# Patient Record
Sex: Female | Born: 1975
Health system: Southern US, Community
[De-identification: ages and names within clinical notes are randomized; demographics above are authoritative.]

## PROBLEM LIST (undated history)

## (undated) DIAGNOSIS — I10 Essential (primary) hypertension: Secondary | ICD-10-CM

## (undated) DIAGNOSIS — F32A Depression, unspecified: Secondary | ICD-10-CM

## (undated) DIAGNOSIS — Z923 Personal history of irradiation: Secondary | ICD-10-CM

## (undated) DIAGNOSIS — J189 Pneumonia, unspecified organism: Secondary | ICD-10-CM

## (undated) DIAGNOSIS — C50919 Malignant neoplasm of unspecified site of unspecified female breast: Secondary | ICD-10-CM

## (undated) DIAGNOSIS — F329 Major depressive disorder, single episode, unspecified: Secondary | ICD-10-CM

## (undated) DIAGNOSIS — E669 Obesity, unspecified: Secondary | ICD-10-CM

## (undated) DIAGNOSIS — J302 Other seasonal allergic rhinitis: Secondary | ICD-10-CM

## (undated) DIAGNOSIS — F419 Anxiety disorder, unspecified: Secondary | ICD-10-CM

## (undated) DIAGNOSIS — K219 Gastro-esophageal reflux disease without esophagitis: Secondary | ICD-10-CM

## (undated) DIAGNOSIS — D573 Sickle-cell trait: Secondary | ICD-10-CM

## (undated) DIAGNOSIS — Z332 Encounter for elective termination of pregnancy: Secondary | ICD-10-CM

## (undated) HISTORY — DX: Depression, unspecified: F32.A

## (undated) HISTORY — PX: PORTACATH PLACEMENT: SHX2246

## (undated) HISTORY — DX: Anxiety disorder, unspecified: F41.9

## (undated) HISTORY — DX: Major depressive disorder, single episode, unspecified: F32.9

## (undated) HISTORY — DX: Malignant neoplasm of unspecified site of unspecified female breast: C50.919

## (undated) HISTORY — DX: Personal history of irradiation: Z92.3

## (undated) HISTORY — DX: Sickle-cell trait: D57.3

## (undated) HISTORY — DX: Obesity, unspecified: E66.9

---

## 1998-09-16 ENCOUNTER — Emergency Department (HOSPITAL_COMMUNITY): Admission: EM | Admit: 1998-09-16 | Discharge: 1998-09-16 | Payer: Self-pay | Admitting: Emergency Medicine

## 1999-02-27 ENCOUNTER — Emergency Department (HOSPITAL_COMMUNITY): Admission: EM | Admit: 1999-02-27 | Discharge: 1999-02-27 | Payer: Self-pay | Admitting: Emergency Medicine

## 1999-06-04 ENCOUNTER — Emergency Department (HOSPITAL_COMMUNITY): Admission: EM | Admit: 1999-06-04 | Discharge: 1999-06-04 | Payer: Self-pay | Admitting: Emergency Medicine

## 2000-01-16 ENCOUNTER — Inpatient Hospital Stay (HOSPITAL_COMMUNITY): Admission: AD | Admit: 2000-01-16 | Discharge: 2000-01-16 | Payer: Self-pay | Admitting: Obstetrics

## 2000-05-14 ENCOUNTER — Emergency Department (HOSPITAL_COMMUNITY): Admission: EM | Admit: 2000-05-14 | Discharge: 2000-05-14 | Payer: Self-pay | Admitting: Emergency Medicine

## 2001-08-04 ENCOUNTER — Inpatient Hospital Stay (HOSPITAL_COMMUNITY): Admission: AD | Admit: 2001-08-04 | Discharge: 2001-08-04 | Payer: Self-pay | Admitting: *Deleted

## 2001-08-11 ENCOUNTER — Inpatient Hospital Stay (HOSPITAL_COMMUNITY): Admission: AD | Admit: 2001-08-11 | Discharge: 2001-08-11 | Payer: Self-pay | Admitting: Obstetrics

## 2001-10-19 ENCOUNTER — Other Ambulatory Visit: Admission: RE | Admit: 2001-10-19 | Discharge: 2001-10-19 | Payer: Self-pay | Admitting: Obstetrics and Gynecology

## 2002-03-10 ENCOUNTER — Inpatient Hospital Stay (HOSPITAL_COMMUNITY): Admission: AD | Admit: 2002-03-10 | Discharge: 2002-03-10 | Payer: Self-pay | Admitting: *Deleted

## 2002-03-10 ENCOUNTER — Encounter: Payer: Self-pay | Admitting: *Deleted

## 2002-03-11 ENCOUNTER — Inpatient Hospital Stay (HOSPITAL_COMMUNITY): Admission: AD | Admit: 2002-03-11 | Discharge: 2002-03-13 | Payer: Self-pay | Admitting: *Deleted

## 2003-05-30 ENCOUNTER — Encounter: Payer: Self-pay | Admitting: Obstetrics & Gynecology

## 2003-05-30 ENCOUNTER — Inpatient Hospital Stay (HOSPITAL_COMMUNITY): Admission: AD | Admit: 2003-05-30 | Discharge: 2003-06-05 | Payer: Self-pay | Admitting: Obstetrics & Gynecology

## 2003-06-01 ENCOUNTER — Encounter: Payer: Self-pay | Admitting: Obstetrics and Gynecology

## 2003-06-10 ENCOUNTER — Inpatient Hospital Stay (HOSPITAL_COMMUNITY): Admission: AD | Admit: 2003-06-10 | Discharge: 2003-06-10 | Payer: Self-pay | Admitting: *Deleted

## 2005-05-29 ENCOUNTER — Inpatient Hospital Stay (HOSPITAL_COMMUNITY): Admission: AD | Admit: 2005-05-29 | Discharge: 2005-05-30 | Payer: Self-pay | Admitting: *Deleted

## 2005-06-06 ENCOUNTER — Inpatient Hospital Stay (HOSPITAL_COMMUNITY): Admission: AD | Admit: 2005-06-06 | Discharge: 2005-06-06 | Payer: Self-pay | Admitting: Obstetrics & Gynecology

## 2006-03-22 ENCOUNTER — Inpatient Hospital Stay (HOSPITAL_COMMUNITY): Admission: AD | Admit: 2006-03-22 | Discharge: 2006-03-23 | Payer: Self-pay | Admitting: Obstetrics and Gynecology

## 2006-04-27 ENCOUNTER — Ambulatory Visit: Payer: Self-pay | Admitting: Gynecology

## 2006-05-04 ENCOUNTER — Ambulatory Visit: Payer: Self-pay | Admitting: *Deleted

## 2006-05-16 ENCOUNTER — Ambulatory Visit: Payer: Self-pay | Admitting: Obstetrics and Gynecology

## 2006-05-16 ENCOUNTER — Inpatient Hospital Stay (HOSPITAL_COMMUNITY): Admission: AD | Admit: 2006-05-16 | Discharge: 2006-05-17 | Payer: Self-pay | Admitting: *Deleted

## 2006-05-17 ENCOUNTER — Ambulatory Visit: Payer: Self-pay | Admitting: Gynecology

## 2006-05-17 ENCOUNTER — Inpatient Hospital Stay (HOSPITAL_COMMUNITY): Admission: AD | Admit: 2006-05-17 | Discharge: 2006-05-20 | Payer: Self-pay | Admitting: Gynecology

## 2006-05-23 ENCOUNTER — Ambulatory Visit: Payer: Self-pay | Admitting: *Deleted

## 2007-02-12 ENCOUNTER — Emergency Department (HOSPITAL_COMMUNITY): Admission: EM | Admit: 2007-02-12 | Discharge: 2007-02-12 | Payer: Self-pay | Admitting: Emergency Medicine

## 2012-05-19 ENCOUNTER — Emergency Department (HOSPITAL_COMMUNITY): Payer: Self-pay

## 2012-05-19 ENCOUNTER — Emergency Department (HOSPITAL_COMMUNITY)
Admission: EM | Admit: 2012-05-19 | Discharge: 2012-05-20 | Disposition: A | Discharge status: Home / Self Care | Payer: Self-pay | Attending: Emergency Medicine | Admitting: Emergency Medicine

## 2012-05-19 ENCOUNTER — Encounter (HOSPITAL_COMMUNITY): Payer: Self-pay | Admitting: *Deleted

## 2012-05-19 DIAGNOSIS — F17200 Nicotine dependence, unspecified, uncomplicated: Secondary | ICD-10-CM | POA: Insufficient documentation

## 2012-05-19 DIAGNOSIS — R079 Chest pain, unspecified: Secondary | ICD-10-CM | POA: Insufficient documentation

## 2012-05-19 DIAGNOSIS — I10 Essential (primary) hypertension: Secondary | ICD-10-CM | POA: Insufficient documentation

## 2012-05-19 HISTORY — DX: Essential (primary) hypertension: I10

## 2012-05-19 LAB — POCT I-STAT, CHEM 8
BUN: 8 mg/dL (ref 6–23)
Calcium, Ion: 1.23 mmol/L (ref 1.12–1.32)
Chloride: 105 mEq/L (ref 96–112)
HCT: 45 % (ref 36.0–46.0)
Potassium: 3.7 mEq/L (ref 3.5–5.1)
Sodium: 144 mEq/L (ref 135–145)

## 2012-05-19 LAB — D-DIMER, QUANTITATIVE: D-Dimer, Quant: 0.57 ug/mL-FEU — ABNORMAL HIGH (ref 0.00–0.48)

## 2012-05-19 LAB — CBC
Platelets: 231 10*3/uL (ref 150–400)
RBC: 4.76 MIL/uL (ref 3.87–5.11)
RDW: 13.8 % (ref 11.5–15.5)
WBC: 16 10*3/uL — ABNORMAL HIGH (ref 4.0–10.5)

## 2012-05-19 LAB — POCT I-STAT TROPONIN I: Troponin i, poc: 0 ng/mL (ref 0.00–0.08)

## 2012-05-19 MED ORDER — IOHEXOL 350 MG/ML SOLN
100.0000 mL | Freq: Once | INTRAVENOUS | Status: AC | PRN
  Administered 2012-05-19: 100 mL via INTRAVENOUS

## 2012-05-19 MED ORDER — IBUPROFEN 600 MG PO TABS: 600.0000 mg | ORAL_TABLET | Freq: Four times a day (QID) | ORAL | Status: AC | PRN

## 2012-05-19 NOTE — Discharge Instructions (Signed)
Follow up with your providers as dicussed in the ED today and as written above.  See your doctor immediately--or return to the ED--with any new or troubling symptoms including fevers, weakness, new chest pain, shortness or breath, numbness, or any other concerning symptom.    Chest Pain (Nonspecific)  It is often hard to give a specific diagnosis for the cause of chest pain. There is always a chance that your pain could be related to something serious, such as a heart attack or a blood clot in the lungs. You need to follow up with your caregiver for further evaluation. CAUSES   Heartburn.   Pneumonia or bronchitis.   Anxiety or stress.   Inflammation around your heart (pericarditis) or lung (pleuritis or pleurisy).   A blood clot in the lung.   A collapsed lung (pneumothorax). It can develop suddenly on its own (spontaneous pneumothorax) or from injury (trauma) to the chest.   Shingles infection (herpes zoster virus).  The chest wall is composed of bones, muscles, and cartilage. Any of these can be the source of the pain.  The bones can be bruised by injury.   The muscles or cartilage can be strained by coughing or overwork.   The cartilage can be affected by inflammation and become sore (costochondritis).  DIAGNOSIS  Lab tests or other studies, such as X-rays, electrocardiography, stress testing, or cardiac imaging, may be needed to find the cause of your pain.  TREATMENT   Treatment depends on what may be causing your chest pain. Treatment may include:   Acid blockers for heartburn.   Anti-inflammatory medicine.   Pain medicine for inflammatory conditions.   Antibiotics if an infection is present.   You may be advised to change lifestyle habits. This includes stopping smoking and avoiding alcohol, caffeine, and chocolate.   You may be advised to keep your head raised (elevated) when sleeping. This reduces the chance of acid going backward from your stomach into your  esophagus.   Most of the time, nonspecific chest pain will improve within 2 to 3 days with rest and mild pain medicine.  HOME CARE INSTRUCTIONS   If antibiotics were prescribed, take your antibiotics as directed. Finish them even if you start to feel better.   For the next few days, avoid physical activities that bring on chest pain. Continue physical activities as directed.   Do not smoke.   Avoid drinking alcohol.   Only take over-the-counter or prescription medicine for pain, discomfort, or fever as directed by your caregiver.   Follow your caregiver's suggestions for further testing if your chest pain does not go away.   Keep any follow-up appointments you made. If you do not go to an appointment, you could develop lasting (chronic) problems with pain. If there is any problem keeping an appointment, you must call to reschedule.  SEEK MEDICAL CARE IF:   You think you are having problems from the medicine you are taking. Read your medicine instructions carefully.   Your chest pain does not go away, even after treatment.   You develop a rash with blisters on your chest.  SEEK IMMEDIATE MEDICAL CARE IF:   You have increased chest pain or pain that spreads to your arm, neck, jaw, back, or abdomen.   You develop shortness of breath, an increasing cough, or you are coughing up blood.   You have severe back or abdominal pain, feel nauseous, or vomit.   You develop severe weakness, fainting, or chills.   You   have a fever.  THIS IS AN EMERGENCY. Do not wait to see if the pain will go away. Get medical help at once. Call your local emergency services (911 in [place] Do not drive yourself to the hospital. MAKE SURE YOU:   Understand these instructions.   Will watch your condition.   Will get help right away if you are not doing well or get worse.  Document Released: 08/31/2005 Document Revised: 11/10/2011 Document Reviewed: 06/26/2008 [employer] Patient Information 2012 [employer]. 

## 2012-05-19 NOTE — ED Notes (Signed)
[facility] and her left chest started to feel tight and she did experience nausea and shortness of breath.  Patient is pain free at this time.  Patient also mentioned that her nose stated to bleed.  Has history of nosebleed and uncontrolled HTN

## 2012-05-19 NOTE — ED Provider Notes (Signed)
History     CSN: [id number]  Arrival date & time 05/19/12  1729   None     Chief Complaint  Patient presents with  . Chest Pain    (Consider location/radiation/quality/duration/timing/severity/associated sxs/prior treatment) HPI  36 year old female past medical history of hypertension presents today with a acute onset of chest tightness and shortness of breath and nausea. Onset was during walking approximately 5 hours before arrival. It the discomfort lasted for about one hour and her source of breath was better with resting. She denied a jaw pain she endorsed some right arm tingling but she's had that for years. After the initial onset she had one round of emesis. She is symptom-free at this time.   Past Medical History  Diagnosis Date  . Hypertension     History reviewed. No pertinent past surgical history.  History reviewed. No pertinent family history.  History  Substance Use Topics  . Smoking status: Current Everyday Smoker -- 0.5 packs/day    Types: Cigarettes  . Smokeless tobacco: Not on file  . Alcohol Use: No    OB History    Grav Para Term Preterm Abortions TAB SAB Ect Mult Living                  Review of Systems Constitutional: Negative for fever and chills.  HENT: Negative for ear pain, sore throat and trouble swallowing.   Eyes: Negative for pain and visual disturbance.  Respiratory: Negative for cough and POS shortness of breath.   Cardiovascular: POS for chest pain and leg swelling.  Gastrointestinal: Negative for nausea, vomiting, abdominal pain and diarrhea.  Genitourinary: Negative for dysuria, urgency and frequency.  Musculoskeletal: Negative for back pain and joint swelling.  Skin: Negative for rash and wound.  Neurological: Negative for dizziness, syncope, speech difficulty, weakness and numbness.   Allergies  Review of patient's allergies indicates no known allergies.  Home Medications   Current Outpatient Rx  Name Route Sig Dispense  Refill  . IBUPROFEN 600 MG PO TABS Oral Take 1 tablet (600 mg total) by mouth every 6 (six) hours as needed for pain. 15 tablet 0    BP 120/84  Pulse 100  Temp 98.8 F (37.1 C) (Oral)  Resp 16  Ht 5\' 2"  (1.575 m)  Wt 224 lb (101.606 kg)  BMI 40.97 kg/m2  SpO2 97%  LMP 04/29/2012  Physical Exam Consitutional: Pt in no acute distress.   Head: Normocephalic and atraumatic.  Eyes: Extraocular motion intact, no scleral icterus Neck: Supple without meningismus, mass, or overt JVD Respiratory: Effort normal and breath sounds normal. No respiratory distress. CV: Heart regular rate and regular rhythm (sinus), no obvious murmurs.  Pulses +2 and symmetric Abdomen: Soft, non-tender, non-distended. No rebound or guarding.  MSK: Extremities are atraumatic without deformity, ROM intact Skin: Warm, dry, intact Neuro: Alert and oriented, no motor deficit noted.   Psychiatric: Mood and affect are normal  EKG:  Rate: 98 Rythym Sinus  Interval 134  ms. Axis: borderline LAD No gross conduction abnormalities appreciated.  No gross ST change.  Some t wave flattening III, V3 No previous.   ED Course  Procedures (including critical care time)  Labs Reviewed  CBC - Abnormal; Notable for the following:    WBC 16.0 (*)     All other components within normal limits  POCT I-STAT, CHEM 8 - Abnormal; Notable for the following:    Hemoglobin 15.3 (*)     All other components within normal limits  D-DIMER, QUANTITATIVE - Abnormal; Notable for the following:    D-Dimer, Quant 0.57 (*)     All other components within normal limits  POCT I-STAT TROPONIN I   Dg Chest 2 View  05/19/2012  *RADIOLOGY REPORT*  Clinical Data: Chest pain and pressure; shortness of breath.  CHEST - 2 VIEW  Comparison: None.  Findings: The lungs are well-aerated and clear.  There is no evidence of focal opacification, pleural effusion or pneumothorax.  The heart is normal in size; the mediastinal contour is within normal  limits.  No acute osseous abnormalities are seen.  IMPRESSION: No acute cardiopulmonary process seen.  Original Report Authenticated By: Tonia Ghent, M.D.   Ct Angio Chest W/cm &/or Wo Cm  05/19/2012  *RADIOLOGY REPORT*  Clinical Data: Mid chest pain and shortness of breath; elevated D- dimer.  CT ANGIOGRAPHY CHEST  Technique:  Multidetector CT imaging of the chest using the standard protocol during bolus administration of intravenous contrast. Multiplanar reconstructed images including MIPs were obtained and reviewed to evaluate the vascular anatomy.  Contrast: OMNIPAQUE IOHEXOL 350 MG/ML SOLN  Comparison: Chest radiograph performed earlier today at 09:34 p.m.  Findings: There is no evidence of pulmonary embolus.  The lungs are clear bilaterally, aside from minimal right basilar atelectasis.  There is no evidence of significant focal consolidation, pleural effusion or pneumothorax.  No masses are identified; no abnormal focal contrast enhancement is seen.  The mediastinum is unremarkable in appearance.  No mediastinal lymphadenopathy is seen.  No pericardial effusion is identified. The great vessels are unremarkable in appearance.  No axillary lymphadenopathy is seen.  The visualized portions of the thyroid gland are unremarkable in appearance.  The visualized portions of the liver and spleen are unremarkable.  No acute osseous abnormalities are seen.  IMPRESSION:  1.  No evidence of pulmonary embolus. 2.  Minimal right basilar atelectasis; lungs otherwise clear.  Original Report Authenticated By: Tonia Ghent, M.D.     1. Chest pain       MDM  36 year old female with very few risk factors for acute coronary syndrome. TIMI 0. Troponin drawn by triage. Pain for just over an hour, negative troponin.  Essentially normal EKG.  We'll not workup ACS any further. However right leg swelling and story concerning for PE. D-dimer test drawn, positive. CT chest, negative. Patient pain and shortness of  breath had resolved on arrival; the patient is still pain free without any symptoms.    Discharge home to follow up with her provider.  PT DC home stable.  Discussed with pt the clinical impression, treatment in the ED, and follow up plan.  We alslo discussed the indications for returning to the ED, which include shortness or breath, confusion, fever, new weakness or numbness, chest pain, or any other concerning symptom.  The pt understood the treatment and plan, is stable, and is able to leave the ED.           Larrie Kass, MD 05/20/12 0010

## 2012-05-20 NOTE — ED Provider Notes (Signed)
  I performed a history and physical examination of Rebecca [name] and discussed her management with Dr. Rainey Pines.  I agree with the history, physical, assessment, and plan of care, with the following exceptions: None  On my evaluation the patient was resting comfortably, in no distress.  The patient's right leg was minimally in enlarged compared to her left.  Patient's vital signs are stable.  I also saw the ECG and agree with the interpretation.  The patient was discharged in stable condition to follow up with her primary care physician.  Elyse Jarvis, MD 05/20/12 [phone]

## 2014-08-14 ENCOUNTER — Emergency Department (HOSPITAL_COMMUNITY)
Admission: EM | Admit: 2014-08-14 | Discharge: 2014-08-14 | Disposition: A | Discharge status: Home / Self Care | Payer: Medicaid Other | Attending: Emergency Medicine | Admitting: Emergency Medicine

## 2014-08-14 ENCOUNTER — Encounter (HOSPITAL_COMMUNITY): Payer: Self-pay | Admitting: Emergency Medicine

## 2014-08-14 DIAGNOSIS — M7989 Other specified soft tissue disorders: Secondary | ICD-10-CM | POA: Diagnosis present

## 2014-08-14 DIAGNOSIS — I10 Essential (primary) hypertension: Secondary | ICD-10-CM | POA: Diagnosis not present

## 2014-08-14 DIAGNOSIS — F17200 Nicotine dependence, unspecified, uncomplicated: Secondary | ICD-10-CM | POA: Diagnosis not present

## 2014-08-14 DIAGNOSIS — L97809 Non-pressure chronic ulcer of other part of unspecified lower leg with unspecified severity: Secondary | ICD-10-CM | POA: Insufficient documentation

## 2014-08-14 DIAGNOSIS — L98491 Non-pressure chronic ulcer of skin of other sites limited to breakdown of skin: Secondary | ICD-10-CM

## 2014-08-14 LAB — CBG MONITORING, ED: Glucose-Capillary: 90 mg/dL (ref 70–99)

## 2014-08-14 MED ORDER — HYDROCODONE-ACETAMINOPHEN 5-325 MG PO TABS
1.0000 | ORAL_TABLET | ORAL | Status: DC | PRN
Start: 2014-08-14 — End: 2016-01-13

## 2014-08-14 MED ORDER — CEPHALEXIN 500 MG PO CAPS: 500.0000 mg | ORAL_CAPSULE | Freq: Three times a day (TID) | ORAL | Status: DC

## 2014-08-14 MED ORDER — SULFAMETHOXAZOLE-TRIMETHOPRIM 800-160 MG PO TABS: 1.0000 | ORAL_TABLET | Freq: Two times a day (BID) | ORAL | Status: DC

## 2014-08-14 NOTE — Discharge Instructions (Signed)
Please follow up for a wound check Skin Ulcer A skin ulcer is an open sore that can be shallow or deep. Skin ulcers sometimes become infected and are difficult to treat. It may be 1 month or longer before real healing progress is made. CAUSES   Injury.  Problems with the veins or arteries.  Diabetes.  Insect bites.  Bedsores.  Inflammatory conditions. SYMPTOMS   Pain, redness, swelling, and tenderness around the ulcer.  Fever.  Bleeding from the ulcer.  Yellow or clear fluid coming from the ulcer. DIAGNOSIS  There are many types of skin ulcers. Any open sores will be examined. Certain tests will be done to determine the kind of ulcer you have. The right treatment depends on the type of ulcer you have. TREATMENT  Treatment is a long-term challenge. It may include:  Wearing an elastic wrap, compression stockings, or gel cast over the ulcer area.  Taking antibiotic medicines or putting antibiotic creams on the affected area if there is an infection. HOME CARE INSTRUCTIONS  Put on your bandages (dressings), wraps, or casts over the ulcer as directed by your caregiver.  Change all dressings as directed by your caregiver.  Take all medicines as directed by your caregiver.  Keep the affected area clean and dry.  Avoid injuries to the affected area.  Eat a well-balanced, healthy diet that includes plenty of fruit and vegetables.  If you smoke, consider quitting or decreasing the amount of cigarettes you smoke.  Once the ulcer heals, get regular exercise as directed by your caregiver.  Work with your caregiver to make sure your blood pressure, cholesterol, and diabetes are well-controlled.  Keep your skin moisturized. Dry skin can crack and lead to skin ulcers. SEEK IMMEDIATE MEDICAL CARE IF:   Your pain gets worse.  You have swelling, redness, or fluids around the ulcer.  You have chills.  You have a fever. MAKE SURE YOU:   Understand these  instructions.  Will watch your condition.  Will get help right away if you are not doing well or get worse. Document Released: 12/29/2004 Document Revised: 02/13/2012 Document Reviewed: 07/08/2011 [facility] Patient Information 2015 [place], [place]. This information is not intended to replace advice given to you by your health care provider. Make sure you discuss any questions you have with your health care provider.

## 2014-08-14 NOTE — ED Notes (Signed)
Pt reports she was bitten by a spider . Area to Rt lower leg red an swollen .

## 2014-08-14 NOTE — ED Provider Notes (Signed)
CSN: [id number]     Arrival date & time 08/14/14  0950 History   This chart was scribed for non-physician practitioner Margarita Mail working with Houston Siren III, * by Donato Schultz, ED Scribe. This patient was seen in room TR07C/TR07C and the patient's care was started at 10:33 AM.     Chief Complaint  Patient presents with  . Insect Bite    HPI HPI Comments: Rebecca [name] is a 38 y.o. female who presents to the Emergency Department complaining of a painful spider bite located on her right lower leg she noticed a week ago.  She states that she felt something crawling up her leg at that time and brushed it off.  Shortly thereafter she felt some pain and inflammation to her right lower leg.  She denies fever, chills, and myalgias as associated symptoms.  She has applied topical Neosporin to the area with no relief to her symptoms.  She has not experienced these symptoms in the past.     Past Medical History  Diagnosis Date  . Hypertension    Past Surgical History  Procedure Laterality Date  . Cesarean section      2003   History reviewed. No pertinent family history. History  Substance Use Topics  . Smoking status: Current Every Day Smoker -- 0.50 packs/day    Types: Cigarettes  . Smokeless tobacco: Never Used  . Alcohol Use: No   OB History   Grav Para Term Preterm Abortions TAB SAB Ect Mult Living                 Review of Systems  Constitutional: Negative for fever and chills.  Musculoskeletal: Negative for myalgias.  Skin: Positive for wound.      Allergies  Review of patient's allergies indicates no known allergies.  Home Medications   Prior to Admission medications   Not on File   BP 106/71  Pulse 81  Temp(Src) 97.7 F (36.5 C) (Oral)  Resp 12  Ht 5' 1.5" (1.562 m)  Wt 224 lb (101.606 kg)  BMI 41.64 kg/m2  SpO2 99%  LMP 08/14/2014  Physical Exam  Nursing note and vitals reviewed. Constitutional: She is oriented to person, place, and time. She  appears well-developed and well-nourished.  HENT:  Head: Normocephalic and atraumatic.  Eyes: EOM are normal.  Neck: Normal range of motion.  Cardiovascular: Normal rate.   Pulmonary/Chest: Effort normal.  Musculoskeletal: Normal range of motion.  Neurological: She is alert and oriented to person, place, and time.  Skin: Skin is warm and dry.  Psychiatric: She has a normal mood and affect. Her behavior is normal.      ED Course  Wound repair Date/Time: 08/18/2014 9:48 PM Performed by: Margarita Mail Authorized by: Margarita Mail Consent: Verbal consent obtained. Risks and benefits: risks, benefits and alternatives were discussed Patient identity confirmed: verbally with patient Time out: Immediately prior to procedure a "time out" was called to verify the correct patient, procedure, equipment, support staff and site/side marked as required. Preparation: Patient was prepped and draped in the usual sterile fashion. Local anesthesia used: yes Local anesthetic: lidocaine 1% with epinephrine (9:1 lidocaine  and  4 % bicarb) Anesthetic total: 4 ml Patient sedated: no Patient tolerance: Patient tolerated the procedure well with no immediate complications.   (including critical care time)  DIAGNOSTIC STUDIES: Oxygen Saturation is 99% on room air, normal by my interpretation.    COORDINATION OF CARE: 10:35 AM- Discussed starting the patient on an antibiotic  and doing an ultrasound of her right lower leg to determine if there is any fluid present under the wound.  The patient agreed to the treatment plan.    Labs Review Labs Reviewed - No data to display  Imaging Review No results found.   EKG Interpretation None      MDM   Final diagnoses:  Skin ulcer, limited to breakdown of skin    Patient with skin ulcer.  Wound was anesthetized and debrided. No signs of infeciton. Normal pulseses and cbg wnl.   I personally performed the services described in this  documentation, which was scribed in my presence. The recorded information has been reviewed and is accurate.    Margarita Mail, PA-C 08/18/14 2151

## 2014-08-19 NOTE — ED Provider Notes (Signed)
Medical screening examination/treatment/procedure(s) were performed by non-physician practitioner and as supervising physician I was immediately available for consultation/collaboration.   Houston Siren III, MD 08/19/14 1700

## 2015-10-13 ENCOUNTER — Emergency Department (HOSPITAL_COMMUNITY)
Admission: EM | Admit: 2015-10-13 | Discharge: 2015-10-13 | Disposition: A | Discharge status: Home / Self Care | Payer: Medicaid Other | Attending: Emergency Medicine | Admitting: Emergency Medicine

## 2015-10-13 ENCOUNTER — Encounter (HOSPITAL_COMMUNITY): Payer: Self-pay

## 2015-10-13 DIAGNOSIS — N6310 Unspecified lump in the right breast, unspecified quadrant: Secondary | ICD-10-CM

## 2015-10-13 DIAGNOSIS — Z720 Tobacco use: Secondary | ICD-10-CM | POA: Diagnosis not present

## 2015-10-13 DIAGNOSIS — Z792 Long term (current) use of antibiotics: Secondary | ICD-10-CM | POA: Insufficient documentation

## 2015-10-13 DIAGNOSIS — N63 Unspecified lump in breast: Secondary | ICD-10-CM | POA: Diagnosis present

## 2015-10-13 DIAGNOSIS — I10 Essential (primary) hypertension: Secondary | ICD-10-CM | POA: Insufficient documentation

## 2015-10-13 MED ORDER — CEPHALEXIN 500 MG PO CAPS: 500.0000 mg | ORAL_CAPSULE | Freq: Four times a day (QID) | ORAL | Status: DC

## 2015-10-13 NOTE — ED Provider Notes (Signed)
CSN: [id number]     Arrival date & time 10/13/15  1236 History  By signing my name below, I, Erling Conte, attest that this documentation has been prepared under the direction and in the presence of Carlos Levering, PA-C Electronically Signed: Erling Conte, ED Scribe. 10/13/2015. 1:25 PM.    Chief Complaint  Patient presents with  . Breast Mass    The history is provided by the patient. No language interpreter was used.    HPI Comments: Rebecca [name] is a 39 y.o. female with a h/o HTN who presents to the Emergency Department complaining of lump in her right breast onset 2 weeks. She reports associated pain and mild itching in her right breast. She states she has not had a mammogram in over 10 years. Pt has not had any medications prior to arrival. Pt reports the pain in her right breast is exacerbated with touch. She states that breast cancer runs in her family and notes her maternal grandmother died from breast cancer and was diagnosed in her 64s. Pt denies any h/o lumps in her breasts. She states she gives herself daily breast checks. Pt endorses that she has 4 children and her last child was born when she was 12 y.o. She reports her LNMP was 10/06/15. She denies any fevers, nipple discharge, abdominal pain, abdominal distension, night sweats, or unexplained weight loss.   Past Medical History  Diagnosis Date  . Hypertension    Past Surgical History  Procedure Laterality Date  . Cesarean section      2003   No family history on file. Social History  Substance Use Topics  . Smoking status: Current Every Day Smoker -- 0.50 packs/day    Types: Cigarettes  . Smokeless tobacco: Never Used  . Alcohol Use: No   OB History    No data available     Review of Systems  All other systems reviewed and are negative.     Allergies  Review of patient's allergies indicates no known allergies.  Home Medications   Prior to Admission medications   Medication Sig Start Date End  Date Taking? Authorizing Provider  cephALEXin (KEFLEX) 500 MG capsule Take 1 capsule (500 mg total) by mouth 3 (three) times daily. 08/14/14   Margarita Mail, PA-C  HYDROcodone-acetaminophen (NORCO) 5-325 MG per tablet Take 1-2 tablets by mouth every 4 (four) hours as needed. 08/14/14   Margarita Mail, PA-C  sulfamethoxazole-trimethoprim (SEPTRA DS) 800-160 MG per tablet Take 1 tablet by mouth every 12 (twelve) hours. 08/14/14   Margarita Mail, PA-C   Triage Vitals: BP 126/83 mmHg  Pulse 84  Temp(Src) 98.3 F (36.8 C) (Oral)  Resp 18  SpO2 100%  Physical Exam  Constitutional: She is oriented to person, place, and time. She appears well-developed and well-nourished. No distress.  HENT:  Head: Normocephalic and atraumatic.  Eyes: Conjunctivae are normal. Right eye exhibits no discharge. Left eye exhibits no discharge. No scleral icterus.  Cardiovascular: Normal rate.   Pulmonary/Chest: Effort normal. Right breast exhibits mass and tenderness. Right breast exhibits no inverted nipple, no nipple discharge and no skin change. Left breast exhibits no inverted nipple, no mass, no nipple discharge, no skin change and no tenderness. Breasts are symmetrical.    Genitourinary: Uterus normal.  Neurological: She is alert and oriented to person, place, and time. Coordination normal.  Skin: Skin is warm and dry. No rash noted. She is not diaphoretic. No erythema. No pallor.  Psychiatric: She has a normal mood and affect.  Her behavior is normal.  Nursing note and vitals reviewed.   ED Course  Procedures (including critical care time)  DIAGNOSTIC STUDIES: Oxygen Saturation is 100% on RA, normal by my interpretation.    COORDINATION OF CARE: 1:45 PM- Will provide pt with referral for outpatient [place]. Will also give her rx for Keflex. Pt advised of plan for treatment and pt agrees.  Labs Review Labs Reviewed - No data to display  Imaging Review No results found. I have personally reviewed and  evaluated these images and lab results as part of my medical decision-making.   EKG Interpretation None      MDM   Final diagnoses:  Breast mass, right    39 year old otherwise healthy female presents with tender mass in her right breast. Patient noticed this last 2 weeks ago. It is becoming increasingly tender. No skin changes or galactorrhea. No unintentional weight loss or night sweats. Positive family history for breast cancer, maternal grandmother. Recommend that patient get complete ultrasound of this mass. We'll refer patient to [facility] as she does not a primary care.   Mass does not feel fluctuant on physical exam. However as the area is quite tender Will prophylactically give antibiotics. Discussed treatment plan patient is agreeable. Patient stable for discharge. Return precautions outlined in patient discharge instructions. I personally performed the services described in this documentation, which was scribed in my presence. The recorded information has been reviewed and is accurate.      Dondra Spry [place], PA-C 10/14/15 1751  Daleen Bo, MD 10/15/15 [phone]

## 2015-10-13 NOTE — ED Notes (Signed)
Right breast mass, upper right side of breast. Onset 2 weeks ago. States lump is tender.

## 2015-10-13 NOTE — Discharge Instructions (Signed)
Breast Biopsy A breast biopsy is a procedure where a sample of breast tissue is removed from your breast. The tissue is examined under a microscope to see if cancerous cells are present. A breast biopsy is done when there is:  Any undiagnosed breast mass (tumor).  Nipple abnormalities, dimpling, crusting, or ulcerations.  Abnormal discharge from the nipple, especially blood.  Redness, swelling, and pain of the breast.  Calcium deposits (calcifications) or abnormalities seen on a mammogram, ultrasound result, or results of magnetic resonance imaging (MRI).  Suspicious changes in the breast seen on your mammogram. If the tumor is found to be cancerous (malignant), a breast biopsy can help to determine what the best treatment is for you. There are many different types of breast biopsies. Talk to your caregiver about your options and which type is best for you. LET YOUR CAREGIVER KNOW ABOUT:  Allergies to food or medicine.  Medicines taken, including vitamins, herbs, eyedrops, over-the-counter medicines, and creams.  Use of steroids (by mouth or creams).  Previous problems with anesthetics or numbing medicines.  History of bleeding problems or blood clots.  Previous surgery.  Other health problems, including diabetes and kidney problems.  Any recent colds or infections.  Possibility of pregnancy, if this applies. RISKS AND COMPLICATIONS   Bleeding.  Infection.  Allergy to medicines.  Bruising and swelling of the breast.  Alteration in the shape of the breast.  Not finding the lump or abnormality.  Needing more surgery. BEFORE THE PROCEDURE  Arrange for someone to drive you home after the procedure.  Do not smoke for 2 weeks before the procedure. Stop smoking, if you smoke.  Do not drink alcohol for 24 hours before procedure.  Wear a good support bra to the procedure.  Your health care provider may perform a procedure to place a wire (needle localization) or a  seed that gives off radiation (radioactive seed localization) in the breast lump. A mammogram or ultrasound is done during this procedure to help with proper placement. The wire or seed will help the health care provider locate the lump when performing the biopsy, especially if the lump cannot be felt. PROCEDURE  You may be given a medicine to numb the breast area (local anesthesia) or a medicine to make you sleep (general anesthesia) during the procedure. The following are the different types of biopsies that can be performed.   Fine-needle aspiration--A thin needle is attached to a syringe and inserted into the breast lump. Fluid and cells are removed and then looked at under a microscope. If the breast lump cannot be felt, an ultrasound may be used to help locate the lump and place the needle in the correct area.   Core needle biopsy--A wide, hollow needle (core needle) is inserted into the breast lump 3-6 times to get tissue samples or cores. The samples are removed. The needle is usually placed in the correct area by using an ultrasound or X-ray.   Stereotactic biopsy--X-ray equipment and a computer are used to analyze X-ray pictures of the breast lump. The computer then finds exactly where the core needle needs to be inserted. Tissue samples are removed.   Vacuum-assisted biopsy--A small incision (less than  inch) is made in your breast. A biopsy device that includes a hollow needle and vacuum is passed through the incision and into the breast tissue. The vacuum gently draws abnormal breast tissue into the needle to remove it. This type of biopsy removes a larger tissue sample than a regular  core needle biopsy. No stitches are needed, and there is usually little scarring.  Ultrasound-guided core needle biopsy--A high frequency ultrasound helps guide the core needle to the area of the mass or abnormality. An incision is made to insert the needle. Tissue samples are removed.  Open biopsy--A  larger incision is made in the breast. Your caregiver will attempt to remove the whole breast lump or as much as possible. AFTER THE PROCEDURE  You will be taken to the recovery area. If you are doing well and have no problems, you will be allowed to go home.  You may notice bruising on your breast. This is normal.  Your caregiver may apply a pressure dressing on your breast for 24-48 hours. A pressure dressing is a bandage that is wrapped tightly around the chest to stop fluid from collecting underneath tissues.   This information is not intended to replace advice given to you by your health care provider. Make sure you discuss any questions you have with your health care provider.   Document Released: 11/21/2005 Document Revised: 08/12/2015 Document Reviewed: 12/22/2011 Elsevier Interactive Patient Education 2016 [name] Breast self-awareness allows you to notice a breast problem early while it is still small. Do a breast self-exam:  Every month, 5-7 days after your period (menstrual period).  At the same time each month if you do not have periods anymore. Look for any:  Difference between your breasts (size, shape, or position).  Change in breast shape or size.  Fluid or blood coming from your nipples.  Changes in your nipples (dimpling, nipple movement).   Change in skin color or texture (redness, scaly areas). Feel for:  Lumps.  Bumps.  Dips.  Any other changes. HOW TO DO A BREAST SELF-EXAM Look at your breasts and nipples.  Take off all your clothes above your waist.  Stand in front of a mirror in a room with good lighting.  Put your hands on your hips and push your hands downward. Feel your breasts.   Lie flat on your back or stand in the shower or tub. If you are in the shower or tub, have wet, soapy hands.  Place your right arm above your head.  Place your left hand in the right underarm area.  Make small circles using the  pads (not the fingertips) of your 3 middle fingers. Press lightly and then with medium and firm pressure.  Move your fingers a little lower and make the small circles at the 3 pressures (light, medium, and firm).  Continue moving your fingers lower and making circles until you reach the bottom of your breast.  Move your fingers one finger-width towards the center of the body.  Continue making the circles, this time moving upward until you reach the bottom of your neck.  Move your fingers one finger-width towards the center of your body.  Make circles downward when starting at the bottom of the neck. Make circles upward when starting at the bottom of the breast. Stop when you reach the middle of the chest.   Repeat these steps on the other breast. Write down what looks and feels normal for each breast. Also write down any changes you notice. GET HELP RIGHT AWAY IF:  You see any changes in your breasts or nipples.  You see skin changes.  You have unusual discharge from your nipples.  You feel a new lump.  You feel unusually thick areas.   This information is not intended to  replace advice given to you by your health care provider. Make sure you discuss any questions you have with your health care provider.   Document Released: 05/09/2008 Document Revised: 11/07/2012 Document Reviewed: 03/07/2012 Elsevier Interactive Patient Education 2016 [place] is a type of breast tumor that is not cancerous (is benign). These tumors are made up of breast tissue and the tissue that holds breast tissue together (connective tissue). There are several types of fibroadenomas:  Simple fibroadenoma. This is the most common type. It consists of a single type of tissue throughout the tumor.  Complex fibroadenoma. This type of tumor contains more than one kind of tissue or irregular tissue.  Juvenile fibroadenoma. This is a type of tumor that can develop in adolescent  girls. It tends to grow larger over time than other adenomas. A fibroadenoma usually occurs as a single lump, but sometimes there may be more than one lump. Fibroadenomas vary in size. They can occur in one breast or in both breasts. Some fibroadenomas are too small to feel, but a larger one may feel like a firm, smooth lump that moves beneath your fingers. Although fibroadenomas are not cancer, having a fibroadenoma may slightly increase your risk for developing breast cancer in the future. CAUSES The exact cause of fibroadenoma is not known. RISK FACTORS This condition is more likely to develop in:  Women who are 39-72 years of age.  Women of African-American descent. SYMPTOMS A fibroadenoma may not cause any symptoms. These tumors usually do not cause pain unless they grow to a large size. A fibroadenoma may feel like a lump in your breast that is:  Firm.  Round.  Smooth.  Slightly moveable. DIAGNOSIS You may notice a breast lump during a breast self-exam. Your health care provider may discover it during a routine breast exam or mammogram. Your health care provider may suspect fibroadenoma if you have a breast lump that feels firm, round, and smooth and appears smooth on your mammogram. Other tests may be done to confirm the diagnosis, including:  An ultrasound to check for fluid inside the lump (cystic tumor).  A procedure that uses a needle to remove fluid from a cystic tumor. The fluid is then checked under a microscope for cancer cells.  A mammogram to examine a lump that is not cystic (is solid).  A procedure that uses a needle to remove a sample of tissue from the lump (breast biopsy) to examine under a microscope. This test is the only method that can be used to confirm that a tumor is a fibroadenoma and is not cancer. TREATMENT Treatment for this condition may include:  Having breast exams regularly to check for changes in your fibroadenoma.  Having the fibroadenoma  removed. A fibroadenoma may be removed if it is:  Large.  Continuing to grow.  Causing symptoms.  Changing the appearance of your breast.  A juvenile fibroadenoma. These tend to grow large over time. HOME CARE INSTRUCTIONS  If you had a fibroadenoma removed, follow instructions from your health care provider for care after the procedure.  Perform breast self-exams at home as told by your health care provider.  Keep all follow-up visits as told by your health care provider. This is important. SEEK MEDICAL CARE IF:  Your fibroadenoma becomes larger, feels different, or becomes painful.  You find a new breast lump.  You have any changes in the skin that covers your breast. These include:  Dimpling.  Bruising.  Thickening.  Redness.  You have any changes in your nipple.  You have fluid leaking from your nipple.   This information is not intended to replace advice given to you by your health care provider. Make sure you discuss any questions you have with your health care provider.   Document Released: 04/07/2015 Document Reviewed: 04/07/2015 Elsevier Interactive Patient Education 2016 Else[employer] Inc[employer]  Breast Cyst A breast cyst is a sac in the breast that is filled with fluid. Breast cysts are common in women. Women can have one or many cysts. When the breasts contain many cysts, it is usually due to a noncancerous (benign) condition called fibrocystic change. These lumps form under the influence of female hormones (estrogen and progesterone). The lumps are most often located in the upper, outer portion of the breast. They are often more swollen, painful, and tender before your period starts. They usually disappear after menopause, unless you are on hormone therapy.  There are several types of cysts:  Macrocyst. This is a cyst that is about 2 in. (5.1 cm) in diameter.   Microcyst. This is a tiny cyst that you cannot feel but can be seen with a mammogram or an ultrasound.    Galactocele. This is a cyst containing milk that may develop if you suddenly stop breastfeeding.   Sebaceous cyst of the skin. This type of cyst is not in the breast tissue itself. Breast cysts do not increase your risk of breast cancer. However, they must be monitored closely because they can be cancerous.  CAUSES  It is not known exactly what causes a breast cyst to form. Possible causes include:  An overgrowth of milk glands and connective tissue in the breast can block the milk glands, causing them to fill with fluid.   Scar tissue in the breast from previous surgery may block the glands, causing a cyst.  RISK FACTORS Estrogen may influence the development of a breast cyst.  SIGNS AND SYMPTOMS   Feeling a smooth, round, soft lump (like a grape) in the breast that is easily moveable.   Breast discomfort or pain.  Increase in size of the lump before your menstrual period and decrease in its size after your menstrual period.  DIAGNOSIS  A cyst can be felt during a physical exam by your health care provider. A breast X-ray exam (mammogram) and ultrasonography will be done to confirm the diagnosis. Fluid may be removed from the cyst with a needle (fine needle aspiration) to make sure the cyst is not cancerous.  TREATMENT  Treatment may not be necessary. Your health care provider may monitor the cyst to see if it goes away on its own. If treatment is needed, it may include:  Hormone treatment.   Needle aspiration. There is a chance of the cyst coming back after aspiration.   Surgery to remove the whole cyst.  HOME CARE INSTRUCTIONS   Keep all follow-up appointments with your health care provider.  See your health care provider regularly:  Get a yearly exam by your health care provider.  Have a clinical breast exam by a health care provider every 1-3 years if you are 43-71 years of age. After age 41 years, you should have the exam every year.   Get mammogram tests  as directed by your health care provider.   Understand the normal appearance and feel of your breasts and perform breast self-exams.   Only take over-the-counter or prescription medicines as directed by your health care provider.   Wear a supportive bra,  especially when exercising.   Avoid caffeine.   Reduce your salt intake, especially before your menstrual period. Too much salt can cause fluid retention, breast swelling, and discomfort.  SEEK MEDICAL CARE IF:   You feel, or think you feel, a lump in your breast.   You notice that both breasts look or feel different than usual.   Your breast is still causing pain after your menstrual period is over.   You need medicine for breast pain and swelling that occurs with your menstrual period.  SEEK IMMEDIATE MEDICAL CARE IF:   You have severe pain, tenderness, redness, or warmth in your breast.   You have nipple discharge or bleeding.   Your breast lump becomes hard and painful.   You find new lumps or bumps that were not there before.   You feel lumps in your armpit (axilla).   You notice dimpling or wrinkling of the breast or nipple.   You have a fever.  MAKE SURE YOU:  Understand these instructions.  Will watch your condition.  Will get help right away if you are not doing well or get worse.   This information is not intended to replace advice given to you by your health care provider. Make sure you discuss any questions you have with your health care provider.  Follow up with the breast center as soon as possible for evaluation of breast mass. Will need ultrasound and potential biopsy to evaluate. Return to the Emergency Department if you experience fever, worsening breast pain, nipple discharge, unintentional weight loss or night sweats.

## 2015-10-13 NOTE — ED Notes (Signed)
Pt reports lump in right breast that is painful with palpation and itching. Denies discharge. Denies taking anything for pain. Denies CP.

## 2015-11-23 ENCOUNTER — Ambulatory Visit: Payer: Medicaid Other | Attending: Family Medicine

## 2015-12-18 ENCOUNTER — Other Ambulatory Visit: Payer: Self-pay

## 2015-12-18 DIAGNOSIS — N6310 Unspecified lump in the right breast, unspecified quadrant: Secondary | ICD-10-CM

## 2015-12-24 ENCOUNTER — Encounter: Payer: Self-pay | Admitting: *Deleted

## 2015-12-24 NOTE — Progress Notes (Signed)
Rebecca Putman J. Rebecca Laming, RN, BSN, [place] [phone] [facility] consulted to assist with pt obtaining referral for breast mass imaging appointment. Pt is not currently checking into hospital, states she was told by [name] [phone] to go to Urgent Care or ER to obtain referral.  [facility] explained that without being physical pt, ED would not be able to write that referral.  Pt states she does not have PCP but just gained insurance via Medicaid. Elsie set up appointment with Cammie Sickle, NP on 01/12/16 at 1015.  Spoke with pt in lobby and provided brochure with directions and phone number highlighted.  Pt verbalizes understanding of keeping appointment.

## 2016-01-12 ENCOUNTER — Ambulatory Visit (INDEPENDENT_AMBULATORY_CARE_PROVIDER_SITE_OTHER): Payer: Medicaid Other | Admitting: Family Medicine

## 2016-01-12 ENCOUNTER — Encounter: Payer: Self-pay | Admitting: Family Medicine

## 2016-01-12 VITALS — BP 111/75 | HR 90 | Temp 98.4°F | Resp 16 | Ht 61.0 in | Wt 184.0 lb

## 2016-01-12 DIAGNOSIS — Z6834 Body mass index (BMI) 34.0-34.9, adult: Secondary | ICD-10-CM | POA: Diagnosis not present

## 2016-01-12 DIAGNOSIS — L680 Hirsutism: Secondary | ICD-10-CM | POA: Diagnosis not present

## 2016-01-12 DIAGNOSIS — N6310 Unspecified lump in the right breast, unspecified quadrant: Secondary | ICD-10-CM | POA: Insufficient documentation

## 2016-01-12 DIAGNOSIS — R5383 Other fatigue: Secondary | ICD-10-CM

## 2016-01-12 DIAGNOSIS — N926 Irregular menstruation, unspecified: Secondary | ICD-10-CM

## 2016-01-12 DIAGNOSIS — N63 Unspecified lump in breast: Secondary | ICD-10-CM

## 2016-01-12 DIAGNOSIS — F17200 Nicotine dependence, unspecified, uncomplicated: Secondary | ICD-10-CM

## 2016-01-12 LAB — POCT URINALYSIS DIP (DEVICE)
BILIRUBIN URINE: NEGATIVE
Glucose, UA: NEGATIVE mg/dL
KETONES UR: NEGATIVE mg/dL
LEUKOCYTES UA: NEGATIVE
NITRITE: NEGATIVE
Protein, ur: NEGATIVE mg/dL
Specific Gravity, Urine: 1.02 (ref 1.005–1.030)
Urobilinogen, UA: 0.2 mg/dL (ref 0.0–1.0)
pH: 5.5 (ref 5.0–8.0)

## 2016-01-12 LAB — CBC WITH DIFFERENTIAL/PLATELET
BASOS ABS: 0 10*3/uL (ref 0.0–0.1)
Basophils Relative: 0 % (ref 0–1)
EOS PCT: 2 % (ref 0–5)
Eosinophils Absolute: 0.2 10*3/uL (ref 0.0–0.7)
HEMATOCRIT: 37.5 % (ref 36.0–46.0)
Hemoglobin: 12.3 g/dL (ref 12.0–15.0)
LYMPHS ABS: 3 10*3/uL (ref 0.7–4.0)
Lymphocytes Relative: 24 % (ref 12–46)
MCH: 29.9 pg (ref 26.0–34.0)
MCHC: 32.8 g/dL (ref 30.0–36.0)
MCV: 91.2 fL (ref 78.0–100.0)
MPV: 9.7 fL (ref 8.6–12.4)
Monocytes Absolute: 0.6 10*3/uL (ref 0.1–1.0)
Monocytes Relative: 5 % (ref 3–12)
Neutro Abs: 8.5 10*3/uL — ABNORMAL HIGH (ref 1.7–7.7)
Neutrophils Relative %: 69 % (ref 43–77)
Platelets: 266 10*3/uL (ref 150–400)
RBC: 4.11 MIL/uL (ref 3.87–5.11)
RDW: 14.1 % (ref 11.5–15.5)
WBC: 12.3 10*3/uL — ABNORMAL HIGH (ref 4.0–10.5)

## 2016-01-12 LAB — TESTOSTERONE: TESTOSTERONE: 45 ng/dL

## 2016-01-12 LAB — PROLACTIN: Prolactin: 40.4 ng/mL — ABNORMAL HIGH

## 2016-01-12 NOTE — Progress Notes (Signed)
Subjective:    Patient ID: Unknown Rebecca [name], female    DOB: 1976/08/09, 40 y.o.   MRN: [id number]  HPI [name], a 40 year old female with a history of a right breast mass presents to establish care. Patient reports that she has not had a primary provider over the past several years. Patient found a mass in her right breast while doing a monthly self-breast exam.  She was evaluated in the emergency department on 10/12/2016. She says that her maternal grandmother died of breast cancer in her 87s.  Change was noted several months ago. .  Patient's menstrual periods are irregular and are described as heavy. LMP was 01/04/2016. She denies breast discoloration, nipple discharge, abdominal pain, fatigue, weight loss, or pelvic pain. She denies recent hormone therapy. Patient is also a chronic everyday smoker. She has been smoking for greater than 20 years.  Past Medical History  Diagnosis Date  . Hypertension    Past Surgical History  Procedure Laterality Date  . Cesarean section      2003  No Known Allergies  Past Surgical History  Procedure Laterality Date  . Cesarean section      2003   Social History   Social History  . Marital Status: Single    Spouse Name: N/A  . Number of Children: N/A  . Years of Education: N/A   Occupational History  . Not on file.   Social History Main Topics  . Smoking status: Current Every Day Smoker -- 0.50 packs/day    Types: Cigarettes  . Smokeless tobacco: Never Used  . Alcohol Use: Yes     Comment: occ  . Drug Use: No  . Sexual Activity: Not on file   Other Topics Concern  . Not on file   Social History Narrative      Review of Systems  Constitutional: Negative for fever and fatigue.  HENT: Negative.   Eyes: Negative.   Respiratory: Negative.  Negative for shortness of breath.   Cardiovascular: Negative.   Gastrointestinal: Negative.   Endocrine: Negative for cold intolerance, heat intolerance, polydipsia, polyphagia and polyuria.    Genitourinary: Negative.  Negative for pelvic pain.  Musculoskeletal: Negative.   Allergic/Immunologic: Negative.  Negative for immunocompromised state.  Neurological: Negative.   Hematological: Negative.   Psychiatric/Behavioral: Negative.        Objective:   Physical Exam  Constitutional: Vital signs are normal. She appears well-developed and well-nourished. She appears cachectic.  HENT:  Head: Normocephalic and atraumatic.  Right Ear: Hearing, tympanic membrane, external ear and ear canal normal.  Left Ear: Hearing, tympanic membrane, external ear and ear canal normal.  Nose: Nose normal.  Mouth/Throat: Uvula is midline, oropharynx is clear and moist and mucous membranes are normal.  Pulmonary/Chest:    Skin: No rash noted.  Hirsutism to chin     BP 111/75 mmHg  Pulse 90  Temp(Src) 98.4 F (36.9 C) (Oral)  Resp 16  Ht 5\' 1"  (1.549 m)  Wt 184 lb (83.462 kg)  BMI 34.78 kg/m2  LMP 01/04/2016 Assessment & Plan:  1. Breast mass, right Round, raised, movable, non tender to palpation.  - Prolactin - MM Digital Diagnostic Bilat; Future - US BREAST LTD UNI RIGHT INC AXILLA; Future  2. Hirsutism I suspect patient may have increased testosterone levels due to increased hair growth to neck and chest.  - Testosterone  3. Irregular periods/menstrual cycles She may warrant a transvaginal/pelvic ultrasound.  - TSH - Prolactin - Testosterone  4.  BMI 34.0-34.9,adult Recommend a lowfat, low carbohydrate diet divided over 5-6 small meals, increase water intake to 6-8 glasses, and 150 minutes per week of cardiovascular exercise.   - Hemoglobin A1c - Lipid Panel - POCT urinalysis dip (device)  5. Other fatigue - COMPLETE METABOLIC PANEL WITH GFR - CBC with Differential  6. Tobacco dependence Smoking cessation instruction/counseling given: counseled patient on the dangers of tobacco use, advised patient to stop smoking, and reviewed strategies to maximize success    Anelle Parlow M, FNP  The patient was given clear instructions to go to ER or return to medical center if symptoms do not improve, worsen or new problems develop. The patient verbalized understanding. Will notify patient with laboratory results.

## 2016-01-12 NOTE — Patient Instructions (Addendum)
Will follow up with patient by phone with laboratory results.  Placed order for diagnostic screening test  Breast Self-Awareness Practicing breast self-awareness may pick up problems early, prevent significant medical complications, and possibly save your life. By practicing breast self-awareness, you can become familiar with how your breasts look and feel and if your breasts are changing. This allows you to notice changes early. It can also offer you some reassurance that your breast health is good. One way to learn what is normal for your breasts and whether your breasts are changing is to do a breast self-exam. If you find a lump or something that was not present in the past, it is best to contact your caregiver right away. Other findings that should be evaluated by your caregiver include nipple discharge, especially if it is bloody; skin changes or reddening; areas where the skin seems to be pulled in (retracted); or new lumps and bumps. Breast pain is seldom associated with cancer (malignancy), but should also be evaluated by a caregiver. HOW TO PERFORM A BREAST SELF-EXAM The best time to examine your breasts is 5-7 days after your menstrual period is over. During menstruation, the breasts are lumpier, and it may be more difficult to pick up changes. If you do not menstruate, have reached menopause, or had your uterus removed (hysterectomy), you should examine your breasts at regular intervals, such as monthly. If you are breastfeeding, examine your breasts after a feeding or after using a breast pump. Breast implants do not decrease the risk for lumps or tumors, so continue to perform breast self-exams as recommended. Talk to your caregiver about how to determine the difference between the implant and breast tissue. Also, talk about the amount of pressure you should use during the exam. Over time, you will become more familiar with the variations of your breasts and more comfortable with the exam. A  breast self-exam requires you to remove all your clothes above the waist. 1. Look at your breasts and nipples. Stand in front of a mirror in a room with good lighting. With your hands on your hips, push your hands firmly downward. Look for a difference in shape, contour, and size from one breast to the other (asymmetry). Asymmetry includes puckers, dips, or bumps. Also, look for skin changes, such as reddened or scaly areas on the breasts. Look for nipple changes, such as discharge, dimpling, repositioning, or redness. 2. Carefully feel your breasts. This is best done either in the shower or tub while using soapy water or when flat on your back. Place the arm (on the side of the breast you are examining) above your head. Use the pads (not the fingertips) of your three middle fingers on your opposite hand to feel your breasts. Start in the underarm area and use  inch (2 cm) overlapping circles to feel your breast. Use 3 different levels of pressure (light, medium, and firm pressure) at each circle before moving to the next circle. The light pressure is needed to feel the tissue closest to the skin. The medium pressure will help to feel breast tissue a little deeper, while the firm pressure is needed to feel the tissue close to the ribs. Continue the overlapping circles, moving downward over the breast until you feel your ribs below your breast. Then, move one finger-width towards the center of the body. Continue to use the  inch (2 cm) overlapping circles to feel your breast as you move slowly up toward the collar bone (clavicle) near the  base of the neck. Continue the up and down exam using all 3 pressures until you reach the middle of the chest. Do this with each breast, carefully feeling for lumps or changes. 3.  Keep a written record with breast changes or normal findings for each breast. By writing this information down, you do not need to depend only on memory for size, tenderness, or location. Write down  where you are in your menstrual cycle, if you are still menstruating. Breast tissue can have some lumps or thick tissue. However, see your caregiver if you find anything that concerns you.  SEEK MEDICAL CARE IF:  You see a change in shape, contour, or size of your breasts or nipples.   You see skin changes, such as reddened or scaly areas on the breasts or nipples.   You have an unusual discharge from your nipples.   You feel a new lump or unusually thick areas.    This information is not intended to replace advice given to you by your health care provider. Make sure you discuss any questions you have with your health care provider.   Document Released: 11/21/2005 Document Revised: 11/07/2012 Document Reviewed: 03/07/2012 Elsevier Interactive Patient Education [employer].

## 2016-01-13 DIAGNOSIS — F17200 Nicotine dependence, unspecified, uncomplicated: Secondary | ICD-10-CM | POA: Insufficient documentation

## 2016-01-13 DIAGNOSIS — N926 Irregular menstruation, unspecified: Secondary | ICD-10-CM | POA: Insufficient documentation

## 2016-01-13 DIAGNOSIS — L680 Hirsutism: Secondary | ICD-10-CM | POA: Insufficient documentation

## 2016-01-13 LAB — LIPID PANEL
CHOL/HDL RATIO: 3.1 ratio (ref <=5.0)
CHOLESTEROL: 135 mg/dL (ref 125–200)
HDL: 43 mg/dL — ABNORMAL LOW (ref >=46)
LDL Cholesterol: 76 mg/dL (ref <130)
TRIGLYCERIDES: 79 mg/dL (ref <150)
VLDL: 16 mg/dL (ref <30)

## 2016-01-13 LAB — COMPLETE METABOLIC PANEL WITH GFR
ALT: 8 U/L (ref 6–29)
AST: 12 U/L (ref 10–30)
Albumin: 4.2 g/dL (ref 3.6–5.1)
Alkaline Phosphatase: 47 U/L (ref 33–115)
BILIRUBIN TOTAL: 0.4 mg/dL (ref 0.2–1.2)
BUN: 7 mg/dL (ref 7–25)
CO2: 22 mmol/L (ref 20–31)
CREATININE: 0.75 mg/dL (ref 0.50–1.10)
Calcium: 8.8 mg/dL (ref 8.6–10.2)
Chloride: 107 mmol/L (ref 98–110)
GFR, Est Non African American: >89 mL/min (ref >=60)
GLUCOSE: 69 mg/dL (ref 65–99)
Potassium: 4 mmol/L (ref 3.5–5.3)
SODIUM: 140 mmol/L (ref 135–146)
TOTAL PROTEIN: 7.1 g/dL (ref 6.1–8.1)

## 2016-01-13 LAB — HEMOGLOBIN A1C
Hgb A1c MFr Bld: 5.7 % — ABNORMAL HIGH (ref <5.7)
MEAN PLASMA GLUCOSE: 117 mg/dL — AB (ref <117)

## 2016-01-13 LAB — TSH: TSH: 0.8 mIU/L

## 2016-01-15 ENCOUNTER — Other Ambulatory Visit: Payer: Self-pay | Admitting: Family Medicine

## 2016-01-15 DIAGNOSIS — N6310 Unspecified lump in the right breast, unspecified quadrant: Secondary | ICD-10-CM

## 2016-01-19 ENCOUNTER — Other Ambulatory Visit: Payer: Self-pay | Admitting: Family Medicine

## 2016-01-19 ENCOUNTER — Ambulatory Visit
Admission: RE | Admit: 2016-01-19 | Discharge: 2016-01-19 | Disposition: A | Discharge status: Home / Self Care | Payer: Medicaid Other | Source: Ambulatory Visit | Attending: Family Medicine | Admitting: Family Medicine

## 2016-01-19 ENCOUNTER — Ambulatory Visit
Admission: RE | Admit: 2016-01-19 | Discharge: 2016-01-19 | Disposition: A | Discharge status: Home / Self Care | Payer: Medicaid Other | Source: Ambulatory Visit

## 2016-01-19 DIAGNOSIS — R928 Other abnormal and inconclusive findings on diagnostic imaging of breast: Secondary | ICD-10-CM

## 2016-01-19 DIAGNOSIS — N6310 Unspecified lump in the right breast, unspecified quadrant: Secondary | ICD-10-CM

## 2016-01-19 DIAGNOSIS — R2231 Localized swelling, mass and lump, right upper limb: Secondary | ICD-10-CM

## 2016-01-27 ENCOUNTER — Other Ambulatory Visit: Payer: Self-pay | Admitting: Family Medicine

## 2016-01-27 DIAGNOSIS — N6310 Unspecified lump in the right breast, unspecified quadrant: Secondary | ICD-10-CM

## 2016-01-28 ENCOUNTER — Ambulatory Visit
Admission: RE | Admit: 2016-01-28 | Discharge: 2016-01-28 | Disposition: A | Discharge status: Home / Self Care | Payer: Medicaid Other | Source: Ambulatory Visit | Attending: Family Medicine | Admitting: Family Medicine

## 2016-01-28 DIAGNOSIS — N6310 Unspecified lump in the right breast, unspecified quadrant: Secondary | ICD-10-CM

## 2016-01-28 DIAGNOSIS — R2231 Localized swelling, mass and lump, right upper limb: Secondary | ICD-10-CM

## 2016-02-01 ENCOUNTER — Telehealth: Payer: Self-pay | Admitting: *Deleted

## 2016-02-01 NOTE — Telephone Encounter (Signed)
Left vm for pt to return call regarding [place] for 02/10/16. Contact information provided.

## 2016-02-02 ENCOUNTER — Telehealth: Payer: Self-pay | Admitting: *Deleted

## 2016-02-02 NOTE — Telephone Encounter (Signed)
Left vm for pt to return call regarding [name]. Contact information provided.

## 2016-02-04 ENCOUNTER — Telehealth: Payer: Self-pay | Admitting: *Deleted

## 2016-02-04 DIAGNOSIS — C50411 Malignant neoplasm of upper-outer quadrant of right female breast: Secondary | ICD-10-CM

## 2016-02-04 DIAGNOSIS — Z171 Estrogen receptor negative status [ER-]: Secondary | ICD-10-CM

## 2016-02-04 NOTE — Telephone Encounter (Signed)
Left message with mother [name] to call for [facility] appt.

## 2016-02-04 NOTE — Telephone Encounter (Signed)
Received call back from patient. Confirmed [facility] for 02/10/16 at 815am .  Instructions and contact information given.

## 2016-02-04 NOTE — Telephone Encounter (Signed)
Attempted to call patient.  Her voicemail is full and I was unable to leave a message.

## 2016-02-08 ENCOUNTER — Telehealth: Payer: Self-pay | Admitting: *Deleted

## 2016-02-08 NOTE — Telephone Encounter (Signed)
Mailed clinic packet to pt.  

## 2016-02-10 ENCOUNTER — Other Ambulatory Visit (HOSPITAL_BASED_OUTPATIENT_CLINIC_OR_DEPARTMENT_OTHER): Payer: Medicaid Other

## 2016-02-10 ENCOUNTER — Encounter: Payer: Self-pay | Admitting: *Deleted

## 2016-02-10 ENCOUNTER — Encounter: Payer: Self-pay | Admitting: Nurse Practitioner

## 2016-02-10 ENCOUNTER — Encounter: Payer: Self-pay | Admitting: Oncology

## 2016-02-10 ENCOUNTER — Ambulatory Visit: Payer: Medicaid Other | Attending: Surgery | Admitting: Physical Therapy

## 2016-02-10 ENCOUNTER — Ambulatory Visit (HOSPITAL_BASED_OUTPATIENT_CLINIC_OR_DEPARTMENT_OTHER): Payer: Medicaid Other | Admitting: Oncology

## 2016-02-10 ENCOUNTER — Encounter: Payer: Self-pay | Admitting: Skilled Nursing Facility1

## 2016-02-10 ENCOUNTER — Other Ambulatory Visit: Payer: Self-pay | Admitting: *Deleted

## 2016-02-10 ENCOUNTER — Ambulatory Visit
Admission: RE | Admit: 2016-02-10 | Discharge: 2016-02-10 | Disposition: A | Discharge status: Home / Self Care | Payer: Medicaid Other | Source: Ambulatory Visit | Attending: Radiation Oncology | Admitting: Radiation Oncology

## 2016-02-10 ENCOUNTER — Encounter: Payer: Self-pay | Admitting: Physical Therapy

## 2016-02-10 ENCOUNTER — Other Ambulatory Visit: Payer: Self-pay | Admitting: Surgery

## 2016-02-10 VITALS — BP 131/91 | HR 77 | Temp 98.0°F | Resp 18 | Ht 61.0 in | Wt 187.3 lb

## 2016-02-10 DIAGNOSIS — Z720 Tobacco use: Secondary | ICD-10-CM

## 2016-02-10 DIAGNOSIS — C50411 Malignant neoplasm of upper-outer quadrant of right female breast: Secondary | ICD-10-CM

## 2016-02-10 DIAGNOSIS — M545 Low back pain: Secondary | ICD-10-CM | POA: Diagnosis not present

## 2016-02-10 DIAGNOSIS — Z171 Estrogen receptor negative status [ER-]: Secondary | ICD-10-CM

## 2016-02-10 DIAGNOSIS — Z808 Family history of malignant neoplasm of other organs or systems: Secondary | ICD-10-CM

## 2016-02-10 DIAGNOSIS — R293 Abnormal posture: Secondary | ICD-10-CM

## 2016-02-10 DIAGNOSIS — G4700 Insomnia, unspecified: Secondary | ICD-10-CM | POA: Diagnosis not present

## 2016-02-10 DIAGNOSIS — N644 Mastodynia: Secondary | ICD-10-CM | POA: Diagnosis not present

## 2016-02-10 DIAGNOSIS — Z809 Family history of malignant neoplasm, unspecified: Secondary | ICD-10-CM | POA: Diagnosis not present

## 2016-02-10 DIAGNOSIS — Z803 Family history of malignant neoplasm of breast: Secondary | ICD-10-CM | POA: Diagnosis not present

## 2016-02-10 LAB — CBC WITH DIFFERENTIAL/PLATELET
BASO%: 0.2 % (ref 0.0–2.0)
Basophils Absolute: 0 10*3/uL (ref 0.0–0.1)
EOS%: 1.8 % (ref 0.0–7.0)
Eosinophils Absolute: 0.2 10*3/uL (ref 0.0–0.5)
HCT: 35.6 % (ref 34.8–46.6)
HGB: 12.2 g/dL (ref 11.6–15.9)
LYMPH%: 23.7 % (ref 14.0–49.7)
MCH: 30.7 pg (ref 25.1–34.0)
MCHC: 34.3 g/dL (ref 31.5–36.0)
MCV: 89.4 fL (ref 79.5–101.0)
MONO#: 0.7 10*3/uL (ref 0.1–0.9)
MONO%: 6.5 % (ref 0.0–14.0)
NEUT%: 67.8 % (ref 38.4–76.8)
NEUTROS ABS: 7.5 10*3/uL — AB (ref 1.5–6.5)
PLATELETS: 224 10*3/uL (ref 145–400)
RBC: 3.98 10*6/uL (ref 3.70–5.45)
RDW: 13.8 % (ref 11.2–14.5)
WBC: 11.1 10*3/uL — AB (ref 3.9–10.3)
lymph#: 2.6 10*3/uL (ref 0.9–3.3)

## 2016-02-10 LAB — COMPREHENSIVE METABOLIC PANEL
ALBUMIN: 3.8 g/dL (ref 3.5–5.0)
ALK PHOS: 51 U/L (ref 40–150)
ALT: <9 U/L (ref 0–55)
AST: 11 U/L (ref 5–34)
Anion Gap: 8 mEq/L (ref 3–11)
BUN: 6.6 mg/dL — AB (ref 7.0–26.0)
CALCIUM: 9.1 mg/dL (ref 8.4–10.4)
CO2: 24 mEq/L (ref 22–29)
Chloride: 108 mEq/L (ref 98–109)
Creatinine: 0.8 mg/dL (ref 0.6–1.1)
GLUCOSE: 91 mg/dL (ref 70–140)
Potassium: 3.9 mEq/L (ref 3.5–5.1)
SODIUM: 140 meq/L (ref 136–145)
TOTAL PROTEIN: 7.4 g/dL (ref 6.4–8.3)

## 2016-02-10 MED ORDER — LORAZEPAM 0.5 MG PO TABS: 0.5000 mg | ORAL_TABLET | Freq: Four times a day (QID) | ORAL | Status: DC | PRN

## 2016-02-10 NOTE — Progress Notes (Signed)
[facility] Psychosocial Distress Screening Clinical Social Work  Clinical Social Work met with pt and her mother at [facility] to introduce self, explain role of CSW/Pt and Family Support Team and review distress screening protocol.  The patient scored a 9 on the Psychosocial Distress Thermometer which indicates severe distress. Clinical Social Worker met with pt at length to assess for distress and other psychosocial needs. Pt shared she had not been able to sleep since she was diagnosed with breast cancer. She has numerous stressors in the home, as she is a single mom with four children. She reports to have three at home ages 48, 50 and 42yo. 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 She was coping well prior to her diagnosis. She shared she has never been to counseling. We discussed today that it is very important to take care of the whole person while going through cancer care. Pt will consider additional support options and reach back out to CSW. CSW also discussed various resources to help with finances through [employer], etc. Transportation will also be a concern as pt often uses the bus. We discussed how to get approved for [employer]AT today.   ONCBCN DISTRESS SCREENING 02/10/2016  Screening Type Initial Screening  Distress experienced in past week (1-10) 9  Practical problem type Housing;Work/school;Transportation;Food  Family Problem type Children  Emotional problem type Depression;Nervousness/Anxiety;Adjusting to illness;Isolation/feeling alone;Feeling hopeless  Physical Problem type Pain;Sleep/insomnia;Loss of appetitie;Skin dry/itchy  Physician notified of physical symptoms Yes  Referral to clinical social work Yes  Referral to financial advocate Yes  Referral to support programs Yes    Clinical Social Worker follow up needed: Yes.    If yes, follow up plan:  CSW to follow up at future appt.  Loren Racer, [place] Worker [place]  [facility] Phone: [phone] Fax: [phone]

## 2016-02-10 NOTE — Progress Notes (Signed)
Radiation Oncology         [phone]) [phone] ________________________________  Initial Outpatient Consultation  Name: Rebecca [name] MRN: [id number]  Date: 02/10/2016  DOB: February 09, 1976  CC:Dorena Dew, FNP  Alphonsa Overall, MD   REFERRING PHYSICIAN: Alphonsa Overall, MD  DIAGNOSIS: The encounter diagnosis was Breast cancer of upper-outer quadrant of right female breast ([place]). Clinical stage II (T2a) invasive ductal carcinoma of the right breast  HISTORY OF PRESENT ILLNESS::Rebecca [name] is a 40 y.o. female who presented to the ED on 10/13/2015 for a self palpated right breast mass associated with pain and mild pruritus. The patient did not have a PCP at that time and was referred to Hillis Range, NP on 01/12/16. Bilateral mammogram on 01/19/2016 revealed a 2.8 cm spiculated mass within the central right breast. Targeted ultrasound revealed an irregular hypoechoic mass within the right breast measuring 2.6 x 2.3 x 2.5 cm in the 11:30 o'clock position 5 cm from the nipple corresponding to the palpable abnormality. Ultrasound of the right axilla showed an abnormal lymph node with asymmetric cortical thickening to 5 mm.  Biopsy of the right breast on 01/28/2016 revealed grade 3 invasive high grade ductal carcinoma (ER/PR negative, HER2 negative, Ki67 70%). Biopsy of a right axillary lymph node was negative for malignancy.  The patient presents to multidisciplinary breast clinic and my consideration of radiotherapy for the management of her disease.  PREVIOUS RADIATION THERAPY: No  PAST MEDICAL HISTORY:  has a past medical history of Hypertension; Breast cancer ([place]); Depression; and Anxiety.    Gynecologic History  Age at first menstrual period? 7  Are you still having periods? Yes Approximate date of last period? 02/08/16  If you are still having periods: Are your periods regular? Yes Obstetric History:  How many children have you carried to term? 4 Your age at first live birth? 75  Pregnant now or  trying to get pregnant? No  Have you used birth control pills or hormone shots for contraception? Yes  If so, for how long (or approximate dates)? 1990s  Would you be interested in learning more about the options to preserve fertility? No Health Maintenance:  Have you ever had a colonoscopy? No  Have you ever had a bone density? No  PAST SURGICAL HISTORY: Past Surgical History  Procedure Laterality Date  . Cesarean section      2003    FAMILY HISTORY: family history includes Breast cancer in her cousin, maternal aunt, and maternal grandmother; Cancer in her father; Hypertension in her father and mother; Prostate cancer in her maternal uncle; Throat cancer in her maternal grandfather; Uterine cancer in her mother.   SOCIAL HISTORY:  reports that she has been smoking Cigarettes.  She has been smoking about 0.50 packs per day. She has never used smokeless tobacco. She reports that she drinks alcohol. She reports that she uses illicit drugs (Marijuana). Works as a Immunologist  ALLERGIES: Review of patient's allergies indicates no known allergies.  MEDICATIONS:  Current Outpatient Prescriptions  Medication Sig Dispense Refill  . LORazepam (ATIVAN) 0.5 MG tablet Take 1 tablet (0.5 mg total) by mouth every 6 (six) hours as needed (nausea and vomiting). 30 tablet 0   No current facility-administered medications for this encounter.    REVIEW OF SYSTEMS:  A 15 point review of systems is documented in the electronic medical record. This was obtained by the nursing staff. However, I reviewed this with the patient to discuss relevant findings and make appropriate changes.  Pertinent items noted in  HPI and remainder of comprehensive ROS otherwise negative.  The patient complains of weight loss, loss of sleep, fatigue which affects her activities, right breast pain (throbbing), muscle aches, sinus problems, chest pain, sleeps on more than one pillow (two), coughing, a poor appetite, heartburn, a breast mass  and breast pain, urinary leakage, skin rash (ezema), back pain, difficulty walking, headaches, forgetfulness, anxiety, depression, and sickle cell trait.  PHYSICAL EXAM:  Vitals with BMI 02/10/2016  Height 5' 1"   Weight 187 lbs 5 oz  BMI 22.0  Systolic 254  Diastolic 91  Pulse 77  Respirations 18   General: Alert and oriented, in no acute distress, Accompanied by her mother on evaluation today HEENT: Head is normocephalic. Extraocular movements are intact. Oropharynx is clear. Neck: Neck is supple, no palpable cervical or supraclavicular lymphadenopathy. Heart: Regular in rate and rhythm with no murmurs, rubs, or gallops. Chest: Clear to auscultation bilaterally, with no rhonchi, wheezes, or rales. Abdomen: Soft, nontender, nondistended, with no rigidity or guarding. Extremities: No cyanosis or edema. Lymphatics: see Neck Exam Skin: No concerning lesions. Breast: In the 11:30 o'clock position of the right breast, there is an approximately 3 x 3 cm easily palpable mass, 6 cm from the nipple/areolar complex. This is very tender to palpation. Musculoskeletal: symmetric strength and muscle tone throughout. Neurologic: Cranial nerves II through XII are grossly intact. No obvious focalities. Speech is fluent. Coordination is intact. Psychiatric: Judgment and insight are intact. Affect is appropriate.  ECOG = 1  LABORATORY DATA:  Lab Results  Component Value Date   WBC 11.1* 02/10/2016   HGB 12.2 02/10/2016   HCT 35.6 02/10/2016   MCV 89.4 02/10/2016   PLT 224 02/10/2016   NEUTROABS 7.5* 02/10/2016   Lab Results  Component Value Date   NA 140 02/10/2016   K 3.9 02/10/2016   CL 107 01/12/2016   CO2 24 02/10/2016   GLUCOSE 91 02/10/2016   CREATININE 0.8 02/10/2016   CALCIUM 9.1 02/10/2016      RADIOGRAPHY: Mm Digital Diagnostic Unilat R  01/28/2016  CLINICAL DATA:  Post biopsy mammogram of the right breast for clip placement. EXAM: DIAGNOSTIC RIGHT MAMMOGRAM POST ULTRASOUND  BIOPSY COMPARISON:  Previous exam(s). FINDINGS: Mammographic images were obtained following ultrasound guided biopsy of mass in the right breast at 1130, and a lymph node in the right axilla. A ribbon shaped biopsy marking clip is appropriately positioned within the mass in the right breast at 1130. A spring shaped biopsy marking clip is visualized within a lymph node seen only on the MLO view in the right axilla. IMPRESSION: 1. Appropriate positioning of the ribbon shaped biopsy marking clip within the right breast mass at 1130. 2. A spring shaped biopsy marking clip is visualized on a single view in the right axilla. Final Assessment: Post Procedure Mammograms for Marker Placement Electronically Signed   By: [name] Ferrier M.D.   On: 01/28/2016 14:47   US Breast Ltd Uni Left Inc Axilla  01/19/2016  CLINICAL DATA:  Patient presents today with a palpable lump in the right breast. Extensive family history of breast cancer. This is patient's baseline mammogram. EXAM: DIGITAL DIAGNOSTIC BILATERAL MAMMOGRAM WITH 3D TOMOSYNTHESIS WITH CAD ULTRASOUND BILATERAL BREAST COMPARISON:  No prior exams. ACR Breast Density Category b: There are scattered areas of fibroglandular density. FINDINGS: There is a spiculated mass within the central right breast, slightly upper outer quadrant, at middle to posterior depth, measuring approximately 2.8 cm greatest dimension, corresponding to the palpable abnormality with overlying  skin marker in place. No additional masses or suspicious calcifications within the right breast. There are no dominant masses, suspicious calcifications or secondary signs of malignancy within the left breast. Tubular densities within the retroareolar left breast are suggestive of duct ectasia. Ultrasound will be performed to confirm. Mammographic images were processed with CAD. On physical exam, a firm palpable mass is identified within the slightly upper outer quadrant of the right breast. Targeted  ultrasound is performed, showing an irregular hypoechoic mass within the right breast at the 11:30 o'clock axis, 5 cm from the nipple, measuring 2.6 x 2.3 x 2.5 cm, corresponding to the palpable abnormality. Ultrasound of the right axilla showed a morphologically abnormal lymph node with asymmetric cortical thickening to 5 mm. Ultrasound was performed of the retroareolar left breast showing multiple mildly prominent retroareolar ducts with internal debris compatible with benign duct ectasia. Similar-appearing ducts are present within the retroareolar right breast. IMPRESSION: 1. Spiculated mass within the RIGHT breast at the 11:30 o'clock axis, 5 cm from the nipple, measuring 2.6 x 2.3 x 2.5 cm, corresponding to patient's palpable abnormality. This is a highly suspicious finding for which ultrasound-guided biopsy is recommended. 2. Morphologically abnormal lymph node within the right axilla, mildly prominent with asymmetric cortex thickening to 5 mm, for which ultrasound-guided biopsy is also recommended. RECOMMENDATION: Ultrasound-guided biopsy of the right breast mass at the 11:30 o'clock axis. Ultrasound-guided biopsy of the morphologically abnormal lymph node in the right axilla. I have discussed the findings and recommendations with the patient. Results were also provided in writing at the conclusion of the visit. If applicable, a reminder letter will be sent to the patient regarding the next appointment. BI-RADS CATEGORY  5: Highly suggestive of malignancy. Electronically Signed   By: Franki Cabot M.D.   On: 01/19/2016 13:58   US Breast Ltd Uni Right Inc Axilla  01/19/2016  CLINICAL DATA:  Patient presents today with a palpable lump in the right breast. Extensive family history of breast cancer. This is patient's baseline mammogram. EXAM: DIGITAL DIAGNOSTIC BILATERAL MAMMOGRAM WITH 3D TOMOSYNTHESIS WITH CAD ULTRASOUND BILATERAL BREAST COMPARISON:  No prior exams. ACR Breast Density Category b: There are  scattered areas of fibroglandular density. FINDINGS: There is a spiculated mass within the central right breast, slightly upper outer quadrant, at middle to posterior depth, measuring approximately 2.8 cm greatest dimension, corresponding to the palpable abnormality with overlying skin marker in place. No additional masses or suspicious calcifications within the right breast. There are no dominant masses, suspicious calcifications or secondary signs of malignancy within the left breast. Tubular densities within the retroareolar left breast are suggestive of duct ectasia. Ultrasound will be performed to confirm. Mammographic images were processed with CAD. On physical exam, a firm palpable mass is identified within the slightly upper outer quadrant of the right breast. Targeted ultrasound is performed, showing an irregular hypoechoic mass within the right breast at the 11:30 o'clock axis, 5 cm from the nipple, measuring 2.6 x 2.3 x 2.5 cm, corresponding to the palpable abnormality. Ultrasound of the right axilla showed a morphologically abnormal lymph node with asymmetric cortical thickening to 5 mm. Ultrasound was performed of the retroareolar left breast showing multiple mildly prominent retroareolar ducts with internal debris compatible with benign duct ectasia. Similar-appearing ducts are present within the retroareolar right breast. IMPRESSION: 1. Spiculated mass within the RIGHT breast at the 11:30 o'clock axis, 5 cm from the nipple, measuring 2.6 x 2.3 x 2.5 cm, corresponding to patient's palpable abnormality. This is a  highly suspicious finding for which ultrasound-guided biopsy is recommended. 2. Morphologically abnormal lymph node within the right axilla, mildly prominent with asymmetric cortex thickening to 5 mm, for which ultrasound-guided biopsy is also recommended. RECOMMENDATION: Ultrasound-guided biopsy of the right breast mass at the 11:30 o'clock axis. Ultrasound-guided biopsy of the morphologically  abnormal lymph node in the right axilla. I have discussed the findings and recommendations with the patient. Results were also provided in writing at the conclusion of the visit. If applicable, a reminder letter will be sent to the patient regarding the next appointment. BI-RADS CATEGORY  5: Highly suggestive of malignancy. Electronically Signed   By: Franki Cabot M.D.   On: 01/19/2016 13:58   Mm Diag Breast Tomo Bilateral  01/19/2016  CLINICAL DATA:  Patient presents today with a palpable lump in the right breast. Extensive family history of breast cancer. This is patient's baseline mammogram. EXAM: DIGITAL DIAGNOSTIC BILATERAL MAMMOGRAM WITH 3D TOMOSYNTHESIS WITH CAD ULTRASOUND BILATERAL BREAST COMPARISON:  No prior exams. ACR Breast Density Category b: There are scattered areas of fibroglandular density. FINDINGS: There is a spiculated mass within the central right breast, slightly upper outer quadrant, at middle to posterior depth, measuring approximately 2.8 cm greatest dimension, corresponding to the palpable abnormality with overlying skin marker in place. No additional masses or suspicious calcifications within the right breast. There are no dominant masses, suspicious calcifications or secondary signs of malignancy within the left breast. Tubular densities within the retroareolar left breast are suggestive of duct ectasia. Ultrasound will be performed to confirm. Mammographic images were processed with CAD. On physical exam, a firm palpable mass is identified within the slightly upper outer quadrant of the right breast. Targeted ultrasound is performed, showing an irregular hypoechoic mass within the right breast at the 11:30 o'clock axis, 5 cm from the nipple, measuring 2.6 x 2.3 x 2.5 cm, corresponding to the palpable abnormality. Ultrasound of the right axilla showed a morphologically abnormal lymph node with asymmetric cortical thickening to 5 mm. Ultrasound was performed of the retroareolar left  breast showing multiple mildly prominent retroareolar ducts with internal debris compatible with benign duct ectasia. Similar-appearing ducts are present within the retroareolar right breast. IMPRESSION: 1. Spiculated mass within the RIGHT breast at the 11:30 o'clock axis, 5 cm from the nipple, measuring 2.6 x 2.3 x 2.5 cm, corresponding to patient's palpable abnormality. This is a highly suspicious finding for which ultrasound-guided biopsy is recommended. 2. Morphologically abnormal lymph node within the right axilla, mildly prominent with asymmetric cortex thickening to 5 mm, for which ultrasound-guided biopsy is also recommended. RECOMMENDATION: Ultrasound-guided biopsy of the right breast mass at the 11:30 o'clock axis. Ultrasound-guided biopsy of the morphologically abnormal lymph node in the right axilla. I have discussed the findings and recommendations with the patient. Results were also provided in writing at the conclusion of the visit. If applicable, a reminder letter will be sent to the patient regarding the next appointment. BI-RADS CATEGORY  5: Highly suggestive of malignancy. Electronically Signed   By: Franki Cabot M.D.   On: 01/19/2016 13:58   [place] Rt Breast Bx W Loc Dev 1st Lesion Img Bx Spec US Guide  01/29/2016  ADDENDUM REPORT: 01/29/2016 12:55 ADDENDUM: Pathology revealed grade III invasive ductal carcinoma in the right breast and a benign right axillary lymph node. This was found to be concordant by Dr. Dorise Bullion. Pathology results were discussed with the patient by telephone. The patient reported doing well after the biopsy with tenderness at the sites.  Post biopsy instructions and care were reviewed and questions were answered. The patient was encouraged to call [facility] for any additional concerns. The patient was referred to the [facility] at the [facility] on February 10, 2016. Pathology results reported  by Susa Raring RN, BSN on 01/29/2016. Electronically Signed   By: Dorise Bullion III M.D   On: 01/29/2016 12:55  01/29/2016  CLINICAL DATA:  Right breast mass EXAM: ULTRASOUND GUIDED RIGHT BREAST CORE NEEDLE BIOPSY COMPARISON:  Previous exam(s). FINDINGS: I met with the patient and we discussed the procedure of ultrasound-guided biopsy, including benefits and alternatives. We discussed the high likelihood of a successful procedure. We discussed the risks of the procedure, including infection, bleeding, tissue injury, clip migration, and inadequate sampling. Informed written consent was given. The usual time-out protocol was performed immediately prior to the procedure. Using sterile technique and 1% Lidocaine as local anesthetic, under direct ultrasound visualization, a 14 gauge spring-loaded device was used to perform biopsy of a right breast mass using a lateral approach. At the conclusion of the procedure a ribbon shaped tissue marker clip was deployed into the biopsy cavity. Follow up 2 view mammogram was performed and dictated separately. IMPRESSION: Ultrasound guided biopsy of the right breast mass. No apparent complications. Electronically Signed: By: Dorise Bullion III M.D On: 01/28/2016 14:57   [place] Rt Breast Bx W Loc Dev Ea Add Lesion Img Bx Spec US Guide  01/29/2016  ADDENDUM REPORT: 01/29/2016 12:55 ADDENDUM: Pathology revealed grade III invasive ductal carcinoma in the right breast and a benign right axillary lymph node. This was found to be concordant by Dr. Dorise Bullion. Pathology results were discussed with the patient by telephone. The patient reported doing well after the biopsy with tenderness at the sites. Post biopsy instructions and care were reviewed and questions were answered. The patient was encouraged to call [facility] for any additional concerns. The patient was referred to the [facility] at the [facility] on February 10, 2016. Pathology results reported by Susa Raring RN, BSN on 01/29/2016. Electronically Signed   By: Dorise Bullion III M.D   On: 01/29/2016 12:55  01/29/2016  CLINICAL DATA:  Right axillary lymph node with thickened cortex. EXAM: ULTRASOUND GUIDED CORE NEEDLE BIOPSY OF A right AXILLARY NODE COMPARISON:  Previous exam(s). FINDINGS: I met with the patient and we discussed the procedure of ultrasound-guided biopsy, including benefits and alternatives. We discussed the high likelihood of a successful procedure. We discussed the risks of the procedure, including infection, bleeding, tissue injury, clip migration, and inadequate sampling. Informed written consent was given. The usual time-out protocol was performed immediately prior to the procedure. Using sterile technique and 1% Lidocaine as local anesthetic, under direct ultrasound visualization, a 14 gauge spring-loaded device was used to perform biopsy of a right axillary lymph node using a lateral approach. At the conclusion of the procedure a HydroMARK tissue clip was deployed into the biopsy cavity. Follow up 2 view mammogram was performed and dictated separately. IMPRESSION: Ultrasound guided biopsy of a right axillary. No apparent complications. Electronically Signed: By: Dorise Bullion III M.D On: 01/28/2016 14:58      IMPRESSION: Clinical stage II (T2a) invasive ductal carcinoma of the right breast. The patient would be a candidate for radiation to the component of breast conservation therapy.  If the patient proceeds with mastectomy she may not require radiation  therapy if she has clear margins and no lymph node spread.  I spoke to the patient today regarding her diagnosis and options for treatment. We discussed the equivalence in terms of survival and local failure between mastectomy and breast conservation. We discussed the role of radiation in decreasing local failures in patients who undergo lumpectomy. We discussed the process  of CT simulation and the placement tattoos. We discussed 4-6 weeks of treatment as an outpatient. We discussed the possibility of asymptomatic lung damage. We discussed the low likelihood of secondary malignancies. We discussed the possible side effects including but not limited to skin redness, fatigue, permanent skin darkening, and breast swelling.   PLAN: The patient will be referred for genetics (on 3/16) and have been scheduled for a bilateral breast MRI (on 3/13). She will then have an echocardiogram and be enrolled for a chemotherapy class. A port-a-cath would be placed and she will receive neoadjuvant chemotherapy. Afterwards, she could undergo a right lumpectomy and sentinel lymph node biopsy. However, she is strongly leaning towards at least a right mastectomy and possibly a left breast mastectomy if genetics comes back as positive.      ------------------------------------------------  Blair Promise, PhD, MD  This document serves as a record of services personally performed by Gery Pray, MD. It was created on his behalf by Darcus Austin, a trained medical scribe. The creation of this record is based on the scribe's personal observations and the provider's statements to them. This document has been checked and approved by the attending provider.

## 2016-02-10 NOTE — Progress Notes (Signed)
[name] is a very pleasant 40 y.o. female from [place], [place] with newly diagnosed invasive ductal carcinoma of the right breast.  Biopsy results revealed the tumor's prognostic profile is ER negative, PR negative, and HER2/neu negative.  She presents today with her mother to the [facility]) for treatment consideration and recommendations from the breast surgeon, radiation oncologist, and medical oncologist.     I briefly met with [name] and her mother during her [facility] visit today. We discussed the purpose of the Survivorship Clinic, which will include monitoring for recurrence, coordinating completion of age and gender-appropriate cancer screenings, promotion of overall wellness, as well as managing potential late/long-term side effects of anti-cancer treatments.    The treatment plan for [name] will likely include surgery, chemotherapy, and radiation therapy.  She will meet with the Genetics Counselor due to her age and triple negative status. As of today, the intent of treatment for [name] is cure, therefore she will be eligible for the Survivorship Clinic upon her completion of treatment.  Her survivorship care plan (SCP) document will be drafted and updated throughout the course of her treatment trajectory. She will receive the SCP in an office visit with myself in the Survivorship Clinic once she has completed treatment.   [name] was encouraged to ask questions and all questions were answered to her satisfaction.  She was given my business card and encouraged to contact me with any concerns regarding survivorship.  I look forward to participating in her care.   Kenn File, [place] [phone]

## 2016-02-10 NOTE — Progress Notes (Signed)
Subjective:     Patient ID: [name], female   DOB: November 18, 1976, 40 y.o.   MRN: [id number]  HPI   Review of Systems     Objective:   Physical Exam For the patient to understand and be given the tools to implement a healthy plant based diet during their cancer diagnosis.     Assessment:     Patient was seen today and found to be in good spirits and accompanied by her mother. Pt is 5'1'', 187 pounds, BMI 35.5. Pts labs: WBC 11.1, BUN 6.6. Pt states she was 300 pounds and lost down to 170 pounds and started smoking marjauana to increase her appetite. Pt states she lost her appetite when she found out she had cancer in November 2016, before that time she was trying to lose wt through walking more and making food changes. Pt states she will not eat without smoking marijuana. Pt states she has not told any of her physicians she currently smokes. Pt states she has never talked to a mental health professional.     Plan:     Dietitian educated the patient on implementing a plant based diet by incorporating more plant proteins, fruits, and vegetables. As a part of a healthy routine physical activity was discussed. Dietitian advised she tell her physicians she is smoking marijuana and why. Dietitian advised she speak with a mental health professional due to her lack of appetite and to ask the LCSW for local resources. The importance of legitimate, evidence based information was discussed and examples were given. A folder of evidence based information with a focus on a plant based diet and general nutrition during cancer was given to the patient.  As a part of the continuum of care the cancer dietitian's contact information was given to the patient in the event they would like to have a follow up appointment.

## 2016-02-10 NOTE — Patient Instructions (Signed)

## 2016-02-10 NOTE — Therapy (Signed)
[place], [place], [address] Phone: [phone]   Fax:  [phone]  Physical Therapy Evaluation  Patient Details  Name: Rebecca [name] MRN: [id number] Date of Birth: 1976/01/17 Referring Provider: Dr. Alphonsa Overall  Encounter Date: 02/10/2016      PT End of Session - 02/10/16 1216    Visit Number 1   Number of Visits 1   PT Start Time 0925   PT Stop Time 0950  Also saw pt from 1045-1050   PT Time Calculation (min) 25 min   Activity Tolerance Patient tolerated treatment well   Behavior During Therapy [facility] for tasks assessed/performed      Past Medical History  Diagnosis Date  . Hypertension   . Breast cancer ([place])   . Depression   . Anxiety     Past Surgical History  Procedure Laterality Date  . Cesarean section      2003    There were no vitals filed for this visit.  Visit Diagnosis:  Carcinoma of upper-outer quadrant of right female breast [facility]) - Plan: PT plan of care cert/re-cert  Abnormal posture - Plan: PT plan of care cert/re-cert      Subjective Assessment - 02/10/16 1202    Subjective Patient reports being diagnosed with right breast cancer on 01/19/16 and was seen today for a baseline assessment of her newly diagnosed right breast cancer.   Patient is accompained by: Family member   Pertinent History Patient was diagnosed on 01/19/16 with right Triple negative grade 3 invasive ductal carcinoma measuring 2.6 cm in the upper outer quadrant.  The ki67 is 70%.   Patient Stated Goals reduce lymphedema risk and learn post op shoulder ROM HEP   Currently in Pain? Yes   Pain Score 8    Pain Location Back   Pain Orientation Lower   Pain Descriptors / Indicators Sharp   Pain Type Chronic pain   Pain Onset More than a month ago   Pain Frequency Intermittent   Aggravating Factors  Standing   Pain Relieving Factors Sitting   Multiple Pain Sites No            [facility] PT Assessment -  02/10/16 0001    Assessment   Medical Diagnosis Right triple negative breast cancer   Referring Provider Dr. Alphonsa Overall   Onset Date/Surgical Date 01/19/16   Hand Dominance Left   Prior Therapy none   Precautions   Precautions Other (comment)   Precaution Comments active breast cancer   Restrictions   Weight Bearing Restrictions No   Balance Screen   Has the patient fallen in the past 6 months No   Has the patient had a decrease in activity level because of a fear of falling?  No   Is the patient reluctant to leave their home because of a fear of falling?  No   Home Environment   Living Environment Private residence   Living Arrangements Children  Lives with 48, 64, and 38 y.o children; 2 have disabilities   Available Help at Discharge Family  Mother lives close by   Prior Function   Level of [place] Unemployed  Previously worked as a Quarry manager   Leisure She walks her son to school (10 minutes each way)   Cognition   Overall Cognitive Status Within Functional Limits for tasks assessed   Posture/Postural Control   Posture/Postural Control Postural limitations   Postural Limitations Forward head;Rounded Shoulders   ROM /  Strength   AROM / PROM / Strength AROM;Strength   AROM   AROM Assessment Site Shoulder   Right/Left Shoulder Right;Left   Right Shoulder Extension 50 Degrees   Right Shoulder Flexion 150 Degrees   Right Shoulder ABduction 173 Degrees   Right Shoulder Internal Rotation 66 Degrees   Right Shoulder External Rotation 83 Degrees   Left Shoulder Extension 55 Degrees   Left Shoulder Flexion 152 Degrees   Left Shoulder ABduction 162 Degrees   Left Shoulder Internal Rotation 58 Degrees   Left Shoulder External Rotation 83 Degrees   Strength   Overall Strength Within functional limits for tasks performed           LYMPHEDEMA/ONCOLOGY QUESTIONNAIRE - 02/10/16 1214    Type   Cancer Type Right breast cancer   Lymphedema Assessments    Lymphedema Assessments Upper extremities   Right Upper Extremity Lymphedema   10 cm Proximal to Olecranon Process 35.7 cm   Olecranon Process 25.8 cm   10 cm Proximal to Ulnar Styloid Process 25.8 cm   Just Proximal to Ulnar Styloid Process 17.5 cm   Across Hand at [employer] 19.6 cm   At [place] of 2nd Digit 6.6 cm   Left Upper Extremity Lymphedema   10 cm Proximal to Olecranon Process 36.5 cm   Olecranon Process 26.6 cm   10 cm Proximal to Ulnar Styloid Process 24.6 cm   Just Proximal to Ulnar Styloid Process 17.4 cm   Across Hand at [employer] 19.6 cm   At [place] of 2nd Digit 6.5 cm      Patient was instructed today in a home exercise program today for post op shoulder range of motion. These included active assist shoulder flexion in sitting, scapular retraction, wall walking with shoulder abduction, and hands behind head external rotation.  She was encouraged to do these twice a day, holding 3 seconds and repeating 5 times when permitted by her physician.        PT Education - 02/10/16 1215    Education provided Yes   Education Details Lymphedema risk reduction and post op shoulder ROM HEP   Person(s) Educated Patient;Parent(s)   Methods Explanation;Demonstration;Handout   Comprehension Returned demonstration;Verbalized understanding              Breast Clinic Goals - 02/10/16 1222    Patient will be able to verbalize understanding of pertinent lymphedema risk reduction practices relevant to her diagnosis specifically related to skin care.   Time 1   Period Days   Status Achieved   Patient will be able to return demonstrate and/or verbalize understanding of the post-op home exercise program related to regaining shoulder range of motion.   Time 1   Period Days   Status Achieved   Patient will be able to verbalize understanding of the importance of attending the postoperative After Breast Cancer Class for further lymphedema risk reduction education and  therapeutic exercise.   Time 1   Period Days   Status Achieved              Plan - 02/10/16 1216    Clinical Impression Statement Patient was diagnosed on 01/19/16 with right Triple negative grade 3 invasive ductal carcinoma measuring 2.6 cm in the upper outer quadrant.  The ki67 is 70%.  She is planning to have neoadjuvant chemotherapy followed by a right lumpectomy and sentinel node biopsy and radiation.  She may benefit from post op PT to regain shoulder ROM and strength  and reduce lymphedema risk.   Pt will benefit from skilled therapeutic intervention in order to improve on the following deficits Decreased strength;Increased edema;Pain;Decreased knowledge of precautions;Impaired UE functional use;Decreased range of motion   Rehab Potential Excellent   Clinical Impairments Affecting Rehab Potential Limited to 3 visits per year due to having Medicaid   PT Frequency One time visit   PT Treatment/Interventions Therapeutic exercise;Patient/family education   PT Next Visit Plan f/u after surgery   PT Home Exercise Plan Post op shoulder ROM HEP   Consulted and Agree with Plan of Care Patient;Family member/caregiver   Family Member Consulted Mother     Patient will follow up at outpatient cancer rehab if needed following surgery.  If the patient requires physical therapy at that time, a specific plan will be dictated and sent to the referring physician for approval. The patient was educated today on appropriate basic range of motion exercises to begin post operatively and the importance of attending the After Breast Cancer class following surgery.  Patient was educated today on lymphedema risk reduction practices as it pertains to recommendations that will benefit the patient immediately following surgery.  She verbalized good understanding.  No additional physical therapy is indicated at this time.       Problem List Patient Active Problem List   Diagnosis Date Noted  . Breast cancer  of upper-outer quadrant of right female breast ([place]) 02/04/2016  . Hirsutism 01/13/2016  . Tobacco dependence 01/13/2016  . Irregular periods/menstrual cycles 01/13/2016  . Breast mass, right 01/12/2016    Annia Friendly, PT 02/10/2016 12:25 PM  [place], [place], [address] Phone: [phone]   Fax:  [phone]  Name: Rebecca [name] MRN: [id number] Date of Birth: [birth year]

## 2016-02-11 ENCOUNTER — Encounter: Payer: Self-pay | Admitting: Oncology

## 2016-02-11 MED ORDER — PROCHLORPERAZINE MALEATE 10 MG PO TABS: 10.0000 mg | ORAL_TABLET | Freq: Four times a day (QID) | ORAL | Status: DC | PRN

## 2016-02-11 MED ORDER — LIDOCAINE-PRILOCAINE 2.5-2.5 % EX CREA: TOPICAL_CREAM | CUTANEOUS | Status: DC

## 2016-02-11 MED ORDER — LORAZEPAM 0.5 MG PO TABS: 0.5000 mg | ORAL_TABLET | Freq: Every day | ORAL | Status: DC

## 2016-02-11 MED ORDER — DEXAMETHASONE 4 MG PO TABS: ORAL_TABLET | ORAL | Status: DC

## 2016-02-11 NOTE — Progress Notes (Signed)
[place]  Telephone:(336) [phone] Fax:(336) [phone]     ID: Rebecca [name] DOB: 02/03/1976  MR#: [id number]  YQI#:[id number]  Patient Care Team: Dorena Dew, FNP as PCP - General (Family Medicine) Alphonsa Overall, MD as Consulting Physician (General Surgery) Chauncey Cruel, MD as Consulting Physician (Oncology) Gery Pray, MD as Consulting Physician (Radiation Oncology) Sylvan Cheese, NP as Nurse Practitioner (Hematology and Oncology) PCP: Dorena Dew, FNP GYN: OTHER MD:  CHIEF COMPLAINT: triple negative breast cancer  [name] TREATMENT: neoadjuvant chemotherapy   BREAST CANCER HISTORY: Rebecca [name] herself noted a change in her right breast sometime around September or October 2016. She did not bring it to intermediate medical attention, but on 01/13/2016 she established herself in Dr. Smith Robert' service and she was set up for bilateral diagnostic mammography with tomosynthesis and bilateral ultrasonography at the [facility] 01/19/2016. The breast density was category B. In the upper outer quadrant of the right breast there was a spiculated mass measuring 2.8 cm. On physical exam this was palpable. Targeted ultrasonography confirmed an irregular hypoechoic mass in the right breast 11:30 o'clock position measuring 2.6 cm maximally. Ultrasound of the right axilla showed a morphologically abnormal lymph node.  In the left breast there were some tubular densities behind the areola which by ultrasonography showed benign ductal ectasia.  On 01/28/2016 Rebecca [name] underwent biopsy of the right breast mass and abnormal right axillary lymph node. The pathology from this procedure (S [id number]) showed the lymph node to be benign. In the breast however there was an invasive ductal carcinoma, grade 3, which was estrogen and progesterone receptor negative. The proliferation marker was 70%. HER-2 was not amplified with a signals ratio of 1.32. The number per cell was  2.05.  The patient's subsequent history is as detailed below  INTERVAL HISTORY: Rebecca [name]  was evaluated in the multidisciplinary breast cancer clinic 02/10/2016, accompanied by her mother Rebecca [name]. Her case was also presented in the multidisciplinary breast cancer conference that same morning. At that time a preliminary plan was proposed: Neoadjuvant chemotherapy with genetics referral, followed by surgery and radiation.   REVIEW OF SYSTEMS: Rebecca [name] complains of insomnia. She describes herself is severely fatigued all the time. She is gaining weight. She has pain in the right breast which is aching and throbbing and she also has back pain, which is achy and makes it difficult for her to walk long distances. She has seasonal allergies, with sinus problems, and a dry cough. She sleeps on 2 pillows. Her appetite is poor. She has heartburn. She has stress urinary incontinence. She has eczema. She has occasional headaches. She feels forgetful. She is anxious and depressed. She has a history of sickle cell trait. A detailed review of systems was otherwise noncontributory.   PAST MEDICAL HISTORY: Past Medical History  Diagnosis Date  . Hypertension   . Breast cancer ([place])   . Depression   . Anxiety   . Sickle cell trait ([place])   . Obesity (BMI 35.0-39.9 without comorbidity) ([place])     PAST SURGICAL HISTORY: Past Surgical History  Procedure Laterality Date  . Cesarean section      2003    FAMILY HISTORY Family History  Problem Relation Age of Onset  . Hypertension Mother   . Uterine cancer Mother   . Cancer Father   . Hypertension Father   . Breast cancer Maternal Aunt   . Prostate cancer Maternal Uncle   . Breast cancer Maternal Grandmother   . Throat cancer Maternal Grandfather   .  Breast cancer Cousin   The patient has very little information about her father. Her mother is currently 68 years old. She had a history of cervical cancer at age 70. The patient had 2 brothers, no sisters. The  maternal grandfather had throat cancer. A maternal uncle was diagnosed with breast cancer as well as prostate cancer at the age of 37. 2 maternal cousins, one of the mail, had breast cancer as well.  GYNECOLOGIC HISTORY:  Patient's last menstrual period was 01/04/2016. Menarche age 20, first live birth age 14. The patient is GX P4. She still having regular periods. She took oral contraceptives in the 1990s with no side effects.  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 The patient's significant other [name] works at [employer]. In addition to them the patient's 3 children Rebecca [name], Rebecca [name] and Rebecca [name] are also in the home. There are age 46, 30, and 52. The patient's son Rebecca [name], currently 46 years old, lives in [place].    ADVANCED DIRECTIVES: Not in place   HEALTH MAINTENANCE: Social History  Substance Use Topics  . Smoking status: [name] Every Day Smoker -- 0.50 packs/day    Types: Cigarettes  . Smokeless tobacco: Never Used  . Alcohol Use: Yes     Comment: occ     Colonoscopy:  PAP:  Bone density:  Lipid panel:  No Known Allergies  [name] Outpatient Prescriptions  Medication Sig Dispense Refill  . LORazepam (ATIVAN) 0.5 MG tablet Take 1 tablet (0.5 mg total) by mouth every 6 (six) hours as needed (nausea and vomiting). 30 tablet 0   No [name] facility-administered medications for this visit.    OBJECTIVE: [name] in no acute distress Filed Vitals:   02/10/16 0910  BP: 131/91  Pulse: 77  Temp: 98 F (36.7 C)  Resp: 18     Body mass index is 35.41 kg/(m^2).    ECOG FS:1 - Symptomatic but completely ambulatory  Ocular: Sclerae unicteric, pupils equal, round and reactive to light Ear-nose-throat: Oropharynx clear and moist Lymphatic: No cervical or supraclavicular adenopathy Lungs no rales or rhonchi, good excursion bilaterally Heart  regular rate and rhythm, no murmur appreciated Abd soft, nontender, positive bowel sounds MSK no focal spinal tenderness, no joint edema Neuro: non-focal, well-oriented, appropriate affect Breasts: There is a palpable mobile mass in the upper outer quadrant of the breast, almost centrally, with some skin dimpling but no associated ulceration or erythema. The nipple is unremarkable as is the right axilla. The left breast is benign   LAB RESULTS:  CMP     Component Value Date/Time   NA 140 02/10/2016 0852   NA 140 01/12/2016 1115   K 3.9 02/10/2016 0852   K 4.0 01/12/2016 1115   CL 107 01/12/2016 1115   CO2 24 02/10/2016 0852   CO2 22 01/12/2016 1115   GLUCOSE 91 02/10/2016 0852   GLUCOSE 69 01/12/2016 1115   BUN 6.6* 02/10/2016 0852   BUN 7 01/12/2016 1115   CREATININE 0.8 02/10/2016 0852   CREATININE 0.75 01/12/2016 1115   CREATININE 0.80 05/19/2012 1817   CALCIUM 9.1 02/10/2016 0852   CALCIUM 8.8 01/12/2016 1115   PROT 7.4 02/10/2016 0852   PROT 7.1 01/12/2016 1115   ALBUMIN 3.8 02/10/2016 0852   ALBUMIN 4.2 01/12/2016 1115   AST 11 02/10/2016 0852   AST 12 01/12/2016 1115   ALT <9  02/10/2016 0852   ALT 8 01/12/2016 1115   ALKPHOS 51 02/10/2016 0852   ALKPHOS 47 01/12/2016 1115   BILITOT <0.30 02/10/2016 0852   BILITOT 0.4 01/12/2016 1115   GFRNONAA >89 01/12/2016 1115   GFRAA >89 01/12/2016 1115    INo results found for: SPEP, UPEP  Lab Results  Component Value Date   WBC 11.1* 02/10/2016   NEUTROABS 7.5* 02/10/2016   HGB 12.2 02/10/2016   HCT 35.6 02/10/2016   MCV 89.4 02/10/2016   PLT 224 02/10/2016      Chemistry      Component Value Date/Time   NA 140 02/10/2016 0852   NA 140 01/12/2016 1115   K 3.9 02/10/2016 0852   K 4.0 01/12/2016 1115   CL 107 01/12/2016 1115   CO2 24 02/10/2016 0852   CO2 22 01/12/2016 1115   BUN 6.6* 02/10/2016 0852   BUN 7 01/12/2016 1115   CREATININE 0.8 02/10/2016 0852   CREATININE 0.75 01/12/2016 1115   CREATININE  0.80 05/19/2012 1817      Component Value Date/Time   CALCIUM 9.1 02/10/2016 0852   CALCIUM 8.8 01/12/2016 1115   ALKPHOS 51 02/10/2016 0852   ALKPHOS 47 01/12/2016 1115   AST 11 02/10/2016 0852   AST 12 01/12/2016 1115   ALT <9 02/10/2016 0852   ALT 8 01/12/2016 1115   BILITOT <0.30 02/10/2016 0852   BILITOT 0.4 01/12/2016 1115       No results found for: LABCA2  No components found for: GBTDV761  No results for input(s): INR in the last 168 hours.  Urinalysis    Component Value Date/Time   LABSPEC 1.020 01/12/2016 1122   PHURINE 5.5 01/12/2016 1122   GLUCOSEU NEGATIVE 01/12/2016 1122   HGBUR TRACE* 01/12/2016 1122   BILIRUBINUR NEGATIVE 01/12/2016 1122   KETONESUR NEGATIVE 01/12/2016 1122   PROTEINUR NEGATIVE 01/12/2016 1122   UROBILINOGEN 0.2 01/12/2016 1122   NITRITE NEGATIVE 01/12/2016 1122   LEUKOCYTESUR NEGATIVE 01/12/2016 1122      ELIGIBLE FOR AVAILABLE RESEARCH PROTOCOL: PREVENT?; ALLIANCE  STUDIES: Mm Digital Diagnostic Unilat R  01/28/2016  CLINICAL DATA:  Post biopsy mammogram of the right breast for clip placement. EXAM: DIAGNOSTIC RIGHT MAMMOGRAM POST ULTRASOUND BIOPSY COMPARISON:  Previous exam(s). FINDINGS: Mammographic images were obtained following ultrasound guided biopsy of mass in the right breast at 1130, and a lymph node in the right axilla. A ribbon shaped biopsy marking clip is appropriately positioned within the mass in the right breast at 1130. A spring shaped biopsy marking clip is visualized within a lymph node seen only on the MLO view in the right axilla. IMPRESSION: 1. Appropriate positioning of the ribbon shaped biopsy marking clip within the right breast mass at 1130. 2. A spring shaped biopsy marking clip is visualized on a single view in the right axilla. Final Assessment: Post Procedure Mammograms for Marker Placement Electronically Signed   By: [name] Ferrier M.D.   On: 01/28/2016 14:47   US Breast Ltd Uni Left Inc  Axilla  01/19/2016  CLINICAL DATA:  Patient presents today with a palpable lump in the right breast. Extensive family history of breast cancer. This is patient's baseline mammogram. EXAM: DIGITAL DIAGNOSTIC BILATERAL MAMMOGRAM WITH 3D TOMOSYNTHESIS WITH CAD ULTRASOUND BILATERAL BREAST COMPARISON:  No prior exams. ACR Breast Density Category b: There are scattered areas of fibroglandular density. FINDINGS: There is a spiculated mass within the central right breast, slightly upper outer quadrant, at middle to posterior depth, measuring approximately 2.8  cm greatest dimension, corresponding to the palpable abnormality with overlying skin marker in place. No additional masses or suspicious calcifications within the right breast. There are no dominant masses, suspicious calcifications or secondary signs of malignancy within the left breast. Tubular densities within the retroareolar left breast are suggestive of duct ectasia. Ultrasound will be performed to confirm. Mammographic images were processed with CAD. On physical exam, a firm palpable mass is identified within the slightly upper outer quadrant of the right breast. Targeted ultrasound is performed, showing an irregular hypoechoic mass within the right breast at the 11:30 o'clock axis, 5 cm from the nipple, measuring 2.6 x 2.3 x 2.5 cm, corresponding to the palpable abnormality. Ultrasound of the right axilla showed a morphologically abnormal lymph node with asymmetric cortical thickening to 5 mm. Ultrasound was performed of the retroareolar left breast showing multiple mildly prominent retroareolar ducts with internal debris compatible with benign duct ectasia. Similar-appearing ducts are present within the retroareolar right breast. IMPRESSION: 1. Spiculated mass within the RIGHT breast at the 11:30 o'clock axis, 5 cm from the nipple, measuring 2.6 x 2.3 x 2.5 cm, corresponding to patient's palpable abnormality. This is a highly suspicious finding for which  ultrasound-guided biopsy is recommended. 2. Morphologically abnormal lymph node within the right axilla, mildly prominent with asymmetric cortex thickening to 5 mm, for which ultrasound-guided biopsy is also recommended. RECOMMENDATION: Ultrasound-guided biopsy of the right breast mass at the 11:30 o'clock axis. Ultrasound-guided biopsy of the morphologically abnormal lymph node in the right axilla. I have discussed the findings and recommendations with the patient. Results were also provided in writing at the conclusion of the visit. If applicable, a reminder letter will be sent to the patient regarding the next appointment. BI-RADS CATEGORY  5: Highly suggestive of malignancy. Electronically Signed   By: Franki Cabot M.D.   On: 01/19/2016 13:58   US Breast Ltd Uni Right Inc Axilla  01/19/2016  CLINICAL DATA:  Patient presents today with a palpable lump in the right breast. Extensive family history of breast cancer. This is patient's baseline mammogram. EXAM: DIGITAL DIAGNOSTIC BILATERAL MAMMOGRAM WITH 3D TOMOSYNTHESIS WITH CAD ULTRASOUND BILATERAL BREAST COMPARISON:  No prior exams. ACR Breast Density Category b: There are scattered areas of fibroglandular density. FINDINGS: There is a spiculated mass within the central right breast, slightly upper outer quadrant, at middle to posterior depth, measuring approximately 2.8 cm greatest dimension, corresponding to the palpable abnormality with overlying skin marker in place. No additional masses or suspicious calcifications within the right breast. There are no dominant masses, suspicious calcifications or secondary signs of malignancy within the left breast. Tubular densities within the retroareolar left breast are suggestive of duct ectasia. Ultrasound will be performed to confirm. Mammographic images were processed with CAD. On physical exam, a firm palpable mass is identified within the slightly upper outer quadrant of the right breast. Targeted ultrasound is  performed, showing an irregular hypoechoic mass within the right breast at the 11:30 o'clock axis, 5 cm from the nipple, measuring 2.6 x 2.3 x 2.5 cm, corresponding to the palpable abnormality. Ultrasound of the right axilla showed a morphologically abnormal lymph node with asymmetric cortical thickening to 5 mm. Ultrasound was performed of the retroareolar left breast showing multiple mildly prominent retroareolar ducts with internal debris compatible with benign duct ectasia. Similar-appearing ducts are present within the retroareolar right breast. IMPRESSION: 1. Spiculated mass within the RIGHT breast at the 11:30 o'clock axis, 5 cm from the nipple, measuring 2.6 x 2.3 x  2.5 cm, corresponding to patient's palpable abnormality. This is a highly suspicious finding for which ultrasound-guided biopsy is recommended. 2. Morphologically abnormal lymph node within the right axilla, mildly prominent with asymmetric cortex thickening to 5 mm, for which ultrasound-guided biopsy is also recommended. RECOMMENDATION: Ultrasound-guided biopsy of the right breast mass at the 11:30 o'clock axis. Ultrasound-guided biopsy of the morphologically abnormal lymph node in the right axilla. I have discussed the findings and recommendations with the patient. Results were also provided in writing at the conclusion of the visit. If applicable, a reminder letter will be sent to the patient regarding the next appointment. BI-RADS CATEGORY  5: Highly suggestive of malignancy. Electronically Signed   By: Franki Cabot M.D.   On: 01/19/2016 13:58   Mm Diag Breast Tomo Bilateral  01/19/2016  CLINICAL DATA:  Patient presents today with a palpable lump in the right breast. Extensive family history of breast cancer. This is patient's baseline mammogram. EXAM: DIGITAL DIAGNOSTIC BILATERAL MAMMOGRAM WITH 3D TOMOSYNTHESIS WITH CAD ULTRASOUND BILATERAL BREAST COMPARISON:  No prior exams. ACR Breast Density Category b: There are scattered areas of  fibroglandular density. FINDINGS: There is a spiculated mass within the central right breast, slightly upper outer quadrant, at middle to posterior depth, measuring approximately 2.8 cm greatest dimension, corresponding to the palpable abnormality with overlying skin marker in place. No additional masses or suspicious calcifications within the right breast. There are no dominant masses, suspicious calcifications or secondary signs of malignancy within the left breast. Tubular densities within the retroareolar left breast are suggestive of duct ectasia. Ultrasound will be performed to confirm. Mammographic images were processed with CAD. On physical exam, a firm palpable mass is identified within the slightly upper outer quadrant of the right breast. Targeted ultrasound is performed, showing an irregular hypoechoic mass within the right breast at the 11:30 o'clock axis, 5 cm from the nipple, measuring 2.6 x 2.3 x 2.5 cm, corresponding to the palpable abnormality. Ultrasound of the right axilla showed a morphologically abnormal lymph node with asymmetric cortical thickening to 5 mm. Ultrasound was performed of the retroareolar left breast showing multiple mildly prominent retroareolar ducts with internal debris compatible with benign duct ectasia. Similar-appearing ducts are present within the retroareolar right breast. IMPRESSION: 1. Spiculated mass within the RIGHT breast at the 11:30 o'clock axis, 5 cm from the nipple, measuring 2.6 x 2.3 x 2.5 cm, corresponding to patient's palpable abnormality. This is a highly suspicious finding for which ultrasound-guided biopsy is recommended. 2. Morphologically abnormal lymph node within the right axilla, mildly prominent with asymmetric cortex thickening to 5 mm, for which ultrasound-guided biopsy is also recommended. RECOMMENDATION: Ultrasound-guided biopsy of the right breast mass at the 11:30 o'clock axis. Ultrasound-guided biopsy of the morphologically abnormal lymph  node in the right axilla. I have discussed the findings and recommendations with the patient. Results were also provided in writing at the conclusion of the visit. If applicable, a reminder letter will be sent to the patient regarding the next appointment. BI-RADS CATEGORY  5: Highly suggestive of malignancy. Electronically Signed   By: Franki Cabot M.D.   On: 01/19/2016 13:58   [place] Rt Breast Bx W Loc Dev 1st Lesion Img Bx Spec US Guide  01/29/2016  ADDENDUM REPORT: 01/29/2016 12:55 ADDENDUM: Pathology revealed grade III invasive ductal carcinoma in the right breast and a benign right axillary lymph node. This was found to be concordant by Dr. Dorise Bullion. Pathology results were discussed with the patient by telephone. The patient reported  doing well after the biopsy with tenderness at the sites. Post biopsy instructions and care were reviewed and questions were answered. The patient was encouraged to call [facility] for any additional concerns. The patient was referred to the [facility] at the [facility] on February 10, 2016. Pathology results reported by Susa Raring RN, BSN on 01/29/2016. Electronically Signed   By: Dorise Bullion III M.D   On: 01/29/2016 12:55  01/29/2016  CLINICAL DATA:  Right breast mass EXAM: ULTRASOUND GUIDED RIGHT BREAST CORE NEEDLE BIOPSY COMPARISON:  Previous exam(s). FINDINGS: I met with the patient and we discussed the procedure of ultrasound-guided biopsy, including benefits and alternatives. We discussed the high likelihood of a successful procedure. We discussed the risks of the procedure, including infection, bleeding, tissue injury, clip migration, and inadequate sampling. Informed written consent was given. The usual time-out protocol was performed immediately prior to the procedure. Using sterile technique and 1% Lidocaine as local anesthetic, under direct ultrasound visualization, a 14 gauge  spring-loaded device was used to perform biopsy of a right breast mass using a lateral approach. At the conclusion of the procedure a ribbon shaped tissue marker clip was deployed into the biopsy cavity. Follow up 2 view mammogram was performed and dictated separately. IMPRESSION: Ultrasound guided biopsy of the right breast mass. No apparent complications. Electronically Signed: By: Dorise Bullion III M.D On: 01/28/2016 14:57   [place] Rt Breast Bx W Loc Dev Ea Add Lesion Img Bx Spec US Guide  01/29/2016  ADDENDUM REPORT: 01/29/2016 12:55 ADDENDUM: Pathology revealed grade III invasive ductal carcinoma in the right breast and a benign right axillary lymph node. This was found to be concordant by Dr. Dorise Bullion. Pathology results were discussed with the patient by telephone. The patient reported doing well after the biopsy with tenderness at the sites. Post biopsy instructions and care were reviewed and questions were answered. The patient was encouraged to call [facility] for any additional concerns. The patient was referred to the [facility] at the [facility] on February 10, 2016. Pathology results reported by Susa Raring RN, BSN on 01/29/2016. Electronically Signed   By: Dorise Bullion III M.D   On: 01/29/2016 12:55  01/29/2016  CLINICAL DATA:  Right axillary lymph node with thickened cortex. EXAM: ULTRASOUND GUIDED CORE NEEDLE BIOPSY OF A right AXILLARY NODE COMPARISON:  Previous exam(s). FINDINGS: I met with the patient and we discussed the procedure of ultrasound-guided biopsy, including benefits and alternatives. We discussed the high likelihood of a successful procedure. We discussed the risks of the procedure, including infection, bleeding, tissue injury, clip migration, and inadequate sampling. Informed written consent was given. The usual time-out protocol was performed immediately prior to the procedure. Using sterile  technique and 1% Lidocaine as local anesthetic, under direct ultrasound visualization, a 14 gauge spring-loaded device was used to perform biopsy of a right axillary lymph node using a lateral approach. At the conclusion of the procedure a HydroMARK tissue clip was deployed into the biopsy cavity. Follow up 2 view mammogram was performed and dictated separately. IMPRESSION: Ultrasound guided biopsy of a right axillary. No apparent complications. Electronically Signed: By: Dorise Bullion III M.D On: 01/28/2016 14:58    ASSESSMENT: 40 y.o. [place] woman status post right breast biopsy 01/28/2016 for a clinical T2 N0 invasive ductal carcinoma, grade 3, triple negative, with an MIB-1 of 70%.  (a) suspicious  right axillary lymph node biopsied 01/28/2016 was benign  (1) neoadjuvant chemotherapy will consist of doxorubicin and cyclophosphamide in dose dense fashion 4 followed by paclitaxel and carboplatin weekly 12  (2) genetics testing is pending  (3) definitive surgery to follow  (5) adjuvant radiation to follow as appropriate  PLAN: We spent the better part of today's hour-long appointment discussing the biology of breast cancer in general, and the specifics of the patient's tumor in particular. [name] understands well the difference between local and systemic therapy for breast cancer. She also understands that in terms of sequencing with her we do chemotherapy first or surgery first does not alter the ultimate outcome. The survival is the same either way.  In her case we think starting with chemotherapy is a good idea. It will make the surgery easier. More importantly it will give her time to get her genetics testing done and consider the results. She is very likely to carry a deleterious mutation and if she does she may choose to have bilateral mastectomies.  She understands that even if she does carry a deleterious mutation bilateral mastectomies are not unavoidable. We could do yearly  intensified follow-up with mammography and MRI. However most young patients in my experience that chosen bilateral mastectomies with reconstruction and that will involve a lengthy process of learning the reconstruction options as well as making the decision itself.  For those reasons we will be starting with neoadjuvant chemotherapy. She will receive cyclophosphamide and doxorubicin in dose dense fashion 4 followed by paclitaxel and carboplatin weekly 11. We are hoping to start 02/29/2016. Before that date she will have had an echocardiogram, port placement, and have participated in chemotherapy school. She will also meet with me or our nurse practitioner to discuss supportive medications.  She appears to be a good candidate for the PREVENT and ALLIANCE trials. Those referrals were placed 02/11/2016  The patient has a good understanding of the overall plan. She agrees with it. She knows the goal of treatment in her case is cure. She will call with any problems that may develop before her next visit here.  Chauncey Cruel, MD   02/11/2016 2:19 PM Medical Oncology and Hematology [facility] [address] [place], [place] [address] Tel. [phone]    Fax. [phone]   Thank you

## 2016-02-12 ENCOUNTER — Telehealth: Payer: Self-pay | Admitting: Oncology

## 2016-02-12 ENCOUNTER — Encounter: Payer: Self-pay | Admitting: *Deleted

## 2016-02-12 ENCOUNTER — Telehealth: Payer: Self-pay | Admitting: *Deleted

## 2016-02-12 NOTE — Telephone Encounter (Signed)
s.w. pt and advised on all appts....pt ok and aware °

## 2016-02-12 NOTE — Telephone Encounter (Signed)
Per staff message and POF I have scheduled appts. Advised scheduler of appts. JMW  

## 2016-02-15 ENCOUNTER — Inpatient Hospital Stay: Admission: RE | Admit: 2016-02-15 | Payer: Medicaid Other | Source: Ambulatory Visit

## 2016-02-15 ENCOUNTER — Telehealth: Payer: Self-pay | Admitting: *Deleted

## 2016-02-15 NOTE — Telephone Encounter (Signed)
Spoke to pt concerning [place] from 02/10/16. Denies questions or concerns regarding dx or treatment care plan. Discussed and confirmed future appts. Encourage pt to call with needs. Received verbal understanding.

## 2016-02-16 ENCOUNTER — Encounter (HOSPITAL_COMMUNITY): Payer: Self-pay | Admitting: *Deleted

## 2016-02-17 ENCOUNTER — Encounter: Payer: Self-pay | Admitting: Oncology

## 2016-02-17 NOTE — Progress Notes (Signed)
Spoke w/ pt regarding copay assistance.  Informed her that her ins will pay for her treatment at 100%.  She's not working so I offered the [place] went over what it will cover.  I will give her an expense sheet on 02/24/16 when she brings in her SS letter to see if she qualifies for the grant.

## 2016-02-18 ENCOUNTER — Encounter: Payer: Self-pay | Admitting: *Deleted

## 2016-02-18 ENCOUNTER — Encounter (HOSPITAL_COMMUNITY): Payer: Self-pay

## 2016-02-18 ENCOUNTER — Ambulatory Visit (HOSPITAL_COMMUNITY)
Admission: RE | Admit: 2016-02-18 | Discharge: 2016-02-18 | Disposition: A | Discharge status: Home / Self Care | Payer: Medicaid Other | Source: Ambulatory Visit | Attending: Surgery | Admitting: Surgery

## 2016-02-18 ENCOUNTER — Other Ambulatory Visit: Payer: Medicaid Other

## 2016-02-18 ENCOUNTER — Encounter: Payer: Medicaid Other | Admitting: Genetic Counselor

## 2016-02-18 ENCOUNTER — Encounter (HOSPITAL_COMMUNITY)
Admission: RE | Disposition: A | Discharge status: Home / Self Care | Payer: Self-pay | Source: Ambulatory Visit | Attending: Surgery

## 2016-02-18 ENCOUNTER — Telehealth: Payer: Self-pay | Admitting: *Deleted

## 2016-02-18 ENCOUNTER — Encounter (HOSPITAL_COMMUNITY): Payer: Self-pay | Admitting: Anesthesiology

## 2016-02-18 ENCOUNTER — Other Ambulatory Visit: Payer: Self-pay | Admitting: Nurse Practitioner

## 2016-02-18 DIAGNOSIS — Z5309 Procedure and treatment not carried out because of other contraindication: Secondary | ICD-10-CM | POA: Diagnosis not present

## 2016-02-18 DIAGNOSIS — D573 Sickle-cell trait: Secondary | ICD-10-CM | POA: Insufficient documentation

## 2016-02-18 DIAGNOSIS — Z803 Family history of malignant neoplasm of breast: Secondary | ICD-10-CM | POA: Insufficient documentation

## 2016-02-18 DIAGNOSIS — Z3201 Encounter for pregnancy test, result positive: Secondary | ICD-10-CM | POA: Diagnosis not present

## 2016-02-18 DIAGNOSIS — F17200 Nicotine dependence, unspecified, uncomplicated: Secondary | ICD-10-CM | POA: Insufficient documentation

## 2016-02-18 DIAGNOSIS — C50911 Malignant neoplasm of unspecified site of right female breast: Secondary | ICD-10-CM | POA: Diagnosis present

## 2016-02-18 HISTORY — DX: Gastro-esophageal reflux disease without esophagitis: K21.9

## 2016-02-18 HISTORY — DX: Other seasonal allergic rhinitis: J30.2

## 2016-02-18 LAB — HCG, QUANTITATIVE, PREGNANCY: HCG, BETA CHAIN, QUANT, S: 12 m[IU]/mL — AB (ref <5)

## 2016-02-18 LAB — HCG, SERUM, QUALITATIVE: Preg, Serum: POSITIVE — AB

## 2016-02-18 LAB — PREGNANCY, URINE
PREG TEST UR: POSITIVE — AB
Preg Test, Ur: NEGATIVE

## 2016-02-18 SURGERY — INSERTION, TUNNELED CENTRAL VENOUS DEVICE, WITH PORT
Anesthesia: General

## 2016-02-18 MED ORDER — CHLORHEXIDINE GLUCONATE 4 % EX LIQD: 1.0000 "application " | Freq: Once | CUTANEOUS | Status: DC

## 2016-02-18 MED ORDER — PROPOFOL 10 MG/ML IV BOLUS
INTRAVENOUS | Status: AC
  Filled 2016-02-18: qty 20

## 2016-02-18 MED ORDER — FENTANYL CITRATE (PF) 100 MCG/2ML IJ SOLN
INTRAMUSCULAR | Status: AC
  Filled 2016-02-18: qty 2

## 2016-02-18 MED ORDER — SODIUM CHLORIDE 0.9 % IV SOLN
Freq: Once | INTRAVENOUS | Status: DC
  Filled 2016-02-18: qty 1.2

## 2016-02-18 MED ORDER — HEPARIN SODIUM (PORCINE) 1000 UNIT/ML IJ SOLN
INTRAMUSCULAR | Status: AC
  Filled 2016-02-18: qty 1

## 2016-02-18 MED ORDER — CEFAZOLIN SODIUM-DEXTROSE 2-3 GM-% IV SOLR: 2.0000 g | INTRAVENOUS | Status: DC

## 2016-02-18 MED ORDER — MIDAZOLAM HCL 2 MG/2ML IJ SOLN
INTRAMUSCULAR | Status: AC
  Filled 2016-02-18: qty 2

## 2016-02-18 NOTE — Anesthesia Preprocedure Evaluation (Addendum)
Anesthesia Evaluation  Patient identified by MRN, date of birth, ID band Patient awake    Reviewed: Allergy & Precautions, NPO status , Patient's Chart, lab work & pertinent test results  Airway        Dental   Pulmonary Current Smoker,           Cardiovascular hypertension,      Neuro/Psych PSYCHIATRIC DISORDERS Anxiety Depression negative neurological ROS     GI/Hepatic Neg liver ROS, GERD  ,  Endo/Other  negative endocrine ROS  Renal/GU negative Renal ROS  negative genitourinary   Musculoskeletal negative musculoskeletal ROS (+)   Abdominal   Peds negative pediatric ROS (+)  Hematology negative hematology ROS (+)   Anesthesia Other Findings   Reproductive/Obstetrics negative OB ROS                             Anesthesia Physical Anesthesia Plan  ASA: II  Anesthesia Plan: General   Post-op Pain Management:    Induction: Intravenous  Airway Management Planned: LMA  Additional Equipment:   Intra-op Plan:   Post-operative Plan: Extubation in OR  Informed Consent: I have reviewed the patients History and Physical, chart, labs and discussed the procedure including the risks, benefits and alternatives for the proposed anesthesia with the patient or authorized representative who has indicated his/her understanding and acceptance.   Dental advisory given  Plan Discussed with: CRNA  Anesthesia Plan Comments: (Case cancelled secondary to positive pregnancy test (Urine & Serum). Dr. [name] does not want to proceed under MAC with local due to elective nature of case. )      Anesthesia Quick Evaluation

## 2016-02-18 NOTE — Discharge Instructions (Signed)
Follow up with OB-Gyn then follow-up with Dr [name] with new plan of care.

## 2016-02-18 NOTE — Progress Notes (Signed)
Per Bary Castilla - Patient is pregnant - Cancel all appt's but Genetics in April. Cancelled appt's as requested.

## 2016-02-18 NOTE — Telephone Encounter (Signed)
Received notification from Dr. [name] that pt had a positive pregnancy test yesterday prior to her surgery.  Called pt to discuss plan. Informed that we will cancel her appts for chemo class, f/u with Nira Conn, NP and chemo class. Discussed with Lexine Baton in research if pt will need to cancel her genetics appt d/t being pregnant. Per Lexine Baton pt will not be able to participate in clinical trial. Informed pt that we will cancer her genetics appt as well for 02/18/16. A call has been placed to see if pt can still have MRI. Waiting for return call. Informed pt to keep her echocardiogram. Pt is going to call the high risk pregnancy clinic for an appt and will call back with appt date and time. Pt denies further needs at this time. Encourage pt to call with questions or concerns. Received verbal understanding.

## 2016-02-18 NOTE — H&P (Signed)
Rebecca [name]  Location: [facility] Surgery Patient #: [id number] DOB: 1976-03-21 Undefined / Language: [name] / Race: Black or African American Female  History of Present Illness   The patient is a 40 year old female who presents with breast cancer.   Her PCP is Dr. Cammie Sickle  She is at the Breast [facility] - [name]. She comes with her mother, Rebecca [name].  this was her first mammogram. She felt something in her right breast. Her last menstrual period was recently. She is not on hormone therapy. She has a very strong family history of breast cancer with a grandmother, and uncle, and a female first cousin with breast cancer.   She had a mammogram at [facility] on 01/28/2016 1. Spiculated mass within the RIGHT breast at the 11:30 o'clock axis, 5 cm from the nipple, measuring 2.6 x 2.3 x 2.5 cm, corresponding to patient's palpable abnormality. 2. Morphologically abnormal lymph node within the right axilla, mildly prominent with asymmetric cortex thickening to 5 mm, for which ultrasound-guided biopsy is also recommended.  She underwent a biopsy on 01/28/2016 ([id number]) which showed grade 3 IDC, ER - 0%, PR - 0%, Ki67 - 70%, Her2Neu - neg. Right axillary lymph node biopsy was benign.  I discussed the options for breast cancer treatment with the patient. She is in the Breast multidisciplinary clinic, which includes medical oncology and radiation oncology. I discussed the surgical options of lumpectomy vs. mastectomy. If mastectomy, there is the possibility of reconstruction. I discussed the options of lymph node biopsy. The treatment plan depends on the pathologic staging of the tumor and the patient's personal wishes. The risks of surgery include, but are not limited to, bleeding, infection, the need for further surgery, and nerve injury. The patient has been given literature on the treatment of breast  cancer.  Plan: 1) Power port placement, 2) Neoadjuvant chemotx, 3) MRI, 4) Genetics (high risk) The final treatment of her breast will depend on the genetics and response to chemotx.  Past Medical History: 1) Smokes I gave her a prescription for Nicoderm. 2) Sickle cell trait - She is seen at the [facility] by Dr. [name]   Social History: Unmarried. (Not really divorced, though she has not seen her husband for 20 years) Has 4 children - 2 are special needs.  [name] - 42 yo in [place], [place] 42 yo, [place] - 42 yo (she has some special need), [name] - 42 yo (he has sickle cell and she has been at [facility] with him) The three youngest live with her.  The "dad" [facility]) helps out, but it does not sound like he lives with her. He has to take care of his own mother. She comes with her mother, Rebecca [name]. She has not worked since last summer (2016). 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   Medication History Conni Slipper, RN; 02/10/2016 7:46 AM) No Current Medications Medications Reconciled   Physical Exam: General: Middle aged [place] and generally healthy appearing. HEENT: Normal. Pupils equal.  Neck: Supple. No mass. No thyroid mass.  Lymph Nodes: No supraclavicular or cervical nodes. She is very tender in her right axilla which limited my exam.  Lungs: Clear to auscultation and symmetric breath sounds. Heart: RRR. No murmur or rub.  Breasts: Right - 2.5 cm mass at the 12 o'clock position, about 2 cm above the areola. Left - No mass or nodule  Abdomen: Soft. No mass. No tenderness. No hernia. Normal bowel sounds.   Extremities: Good strength and ROM in upper and lower extremities.  Neurologic: Grossly intact to motor and sensory function. Psychiatric: Has normal mood and affect. Behavior is normal.   Assessment & Plan  1.  BREAST CANCER, STAGE 1, RIGHT  (C50.911)  Story: She underwent a biopsy on 01/28/2016 ([id number]) which showed grade 3 IDC, ER - 0%, PR - 0%, Ki67 - 70%, Her2Neu - neg. Right axillary lymph node biopsy was benign.   Oncology - Drs. Magrinat and Kinard  Impression: Plan:   1) Power port placement,   2) Neoadjuvant chemotx,   3) Breast MRI,   4) Genetics (high risk)   5) I want to see her every 2 months while she is getting chemotx.   Addendum Note(Anzlee Hinesley H. [name] MD; 02/16/2016 9:25 AM) From [name] 3/14:  [name] was seen in clinic on 3/8. She is scheduled for the following.  Port 3/16 F/u with Heather and chemo class 3/22 MRI 3/23 Echo 3/24 1st Chemo 3/27  2.  Smokes  3.  Pregnancy test came back positive  Will delay surgery until seen by Obstetrics and Dr. [name].  I gave patient a copy of her pregnancy tests.   Alphonsa Overall, MD, [facility] Surgery Pager: [phone] Office phone:  [phone]

## 2016-02-18 NOTE — Telephone Encounter (Signed)
Received call from Elizebeth Koller, RN at the [facility].  Patient newly diagnosed with breast cancer.  Was scheduled to have a port placed today and pregnancy test resulted positive.  Treatment is being held until patient can be seen by OB/GYN.  Beta hCG today = 12.  Spoke with Kathrine Haddock, CNM.  Venia Carbon recommends repeat Beta hCG on Monday and meeting with MD to discuss options.  Phoned patient.  Appointment given for lab draw at 11 am and to come back to the office at 1:45 pm to discuss results and options with provider.  Patient agrees to plan.

## 2016-02-22 ENCOUNTER — Telehealth: Payer: Self-pay | Admitting: *Deleted

## 2016-02-22 ENCOUNTER — Encounter: Payer: Medicaid Other | Admitting: Obstetrics and Gynecology

## 2016-02-22 ENCOUNTER — Ambulatory Visit: Payer: Medicaid Other | Admitting: Obstetrics & Gynecology

## 2016-02-22 NOTE — Telephone Encounter (Signed)
Received call from patient stating she has two kids at home sick and needs to reschedule her appointment at the high risk clinic.  Called and was able to get her appointment rescheduled for 3/23 at 2pm.  Informed patient of date and time.

## 2016-02-23 ENCOUNTER — Encounter: Payer: Self-pay | Admitting: *Deleted

## 2016-02-24 ENCOUNTER — Other Ambulatory Visit: Payer: Medicaid Other

## 2016-02-24 ENCOUNTER — Ambulatory Visit: Payer: Medicaid Other | Admitting: Nurse Practitioner

## 2016-02-25 ENCOUNTER — Other Ambulatory Visit: Payer: Medicaid Other

## 2016-02-25 ENCOUNTER — Other Ambulatory Visit: Payer: Self-pay | Admitting: Oncology

## 2016-02-25 ENCOUNTER — Ambulatory Visit (HOSPITAL_BASED_OUTPATIENT_CLINIC_OR_DEPARTMENT_OTHER): Payer: Self-pay | Admitting: Oncology

## 2016-02-25 ENCOUNTER — Telehealth: Payer: Self-pay | Admitting: Oncology

## 2016-02-25 ENCOUNTER — Ambulatory Visit (INDEPENDENT_AMBULATORY_CARE_PROVIDER_SITE_OTHER): Payer: Medicaid Other | Admitting: Obstetrics & Gynecology

## 2016-02-25 VITALS — BP 118/78 | HR 86 | Temp 97.7°F | Resp 18 | Ht 61.5 in | Wt 187.0 lb

## 2016-02-25 VITALS — BP 113/72 | HR 87 | Temp 98.2°F | Wt 186.1 lb

## 2016-02-25 DIAGNOSIS — C50411 Malignant neoplasm of upper-outer quadrant of right female breast: Secondary | ICD-10-CM

## 2016-02-25 DIAGNOSIS — Z331 Pregnant state, incidental: Secondary | ICD-10-CM

## 2016-02-25 DIAGNOSIS — O3680X0 Pregnancy with inconclusive fetal viability, not applicable or unspecified: Secondary | ICD-10-CM

## 2016-02-25 DIAGNOSIS — Z3490 Encounter for supervision of normal pregnancy, unspecified, unspecified trimester: Secondary | ICD-10-CM

## 2016-02-25 NOTE — Progress Notes (Signed)
   CLINIC ENCOUNTER NOTE  History:  40 y.o. F here today for evaluation of incidentally found early pregnancy; in the setting of management of recently diagnosed breast cancer.  She had a positive UPT, bHCG on 02/18/16 was 12. She is here today for follow up BHCG. She has decided not to keep the pregnancy, and will proceed with treatment for the breast cancer as per her oncologist.  She denies any abnormal vaginal discharge, bleeding, pelvic pain or other concerns.   Past Medical History  Diagnosis Date  . Breast cancer (Rebecca [name])   . Depression   . Anxiety   . Sickle cell trait ([place])   . Obesity (BMI 35.0-39.9 without comorbidity) ([place])   . Hypertension     pt states is currently on no medications   . Seasonal allergies   . GERD (gastroesophageal reflux disease)     Past Surgical History  Procedure Laterality Date  . Cesarean section      2004 and 2007    The following portions of the patient's history were reviewed and updated as appropriate: allergies, current medications, past family history, past medical history, past social history, past surgical history and problem list.    Review of Systems:  Pertinent items noted in HPI and remainder of comprehensive ROS otherwise negative.  Objective:  Physical Exam BP 113/72 mmHg  Pulse 87  Temp(Src) 98.2 F (36.8 C) (Oral)  Wt 186 lb 1.6 oz (84.414 kg)  LMP 01/19/2016 (Exact Date) CONSTITUTIONAL: Well-developed, well-nourished female in no acute distress.  HENT:  Normocephalic, atraumatic. External right and left ear normal. Oropharynx is clear and moist EYES: Conjunctivae and EOM are normal. Pupils are equal, round, and reactive to light. No scleral icterus.  NECK: Normal range of motion, supple, no masses SKIN: Skin is warm and dry. No rash noted. Not diaphoretic. No erythema. No pallor. NEUROLOGIC: Alert and oriented to person, place, and time. Normal reflexes, muscle tone coordination. No cranial nerve deficit  noted. PSYCHIATRIC: Normal mood and affect. Normal behavior. Normal judgment and thought content. CARDIOVASCULAR: Normal heart rate noted RESPIRATORY: Effort and breath sounds normal, no problems with respiration noted ABDOMEN: Soft, no distention noted.   PELVIC: Deferred MUSCULOSKELETAL: Normal range of motion. No edema noted.   Assessment & Plan:  1. Breast cancer of upper-outer quadrant of right female breast [facility]) Will start management as per Oncology team  2. Early stage of pregnancy - hCG, quantitative, pregnancy Will follow up results and manage accordingly.  If patient desires termination, IUP needs to be visualized and this is usually not seen until HCG level >1500. No pain or bleeding, low suspicion for ectopic or SAB at this point.    Verita Schneiders, MD, [place] Attending Obstetrician & Gynecologist, [place] for [facility]

## 2016-02-25 NOTE — Telephone Encounter (Signed)
Unable to reach patient to inform her of sched appts. Letter and calendar sent by mail

## 2016-02-26 ENCOUNTER — Telehealth (HOSPITAL_COMMUNITY): Payer: Self-pay | Admitting: *Deleted

## 2016-02-26 ENCOUNTER — Encounter (HOSPITAL_COMMUNITY): Payer: Self-pay | Admitting: *Deleted

## 2016-02-26 ENCOUNTER — Ambulatory Visit (HOSPITAL_BASED_OUTPATIENT_CLINIC_OR_DEPARTMENT_OTHER): Payer: Medicaid Other

## 2016-02-26 ENCOUNTER — Other Ambulatory Visit: Payer: Self-pay

## 2016-02-26 ENCOUNTER — Ambulatory Visit (HOSPITAL_COMMUNITY)
Admission: RE | Admit: 2016-02-26 | Discharge: 2016-02-26 | Disposition: A | Discharge status: Home / Self Care | Payer: Medicaid Other | Source: Ambulatory Visit | Attending: Cardiology | Admitting: Cardiology

## 2016-02-26 VITALS — BP 108/66 | HR 77 | Wt 189.0 lb

## 2016-02-26 DIAGNOSIS — C50411 Malignant neoplasm of upper-outer quadrant of right female breast: Secondary | ICD-10-CM | POA: Diagnosis not present

## 2016-02-26 DIAGNOSIS — Z720 Tobacco use: Secondary | ICD-10-CM | POA: Diagnosis not present

## 2016-02-26 DIAGNOSIS — Z6834 Body mass index (BMI) 34.0-34.9, adult: Secondary | ICD-10-CM | POA: Diagnosis not present

## 2016-02-26 DIAGNOSIS — Z01810 Encounter for preprocedural cardiovascular examination: Secondary | ICD-10-CM | POA: Insufficient documentation

## 2016-02-26 DIAGNOSIS — E669 Obesity, unspecified: Secondary | ICD-10-CM | POA: Insufficient documentation

## 2016-02-26 DIAGNOSIS — F17200 Nicotine dependence, unspecified, uncomplicated: Secondary | ICD-10-CM | POA: Diagnosis not present

## 2016-02-26 DIAGNOSIS — I10 Essential (primary) hypertension: Secondary | ICD-10-CM | POA: Insufficient documentation

## 2016-02-26 DIAGNOSIS — Z8249 Family history of ischemic heart disease and other diseases of the circulatory system: Secondary | ICD-10-CM | POA: Diagnosis not present

## 2016-02-26 DIAGNOSIS — D573 Sickle-cell trait: Secondary | ICD-10-CM | POA: Diagnosis not present

## 2016-02-26 LAB — HCG, QUANTITATIVE, PREGNANCY: hCG, Beta Chain, Quant, S: 243.8 m[IU]/mL — ABNORMAL HIGH

## 2016-02-26 NOTE — Addendum Note (Signed)
Addended by: Verita Schneiders A on: 02/26/2016 08:17 AM   Modules accepted: Orders

## 2016-02-26 NOTE — Telephone Encounter (Signed)
Have attempted to reach patient via phone several times this morning.  I get a message that says the person you are calling is not available try your call later.  I am sending a certified letter to ensure the patient gets her appointment information for her ultrasound.  I will continue to try to reach the patient via phone later today.

## 2016-02-26 NOTE — Progress Notes (Signed)
Advanced Heart Failure Medication Review by a Pharmacist  Does the patient  feel that his/her medications are working for him/her?  yes  Has the patient been experiencing any side effects to the medications prescribed?  no  Does the patient measure his/her own blood pressure or blood glucose at home?  no   Does the patient have any problems obtaining medications due to transportation or finances?   no  Understanding of regimen: good Understanding of indications: good Potential of compliance: good Patient understands to avoid NSAIDs. Patient understands to avoid decongestants.  Issues to address at subsequent visits: None   Pharmacist comments: 40 YO pleasant female presenting to HF clinic.  Pt states she has not started chemo yet and is only taking ativan PRN and an herbal detox liquid supplement.  Pt denies SE to her current medications.     Time with patient: 5 min  Preparation and documentation time: 5 min  Total time: 10 min

## 2016-02-26 NOTE — Patient Instructions (Signed)
Your physician recommends that you schedule a follow-up appointment in: 3 months with echo

## 2016-02-26 NOTE — Progress Notes (Signed)
[place]  Telephone:([phone]) [phone] Fax:(336) [phone]     ID: Rebecca [name] DOB: Apr 20, 1976  MR#: [id number]  XBD#:[id number]  Patient Care Team: Rebecca Dew, FNP as PCP - General (Family Medicine) Rebecca Overall, MD as Consulting Physician (General Surgery) Rebecca Cruel, MD as Consulting Physician (Oncology) Rebecca Pray, MD as Consulting Physician (Radiation Oncology) Rebecca Cheese, NP as Nurse Practitioner (Hematology and Oncology) PCP: Rebecca Dew, FNP GYN: OTHER MD:  CHIEF COMPLAINT: triple negative breast cancer  [name] TREATMENT: neoadjuvant chemotherapy pending   BREAST CANCER HISTORY: From the original intake note:  Rebecca [name] herself noted a change in her right breast sometime around September or October 2016. She did not bring it to intermediate medical attention, but on 01/13/2016 she established herself in Dr. Smith [name]' service and she was set up for bilateral diagnostic mammography with tomosynthesis and bilateral ultrasonography at the [facility] 01/19/2016. The breast density was category B. In the upper outer quadrant of the right breast there was a spiculated mass measuring 2.8 cm. On physical exam this was palpable. Targeted ultrasonography confirmed an irregular hypoechoic mass in the right breast 11:30 o'clock position measuring 2.6 cm maximally. Ultrasound of the right axilla showed a morphologically abnormal lymph node.  In the left breast there were some tubular densities behind the areola which by ultrasonography showed benign ductal ectasia.  On 01/28/2016 Rebecca [name] underwent biopsy of the right breast mass and abnormal right axillary lymph node. The pathology from this procedure ([id number]) showed the lymph node to be benign. In the breast however there was an invasive ductal carcinoma, grade 3, which was estrogen and progesterone receptor negative. The proliferation marker was 70%. HER-2 was not amplified with a signals ratio  of 1.32. The number per cell was 2.05.  The patient's subsequent history is as detailed below  INTERVAL HISTORY: Rebecca [name] returns today for further discussion of her breast cancer treatment, accompanied by her mother. Since her last visit here, she was found to be pregnant. This is very early, perhaps 42 years into the pregnancy. He was found to routine testing prior to a surgical procedure. She is here to discuss of this affects her treatment options.  REVIEW OF SYSTEMS: Rebecca [name] has some pain in her back and also associated with her prior breast biopsy. She has mild sinus symptoms. She has a little bit of a cough. Her appetite is poor. She continues to have stress urinary incontinence issues. She has some eczema. She feels weak, anxious, and depressed. A detailed review of systems was otherwise stable  PAST MEDICAL HISTORY: Past Medical History  Diagnosis Date  . Breast cancer ([place])   . Depression   . Anxiety   . Sickle cell trait ([place])   . Obesity (BMI 35.0-39.9 without comorbidity) ([place])   . Hypertension     pt states is currently on no medications   . Seasonal allergies   . GERD (gastroesophageal reflux disease)     PAST SURGICAL HISTORY: Past Surgical History  Procedure Laterality Date  . Cesarean section      2004 and 2007    FAMILY HISTORY Family History  Problem Relation Age of Onset  . Hypertension Mother   . Uterine cancer Mother   . Cancer Father   . Hypertension Father   . Breast cancer Maternal Aunt   . Prostate cancer Maternal Uncle   . Breast cancer Maternal Grandmother   . Throat cancer Maternal Grandfather   . Breast cancer Cousin   The  patient has very little information about her father. Her mother is currently 42 years old. She had a history of cervical cancer at age 42. The patient had 2 brothers, no sisters. The maternal grandfather had throat cancer. A maternal uncle was diagnosed with breast cancer as well as prostate cancer at the age of 51. 2 maternal  cousins, one of the mail, had breast cancer as well.  GYNECOLOGIC HISTORY:  Patient's last menstrual period was 01/19/2016 (exact date). Menarche age 40, first live birth age 40. The patient is GX P4. She still having regular periods. She took oral contraceptives in the 1990s with no side effects.  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 The patient's significant other Rebecca [name] works at [employer]. In addition to them the patient's 3 children Rebecca [name], Rebecca [name] and Rebecca [name] are also in the home. There are age 42, 42, and 42. The patient's son Rebecca [name], currently 42 years old, lives in [place].    ADVANCED DIRECTIVES: Not in place   HEALTH MAINTENANCE: Social History  Substance Use Topics  . Smoking status: [name] Every Day Smoker -- 0.25 packs/day for 20 years    Types: Cigarettes  . Smokeless tobacco: Never Used  . Alcohol Use: Yes     Comment: occ     Colonoscopy:  PAP:  Bone density:  Lipid panel:  No Known Allergies  [name] Outpatient Prescriptions  Medication Sig Dispense Refill  . dexamethasone (DECADRON) 4 MG tablet Take 2 tablets by mouth once a day on the day after chemotherapy and then take 2 tablets two times a day for 2 days. Take with food. (Patient not taking: Reported on 02/16/2016) 30 tablet 1  . lidocaine-prilocaine (EMLA) cream Apply to affected area once (Patient not taking: Reported on 02/16/2016) 30 g 3  . LORazepam (ATIVAN) 0.5 MG tablet Take 1 tablet (0.5 mg total) by mouth every 6 (six) hours as needed (nausea and vomiting). (Patient not taking: Reported on 02/26/2016) 30 tablet 0  . LORazepam (ATIVAN) 0.5 MG tablet Take 1 tablet (0.5 mg total) by mouth at bedtime. 30 tablet 0  . OVER THE COUNTER MEDICATION Take 1 Dose by mouth every other day. Herbal over the counter liquid detox    . prochlorperazine (COMPAZINE) 10 MG tablet Take 1 tablet (10 mg total) by  mouth every 6 (six) hours as needed (Nausea or vomiting). (Patient not taking: Reported on 02/16/2016) 30 tablet 1   No [name] facility-administered medications for this visit.    OBJECTIVE: [name] Who appears stated age 89 Vitals:   02/25/16 0900  BP: 118/78  Pulse: 86  Temp: 97.7 F (36.5 C)  Resp: 18     Body mass index is 34.77 kg/(m^2).    ECOG FS:1 - Symptomatic but completely ambulatory  Sclerae unicteric, EOMs intact Oropharynx clear, no thrush or other lesions No cervical or supraclavicular adenopathy Lungs no rales or rhonchi Heart regular rate and rhythm Abd soft, nontender, positive bowel sounds MSK no focal spinal tenderness, no upper extremity lymphedema Neuro: nonfocal, well oriented, appropriate affect Breasts: Deferred   LAB RESULTS:  CMP     Component Value Date/Time   NA 140 02/10/2016 0852   NA 140 01/12/2016 1115   K 3.9 02/10/2016 0852   K 4.0 01/12/2016 1115   CL 107 01/12/2016 1115   CO2 24 02/10/2016 0852   CO2 22 01/12/2016  1115   GLUCOSE 91 02/10/2016 0852   GLUCOSE 69 01/12/2016 1115   BUN 6.6* 02/10/2016 0852   BUN 7 01/12/2016 1115   CREATININE 0.8 02/10/2016 0852   CREATININE 0.75 01/12/2016 1115   CREATININE 0.80 05/19/2012 1817   CALCIUM 9.1 02/10/2016 0852   CALCIUM 8.8 01/12/2016 1115   PROT 7.4 02/10/2016 0852   PROT 7.1 01/12/2016 1115   ALBUMIN 3.8 02/10/2016 0852   ALBUMIN 4.2 01/12/2016 1115   AST 11 02/10/2016 0852   AST 12 01/12/2016 1115   ALT <9 02/10/2016 0852   ALT 8 01/12/2016 1115   ALKPHOS 51 02/10/2016 0852   ALKPHOS 47 01/12/2016 1115   BILITOT <0.30 02/10/2016 0852   BILITOT 0.4 01/12/2016 1115   GFRNONAA >89 01/12/2016 1115   GFRAA >89 01/12/2016 1115    INo results found for: SPEP, UPEP  Lab Results  Component Value Date   WBC 11.1* 02/10/2016   NEUTROABS 7.5* 02/10/2016   HGB 12.2 02/10/2016   HCT 35.6 02/10/2016   MCV 89.4 02/10/2016   PLT 224 02/10/2016       Chemistry      Component Value Date/Time   NA 140 02/10/2016 0852   NA 140 01/12/2016 1115   K 3.9 02/10/2016 0852   K 4.0 01/12/2016 1115   CL 107 01/12/2016 1115   CO2 24 02/10/2016 0852   CO2 22 01/12/2016 1115   BUN 6.6* 02/10/2016 0852   BUN 7 01/12/2016 1115   CREATININE 0.8 02/10/2016 0852   CREATININE 0.75 01/12/2016 1115   CREATININE 0.80 05/19/2012 1817      Component Value Date/Time   CALCIUM 9.1 02/10/2016 0852   CALCIUM 8.8 01/12/2016 1115   ALKPHOS 51 02/10/2016 0852   ALKPHOS 47 01/12/2016 1115   AST 11 02/10/2016 0852   AST 12 01/12/2016 1115   ALT <9 02/10/2016 0852   ALT 8 01/12/2016 1115   BILITOT <0.30 02/10/2016 0852   BILITOT 0.4 01/12/2016 1115       No results found for: LABCA2  No components found for: QBHAL937  No results for input(s): INR in the last 168 hours.  Urinalysis    Component Value Date/Time   LABSPEC 1.020 01/12/2016 1122   PHURINE 5.5 01/12/2016 1122   GLUCOSEU NEGATIVE 01/12/2016 1122   HGBUR TRACE* 01/12/2016 1122   BILIRUBINUR NEGATIVE 01/12/2016 1122   KETONESUR NEGATIVE 01/12/2016 1122   PROTEINUR NEGATIVE 01/12/2016 1122   UROBILINOGEN 0.2 01/12/2016 1122   NITRITE NEGATIVE 01/12/2016 1122   LEUKOCYTESUR NEGATIVE 01/12/2016 1122      ELIGIBLE FOR AVAILABLE RESEARCH PROTOCOL: PREVENT?  STUDIES: Mm Digital Diagnostic Unilat R  01/28/2016  CLINICAL DATA:  Post biopsy mammogram of the right breast for clip placement. EXAM: DIAGNOSTIC RIGHT MAMMOGRAM POST ULTRASOUND BIOPSY COMPARISON:  Previous exam(s). FINDINGS: Mammographic images were obtained following ultrasound guided biopsy of mass in the right breast at 1130, and a lymph node in the right axilla. A ribbon shaped biopsy marking clip is appropriately positioned within the mass in the right breast at 1130. A spring shaped biopsy marking clip is visualized within a lymph node seen only on the MLO view in the right axilla. IMPRESSION: 1. Appropriate positioning of  the ribbon shaped biopsy marking clip within the right breast mass at 1130. 2. A spring shaped biopsy marking clip is visualized on a single view in the right axilla. Final Assessment: Post Procedure Mammograms for Marker Placement Electronically Signed   By: [name] Ferrier M.D.  On: 01/28/2016 14:47   [place] Rt Breast Bx W Loc Dev 1st Lesion Img Bx Spec US Guide  01/29/2016  ADDENDUM REPORT: 01/29/2016 12:55 ADDENDUM: Pathology revealed grade III invasive ductal carcinoma in the right breast and a benign right axillary lymph node. This was found to be concordant by Dr. Dorise Bullion. Pathology results were discussed with the patient by telephone. The patient reported doing well after the biopsy with tenderness at the sites. Post biopsy instructions and care were reviewed and questions were answered. The patient was encouraged to call [facility] for any additional concerns. The patient was referred to the [facility] at the [facility] on February 10, 2016. Pathology results reported by Susa Raring RN, BSN on 01/29/2016. Electronically Signed   By: Dorise Bullion III M.D   On: 01/29/2016 12:55  01/29/2016  CLINICAL DATA:  Right breast mass EXAM: ULTRASOUND GUIDED RIGHT BREAST CORE NEEDLE BIOPSY COMPARISON:  Previous exam(s). FINDINGS: I met with the patient and we discussed the procedure of ultrasound-guided biopsy, including benefits and alternatives. We discussed the high likelihood of a successful procedure. We discussed the risks of the procedure, including infection, bleeding, tissue injury, clip migration, and inadequate sampling. Informed written consent was given. The usual time-out protocol was performed immediately prior to the procedure. Using sterile technique and 1% Lidocaine as local anesthetic, under direct ultrasound visualization, a 14 gauge spring-loaded device was used to perform biopsy of a right breast mass using a  lateral approach. At the conclusion of the procedure a ribbon shaped tissue marker clip was deployed into the biopsy cavity. Follow up 2 view mammogram was performed and dictated separately. IMPRESSION: Ultrasound guided biopsy of the right breast mass. No apparent complications. Electronically Signed: By: Dorise Bullion III M.D On: 01/28/2016 14:57   [place] Rt Breast Bx W Loc Dev Ea Add Lesion Img Bx Spec US Guide  01/29/2016  ADDENDUM REPORT: 01/29/2016 12:55 ADDENDUM: Pathology revealed grade III invasive ductal carcinoma in the right breast and a benign right axillary lymph node. This was found to be concordant by Dr. Dorise Bullion. Pathology results were discussed with the patient by telephone. The patient reported doing well after the biopsy with tenderness at the sites. Post biopsy instructions and care were reviewed and questions were answered. The patient was encouraged to call [facility] for any additional concerns. The patient was referred to the [facility] at the [facility] on February 10, 2016. Pathology results reported by Susa Raring RN, BSN on 01/29/2016. Electronically Signed   By: Dorise Bullion III M.D   On: 01/29/2016 12:55  01/29/2016  CLINICAL DATA:  Right axillary lymph node with thickened cortex. EXAM: ULTRASOUND GUIDED CORE NEEDLE BIOPSY OF A right AXILLARY NODE COMPARISON:  Previous exam(s). FINDINGS: I met with the patient and we discussed the procedure of ultrasound-guided biopsy, including benefits and alternatives. We discussed the high likelihood of a successful procedure. We discussed the risks of the procedure, including infection, bleeding, tissue injury, clip migration, and inadequate sampling. Informed written consent was given. The usual time-out protocol was performed immediately prior to the procedure. Using sterile technique and 1% Lidocaine as local anesthetic, under direct ultrasound visualization,  a 14 gauge spring-loaded device was used to perform biopsy of a right axillary lymph node using a lateral approach. At the conclusion of the procedure a HydroMARK tissue clip was deployed into the biopsy  cavity. Follow up 2 view mammogram was performed and dictated separately. IMPRESSION: Ultrasound guided biopsy of a right axillary. No apparent complications. Electronically Signed: By: Dorise Bullion III M.D On: 01/28/2016 14:58    ASSESSMENT: 40 y.o. [place] woman status post right breast biopsy 01/28/2016 for a clinical T2 N0 invasive ductal carcinoma, grade 3, triple negative, with an MIB-1 of 70%.  (a) suspicious right axillary lymph node biopsied 01/28/2016 was benign  (1) neoadjuvant chemotherapy will consist of doxorubicin and cyclophosphamide in dose dense fashion 4 followed by paclitaxel and carboplatin weekly 12  (2) genetics testing is pending  (3) definitive surgery to follow  (5) adjuvant radiation to follow as appropriate  PLAN: I discussed with Rebecca [name] and her mother the implications of her being pregnant. Chemotherapy is not done in the first trimester, because it is likely to cause major problems to the fetus. It can be done in the second or third trimester with minimal concern regarding fetal damage. This means Olimpia's systemic treatment would have to be delayed by at least 3 months.  Although it is difficult to obtain hard data, it is projected that a 3 month delay in chemotherapy may increase the risk of systemic spread by between 5 and 10%. We could shorten her that somewhat if she could have lumpectomy under local anesthesia for example. Nevertheless there is no question that keeping the baby would have a negative impact on her breast cancer treatment plan.  Rebecca [name] was not entirely sure how she wanted to proceed but was strongly leaning to not keeping the baby. She will be meeting with Dr Harolyn Rutherford later today and she thinks she will have a decision made by that time.  If  she does decide on an abortion, then we can proceed with plans as previously, with port placement, and starting chemotherapy 03/08/2016. Otherwise she will let us know and we will change the plan accordingly     Rebecca Cruel, MD   02/26/2016 8:23 PM Medical Oncology and Hematology [facility] [address] [place], [place] [address] Tel. [phone]    Fax. [phone]   Thank you

## 2016-02-26 NOTE — Progress Notes (Signed)
Quick Note:  HCG is rising appropriately as expected. Pregnancy would usually not be visualized on ultrasound until HCG level is around 1500. Since patient desires termination; this would not be able to be performed until an intrauterine pregnancy is visualized (to rule out ectopic or other abnormal pregnancy). Recommended ultrasound in about 7-10 days, the level should be above 1500 at this point. Once IUP is visualized, patient can make plans about termination of pregnancy. With regard to her breast cancer, I will defer decisions about time of initiation of chemotherapy to her oncologist. Of note, this note was routed to Dr. [name], patient's oncologist. Please call to inform patient of results and recommendations.   Verita Schneiders, MD, [place] Attending Obstetrician & Gynecologist, [place] for [facility]   ______

## 2016-02-28 ENCOUNTER — Other Ambulatory Visit: Payer: Self-pay | Admitting: Oncology

## 2016-02-28 NOTE — Progress Notes (Signed)
Patient ID: Rebecca [name], female   DOB: 08-29-1976, 40 y.o.   MRN: [id number] Oncology: Dr. Jana [name]  40 yo with recent breast cancer diagnosis presents for cardio-oncology evaluation.  Her tumor is ER-/PR-/HER2-.  She will be starting doxorubicin/Cytoxan chemotherapy then paclitaxel + carboplatin. This will be followed by surgery.  Echo was done today and reviewed: EF and strain parameters normal.    Of note, she is pregnant.  She will be discussing future plans with her OB/gyn.    No history of heart problems.  No exertional dyspnea or chest pain.  No palpitations.   PMH: 1. Breast cancer: ER-/PR-/HER2-.  She will be starting doxorubicin/Cytoxan chemotherapy then paclitaxel + carboplatin.  This will be followed by surgery.   - Echo (3/17) with EF 55%, GLS -20.8%, lateral s' 10.3 cm/sec, normal RV size and systolic function.   Social History   Social History  . Marital Status: Single    Spouse Name: N/A  . Number of Children: N/A  . Years of Education: N/A   Occupational History  . Not on file.   Social History Main Topics  . Smoking status: Current Every Day Smoker -- 0.25 packs/day for 20 years    Types: Cigarettes  . Smokeless tobacco: Never Used  . Alcohol Use: Yes     Comment: occ  . Drug Use: No  . Sexual Activity: Not on file   Other Topics Concern  . Not on file   Social History Narrative   Family History  Problem Relation Age of Onset  . Hypertension Mother   . Uterine cancer Mother   . Cancer Father   . Hypertension Father   . Breast cancer Maternal Aunt   . Prostate cancer Maternal Uncle   . Breast cancer Maternal Grandmother   . Throat cancer Maternal Grandfather   . Breast cancer Cousin    ROS: All systems reviewed and negative except as per HPI  Current Outpatient Prescriptions  Medication Sig Dispense Refill  . LORazepam (ATIVAN) 0.5 MG tablet Take 1 tablet (0.5 mg total) by mouth at bedtime. 30 tablet 0  . OVER THE COUNTER MEDICATION Take 1 Dose by  mouth every other day. Herbal over the counter liquid detox    . dexamethasone (DECADRON) 4 MG tablet Take 2 tablets by mouth once a day on the day after chemotherapy and then take 2 tablets two times a day for 2 days. Take with food. (Patient not taking: Reported on 02/16/2016) 30 tablet 1  . lidocaine-prilocaine (EMLA) cream Apply to affected area once (Patient not taking: Reported on 02/16/2016) 30 g 3  . prochlorperazine (COMPAZINE) 10 MG tablet Take 1 tablet (10 mg total) by mouth every 6 (six) hours as needed (Nausea or vomiting). (Patient not taking: Reported on 02/16/2016) 30 tablet 1   No current facility-administered medications for this encounter.   BP 108/66 mmHg  Pulse 77  Wt 189 lb (85.73 kg)  SpO2 100%  LMP 01/19/2016 (Exact Date) General: NAD Neck: No JVD, no thyromegaly or thyroid nodule.  Lungs: Clear to auscultation bilaterally with normal respiratory effort. CV: Nondisplaced PMI.  Heart regular S1/S2, no S3/S4, no murmur.  No peripheral edema.  No carotid bruit.  Normal pedal pulses.  Abdomen: Soft, nontender, no hepatosplenomegaly, no distention.  Skin: Intact without lesions or rashes.  Neurologic: Alert and oriented x 3.  Psych: Normal affect. Extremities: No clubbing or cyanosis.  HEENT: Normal.   Assessment/Plan: 40 yo with new diagnosis of triple negative breast cancer.  She will be starting doxorubicin-containing chemotherapy soon. Baseline echo was reviewed today. It showed normal EF and strain pattern.  I will have him get another echo in 3 months then 9 months.   I recommended that she quit smoking.  Rebecca [name] 02/28/2016

## 2016-02-29 ENCOUNTER — Other Ambulatory Visit: Payer: Medicaid Other

## 2016-02-29 ENCOUNTER — Ambulatory Visit: Payer: Medicaid Other

## 2016-02-29 ENCOUNTER — Ambulatory Visit: Payer: Medicaid Other | Admitting: Obstetrics & Gynecology

## 2016-03-02 ENCOUNTER — Ambulatory Visit (HOSPITAL_BASED_OUTPATIENT_CLINIC_OR_DEPARTMENT_OTHER): Payer: Medicaid Other | Admitting: Nurse Practitioner

## 2016-03-02 ENCOUNTER — Telehealth: Payer: Self-pay | Admitting: Nurse Practitioner

## 2016-03-02 ENCOUNTER — Encounter: Payer: Self-pay | Admitting: Nurse Practitioner

## 2016-03-02 ENCOUNTER — Encounter: Payer: Self-pay | Admitting: *Deleted

## 2016-03-02 ENCOUNTER — Other Ambulatory Visit: Payer: Medicaid Other

## 2016-03-02 VITALS — BP 107/69 | HR 109 | Temp 97.8°F | Resp 18 | Ht 61.5 in | Wt 188.6 lb

## 2016-03-02 DIAGNOSIS — Z3490 Encounter for supervision of normal pregnancy, unspecified, unspecified trimester: Secondary | ICD-10-CM

## 2016-03-02 DIAGNOSIS — C50411 Malignant neoplasm of upper-outer quadrant of right female breast: Secondary | ICD-10-CM

## 2016-03-02 MED ORDER — ONDANSETRON HCL 8 MG PO TABS: 8.0000 mg | ORAL_TABLET | Freq: Two times a day (BID) | ORAL | Status: DC | PRN

## 2016-03-02 NOTE — Progress Notes (Signed)
[facility]  [phone] [phone]     ID: Rebecca [name] DOB: March 14, 1976  MR#: [id number]  XUS#:[id number]  Patient Care Team: Massie Maroon, FNP as PCP - General (Family Medicine) [name], MD as Consulting Physician (General Surgery) Lowella Dell, MD as Consulting Physician (Oncology) Antony Blackbird, MD as Consulting Physician (Radiation Oncology) Salomon Fick, NP as Nurse Practitioner (Hematology and Oncology) PCP: Massie Maroon, FNP GYN: OTHER MD:  CHIEF COMPLAINT: triple negative breast cancer  CURRENT TREATMENT: neoadjuvant chemotherapy pending  BREAST CANCER HISTORY: From the original intake note:  Rebecca [name] herself noted a change in her right breast sometime around September or October 2016. She did not bring it to intermediate medical attention, but on 01/13/2016 she established herself in Dr. Hart Rochester' service and she was set up for bilateral diagnostic mammography with tomosynthesis and [facility]. The breast density was category B. In the upper outer quadrant of the right breast there was a spiculated mass measuring 2.8 cm. On physical exam this was palpable. Targeted ultrasonography confirmed an irregular hypoechoic mass in the right breast 11:30 o'clock position measuring 2.6 cm maximally. Ultrasound of the right axilla showed a morphologically abnormal lymph node.  In the left breast there were some tubular densities behind the areola which by ultrasonography showed benign ductal ectasia.  On 01/28/2016 [place] underwent biopsy of the right breast mass and abnormal right axillary lymph node. The pathology from this procedure (S AAA [id number]) showed the lymph node to be benign. In the breast however there was an invasive ductal carcinoma, grade 3, which was estrogen and progesterone receptor negative. The proliferation marker was 70%. HER-2 was not amplified with a signals ratio of  1.32. The number per cell was 2.05.  The patient's subsequent history is as detailed below  INTERVAL HISTORY: Rebecca [name] returns today for further discussion of her breast cancer treatment, accompanied by her mother. Since her last visit here she has met with Dr. Macon Large and has decided to move forward with terminating her pregnancy. She was not yet far enough along to do so, however. Because of this, neoadjuvant therapy remains pending.  REVIEW OF SYSTEMS: Rebecca [name] has some pain in her back and also associated with her prior breast biopsy. She has mild sinus symptoms. She has a little bit of a cough. Her appetite is poor. She continues to have stress urinary incontinence issues. She has some eczema. She feels weak, anxious, and depressed. A detailed review of systems was otherwise stable  PAST MEDICAL HISTORY: Past Medical History  Diagnosis Date  . Breast cancer (HCC)   . Depression   . Anxiety   . Sickle cell trait (HCC)   . Obesity (BMI 35.0-39.9 without comorbidity) (HCC)   . Hypertension     pt states is currently on no medications   . Seasonal allergies   . GERD (gastroesophageal reflux disease)     PAST SURGICAL HISTORY: Past Surgical History  Procedure Laterality Date  . Cesarean section      2004 and 2007    FAMILY HISTORY Family History  Problem Relation Age of Onset  . Hypertension Mother   . Uterine cancer Mother   . Cancer Father   . Hypertension Father   . Breast cancer Maternal Aunt   . Prostate cancer Maternal Uncle   . Breast cancer Maternal Grandmother   . Throat cancer Maternal Grandfather   . Breast cancer Cousin   The patient has very little information  about her father. Her mother is currently 55 years old. She had a history of cervical cancer at age 57. The patient had 2 brothers, no sisters. The maternal grandfather had throat cancer. A maternal uncle was diagnosed with breast cancer as well as prostate cancer at the age of 30. 2 maternal cousins, one of  the mail, had breast cancer as well.  GYNECOLOGIC HISTORY:  Patient's last menstrual period was 01/19/2016 (exact date). Menarche age 28, first live birth age 75. The patient is GX P4. She still having regular periods. She took oral contraceptives in the 1990s with no side effects.  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 The patient's significant other [name] works at [employer]. In addition to them the patient's 3 children Chasmine [place], [place] and [name] are also in the home. There are age 29, 67, and 32. The patient's son [name], currently 71 years old, lives in [place].    ADVANCED DIRECTIVES: Not in place   HEALTH MAINTENANCE: Social History  Substance Use Topics  . Smoking status: Current Every Day Smoker -- 0.25 packs/day for 20 years    Types: Cigarettes  . Smokeless tobacco: Never Used  . Alcohol Use: Yes     Comment: occ     Colonoscopy:  PAP:  Bone density:  Lipid panel:  No Known Allergies  Current Outpatient Prescriptions  Medication Sig Dispense Refill  . dexamethasone (DECADRON) 4 MG tablet Take 2 tablets by mouth once a day on the day after chemotherapy and then take 2 tablets two times a day for 2 days. Take with food. (Patient not taking: Reported on 02/16/2016) 30 tablet 1  . lidocaine-prilocaine (EMLA) cream Apply to affected area once (Patient not taking: Reported on 02/16/2016) 30 g 3  . LORazepam (ATIVAN) 0.5 MG tablet Take 1 tablet (0.5 mg total) by mouth at bedtime. (Patient not taking: Reported on 03/02/2016) 30 tablet 0  . ondansetron (ZOFRAN) 8 MG tablet Take 1 tablet (8 mg total) by mouth 2 (two) times daily as needed. Start on the third day after chemotherapy. 30 tablet 1  . OVER THE COUNTER MEDICATION Take 1 Dose by mouth every other day. Reported on 03/02/2016    . prochlorperazine (COMPAZINE) 10 MG tablet Take 1 tablet (10 mg total) by mouth every  6 (six) hours as needed (Nausea or vomiting). (Patient not taking: Reported on 02/16/2016) 30 tablet 1   No current facility-administered medications for this visit.    OBJECTIVE: [name] Who appears stated age 68 Vitals:   03/02/16 0924  BP: 107/69  Pulse: 109  Temp: 97.8 F (36.6 C)  Resp: 18     Body mass index is 35.06 kg/(m^2).    ECOG FS:1 - Symptomatic but completely ambulatory  Skin: warm, dry  HEENT: sclerae anicteric, conjunctivae pink, oropharynx clear. No thrush or mucositis.  Lymph Nodes: No cervical or supraclavicular lymphadenopathy  Lungs: clear to auscultation bilaterally, no rales, wheezes, or rhonci  Heart: regular rate and rhythm  Abdomen: round, soft, non tender, positive bowel sounds  Musculoskeletal: No focal spinal tenderness, no peripheral edema  Neuro: non focal, well oriented, positive affect  Breasts: deferred   LAB RESULTS:  CMP     Component Value Date/Time   NA 140 02/10/2016 0852   NA 140 01/12/2016 1115   K 3.9 02/10/2016 0852   K 4.0 01/12/2016 1115  CL 107 01/12/2016 1115   CO2 24 02/10/2016 0852   CO2 22 01/12/2016 1115   GLUCOSE 91 02/10/2016 0852   GLUCOSE 69 01/12/2016 1115   BUN 6.6* 02/10/2016 0852   BUN 7 01/12/2016 1115   CREATININE 0.8 02/10/2016 0852   CREATININE 0.75 01/12/2016 1115   CREATININE 0.80 05/19/2012 1817   CALCIUM 9.1 02/10/2016 0852   CALCIUM 8.8 01/12/2016 1115   PROT 7.4 02/10/2016 0852   PROT 7.1 01/12/2016 1115   ALBUMIN 3.8 02/10/2016 0852   ALBUMIN 4.2 01/12/2016 1115   AST 11 02/10/2016 0852   AST 12 01/12/2016 1115   ALT <9 02/10/2016 0852   ALT 8 01/12/2016 1115   ALKPHOS 51 02/10/2016 0852   ALKPHOS 47 01/12/2016 1115   BILITOT <0.30 02/10/2016 0852   BILITOT 0.4 01/12/2016 1115   GFRNONAA >89 01/12/2016 1115   GFRAA >89 01/12/2016 1115    INo results found for: SPEP, UPEP  Lab Results  Component Value Date   WBC 11.1* 02/10/2016   NEUTROABS 7.5* 02/10/2016     HGB 12.2 02/10/2016   HCT 35.6 02/10/2016   MCV 89.4 02/10/2016   PLT 224 02/10/2016      Chemistry      Component Value Date/Time   NA 140 02/10/2016 0852   NA 140 01/12/2016 1115   K 3.9 02/10/2016 0852   K 4.0 01/12/2016 1115   CL 107 01/12/2016 1115   CO2 24 02/10/2016 0852   CO2 22 01/12/2016 1115   BUN 6.6* 02/10/2016 0852   BUN 7 01/12/2016 1115   CREATININE 0.8 02/10/2016 0852   CREATININE 0.75 01/12/2016 1115   CREATININE 0.80 05/19/2012 1817      Component Value Date/Time   CALCIUM 9.1 02/10/2016 0852   CALCIUM 8.8 01/12/2016 1115   ALKPHOS 51 02/10/2016 0852   ALKPHOS 47 01/12/2016 1115   AST 11 02/10/2016 0852   AST 12 01/12/2016 1115   ALT <9 02/10/2016 0852   ALT 8 01/12/2016 1115   BILITOT <0.30 02/10/2016 0852   BILITOT 0.4 01/12/2016 1115       No results found for: LABCA2  No components found for: NOBSJ628  No results for input(s): INR in the last 168 hours.  Urinalysis    Component Value Date/Time   LABSPEC 1.020 01/12/2016 1122   PHURINE 5.5 01/12/2016 1122   GLUCOSEU NEGATIVE 01/12/2016 1122   HGBUR TRACE* 01/12/2016 1122   BILIRUBINUR NEGATIVE 01/12/2016 1122   KETONESUR NEGATIVE 01/12/2016 1122   PROTEINUR NEGATIVE 01/12/2016 1122   UROBILINOGEN 0.2 01/12/2016 1122   NITRITE NEGATIVE 01/12/2016 1122   LEUKOCYTESUR NEGATIVE 01/12/2016 1122    ELIGIBLE FOR AVAILABLE RESEARCH PROTOCOL: PREVENT?  STUDIES: No results found.  ASSESSMENT: 40 y.o. Rebecca [name] woman status post right breast biopsy 01/28/2016 for a clinical T2 N0 invasive ductal carcinoma, grade 3, triple negative, with an MIB-1 of 70%.  (a) suspicious right axillary lymph node biopsied 01/28/2016 was benign  (1) neoadjuvant chemotherapy will consist of doxorubicin and cyclophosphamide in dose dense fashion 4 followed by paclitaxel and carboplatin weekly 12  (2) genetics testing is pending  (3) definitive surgery to follow  (5) adjuvant radiation to follow as  appropriate  PLAN: Rebecca [name] has an appointment with Dr. Harolyn Rutherford this upcoming Monday to have an ultrasound and possibly make arrangements for termination of her pregnancy. I do not know the timeline for such an event, as her hcg has to be above a certain level for the embryo to be located.  Assuming this occurs sooner rather than later, I have made arrangements for a port to be placed by interventional radiology next week. She has already had a baseline echocardiogram performed.  She will return next Thursday for an hour long appointment to discuss her antiemetic schedule. We would have done so today, but this appointment slot was only 30 minutes long, and she is due in chemotherapy class at 10am this morning.   Rebecca [name] is scheduled to begin chemotherapy on 4/11 if all things work smoothly. If not, we will continue to move the start of chemotherapy out one week at a time until dates become more firm. She understands and agree with this plan. She knows the goal of treatment in her case is cure. She has been encouraged to call with any issues that might arise before her next visit here.   Laurie Panda, NP   03/02/2016 9:37 AM

## 2016-03-02 NOTE — Telephone Encounter (Signed)
appt made and as printed. IR to return call to sch port place appt.

## 2016-03-07 ENCOUNTER — Telehealth: Payer: Self-pay

## 2016-03-07 ENCOUNTER — Ambulatory Visit (HOSPITAL_COMMUNITY)
Admission: RE | Admit: 2016-03-07 | Discharge: 2016-03-07 | Disposition: A | Discharge status: Home / Self Care | Payer: Medicaid Other | Source: Ambulatory Visit | Attending: Obstetrics & Gynecology | Admitting: Obstetrics & Gynecology

## 2016-03-07 DIAGNOSIS — Z36 Encounter for antenatal screening of mother: Secondary | ICD-10-CM | POA: Insufficient documentation

## 2016-03-07 DIAGNOSIS — O09521 Supervision of elderly multigravida, first trimester: Secondary | ICD-10-CM | POA: Diagnosis not present

## 2016-03-07 DIAGNOSIS — Z3A01 Less than 8 weeks gestation of pregnancy: Secondary | ICD-10-CM | POA: Insufficient documentation

## 2016-03-07 DIAGNOSIS — O3680X0 Pregnancy with inconclusive fetal viability, not applicable or unspecified: Secondary | ICD-10-CM

## 2016-03-07 DIAGNOSIS — Z3490 Encounter for supervision of normal pregnancy, unspecified, unspecified trimester: Secondary | ICD-10-CM

## 2016-03-07 NOTE — Telephone Encounter (Signed)
Spoke with patient she has been informed of pregnancy and she is requesting information of abortions clinic in the area. Information has been given.

## 2016-03-07 NOTE — Telephone Encounter (Signed)
Per Dr. Harolyn Rutherford, pt needs to be informed that she has an intrauterine pregnancy, if she desires termination she can make her plans.  If she decides to keep the pregnancy schedule f/u US in two weeks for viability.  Called pt unable to LM due to phone kept ringing.

## 2016-03-08 ENCOUNTER — Ambulatory Visit: Payer: Medicaid Other

## 2016-03-08 ENCOUNTER — Other Ambulatory Visit: Payer: Medicaid Other

## 2016-03-10 ENCOUNTER — Encounter: Payer: Self-pay | Admitting: Nurse Practitioner

## 2016-03-10 ENCOUNTER — Ambulatory Visit (HOSPITAL_BASED_OUTPATIENT_CLINIC_OR_DEPARTMENT_OTHER): Payer: Medicaid Other | Admitting: Nurse Practitioner

## 2016-03-10 ENCOUNTER — Telehealth: Payer: Self-pay | Admitting: Nurse Practitioner

## 2016-03-10 VITALS — BP 104/66 | HR 89 | Temp 98.3°F | Resp 16 | Wt 188.5 lb

## 2016-03-10 DIAGNOSIS — N393 Stress incontinence (female) (male): Secondary | ICD-10-CM

## 2016-03-10 DIAGNOSIS — Z720 Tobacco use: Secondary | ICD-10-CM

## 2016-03-10 DIAGNOSIS — C50411 Malignant neoplasm of upper-outer quadrant of right female breast: Secondary | ICD-10-CM

## 2016-03-10 DIAGNOSIS — M545 Low back pain: Secondary | ICD-10-CM

## 2016-03-10 DIAGNOSIS — Z3490 Encounter for supervision of normal pregnancy, unspecified, unspecified trimester: Secondary | ICD-10-CM

## 2016-03-10 NOTE — Progress Notes (Addendum)
[place]  [phone] [phone]     ID: Rebecca [name] DOB: 08-Dec-1975  MR#: [id number]  CXK#:[id number]  Patient Care Team: Dorena Dew, FNP as PCP - General (Family Medicine) Alphonsa Overall, MD as Consulting Physician (General Surgery) Chauncey Cruel, MD as Consulting Physician (Oncology) Gery Pray, MD as Consulting Physician (Radiation Oncology) Sylvan Cheese, NP as Nurse Practitioner (Hematology and Oncology) PCP: Dorena Dew, FNP GYN: OTHER MD:  CHIEF COMPLAINT: triple negative breast cancer  CURRENT TREATMENT: neoadjuvant chemotherapy pending  BREAST CANCER HISTORY: From the original intake note:  Mikyla herself noted a change in her right breast sometime around September or October 2016. She did not bring it to intermediate medical attention, but on 01/13/2016 she established herself in Dr. Smith Robert' service and she was set up for bilateral diagnostic mammography with tomosynthesis and bilateral ultrasonography at the [facility] 01/19/2016. The breast density was category B. In the upper outer quadrant of the right breast there was a spiculated mass measuring 2.8 cm. On physical exam this was palpable. Targeted ultrasonography confirmed an irregular hypoechoic mass in the right breast 11:30 o'clock position measuring 2.6 cm maximally. Ultrasound of the right axilla showed a morphologically abnormal lymph node.  In the left breast there were some tubular densities behind the areola which by ultrasonography showed benign ductal ectasia.  On 01/28/2016 [place] underwent biopsy of the right breast mass and abnormal right axillary lymph node. The pathology from this procedure ([facility] [id number]) showed the lymph node to be benign. In the breast however there was an invasive ductal carcinoma, grade 3, which was estrogen and progesterone receptor negative. The proliferation marker was 70%. HER-2 was not amplified with a signals ratio of  1.32. The number per cell was 2.05.  The patient's subsequent history is as detailed below  INTERVAL HISTORY: Inice returns today for further discussion of her breast cancer treatment, accompanied by her mother. This Monday she visited with her OB, Dr. Harolyn Rutherford, and had a transvaginal ultrasound performed. No embryo was able to be detected, possibly because she is so early in her pregnancy, but a single intrauterine gestational sac was measured at approximately 76 weeks old. She was told by Dr. Harolyn Rutherford that if she still desired to terminate the pregnancy she could go ahead an makes plans to do so. The patient was referred to [facility] [place], but has not yet called to make an appointment, though she plans to. Neoadjuvant therapy remains pending until this is completed.  REVIEW OF SYSTEMS: Rettie has some pain in her back and also associated with her prior breast biopsy. She has mild sinus symptoms. She has a little bit of a cough. Her appetite is poor. She continues to have stress urinary incontinence issues. She has some eczema. She feels weak, anxious, and depressed. A detailed review of systems was otherwise stable  PAST MEDICAL HISTORY: Past Medical History  Diagnosis Date  . Breast cancer ([place])   . Depression   . Anxiety   . Sickle cell trait ([place])   . Obesity (BMI 35.0-39.9 without comorbidity) ([place])   . Hypertension     pt states is currently on no medications   . Seasonal allergies   . GERD (gastroesophageal reflux disease)     PAST SURGICAL HISTORY: Past Surgical History  Procedure Laterality Date  . Cesarean section      2004 and 2007    FAMILY HISTORY Family History  Problem Relation Age of Onset  .  Hypertension Mother   . Uterine cancer Mother   . Cancer Father   . Hypertension Father   . Breast cancer Maternal Aunt   . Prostate cancer Maternal Uncle   . Breast cancer Maternal Grandmother   . Throat cancer Maternal Grandfather   . Breast cancer Cousin     The patient has very little information about her father. Her mother is currently 42 years old. She had a history of cervical cancer at age 42. The patient had 2 brothers, no sisters. The maternal grandfather had throat cancer. A maternal uncle was diagnosed with breast cancer as well as prostate cancer at the age of 68. 2 maternal cousins, one of the mail, had breast cancer as well.  GYNECOLOGIC HISTORY:  Patient's last menstrual period was 01/19/2016 (exact date). Menarche age 17, first live birth age 44. The patient is GX P4. She still having regular periods. She took oral contraceptives in the 1990s with no side effects.  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 The patient's significant other [name] works at [employer]. In addition to them the patient's 3 children Chasmine [place], Perris and [name] are also in the home. There are age 42, 42, and 42. The patient's son [name], currently 57 years old, lives in [place].    ADVANCED DIRECTIVES: Not in place   HEALTH MAINTENANCE: Social History  Substance Use Topics  . Smoking status: Current Every Day Smoker -- 0.25 packs/day for 20 years    Types: Cigarettes  . Smokeless tobacco: Never Used  . Alcohol Use: Yes     Comment: occ     Colonoscopy:  PAP:  Bone density:  Lipid panel:  No Known Allergies  Current Outpatient Prescriptions  Medication Sig Dispense Refill  . dexamethasone (DECADRON) 4 MG tablet Take 2 tablets by mouth once a day on the day after chemotherapy and then take 2 tablets two times a day for 2 days. Take with food. (Patient not taking: Reported on 02/16/2016) 30 tablet 1  . lidocaine-prilocaine (EMLA) cream Apply to affected area once (Patient not taking: Reported on 02/16/2016) 30 g 3  . LORazepam (ATIVAN) 0.5 MG tablet Take 1 tablet (0.5 mg total) by mouth at bedtime. (Patient not taking: Reported on 03/02/2016)  30 tablet 0  . ondansetron (ZOFRAN) 8 MG tablet Take 1 tablet (8 mg total) by mouth 2 (two) times daily as needed. Start on the third day after chemotherapy. (Patient not taking: Reported on 03/10/2016) 30 tablet 1  . OVER THE COUNTER MEDICATION Take 1 Dose by mouth every other day. Reported on 03/10/2016    . prochlorperazine (COMPAZINE) 10 MG tablet Take 1 tablet (10 mg total) by mouth every 6 (six) hours as needed (Nausea or vomiting). (Patient not taking: Reported on 02/16/2016) 30 tablet 1   No current facility-administered medications for this visit.    OBJECTIVE: [name] Who appears stated age 44 Vitals:   03/10/16 1144  BP: 104/66  Pulse: 89  Temp: 98.3 F (36.8 C)  Resp: 16     Body mass index is 35.04 kg/(m^2).    ECOG FS:1 - Symptomatic but completely ambulatory  Skin: warm, dry  HEENT: sclerae anicteric, conjunctivae pink, oropharynx clear. No thrush or mucositis.  Lymph Nodes: No cervical or supraclavicular lymphadenopathy  Lungs: clear to auscultation bilaterally, no rales, wheezes, or rhonci  Heart: regular rate and rhythm  Abdomen: round, soft, non tender, positive bowel sounds  Musculoskeletal: No focal spinal tenderness, no peripheral edema  Neuro: non focal, well oriented, positive affect  Breasts: deferred   LAB RESULTS:  CMP     Component Value Date/Time   NA 140 02/10/2016 0852   NA 140 01/12/2016 1115   K 3.9 02/10/2016 0852   K 4.0 01/12/2016 1115   CL 107 01/12/2016 1115   CO2 24 02/10/2016 0852   CO2 22 01/12/2016 1115   GLUCOSE 91 02/10/2016 0852   GLUCOSE 69 01/12/2016 1115   BUN 6.6* 02/10/2016 0852   BUN 7 01/12/2016 1115   CREATININE 0.8 02/10/2016 0852   CREATININE 0.75 01/12/2016 1115   CREATININE 0.80 05/19/2012 1817   CALCIUM 9.1 02/10/2016 0852   CALCIUM 8.8 01/12/2016 1115   PROT 7.4 02/10/2016 0852   PROT 7.1 01/12/2016 1115   ALBUMIN 3.8 02/10/2016 0852   ALBUMIN 4.2 01/12/2016 1115   AST 11 02/10/2016  0852   AST 12 01/12/2016 1115   ALT <9 02/10/2016 0852   ALT 8 01/12/2016 1115   ALKPHOS 51 02/10/2016 0852   ALKPHOS 47 01/12/2016 1115   BILITOT <0.30 02/10/2016 0852   BILITOT 0.4 01/12/2016 1115   GFRNONAA >89 01/12/2016 1115   GFRAA >89 01/12/2016 1115    INo results found for: SPEP, UPEP  Lab Results  Component Value Date   WBC 11.1* 02/10/2016   NEUTROABS 7.5* 02/10/2016   HGB 12.2 02/10/2016   HCT 35.6 02/10/2016   MCV 89.4 02/10/2016   PLT 224 02/10/2016      Chemistry      Component Value Date/Time   NA 140 02/10/2016 0852   NA 140 01/12/2016 1115   K 3.9 02/10/2016 0852   K 4.0 01/12/2016 1115   CL 107 01/12/2016 1115   CO2 24 02/10/2016 0852   CO2 22 01/12/2016 1115   BUN 6.6* 02/10/2016 0852   BUN 7 01/12/2016 1115   CREATININE 0.8 02/10/2016 0852   CREATININE 0.75 01/12/2016 1115   CREATININE 0.80 05/19/2012 1817      Component Value Date/Time   CALCIUM 9.1 02/10/2016 0852   CALCIUM 8.8 01/12/2016 1115   ALKPHOS 51 02/10/2016 0852   ALKPHOS 47 01/12/2016 1115   AST 11 02/10/2016 0852   AST 12 01/12/2016 1115   ALT <9 02/10/2016 0852   ALT 8 01/12/2016 1115   BILITOT <0.30 02/10/2016 0852   BILITOT 0.4 01/12/2016 1115       No results found for: LABCA2  No components found for: AVWUJ811  No results for input(s): INR in the last 168 hours.  Urinalysis    Component Value Date/Time   LABSPEC 1.020 01/12/2016 1122   PHURINE 5.5 01/12/2016 1122   GLUCOSEU NEGATIVE 01/12/2016 1122   HGBUR TRACE* 01/12/2016 1122   BILIRUBINUR NEGATIVE 01/12/2016 1122   KETONESUR NEGATIVE 01/12/2016 1122   PROTEINUR NEGATIVE 01/12/2016 1122   UROBILINOGEN 0.2 01/12/2016 1122   NITRITE NEGATIVE 01/12/2016 1122   LEUKOCYTESUR NEGATIVE 01/12/2016 1122    ELIGIBLE FOR AVAILABLE RESEARCH PROTOCOL: PREVENT?  STUDIES: US Ob Comp Less 14 Wks  03-20-16  CLINICAL DATA:  40 year old pregnant female presenting for assessment of fetal location, dating and  viability. Quantitative beta HCG 244 on 02/25/2016. EDC by LMP: 03/20/16, projecting to an expected gestational age of [redacted] weeks 5 days. EXAM: OBSTETRIC <14 WK [place] AND TRANSVAGINAL OB US TECHNIQUE: Both transabdominal and transvaginal ultrasound examinations were performed for complete evaluation of the gestation as  well as the maternal uterus, adnexal regions, and pelvic cul-de-sac. Transvaginal technique was performed to assess early pregnancy. COMPARISON:  None. FINDINGS: Intrauterine gestational sac: Single intrauterine gestational sac appears normal in size, shape and position. Yolk sac:  Present and normal (best seen on transabdominal images). Embryo:  Not visualized. Embryonic Cardiac Activity: Not visualized. MSD: 12.6  mm   6 w   1  d    [place] EDC: 10/30/2016 Subchorionic hemorrhage:  None visualized. Maternal uterus/adnexae: Left ovary measures 3.6 x 4.0 x 3.8 cm. Right ovary measures 3.3 x 1.8 x 3.9 cm and contains a 1.9 cm corpus luteum (this structure is contained within and moves with the right ovary per discussion with the technologist). No suspicious ovarian or adnexal masses. No abnormal free fluid in the pelvis. No uterine fibroids are demonstrated. IMPRESSION: 1. Single intrauterine gestational sac with yolk sac at 6 weeks 1 day by mean sac diameter. No embryo or embryonic cardiac activity detected, which could be due to early gestational age. Recommend follow-up obstetric scan in 2 weeks or earlier as clinically warranted. 2. No suspicious ovarian or adnexal findings. Electronically Signed   By: Ilona Sorrel M.D.   On: 03/07/2016 11:08   US Ob Transvaginal  03/07/2016  CLINICAL DATA:  40 year old pregnant female presenting for assessment of fetal location, dating and viability. Quantitative beta HCG 244 on 02/25/2016. EDC by LMP: 03/07/2016, projecting to an expected gestational age of [redacted] weeks 5 days. EXAM: OBSTETRIC <14 WK [place] AND TRANSVAGINAL OB US TECHNIQUE: Both transabdominal and transvaginal  ultrasound examinations were performed for complete evaluation of the gestation as well as the maternal uterus, adnexal regions, and pelvic cul-de-sac. Transvaginal technique was performed to assess early pregnancy. COMPARISON:  None. FINDINGS: Intrauterine gestational sac: Single intrauterine gestational sac appears normal in size, shape and position. Yolk sac:  Present and normal (best seen on transabdominal images). Embryo:  Not visualized. Embryonic Cardiac Activity: Not visualized. MSD: 12.6  mm   6 w   1  d    [place] EDC: 10/30/2016 Subchorionic hemorrhage:  None visualized. Maternal uterus/adnexae: Left ovary measures 3.6 x 4.0 x 3.8 cm. Right ovary measures 3.3 x 1.8 x 3.9 cm and contains a 1.9 cm corpus luteum (this structure is contained within and moves with the right ovary per discussion with the technologist). No suspicious ovarian or adnexal masses. No abnormal free fluid in the pelvis. No uterine fibroids are demonstrated. IMPRESSION: 1. Single intrauterine gestational sac with yolk sac at 6 weeks 1 day by mean sac diameter. No embryo or embryonic cardiac activity detected, which could be due to early gestational age. Recommend follow-up obstetric scan in 2 weeks or earlier as clinically warranted. 2. No suspicious ovarian or adnexal findings. Electronically Signed   By: Ilona Sorrel M.D.   On: 03/07/2016 11:08    ASSESSMENT: 40 y.o. [place] woman status post right breast biopsy 01/28/2016 for a clinical T2 N0 invasive ductal carcinoma, grade 3, triple negative, with an MIB-1 of 70%.  (a) suspicious right axillary lymph node biopsied 01/28/2016 was benign  (1) neoadjuvant chemotherapy will consist of doxorubicin and cyclophosphamide in dose dense fashion 4 followed by paclitaxel and carboplatin weekly 12  (2) genetics testing is pending  (3) definitive surgery to follow  (5) adjuvant radiation to follow as appropriate  PLAN: We are going to have to delay chemotherapy possibly for the  next several weeks, until the pregnancy is terminated and she recovers. Currently, her financial situation is the hold  up. She tells me the procedure at [facility] [place] will cost approximately $300-400, money that she does not have right now. At best, she can have it by the end of this month. We are working with Loren Racer, our licensed social worker, to see if we can not get this billed under "medical necessity" so that we can start neoadjuvant treatment as soon as possible. It is hard to estimate when that will be. I am canceling her port placement tomorrow and the infusion appointment we had set up next week.   Camisha will return in 2 weeks for follow up. Hopefully at that time we will have more definite timeline that I can work with. She understands and agrees with this plan. She has been encouraged to call with any issues that might arise before her next visit here.   Total time spent in appointment was 30 minutes, with greater than 50% of the time spent face to face with the patient.    Laurie Panda, NP   03/10/2016 4:01 PM

## 2016-03-10 NOTE — Telephone Encounter (Signed)
cxl appts per HB 4/6 pof and made appt 4/21 per HB. Avs printed

## 2016-03-11 ENCOUNTER — Other Ambulatory Visit (HOSPITAL_COMMUNITY): Payer: Medicaid Other

## 2016-03-11 ENCOUNTER — Ambulatory Visit (HOSPITAL_COMMUNITY): Payer: Medicaid Other

## 2016-03-11 ENCOUNTER — Encounter: Payer: Self-pay | Admitting: *Deleted

## 2016-03-11 NOTE — Progress Notes (Signed)
[place] Work  Clinical Social Work was referred by Designer, jewellery for assessment of psychosocial needs ad assistance as pt requesting to proceed with termination of her pregnancy in order to pursue active treatment. .  Clinical Social Worker contacted patient via phone and discussed situation with her. Pt firm in her decision and choice. CSW discussed resources and options to assist. Pt has appt at [facility] later this month and needs assistance finding funds as medicaid does not cover any pregnancy termination. CSW provided pt with contacts for these funds. Pt agrees to contact and pursue. Pt also returning to [facility] on 4/10 to meet with financial counselor and CW to pursue other assistance funds related to her cancer care. Pt not feeling well today and not able to come to [facility] to meet with Shauna as scheduled. Pt reports to be coping adequately at this time. CSW to follow and assist.     Clinical Social Work interventions: Supportive Engineer, agricultural and resource assistance  Loren Racer, [place]  [place] Phone: [phone] Fax: [phone]

## 2016-03-14 ENCOUNTER — Ambulatory Visit: Payer: Medicaid Other

## 2016-03-14 ENCOUNTER — Encounter: Payer: Self-pay | Admitting: *Deleted

## 2016-03-14 ENCOUNTER — Other Ambulatory Visit: Payer: Medicaid Other
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
## 2016-03-15 ENCOUNTER — Ambulatory Visit: Payer: Medicaid Other | Admitting: Oncology

## 2016-03-15 ENCOUNTER — Other Ambulatory Visit: Payer: Medicaid Other

## 2016-03-15 ENCOUNTER — Ambulatory Visit: Payer: Medicaid Other

## 2016-03-22 ENCOUNTER — Ambulatory Visit: Payer: Medicaid Other

## 2016-03-22 ENCOUNTER — Ambulatory Visit: Payer: Medicaid Other | Admitting: Nurse Practitioner

## 2016-03-22 ENCOUNTER — Other Ambulatory Visit: Payer: Medicaid Other

## 2016-03-25 ENCOUNTER — Encounter: Payer: Self-pay | Admitting: *Deleted

## 2016-03-25 ENCOUNTER — Telehealth: Payer: Self-pay | Admitting: Nurse Practitioner

## 2016-03-25 ENCOUNTER — Ambulatory Visit (HOSPITAL_BASED_OUTPATIENT_CLINIC_OR_DEPARTMENT_OTHER): Payer: Medicaid Other | Admitting: Nurse Practitioner

## 2016-03-25 ENCOUNTER — Encounter: Payer: Self-pay | Admitting: Nurse Practitioner

## 2016-03-25 VITALS — BP 112/60 | HR 71 | Temp 98.2°F | Resp 18 | Wt 187.6 lb

## 2016-03-25 DIAGNOSIS — Z720 Tobacco use: Secondary | ICD-10-CM | POA: Diagnosis not present

## 2016-03-25 DIAGNOSIS — M545 Low back pain: Secondary | ICD-10-CM

## 2016-03-25 DIAGNOSIS — Z171 Estrogen receptor negative status [ER-]: Secondary | ICD-10-CM

## 2016-03-25 DIAGNOSIS — N393 Stress incontinence (female) (male): Secondary | ICD-10-CM

## 2016-03-25 DIAGNOSIS — Z3490 Encounter for supervision of normal pregnancy, unspecified, unspecified trimester: Secondary | ICD-10-CM

## 2016-03-25 DIAGNOSIS — C50411 Malignant neoplasm of upper-outer quadrant of right female breast: Secondary | ICD-10-CM | POA: Diagnosis not present

## 2016-03-25 NOTE — Telephone Encounter (Signed)
appt made and avs printed °

## 2016-03-25 NOTE — Progress Notes (Signed)
[place]  [phone] [phone]   ID: [name] DOB: 1976-08-14  MR#: [id number]  MCN#:[id number]  Patient Care Team: Dorena Dew, FNP as PCP - General (Family Medicine) Alphonsa Overall, MD as Consulting Physician (General Surgery) Chauncey Cruel, MD as Consulting Physician (Oncology) Gery Pray, MD as Consulting Physician (Radiation Oncology) Sylvan Cheese, NP as Nurse Practitioner (Hematology and Oncology) PCP: Dorena Dew, FNP GYN: OTHER MD:  CHIEF COMPLAINT: triple negative breast cancer  CURRENT TREATMENT: neoadjuvant chemotherapy pending  BREAST CANCER HISTORY: From the original intake note:  Laterra herself noted a change in her right breast sometime around September or October 2016. She did not bring it to intermediate medical attention, but on 01/13/2016 she established herself in Dr. Smith Robert' service and she was set up for bilateral diagnostic mammography with tomosynthesis and bilateral ultrasonography at the [facility] 01/19/2016. The breast density was category B. In the upper outer quadrant of the right breast there was a spiculated mass measuring 2.8 cm. On physical exam this was palpable. Targeted ultrasonography confirmed an irregular hypoechoic mass in the right breast 11:30 o'clock position measuring 2.6 cm maximally. Ultrasound of the right axilla showed a morphologically abnormal lymph node.  In the left breast there were some tubular densities behind the areola which by ultrasonography showed benign ductal ectasia.  On 01/28/2016 [place] underwent biopsy of the right breast mass and abnormal right axillary lymph node. The pathology from this procedure ([facility] [id number]) showed the lymph node to be benign. In the breast however there was an invasive ductal carcinoma, grade 3, which was estrogen and progesterone receptor negative. The proliferation marker was 70%. HER-2 was not amplified with a signals ratio of  1.32. The number per cell was 2.05.  The patient's subsequent history is as detailed below  INTERVAL HISTORY: Kasey returns today for further discussion of her breast cancer treatment, alone. Since her last visit she has been working with our Education officer, museum, Loren Racer, to arrange for the termination of her pregnancy. This has been scheduled for 04/01/16. Once she starts chemotherapy she will most likely be approved for a few grants related to breast cancer. She was also provided bus passes and information for resources that can help her with food and other assistance.  REVIEW OF SYSTEMS: Humaira has some pain in her back. She has mild sinus symptoms. She has a little bit of a cough. Her appetite is poor. She continues to have stress urinary incontinence issues. She has some eczema. She feels weak, anxious, and depressed. A detailed review of systems was otherwise stable  PAST MEDICAL HISTORY: Past Medical History  Diagnosis Date  . Breast cancer ([place])   . Depression   . Anxiety   . Sickle cell trait ([place])   . Obesity (BMI 35.0-39.9 without comorbidity) ([place])   . Hypertension     pt states is currently on no medications   . Seasonal allergies   . GERD (gastroesophageal reflux disease)     PAST SURGICAL HISTORY: Past Surgical History  Procedure Laterality Date  . Cesarean section      2004 and 2007    FAMILY HISTORY Family History  Problem Relation Age of Onset  . Hypertension Mother   . Uterine cancer Mother   . Cancer Father   . Hypertension Father   . Breast cancer Maternal Aunt   . Prostate cancer Maternal Uncle   . Breast cancer Maternal Grandmother   . Throat cancer Maternal Grandfather   .  Breast cancer Cousin   The patient has very little information about her father. Her mother is currently 15 years old. She had a history of cervical cancer at age 16. The patient had 2 brothers, no sisters. The maternal grandfather had throat cancer. A maternal uncle was diagnosed with  breast cancer as well as prostate cancer at the age of 75. 2 maternal cousins, one of the mail, had breast cancer as well.  GYNECOLOGIC HISTORY:  No LMP recorded. Menarche age 40, first live birth age 10. The patient is GX P4. She still having regular periods. She took oral contraceptives in the 1990s with no side effects.  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 The patient's significant other [name] works at [employer]. In addition to them the patient's 3 children Chasmine [place], [place] and [name] are also in the home. There are age 42, 24, and 27. The patient's son [name], currently 42 years old, lives in [place].    ADVANCED DIRECTIVES: Not in place   HEALTH MAINTENANCE: Social History  Substance Use Topics  . Smoking status: Current Every Day Smoker -- 0.25 packs/day for 20 years    Types: Cigarettes  . Smokeless tobacco: Never Used  . Alcohol Use: Yes     Comment: occ     Colonoscopy:  PAP:  Bone density:  Lipid panel:  No Known Allergies  Current Outpatient Prescriptions  Medication Sig Dispense Refill  . dexamethasone (DECADRON) 4 MG tablet Take 2 tablets by mouth once a day on the day after chemotherapy and then take 2 tablets two times a day for 2 days. Take with food. (Patient not taking: Reported on 02/16/2016) 30 tablet 1  . lidocaine-prilocaine (EMLA) cream Apply to affected area once (Patient not taking: Reported on 02/16/2016) 30 g 3  . LORazepam (ATIVAN) 0.5 MG tablet Take 1 tablet (0.5 mg total) by mouth at bedtime. (Patient not taking: Reported on 03/02/2016) 30 tablet 0  . ondansetron (ZOFRAN) 8 MG tablet Take 1 tablet (8 mg total) by mouth 2 (two) times daily as needed. Start on the third day after chemotherapy. (Patient not taking: Reported on 03/10/2016) 30 tablet 1  . OVER THE COUNTER MEDICATION Take 1 Dose by mouth every other day. Reported on 03/10/2016    .  prochlorperazine (COMPAZINE) 10 MG tablet Take 1 tablet (10 mg total) by mouth every 6 (six) hours as needed (Nausea or vomiting). (Patient not taking: Reported on 02/16/2016) 30 tablet 1   No current facility-administered medications for this visit.    OBJECTIVE: [name] Who appears stated age 20 Vitals:   03/25/16 1014  BP: 112/60  Pulse: 71  Temp: 98.2 F (36.8 C)  Resp: 18     Body mass index is 34.88 kg/(m^2).    ECOG FS:1 - Symptomatic but completely ambulatory  Skin: warm, dry  HEENT: sclerae anicteric, conjunctivae pink, oropharynx clear. No thrush or mucositis.  Lymph Nodes: No cervical or supraclavicular lymphadenopathy  Lungs: clear to auscultation bilaterally, no rales, wheezes, or rhonci  Heart: regular rate and rhythm  Abdomen: round, soft, non tender, positive bowel sounds  Musculoskeletal: No focal spinal tenderness, no peripheral edema  Neuro: non focal, well oriented, positive affect  Breasts: deferred  LAB RESULTS:  CMP     Component Value Date/Time   NA 140 02/10/2016 0852   NA 140 01/12/2016 1115  K 3.9 02/10/2016 0852   K 4.0 01/12/2016 1115   CL 107 01/12/2016 1115   CO2 24 02/10/2016 0852   CO2 22 01/12/2016 1115   GLUCOSE 91 02/10/2016 0852   GLUCOSE 69 01/12/2016 1115   BUN 6.6* 02/10/2016 0852   BUN 7 01/12/2016 1115   CREATININE 0.8 02/10/2016 0852   CREATININE 0.75 01/12/2016 1115   CREATININE 0.80 05/19/2012 1817   CALCIUM 9.1 02/10/2016 0852   CALCIUM 8.8 01/12/2016 1115   PROT 7.4 02/10/2016 0852   PROT 7.1 01/12/2016 1115   ALBUMIN 3.8 02/10/2016 0852   ALBUMIN 4.2 01/12/2016 1115   AST 11 02/10/2016 0852   AST 12 01/12/2016 1115   ALT <9 02/10/2016 0852   ALT 8 01/12/2016 1115   ALKPHOS 51 02/10/2016 0852   ALKPHOS 47 01/12/2016 1115   BILITOT <0.30 02/10/2016 0852   BILITOT 0.4 01/12/2016 1115   GFRNONAA >89 01/12/2016 1115   GFRAA >89 01/12/2016 1115    INo results found for: SPEP, UPEP  Lab  Results  Component Value Date   WBC 11.1* 02/10/2016   NEUTROABS 7.5* 02/10/2016   HGB 12.2 02/10/2016   HCT 35.6 02/10/2016   MCV 89.4 02/10/2016   PLT 224 02/10/2016      Chemistry      Component Value Date/Time   NA 140 02/10/2016 0852   NA 140 01/12/2016 1115   K 3.9 02/10/2016 0852   K 4.0 01/12/2016 1115   CL 107 01/12/2016 1115   CO2 24 02/10/2016 0852   CO2 22 01/12/2016 1115   BUN 6.6* 02/10/2016 0852   BUN 7 01/12/2016 1115   CREATININE 0.8 02/10/2016 0852   CREATININE 0.75 01/12/2016 1115   CREATININE 0.80 05/19/2012 1817      Component Value Date/Time   CALCIUM 9.1 02/10/2016 0852   CALCIUM 8.8 01/12/2016 1115   ALKPHOS 51 02/10/2016 0852   ALKPHOS 47 01/12/2016 1115   AST 11 02/10/2016 0852   AST 12 01/12/2016 1115   ALT <9 02/10/2016 0852   ALT 8 01/12/2016 1115   BILITOT <0.30 02/10/2016 0852   BILITOT 0.4 01/12/2016 1115       No results found for: LABCA2  No components found for: NOMVE720  No results for input(s): INR in the last 168 hours.  Urinalysis    Component Value Date/Time   LABSPEC 1.020 01/12/2016 1122   PHURINE 5.5 01/12/2016 1122   GLUCOSEU NEGATIVE 01/12/2016 1122   HGBUR TRACE* 01/12/2016 1122   BILIRUBINUR NEGATIVE 01/12/2016 1122   KETONESUR NEGATIVE 01/12/2016 1122   PROTEINUR NEGATIVE 01/12/2016 1122   UROBILINOGEN 0.2 01/12/2016 1122   NITRITE NEGATIVE 01/12/2016 1122   LEUKOCYTESUR NEGATIVE 01/12/2016 1122    ELIGIBLE FOR AVAILABLE RESEARCH PROTOCOL: PREVENT?  STUDIES: US Ob Comp Less 14 Wks  Mar 08, 2016  CLINICAL DATA:  40 year old pregnant female presenting for assessment of fetal location, dating and viability. Quantitative beta HCG 244 on 02/25/2016. EDC by LMP: 2016-03-08, projecting to an expected gestational age of [redacted] weeks 5 days. EXAM: OBSTETRIC <14 WK [place] AND TRANSVAGINAL OB US TECHNIQUE: Both transabdominal and transvaginal ultrasound examinations were performed for complete evaluation of the gestation as  well as the maternal uterus, adnexal regions, and pelvic cul-de-sac. Transvaginal technique was performed to assess early pregnancy. COMPARISON:  None. FINDINGS: Intrauterine gestational sac: Single intrauterine gestational sac appears normal in size, shape and position. Yolk sac:  Present and normal (best seen on transabdominal images). Embryo:  Not visualized. Embryonic Cardiac Activity: Not  visualized. MSD: 12.6  mm   6 w   1  d    [place] EDC: 10/30/2016 Subchorionic hemorrhage:  None visualized. Maternal uterus/adnexae: Left ovary measures 3.6 x 4.0 x 3.8 cm. Right ovary measures 3.3 x 1.8 x 3.9 cm and contains a 1.9 cm corpus luteum (this structure is contained within and moves with the right ovary per discussion with the technologist). No suspicious ovarian or adnexal masses. No abnormal free fluid in the pelvis. No uterine fibroids are demonstrated. IMPRESSION: 1. Single intrauterine gestational sac with yolk sac at 6 weeks 1 day by mean sac diameter. No embryo or embryonic cardiac activity detected, which could be due to early gestational age. Recommend follow-up obstetric scan in 2 weeks or earlier as clinically warranted. 2. No suspicious ovarian or adnexal findings. Electronically Signed   By: Ilona Sorrel M.D.   On: 03/07/2016 11:08   US Ob Transvaginal  03/07/2016  CLINICAL DATA:  40 year old pregnant female presenting for assessment of fetal location, dating and viability. Quantitative beta HCG 244 on 02/25/2016. EDC by LMP: 03/07/2016, projecting to an expected gestational age of [redacted] weeks 5 days. EXAM: OBSTETRIC <14 WK [place] AND TRANSVAGINAL OB US TECHNIQUE: Both transabdominal and transvaginal ultrasound examinations were performed for complete evaluation of the gestation as well as the maternal uterus, adnexal regions, and pelvic cul-de-sac. Transvaginal technique was performed to assess early pregnancy. COMPARISON:  None. FINDINGS: Intrauterine gestational sac: Single intrauterine gestational sac appears  normal in size, shape and position. Yolk sac:  Present and normal (best seen on transabdominal images). Embryo:  Not visualized. Embryonic Cardiac Activity: Not visualized. MSD: 12.6  mm   6 w   1  d    [place] EDC: 10/30/2016 Subchorionic hemorrhage:  None visualized. Maternal uterus/adnexae: Left ovary measures 3.6 x 4.0 x 3.8 cm. Right ovary measures 3.3 x 1.8 x 3.9 cm and contains a 1.9 cm corpus luteum (this structure is contained within and moves with the right ovary per discussion with the technologist). No suspicious ovarian or adnexal masses. No abnormal free fluid in the pelvis. No uterine fibroids are demonstrated. IMPRESSION: 1. Single intrauterine gestational sac with yolk sac at 6 weeks 1 day by mean sac diameter. No embryo or embryonic cardiac activity detected, which could be due to early gestational age. Recommend follow-up obstetric scan in 2 weeks or earlier as clinically warranted. 2. No suspicious ovarian or adnexal findings. Electronically Signed   By: Ilona Sorrel M.D.   On: 03/07/2016 11:08    ASSESSMENT: 40 y.o. [place] woman status post right breast biopsy 01/28/2016 for a clinical T2 N0 invasive ductal carcinoma, grade 3, triple negative, with an MIB-1 of 70%.  (a) suspicious right axillary lymph node biopsied 01/28/2016 was benign  (1) neoadjuvant chemotherapy will consist of doxorubicin and cyclophosphamide in dose dense fashion 4 followed by paclitaxel and carboplatin weekly 12  (2) genetics testing is pending  (3) definitive surgery to follow  (5) adjuvant radiation to follow as appropriate  PLAN: Verania and I spent about 40 minutes discussing her upcoming chemotherapy plans. She was made aware of potential side effects and toxicities of both cyclophosphamide and doxorubicin. She attended chemotherapy school last month so a lot of this information was a review for her. She was made aware of her neulasta injections, including the fact that these will be administered with  an onbody injector that she will need to wear until the device is no longer active.   Finally, we reviewed her antiemetic  schedule, and she feels comfortable with every medication listed as well as the indication and potential side effects. She has already picked these medications up from her [place] expects to terminate her pregnancy on 4/28. She will have her port placed on 5/8 and begin her first cycle of treatment on 5/11 if she feels up to it. I explained that if she needed an additional week to recover, I could arrange this. She understands and agrees with this plan. She knows the goal of treatment in her case is cure. She has been encouraged to call with any issues that might arise before her next visit here.   Laurie Panda, NP   03/25/2016 10:55 AM

## 2016-03-25 NOTE — Progress Notes (Signed)
[facility] Clinical Social Work  Clinical Social Work contacted by the [employer]. They state they could possibly assist pt with some emergency assistance funds. Pt contacted and encouraged to call them ASAP. Pt stated she had phoned, but did not leave a message previously as their phone recording states they are out of funds. CSW educated pt that that was a standard greeting and to contact them. Pt planning to call them asap and will update CSW. Pt stated they were ok on food currently. CSW reminded pt to connect with financial advocates as well.   Clinical Social Work interventions: Resource assistance and coordination  Loren Racer, [place] Worker [place]  [place] Phone: [phone] Fax: [phone]

## 2016-03-28 ENCOUNTER — Encounter: Payer: Medicaid Other | Admitting: Genetic Counselor

## 2016-03-28 ENCOUNTER — Encounter: Payer: Self-pay | Admitting: Oncology

## 2016-03-28 ENCOUNTER — Other Ambulatory Visit: Payer: Medicaid Other

## 2016-03-28 ENCOUNTER — Encounter: Payer: Self-pay | Admitting: *Deleted

## 2016-03-28 NOTE — Progress Notes (Signed)
Pt is approved for the $1000 [employer] grant.  

## 2016-03-28 NOTE — Progress Notes (Signed)
Rebecca [name]  Clinical Social [name] phoned pt to check in and reassess needs. CSW left pt message to return CSW call about resource options and assistance.  Rebecca [name], [name] Worker [name]  [name] Phone: [phone] Fax: [phone]

## 2016-03-29 ENCOUNTER — Telehealth: Payer: Self-pay | Admitting: Genetic Counselor

## 2016-03-29 NOTE — Telephone Encounter (Signed)
Lt mess for pt to reschedule genetic counseling appt.

## 2016-04-02 DIAGNOSIS — Z332 Encounter for elective termination of pregnancy: Secondary | ICD-10-CM

## 2016-04-02 HISTORY — DX: Encounter for elective termination of pregnancy: Z33.2

## 2016-04-04 ENCOUNTER — Encounter: Payer: Self-pay | Admitting: *Deleted

## 2016-04-04 NOTE — Progress Notes (Signed)
[place] Work  Clinical Social Work left message checking in with pt for assessment of psychosocial needs.  Pt reminded of grant funds to assist with bill as she starts treatment soon. CSW encouraged pt to return call. Clinical Social Worker to continue to follow and assist as needed.   Loren Racer, [place] Worker [place]  [place] Phone: [phone] Fax: [phone]

## 2016-04-05 ENCOUNTER — Ambulatory Visit: Payer: Medicaid Other

## 2016-04-05 ENCOUNTER — Other Ambulatory Visit: Payer: Medicaid Other

## 2016-04-06 ENCOUNTER — Other Ambulatory Visit: Payer: Self-pay | Admitting: Radiology

## 2016-04-07 ENCOUNTER — Other Ambulatory Visit: Payer: Self-pay | Admitting: Radiology

## 2016-04-11 ENCOUNTER — Ambulatory Visit (HOSPITAL_COMMUNITY)
Admission: RE | Admit: 2016-04-11 | Discharge: 2016-04-11 | Disposition: A | Discharge status: Home / Self Care | Payer: Medicaid Other | Source: Ambulatory Visit | Attending: Nurse Practitioner | Admitting: Nurse Practitioner

## 2016-04-11 ENCOUNTER — Other Ambulatory Visit: Payer: Medicaid Other

## 2016-04-11 ENCOUNTER — Encounter (HOSPITAL_COMMUNITY): Payer: Self-pay

## 2016-04-11 ENCOUNTER — Ambulatory Visit (HOSPITAL_COMMUNITY)
Admission: RE | Admit: 2016-04-11 | Discharge: 2016-04-11 | Disposition: A | Discharge status: Home / Self Care | Payer: Medicaid Other | Source: Ambulatory Visit | Attending: Oncology | Admitting: Oncology

## 2016-04-11 ENCOUNTER — Other Ambulatory Visit: Payer: Self-pay | Admitting: Nurse Practitioner

## 2016-04-11 DIAGNOSIS — F17210 Nicotine dependence, cigarettes, uncomplicated: Secondary | ICD-10-CM | POA: Insufficient documentation

## 2016-04-11 DIAGNOSIS — Z6834 Body mass index (BMI) 34.0-34.9, adult: Secondary | ICD-10-CM | POA: Diagnosis not present

## 2016-04-11 DIAGNOSIS — Z803 Family history of malignant neoplasm of breast: Secondary | ICD-10-CM | POA: Diagnosis not present

## 2016-04-11 DIAGNOSIS — C50411 Malignant neoplasm of upper-outer quadrant of right female breast: Secondary | ICD-10-CM

## 2016-04-11 DIAGNOSIS — F329 Major depressive disorder, single episode, unspecified: Secondary | ICD-10-CM | POA: Insufficient documentation

## 2016-04-11 DIAGNOSIS — D573 Sickle-cell trait: Secondary | ICD-10-CM | POA: Insufficient documentation

## 2016-04-11 DIAGNOSIS — E669 Obesity, unspecified: Secondary | ICD-10-CM | POA: Insufficient documentation

## 2016-04-11 DIAGNOSIS — C50919 Malignant neoplasm of unspecified site of unspecified female breast: Secondary | ICD-10-CM | POA: Diagnosis present

## 2016-04-11 DIAGNOSIS — I10 Essential (primary) hypertension: Secondary | ICD-10-CM | POA: Insufficient documentation

## 2016-04-11 DIAGNOSIS — Z8249 Family history of ischemic heart disease and other diseases of the circulatory system: Secondary | ICD-10-CM | POA: Insufficient documentation

## 2016-04-11 DIAGNOSIS — F419 Anxiety disorder, unspecified: Secondary | ICD-10-CM | POA: Diagnosis not present

## 2016-04-11 DIAGNOSIS — K219 Gastro-esophageal reflux disease without esophagitis: Secondary | ICD-10-CM | POA: Insufficient documentation

## 2016-04-11 HISTORY — DX: Encounter for elective termination of pregnancy: Z33.2

## 2016-04-11 LAB — BASIC METABOLIC PANEL
ANION GAP: 6 (ref 5–15)
BUN: 5 mg/dL — AB (ref 6–20)
CALCIUM: 8.6 mg/dL — AB (ref 8.9–10.3)
CO2: 25 mmol/L (ref 22–32)
CREATININE: 0.78 mg/dL (ref 0.44–1.00)
Chloride: 106 mmol/L (ref 101–111)
GFR calc Af Amer: >60 mL/min (ref >60)
GLUCOSE: 84 mg/dL (ref 65–99)
Potassium: 3.4 mmol/L — ABNORMAL LOW (ref 3.5–5.1)
Sodium: 137 mmol/L (ref 135–145)

## 2016-04-11 LAB — CBC
HEMATOCRIT: 31.6 % — AB (ref 36.0–46.0)
Hemoglobin: 11 g/dL — ABNORMAL LOW (ref 12.0–15.0)
MCH: 30.3 pg (ref 26.0–34.0)
MCHC: 34.8 g/dL (ref 30.0–36.0)
MCV: 87.1 fL (ref 78.0–100.0)
Platelets: 244 10*3/uL (ref 150–400)
RBC: 3.63 MIL/uL — ABNORMAL LOW (ref 3.87–5.11)
RDW: 13.5 % (ref 11.5–15.5)
WBC: 10.7 10*3/uL — ABNORMAL HIGH (ref 4.0–10.5)

## 2016-04-11 LAB — APTT: APTT: 30 s (ref 24–37)

## 2016-04-11 LAB — HCG, SERUM, QUALITATIVE: Preg, Serum: POSITIVE — AB

## 2016-04-11 LAB — PROTIME-INR
INR: 1.31 (ref 0.00–1.49)
PROTHROMBIN TIME: 15.9 s — AB (ref 11.6–15.2)

## 2016-04-11 MED ORDER — CEFAZOLIN SODIUM-DEXTROSE 2-4 GM/100ML-% IV SOLN
2.0000 g | INTRAVENOUS | Status: AC
  Administered 2016-04-11: 2 g via INTRAVENOUS
  Filled 2016-04-11: qty 100

## 2016-04-11 MED ORDER — HEPARIN SOD (PORK) LOCK FLUSH 100 UNIT/ML IV SOLN
INTRAVENOUS | Status: AC
  Filled 2016-04-11: qty 5

## 2016-04-11 MED ORDER — LIDOCAINE-EPINEPHRINE (PF) 2 %-1:200000 IJ SOLN
INTRAMUSCULAR | Status: AC
  Filled 2016-04-11: qty 20

## 2016-04-11 MED ORDER — FENTANYL CITRATE (PF) 100 MCG/2ML IJ SOLN
INTRAMUSCULAR | Status: AC | PRN
  Administered 2016-04-11: 50 ug via INTRAVENOUS

## 2016-04-11 MED ORDER — LIDOCAINE-EPINEPHRINE (PF) 2 %-1:200000 IJ SOLN
INTRAMUSCULAR | Status: AC | PRN
  Administered 2016-04-11: 20 mL

## 2016-04-11 MED ORDER — FENTANYL CITRATE (PF) 100 MCG/2ML IJ SOLN
INTRAMUSCULAR | Status: AC
  Filled 2016-04-11: qty 4

## 2016-04-11 MED ORDER — SODIUM CHLORIDE 0.9 % IV SOLN
Freq: Once | INTRAVENOUS | Status: AC
  Administered 2016-04-11: 11:00:00 via INTRAVENOUS

## 2016-04-11 MED ORDER — HEPARIN SOD (PORK) LOCK FLUSH 100 UNIT/ML IV SOLN
INTRAVENOUS | Status: AC | PRN
  Administered 2016-04-11: 500 [IU]

## 2016-04-11 MED ORDER — MIDAZOLAM HCL 2 MG/2ML IJ SOLN
INTRAMUSCULAR | Status: AC | PRN
  Administered 2016-04-11 (×4): 1 mg via INTRAVENOUS

## 2016-04-11 MED ORDER — MIDAZOLAM HCL 2 MG/2ML IJ SOLN
INTRAMUSCULAR | Status: AC
  Filled 2016-04-11: qty 6

## 2016-04-11 NOTE — H&P (Signed)
Chief Complaint: breast cancer  Referring Physician:Dr. Lurline Del  Supervising Physician: Arne [place]  Patient Status: Out-pt  HPI: Rebecca [name] is an 40 y.o. female who was recently diagnosed with breast cancer.  This has been complicated due to an unexpected pregnancy.  Some of her treatments have been delayed secondary to this pregnancy.  She underwent an elective abortion about a week and a half ago.  She states that she has passed some contents since taking the pill she was prescribed.  She has had some nausea recently, but otherwise no other symptoms.  She presents today for [facility] placement so she can initiate treatment.  Past Medical History:  Past Medical History  Diagnosis Date  . Breast cancer ([place])   . Depression   . Anxiety   . Sickle cell trait ([place])   . Obesity (BMI 35.0-39.9 without comorbidity) ([place])   . Hypertension     pt states is currently on no medications   . Seasonal allergies   . GERD (gastroesophageal reflux disease)   . Termination of pregnancy (fetus) 04/02/16    Past Surgical History:  Past Surgical History  Procedure Laterality Date  . Cesarean section      2004 and 2007    Family History:  Family History  Problem Relation Age of Onset  . Hypertension Mother   . Uterine cancer Mother   . Cancer Father   . Hypertension Father   . Breast cancer Maternal Aunt   . Prostate cancer Maternal Uncle   . Breast cancer Maternal Grandmother   . Throat cancer Maternal Grandfather   . Breast cancer Cousin     Social History:  reports that she has been smoking Cigarettes.  She has a 5 pack-year smoking history. She has never used smokeless tobacco. She reports that she drinks alcohol. She reports that she does not use illicit drugs.  Allergies: No Known Allergies  Medications:   Medication List    ASK your doctor about these medications        dexamethasone 4 MG tablet  Commonly known as:  DECADRON  Take 2 tablets by mouth once a day  on the day after chemotherapy and then take 2 tablets two times a day for 2 days. Take with food.     lidocaine-prilocaine cream  Commonly known as:  EMLA  Apply to affected area once     LORazepam 0.5 MG tablet  Commonly known as:  ATIVAN  Take 1 tablet (0.5 mg total) by mouth at bedtime.     ondansetron 8 MG tablet  Commonly known as:  ZOFRAN  Take 1 tablet (8 mg total) by mouth 2 (two) times daily as needed. Start on the third day after chemotherapy.     OVER THE COUNTER MEDICATION  Take 1 Dose by mouth every other day. Reported on 03/10/2016     prochlorperazine 10 MG tablet  Commonly known as:  COMPAZINE  Take 1 tablet (10 mg total) by mouth every 6 (six) hours as needed (Nausea or vomiting).        Please HPI for pertinent positives, otherwise complete 10 system ROS negative.  Mallampati Score: MD Evaluation Airway: WNL Heart: WNL Abdomen: WNL Chest/ Lungs: WNL ASA  Classification: 3 Mallampati/Airway Score: Two  Physical Exam: BP 125/79 mmHg  Pulse 95  Temp(Src) 97.4 F (36.3 C) (Oral)  Resp 16  Wt 188 lb (85.276 kg)  SpO2 100% Body mass index is 34.95 kg/(m^2). General: pleasant, WD, WN black female  who is laying in bed in NAD HEENT: head is normocephalic, atraumatic.  Sclera are noninjected.  PERRL.  Ears and nose without any masses or lesions.  Mouth is pink and moist Heart: regular, rate, and rhythm.  Normal s1,s2. No obvious murmurs, gallops, or rubs noted.  Palpable radial and pedal pulses bilaterally Lungs: CTAB, no wheezes, rhonchi, or rales noted.  Respiratory effort nonlabored Abd: soft, NT, ND, +BS, no masses, hernias, or organomegaly MS: all 4 extremities are symmetrical with no cyanosis, clubbing, or edema. Psych: A&Ox3 with an appropriate affect.   Labs: Results for orders placed or performed during the hospital encounter of 04/11/16 (from the past 48 hour(s))  CBC     Status: Abnormal   Collection Time: 04/11/16 10:45 AM  Result Value Ref  Range   WBC 10.7 (H) 4.0 - 10.5 K/uL   RBC 3.63 (L) 3.87 - 5.11 MIL/uL   Hemoglobin 11.0 (L) 12.0 - 15.0 g/dL   HCT 31.6 (L) 36.0 - 46.0 %   MCV 87.1 78.0 - 100.0 fL   MCH 30.3 26.0 - 34.0 pg   MCHC 34.8 30.0 - 36.0 g/dL   RDW 13.5 11.5 - 15.5 %   Platelets 244 150 - 400 K/uL    Imaging: No results found.  Assessment/Plan 1. Breast cancer -plan for placement of a PAC today, hcg pending just to confirm complete termination of pregnancy. -vitals reviewed, cbc reviewed, but other labs are pending. -Risks and Benefits discussed with the patient including, but not limited to bleeding, infection, pneumothorax, or fibrin sheath development and need for additional procedures. All of the patient's questions were answered, patient is agreeable to proceed. Consent signed and in chart.    Thank you for this interesting consult.  I greatly enjoyed meeting Pammy Heller and look forward to participating in their care.  A copy of this report was sent to the requesting provider on this date.  Electronically Signed: Henreitta Cea 04/11/2016, 11:05 AM   I spent a total of  30 Minutes   in face to face in clinical consultation, greater than 50% of which was counseling/coordinating care for breast cancer, PAC placement

## 2016-04-11 NOTE — Procedures (Signed)
LEFT  IJ Port cathter placement with US and fluoroscopy No complication No blood loss. See complete dictation in [facility] PACS.  

## 2016-04-11 NOTE — Progress Notes (Signed)
Patient showed RN papers from pregnancy termination on 04/02/16. Has not restarted menstrual cycle yet since then.  Patient in Short Stay for port placement in IR. Had two chicken tenders this am at 0600. Payton Emerald PA made aware of above.

## 2016-04-11 NOTE — Discharge Instructions (Signed)
Implanted Port Home Guide °An implanted port is a type of central line that is placed under the skin. Central lines are used to provide IV access when treatment or nutrition needs to be given through a person's veins. Implanted ports are used for long-term IV access. An implanted port may be placed because:  °· You need IV medicine that would be irritating to the small veins in your hands or arms.   °· You need long-term IV medicines, such as antibiotics.   °· You need IV nutrition for a long period.   °· You need frequent blood draws for lab tests.   °· You need dialysis.   °Implanted ports are usually placed in the chest area, but they can also be placed in the upper arm, the abdomen, or the leg. An implanted port has two main parts:  °· Reservoir. The reservoir is round and will appear as a small, raised area under your skin. The reservoir is the part where a needle is inserted to give medicines or draw blood.   °· Catheter. The catheter is a thin, flexible tube that extends from the reservoir. The catheter is placed into a large vein. Medicine that is inserted into the reservoir goes into the catheter and then into the vein.   °HOW WILL I CARE FOR MY INCISION SITE? °Do not get the incision site wet. Bathe or shower as directed by your health care provider.  °HOW IS MY PORT ACCESSED? °Special steps must be taken to access the port:  °· Before the port is accessed, a numbing cream can be placed on the skin. This helps numb the skin over the port site.   °· Your health care provider uses a sterile technique to access the port. °· Your health care provider must put on a mask and sterile gloves. °· The skin over your port is cleaned carefully with an antiseptic and allowed to dry. °· The port is gently pinched between sterile gloves, and a needle is inserted into the port. °· Only "non-coring" port needles should be used to access the port. Once the port is accessed, a blood return should be checked. This helps  ensure that the port is in the vein and is not clogged.   °· If your port needs to remain accessed for a constant infusion, a clear (transparent) bandage will be placed over the needle site. The bandage and needle will need to be changed every week, or as directed by your health care provider.   °· Keep the bandage covering the needle clean and dry. Do not get it wet. Follow your health care provider's instructions on how to take a shower or bath while the port is accessed.   °· If your port does not need to stay accessed, no bandage is needed over the port.   °WHAT IS FLUSHING? °Flushing helps keep the port from getting clogged. Follow your health care provider's instructions on how and when to flush the port. Ports are usually flushed with saline solution or a medicine called heparin. The need for flushing will depend on how the port is used.  °· If the port is used for intermittent medicines or blood draws, the port will need to be flushed:   °· After medicines have been given.   °· After blood has been drawn.   °· As part of routine maintenance.   °· If a constant infusion is running, the port may not need to be flushed.   °HOW LONG WILL MY PORT STAY IMPLANTED? °The port can stay in for as long as your health care   provider thinks it is needed. When it is time for the port to come out, surgery will be done to remove it. The procedure is similar to the one performed when the port was put in.  °WHEN SHOULD I SEEK IMMEDIATE MEDICAL CARE? °When you have an implanted port, you should seek immediate medical care if:  °· You notice a bad smell coming from the incision site.   °· You have swelling, redness, or drainage at the incision site.   °· You have more swelling or pain at the port site or the surrounding area.   °· You have a fever that is not controlled with medicine. °  °This information is not intended to replace advice given to you by your health care provider. Make sure you discuss any questions you have with  your health care provider. °  °Document Released: 11/21/2005 Document Revised: 09/11/2013 Document Reviewed: 07/29/2013 °[employer] Interactive Patient Education ©2016 [employer] °Implanted Port Insertion, Care After °Refer to this sheet in the next few weeks. These instructions provide you with information on caring for yourself after your procedure. Your health care provider may also give you more specific instructions. Your treatment has been planned according to current medical practices, but problems sometimes occur. Call your health care provider if you have any problems or questions after your procedure. °WHAT TO EXPECT AFTER THE PROCEDURE °After your procedure, it is typical to have the following:  °· Discomfort at the port insertion site. Ice packs to the area will help. °· Bruising on the skin over the port. This will subside in 3-4 days. °HOME CARE INSTRUCTIONS °· After your port is placed, you will get a manufacturer's information card. The card has information about your port. Keep this card with you at all times.   °· Know what kind of port you have. There are many types of ports available.   °· Wear a medical alert bracelet in case of an emergency. This can help alert health care workers that you have a port.   °· The port can stay in for as long as your health care provider believes it is necessary.   °· A home health care nurse may give medicines and take care of the port.   °· You or a family member can get special training and directions for giving medicine and taking care of the port at home.   °SEEK MEDICAL CARE IF:  °· Your port does not flush or you are unable to get a blood return.   °· You have a fever or chills. °SEEK IMMEDIATE MEDICAL CARE IF: °· You have new fluid or pus coming from your incision.   °· You notice a bad smell coming from your incision site.   °· You have swelling, pain, or more redness at the incision or port site.   °· You have chest pain or shortness of breath. °  °This  information is not intended to replace advice given to you by your health care provider. Make sure you discuss any questions you have with your health care provider. °  °Document Released: 09/11/2013 Document Revised: 11/26/2013 Document Reviewed: 09/11/2013 °[employer] Interactive Patient Education ©2016 [employer] °Moderate Conscious Sedation, Adult, Care After °Refer to this sheet in the next few weeks. These instructions provide you with information on caring for yourself after your procedure. Your health care provider may also give you more specific instructions. Your treatment has been planned according to current medical practices, but problems sometimes occur. Call your health care provider if you have any problems or questions after your   procedure. °WHAT TO EXPECT AFTER THE PROCEDURE  °After your procedure: °· You may feel sleepy, clumsy, and have poor balance for several hours. °· Vomiting may occur if you eat too soon after the procedure. °HOME CARE INSTRUCTIONS °· Do not participate in any activities where you could become injured for at least 24 hours. Do not: °¨ Drive. °¨ Swim. °¨ Ride a bicycle. °¨ Operate heavy machinery. °¨ Cook. °¨ Use power tools. °¨ Climb ladders. °¨ Work from a high place. °· Do not make important decisions or sign legal documents until you are improved. °· If you vomit, drink water, juice, or soup when you can drink without vomiting. Make sure you have little or no nausea before eating solid foods. °· Only take over-the-counter or prescription medicines for pain, discomfort, or fever as directed by your health care provider. °· Make sure you and your family fully understand everything about the medicines given to you, including what side effects may occur. °· You should not drink alcohol, take sleeping pills, or take medicines that cause drowsiness for at least 24 hours. °· If you smoke, do not smoke without supervision. °· If you are feeling better, you may resume normal  activities 24 hours after you were sedated. °· Keep all appointments with your health care provider. °SEEK MEDICAL CARE IF: °· Your skin is pale or bluish in color. °· You continue to feel nauseous or vomit. °· Your pain is getting worse and is not helped by medicine. °· You have bleeding or swelling. °· You are still sleepy or feeling clumsy after 24 hours. °SEEK IMMEDIATE MEDICAL CARE IF: °· You develop a rash. °· You have difficulty breathing. °· You develop any type of allergic problem. °· You have a fever. °MAKE SURE YOU: °· Understand these instructions. °· Will watch your condition. °· Will get help right away if you are not doing well or get worse. °  °This information is not intended to replace advice given to you by your health care provider. Make sure you discuss any questions you have with your health care provider. °  °Document Released: 09/11/2013 Document Revised: 12/12/2014 Document Reviewed: 09/11/2013 °[employer] Interactive Patient Education ©2016 [employer] ° °

## 2016-04-14 ENCOUNTER — Other Ambulatory Visit (HOSPITAL_BASED_OUTPATIENT_CLINIC_OR_DEPARTMENT_OTHER): Payer: Medicaid Other

## 2016-04-14 ENCOUNTER — Encounter: Payer: Self-pay | Admitting: *Deleted

## 2016-04-14 ENCOUNTER — Other Ambulatory Visit (HOSPITAL_COMMUNITY)
Admission: RE | Admit: 2016-04-14 | Discharge: 2016-04-14 | Disposition: A | Discharge status: Home / Self Care | Payer: Medicaid Other | Source: Ambulatory Visit | Attending: Oncology | Admitting: Oncology

## 2016-04-14 ENCOUNTER — Ambulatory Visit (HOSPITAL_BASED_OUTPATIENT_CLINIC_OR_DEPARTMENT_OTHER): Payer: Medicaid Other

## 2016-04-14 VITALS — BP 118/66 | HR 85 | Temp 99.1°F | Resp 18

## 2016-04-14 DIAGNOSIS — C50411 Malignant neoplasm of upper-outer quadrant of right female breast: Secondary | ICD-10-CM

## 2016-04-14 DIAGNOSIS — Z5111 Encounter for antineoplastic chemotherapy: Secondary | ICD-10-CM

## 2016-04-14 LAB — CBC WITH DIFFERENTIAL/PLATELET
BASO%: 0.2 % (ref 0.0–2.0)
BASOS ABS: 0 10*3/uL (ref 0.0–0.1)
EOS ABS: 0.3 10*3/uL (ref 0.0–0.5)
EOS%: 2.6 % (ref 0.0–7.0)
HCT: 32.8 % — ABNORMAL LOW (ref 34.8–46.6)
HEMOGLOBIN: 11.3 g/dL — AB (ref 11.6–15.9)
LYMPH%: 22.2 % (ref 14.0–49.7)
MCH: 30.5 pg (ref 25.1–34.0)
MCHC: 34.5 g/dL (ref 31.5–36.0)
MCV: 88.4 fL (ref 79.5–101.0)
MONO#: 0.8 10*3/uL (ref 0.1–0.9)
MONO%: 6.6 % (ref 0.0–14.0)
NEUT%: 68.4 % (ref 38.4–76.8)
NEUTROS ABS: 8.4 10*3/uL — AB (ref 1.5–6.5)
PLATELETS: 213 10*3/uL (ref 145–400)
RBC: 3.71 10*6/uL (ref 3.70–5.45)
RDW: 13.6 % (ref 11.2–14.5)
WBC: 12.3 10*3/uL — AB (ref 3.9–10.3)
lymph#: 2.7 10*3/uL (ref 0.9–3.3)

## 2016-04-14 LAB — COMPREHENSIVE METABOLIC PANEL
ALBUMIN: 3.8 g/dL (ref 3.5–5.0)
ALK PHOS: 50 U/L (ref 40–150)
ALT: <9 U/L (ref 0–55)
ANION GAP: 7 meq/L (ref 3–11)
AST: 11 U/L (ref 5–34)
BILIRUBIN TOTAL: 0.4 mg/dL (ref 0.20–1.20)
BUN: 9.8 mg/dL (ref 7.0–26.0)
CO2: 27 mEq/L (ref 22–29)
Calcium: 9.3 mg/dL (ref 8.4–10.4)
Chloride: 109 mEq/L (ref 98–109)
Creatinine: 1 mg/dL (ref 0.6–1.1)
EGFR: 79 mL/min/{1.73_m2} — AB (ref >90)
Glucose: 87 mg/dl (ref 70–140)
Potassium: 3.8 mEq/L (ref 3.5–5.1)
Sodium: 143 mEq/L (ref 136–145)
TOTAL PROTEIN: 7.4 g/dL (ref 6.4–8.3)

## 2016-04-14 LAB — PREGNANCY, URINE: PREG TEST UR: POSITIVE — AB

## 2016-04-14 MED ORDER — HEPARIN SOD (PORK) LOCK FLUSH 100 UNIT/ML IV SOLN
500.0000 [IU] | Freq: Once | INTRAVENOUS | Status: AC | PRN
  Administered 2016-04-14: 500 [IU]
  Filled 2016-04-14: qty 5

## 2016-04-14 MED ORDER — DOXORUBICIN HCL CHEMO IV INJECTION 2 MG/ML
60.0000 mg/m2 | Freq: Once | INTRAVENOUS | Status: AC
  Administered 2016-04-14: 114 mg via INTRAVENOUS
  Filled 2016-04-14: qty 57

## 2016-04-14 MED ORDER — SODIUM CHLORIDE 0.9 % IV SOLN
Freq: Once | INTRAVENOUS | Status: AC
  Administered 2016-04-14: 14:00:00 via INTRAVENOUS

## 2016-04-14 MED ORDER — SODIUM CHLORIDE 0.9% FLUSH
10.0000 mL | INTRAVENOUS | Status: DC | PRN
  Administered 2016-04-14: 10 mL
  Filled 2016-04-14: qty 10

## 2016-04-14 MED ORDER — PALONOSETRON HCL INJECTION 0.25 MG/5ML
0.2500 mg | Freq: Once | INTRAVENOUS | Status: AC
  Administered 2016-04-14: 0.25 mg via INTRAVENOUS

## 2016-04-14 MED ORDER — PEGFILGRASTIM 6 MG/0.6ML ~~LOC~~ PSKT
6.0000 mg | PREFILLED_SYRINGE | Freq: Once | SUBCUTANEOUS | Status: AC
  Administered 2016-04-14: 6 mg via SUBCUTANEOUS
  Filled 2016-04-14: qty 0.6

## 2016-04-14 MED ORDER — SODIUM CHLORIDE 0.9 % IV SOLN
Freq: Once | INTRAVENOUS | Status: AC
  Administered 2016-04-14: 14:00:00 via INTRAVENOUS
  Filled 2016-04-14: qty 5

## 2016-04-14 MED ORDER — SODIUM CHLORIDE 0.9 % IV SOLN
600.0000 mg/m2 | Freq: Once | INTRAVENOUS | Status: AC
  Administered 2016-04-14: 1140 mg via INTRAVENOUS
  Filled 2016-04-14: qty 57

## 2016-04-14 MED ORDER — PALONOSETRON HCL INJECTION 0.25 MG/5ML
INTRAVENOUS | Status: AC
  Filled 2016-04-14: qty 5

## 2016-04-14 NOTE — Progress Notes (Signed)
Approval to treat per Dr. [name]. Received documentation from [facility] noting termination of pregnancy on 04/01/16.

## 2016-04-14 NOTE — Patient Instructions (Addendum)
[facility] Discharge Instructions for Patients Receiving Chemotherapy  Today you received the following chemotherapy agents:  Cytoxan, Adriamycin  To help prevent nausea and vomiting after your treatment, we encourage you to take your nausea medication as prescribed.   If you develop nausea and vomiting that is not controlled by your nausea medication, call the clinic.   BELOW ARE SYMPTOMS THAT SHOULD BE REPORTED IMMEDIATELY:  *FEVER GREATER THAN 100.5 F  *CHILLS WITH OR WITHOUT FEVER  NAUSEA AND VOMITING THAT IS NOT CONTROLLED WITH YOUR NAUSEA MEDICATION  *UNUSUAL SHORTNESS OF BREATH  *UNUSUAL BRUISING OR BLEEDING  TENDERNESS IN MOUTH AND THROAT WITH OR WITHOUT PRESENCE OF ULCERS  *URINARY PROBLEMS  *BOWEL PROBLEMS  UNUSUAL RASH Items with * indicate a potential emergency and should be followed up as soon as possible.  Feel free to call the clinic you have any questions or concerns. The clinic phone number is [phone].  Please show the [facility] at check-in to the Emergency Department and triage nurse.    Cyclophosphamide injection What is this medicine? CYCLOPHOSPHAMIDE (sye kloe FOSS fa mide) is a chemotherapy drug. It slows the growth of cancer cells. This medicine is used to treat many types of cancer like lymphoma, myeloma, leukemia, breast cancer, and ovarian cancer, to name a few. This medicine may be used for other purposes; ask your health care provider or pharmacist if you have questions. What should I tell my health care provider before I take this medicine? They need to know if you have any of these conditions: -blood disorders -history of other chemotherapy -infection -kidney disease -liver disease -recent or ongoing radiation therapy -tumors in the bone marrow -an unusual or allergic reaction to cyclophosphamide, other chemotherapy, other medicines, foods, dyes, or preservatives -pregnant or trying to get  pregnant -breast-feeding How should I use this medicine? This drug is usually given as an injection into a vein or muscle or by infusion into a vein. It is administered in a hospital or clinic by a specially trained health care professional. Talk to your pediatrician regarding the use of this medicine in children. Special care may be needed. Overdosage: If you think you have taken too much of this medicine contact a poison control center or emergency room at once. NOTE: This medicine is only for you. Do not share this medicine with others. What if I miss a dose? It is important not to miss your dose. Call your doctor or health care professional if you are unable to keep an appointment. What may interact with this medicine? This medicine may interact with the following medications: -amiodarone -amphotericin B -azathioprine -certain antiviral medicines for HIV or AIDS such as protease inhibitors (e.g., indinavir, ritonavir) and zidovudine -certain blood pressure medications such as benazepril, captopril, enalapril, fosinopril, lisinopril, moexipril, monopril, perindopril, quinapril, ramipril, trandolapril -certain cancer medications such as anthracyclines (e.g., daunorubicin, doxorubicin), busulfan, cytarabine, paclitaxel, pentostatin, tamoxifen, trastuzumab -certain diuretics such as chlorothiazide, chlorthalidone, hydrochlorothiazide, indapamide, metolazone -certain medicines that treat or prevent blood clots like warfarin -certain muscle relaxants such as succinylcholine -cyclosporine -etanercept -indomethacin -medicines to increase blood counts like filgrastim, pegfilgrastim, sargramostim -medicines used as general anesthesia -metronidazole -natalizumab This list may not describe all possible interactions. Give your health care provider a list of all the medicines, herbs, non-prescription drugs, or dietary supplements you use. Also tell them if you smoke, drink alcohol, or use illegal  drugs. Some items may interact with your medicine. What should I watch for while using this  medicine? Visit your doctor for checks on your progress. This drug may make you feel generally unwell. This is not uncommon, as chemotherapy can affect healthy cells as well as cancer cells. Report any side effects. Continue your course of treatment even though you feel ill unless your doctor tells you to stop. Drink water or other fluids as directed. Urinate often, even at night. In some cases, you may be given additional medicines to help with side effects. Follow all directions for their use. Call your doctor or health care professional for advice if you get a fever, chills or sore throat, or other symptoms of a cold or flu. Do not treat yourself. This drug decreases your body's ability to fight infections. Try to avoid being around people who are sick. This medicine may increase your risk to bruise or bleed. Call your doctor or health care professional if you notice any unusual bleeding. Be careful brushing and flossing your teeth or using a toothpick because you may get an infection or bleed more easily. If you have any dental work done, tell your dentist you are receiving this medicine. You may get drowsy or dizzy. Do not drive, use machinery, or do anything that needs mental alertness until you know how this medicine affects you. Do not become pregnant while taking this medicine or for 1 year after stopping it. Women should inform their doctor if they wish to become pregnant or think they might be pregnant. Men should not father a child while taking this medicine and for 4 months after stopping it. There is a potential for serious side effects to an unborn child. Talk to your health care professional or pharmacist for more information. Do not breast-feed an infant while taking this medicine. This medicine may interfere with the ability to have a child. This medicine has caused ovarian failure in some women.  This medicine has caused reduced sperm counts in some men. You should talk with your doctor or health care professional if you are concerned about your fertility. If you are going to have surgery, tell your doctor or health care professional that you have taken this medicine. What side effects may I notice from receiving this medicine? Side effects that you should report to your doctor or health care professional as soon as possible: -allergic reactions like skin rash, itching or hives, swelling of the face, lips, or tongue -low blood counts - this medicine may decrease the number of white blood cells, red blood cells and platelets. You may be at increased risk for infections and bleeding. -signs of infection - fever or chills, cough, sore throat, pain or difficulty passing urine -signs of decreased platelets or bleeding - bruising, pinpoint red spots on the skin, black, tarry stools, blood in the urine -signs of decreased red blood cells - unusually weak or tired, fainting spells, lightheadedness -breathing problems -dark urine -dizziness -palpitations -swelling of the ankles, feet, hands -trouble passing urine or change in the amount of urine -weight gain -yellowing of the eyes or skin Side effects that usually do not require medical attention (report to your doctor or health care professional if they continue or are bothersome): -changes in nail or skin color -hair loss -missed menstrual periods -mouth sores -nausea, vomiting This list may not describe all possible side effects. Call your doctor for medical advice about side effects. You may report side effects to FDA at 1-800-FDA-1088. Where should I keep my medicine? This drug is given in a hospital or clinic and will  not be stored at home. NOTE: This sheet is a summary. It may not cover all possible information. If you have questions about this medicine, talk to your doctor, pharmacist, or health care provider.    2016,  Elsevier/Gold Standard. (2012-10-05 16:22:58)    Doxorubicin injection What is this medicine? DOXORUBICIN (dox oh ROO bi sin) is a chemotherapy drug. It is used to treat many kinds of cancer like Hodgkin's disease, leukemia, non-Hodgkin's lymphoma, neuroblastoma, sarcoma, and Wilms' tumor. It is also used to treat bladder cancer, breast cancer, lung cancer, ovarian cancer, stomach cancer, and thyroid cancer. This medicine may be used for other purposes; ask your health care provider or pharmacist if you have questions. What should I tell my health care provider before I take this medicine? They need to know if you have any of these conditions: -blood disorders -heart disease, recent heart attack -infection (especially a virus infection such as chickenpox, cold sores, or herpes) -irregular heartbeat -liver disease -recent or ongoing radiation therapy -an unusual or allergic reaction to doxorubicin, other chemotherapy agents, other medicines, foods, dyes, or preservatives -pregnant or trying to get pregnant -breast-feeding How should I use this medicine? This drug is given as an infusion into a vein. It is administered in a hospital or clinic by a specially trained health care professional. If you have pain, swelling, burning or any unusual feeling around the site of your injection, tell your health care professional right away. Talk to your pediatrician regarding the use of this medicine in children. Special care may be needed. Overdosage: If you think you have taken too much of this medicine contact a poison control center or emergency room at once. NOTE: This medicine is only for you. Do not share this medicine with others. What if I miss a dose? It is important not to miss your dose. Call your doctor or health care professional if you are unable to keep an appointment. What may interact with this medicine? Do not take this medicine with any of the following  medications: -cisapride -droperidol -halofantrine -pimozide -zidovudine This medicine may also interact with the following medications: -chloroquine -chlorpromazine -clarithromycin -cyclophosphamide -cyclosporine -erythromycin -medicines for depression, anxiety, or psychotic disturbances -medicines for irregular heart beat like amiodarone, bepridil, dofetilide, encainide, flecainide, propafenone, quinidine -medicines for seizures like ethotoin, fosphenytoin, phenytoin -medicines for nausea, vomiting like dolasetron, ondansetron, palonosetron -medicines to increase blood counts like filgrastim, pegfilgrastim, sargramostim -methadone -methotrexate -pentamidine -progesterone -vaccines -verapamil Talk to your doctor or health care professional before taking any of these medicines: -acetaminophen -aspirin -ibuprofen -ketoprofen -naproxen This list may not describe all possible interactions. Give your health care provider a list of all the medicines, herbs, non-prescription drugs, or dietary supplements you use. Also tell them if you smoke, drink alcohol, or use illegal drugs. Some items may interact with your medicine. What should I watch for while using this medicine? Your condition will be monitored carefully while you are receiving this medicine. You will need important blood work done while you are taking this medicine. This drug may make you feel generally unwell. This is not uncommon, as chemotherapy can affect healthy cells as well as cancer cells. Report any side effects. Continue your course of treatment even though you feel ill unless your doctor tells you to stop. Your urine may turn red for a few days after your dose. This is not blood. If your urine is dark or brown, call your doctor. In some cases, you may be given additional medicines to help with  side effects. Follow all directions for their use. Call your doctor or health care professional for advice if you get a  fever, chills or sore throat, or other symptoms of a cold or flu. Do not treat yourself. This drug decreases your body's ability to fight infections. Try to avoid being around people who are sick. This medicine may increase your risk to bruise or bleed. Call your doctor or health care professional if you notice any unusual bleeding. Be careful brushing and flossing your teeth or using a toothpick because you may get an infection or bleed more easily. If you have any dental work done, tell your dentist you are receiving this medicine. Avoid taking products that contain aspirin, acetaminophen, ibuprofen, naproxen, or ketoprofen unless instructed by your doctor. These medicines may hide a fever. Men and women of childbearing age should use effective birth control methods while using taking this medicine. Do not become pregnant while taking this medicine. There is a potential for serious side effects to an unborn child. Talk to your health care professional or pharmacist for more information. Do not breast-feed an infant while taking this medicine. Do not let others touch your urine or other body fluids for 5 days after each treatment with this medicine. Caregivers should wear latex gloves to avoid touching body fluids during this time. There is a maximum amount of this medicine you should receive throughout your life. The amount depends on the medical condition being treated and your overall health. Your doctor will watch how much of this medicine you receive in your lifetime. Tell your doctor if you have taken this medicine before. What side effects may I notice from receiving this medicine? Side effects that you should report to your doctor or health care professional as soon as possible: -allergic reactions like skin rash, itching or hives, swelling of the face, lips, or tongue -low blood counts - this medicine may decrease the number of white blood cells, red blood cells and platelets. You may be at  increased risk for infections and bleeding. -signs of infection - fever or chills, cough, sore throat, pain or difficulty passing urine -signs of decreased platelets or bleeding - bruising, pinpoint red spots on the skin, black, tarry stools, blood in the urine -signs of decreased red blood cells - unusually weak or tired, fainting spells, lightheadedness -breathing problems -chest pain -fast, irregular heartbeat -mouth sores -nausea, vomiting -pain, swelling, redness at site where injected -pain, tingling, numbness in the hands or feet -swelling of ankles, feet, or hands -unusual bleeding or bruising Side effects that usually do not require medical attention (report to your doctor or health care professional if they continue or are bothersome): -diarrhea -facial flushing -hair loss -loss of appetite -missed menstrual periods -nail discoloration or damage -red or watery eyes -red colored urine -stomach upset This list may not describe all possible side effects. Call your doctor for medical advice about side effects. You may report side effects to FDA at 1-800-FDA-1088. Where should I keep my medicine? This drug is given in a hospital or clinic and will not be stored at home. NOTE: This sheet is a summary. It may not cover all possible information. If you have questions about this medicine, talk to your doctor, pharmacist, or health care provider.    2016, Elsevier/Gold Standard. (2013-03-19 09:54:34)  Pegfilgrastim injection What is this medicine? PEGFILGRASTIM (PEG fil gra stim) is a long-acting granulocyte colony-stimulating factor that stimulates the growth of neutrophils, a type of white blood cell  important in the body's fight against infection. It is used to reduce the incidence of fever and infection in patients with certain types of cancer who are receiving chemotherapy that affects the bone marrow, and to increase survival after being exposed to high doses of  radiation. This medicine may be used for other purposes; ask your health care provider or pharmacist if you have questions. What should I tell my health care provider before I take this medicine? They need to know if you have any of these conditions: -kidney disease -latex allergy -ongoing radiation therapy -sickle cell disease -skin reactions to acrylic adhesives (On-Body Injector only) -an unusual or allergic reaction to pegfilgrastim, filgrastim, other medicines, foods, dyes, or preservatives -pregnant or trying to get pregnant -breast-feeding How should I use this medicine? This medicine is for injection under the skin. If you get this medicine at home, you will be taught how to prepare and give the pre-filled syringe or how to use the On-body Injector. Refer to the patient Instructions for Use for detailed instructions. Use exactly as directed. Take your medicine at regular intervals. Do not take your medicine more often than directed. It is important that you put your used needles and syringes in a special sharps container. Do not put them in a trash can. If you do not have a sharps container, call your pharmacist or healthcare provider to get one. Talk to your pediatrician regarding the use of this medicine in children. While this drug may be prescribed for selected conditions, precautions do apply. Overdosage: If you think you have taken too much of this medicine contact a poison control center or emergency room at once. NOTE: This medicine is only for you. Do not share this medicine with others. What if I miss a dose? It is important not to miss your dose. Call your doctor or health care professional if you miss your dose. If you miss a dose due to an On-body Injector failure or leakage, a new dose should be administered as soon as possible using a single prefilled syringe for manual use. What may interact with this medicine? Interactions have not been studied. Give your health care  provider a list of all the medicines, herbs, non-prescription drugs, or dietary supplements you use. Also tell them if you smoke, drink alcohol, or use illegal drugs. Some items may interact with your medicine. This list may not describe all possible interactions. Give your health care provider a list of all the medicines, herbs, non-prescription drugs, or dietary supplements you use. Also tell them if you smoke, drink alcohol, or use illegal drugs. Some items may interact with your medicine. What should I watch for while using this medicine? You may need blood work done while you are taking this medicine. If you are going to need a MRI, CT scan, or other procedure, tell your doctor that you are using this medicine (On-Body Injector only). What side effects may I notice from receiving this medicine? Side effects that you should report to your doctor or health care professional as soon as possible: -allergic reactions like skin rash, itching or hives, swelling of the face, lips, or tongue -dizziness -fever -pain, redness, or irritation at site where injected -pinpoint red spots on the skin -red or dark-brown urine -shortness of breath or breathing problems -stomach or side pain, or pain at the shoulder -swelling -tiredness -trouble passing urine or change in the amount of urine Side effects that usually do not require medical attention (report to your doctor  or health care professional if they continue or are bothersome): -bone pain -muscle pain This list may not describe all possible side effects. Call your doctor for medical advice about side effects. You may report side effects to FDA at 1-800-FDA-1088. Where should I keep my medicine? Keep out of the reach of children. Store pre-filled syringes in a refrigerator between 2 and 8 degrees C (36 and 46 degrees F). Do not freeze. Keep in carton to protect from light. Throw away this medicine if it is left out of the refrigerator for more than  48 hours. Throw away any unused medicine after the expiration date. NOTE: This sheet is a summary. It may not cover all possible information. If you have questions about this medicine, talk to your doctor, pharmacist, or health care provider.    2016, Elsevier/Gold Standard. (2014-12-11 14:30:14)

## 2016-04-14 NOTE — Progress Notes (Signed)
Per Dr [name], [place] to start treatment with temp of 99.1 1539:  Pt c/o burning sinuses.  Rate decreased to 400mg /hr.  Pt tolerated well.

## 2016-04-14 NOTE — Progress Notes (Signed)
[facility]  Clinical Social Work checked in with pt while she was getting her first treatment in the infusion room. Pt and CSW reviewed current concerns/stressors. Pt continues to have financial concerns and was encouraged to meet with Lenise, financial advocate today as well. Pt reports her kids are doing ok and she reports to be coping ok currently. Pt expressed on going food insecurity and CSW assisted with this today. Pt denied other needs and CSW will check in next week. Pt aware how to reach out if needs arise prior to next week.    Clinical Social Work interventions: Programme researcher, broadcasting/film/video education and assistance  Loren Racer, [place] Worker [place]  [place] Phone: [phone] Fax: [phone]

## 2016-04-15 ENCOUNTER — Telehealth: Payer: Self-pay | Admitting: *Deleted

## 2016-04-15 ENCOUNTER — Inpatient Hospital Stay: Admission: RE | Admit: 2016-04-15 | Payer: Medicaid Other | Source: Ambulatory Visit

## 2016-04-15 NOTE — Telephone Encounter (Signed)
"  My babysitter can't come anymore to watch my kids so I cannot keep today's Breast Imaging appointment.  I need early morning appointments because my child has special needs."   Provided [facility] Imaging number [phone] for her to call to reschedule suggesting she request Monday, Tuesday, or Wednesday to have this done before F/U on Thursday 04-21-2016.  Observed appointment has been moved to 04-26-2016 at 10:30 am

## 2016-04-15 NOTE — Telephone Encounter (Signed)
This RN spoke with pt for chemo follow up. Rebecca [name] states she is overall " feeling good, had one episode of nausea but am taking my medication and am good ".  Rebecca [name] states she is able to hydrate well.  No other concerns at this time.

## 2016-04-18 NOTE — Telephone Encounter (Signed)
This note will be forwarded to Breast Navigator for appropriate coordination of appointments.

## 2016-04-21 ENCOUNTER — Telehealth: Payer: Self-pay | Admitting: Nurse Practitioner

## 2016-04-21 ENCOUNTER — Other Ambulatory Visit (HOSPITAL_BASED_OUTPATIENT_CLINIC_OR_DEPARTMENT_OTHER): Payer: Medicaid Other

## 2016-04-21 ENCOUNTER — Encounter: Payer: Self-pay | Admitting: Nurse Practitioner

## 2016-04-21 ENCOUNTER — Ambulatory Visit (HOSPITAL_BASED_OUTPATIENT_CLINIC_OR_DEPARTMENT_OTHER): Payer: Medicaid Other | Admitting: Nurse Practitioner

## 2016-04-21 ENCOUNTER — Encounter: Payer: Self-pay | Admitting: *Deleted

## 2016-04-21 VITALS — BP 112/78 | HR 90 | Temp 98.5°F | Resp 19 | Ht 61.5 in | Wt 178.0 lb

## 2016-04-21 DIAGNOSIS — C50411 Malignant neoplasm of upper-outer quadrant of right female breast: Secondary | ICD-10-CM

## 2016-04-21 DIAGNOSIS — R12 Heartburn: Secondary | ICD-10-CM

## 2016-04-21 DIAGNOSIS — K219 Gastro-esophageal reflux disease without esophagitis: Secondary | ICD-10-CM | POA: Diagnosis not present

## 2016-04-21 DIAGNOSIS — R197 Diarrhea, unspecified: Secondary | ICD-10-CM | POA: Diagnosis not present

## 2016-04-21 DIAGNOSIS — Z720 Tobacco use: Secondary | ICD-10-CM | POA: Diagnosis not present

## 2016-04-21 DIAGNOSIS — Z171 Estrogen receptor negative status [ER-]: Secondary | ICD-10-CM

## 2016-04-21 DIAGNOSIS — M898X9 Other specified disorders of bone, unspecified site: Secondary | ICD-10-CM

## 2016-04-21 DIAGNOSIS — R634 Abnormal weight loss: Secondary | ICD-10-CM

## 2016-04-21 LAB — CBC WITH DIFFERENTIAL/PLATELET
BASO%: 0.7 % (ref 0.0–2.0)
BASOS ABS: 0 10*3/uL (ref 0.0–0.1)
EOS%: 5.2 % (ref 0.0–7.0)
Eosinophils Absolute: 0.2 10*3/uL (ref 0.0–0.5)
HEMATOCRIT: 32.3 % — AB (ref 34.8–46.6)
HGB: 11.3 g/dL — ABNORMAL LOW (ref 11.6–15.9)
LYMPH%: 34.9 % (ref 14.0–49.7)
MCH: 30.5 pg (ref 25.1–34.0)
MCHC: 35 g/dL (ref 31.5–36.0)
MCV: 87.3 fL (ref 79.5–101.0)
MONO#: 0.2 10*3/uL (ref 0.1–0.9)
MONO%: 3.3 % (ref 0.0–14.0)
NEUT#: 2.6 10*3/uL (ref 1.5–6.5)
NEUT%: 55.9 % (ref 38.4–76.8)
Platelets: 117 10*3/uL — ABNORMAL LOW (ref 145–400)
RBC: 3.7 10*6/uL (ref 3.70–5.45)
RDW: 13.1 % (ref 11.2–14.5)
WBC: 4.6 10*3/uL (ref 3.9–10.3)
lymph#: 1.6 10*3/uL (ref 0.9–3.3)

## 2016-04-21 LAB — COMPREHENSIVE METABOLIC PANEL
ALT: <9 U/L (ref 0–55)
AST: 9 U/L (ref 5–34)
Albumin: 3.9 g/dL (ref 3.5–5.0)
Alkaline Phosphatase: 79 U/L (ref 40–150)
Anion Gap: 7 mEq/L (ref 3–11)
BUN: 7.3 mg/dL (ref 7.0–26.0)
CALCIUM: 9.9 mg/dL (ref 8.4–10.4)
CHLORIDE: 105 meq/L (ref 98–109)
CO2: 28 mEq/L (ref 22–29)
Creatinine: 0.8 mg/dL (ref 0.6–1.1)
EGFR: >90 mL/min/{1.73_m2} (ref >90)
Glucose: 95 mg/dl (ref 70–140)
POTASSIUM: 3.3 meq/L — AB (ref 3.5–5.1)
Sodium: 140 mEq/L (ref 136–145)
Total Bilirubin: 0.42 mg/dL (ref 0.20–1.20)
Total Protein: 7.6 g/dL (ref 6.4–8.3)

## 2016-04-21 MED ORDER — CHOLESTYRAMINE 4 G PO PACK: 4.0000 g | PACK | Freq: Three times a day (TID) | ORAL | Status: DC

## 2016-04-21 MED ORDER — OMEPRAZOLE 20 MG PO CPDR: 20.0000 mg | DELAYED_RELEASE_CAPSULE | Freq: Every day | ORAL | Status: DC

## 2016-04-21 NOTE — Progress Notes (Signed)
[place]  [phone] [phone]   ID: Rebecca [name] DOB: 04/19/1976  MR#: [id number]  OEH#:[id number]  Patient Care Team: Dorena Dew, FNP as PCP - General (Family Medicine) Alphonsa Overall, MD as Consulting Physician (General Surgery) Chauncey Cruel, MD as Consulting Physician (Oncology) Gery Pray, MD as Consulting Physician (Radiation Oncology) Sylvan Cheese, NP as Nurse Practitioner (Hematology and Oncology) PCP: Dorena Dew, FNP GYN: OTHER MD:  CHIEF COMPLAINT: triple negative breast cancer  [name] TREATMENT: neoadjuvant chemotherapy  BREAST CANCER HISTORY: From the original intake note:  Rebecca [name] herself noted a change in her right breast sometime around September or October 2016. She did not bring it to intermediate medical attention, but on 01/13/2016 she established herself in Dr. Smith Robert' service and she was set up for bilateral diagnostic mammography with tomosynthesis and bilateral ultrasonography at the [facility] 01/19/2016. The breast density was category B. In the upper outer quadrant of the right breast there was a spiculated mass measuring 2.8 cm. On physical exam this was palpable. Targeted ultrasonography confirmed an irregular hypoechoic mass in the right breast 11:30 o'clock position measuring 2.6 cm maximally. Ultrasound of the right axilla showed a morphologically abnormal lymph node.  In the left breast there were some tubular densities behind the areola which by ultrasonography showed benign ductal ectasia.  On 01/28/2016 Rebecca [name] underwent biopsy of the right breast mass and abnormal right axillary lymph node. The pathology from this procedure (S [id number]) showed the lymph node to be benign. In the breast however there was an invasive ductal carcinoma, grade 3, which was estrogen and progesterone receptor negative. The proliferation marker was 70%. HER-2 was not amplified with a signals ratio of 1.32. The  number per cell was 2.05.  The patient's subsequent history is as detailed below  INTERVAL HISTORY: Rebecca [name] returns today for further discussion of her breast cancer treatment, alone. Today is day 8, cycle 1 of doxorubicin and cyclophosphamide, with neulasta onpro for granulocyte support.  REVIEW OF SYSTEMS: Rebecca [name] denies fevers or chills. Her nausea has kept her from eating the past few days and she has vomited at least twice. She feels like the antiemetics prescribed to her only make her nausea worse. This has improved over the past 2 days. She is starting to eat a little bit more. She has lost 10lbs in the past 3 weeks. She is trying to stay hydrated, but she has several episodes of diarrhea daily. She experienced some mild heart burn. She denies mouth sores, rashes, or neuropathy symptoms. She complains of moderate bone pain despite claritin use. A detailed review of systems is otherwise stable.  PAST MEDICAL HISTORY: Past Medical History  Diagnosis Date  . Breast cancer (Rebecca [name])   . Depression   . Anxiety   . Sickle cell trait ([place])   . Obesity (BMI 35.0-39.9 without comorbidity) ([place])   . Hypertension     pt states is currently on no medications   . Seasonal allergies   . GERD (gastroesophageal reflux disease)   . Termination of pregnancy (fetus) 04/02/16    PAST SURGICAL HISTORY: Past Surgical History  Procedure Laterality Date  . Cesarean section      2004 and 2007    FAMILY HISTORY Family History  Problem Relation Age of Onset  . Hypertension Mother   . Uterine cancer Mother   . Cancer Father   . Hypertension Father   . Breast cancer Maternal Aunt   . Prostate cancer Maternal Uncle   .  Breast cancer Maternal Grandmother   . Throat cancer Maternal Grandfather   . Breast cancer Cousin   The patient has very little information about her father. Her mother is currently 51 years old. She had a history of cervical cancer at age 54. The patient had 2 brothers, no sisters. The  maternal grandfather had throat cancer. A maternal uncle was diagnosed with breast cancer as well as prostate cancer at the age of 1. 2 maternal cousins, one of the mail, had breast cancer as well.  GYNECOLOGIC HISTORY:  No LMP recorded (lmp unknown). Menarche age 40, first live birth age 30. The patient is GX P4. She still having regular periods. She took oral contraceptives in the 1990s with no side effects.  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 The patient's significant other Rebecca [name] works at [employer]. In addition to them the patient's 3 children Rebecca [name] Rebecca [name], Rebecca [name] and Rebecca [name] are also in the home. There are age 69, 71, and 40. The patient's son Rebecca [name], currently 18 years old, lives in [name].    ADVANCED DIRECTIVES: Not in place   HEALTH MAINTENANCE: Social History  Substance Use Topics  . Smoking status: [name] Every Day Smoker -- 0.25 packs/day for 20 years    Types: Cigarettes  . Smokeless tobacco: Never Used  . Alcohol Use: Yes     Comment: occ     Colonoscopy:  PAP:  Bone density:  Lipid panel:  No Known Allergies  [name] Outpatient Prescriptions  Medication Sig Dispense Refill  . dexamethasone (DECADRON) 4 MG tablet Take 2 tablets by mouth once a day on the day after chemotherapy and then take 2 tablets two times a day for 2 days. Take with food. 30 tablet 1  . lidocaine-prilocaine (EMLA) cream Apply to affected area once 30 g 3  . LORazepam (ATIVAN) 0.5 MG tablet Take 1 tablet (0.5 mg total) by mouth at bedtime. 30 tablet 0  . ondansetron (ZOFRAN) 8 MG tablet Take 1 tablet (8 mg total) by mouth 2 (two) times daily as needed. Start on the third day after chemotherapy. 30 tablet 1  . prochlorperazine (COMPAZINE) 10 MG tablet Take 1 tablet (10 mg total) by mouth every 6 (six) hours as needed (Nausea or vomiting). 30 tablet 1  . OVER THE COUNTER MEDICATION  Take 1 Dose by mouth every other day. Reported on 04/21/2016     No [name] facility-administered medications for this visit.    OBJECTIVE: [name] Who appears stated age 40 Vitals:   04/21/16 1032  BP: 112/78  Pulse: 90  Temp: 98.5 F (36.9 C)  Resp: 19     Body mass index is 33.09 kg/(m^2).    ECOG FS:1 - Symptomatic but completely ambulatory  Sclerae unicteric, pupils round and equal Oropharynx clear and moist-- no thrush or other lesions No cervical or supraclavicular adenopathy Lungs no rales or rhonchi Heart regular rate and rhythm Abd soft, nontender, positive bowel sounds MSK no focal spinal tenderness, no upper extremity lymphedema Neuro: nonfocal, well oriented, appropriate affect Breasts: deferred  LAB RESULTS:  CMP     Component Value Date/Time   NA 143 04/14/2016 1025   NA 137 04/11/2016 1045   K 3.8 04/14/2016 1025   K 3.4* 04/11/2016 1045   CL 106 04/11/2016 1045   CO2 27 04/14/2016 1025   CO2 25 04/11/2016 1045  GLUCOSE 87 04/14/2016 1025   GLUCOSE 84 04/11/2016 1045   BUN 9.8 04/14/2016 1025   BUN 5* 04/11/2016 1045   CREATININE 1.0 04/14/2016 1025   CREATININE 0.78 04/11/2016 1045   CREATININE 0.75 01/12/2016 1115   CALCIUM 9.3 04/14/2016 1025   CALCIUM 8.6* 04/11/2016 1045   PROT 7.4 04/14/2016 1025   PROT 7.1 01/12/2016 1115   ALBUMIN 3.8 04/14/2016 1025   ALBUMIN 4.2 01/12/2016 1115   AST 11 04/14/2016 1025   AST 12 01/12/2016 1115   ALT <9 04/14/2016 1025   ALT 8 01/12/2016 1115   ALKPHOS 50 04/14/2016 1025   ALKPHOS 47 01/12/2016 1115   BILITOT 0.40 04/14/2016 1025   BILITOT 0.4 01/12/2016 1115   GFRNONAA >60 04/11/2016 1045   GFRNONAA >89 01/12/2016 1115   GFRAA >60 04/11/2016 1045   GFRAA >89 01/12/2016 1115    INo results found for: SPEP, UPEP  Lab Results  Component Value Date   WBC 4.6 04/21/2016   NEUTROABS 2.6 04/21/2016   HGB 11.3* 04/21/2016   HCT 32.3* 04/21/2016   MCV 87.3 04/21/2016    PLT 117* 04/21/2016      Chemistry      Component Value Date/Time   NA 143 04/14/2016 1025   NA 137 04/11/2016 1045   K 3.8 04/14/2016 1025   K 3.4* 04/11/2016 1045   CL 106 04/11/2016 1045   CO2 27 04/14/2016 1025   CO2 25 04/11/2016 1045   BUN 9.8 04/14/2016 1025   BUN 5* 04/11/2016 1045   CREATININE 1.0 04/14/2016 1025   CREATININE 0.78 04/11/2016 1045   CREATININE 0.75 01/12/2016 1115      Component Value Date/Time   CALCIUM 9.3 04/14/2016 1025   CALCIUM 8.6* 04/11/2016 1045   ALKPHOS 50 04/14/2016 1025   ALKPHOS 47 01/12/2016 1115   AST 11 04/14/2016 1025   AST 12 01/12/2016 1115   ALT <9 04/14/2016 1025   ALT 8 01/12/2016 1115   BILITOT 0.40 04/14/2016 1025   BILITOT 0.4 01/12/2016 1115       No results found for: LABCA2  No components found for: LABCA125  No results for input(s): INR in the last 168 hours.  Urinalysis    Component Value Date/Time   LABSPEC 1.020 01/12/2016 1122   PHURINE 5.5 01/12/2016 1122   GLUCOSEU NEGATIVE 01/12/2016 1122   HGBUR TRACE* 01/12/2016 1122   BILIRUBINUR NEGATIVE 01/12/2016 1122   KETONESUR NEGATIVE 01/12/2016 1122   PROTEINUR NEGATIVE 01/12/2016 1122   UROBILINOGEN 0.2 01/12/2016 1122   NITRITE NEGATIVE 01/12/2016 1122   LEUKOCYTESUR NEGATIVE 01/12/2016 1122    ELIGIBLE FOR AVAILABLE RESEARCH PROTOCOL: PREVENT?  STUDIES: Ir Fluoro Guide Cv Line Left  04/11/2016  CLINICAL DATA:  Breast carcinoma, needs venous access for chemotherapy EXAM: TUNNELED PORT CATHETER PLACEMENT WITH ULTRASOUND AND FLUOROSCOPIC GUIDANCE FLUOROSCOPY TIME:  0.2 minutes, 51 uGym2 DAP ANESTHESIA/SEDATION: Intravenous Fentanyl and Versed were administered as conscious sedation during continuous monitoring of the patient's level of consciousness and physiological / cardiorespiratory status by the radiology RN, with a total moderate sedation time of 17 minutes. TECHNIQUE: The procedure, risks, benefits, and alternatives were explained to the patient.  Questions regarding the procedure were encouraged and answered. The patient understands and consents to the procedure. As antibiotic prophylaxis, cefazolin 2 g was ordered pre-procedure and administered intravenously within one hour of incision. Patency of the left IJ vein was confirmed with ultrasound with image documentation. An appropriate skin site was determined. Skin site was marked. Region  was prepped using maximum barrier technique including cap and mask, sterile gown, sterile gloves, large sterile sheet, and Chlorhexidine as cutaneous antisepsis. The region was infiltrated locally with 1% lidocaine. Under real-time ultrasound guidance, the left IJ vein was accessed with a 21 gauge micropuncture needle; the needle tip within the vein was confirmed with ultrasound image documentation. Needle was exchanged over a 018 guidewire for transitional dilator which allowed passage of the [facility] wire into the IVC. Over this, the transitional dilator was exchanged for a 5 [place] MPA catheter. A small incision was made on the left anterior chest wall and a subcutaneous pocket fashioned. The power-injectable port was positioned and its catheter tunneled to the left IJ dermatotomy site. The MPA catheter was exchanged over an Amplatz wire for a peel-away sheath, through which the port catheter, which had been trimmed to the appropriate length, was advanced and positioned under fluoroscopy with its tip at the cavoatrial junction. Spot chest radiograph confirms good catheter position and no pneumothorax. The pocket was closed with deep interrupted and subcuticular continuous 3-0 Monocryl sutures. The port was flushed per protocol. The incisions were covered with Dermabond then covered with a sterile dressing. COMPLICATIONS: COMPLICATIONS None immediate IMPRESSION: Technically successful left IJ power-injectable port catheter placement. Ready for routine use. Electronically Signed   By: Lucrezia Europe M.D.   On: 04/11/2016 13:05     Ir US Guide Vasc Access Left  04/11/2016  CLINICAL DATA:  Breast carcinoma, needs venous access for chemotherapy EXAM: TUNNELED PORT CATHETER PLACEMENT WITH ULTRASOUND AND FLUOROSCOPIC GUIDANCE FLUOROSCOPY TIME:  0.2 minutes, 51 uGym2 DAP ANESTHESIA/SEDATION: Intravenous Fentanyl and Versed were administered as conscious sedation during continuous monitoring of the patient's level of consciousness and physiological / cardiorespiratory status by the radiology RN, with a total moderate sedation time of 17 minutes. TECHNIQUE: The procedure, risks, benefits, and alternatives were explained to the patient. Questions regarding the procedure were encouraged and answered. The patient understands and consents to the procedure. As antibiotic prophylaxis, cefazolin 2 g was ordered pre-procedure and administered intravenously within one hour of incision. Patency of the left IJ vein was confirmed with ultrasound with image documentation. An appropriate skin site was determined. Skin site was marked. Region was prepped using maximum barrier technique including cap and mask, sterile gown, sterile gloves, large sterile sheet, and Chlorhexidine as cutaneous antisepsis. The region was infiltrated locally with 1% lidocaine. Under real-time ultrasound guidance, the left IJ vein was accessed with a 21 gauge micropuncture needle; the needle tip within the vein was confirmed with ultrasound image documentation. Needle was exchanged over a 018 guidewire for transitional dilator which allowed passage of the [facility] wire into the IVC. Over this, the transitional dilator was exchanged for a 5 [place] MPA catheter. A small incision was made on the left anterior chest wall and a subcutaneous pocket fashioned. The power-injectable port was positioned and its catheter tunneled to the left IJ dermatotomy site. The MPA catheter was exchanged over an Amplatz wire for a peel-away sheath, through which the port catheter, which had been trimmed to  the appropriate length, was advanced and positioned under fluoroscopy with its tip at the cavoatrial junction. Spot chest radiograph confirms good catheter position and no pneumothorax. The pocket was closed with deep interrupted and subcuticular continuous 3-0 Monocryl sutures. The port was flushed per protocol. The incisions were covered with Dermabond then covered with a sterile dressing. COMPLICATIONS: COMPLICATIONS None immediate IMPRESSION: Technically successful left IJ power-injectable port catheter placement. Ready for routine use.  Electronically Signed   By: Lucrezia Europe M.D.   On: 04/11/2016 13:05    ASSESSMENT: 40 y.o. [place] woman status post right breast biopsy 01/28/2016 for a clinical T2 N0 invasive ductal carcinoma, grade 3, triple negative, with an MIB-1 of 70%.  (a) suspicious right axillary lymph node biopsied 01/28/2016 was benign  (1) neoadjuvant chemotherapy: doxorubicin and cyclophosphamide in dose dense fashion 4 started 04/14/16 to be followed by paclitaxel and carboplatin weekly 12  (2) genetics testing is pending  (3) definitive surgery to follow  (5) adjuvant radiation to follow as appropriate  PLAN: For her diarrhea, I reexplained the use of imodium and advised her to be liberal about the dosing until her bowels are controlled. I am also sending in a prescription for questran powder to be used twice daily. The goal is for her to have no more than 2 bowel movements daily, preferably soft and formed. I have also sent in a prescription for prilosec for her heartburn. We offered her samples of protein shakes to help keep her weight on, but she declined.   Her bone pain is letting up at this point, but she is going to incorporate the use of NSAIDs for pain control next time.   The labs were reviewed in detail and were stable.   Cyndra will return in 1 week for cycle 2 of treatment. She understands and agrees with this plan. She knows the goal of treatment in her case  is cure. She has been encouraged to call with any issues that might arise before her next visit here.   Laurie Panda, NP   04/21/2016 11:15 AM

## 2016-04-21 NOTE — Telephone Encounter (Signed)
appt made and avs printed °

## 2016-04-26 ENCOUNTER — Ambulatory Visit
Admission: RE | Admit: 2016-04-26 | Discharge: 2016-04-26 | Disposition: A | Discharge status: Home / Self Care | Payer: Medicaid Other | Source: Ambulatory Visit | Attending: Oncology | Admitting: Oncology

## 2016-04-26 DIAGNOSIS — C50411 Malignant neoplasm of upper-outer quadrant of right female breast: Secondary | ICD-10-CM

## 2016-04-26 MED ORDER — GADOBENATE DIMEGLUMINE 529 MG/ML IV SOLN: 16.0000 mL | Freq: Once | INTRAVENOUS | Status: DC | PRN

## 2016-04-27 ENCOUNTER — Encounter: Payer: Self-pay | Admitting: Skilled Nursing Facility1

## 2016-04-27 NOTE — Progress Notes (Signed)
Subjective:     Patient ID: Rebecca [name], female   DOB: June 06, 1976, 40 y.o.   MRN: [id number]  HPI   Review of Systems     Objective:   Physical Exam To assist the pt in dietary strategies to gain lost wt.    Assessment:     Pt identified as being malnourished due to lost wt. Pt contacted via the telephone at [phone]. Pt states she has lost about 9 pounds. Pt states she eats 2 meals every day and usual skips breakfast because she is not really a "breakfast person."    Plan:     Dietitian educated the pt on meal frequency and the benefits of eating 3 meals a day and some snacks in between.

## 2016-04-28 ENCOUNTER — Other Ambulatory Visit: Payer: Self-pay | Admitting: Oncology

## 2016-04-28 ENCOUNTER — Other Ambulatory Visit (HOSPITAL_BASED_OUTPATIENT_CLINIC_OR_DEPARTMENT_OTHER): Payer: Medicaid Other

## 2016-04-28 ENCOUNTER — Ambulatory Visit (HOSPITAL_BASED_OUTPATIENT_CLINIC_OR_DEPARTMENT_OTHER): Payer: Medicaid Other | Admitting: Nurse Practitioner

## 2016-04-28 ENCOUNTER — Ambulatory Visit (HOSPITAL_BASED_OUTPATIENT_CLINIC_OR_DEPARTMENT_OTHER): Payer: Medicaid Other

## 2016-04-28 ENCOUNTER — Telehealth: Payer: Self-pay | Admitting: Nurse Practitioner

## 2016-04-28 ENCOUNTER — Encounter: Payer: Self-pay | Admitting: Nurse Practitioner

## 2016-04-28 VITALS — BP 119/73 | HR 85 | Temp 98.2°F | Resp 19 | Wt 183.6 lb

## 2016-04-28 DIAGNOSIS — C50411 Malignant neoplasm of upper-outer quadrant of right female breast: Secondary | ICD-10-CM

## 2016-04-28 DIAGNOSIS — Z5111 Encounter for antineoplastic chemotherapy: Secondary | ICD-10-CM

## 2016-04-28 DIAGNOSIS — Z171 Estrogen receptor negative status [ER-]: Secondary | ICD-10-CM | POA: Diagnosis not present

## 2016-04-28 DIAGNOSIS — Z720 Tobacco use: Secondary | ICD-10-CM | POA: Diagnosis not present

## 2016-04-28 LAB — CBC WITH DIFFERENTIAL/PLATELET
BASO%: 0.3 % (ref 0.0–2.0)
Basophils Absolute: 0 10*3/uL (ref 0.0–0.1)
EOS ABS: 0.1 10*3/uL (ref 0.0–0.5)
EOS%: 1 % (ref 0.0–7.0)
HEMATOCRIT: 33.6 % — AB (ref 34.8–46.6)
HEMOGLOBIN: 11.1 g/dL — AB (ref 11.6–15.9)
LYMPH#: 1.9 10*3/uL (ref 0.9–3.3)
LYMPH%: 16 % (ref 14.0–49.7)
MCH: 30 pg (ref 25.1–34.0)
MCHC: 33 g/dL (ref 31.5–36.0)
MCV: 90.7 fL (ref 79.5–101.0)
MONO#: 1.1 10*3/uL — AB (ref 0.1–0.9)
MONO%: 9.5 % (ref 0.0–14.0)
NEUT%: 73.2 % (ref 38.4–76.8)
NEUTROS ABS: 8.7 10*3/uL — AB (ref 1.5–6.5)
PLATELETS: 228 10*3/uL (ref 145–400)
RBC: 3.71 10*6/uL (ref 3.70–5.45)
RDW: 13.6 % (ref 11.2–14.5)
WBC: 11.9 10*3/uL — AB (ref 3.9–10.3)

## 2016-04-28 LAB — COMPREHENSIVE METABOLIC PANEL
ALBUMIN: 3.7 g/dL (ref 3.5–5.0)
ALK PHOS: 59 U/L (ref 40–150)
ALT: <9 U/L (ref 0–55)
AST: 10 U/L (ref 5–34)
Anion Gap: 6 mEq/L (ref 3–11)
BUN: 7.7 mg/dL (ref 7.0–26.0)
CO2: 25 mEq/L (ref 22–29)
CREATININE: 0.8 mg/dL (ref 0.6–1.1)
Calcium: 9.1 mg/dL (ref 8.4–10.4)
Chloride: 110 mEq/L — ABNORMAL HIGH (ref 98–109)
EGFR: >90 mL/min/{1.73_m2} (ref >90)
GLUCOSE: 93 mg/dL (ref 70–140)
Potassium: 4.2 mEq/L (ref 3.5–5.1)
SODIUM: 141 meq/L (ref 136–145)
TOTAL PROTEIN: 7.1 g/dL (ref 6.4–8.3)

## 2016-04-28 MED ORDER — PALONOSETRON HCL INJECTION 0.25 MG/5ML
INTRAVENOUS | Status: AC
  Filled 2016-04-28: qty 5

## 2016-04-28 MED ORDER — SODIUM CHLORIDE 0.9 % IV SOLN
Freq: Once | INTRAVENOUS | Status: AC
  Administered 2016-04-28: 12:00:00 via INTRAVENOUS
  Filled 2016-04-28: qty 5

## 2016-04-28 MED ORDER — SODIUM CHLORIDE 0.9 % IV SOLN
Freq: Once | INTRAVENOUS | Status: AC
  Administered 2016-04-28: 12:00:00 via INTRAVENOUS

## 2016-04-28 MED ORDER — HEPARIN SOD (PORK) LOCK FLUSH 100 UNIT/ML IV SOLN
500.0000 [IU] | Freq: Once | INTRAVENOUS | Status: AC | PRN
  Administered 2016-04-28: 500 [IU]
  Filled 2016-04-28: qty 5

## 2016-04-28 MED ORDER — PEGFILGRASTIM 6 MG/0.6ML ~~LOC~~ PSKT
6.0000 mg | PREFILLED_SYRINGE | Freq: Once | SUBCUTANEOUS | Status: AC
  Administered 2016-04-28: 6 mg via SUBCUTANEOUS
  Filled 2016-04-28: qty 0.6

## 2016-04-28 MED ORDER — PALONOSETRON HCL INJECTION 0.25 MG/5ML
0.2500 mg | Freq: Once | INTRAVENOUS | Status: AC
  Administered 2016-04-28: 0.25 mg via INTRAVENOUS

## 2016-04-28 MED ORDER — DOXORUBICIN HCL CHEMO IV INJECTION 2 MG/ML
60.0000 mg/m2 | Freq: Once | INTRAVENOUS | Status: AC
  Administered 2016-04-28: 114 mg via INTRAVENOUS
  Filled 2016-04-28: qty 57

## 2016-04-28 MED ORDER — SODIUM CHLORIDE 0.9 % IV SOLN
600.0000 mg/m2 | Freq: Once | INTRAVENOUS | Status: AC
  Administered 2016-04-28: 1140 mg via INTRAVENOUS
  Filled 2016-04-28: qty 57

## 2016-04-28 MED ORDER — SODIUM CHLORIDE 0.9% FLUSH
10.0000 mL | INTRAVENOUS | Status: DC | PRN
  Administered 2016-04-28: 10 mL
  Filled 2016-04-28: qty 10

## 2016-04-28 NOTE — Patient Instructions (Signed)
[facility] Discharge Instructions for Patients Receiving Chemotherapy  Today you received the following chemotherapy agents Adriamycin and Cytoxan.  To help prevent nausea and vomiting after your treatment, we encourage you to take your nausea medication as prescribed.   If you develop nausea and vomiting that is not controlled by your nausea medication, call the clinic.   BELOW ARE SYMPTOMS THAT SHOULD BE REPORTED IMMEDIATELY:  *FEVER GREATER THAN 100.5 F  *CHILLS WITH OR WITHOUT FEVER  NAUSEA AND VOMITING THAT IS NOT CONTROLLED WITH YOUR NAUSEA MEDICATION  *UNUSUAL SHORTNESS OF BREATH  *UNUSUAL BRUISING OR BLEEDING  TENDERNESS IN MOUTH AND THROAT WITH OR WITHOUT PRESENCE OF ULCERS  *URINARY PROBLEMS  *BOWEL PROBLEMS  UNUSUAL RASH Items with * indicate a potential emergency and should be followed up as soon as possible.  Feel free to call the clinic you have any questions or concerns. The clinic phone number is [phone].  Please show the CHEMO ALERT CARD at check-in to the Emergency Department and triage nurse.   

## 2016-04-28 NOTE — Progress Notes (Signed)
[place]  [phone]) [phone] [phone]   ID: Riely Oetken DOB: 08/18/1976  MR#: [id number]  BJS#:[id number]  Patient Care Team: Dorena Dew, FNP as PCP - General (Family Medicine) Alphonsa Overall, MD as Consulting Physician (General Surgery) Chauncey Cruel, MD as Consulting Physician (Oncology) Gery Pray, MD as Consulting Physician (Radiation Oncology) Sylvan Cheese, NP as Nurse Practitioner (Hematology and Oncology) PCP: Dorena Dew, FNP GYN: OTHER MD:  CHIEF COMPLAINT: triple negative breast cancer  CURRENT TREATMENT: neoadjuvant chemotherapy  BREAST CANCER HISTORY: From the original intake note:  Shahad herself noted a change in her right breast sometime around September or October 2016. She did not bring it to intermediate medical attention, but on 01/13/2016 she established herself in Dr. Smith Robert' service and she was set up for bilateral diagnostic mammography with tomosynthesis and bilateral ultrasonography at the [facility] 01/19/2016. The breast density was category B. In the upper outer quadrant of the right breast there was a spiculated mass measuring 2.8 cm. On physical exam this was palpable. Targeted ultrasonography confirmed an irregular hypoechoic mass in the right breast 11:30 o'clock position measuring 2.6 cm maximally. Ultrasound of the right axilla showed a morphologically abnormal lymph node.  In the left breast there were some tubular densities behind the areola which by ultrasonography showed benign ductal ectasia.  On 01/28/2016 Deaja underwent biopsy of the right breast mass and abnormal right axillary lymph node. The pathology from this procedure (S AAA [id number]) showed the lymph node to be benign. In the breast however there was an invasive ductal carcinoma, grade 3, which was estrogen and progesterone receptor negative. The proliferation marker was 70%. HER-2 was not amplified with a signals ratio of 1.32. The  number per cell was 2.05.  The patient's subsequent history is as detailed below  INTERVAL HISTORY: Shontell returns today for further discussion of her breast cancer treatment, alone. Today is day 1, cycle 2 of doxorubicin and cyclophosphamide, with neulasta onpro for granulocyte support.  REVIEW OF SYSTEMS: All of Kyshas previous side effects have resolved. She denies fevers or chills. Her nausea has subsided. Her bowels are back to normal. Her appetite is good. She denies mouth sores, rashes, or neuropathy symptoms. She has gone ahead and buzzed her hair. Her bone pain has resolved. A detailed review of systems is otherwise stable.  PAST MEDICAL HISTORY: Past Medical History  Diagnosis Date  . Breast cancer ([place])   . Depression   . Anxiety   . Sickle cell trait ([place])   . Obesity (BMI 35.0-39.9 without comorbidity) ([place])   . Hypertension     pt states is currently on no medications   . Seasonal allergies   . GERD (gastroesophageal reflux disease)   . Termination of pregnancy (fetus) 04/02/16    PAST SURGICAL HISTORY: Past Surgical History  Procedure Laterality Date  . Cesarean section      2004 and 2007    FAMILY HISTORY Family History  Problem Relation Age of Onset  . Hypertension Mother   . Uterine cancer Mother   . Cancer Father   . Hypertension Father   . Breast cancer Maternal Aunt   . Prostate cancer Maternal Uncle   . Breast cancer Maternal Grandmother   . Throat cancer Maternal Grandfather   . Breast cancer Cousin   The patient has very little information about her father. Her mother is currently 59 years old. She had a history of cervical cancer at age 44. The patient had 2  brothers, no sisters. The maternal grandfather had throat cancer. A maternal uncle was diagnosed with breast cancer as well as prostate cancer at the age of 26. 2 maternal cousins, one of the mail, had breast cancer as well.  GYNECOLOGIC HISTORY:  No LMP recorded (lmp unknown). Menarche age  40, first live birth age 40. The patient is GX P4. She still having regular periods. She took oral contraceptives in the 1990s with no side effects.  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 The patient's significant other [name] works at [employer]. In addition to them the patient's 3 children Chasmine Gray, Wellington and [name] are also in the home. There are age 62, 33, and 46. The patient's son [name], currently 57 years old, lives in [place].    ADVANCED DIRECTIVES: Not in place   HEALTH MAINTENANCE: Social History  Substance Use Topics  . Smoking status: Current Every Day Smoker -- 0.25 packs/day for 20 years    Types: Cigarettes  . Smokeless tobacco: Never Used  . Alcohol Use: Yes     Comment: occ     Colonoscopy:  PAP:  Bone density:  Lipid panel:  No Known Allergies  Current Outpatient Prescriptions  Medication Sig Dispense Refill  . dexamethasone (DECADRON) 4 MG tablet Take 2 tablets by mouth once a day on the day after chemotherapy and then take 2 tablets two times a day for 2 days. Take with food. 30 tablet 1  . lidocaine-prilocaine (EMLA) cream Apply to affected area once 30 g 3  . LORazepam (ATIVAN) 0.5 MG tablet Take 1 tablet (0.5 mg total) by mouth at bedtime. 30 tablet 0  . omeprazole (PRILOSEC) 20 MG capsule Take 1 capsule (20 mg total) by mouth daily. 30 capsule 3  . ondansetron (ZOFRAN) 8 MG tablet Take 1 tablet (8 mg total) by mouth 2 (two) times daily as needed. Start on the third day after chemotherapy. 30 tablet 1  . cholestyramine (QUESTRAN) 4 g packet Take 1 packet (4 g total) by mouth 3 (three) times daily with meals. (Patient not taking: Reported on 04/28/2016) 60 each 0  . OVER THE COUNTER MEDICATION Take 1 Dose by mouth every other day. Reported on 04/28/2016    . prochlorperazine (COMPAZINE) 10 MG tablet Take 1 tablet (10 mg total) by mouth every 6 (six)  hours as needed (Nausea or vomiting). (Patient not taking: Reported on 04/28/2016) 30 tablet 1   No current facility-administered medications for this visit.    OBJECTIVE: [name] Who appears stated age 29 Vitals:   04/28/16 1057  BP: 119/73  Pulse: 85  Temp: 98.2 F (36.8 C)  Resp: 19     Body mass index is 34.13 kg/(m^2).    ECOG FS:1 - Symptomatic but completely ambulatory  Skin: warm, dry  HEENT: sclerae anicteric, conjunctivae pink, oropharynx clear. No thrush or mucositis.  Lymph Nodes: No cervical or supraclavicular lymphadenopathy  Lungs: clear to auscultation bilaterally, no rales, wheezes, or rhonci  Heart: regular rate and rhythm  Abdomen: round, soft, non tender, positive bowel sounds  Musculoskeletal: No focal spinal tenderness, no peripheral edema  Neuro: non focal, well oriented, positive affect  Breasts: deferred  LAB RESULTS:  CMP     Component Value Date/Time   NA 140 04/21/2016 1021   NA 137 04/11/2016 1045   K 3.3* 04/21/2016 1021   K 3.4*  04/11/2016 1045   CL 106 04/11/2016 1045   CO2 28 04/21/2016 1021   CO2 25 04/11/2016 1045   GLUCOSE 95 04/21/2016 1021   GLUCOSE 84 04/11/2016 1045   BUN 7.3 04/21/2016 1021   BUN 5* 04/11/2016 1045   CREATININE 0.8 04/21/2016 1021   CREATININE 0.78 04/11/2016 1045   CREATININE 0.75 01/12/2016 1115   CALCIUM 9.9 04/21/2016 1021   CALCIUM 8.6* 04/11/2016 1045   PROT 7.6 04/21/2016 1021   PROT 7.1 01/12/2016 1115   ALBUMIN 3.9 04/21/2016 1021   ALBUMIN 4.2 01/12/2016 1115   AST 9 04/21/2016 1021   AST 12 01/12/2016 1115   ALT <9 04/21/2016 1021   ALT 8 01/12/2016 1115   ALKPHOS 79 04/21/2016 1021   ALKPHOS 47 01/12/2016 1115   BILITOT 0.42 04/21/2016 1021   BILITOT 0.4 01/12/2016 1115   GFRNONAA >60 04/11/2016 1045   GFRNONAA >89 01/12/2016 1115   GFRAA >60 04/11/2016 1045   GFRAA >89 01/12/2016 1115    INo results found for: SPEP, UPEP  Lab Results  Component Value Date    WBC 11.9* 04/28/2016   NEUTROABS 8.7* 04/28/2016   HGB 11.1* 04/28/2016   HCT 33.6* 04/28/2016   MCV 90.7 04/28/2016   PLT 228 04/28/2016      Chemistry      Component Value Date/Time   NA 140 04/21/2016 1021   NA 137 04/11/2016 1045   K 3.3* 04/21/2016 1021   K 3.4* 04/11/2016 1045   CL 106 04/11/2016 1045   CO2 28 04/21/2016 1021   CO2 25 04/11/2016 1045   BUN 7.3 04/21/2016 1021   BUN 5* 04/11/2016 1045   CREATININE 0.8 04/21/2016 1021   CREATININE 0.78 04/11/2016 1045   CREATININE 0.75 01/12/2016 1115      Component Value Date/Time   CALCIUM 9.9 04/21/2016 1021   CALCIUM 8.6* 04/11/2016 1045   ALKPHOS 79 04/21/2016 1021   ALKPHOS 47 01/12/2016 1115   AST 9 04/21/2016 1021   AST 12 01/12/2016 1115   ALT <9 04/21/2016 1021   ALT 8 01/12/2016 1115   BILITOT 0.42 04/21/2016 1021   BILITOT 0.4 01/12/2016 1115       No results found for: LABCA2  No components found for: LABCA125  No results for input(s): INR in the last 168 hours.  Urinalysis    Component Value Date/Time   LABSPEC 1.020 01/12/2016 1122   PHURINE 5.5 01/12/2016 1122   GLUCOSEU NEGATIVE 01/12/2016 1122   HGBUR TRACE* 01/12/2016 1122   BILIRUBINUR NEGATIVE 01/12/2016 1122   KETONESUR NEGATIVE 01/12/2016 1122   PROTEINUR NEGATIVE 01/12/2016 1122   UROBILINOGEN 0.2 01/12/2016 1122   NITRITE NEGATIVE 01/12/2016 1122   LEUKOCYTESUR NEGATIVE 01/12/2016 1122    ELIGIBLE FOR AVAILABLE RESEARCH PROTOCOL: PREVENT?  STUDIES: Ir Fluoro Guide Cv Line Left  04/11/2016  CLINICAL DATA:  Breast carcinoma, needs venous access for chemotherapy EXAM: TUNNELED PORT CATHETER PLACEMENT WITH ULTRASOUND AND FLUOROSCOPIC GUIDANCE FLUOROSCOPY TIME:  0.2 minutes, 51 uGym2 DAP ANESTHESIA/SEDATION: Intravenous Fentanyl and Versed were administered as conscious sedation during continuous monitoring of the patient's level of consciousness and physiological / cardiorespiratory status by the radiology RN, with a total  moderate sedation time of 17 minutes. TECHNIQUE: The procedure, risks, benefits, and alternatives were explained to the patient. Questions regarding the procedure were encouraged and answered. The patient understands and consents to the procedure. As antibiotic prophylaxis, cefazolin 2 g was ordered pre-procedure and administered intravenously within one hour of incision. Patency of  the left IJ vein was confirmed with ultrasound with image documentation. An appropriate skin site was determined. Skin site was marked. Region was prepped using maximum barrier technique including cap and mask, sterile gown, sterile gloves, large sterile sheet, and Chlorhexidine as cutaneous antisepsis. The region was infiltrated locally with 1% lidocaine. Under real-time ultrasound guidance, the left IJ vein was accessed with a 21 gauge micropuncture needle; the needle tip within the vein was confirmed with ultrasound image documentation. Needle was exchanged over a 018 guidewire for transitional dilator which allowed passage of the [facility] wire into the IVC. Over this, the transitional dilator was exchanged for a 5 [place] MPA catheter. A small incision was made on the left anterior chest wall and a subcutaneous pocket fashioned. The power-injectable port was positioned and its catheter tunneled to the left IJ dermatotomy site. The MPA catheter was exchanged over an Amplatz wire for a peel-away sheath, through which the port catheter, which had been trimmed to the appropriate length, was advanced and positioned under fluoroscopy with its tip at the cavoatrial junction. Spot chest radiograph confirms good catheter position and no pneumothorax. The pocket was closed with deep interrupted and subcuticular continuous 3-0 Monocryl sutures. The port was flushed per protocol. The incisions were covered with Dermabond then covered with a sterile dressing. COMPLICATIONS: COMPLICATIONS None immediate IMPRESSION: Technically successful left IJ  power-injectable port catheter placement. Ready for routine use. Electronically Signed   By: Lucrezia Europe M.D.   On: 04/11/2016 13:05   Ir US Guide Vasc Access Left  04/11/2016  CLINICAL DATA:  Breast carcinoma, needs venous access for chemotherapy EXAM: TUNNELED PORT CATHETER PLACEMENT WITH ULTRASOUND AND FLUOROSCOPIC GUIDANCE FLUOROSCOPY TIME:  0.2 minutes, 51 uGym2 DAP ANESTHESIA/SEDATION: Intravenous Fentanyl and Versed were administered as conscious sedation during continuous monitoring of the patient's level of consciousness and physiological / cardiorespiratory status by the radiology RN, with a total moderate sedation time of 17 minutes. TECHNIQUE: The procedure, risks, benefits, and alternatives were explained to the patient. Questions regarding the procedure were encouraged and answered. The patient understands and consents to the procedure. As antibiotic prophylaxis, cefazolin 2 g was ordered pre-procedure and administered intravenously within one hour of incision. Patency of the left IJ vein was confirmed with ultrasound with image documentation. An appropriate skin site was determined. Skin site was marked. Region was prepped using maximum barrier technique including cap and mask, sterile gown, sterile gloves, large sterile sheet, and Chlorhexidine as cutaneous antisepsis. The region was infiltrated locally with 1% lidocaine. Under real-time ultrasound guidance, the left IJ vein was accessed with a 21 gauge micropuncture needle; the needle tip within the vein was confirmed with ultrasound image documentation. Needle was exchanged over a 018 guidewire for transitional dilator which allowed passage of the [facility] wire into the IVC. Over this, the transitional dilator was exchanged for a 5 [place] MPA catheter. A small incision was made on the left anterior chest wall and a subcutaneous pocket fashioned. The power-injectable port was positioned and its catheter tunneled to the left IJ dermatotomy site. The  MPA catheter was exchanged over an Amplatz wire for a peel-away sheath, through which the port catheter, which had been trimmed to the appropriate length, was advanced and positioned under fluoroscopy with its tip at the cavoatrial junction. Spot chest radiograph confirms good catheter position and no pneumothorax. The pocket was closed with deep interrupted and subcuticular continuous 3-0 Monocryl sutures. The port was flushed per protocol. The incisions were covered with Dermabond then covered  with a sterile dressing. COMPLICATIONS: COMPLICATIONS None immediate IMPRESSION: Technically successful left IJ power-injectable port catheter placement. Ready for routine use. Electronically Signed   By: Lucrezia Europe M.D.   On: 04/11/2016 13:05    ASSESSMENT: 40 y.o. [place] woman status post right breast biopsy 01/28/2016 for a clinical T2 N0 invasive ductal carcinoma, grade 3, triple negative, with an MIB-1 of 70%.  (a) suspicious right axillary lymph node biopsied 01/28/2016 was benign  (1) neoadjuvant chemotherapy: doxorubicin and cyclophosphamide in dose dense fashion 4 started 04/14/16 to be followed by paclitaxel and carboplatin weekly 12  (2) genetics testing is pending  (3) definitive surgery to follow  (5) adjuvant radiation to follow as appropriate  PLAN: Elayne looks and feels well today the labs were reviewed in detail and were entirely stable. She will proceed with cycle 2 of doxorubicin and cyclophosphamide as planned today.   Romayne will return She understands and agrees with this plan. She knows the goal of treatment in her case is cure. She has been encouraged to call with any issues that might arise before her next visit here.   Laurie Panda, NP   04/28/2016 11:26 AM

## 2016-04-28 NOTE — Telephone Encounter (Signed)
appt made and avs printed °

## 2016-05-05 ENCOUNTER — Encounter: Payer: Self-pay | Admitting: Nurse Practitioner

## 2016-05-05 ENCOUNTER — Encounter: Payer: Self-pay | Admitting: *Deleted

## 2016-05-05 ENCOUNTER — Other Ambulatory Visit (HOSPITAL_BASED_OUTPATIENT_CLINIC_OR_DEPARTMENT_OTHER): Payer: Medicaid Other

## 2016-05-05 ENCOUNTER — Ambulatory Visit (HOSPITAL_BASED_OUTPATIENT_CLINIC_OR_DEPARTMENT_OTHER): Payer: Medicaid Other | Admitting: Nurse Practitioner

## 2016-05-05 VITALS — BP 125/87 | HR 92 | Temp 98.3°F | Resp 18 | Wt 180.8 lb

## 2016-05-05 DIAGNOSIS — D6481 Anemia due to antineoplastic chemotherapy: Secondary | ICD-10-CM | POA: Diagnosis not present

## 2016-05-05 DIAGNOSIS — C50411 Malignant neoplasm of upper-outer quadrant of right female breast: Secondary | ICD-10-CM

## 2016-05-05 LAB — CBC WITH DIFFERENTIAL/PLATELET
BASO%: 0.8 % (ref 0.0–2.0)
Basophils Absolute: 0 10*3/uL (ref 0.0–0.1)
EOS ABS: 0 10*3/uL (ref 0.0–0.5)
EOS%: 0.3 % (ref 0.0–7.0)
HCT: 31.6 % — ABNORMAL LOW (ref 34.8–46.6)
HGB: 10.7 g/dL — ABNORMAL LOW (ref 11.6–15.9)
LYMPH%: 24 % (ref 14.0–49.7)
MCH: 30.6 pg (ref 25.1–34.0)
MCHC: 33.7 g/dL (ref 31.5–36.0)
MCV: 90.7 fL (ref 79.5–101.0)
MONO#: 0.2 10*3/uL (ref 0.1–0.9)
MONO%: 4.8 % (ref 0.0–14.0)
NEUT%: 70.1 % (ref 38.4–76.8)
NEUTROS ABS: 2.3 10*3/uL (ref 1.5–6.5)
Platelets: 194 10*3/uL (ref 145–400)
RBC: 3.49 10*6/uL — AB (ref 3.70–5.45)
RDW: 14.2 % (ref 11.2–14.5)
WBC: 3.3 10*3/uL — AB (ref 3.9–10.3)
lymph#: 0.8 10*3/uL — ABNORMAL LOW (ref 0.9–3.3)

## 2016-05-05 LAB — COMPREHENSIVE METABOLIC PANEL
ALT: <9 U/L (ref 0–55)
AST: 9 U/L (ref 5–34)
Albumin: 3.6 g/dL (ref 3.5–5.0)
Alkaline Phosphatase: 89 U/L (ref 40–150)
Anion Gap: 6 mEq/L (ref 3–11)
BILIRUBIN TOTAL: 0.4 mg/dL (ref 0.20–1.20)
BUN: 5.7 mg/dL — ABNORMAL LOW (ref 7.0–26.0)
CHLORIDE: 107 meq/L (ref 98–109)
CO2: 28 meq/L (ref 22–29)
Calcium: 9.3 mg/dL (ref 8.4–10.4)
Creatinine: 0.8 mg/dL (ref 0.6–1.1)
GLUCOSE: 100 mg/dL (ref 70–140)
POTASSIUM: 3.5 meq/L (ref 3.5–5.1)
SODIUM: 140 meq/L (ref 136–145)
TOTAL PROTEIN: 6.9 g/dL (ref 6.4–8.3)

## 2016-05-05 NOTE — Progress Notes (Signed)
[place]  [phone] [phone]   ID: Cammi Consalvo DOB: 1975/12/29  MR#: [id number]  PYP#:[id number]  Patient Care Team: Dorena Dew, FNP as PCP - General (Family Medicine) Alphonsa Overall, MD as Consulting Physician (General Surgery) Chauncey Cruel, MD as Consulting Physician (Oncology) Gery Pray, MD as Consulting Physician (Radiation Oncology) Sylvan Cheese, NP as Nurse Practitioner (Hematology and Oncology) PCP: Dorena Dew, FNP GYN: OTHER MD:  CHIEF COMPLAINT: triple negative breast cancer  CURRENT TREATMENT: neoadjuvant chemotherapy  BREAST CANCER HISTORY: From the original intake note:  Annison herself noted a change in her right breast sometime around September or October 2016. She did not bring it to intermediate medical attention, but on 01/13/2016 she established herself in Dr. Smith Robert' service and she was set up for bilateral diagnostic mammography with tomosynthesis and bilateral ultrasonography at the [facility] 01/19/2016. The breast density was category B. In the upper outer quadrant of the right breast there was a spiculated mass measuring 2.8 cm. On physical exam this was palpable. Targeted ultrasonography confirmed an irregular hypoechoic mass in the right breast 11:30 o'clock position measuring 2.6 cm maximally. Ultrasound of the right axilla showed a morphologically abnormal lymph node.  In the left breast there were some tubular densities behind the areola which by ultrasonography showed benign ductal ectasia.  On 01/28/2016 [place] underwent biopsy of the right breast mass and abnormal right axillary lymph node. The pathology from this procedure (S [id number]) showed the lymph node to be benign. In the breast however there was an invasive ductal carcinoma, grade 3, which was estrogen and progesterone receptor negative. The proliferation marker was 70%. HER-2 was not amplified with a signals ratio of 1.32. The  number per cell was 2.05.  The patient's subsequent history is as detailed below  INTERVAL HISTORY: Jonee returns today for further discussion of her breast cancer treatment, alone. Today is day 8, cycle 2 of doxorubicin and cyclophosphamide, with neulasta onpro for granulocyte support.  REVIEW OF SYSTEMS: Cherylyn had a much improved experience with this past treatment. She denies fevers or chills. Her nausea was minimal. She is moving her bowels well with no issues. She denies mouth sores, rashes, or neuropathy symptoms. Her appetite is good. She is not having any bone pain. Her only complaint is that she is not sleeping well.A detailed review of systems is otherwise stable.   PAST MEDICAL HISTORY: Past Medical History  Diagnosis Date  . Breast cancer ([place])   . Depression   . Anxiety   . Sickle cell trait ([place])   . Obesity (BMI 35.0-39.9 without comorbidity) ([place])   . Hypertension     pt states is currently on no medications   . Seasonal allergies   . GERD (gastroesophageal reflux disease)   . Termination of pregnancy (fetus) 04/02/16    PAST SURGICAL HISTORY: Past Surgical History  Procedure Laterality Date  . Cesarean section      2004 and 2007    FAMILY HISTORY Family History  Problem Relation Age of Onset  . Hypertension Mother   . Uterine cancer Mother   . Cancer Father   . Hypertension Father   . Breast cancer Maternal Aunt   . Prostate cancer Maternal Uncle   . Breast cancer Maternal Grandmother   . Throat cancer Maternal Grandfather   . Breast cancer Cousin   The patient has very little information about her father. Her mother is currently 53 years old. She had a history of  cervical cancer at age 69. The patient had 2 brothers, no sisters. The maternal grandfather had throat cancer. A maternal uncle was diagnosed with breast cancer as well as prostate cancer at the age of 35. 2 maternal cousins, one of the mail, had breast cancer as well.  GYNECOLOGIC HISTORY:    No LMP recorded (lmp unknown). Menarche age 72, first live birth age 73. The patient is GX P4. She still having regular periods. She took oral contraceptives in the 1990s with no side effects.  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 The patient's significant other [name] works at [employer]. In addition to them the patient's 3 children Chasmine [place], Lilly and [name] are also in the home. There are age 24, 79, and 38. The patient's son [name], currently 66 years old, lives in [place].    ADVANCED DIRECTIVES: Not in place   HEALTH MAINTENANCE: Social History  Substance Use Topics  . Smoking status: Current Every Day Smoker -- 0.25 packs/day for 20 years    Types: Cigarettes  . Smokeless tobacco: Never Used  . Alcohol Use: Yes     Comment: occ     Colonoscopy:  PAP:  Bone density:  Lipid panel:  No Known Allergies  Current Outpatient Prescriptions  Medication Sig Dispense Refill  . dexamethasone (DECADRON) 4 MG tablet Take 2 tablets by mouth once a day on the day after chemotherapy and then take 2 tablets two times a day for 2 days. Take with food. 30 tablet 1  . lidocaine-prilocaine (EMLA) cream Apply to affected area once 30 g 3  . LORazepam (ATIVAN) 0.5 MG tablet Take 1 tablet (0.5 mg total) by mouth at bedtime. 30 tablet 0  . omeprazole (PRILOSEC) 20 MG capsule Take 1 capsule (20 mg total) by mouth daily. 30 capsule 3  . cholestyramine (QUESTRAN) 4 g packet Take 1 packet (4 g total) by mouth 3 (three) times daily with meals. (Patient not taking: Reported on 04/28/2016) 60 each 0  . ondansetron (ZOFRAN) 8 MG tablet Take 1 tablet (8 mg total) by mouth 2 (two) times daily as needed. Start on the third day after chemotherapy. (Patient not taking: Reported on 05/05/2016) 30 tablet 1  . OVER THE COUNTER MEDICATION Take 1 Dose by mouth every other day. Reported on 05/05/2016    .  prochlorperazine (COMPAZINE) 10 MG tablet Take 1 tablet (10 mg total) by mouth every 6 (six) hours as needed (Nausea or vomiting). (Patient not taking: Reported on 04/28/2016) 30 tablet 1   No current facility-administered medications for this visit.    OBJECTIVE: [name] Who appears stated age 40 Vitals:   05/05/16 0947  BP: 125/87  Pulse: 92  Temp: 98.3 F (36.8 C)  Resp: 18     Body mass index is 33.61 kg/(m^2).    ECOG FS:1 - Symptomatic but completely ambulatory  Sclerae unicteric, pupils round and equal Oropharynx clear and moist-- no thrush or other lesions No cervical or supraclavicular adenopathy Lungs no rales or rhonchi Heart regular rate and rhythm Abd soft, nontender, positive bowel sounds MSK no focal spinal tenderness, no upper extremity lymphedema Neuro: nonfocal, well oriented, appropriate affect Breasts: deferred  LAB RESULTS:  CMP     Component Value Date/Time   NA 141 04/28/2016 1031   NA 137 04/11/2016 1045   K 4.2 04/28/2016 1031   K 3.4*  04/11/2016 1045   CL 106 04/11/2016 1045   CO2 25 04/28/2016 1031   CO2 25 04/11/2016 1045   GLUCOSE 93 04/28/2016 1031   GLUCOSE 84 04/11/2016 1045   BUN 7.7 04/28/2016 1031   BUN 5* 04/11/2016 1045   CREATININE 0.8 04/28/2016 1031   CREATININE 0.78 04/11/2016 1045   CREATININE 0.75 01/12/2016 1115   CALCIUM 9.1 04/28/2016 1031   CALCIUM 8.6* 04/11/2016 1045   PROT 7.1 04/28/2016 1031   PROT 7.1 01/12/2016 1115   ALBUMIN 3.7 04/28/2016 1031   ALBUMIN 4.2 01/12/2016 1115   AST 10 04/28/2016 1031   AST 12 01/12/2016 1115   ALT <9 04/28/2016 1031   ALT 8 01/12/2016 1115   ALKPHOS 59 04/28/2016 1031   ALKPHOS 47 01/12/2016 1115   BILITOT <0.30 04/28/2016 1031   BILITOT 0.4 01/12/2016 1115   GFRNONAA >60 04/11/2016 1045   GFRNONAA >89 01/12/2016 1115   GFRAA >60 04/11/2016 1045   GFRAA >89 01/12/2016 1115    INo results found for: SPEP, UPEP  Lab Results  Component Value Date    WBC 3.3* 05/05/2016   NEUTROABS 2.3 05/05/2016   HGB 10.7* 05/05/2016   HCT 31.6* 05/05/2016   MCV 90.7 05/05/2016   PLT 194 05/05/2016      Chemistry      Component Value Date/Time   NA 141 04/28/2016 1031   NA 137 04/11/2016 1045   K 4.2 04/28/2016 1031   K 3.4* 04/11/2016 1045   CL 106 04/11/2016 1045   CO2 25 04/28/2016 1031   CO2 25 04/11/2016 1045   BUN 7.7 04/28/2016 1031   BUN 5* 04/11/2016 1045   CREATININE 0.8 04/28/2016 1031   CREATININE 0.78 04/11/2016 1045   CREATININE 0.75 01/12/2016 1115      Component Value Date/Time   CALCIUM 9.1 04/28/2016 1031   CALCIUM 8.6* 04/11/2016 1045   ALKPHOS 59 04/28/2016 1031   ALKPHOS 47 01/12/2016 1115   AST 10 04/28/2016 1031   AST 12 01/12/2016 1115   ALT <9 04/28/2016 1031   ALT 8 01/12/2016 1115   BILITOT <0.30 04/28/2016 1031   BILITOT 0.4 01/12/2016 1115       No results found for: LABCA2  No components found for: LABCA125  No results for input(s): INR in the last 168 hours.  Urinalysis    Component Value Date/Time   LABSPEC 1.020 01/12/2016 1122   PHURINE 5.5 01/12/2016 1122   GLUCOSEU NEGATIVE 01/12/2016 1122   HGBUR TRACE* 01/12/2016 1122   BILIRUBINUR NEGATIVE 01/12/2016 1122   KETONESUR NEGATIVE 01/12/2016 1122   PROTEINUR NEGATIVE 01/12/2016 1122   UROBILINOGEN 0.2 01/12/2016 1122   NITRITE NEGATIVE 01/12/2016 1122   LEUKOCYTESUR NEGATIVE 01/12/2016 1122    ELIGIBLE FOR AVAILABLE RESEARCH PROTOCOL: PREVENT?  STUDIES: Ir Fluoro Guide Cv Line Left  04/11/2016  CLINICAL DATA:  Breast carcinoma, needs venous access for chemotherapy EXAM: TUNNELED PORT CATHETER PLACEMENT WITH ULTRASOUND AND FLUOROSCOPIC GUIDANCE FLUOROSCOPY TIME:  0.2 minutes, 51 uGym2 DAP ANESTHESIA/SEDATION: Intravenous Fentanyl and Versed were administered as conscious sedation during continuous monitoring of the patient's level of consciousness and physiological / cardiorespiratory status by the radiology RN, with a total  moderate sedation time of 17 minutes. TECHNIQUE: The procedure, risks, benefits, and alternatives were explained to the patient. Questions regarding the procedure were encouraged and answered. The patient understands and consents to the procedure. As antibiotic prophylaxis, cefazolin 2 g was ordered pre-procedure and administered intravenously within one hour of incision. Patency of  the left IJ vein was confirmed with ultrasound with image documentation. An appropriate skin site was determined. Skin site was marked. Region was prepped using maximum barrier technique including cap and mask, sterile gown, sterile gloves, large sterile sheet, and Chlorhexidine as cutaneous antisepsis. The region was infiltrated locally with 1% lidocaine. Under real-time ultrasound guidance, the left IJ vein was accessed with a 21 gauge micropuncture needle; the needle tip within the vein was confirmed with ultrasound image documentation. Needle was exchanged over a 018 guidewire for transitional dilator which allowed passage of the [facility] wire into the IVC. Over this, the transitional dilator was exchanged for a 5 [place] MPA catheter. A small incision was made on the left anterior chest wall and a subcutaneous pocket fashioned. The power-injectable port was positioned and its catheter tunneled to the left IJ dermatotomy site. The MPA catheter was exchanged over an Amplatz wire for a peel-away sheath, through which the port catheter, which had been trimmed to the appropriate length, was advanced and positioned under fluoroscopy with its tip at the cavoatrial junction. Spot chest radiograph confirms good catheter position and no pneumothorax. The pocket was closed with deep interrupted and subcuticular continuous 3-0 Monocryl sutures. The port was flushed per protocol. The incisions were covered with Dermabond then covered with a sterile dressing. COMPLICATIONS: COMPLICATIONS None immediate IMPRESSION: Technically successful left IJ  power-injectable port catheter placement. Ready for routine use. Electronically Signed   By: Lucrezia Europe M.D.   On: 04/11/2016 13:05   Ir US Guide Vasc Access Left  04/11/2016  CLINICAL DATA:  Breast carcinoma, needs venous access for chemotherapy EXAM: TUNNELED PORT CATHETER PLACEMENT WITH ULTRASOUND AND FLUOROSCOPIC GUIDANCE FLUOROSCOPY TIME:  0.2 minutes, 51 uGym2 DAP ANESTHESIA/SEDATION: Intravenous Fentanyl and Versed were administered as conscious sedation during continuous monitoring of the patient's level of consciousness and physiological / cardiorespiratory status by the radiology RN, with a total moderate sedation time of 17 minutes. TECHNIQUE: The procedure, risks, benefits, and alternatives were explained to the patient. Questions regarding the procedure were encouraged and answered. The patient understands and consents to the procedure. As antibiotic prophylaxis, cefazolin 2 g was ordered pre-procedure and administered intravenously within one hour of incision. Patency of the left IJ vein was confirmed with ultrasound with image documentation. An appropriate skin site was determined. Skin site was marked. Region was prepped using maximum barrier technique including cap and mask, sterile gown, sterile gloves, large sterile sheet, and Chlorhexidine as cutaneous antisepsis. The region was infiltrated locally with 1% lidocaine. Under real-time ultrasound guidance, the left IJ vein was accessed with a 21 gauge micropuncture needle; the needle tip within the vein was confirmed with ultrasound image documentation. Needle was exchanged over a 018 guidewire for transitional dilator which allowed passage of the [facility] wire into the IVC. Over this, the transitional dilator was exchanged for a 5 [place] MPA catheter. A small incision was made on the left anterior chest wall and a subcutaneous pocket fashioned. The power-injectable port was positioned and its catheter tunneled to the left IJ dermatotomy site. The  MPA catheter was exchanged over an Amplatz wire for a peel-away sheath, through which the port catheter, which had been trimmed to the appropriate length, was advanced and positioned under fluoroscopy with its tip at the cavoatrial junction. Spot chest radiograph confirms good catheter position and no pneumothorax. The pocket was closed with deep interrupted and subcuticular continuous 3-0 Monocryl sutures. The port was flushed per protocol. The incisions were covered with Dermabond then covered  with a sterile dressing. COMPLICATIONS: COMPLICATIONS None immediate IMPRESSION: Technically successful left IJ power-injectable port catheter placement. Ready for routine use. Electronically Signed   By: Lucrezia Europe M.D.   On: 04/11/2016 13:05    ASSESSMENT: 40 y.o. [place] woman status post right breast biopsy 01/28/2016 for a clinical T2 N0 invasive ductal carcinoma, grade 3, triple negative, with an MIB-1 of 70%.  (a) suspicious right axillary lymph node biopsied 01/28/2016 was benign  (1) neoadjuvant chemotherapy: doxorubicin and cyclophosphamide in dose dense fashion 4 started 04/14/16 to be followed by paclitaxel and carboplatin weekly 12  (2) genetics testing is pending  (3) definitive surgery to follow  (5) adjuvant radiation to follow as appropriate  PLAN: Semya continues to manage treatment remarkably well. The labs were reviewed in detail and were stable. She has some treatment related anemia that we will continue monitor.   Griselle will return in 1 week for cycle 3 of treatment. She knows the goal of treatment in her case is cure. She has been encouraged to call with any issues that might arise before her next visit here.   Laurie Panda, NP   05/05/2016 10:23 AM

## 2016-05-09 ENCOUNTER — Other Ambulatory Visit: Payer: Self-pay | Admitting: Nurse Practitioner

## 2016-05-12 ENCOUNTER — Other Ambulatory Visit: Payer: Self-pay | Admitting: *Deleted

## 2016-05-12 ENCOUNTER — Ambulatory Visit (HOSPITAL_BASED_OUTPATIENT_CLINIC_OR_DEPARTMENT_OTHER): Payer: Medicaid Other

## 2016-05-12 ENCOUNTER — Ambulatory Visit (HOSPITAL_BASED_OUTPATIENT_CLINIC_OR_DEPARTMENT_OTHER): Payer: Medicaid Other | Admitting: Nurse Practitioner

## 2016-05-12 ENCOUNTER — Ambulatory Visit (HOSPITAL_BASED_OUTPATIENT_CLINIC_OR_DEPARTMENT_OTHER): Payer: Medicaid Other | Admitting: *Deleted

## 2016-05-12 ENCOUNTER — Encounter: Payer: Self-pay | Admitting: *Deleted

## 2016-05-12 ENCOUNTER — Encounter: Payer: Self-pay | Admitting: Nurse Practitioner

## 2016-05-12 VITALS — BP 117/84 | HR 80 | Temp 98.2°F | Resp 18 | Ht 61.5 in | Wt 184.2 lb

## 2016-05-12 DIAGNOSIS — C50411 Malignant neoplasm of upper-outer quadrant of right female breast: Secondary | ICD-10-CM | POA: Diagnosis present

## 2016-05-12 DIAGNOSIS — Z720 Tobacco use: Secondary | ICD-10-CM

## 2016-05-12 DIAGNOSIS — G4700 Insomnia, unspecified: Secondary | ICD-10-CM

## 2016-05-12 DIAGNOSIS — Z5111 Encounter for antineoplastic chemotherapy: Secondary | ICD-10-CM | POA: Diagnosis not present

## 2016-05-12 LAB — CBC WITH DIFFERENTIAL/PLATELET
BASO%: 0.5 % (ref 0.0–2.0)
Basophils Absolute: 0.1 10*3/uL (ref 0.0–0.1)
EOS%: 0.5 % (ref 0.0–7.0)
Eosinophils Absolute: 0.1 10*3/uL (ref 0.0–0.5)
HEMATOCRIT: 32.4 % — AB (ref 34.8–46.6)
HGB: 10.8 g/dL — ABNORMAL LOW (ref 11.6–15.9)
LYMPH%: 12.6 % — AB (ref 14.0–49.7)
MCH: 30.1 pg (ref 25.1–34.0)
MCHC: 33.1 g/dL (ref 31.5–36.0)
MCV: 91 fL (ref 79.5–101.0)
MONO#: 1.6 10*3/uL — AB (ref 0.1–0.9)
MONO%: 12.7 % (ref 0.0–14.0)
NEUT#: 9 10*3/uL — ABNORMAL HIGH (ref 1.5–6.5)
NEUT%: 73.7 % (ref 38.4–76.8)
PLATELETS: 171 10*3/uL (ref 145–400)
RBC: 3.57 10*6/uL — AB (ref 3.70–5.45)
RDW: 14.4 % (ref 11.2–14.5)
WBC: 12.3 10*3/uL — AB (ref 3.9–10.3)
lymph#: 1.5 10*3/uL (ref 0.9–3.3)

## 2016-05-12 LAB — COMPREHENSIVE METABOLIC PANEL
ANION GAP: 7 meq/L (ref 3–11)
AST: 11 U/L (ref 5–34)
Albumin: 3.7 g/dL (ref 3.5–5.0)
Alkaline Phosphatase: 59 U/L (ref 40–150)
BUN: 4.4 mg/dL — ABNORMAL LOW (ref 7.0–26.0)
CALCIUM: 9.2 mg/dL (ref 8.4–10.4)
CHLORIDE: 109 meq/L (ref 98–109)
CO2: 27 mEq/L (ref 22–29)
CREATININE: 0.8 mg/dL (ref 0.6–1.1)
Glucose: 92 mg/dl (ref 70–140)
Potassium: 3.6 mEq/L (ref 3.5–5.1)
Sodium: 143 mEq/L (ref 136–145)
Total Bilirubin: <0.3 mg/dL (ref 0.20–1.20)
Total Protein: 7 g/dL (ref 6.4–8.3)

## 2016-05-12 MED ORDER — PALONOSETRON HCL INJECTION 0.25 MG/5ML
0.2500 mg | Freq: Once | INTRAVENOUS | Status: AC
  Administered 2016-05-12: 0.25 mg via INTRAVENOUS

## 2016-05-12 MED ORDER — HEPARIN SOD (PORK) LOCK FLUSH 100 UNIT/ML IV SOLN
500.0000 [IU] | Freq: Once | INTRAVENOUS | Status: AC | PRN
  Administered 2016-05-12: 500 [IU]
  Filled 2016-05-12: qty 5

## 2016-05-12 MED ORDER — PEGFILGRASTIM 6 MG/0.6ML ~~LOC~~ PSKT
6.0000 mg | PREFILLED_SYRINGE | Freq: Once | SUBCUTANEOUS | Status: AC
  Administered 2016-05-12: 6 mg via SUBCUTANEOUS
  Filled 2016-05-12: qty 0.6

## 2016-05-12 MED ORDER — SODIUM CHLORIDE 0.9 % IV SOLN
Freq: Once | INTRAVENOUS | Status: AC
  Administered 2016-05-12: 12:00:00 via INTRAVENOUS

## 2016-05-12 MED ORDER — LORAZEPAM 0.5 MG PO TABS: 0.5000 mg | ORAL_TABLET | Freq: Every day | ORAL | Status: DC

## 2016-05-12 MED ORDER — SODIUM CHLORIDE 0.9% FLUSH
10.0000 mL | INTRAVENOUS | Status: DC | PRN
  Administered 2016-05-12: 10 mL
  Filled 2016-05-12: qty 10

## 2016-05-12 MED ORDER — DOXORUBICIN HCL CHEMO IV INJECTION 2 MG/ML
60.0000 mg/m2 | Freq: Once | INTRAVENOUS | Status: AC
  Administered 2016-05-12: 114 mg via INTRAVENOUS
  Filled 2016-05-12: qty 57

## 2016-05-12 MED ORDER — SODIUM CHLORIDE 0.9 % IV SOLN
600.0000 mg/m2 | Freq: Once | INTRAVENOUS | Status: AC
  Administered 2016-05-12: 1140 mg via INTRAVENOUS
  Filled 2016-05-12: qty 57

## 2016-05-12 MED ORDER — SODIUM CHLORIDE 0.9 % IV SOLN
Freq: Once | INTRAVENOUS | Status: AC
  Administered 2016-05-12: 12:00:00 via INTRAVENOUS
  Filled 2016-05-12: qty 5

## 2016-05-12 MED ORDER — PALONOSETRON HCL INJECTION 0.25 MG/5ML
INTRAVENOUS | Status: AC
  Filled 2016-05-12: qty 5

## 2016-05-12 NOTE — Progress Notes (Signed)
[facility]  [phone] [phone]   ID: Ambri Miltner DOB: 01/02/1976  MR#: [id number]  DHW#:[id number]  Patient Care Team: Dorena Dew, FNP as PCP - General (Family Medicine) Alphonsa Overall, MD as Consulting Physician (General Surgery) Chauncey Cruel, MD as Consulting Physician (Oncology) Gery Pray, MD as Consulting Physician (Radiation Oncology) Sylvan Cheese, NP as Nurse Practitioner (Hematology and Oncology) PCP: Dorena Dew, FNP GYN: OTHER MD:  CHIEF COMPLAINT: triple negative breast cancer  CURRENT TREATMENT: neoadjuvant chemotherapy  BREAST CANCER HISTORY: From the original intake note:  Rosalynd herself noted a change in her right breast sometime around September or October 2016. She did not bring it to intermediate medical attention, but on 01/13/2016 she established herself in Dr. Smith Robert' service and she was set up for bilateral diagnostic mammography with tomosynthesis and bilateral ultrasonography at the [facility] 01/19/2016. The breast density was category B. In the upper outer quadrant of the right breast there was a spiculated mass measuring 2.8 cm. On physical exam this was palpable. Targeted ultrasonography confirmed an irregular hypoechoic mass in the right breast 11:30 o'clock position measuring 2.6 cm maximally. Ultrasound of the right axilla showed a morphologically abnormal lymph node.  In the left breast there were some tubular densities behind the areola which by ultrasonography showed benign ductal ectasia.  On 01/28/2016 Rei underwent biopsy of the right breast mass and abnormal right axillary lymph node. The pathology from this procedure (S [id number]) showed the lymph node to be benign. In the breast however there was an invasive ductal carcinoma, grade 3, which was estrogen and progesterone receptor negative. The proliferation marker was 70%. HER-2 was not amplified with a signals ratio of 1.32. The  number per cell was 2.05.  The patient's subsequent history is as detailed below  INTERVAL HISTORY: Carola returns today for further discussion of her breast cancer treatment, alone. Today is day 1, cycle 3 of doxorubicin and cyclophosphamide, with neulasta onpro for granulocyte support.  REVIEW OF SYSTEMS: Rajanee has few physical issues today. She is mildly fatigued. She is not sleeping well despite ativan use. She denies fevers, chills, nausea, vomiting, or changes in bowel or bladder habits. She has no mouth sores, rashes, or neuropathy symptoms. Her appetite is good. A detailed review of systems is otherwise stable.  PAST MEDICAL HISTORY: Past Medical History  Diagnosis Date  . Breast cancer ([place])   . Depression   . Anxiety   . Sickle cell trait ([place])   . Obesity (BMI 35.0-39.9 without comorbidity) ([place])   . Hypertension     pt states is currently on no medications   . Seasonal allergies   . GERD (gastroesophageal reflux disease)   . Termination of pregnancy (fetus) 04/02/16    PAST SURGICAL HISTORY: Past Surgical History  Procedure Laterality Date  . Cesarean section      2004 and 2007    FAMILY HISTORY Family History  Problem Relation Age of Onset  . Hypertension Mother   . Uterine cancer Mother   . Cancer Father   . Hypertension Father   . Breast cancer Maternal Aunt   . Prostate cancer Maternal Uncle   . Breast cancer Maternal Grandmother   . Throat cancer Maternal Grandfather   . Breast cancer Cousin   The patient has very little information about her father. Her mother is currently 67 years old. She had a history of cervical cancer at age 45. The patient had 2 brothers, no sisters. The  maternal grandfather had throat cancer. A maternal uncle was diagnosed with breast cancer as well as prostate cancer at the age of 3. 2 maternal cousins, one of the mail, had breast cancer as well.  GYNECOLOGIC HISTORY:  No LMP recorded. Menarche age 81, first live birth age 73.  The patient is GX P4. She still having regular periods. She took oral contraceptives in the 1990s with no side effects.  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 The patient's significant other [name] works at [employer]. In addition to them the patient's 3 children Chasmine Chinle, [place] and [name] are also in the home. There are age 37, 67, and 46. The patient's son [name], currently 13 years old, lives in [place].    ADVANCED DIRECTIVES: Not in place   HEALTH MAINTENANCE: Social History  Substance Use Topics  . Smoking status: Current Every Day Smoker -- 0.25 packs/day for 20 years    Types: Cigarettes  . Smokeless tobacco: Never Used  . Alcohol Use: Yes     Comment: occ     Colonoscopy:  PAP:  Bone density:  Lipid panel:  No Known Allergies  Current Outpatient Prescriptions  Medication Sig Dispense Refill  . dexamethasone (DECADRON) 4 MG tablet Take 2 tablets by mouth once a day on the day after chemotherapy and then take 2 tablets two times a day for 2 days. Take with food. 30 tablet 1  . lidocaine-prilocaine (EMLA) cream Apply to affected area once 30 g 3  . omeprazole (PRILOSEC) 20 MG capsule Take 1 capsule (20 mg total) by mouth daily. 30 capsule 3  . ondansetron (ZOFRAN) 8 MG tablet Take 1 tablet (8 mg total) by mouth 2 (two) times daily as needed. Start on the third day after chemotherapy. 30 tablet 1  . prochlorperazine (COMPAZINE) 10 MG tablet Take 1 tablet (10 mg total) by mouth every 6 (six) hours as needed (Nausea or vomiting). 30 tablet 1  . cholestyramine (QUESTRAN) 4 g packet Take 1 packet (4 g total) by mouth 3 (three) times daily with meals. (Patient not taking: Reported on 04/28/2016) 60 each 0  . LORazepam (ATIVAN) 0.5 MG tablet Take 1 tablet (0.5 mg total) by mouth at bedtime. 30 tablet 0  . OVER THE COUNTER MEDICATION Take 1 Dose by mouth every other day.  Reported on 05/12/2016     No current facility-administered medications for this visit.    OBJECTIVE: [name] Who appears stated age 40 Vitals:   05/12/16 1027  BP: 117/84  Pulse: 80  Temp: 98.2 F (36.8 C)  Resp: 18     Body mass index is 34.25 kg/(m^2).    ECOG FS:1 - Symptomatic but completely ambulatory   Skin: warm, dry  HEENT: sclerae anicteric, conjunctivae pink, oropharynx clear. No thrush or mucositis.  Lymph Nodes: No cervical or supraclavicular lymphadenopathy  Lungs: clear to auscultation bilaterally, no rales, wheezes, or rhonci  Heart: regular rate and rhythm  Abdomen: round, soft, non tender, positive bowel sounds  Musculoskeletal: No focal spinal tenderness, no peripheral edema  Neuro: non focal, well oriented, positive affect  Breasts: deferred  LAB RESULTS:  CMP     Component Value Date/Time   NA 140 05/05/2016 0940   NA 137 04/11/2016 1045   K 3.5 05/05/2016 0940   K 3.4* 04/11/2016 1045   CL 106 04/11/2016 1045   CO2  28 05/05/2016 0940   CO2 25 04/11/2016 1045   GLUCOSE 100 05/05/2016 0940   GLUCOSE 84 04/11/2016 1045   BUN 5.7* 05/05/2016 0940   BUN 5* 04/11/2016 1045   CREATININE 0.8 05/05/2016 0940   CREATININE 0.78 04/11/2016 1045   CREATININE 0.75 01/12/2016 1115   CALCIUM 9.3 05/05/2016 0940   CALCIUM 8.6* 04/11/2016 1045   PROT 6.9 05/05/2016 0940   PROT 7.1 01/12/2016 1115   ALBUMIN 3.6 05/05/2016 0940   ALBUMIN 4.2 01/12/2016 1115   AST 9 05/05/2016 0940   AST 12 01/12/2016 1115   ALT <9 05/05/2016 0940   ALT 8 01/12/2016 1115   ALKPHOS 89 05/05/2016 0940   ALKPHOS 47 01/12/2016 1115   BILITOT 0.40 05/05/2016 0940   BILITOT 0.4 01/12/2016 1115   GFRNONAA >60 04/11/2016 1045   GFRNONAA >89 01/12/2016 1115   GFRAA >60 04/11/2016 1045   GFRAA >89 01/12/2016 1115    INo results found for: SPEP, UPEP  Lab Results  Component Value Date   WBC 12.3* 05/12/2016   NEUTROABS 9.0* 05/12/2016   HGB 10.8*  05/12/2016   HCT 32.4* 05/12/2016   MCV 91.0 05/12/2016   PLT 171 05/12/2016      Chemistry      Component Value Date/Time   NA 140 05/05/2016 0940   NA 137 04/11/2016 1045   K 3.5 05/05/2016 0940   K 3.4* 04/11/2016 1045   CL 106 04/11/2016 1045   CO2 28 05/05/2016 0940   CO2 25 04/11/2016 1045   BUN 5.7* 05/05/2016 0940   BUN 5* 04/11/2016 1045   CREATININE 0.8 05/05/2016 0940   CREATININE 0.78 04/11/2016 1045   CREATININE 0.75 01/12/2016 1115      Component Value Date/Time   CALCIUM 9.3 05/05/2016 0940   CALCIUM 8.6* 04/11/2016 1045   ALKPHOS 89 05/05/2016 0940   ALKPHOS 47 01/12/2016 1115   AST 9 05/05/2016 0940   AST 12 01/12/2016 1115   ALT <9 05/05/2016 0940   ALT 8 01/12/2016 1115   BILITOT 0.40 05/05/2016 0940   BILITOT 0.4 01/12/2016 1115       No results found for: LABCA2  No components found for: LABCA125  No results for input(s): INR in the last 168 hours.  Urinalysis    Component Value Date/Time   LABSPEC 1.020 01/12/2016 1122   PHURINE 5.5 01/12/2016 1122   GLUCOSEU NEGATIVE 01/12/2016 1122   HGBUR TRACE* 01/12/2016 1122   BILIRUBINUR NEGATIVE 01/12/2016 1122   KETONESUR NEGATIVE 01/12/2016 1122   PROTEINUR NEGATIVE 01/12/2016 1122   UROBILINOGEN 0.2 01/12/2016 1122   NITRITE NEGATIVE 01/12/2016 1122   LEUKOCYTESUR NEGATIVE 01/12/2016 1122    ELIGIBLE FOR AVAILABLE RESEARCH PROTOCOL: PREVENT?  STUDIES: Mr Breast Bilateral W Wo Contrast  05/09/2016  CLINICAL DATA:  40 year old female with biopsy proven invasive ductal carcinoma in the right breast. Patient's MRI was performed on 04/26/2016. Multiple attempts were made to contact the patient for repeat imaging secondary to motion. The patient never returned phone calls for repeat imaging. Leo Rod, nurse practitioner working with Dr. [name] was contacted about the repeat imaging, she states that the patient is having another MRI in 3 weeks following neoadjuvant chemotherapy. The  patient will not return for repeat imaging until that time. LABS:  None obtained at the time of imaging. EXAM: BILATERAL BREAST MRI WITH AND WITHOUT CONTRAST TECHNIQUE: Multiplanar, multisequence MR images of both breasts were obtained prior to and following the intravenous administration of 16 ml of MultiHance.  THREE-DIMENSIONAL MR IMAGE RENDERING ON INDEPENDENT WORKSTATION: Three-dimensional MR images were rendered by post-processing of the original MR data on an independent workstation. The three-dimensional MR images were interpreted, and findings are reported in the following complete MRI report for this study. Three dimensional images were evaluated at the independent Dyna[employer]ad workstation COMPARISON:  Previous exam(s). FINDINGS: The exam is sub optimal secondary to motion. Motion is more pronounced with the left breast. Breast composition: b. Scattered fibroglandular tissue. Background parenchymal enhancement: Moderate. Right breast: There is a 3.0 x 2.7 x 3.0 cm irregular enhancing mass in the central right breast. There is a signal void artifact in the mass corresponding with the biopsy clip. Left breast: Imaging of the left breast is sub optimal secondary to motion. There is a left-sided Port-A-Cath. No suspicious mass or abnormal enhancement seen on the sub optimal images. Lymph nodes: Two level I right axillary lymph nodes measuring 1.1 and 1.3 cm are seen. The cortices seen mildly prominent. A right axillary lymph node was biopsied on 01/28/2016 and was benign. Ancillary findings:  None. IMPRESSION: 3.0 cm mass in the central right breast corresponding with the biopsy proven invasive ductal carcinoma. Sub optimal imaging of the left breast secondary to motion. The patient will have a repeat MRI following neoadjuvant chemotherapy. RECOMMENDATION: Treatment planning of the known right breast invasive ductal carcinoma is recommended. BI-RADS CATEGORY  6: Known biopsy-proven malignancy. Electronically Signed    By: Lillia Mountain M.D.   On: 05/09/2016 13:08    ASSESSMENT: 40 y.o. [place] woman status post right breast biopsy 01/28/2016 for a clinical T2 N0 invasive ductal carcinoma, grade 3, triple negative, with an MIB-1 of 70%.  (a) suspicious right axillary lymph node biopsied 01/28/2016 was benign  (1) neoadjuvant chemotherapy: doxorubicin and cyclophosphamide in dose dense fashion 4 started 04/14/16 to be followed by paclitaxel and carboplatin weekly 12  (2) genetics testing is pending  (3) definitive surgery to follow  (5) adjuvant radiation to follow as appropriate  PLAN: Reigan has no new issues this week. I refilled her ativan. She is going to try this in combination with melatonin for better rest. The labs were reviewed in detail were stable. She will proceed with cycle 3 of doxorubicin and cyclophosphamide as planned today.   Kharma will return in 1 week for labs and a nadir visit. She knows the goal of treatment in her case is cure. She has been encouraged to call with any issues that might arise before her next visit here.   Laurie Panda, NP   05/12/2016 10:52 AM

## 2016-05-12 NOTE — Patient Instructions (Signed)
[facility] Discharge Instructions for Patients Receiving Chemotherapy  Today you received the following chemotherapy agents: Adriamycin and Cytoxan.  To help prevent nausea and vomiting after your treatment, we encourage you to take your nausea medication: Compazine 10 mg every 6 hours as neede; Zofran 8 mg every 12 hours as needed.   If you develop nausea and vomiting that is not controlled by your nausea medication, call the clinic.   BELOW ARE SYMPTOMS THAT SHOULD BE REPORTED IMMEDIATELY:  *FEVER GREATER THAN 100.5 F  *CHILLS WITH OR WITHOUT FEVER  NAUSEA AND VOMITING THAT IS NOT CONTROLLED WITH YOUR NAUSEA MEDICATION  *UNUSUAL SHORTNESS OF BREATH  *UNUSUAL BRUISING OR BLEEDING  TENDERNESS IN MOUTH AND THROAT WITH OR WITHOUT PRESENCE OF ULCERS  *URINARY PROBLEMS  *BOWEL PROBLEMS  UNUSUAL RASH Items with * indicate a potential emergency and should be followed up as soon as possible.  Feel free to call the clinic you have any questions or concerns. The clinic phone number is [phone].  Please show the [facility] at check-in to the Emergency Department and triage nurse.

## 2016-05-19 ENCOUNTER — Other Ambulatory Visit: Payer: Medicaid Other

## 2016-05-19 ENCOUNTER — Ambulatory Visit: Payer: Medicaid Other | Admitting: Nurse Practitioner

## 2016-05-19 ENCOUNTER — Other Ambulatory Visit: Payer: Self-pay | Admitting: Nurse Practitioner

## 2016-05-19 ENCOUNTER — Telehealth: Payer: Self-pay | Admitting: *Deleted

## 2016-05-19 DIAGNOSIS — C50411 Malignant neoplasm of upper-outer quadrant of right female breast: Secondary | ICD-10-CM

## 2016-05-19 NOTE — Telephone Encounter (Signed)
Called pt to inquire about missed appt for today. Pt was to have labs and see provider today. No answer but left a detailed message for pt to call this nurse back at [phone]. Message to be fwd to H. Boelter,NP

## 2016-05-23 NOTE — Telephone Encounter (Signed)
Called pt and LM to return call just wanted to check on her in regards to prenatal care.

## 2016-05-26 ENCOUNTER — Telehealth: Payer: Self-pay | Admitting: Oncology

## 2016-05-26 ENCOUNTER — Other Ambulatory Visit (HOSPITAL_BASED_OUTPATIENT_CLINIC_OR_DEPARTMENT_OTHER): Payer: Medicaid Other

## 2016-05-26 ENCOUNTER — Ambulatory Visit (HOSPITAL_BASED_OUTPATIENT_CLINIC_OR_DEPARTMENT_OTHER): Payer: Medicaid Other

## 2016-05-26 ENCOUNTER — Ambulatory Visit (HOSPITAL_BASED_OUTPATIENT_CLINIC_OR_DEPARTMENT_OTHER): Payer: Medicaid Other | Admitting: Oncology

## 2016-05-26 VITALS — BP 115/81 | HR 88 | Temp 98.4°F | Resp 18 | Ht 61.5 in | Wt 181.4 lb

## 2016-05-26 DIAGNOSIS — C50411 Malignant neoplasm of upper-outer quadrant of right female breast: Secondary | ICD-10-CM

## 2016-05-26 DIAGNOSIS — Z170 Estrogen receptor positive status [ER+]: Secondary | ICD-10-CM

## 2016-05-26 DIAGNOSIS — M545 Low back pain: Secondary | ICD-10-CM

## 2016-05-26 DIAGNOSIS — C50412 Malignant neoplasm of upper-outer quadrant of left female breast: Secondary | ICD-10-CM

## 2016-05-26 DIAGNOSIS — Z5111 Encounter for antineoplastic chemotherapy: Secondary | ICD-10-CM | POA: Diagnosis not present

## 2016-05-26 DIAGNOSIS — R110 Nausea: Secondary | ICD-10-CM

## 2016-05-26 DIAGNOSIS — F418 Other specified anxiety disorders: Secondary | ICD-10-CM | POA: Diagnosis not present

## 2016-05-26 DIAGNOSIS — Z720 Tobacco use: Secondary | ICD-10-CM | POA: Diagnosis not present

## 2016-05-26 LAB — CBC WITH DIFFERENTIAL/PLATELET
BASO%: 0.6 % (ref 0.0–2.0)
BASOS ABS: 0 10*3/uL (ref 0.0–0.1)
EOS%: 0.8 % (ref 0.0–7.0)
Eosinophils Absolute: 0.1 10*3/uL (ref 0.0–0.5)
HEMATOCRIT: 30.8 % — AB (ref 34.8–46.6)
HEMOGLOBIN: 10.3 g/dL — AB (ref 11.6–15.9)
LYMPH#: 1.2 10*3/uL (ref 0.9–3.3)
LYMPH%: 16.4 % (ref 14.0–49.7)
MCH: 30.8 pg (ref 25.1–34.0)
MCHC: 33.5 g/dL (ref 31.5–36.0)
MCV: 91.9 fL (ref 79.5–101.0)
MONO#: 0.8 10*3/uL (ref 0.1–0.9)
MONO%: 10.8 % (ref 0.0–14.0)
NEUT#: 5.2 10*3/uL (ref 1.5–6.5)
NEUT%: 71.4 % (ref 38.4–76.8)
Platelets: 209 10*3/uL (ref 145–400)
RBC: 3.35 10*6/uL — ABNORMAL LOW (ref 3.70–5.45)
RDW: 15.9 % — AB (ref 11.2–14.5)
WBC: 7.3 10*3/uL (ref 3.9–10.3)

## 2016-05-26 LAB — COMPREHENSIVE METABOLIC PANEL
ALBUMIN: 3.7 g/dL (ref 3.5–5.0)
ALK PHOS: 61 U/L (ref 40–150)
ALT: <9 U/L (ref 0–55)
ANION GAP: 8 meq/L (ref 3–11)
AST: 10 U/L (ref 5–34)
BUN: 4.9 mg/dL — AB (ref 7.0–26.0)
CALCIUM: 9.1 mg/dL (ref 8.4–10.4)
CO2: 25 mEq/L (ref 22–29)
Chloride: 109 mEq/L (ref 98–109)
Creatinine: 0.8 mg/dL (ref 0.6–1.1)
Glucose: 123 mg/dl (ref 70–140)
POTASSIUM: 3.4 meq/L — AB (ref 3.5–5.1)
Sodium: 143 mEq/L (ref 136–145)
Total Bilirubin: <0.3 mg/dL (ref 0.20–1.20)
Total Protein: 6.9 g/dL (ref 6.4–8.3)

## 2016-05-26 MED ORDER — SODIUM CHLORIDE 0.9 % IV SOLN
600.0000 mg/m2 | Freq: Once | INTRAVENOUS | Status: AC
  Administered 2016-05-26: 1140 mg via INTRAVENOUS
  Filled 2016-05-26: qty 57

## 2016-05-26 MED ORDER — PALONOSETRON HCL INJECTION 0.25 MG/5ML
0.2500 mg | Freq: Once | INTRAVENOUS | Status: AC
  Administered 2016-05-26: 0.25 mg via INTRAVENOUS

## 2016-05-26 MED ORDER — FOSAPREPITANT DIMEGLUMINE INJECTION 150 MG
Freq: Once | INTRAVENOUS | Status: AC
  Administered 2016-05-26: 12:00:00 via INTRAVENOUS
  Filled 2016-05-26: qty 5

## 2016-05-26 MED ORDER — DOXORUBICIN HCL CHEMO IV INJECTION 2 MG/ML
60.0000 mg/m2 | Freq: Once | INTRAVENOUS | Status: AC
  Administered 2016-05-26: 114 mg via INTRAVENOUS
  Filled 2016-05-26: qty 57

## 2016-05-26 MED ORDER — PALONOSETRON HCL INJECTION 0.25 MG/5ML
INTRAVENOUS | Status: AC
  Filled 2016-05-26: qty 5

## 2016-05-26 MED ORDER — SODIUM CHLORIDE 0.9 % IV SOLN
Freq: Once | INTRAVENOUS | Status: AC
  Administered 2016-05-26: 12:00:00 via INTRAVENOUS

## 2016-05-26 MED ORDER — SODIUM CHLORIDE 0.9% FLUSH
10.0000 mL | INTRAVENOUS | Status: DC | PRN
  Administered 2016-05-26: 10 mL
  Filled 2016-05-26: qty 10

## 2016-05-26 MED ORDER — PEGFILGRASTIM 6 MG/0.6ML ~~LOC~~ PSKT
6.0000 mg | PREFILLED_SYRINGE | Freq: Once | SUBCUTANEOUS | Status: AC
  Administered 2016-05-26: 6 mg via SUBCUTANEOUS
  Filled 2016-05-26: qty 0.6

## 2016-05-26 MED ORDER — HEPARIN SOD (PORK) LOCK FLUSH 100 UNIT/ML IV SOLN
500.0000 [IU] | Freq: Once | INTRAVENOUS | Status: AC | PRN
  Administered 2016-05-26: 500 [IU]
  Filled 2016-05-26: qty 5

## 2016-05-26 NOTE — Progress Notes (Signed)
[place]  [phone] [phone]   ID: Nimco Bivens DOB: June 25, 1976  MR#: [id number]  WPY#:[id number]  Patient Care Team: Dorena Dew, FNP as PCP - General (Family Medicine) Alphonsa Overall, MD as Consulting Physician (General Surgery) Chauncey Cruel, MD as Consulting Physician (Oncology) Gery Pray, MD as Consulting Physician (Radiation Oncology) Sylvan Cheese, NP as Nurse Practitioner (Hematology and Oncology) PCP: Dorena Dew, FNP GYN: OTHER MD:  CHIEF COMPLAINT: triple negative breast cancer  CURRENT TREATMENT: neoadjuvant chemotherapy  BREAST CANCER HISTORY: From the original intake note:  Telly herself noted a change in her right breast sometime around September or October 2016. She did not bring it to intermediate medical attention, but on 01/13/2016 she established herself in Dr. Smith Robert' service and she was set up for bilateral diagnostic mammography with tomosynthesis and bilateral ultrasonography at the [facility] 01/19/2016. The breast density was category B. In the upper outer quadrant of the right breast there was a spiculated mass measuring 2.8 cm. On physical exam this was palpable. Targeted ultrasonography confirmed an irregular hypoechoic mass in the right breast 11:30 o'clock position measuring 2.6 cm maximally. Ultrasound of the right axilla showed a morphologically abnormal lymph node.  In the left breast there were some tubular densities behind the areola which by ultrasonography showed benign ductal ectasia.  On 01/28/2016 Jyll underwent biopsy of the right breast mass and abnormal right axillary lymph node. The pathology from this procedure (S AAA [id number]) showed the lymph node to be benign. In the breast however there was an invasive ductal carcinoma, grade 3, which was estrogen and progesterone receptor negative. The proliferation marker was 70%. HER-2 was not amplified with a signals ratio of 1.32. The  number per cell was 2.05.  The patient's subsequent history is as detailed below  INTERVAL HISTORY: Comfort returns today for  For follow-up of her triple negative breast cancer, which we are treating neoadjuvantly. Today is day 1, cycle 4 of doxorubicin and cyclophosphamide, with neulasta onpro for granulocyte support. That will be followed by weekly paclitaxel and carboplatin 12  She had a breast MRI on  04/26/2016, after her first dose of chemotherapy, which showed a 3 cm mass but no suspicious lymphadenopathy. Recall the mass that measured 2.8 cm by ultrasonography in February.  Of course we would not have expected to see any significant treatment effect after only 1 dose of chemotherapy. The MRI also saw 2 small lymph nodes in the left axilla which had slightly prominent cortices.  REVIEW OF SYSTEMS: [place]  Continues to have regular periods and has not had any problems with hot flashes so far. She has nausea and rare vomiting. She is not taking any antiemetics because she says they make her vomit. She has some pain in her legs hips and back, which is not new. She says her appetite is poor, but she keeps herself well hydrated. She has chronic eczema problems. She feels anxious and depressed. A detailed review of systems today was otherwise stable.  PAST MEDICAL HISTORY: Past Medical History  Diagnosis Date  . Breast cancer ([place])   . Depression   . Anxiety   . Sickle cell trait ([place])   . Obesity (BMI 35.0-39.9 without comorbidity) ([place])   . Hypertension     pt states is currently on no medications   . Seasonal allergies   . GERD (gastroesophageal reflux disease)   . Termination of pregnancy (fetus) 04/02/16    PAST SURGICAL HISTORY: Past Surgical History  Procedure Laterality Date  . Cesarean section      2004 and 2007    FAMILY HISTORY Family History  Problem Relation Age of Onset  . Hypertension Mother   . Uterine cancer Mother   . Cancer Father   . Hypertension Father   .  Breast cancer Maternal Aunt   . Prostate cancer Maternal Uncle   . Breast cancer Maternal Grandmother   . Throat cancer Maternal Grandfather   . Breast cancer Cousin   The patient has very little information about her father. Her mother is currently 37 years old. She had a history of cervical cancer at age 16. The patient had 2 brothers, no sisters. The maternal grandfather had throat cancer. A maternal uncle was diagnosed with breast cancer as well as prostate cancer at the age of 18. 2 maternal cousins, one of the mail, had breast cancer as well.  GYNECOLOGIC HISTORY:  No LMP recorded. Menarche age 56, first live birth age 45. The patient is GX P4. She still having regular periods. She took oral contraceptives in the 1990s with no side effects.  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 The patient's significant other [name] works at [employer]. In addition to them the patient's 3 children Chasmine Seguin, [place] and [name] are also in the home. There are age 29, 29, and 65. The patient's son [name], currently 61 years old, lives in [place].    ADVANCED DIRECTIVES: Not in place   HEALTH MAINTENANCE: Social History  Substance Use Topics  . Smoking status: Current Every Day Smoker -- 0.25 packs/day for 20 years    Types: Cigarettes  . Smokeless tobacco: Never Used  . Alcohol Use: Yes     Comment: occ     Colonoscopy:  PAP:  Bone density:  Lipid panel:  No Known Allergies  Current Outpatient Prescriptions  Medication Sig Dispense Refill  . lidocaine-prilocaine (EMLA) cream Apply to affected area once 30 g 3  . LORazepam (ATIVAN) 0.5 MG tablet Take 1 tablet (0.5 mg total) by mouth at bedtime. 30 tablet 0  . omeprazole (PRILOSEC) 20 MG capsule Take 1 capsule (20 mg total) by mouth daily. 30 capsule 3  . prochlorperazine (COMPAZINE) 10 MG tablet Take 1 tablet (10 mg total) by mouth  every 6 (six) hours as needed (Nausea or vomiting). 30 tablet 1   No current facility-administered medications for this visit.    OBJECTIVE: [name] in no acute distress  Filed Vitals:   05/26/16 1027  BP: 115/81  Pulse: 88  Temp: 98.4 F (36.9 C)  Resp: 18     Body mass index is 33.72 kg/(m^2).    ECOG FS:1 - Symptomatic but completely ambulatory  Sclerae unicteric, EOMs intact Oropharynx clear, slightly dry No cervical or supraclavicular adenopathy Lungs no rales or rhonchi Heart regular rate and rhythm Abd soft, nontender, positive bowel sounds MSK no focal spinal tenderness, no upper extremity lymphedema Neuro: nonfocal, well oriented, appropriate affect Breasts: deferred  LAB RESULTS:  CMP     Component Value Date/Time   NA 143 05/12/2016 1010   NA 137 04/11/2016 1045   K 3.6 05/12/2016 1010   K 3.4* 04/11/2016 1045   CL 106 04/11/2016 1045   CO2 27 05/12/2016 1010   CO2 25 04/11/2016 1045   GLUCOSE 92 05/12/2016 1010   GLUCOSE 84 04/11/2016 1045  BUN 4.4* 05/12/2016 1010   BUN 5* 04/11/2016 1045   CREATININE 0.8 05/12/2016 1010   CREATININE 0.78 04/11/2016 1045   CREATININE 0.75 01/12/2016 1115   CALCIUM 9.2 05/12/2016 1010   CALCIUM 8.6* 04/11/2016 1045   PROT 7.0 05/12/2016 1010   PROT 7.1 01/12/2016 1115   ALBUMIN 3.7 05/12/2016 1010   ALBUMIN 4.2 01/12/2016 1115   AST 11 05/12/2016 1010   AST 12 01/12/2016 1115   ALT <9 05/12/2016 1010   ALT 8 01/12/2016 1115   ALKPHOS 59 05/12/2016 1010   ALKPHOS 47 01/12/2016 1115   BILITOT <0.30 05/12/2016 1010   BILITOT 0.4 01/12/2016 1115   GFRNONAA >60 04/11/2016 1045   GFRNONAA >89 01/12/2016 1115   GFRAA >60 04/11/2016 1045   GFRAA >89 01/12/2016 1115    INo results found for: SPEP, UPEP  Lab Results  Component Value Date   WBC 7.3 05/26/2016   NEUTROABS 5.2 05/26/2016   HGB 10.3* 05/26/2016   HCT 30.8* 05/26/2016   MCV 91.9 05/26/2016   PLT 209 05/26/2016       Chemistry      Component Value Date/Time   NA 143 05/12/2016 1010   NA 137 04/11/2016 1045   K 3.6 05/12/2016 1010   K 3.4* 04/11/2016 1045   CL 106 04/11/2016 1045   CO2 27 05/12/2016 1010   CO2 25 04/11/2016 1045   BUN 4.4* 05/12/2016 1010   BUN 5* 04/11/2016 1045   CREATININE 0.8 05/12/2016 1010   CREATININE 0.78 04/11/2016 1045   CREATININE 0.75 01/12/2016 1115      Component Value Date/Time   CALCIUM 9.2 05/12/2016 1010   CALCIUM 8.6* 04/11/2016 1045   ALKPHOS 59 05/12/2016 1010   ALKPHOS 47 01/12/2016 1115   AST 11 05/12/2016 1010   AST 12 01/12/2016 1115   ALT <9 05/12/2016 1010   ALT 8 01/12/2016 1115   BILITOT <0.30 05/12/2016 1010   BILITOT 0.4 01/12/2016 1115       No results found for: LABCA2  No components found for: LABCA125  No results for input(s): INR in the last 168 hours.  Urinalysis    Component Value Date/Time   LABSPEC 1.020 01/12/2016 1122   PHURINE 5.5 01/12/2016 1122   GLUCOSEU NEGATIVE 01/12/2016 1122   HGBUR TRACE* 01/12/2016 1122   BILIRUBINUR NEGATIVE 01/12/2016 1122   KETONESUR NEGATIVE 01/12/2016 1122   PROTEINUR NEGATIVE 01/12/2016 1122   UROBILINOGEN 0.2 01/12/2016 1122   NITRITE NEGATIVE 01/12/2016 1122   LEUKOCYTESUR NEGATIVE 01/12/2016 1122    ELIGIBLE FOR AVAILABLE RESEARCH PROTOCOL: PREVENT?  STUDIES: Mr Breast Bilateral W Wo Contrast  05/09/2016  CLINICAL DATA:  40 year old female with biopsy proven invasive ductal carcinoma in the right breast. Patient's MRI was performed on 04/26/2016. Multiple attempts were made to contact the patient for repeat imaging secondary to motion. The patient never returned phone calls for repeat imaging. Leo Rod, nurse practitioner working with Dr. [name] was contacted about the repeat imaging, she states that the patient is having another MRI in 3 weeks following neoadjuvant chemotherapy. The patient will not return for repeat imaging until that time. LABS:  None obtained at the time  of imaging. EXAM: BILATERAL BREAST MRI WITH AND WITHOUT CONTRAST TECHNIQUE: Multiplanar, multisequence MR images of both breasts were obtained prior to and following the intravenous administration of 16 ml of MultiHance. THREE-DIMENSIONAL MR IMAGE RENDERING ON INDEPENDENT WORKSTATION: Three-dimensional MR images were rendered by post-processing of the original MR data on an independent workstation.  The three-dimensional MR images were interpreted, and findings are reported in the following complete MRI report for this study. Three dimensional images were evaluated at the independent DynaCad workstation COMPARISON:  Previous exam(s). FINDINGS: The exam is sub optimal secondary to motion. Motion is more pronounced with the left breast. Breast composition: b. Scattered fibroglandular tissue. Background parenchymal enhancement: Moderate. Right breast: There is a 3.0 x 2.7 x 3.0 cm irregular enhancing mass in the central right breast. There is a signal void artifact in the mass corresponding with the biopsy clip. Left breast: Imaging of the left breast is sub optimal secondary to motion. There is a left-sided Port-A-Cath. No suspicious mass or abnormal enhancement seen on the sub optimal images. Lymph nodes: Two level I right axillary lymph nodes measuring 1.1 and 1.3 cm are seen. The cortices seen mildly prominent. A right axillary lymph node was biopsied on 01/28/2016 and was benign. Ancillary findings:  None. IMPRESSION: 3.0 cm mass in the central right breast corresponding with the biopsy proven invasive ductal carcinoma. Sub optimal imaging of the left breast secondary to motion. The patient will have a repeat MRI following neoadjuvant chemotherapy. RECOMMENDATION: Treatment planning of the known right breast invasive ductal carcinoma is recommended. BI-RADS CATEGORY  6: Known biopsy-proven malignancy. Electronically Signed   By: Lillia Mountain M.D.   On: 05/09/2016 13:08    ASSESSMENT: 40 y.o. [place] woman  status post right breast upper-outer quadrant biopsy 01/28/2016 for a clinical T2 N0 invasive ductal carcinoma, grade 3, triple negative, with an MIB-1 of 70%.  (a) suspicious right axillary lymph node biopsied 01/28/2016 was benign  (1) neoadjuvant chemotherapy: doxorubicin and cyclophosphamide in dose dense fashion 4 started 04/14/16, completed 05/26/2016, to be followed by paclitaxel and carboplatin weekly 12  (2) genetics testing is pending  (3) definitive surgery to follow  (5) adjuvant radiation to follow as appropriate  PLAN: [place]  Chemotherapy today. He has done relatively well on the doxorubicin/cyclophosphamide portion of her treatment. Specifically there have been no dose reductions or delays.She has had significant problems with nausea because she says the nausea medicines cause her to have nausea so she is essentially not using any nausea medicines at present  i suggested she give the prochlorperazine a second try. She can try half dose before meals for the next 2 or 3 days and see if that helps.  She is already scheduled to see me again in 1 week.At that point I will set her up for her weekly carboplatin and paclitaxel, which will begin on 06/09/2016.The plan is to try to get her through 12 of those doses, who be followed by definitive surgery.  She knows to call for any problems that may develop before her next visit here.   Chauncey Cruel, MD   05/26/2016 10:50 AM

## 2016-05-26 NOTE — Progress Notes (Signed)
Brisk blood return noted before, during and at end of Adriamycin administration. 

## 2016-05-26 NOTE — Patient Instructions (Signed)
[facility] Discharge Instructions for Patients Receiving Chemotherapy  Today you received the following chemotherapy agents Adriamycin/Cytoxan.  To help prevent nausea and vomiting after your treatment, we encourage you to take your nausea medication as prescribed.    If you develop nausea and vomiting that is not controlled by your nausea medication, call the clinic.   BELOW ARE SYMPTOMS THAT SHOULD BE REPORTED IMMEDIATELY:  *FEVER GREATER THAN 100.5 F  *CHILLS WITH OR WITHOUT FEVER  NAUSEA AND VOMITING THAT IS NOT CONTROLLED WITH YOUR NAUSEA MEDICATION  *UNUSUAL SHORTNESS OF BREATH  *UNUSUAL BRUISING OR BLEEDING  TENDERNESS IN MOUTH AND THROAT WITH OR WITHOUT PRESENCE OF ULCERS  *URINARY PROBLEMS  *BOWEL PROBLEMS  UNUSUAL RASH Items with * indicate a potential emergency and should be followed up as soon as possible.  Feel free to call the clinic you have any questions or concerns. The clinic phone number is [phone].  Please show the CHEMO ALERT CARD at check-in to the Emergency Department and triage nurse.   

## 2016-05-26 NOTE — Telephone Encounter (Signed)
appt made and avs printed °

## 2016-05-30 ENCOUNTER — Ambulatory Visit (HOSPITAL_COMMUNITY)
Admission: RE | Admit: 2016-05-30 | Discharge: 2016-05-30 | Disposition: A | Discharge status: Home / Self Care | Payer: Medicaid Other | Source: Ambulatory Visit | Attending: Family Medicine | Admitting: Family Medicine

## 2016-05-30 ENCOUNTER — Ambulatory Visit (HOSPITAL_COMMUNITY)
Admission: RE | Admit: 2016-05-30 | Discharge: 2016-05-30 | Disposition: A | Discharge status: Home / Self Care | Payer: Medicaid Other | Source: Ambulatory Visit | Attending: Cardiology | Admitting: Cardiology

## 2016-05-30 DIAGNOSIS — C50411 Malignant neoplasm of upper-outer quadrant of right female breast: Secondary | ICD-10-CM

## 2016-06-02 ENCOUNTER — Other Ambulatory Visit (HOSPITAL_BASED_OUTPATIENT_CLINIC_OR_DEPARTMENT_OTHER): Payer: Medicaid Other

## 2016-06-02 ENCOUNTER — Ambulatory Visit (HOSPITAL_BASED_OUTPATIENT_CLINIC_OR_DEPARTMENT_OTHER): Payer: Medicaid Other | Admitting: Oncology

## 2016-06-02 VITALS — BP 107/79 | HR 84 | Temp 98.4°F | Resp 18 | Ht 61.5 in | Wt 179.2 lb

## 2016-06-02 DIAGNOSIS — Z171 Estrogen receptor negative status [ER-]: Secondary | ICD-10-CM | POA: Diagnosis not present

## 2016-06-02 DIAGNOSIS — C50411 Malignant neoplasm of upper-outer quadrant of right female breast: Secondary | ICD-10-CM | POA: Diagnosis not present

## 2016-06-02 LAB — CBC WITH DIFFERENTIAL/PLATELET
BASO%: 0.1 % (ref 0.0–2.0)
BASOS ABS: 0 10*3/uL (ref 0.0–0.1)
EOS%: 1.3 % (ref 0.0–7.0)
Eosinophils Absolute: 0 10*3/uL (ref 0.0–0.5)
HEMATOCRIT: 27.4 % — AB (ref 34.8–46.6)
HGB: 9.2 g/dL — ABNORMAL LOW (ref 11.6–15.9)
LYMPH#: 0.7 10*3/uL — AB (ref 0.9–3.3)
LYMPH%: 30.9 % (ref 14.0–49.7)
MCH: 30.6 pg (ref 25.1–34.0)
MCHC: 33.6 g/dL (ref 31.5–36.0)
MCV: 91 fL (ref 79.5–101.0)
MONO#: 0.1 10*3/uL (ref 0.1–0.9)
MONO%: 6.1 % (ref 0.0–14.0)
NEUT#: 1.4 10*3/uL — ABNORMAL LOW (ref 1.5–6.5)
NEUT%: 61.6 % (ref 38.4–76.8)
PLATELETS: 125 10*3/uL — AB (ref 145–400)
RBC: 3.01 10*6/uL — AB (ref 3.70–5.45)
RDW: 16.4 % — ABNORMAL HIGH (ref 11.2–14.5)
WBC: 2.3 10*3/uL — ABNORMAL LOW (ref 3.9–10.3)

## 2016-06-02 LAB — COMPREHENSIVE METABOLIC PANEL
ALT: <9 U/L (ref 0–55)
ANION GAP: 7 meq/L (ref 3–11)
AST: 9 U/L (ref 5–34)
Albumin: 3.5 g/dL (ref 3.5–5.0)
Alkaline Phosphatase: 100 U/L (ref 40–150)
BUN: 6.7 mg/dL — ABNORMAL LOW (ref 7.0–26.0)
CALCIUM: 9.2 mg/dL (ref 8.4–10.4)
CHLORIDE: 108 meq/L (ref 98–109)
CO2: 26 meq/L (ref 22–29)
Creatinine: 0.8 mg/dL (ref 0.6–1.1)
EGFR: >90 mL/min/{1.73_m2} (ref >90)
Glucose: 87 mg/dl (ref 70–140)
POTASSIUM: 3.7 meq/L (ref 3.5–5.1)
Sodium: 141 mEq/L (ref 136–145)
Total Bilirubin: <0.3 mg/dL (ref 0.20–1.20)
Total Protein: 7 g/dL (ref 6.4–8.3)

## 2016-06-02 NOTE — Progress Notes (Signed)
[place]  [phone] [phone]) [phone]   ID: Rebecca [name] DOB: 1976/10/25  MR#: [id number]  OKH#:[id number]  Patient Care Team: Dorena Dew, FNP as PCP - General (Family Medicine) Alphonsa Overall, MD as Consulting Physician (General Surgery) Chauncey Cruel, MD as Consulting Physician (Oncology) Gery Pray, MD as Consulting Physician (Radiation Oncology) Sylvan Cheese, NP as Nurse Practitioner (Hematology and Oncology) PCP: Dorena Dew, FNP GYN: OTHER MD:  CHIEF COMPLAINT: triple negative breast cancer  CURRENT TREATMENT: neoadjuvant chemotherapy  BREAST CANCER HISTORY: From the original intake note:  Rebecca [name] herself noted a change in her right breast sometime around September or October 2016. She did not bring it to intermediate medical attention, but on 01/13/2016 she established herself in Dr. Smith Robert' service and she was set up for bilateral diagnostic mammography with tomosynthesis and bilateral ultrasonography at the [facility] 01/19/2016. The breast density was category B. In the upper outer quadrant of the right breast there was a spiculated mass measuring 2.8 cm. On physical exam this was palpable. Targeted ultrasonography confirmed an irregular hypoechoic mass in the right breast 11:30 o'clock position measuring 2.6 cm maximally. Ultrasound of the right axilla showed a morphologically abnormal lymph node.  In the left breast there were some tubular densities behind the areola which by ultrasonography showed benign ductal ectasia.  On 01/28/2016 Raylee underwent biopsy of the right breast mass and abnormal right axillary lymph node. The pathology from this procedure (S [id number]) showed the lymph node to be benign. In the breast however there was an invasive ductal carcinoma, grade 3, which was estrogen and progesterone receptor negative. The proliferation marker was 70%. HER-2 was not amplified with a signals ratio of 1.32. The  number per cell was 2.05.  The patient's subsequent history is as detailed below  INTERVAL HISTORY: Rebecca [name] returns today for follow-up of her breast cancer. Today is day 8, cycle 4 of 42 planned cycles of doxorubicin and cyclophosphamide, which she received with neulasta onpro for granulocyte support. She is now ready to start her weekly carboplatin/paclitaxel treatments.  REVIEW OF SYSTEMS: [name] did well with the first part of her chemotherapy. She did have some nausea, rare vomiting, and had significant bony pain from the Neulasta. She felt a little bit tired. Currently mild nausea is her only symptom. She has a little bit of a runny nose and some sinus symptoms. She has heartburn. She feels anxious and depressed. She is still menstruating. A detailed review of systems today was otherwise stable.  PAST MEDICAL HISTORY: Past Medical History  Diagnosis Date  . Breast cancer ([place])   . Depression   . Anxiety   . Sickle cell trait ([place])   . Obesity (BMI 35.0-39.9 without comorbidity) ([place])   . Hypertension     pt states is currently on no medications   . Seasonal allergies   . GERD (gastroesophageal reflux disease)   . Termination of pregnancy (fetus) 04/02/16    PAST SURGICAL HISTORY: Past Surgical History  Procedure Laterality Date  . Cesarean section      2004 and 2007    FAMILY HISTORY Family History  Problem Relation Age of Onset  . Hypertension Mother   . Uterine cancer Mother   . Cancer Father   . Hypertension Father   . Breast cancer Maternal Aunt   . Prostate cancer Maternal Uncle   . Breast cancer Maternal Grandmother   . Throat cancer Maternal Grandfather   . Breast cancer Cousin  The patient has very little information about her father. Her mother is currently 19 years old. She had a history of cervical cancer at age 61. The patient had 2 brothers, no sisters. The maternal grandfather had throat cancer. A maternal uncle was diagnosed with breast cancer as well as  prostate cancer at the age of 47. 2 maternal cousins, one of the mail, had breast cancer as well.  GYNECOLOGIC HISTORY:  No LMP recorded. Menarche age 73, first live birth age 43. The patient is GX P4. She still having regular periods. She took oral contraceptives in the 1990s with no side effects.  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 The patient's significant other Rebecca [name] works at [employer]. In addition to them the patient's 3 children Rebecca [name], Rebecca [name] and Rebecca [name] Current are also in the home. There are age 64, 24, and 12. The patient's son Rebecca [name], currently 27 years old, lives in [place].    ADVANCED DIRECTIVES: Not in place   HEALTH MAINTENANCE: Social History  Substance Use Topics  . Smoking status: Current Every Day Smoker -- 0.25 packs/day for 20 years    Types: Cigarettes  . Smokeless tobacco: Never Used  . Alcohol Use: Yes     Comment: occ     Colonoscopy:  PAP:  Bone density:  Lipid panel:  No Known Allergies  Current Outpatient Prescriptions  Medication Sig Dispense Refill  . lidocaine-prilocaine (EMLA) cream Apply to affected area once 30 g 3  . LORazepam (ATIVAN) 0.5 MG tablet Take 1 tablet (0.5 mg total) by mouth at bedtime. 30 tablet 0  . omeprazole (PRILOSEC) 20 MG capsule Take 1 capsule (20 mg total) by mouth daily. 30 capsule 3  . prochlorperazine (COMPAZINE) 10 MG tablet Take 1 tablet (10 mg total) by mouth every 6 (six) hours as needed (Nausea or vomiting). 30 tablet 1   No current facility-administered medications for this visit.    OBJECTIVE: [name] Who appears well Filed Vitals:   06/02/16 1113  BP: 107/79  Pulse: 84  Temp: 98.4 F (36.9 C)  Resp: 18     Body mass index is 33.32 kg/(m^2).    ECOG FS:0 - Asymptomatic  Sclerae unicteric, pupils round and equal Oropharynx clear and moist-- no thrush or other lesions No  cervical or supraclavicular adenopathy Lungs no rales or rhonchi Heart regular rate and rhythm Abd soft, nontender, positive bowel sounds MSK no focal spinal tenderness, no upper extremity lymphedema Neuro: nonfocal, well oriented, appropriate affect Breasts: Deferred    LAB RESULTS:  CMP     Component Value Date/Time   NA 143 05/26/2016 1015   NA 137 04/11/2016 1045   K 3.4* 05/26/2016 1015   K 3.4* 04/11/2016 1045   CL 106 04/11/2016 1045   CO2 25 05/26/2016 1015   CO2 25 04/11/2016 1045   GLUCOSE 123 05/26/2016 1015   GLUCOSE 84 04/11/2016 1045   BUN 4.9* 05/26/2016 1015   BUN 5* 04/11/2016 1045   CREATININE 0.8 05/26/2016 1015   CREATININE 0.78 04/11/2016 1045   CREATININE 0.75 01/12/2016 1115   CALCIUM 9.1 05/26/2016 1015   CALCIUM 8.6* 04/11/2016 1045   PROT 6.9 05/26/2016 1015   PROT 7.1 01/12/2016 1115   ALBUMIN 3.7 05/26/2016 1015   ALBUMIN 4.2 01/12/2016 1115   AST 10 05/26/2016 1015   AST 12 01/12/2016 1115   ALT <9  05/26/2016 1015   ALT 8 01/12/2016 1115   ALKPHOS 61 05/26/2016 1015   ALKPHOS 47 01/12/2016 1115   BILITOT <0.30 05/26/2016 1015   BILITOT 0.4 01/12/2016 1115   GFRNONAA >60 04/11/2016 1045   GFRNONAA >89 01/12/2016 1115   GFRAA >60 04/11/2016 1045   GFRAA >89 01/12/2016 1115    INo results found for: SPEP, UPEP  Lab Results  Component Value Date   WBC 2.3* 06/02/2016   NEUTROABS 1.4* 06/02/2016   HGB 9.2* 06/02/2016   HCT 27.4* 06/02/2016   MCV 91.0 06/02/2016   PLT 125* 06/02/2016      Chemistry      Component Value Date/Time   NA 143 05/26/2016 1015   NA 137 04/11/2016 1045   K 3.4* 05/26/2016 1015   K 3.4* 04/11/2016 1045   CL 106 04/11/2016 1045   CO2 25 05/26/2016 1015   CO2 25 04/11/2016 1045   BUN 4.9* 05/26/2016 1015   BUN 5* 04/11/2016 1045   CREATININE 0.8 05/26/2016 1015   CREATININE 0.78 04/11/2016 1045   CREATININE 0.75 01/12/2016 1115      Component Value Date/Time   CALCIUM 9.1 05/26/2016 1015    CALCIUM 8.6* 04/11/2016 1045   ALKPHOS 61 05/26/2016 1015   ALKPHOS 47 01/12/2016 1115   AST 10 05/26/2016 1015   AST 12 01/12/2016 1115   ALT <9 05/26/2016 1015   ALT 8 01/12/2016 1115   BILITOT <0.30 05/26/2016 1015   BILITOT 0.4 01/12/2016 1115       No results found for: LABCA2  No components found for: FMBBU037  No results for input(s): INR in the last 168 hours.  Urinalysis    Component Value Date/Time   LABSPEC 1.020 01/12/2016 1122   PHURINE 5.5 01/12/2016 1122   GLUCOSEU NEGATIVE 01/12/2016 1122   HGBUR TRACE* 01/12/2016 1122   BILIRUBINUR NEGATIVE 01/12/2016 1122   KETONESUR NEGATIVE 01/12/2016 1122   PROTEINUR NEGATIVE 01/12/2016 1122   UROBILINOGEN 0.2 01/12/2016 1122   NITRITE NEGATIVE 01/12/2016 1122   LEUKOCYTESUR NEGATIVE 01/12/2016 1122    ELIGIBLE FOR AVAILABLE RESEARCH PROTOCOL: PREVENT?  STUDIES: No results found.  ASSESSMENT: 40 y.o. [place] woman status post right breast upper-outer quadrant biopsy 01/28/2016 for a clinical T2 N0 invasive ductal carcinoma, grade 3, triple negative, with an MIB-1 of 70%.  (a) suspicious right axillary lymph node biopsied 01/28/2016 was benign  (1) neoadjuvant chemotherapy: doxorubicin and cyclophosphamide in dose dense fashion 4 started 04/14/16, completed 05/26/2016, followed by paclitaxel and carboplatin weekly 12,   (2) genetics testing is pending  (3) definitive surgery to follow  (5) adjuvant radiation to follow as appropriate  PLAN: Marijo Has completed the first part of her chemotherapy without unusual toxicities or complications. She has continued to menstruate.  She is now ready to start part 2 of her treatment. This will consist of carboplatin and paclitaxel given weekly for 12 weeks. We discussed the possible toxicities, side effects and complications of these agents and I particularly warned her regarding the neuropathy question. Since she already has some numbness in both hands (only due to  early carpal tunnel problems) this will bear watching.  Is significant neuropathy develops we will switch from Taxol to gemcitabine.  Otherwise she has a good understanding of the overall plan, and agrees with it. She will receive her first treatment 06/09/2016. She will see me the following week. She knows to call for any problems that may develop before then.     Chauncey Cruel, MD  06/02/2016 11:17 AM

## 2016-06-09 ENCOUNTER — Other Ambulatory Visit (HOSPITAL_BASED_OUTPATIENT_CLINIC_OR_DEPARTMENT_OTHER): Payer: Medicaid Other

## 2016-06-09 ENCOUNTER — Ambulatory Visit (HOSPITAL_BASED_OUTPATIENT_CLINIC_OR_DEPARTMENT_OTHER): Payer: Medicaid Other

## 2016-06-09 VITALS — BP 107/69 | HR 76 | Temp 98.3°F | Resp 17

## 2016-06-09 DIAGNOSIS — Z5111 Encounter for antineoplastic chemotherapy: Secondary | ICD-10-CM | POA: Diagnosis not present

## 2016-06-09 DIAGNOSIS — C50411 Malignant neoplasm of upper-outer quadrant of right female breast: Secondary | ICD-10-CM

## 2016-06-09 LAB — COMPREHENSIVE METABOLIC PANEL
ANION GAP: 8 meq/L (ref 3–11)
AST: 11 U/L (ref 5–34)
Albumin: 3.8 g/dL (ref 3.5–5.0)
Alkaline Phosphatase: 75 U/L (ref 40–150)
BUN: 7.5 mg/dL (ref 7.0–26.0)
CALCIUM: 9.4 mg/dL (ref 8.4–10.4)
CHLORIDE: 109 meq/L (ref 98–109)
CO2: 26 meq/L (ref 22–29)
CREATININE: 0.8 mg/dL (ref 0.6–1.1)
EGFR: >90 mL/min/{1.73_m2} (ref >90)
Glucose: 103 mg/dl (ref 70–140)
POTASSIUM: 3.5 meq/L (ref 3.5–5.1)
Sodium: 144 mEq/L (ref 136–145)
Total Bilirubin: <0.3 mg/dL (ref 0.20–1.20)
Total Protein: 7.3 g/dL (ref 6.4–8.3)

## 2016-06-09 LAB — CBC WITH DIFFERENTIAL/PLATELET
BASO%: 0.3 % (ref 0.0–2.0)
BASOS ABS: 0 10*3/uL (ref 0.0–0.1)
EOS%: 0.6 % (ref 0.0–7.0)
Eosinophils Absolute: 0.1 10*3/uL (ref 0.0–0.5)
HEMATOCRIT: 29 % — AB (ref 34.8–46.6)
HGB: 10 g/dL — ABNORMAL LOW (ref 11.6–15.9)
LYMPH%: 12.2 % — AB (ref 14.0–49.7)
MCH: 31 pg (ref 25.1–34.0)
MCHC: 34.5 g/dL (ref 31.5–36.0)
MCV: 89.8 fL (ref 79.5–101.0)
MONO#: 1.5 10*3/uL — ABNORMAL HIGH (ref 0.1–0.9)
MONO%: 12.7 % (ref 0.0–14.0)
NEUT#: 9 10*3/uL — ABNORMAL HIGH (ref 1.5–6.5)
NEUT%: 74.2 % (ref 38.4–76.8)
Platelets: 167 10*3/uL (ref 145–400)
RBC: 3.23 10*6/uL — AB (ref 3.70–5.45)
RDW: 17.7 % — ABNORMAL HIGH (ref 11.2–14.5)
WBC: 12.1 10*3/uL — ABNORMAL HIGH (ref 3.9–10.3)
lymph#: 1.5 10*3/uL (ref 0.9–3.3)

## 2016-06-09 MED ORDER — FAMOTIDINE IN NACL 20-0.9 MG/50ML-% IV SOLN
INTRAVENOUS | Status: AC
  Filled 2016-06-09: qty 50

## 2016-06-09 MED ORDER — SODIUM CHLORIDE 0.9 % IV SOLN
285.2000 mg | Freq: Once | INTRAVENOUS | Status: AC
  Administered 2016-06-09: 290 mg via INTRAVENOUS
  Filled 2016-06-09: qty 29

## 2016-06-09 MED ORDER — SODIUM CHLORIDE 0.9% FLUSH
10.0000 mL | INTRAVENOUS | Status: DC | PRN
  Administered 2016-06-09: 10 mL
  Filled 2016-06-09: qty 10

## 2016-06-09 MED ORDER — SODIUM CHLORIDE 0.9 % IV SOLN
80.0000 mg/m2 | Freq: Once | INTRAVENOUS | Status: AC
  Administered 2016-06-09: 150 mg via INTRAVENOUS
  Filled 2016-06-09: qty 25

## 2016-06-09 MED ORDER — FAMOTIDINE IN NACL 20-0.9 MG/50ML-% IV SOLN
20.0000 mg | Freq: Once | INTRAVENOUS | Status: AC
  Administered 2016-06-09: 20 mg via INTRAVENOUS

## 2016-06-09 MED ORDER — DEXAMETHASONE SODIUM PHOSPHATE 100 MG/10ML IJ SOLN
20.0000 mg | Freq: Once | INTRAMUSCULAR | Status: AC
  Administered 2016-06-09: 20 mg via INTRAVENOUS
  Filled 2016-06-09: qty 2

## 2016-06-09 MED ORDER — DIPHENHYDRAMINE HCL 50 MG/ML IJ SOLN
INTRAMUSCULAR | Status: AC
  Filled 2016-06-09: qty 1

## 2016-06-09 MED ORDER — SODIUM CHLORIDE 0.9 % IV SOLN
Freq: Once | INTRAVENOUS | Status: AC
  Administered 2016-06-09: 13:00:00 via INTRAVENOUS

## 2016-06-09 MED ORDER — HEPARIN SOD (PORK) LOCK FLUSH 100 UNIT/ML IV SOLN
500.0000 [IU] | Freq: Once | INTRAVENOUS | Status: AC | PRN
  Administered 2016-06-09: 500 [IU]
  Filled 2016-06-09: qty 5

## 2016-06-09 MED ORDER — DIPHENHYDRAMINE HCL 50 MG/ML IJ SOLN
25.0000 mg | Freq: Once | INTRAMUSCULAR | Status: AC
  Administered 2016-06-09: 25 mg via INTRAVENOUS

## 2016-06-09 MED ORDER — PALONOSETRON HCL INJECTION 0.25 MG/5ML: 0.2500 mg | Freq: Once | INTRAVENOUS | Status: DC

## 2016-06-09 MED ORDER — PALONOSETRON HCL INJECTION 0.25 MG/5ML
INTRAVENOUS | Status: AC
  Filled 2016-06-09: qty 5

## 2016-06-09 NOTE — Progress Notes (Signed)
PT refused Aloxi "I do not take any nausea medicine, it makes me worse".  Val RN aware and will let MD Magrinat know. Pt does aware she has nausea meds at home and she was educated on what to take at home if needed.

## 2016-06-09 NOTE — Patient Instructions (Addendum)
[facility] Discharge Instructions for Patients Receiving Chemotherapy  Today you received the following chemotherapy agents Taxol, Carboplatin  To help prevent nausea and vomiting after your treatment, we encourage you to take your nausea medication    If you develop nausea and vomiting that is not controlled by your nausea medication, call the clinic.   BELOW ARE SYMPTOMS THAT SHOULD BE REPORTED IMMEDIATELY:  *FEVER GREATER THAN 100.5 F  *CHILLS WITH OR WITHOUT FEVER  NAUSEA AND VOMITING THAT IS NOT CONTROLLED WITH YOUR NAUSEA MEDICATION  *UNUSUAL SHORTNESS OF BREATH  *UNUSUAL BRUISING OR BLEEDING  TENDERNESS IN MOUTH AND THROAT WITH OR WITHOUT PRESENCE OF ULCERS  *URINARY PROBLEMS  *BOWEL PROBLEMS  UNUSUAL RASH Items with * indicate a potential emergency and should be followed up as soon as possible.  Feel free to call the clinic you have any questions or concerns. The clinic phone number is [phone].  Please show the [facility] at check-in to the Emergency Department and triage nurse.  Paclitaxel injection What is this medicine? PACLITAXEL (PAK li TAX el) is a chemotherapy drug. It targets fast dividing cells, like cancer cells, and causes these cells to die. This medicine is used to treat ovarian cancer, breast cancer, and other cancers. This medicine may be used for other purposes; ask your health care provider or pharmacist if you have questions. What should I tell my health care provider before I take this medicine? They need to know if you have any of these conditions: -blood disorders -irregular heartbeat -infection (especially a virus infection such as chickenpox, cold sores, or herpes) -liver disease -previous or ongoing radiation therapy -an unusual or allergic reaction to paclitaxel, alcohol, polyoxyethylated castor oil, other chemotherapy agents, other medicines, foods, dyes, or preservatives -pregnant or trying to get  pregnant -breast-feeding How should I use this medicine? This drug is given as an infusion into a vein. It is administered in a hospital or clinic by a specially trained health care professional. Talk to your pediatrician regarding the use of this medicine in children. Special care may be needed. Overdosage: If you think you have taken too much of this medicine contact a poison control center or emergency room at once. NOTE: This medicine is only for you. Do not share this medicine with others. What if I miss a dose? It is important not to miss your dose. Call your doctor or health care professional if you are unable to keep an appointment. What may interact with this medicine? Do not take this medicine with any of the following medications: -disulfiram -metronidazole This medicine may also interact with the following medications: -cyclosporine -diazepam -ketoconazole -medicines to increase blood counts like filgrastim, pegfilgrastim, sargramostim -other chemotherapy drugs like cisplatin, doxorubicin, epirubicin, etoposide, teniposide, vincristine -quinidine -testosterone -vaccines -verapamil Talk to your doctor or health care professional before taking any of these medicines: -acetaminophen -aspirin -ibuprofen -ketoprofen -naproxen This list may not describe all possible interactions. Give your health care provider a list of all the medicines, herbs, non-prescription drugs, or dietary supplements you use. Also tell them if you smoke, drink alcohol, or use illegal drugs. Some items may interact with your medicine. What should I watch for while using this medicine? Your condition will be monitored carefully while you are receiving this medicine. You will need important blood work done while you are taking this medicine. This drug may make you feel generally unwell. This is not uncommon, as chemotherapy can affect healthy cells as well as cancer  cells. Report any side effects. Continue  your course of treatment even though you feel ill unless your doctor tells you to stop. This medicine can cause serious allergic reactions. To reduce your risk you will need to take other medicine(s) before treatment with this medicine. In some cases, you may be given additional medicines to help with side effects. Follow all directions for their use. Call your doctor or health care professional for advice if you get a fever, chills or sore throat, or other symptoms of a cold or flu. Do not treat yourself. This drug decreases your body's ability to fight infections. Try to avoid being around people who are sick. This medicine may increase your risk to bruise or bleed. Call your doctor or health care professional if you notice any unusual bleeding. Be careful brushing and flossing your teeth or using a toothpick because you may get an infection or bleed more easily. If you have any dental work done, tell your dentist you are receiving this medicine. Avoid taking products that contain aspirin, acetaminophen, ibuprofen, naproxen, or ketoprofen unless instructed by your doctor. These medicines may hide a fever. Do not become pregnant while taking this medicine. Women should inform their doctor if they wish to become pregnant or think they might be pregnant. There is a potential for serious side effects to an unborn child. Talk to your health care professional or pharmacist for more information. Do not breast-feed an infant while taking this medicine. Men are advised not to father a child while receiving this medicine. This product may contain alcohol. Ask your pharmacist or healthcare provider if this medicine contains alcohol. Be sure to tell all healthcare providers you are taking this medicine. Certain medicines, like metronidazole and disulfiram, can cause an unpleasant reaction when taken with alcohol. The reaction includes flushing, headache, nausea, vomiting, sweating, and increased thirst. The reaction  can last from 30 minutes to several hours. What side effects may I notice from receiving this medicine? Side effects that you should report to your doctor or health care professional as soon as possible: -allergic reactions like skin rash, itching or hives, swelling of the face, lips, or tongue -low blood counts - This drug may decrease the number of white blood cells, red blood cells and platelets. You may be at increased risk for infections and bleeding. -signs of infection - fever or chills, cough, sore throat, pain or difficulty passing urine -signs of decreased platelets or bleeding - bruising, pinpoint red spots on the skin, black, tarry stools, nosebleeds -signs of decreased red blood cells - unusually weak or tired, fainting spells, lightheadedness -breathing problems -chest pain -high or low blood pressure -mouth sores -nausea and vomiting -pain, swelling, redness or irritation at the injection site -pain, tingling, numbness in the hands or feet -slow or irregular heartbeat -swelling of the ankle, feet, hands Side effects that usually do not require medical attention (report to your doctor or health care professional if they continue or are bothersome): -bone pain -complete hair loss including hair on your head, underarms, pubic hair, eyebrows, and eyelashes -changes in the color of fingernails -diarrhea -loosening of the fingernails -loss of appetite -muscle or joint pain -red flush to skin -sweating This list may not describe all possible side effects. Call your doctor for medical advice about side effects. You may report side effects to FDA at 1-800-FDA-1088. Where should I keep my medicine? This drug is given in a hospital or clinic and will not be stored at home. NOTE:   This sheet is a summary. It may not cover all possible information. If you have questions about this medicine, talk to your doctor, pharmacist, or health care provider.    2016, Elsevier/Gold Standard.  (2015-07-09 13:02:56)  Carboplatin injection What is this medicine? CARBOPLATIN (KAR boe pla tin) is a chemotherapy drug. It targets fast dividing cells, like cancer cells, and causes these cells to die. This medicine is used to treat ovarian cancer and many other cancers. This medicine may be used for other purposes; ask your health care provider or pharmacist if you have questions. What should I tell my health care provider before I take this medicine? They need to know if you have any of these conditions: -blood disorders -hearing problems -kidney disease -recent or ongoing radiation therapy -an unusual or allergic reaction to carboplatin, cisplatin, other chemotherapy, other medicines, foods, dyes, or preservatives -pregnant or trying to get pregnant -breast-feeding How should I use this medicine? This drug is usually given as an infusion into a vein. It is administered in a hospital or clinic by a specially trained health care professional. Talk to your pediatrician regarding the use of this medicine in children. Special care may be needed. Overdosage: If you think you have taken too much of this medicine contact a poison control center or emergency room at once. NOTE: This medicine is only for you. Do not share this medicine with others. What if I miss a dose? It is important not to miss a dose. Call your doctor or health care professional if you are unable to keep an appointment. What may interact with this medicine? -medicines for seizures -medicines to increase blood counts like filgrastim, pegfilgrastim, sargramostim -some antibiotics like amikacin, gentamicin, neomycin, streptomycin, tobramycin -vaccines Talk to your doctor or health care professional before taking any of these medicines: -acetaminophen -aspirin -ibuprofen -ketoprofen -naproxen This list may not describe all possible interactions. Give your health care provider a list of all the medicines, herbs,  non-prescription drugs, or dietary supplements you use. Also tell them if you smoke, drink alcohol, or use illegal drugs. Some items may interact with your medicine. What should I watch for while using this medicine? Your condition will be monitored carefully while you are receiving this medicine. You will need important blood work done while you are taking this medicine. This drug may make you feel generally unwell. This is not uncommon, as chemotherapy can affect healthy cells as well as cancer cells. Report any side effects. Continue your course of treatment even though you feel ill unless your doctor tells you to stop. In some cases, you may be given additional medicines to help with side effects. Follow all directions for their use. Call your doctor or health care professional for advice if you get a fever, chills or sore throat, or other symptoms of a cold or flu. Do not treat yourself. This drug decreases your body's ability to fight infections. Try to avoid being around people who are sick. This medicine may increase your risk to bruise or bleed. Call your doctor or health care professional if you notice any unusual bleeding. Be careful brushing and flossing your teeth or using a toothpick because you may get an infection or bleed more easily. If you have any dental work done, tell your dentist you are receiving this medicine. Avoid taking products that contain aspirin, acetaminophen, ibuprofen, naproxen, or ketoprofen unless instructed by your doctor. These medicines may hide a fever. Do not become pregnant while taking this medicine. Women should  inform their doctor if they wish to become pregnant or think they might be pregnant. There is a potential for serious side effects to an unborn child. Talk to your health care professional or pharmacist for more information. Do not breast-feed an infant while taking this medicine. What side effects may I notice from receiving this medicine? Side effects  that you should report to your doctor or health care professional as soon as possible: -allergic reactions like skin rash, itching or hives, swelling of the face, lips, or tongue -signs of infection - fever or chills, cough, sore throat, pain or difficulty passing urine -signs of decreased platelets or bleeding - bruising, pinpoint red spots on the skin, black, tarry stools, nosebleeds -signs of decreased red blood cells - unusually weak or tired, fainting spells, lightheadedness -breathing problems -changes in hearing -changes in vision -chest pain -high blood pressure -low blood counts - This drug may decrease the number of white blood cells, red blood cells and platelets. You may be at increased risk for infections and bleeding. -nausea and vomiting -pain, swelling, redness or irritation at the injection site -pain, tingling, numbness in the hands or feet -problems with balance, talking, walking -trouble passing urine or change in the amount of urine Side effects that usually do not require medical attention (report to your doctor or health care professional if they continue or are bothersome): -hair loss -loss of appetite -metallic taste in the mouth or changes in taste This list may not describe all possible side effects. Call your doctor for medical advice about side effects. You may report side effects to FDA at 1-800-FDA-1088. Where should I keep my medicine? This drug is given in a hospital or clinic and will not be stored at home. NOTE: This sheet is a summary. It may not cover all possible information. If you have questions about this medicine, talk to your doctor, pharmacist, or health care provider.    2016, Elsevier/Gold Standard. (2008-02-26 14:38:05)

## 2016-06-10 ENCOUNTER — Telehealth: Payer: Self-pay | Admitting: *Deleted

## 2016-06-10 NOTE — Telephone Encounter (Signed)
-----   Message from Azzie Glatter, RN sent at 06/09/2016  3:59 PM EDT ----- Regarding: "1st time chemotherapy---per Dr. [name]" Contact: [phone] Patient received Taxol and Carboplatin today for the 1st time, per Dr. [name].  Tolerated treatment well with no complaints.

## 2016-06-10 NOTE — Telephone Encounter (Signed)
Chemotherapy follow up: Patient has no question/concerns at this time. Instructed patient to call if any questions arise. Patient verbalized understanding.

## 2016-06-16 ENCOUNTER — Ambulatory Visit (HOSPITAL_BASED_OUTPATIENT_CLINIC_OR_DEPARTMENT_OTHER): Payer: Medicaid Other | Admitting: Oncology

## 2016-06-16 ENCOUNTER — Other Ambulatory Visit (HOSPITAL_BASED_OUTPATIENT_CLINIC_OR_DEPARTMENT_OTHER): Payer: Medicaid Other

## 2016-06-16 ENCOUNTER — Ambulatory Visit (HOSPITAL_BASED_OUTPATIENT_CLINIC_OR_DEPARTMENT_OTHER): Payer: Medicaid Other

## 2016-06-16 ENCOUNTER — Encounter: Payer: Self-pay | Admitting: *Deleted

## 2016-06-16 VITALS — BP 115/83 | HR 90 | Temp 98.0°F | Resp 18 | Ht 61.5 in | Wt 178.6 lb

## 2016-06-16 DIAGNOSIS — C50411 Malignant neoplasm of upper-outer quadrant of right female breast: Secondary | ICD-10-CM

## 2016-06-16 DIAGNOSIS — Z720 Tobacco use: Secondary | ICD-10-CM

## 2016-06-16 DIAGNOSIS — Z171 Estrogen receptor negative status [ER-]: Secondary | ICD-10-CM

## 2016-06-16 DIAGNOSIS — Z5111 Encounter for antineoplastic chemotherapy: Secondary | ICD-10-CM

## 2016-06-16 LAB — CBC WITH DIFFERENTIAL/PLATELET
BASO%: 0.5 % (ref 0.0–2.0)
BASOS ABS: 0.1 10*3/uL (ref 0.0–0.1)
EOS ABS: 0 10*3/uL (ref 0.0–0.5)
EOS%: 0.4 % (ref 0.0–7.0)
HEMATOCRIT: 27.3 % — AB (ref 34.8–46.6)
HEMOGLOBIN: 9.5 g/dL — AB (ref 11.6–15.9)
LYMPH#: 1 10*3/uL (ref 0.9–3.3)
LYMPH%: 10.1 % — ABNORMAL LOW (ref 14.0–49.7)
MCH: 31.4 pg (ref 25.1–34.0)
MCHC: 34.8 g/dL (ref 31.5–36.0)
MCV: 90.1 fL (ref 79.5–101.0)
MONO#: 1.1 10*3/uL — AB (ref 0.1–0.9)
MONO%: 11.4 % (ref 0.0–14.0)
NEUT#: 7.7 10*3/uL — ABNORMAL HIGH (ref 1.5–6.5)
NEUT%: 77.6 % — ABNORMAL HIGH (ref 38.4–76.8)
PLATELETS: 280 10*3/uL (ref 145–400)
RBC: 3.03 10*6/uL — ABNORMAL LOW (ref 3.70–5.45)
RDW: 17.8 % — AB (ref 11.2–14.5)
WBC: 9.9 10*3/uL (ref 3.9–10.3)

## 2016-06-16 LAB — COMPREHENSIVE METABOLIC PANEL
ALBUMIN: 3.9 g/dL (ref 3.5–5.0)
ALK PHOS: 63 U/L (ref 40–150)
ALT: 10 U/L (ref 0–55)
AST: 14 U/L (ref 5–34)
Anion Gap: 8 mEq/L (ref 3–11)
BUN: 8.1 mg/dL (ref 7.0–26.0)
CALCIUM: 9.4 mg/dL (ref 8.4–10.4)
CO2: 25 mEq/L (ref 22–29)
Chloride: 108 mEq/L (ref 98–109)
Creatinine: 0.8 mg/dL (ref 0.6–1.1)
EGFR: >90 mL/min/{1.73_m2} (ref >90)
Glucose: 90 mg/dl (ref 70–140)
POTASSIUM: 4 meq/L (ref 3.5–5.1)
Sodium: 142 mEq/L (ref 136–145)
Total Bilirubin: 0.4 mg/dL (ref 0.20–1.20)
Total Protein: 7.4 g/dL (ref 6.4–8.3)

## 2016-06-16 MED ORDER — SODIUM CHLORIDE 0.9 % IV SOLN
20.0000 mg | Freq: Once | INTRAVENOUS | Status: AC
  Administered 2016-06-16: 20 mg via INTRAVENOUS
  Filled 2016-06-16: qty 2

## 2016-06-16 MED ORDER — FAMOTIDINE IN NACL 20-0.9 MG/50ML-% IV SOLN
20.0000 mg | Freq: Once | INTRAVENOUS | Status: AC
  Administered 2016-06-16: 20 mg via INTRAVENOUS

## 2016-06-16 MED ORDER — SODIUM CHLORIDE 0.9% FLUSH
10.0000 mL | INTRAVENOUS | Status: DC | PRN
  Administered 2016-06-16: 10 mL
  Filled 2016-06-16: qty 10

## 2016-06-16 MED ORDER — FAMOTIDINE IN NACL 20-0.9 MG/50ML-% IV SOLN
INTRAVENOUS | Status: AC
  Filled 2016-06-16: qty 50

## 2016-06-16 MED ORDER — SODIUM CHLORIDE 0.9 % IV SOLN
Freq: Once | INTRAVENOUS | Status: AC
  Administered 2016-06-16: 13:00:00 via INTRAVENOUS

## 2016-06-16 MED ORDER — DIPHENHYDRAMINE HCL 50 MG/ML IJ SOLN
INTRAMUSCULAR | Status: AC
  Filled 2016-06-16: qty 1

## 2016-06-16 MED ORDER — HEPARIN SOD (PORK) LOCK FLUSH 100 UNIT/ML IV SOLN
500.0000 [IU] | Freq: Once | INTRAVENOUS | Status: AC | PRN
  Administered 2016-06-16: 500 [IU]
  Filled 2016-06-16: qty 5

## 2016-06-16 MED ORDER — SODIUM CHLORIDE 0.9 % IV SOLN
285.2000 mg | Freq: Once | INTRAVENOUS | Status: AC
  Administered 2016-06-16: 290 mg via INTRAVENOUS
  Filled 2016-06-16: qty 29

## 2016-06-16 MED ORDER — SODIUM CHLORIDE 0.9 % IV SOLN
80.0000 mg/m2 | Freq: Once | INTRAVENOUS | Status: AC
  Administered 2016-06-16: 150 mg via INTRAVENOUS
  Filled 2016-06-16: qty 25

## 2016-06-16 MED ORDER — DIPHENHYDRAMINE HCL 50 MG/ML IJ SOLN
25.0000 mg | Freq: Once | INTRAMUSCULAR | Status: AC
  Administered 2016-06-16: 25 mg via INTRAVENOUS

## 2016-06-16 NOTE — Progress Notes (Signed)
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 and passed her through a variety of  [place]  Telephone:(336) [phone] Fax:(336) [phone]   ID: [name] DOB: 14-Sep-1976  MR#: [id number]  VQM#:[id number]  Patient Care Team: Dorena Dew, FNP as PCP - General (Family Medicine) Alphonsa Overall, MD as Consulting Physician (General Surgery) Chauncey Cruel, MD as Consulting Physician (Oncology) Gery Pray, MD as Consulting Physician (Radiation Oncology) Sylvan Cheese, NP as Nurse Practitioner (Hematology and Oncology) Benson Norway, RN as Registered Nurse PCP: Dorena Dew, FNP GYN: OTHER MD:  CHIEF COMPLAINT: triple negative breast cancer  CURRENT TREATMENT: neoadjuvant chemotherapy  BREAST CANCER HISTORY: From the original intake note:  Shiesha herself noted a change in her right breast sometime around September or October 2016. She did not bring it to intermediate medical attention, but on 01/13/2016 she established herself in Dr. Smith Robert' service and she was set up for bilateral diagnostic mammography with tomosynthesis and bilateral ultrasonography at the [facility] 01/19/2016. The breast density was category B. In the upper outer quadrant of the right breast there was a spiculated mass measuring 2.8 cm. On physical exam this was palpable. Targeted ultrasonography confirmed an irregular hypoechoic mass in the right breast 11:30 o'clock position measuring 2.6 cm maximally. Ultrasound of the right axilla showed a morphologically abnormal lymph node.  In the left breast there were some tubular densities behind the areola which by ultrasonography showed benign ductal ectasia.  On 01/28/2016 Cianni underwent biopsy of the right breast mass and abnormal right axillary lymph node. The pathology from this procedure (S AAA [id number]) showed the lymph node to be benign. In the breast however there was an invasive ductal carcinoma, grade 3, which  was estrogen and progesterone receptor negative. The proliferation marker was 70%. HER-2 was not amplified with a signals ratio of 1.32. The number per cell was 2.05.  The patient's subsequent history is as detailed below  INTERVAL HISTORY: Brianni returns today for follow-up of her triple negative breast cancer. Today is day 1, cycle 2 of 12 planned cycles of paclitaxel and carboplatin given weekly.   REVIEW OF SYSTEMS: The patient is she tolerated her first cycle of recurrent agents without major complications. She did feel weak and tired, had some headaches, and continues to be anxious and depressed. He had some muscle aches. She had a little bit of a runny nose. She complained of nausea, and some heartburn issues. Aside from that a detailed review of systems today was stable   PAST MEDICAL HISTORY: Past Medical History  Diagnosis Date  . Breast cancer ([place])   . Depression   . Anxiety   . Sickle cell trait ([place])   . Obesity (BMI 35.0-39.9 without comorbidity) ([place])   . Hypertension     pt states is currently on no medications   . Seasonal allergies   . GERD (gastroesophageal reflux disease)   . Termination of pregnancy (fetus) 04/02/16    PAST SURGICAL HISTORY: Past Surgical History  Procedure Laterality Date  . Cesarean section      2004 and 2007    FAMILY HISTORY Family History  Problem Relation Age of Onset  . Hypertension Mother   . Uterine cancer Mother   . Cancer Father   . Hypertension Father   . Breast cancer Maternal Aunt   . Prostate cancer Maternal Uncle   . Breast cancer Maternal Grandmother   . Throat cancer Maternal Grandfather   .  Breast cancer Cousin   The patient has very little information about her father. Her mother is currently 19 years old. She had a history of cervical cancer at age 42. The patient had 2 brothers, no sisters. The maternal grandfather had throat cancer. A maternal uncle was diagnosed with breast cancer as well as prostate cancer at  the age of 43. 2 maternal cousins, one of the mail, had breast cancer as well.  GYNECOLOGIC HISTORY:  No LMP recorded. Menarche age 66, first live birth age 89. The patient is GX P4. She still having regular periods. She took oral contraceptives in the 1990s with no side effects.  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 The patient's significant other [name] works at [employer]. In addition to them the patient's 3 children Chasmine [place], [place] and [name] are also in the home. There are age 59, 27, and 34. The patient's son [name], currently 89 years old, lives in [place].    ADVANCED DIRECTIVES: Not in place   HEALTH MAINTENANCE: Social History  Substance Use Topics  . Smoking status: Current Every Day Smoker -- 0.25 packs/day for 20 years    Types: Cigarettes  . Smokeless tobacco: Never Used  . Alcohol Use: Yes     Comment: occ     Colonoscopy:  PAP:  Bone density:  Lipid panel:  No Known Allergies  Current Outpatient Prescriptions  Medication Sig Dispense Refill  . LORazepam (ATIVAN) 0.5 MG tablet Take 1 tablet (0.5 mg total) by mouth at bedtime. 30 tablet 0  . omeprazole (PRILOSEC) 20 MG capsule Take 1 capsule (20 mg total) by mouth daily. 30 capsule 3   No current facility-administered medications for this visit.   Facility-Administered Medications Ordered in Other Visits  Medication Dose Route Frequency Provider Last Rate Last Dose  . sodium chloride flush (NS) 0.9 % injection 10 mL  10 mL Intracatheter PRN Chauncey Cruel, MD   10 mL at 06/16/16 1538    OBJECTIVE: Young African-American womanIn who appears stated age 40 Vitals:   06/16/16 1116  BP: 115/83  Pulse: 90  Temp: 98 F (36.7 C)  Resp: 18     Body mass index is 33.2 kg/(m^2).    ECOG FS:1 - Symptomatic but completely ambulatory  Sclerae unicteric, EOMs intact Oropharynx clear and moist No  cervical or supraclavicular adenopathy Lungs no rales or rhonchi Heart regular rate and rhythm Abd soft, nontender, positive bowel sounds MSK no focal spinal tenderness, no upper extremity lymphedema Neuro: nonfocal, well oriented, appropriate affect Breasts: Deferred   LAB RESULTS:  CMP     Component Value Date/Time   NA 142 06/16/2016 1106   NA 137 04/11/2016 1045   K 4.0 06/16/2016 1106   K 3.4* 04/11/2016 1045   CL 106 04/11/2016 1045   CO2 25 06/16/2016 1106   CO2 25 04/11/2016 1045   GLUCOSE 90 06/16/2016 1106   GLUCOSE 84 04/11/2016 1045   BUN 8.1 06/16/2016 1106   BUN 5* 04/11/2016 1045   CREATININE 0.8 06/16/2016 1106   CREATININE 0.78 04/11/2016 1045   CREATININE 0.75 01/12/2016 1115   CALCIUM 9.4 06/16/2016 1106   CALCIUM 8.6* 04/11/2016 1045   PROT 7.4 06/16/2016 1106   PROT 7.1 01/12/2016 1115   ALBUMIN 3.9 06/16/2016 1106   ALBUMIN 4.2 01/12/2016 1115   AST 14 06/16/2016 1106   AST 12  01/12/2016 1115   ALT 10 06/16/2016 1106   ALT 8 01/12/2016 1115   ALKPHOS 63 06/16/2016 1106   ALKPHOS 47 01/12/2016 1115   BILITOT 0.40 06/16/2016 1106   BILITOT 0.4 01/12/2016 1115   GFRNONAA >60 04/11/2016 1045   GFRNONAA >89 01/12/2016 1115   GFRAA >60 04/11/2016 1045   GFRAA >89 01/12/2016 1115    INo results found for: SPEP, UPEP  Lab Results  Component Value Date   WBC 9.9 06/16/2016   NEUTROABS 7.7* 06/16/2016   HGB 9.5* 06/16/2016   HCT 27.3* 06/16/2016   MCV 90.1 06/16/2016   PLT 280 06/16/2016      Chemistry      Component Value Date/Time   NA 142 06/16/2016 1106   NA 137 04/11/2016 1045   K 4.0 06/16/2016 1106   K 3.4* 04/11/2016 1045   CL 106 04/11/2016 1045   CO2 25 06/16/2016 1106   CO2 25 04/11/2016 1045   BUN 8.1 06/16/2016 1106   BUN 5* 04/11/2016 1045   CREATININE 0.8 06/16/2016 1106   CREATININE 0.78 04/11/2016 1045   CREATININE 0.75 01/12/2016 1115      Component Value Date/Time   CALCIUM 9.4 06/16/2016 1106   CALCIUM 8.6*  04/11/2016 1045   ALKPHOS 63 06/16/2016 1106   ALKPHOS 47 01/12/2016 1115   AST 14 06/16/2016 1106   AST 12 01/12/2016 1115   ALT 10 06/16/2016 1106   ALT 8 01/12/2016 1115   BILITOT 0.40 06/16/2016 1106   BILITOT 0.4 01/12/2016 1115       No results found for: LABCA2  No components found for: LABCA125  No results for input(s): INR in the last 168 hours.  Urinalysis    Component Value Date/Time   LABSPEC 1.020 01/12/2016 1122   PHURINE 5.5 01/12/2016 1122   GLUCOSEU NEGATIVE 01/12/2016 1122   HGBUR TRACE* 01/12/2016 1122   BILIRUBINUR NEGATIVE 01/12/2016 1122   KETONESUR NEGATIVE 01/12/2016 1122   PROTEINUR NEGATIVE 01/12/2016 1122   UROBILINOGEN 0.2 01/12/2016 1122   NITRITE NEGATIVE 01/12/2016 1122   LEUKOCYTESUR NEGATIVE 01/12/2016 1122    ELIGIBLE FOR AVAILABLE RESEARCH PROTOCOL: ASA, B-WELL  STUDIES: No results found.  ASSESSMENT: 40 y.o. [place] woman status post right breast upper-outer quadrant biopsy 01/28/2016 for a clinical T2 N0 invasive ductal carcinoma, grade 3, triple negative, with an MIB-1 of 70%.  (a) suspicious right axillary lymph node biopsied 01/28/2016 was benign  (1) neoadjuvant chemotherapy: doxorubicin and cyclophosphamide in dose dense fashion 4 started 04/14/16, completed 05/26/2016, followed by paclitaxel and carboplatin weekly 12,   (2) genetics testing is pending  (3) definitive surgery to follow  (5) adjuvant radiation to follow as appropriate  PLAN: Kenley is tolerating the second part of her chemotherapy well. Hopefully we will be able to get her through most or all of her current treatments without significant peripheral neuropathy problems.  She is having no nausea issues and does not want to take nausea medicine. She will call us if that problem develops.  She will see me again with the next dose, 06/23/2016. She knows to call for any other problems that may develop before that visit.   Chauncey Cruel, MD   06/16/2016  6:25 PM

## 2016-06-16 NOTE — Patient Instructions (Signed)
[facility] Discharge Instructions for Patients Receiving Chemotherapy  Today you received the following chemotherapy agents:  Carboplatin, Taxol  To help prevent nausea and vomiting after your treatment, we encourage you to take your nausea medication as prescribed.   If you develop nausea and vomiting that is not controlled by your nausea medication, call the clinic.   BELOW ARE SYMPTOMS THAT SHOULD BE REPORTED IMMEDIATELY:  *FEVER GREATER THAN 100.5 F  *CHILLS WITH OR WITHOUT FEVER  NAUSEA AND VOMITING THAT IS NOT CONTROLLED WITH YOUR NAUSEA MEDICATION  *UNUSUAL SHORTNESS OF BREATH  *UNUSUAL BRUISING OR BLEEDING  TENDERNESS IN MOUTH AND THROAT WITH OR WITHOUT PRESENCE OF ULCERS  *URINARY PROBLEMS  *BOWEL PROBLEMS  UNUSUAL RASH Items with * indicate a potential emergency and should be followed up as soon as possible.  Feel free to call the clinic you have any questions or concerns. The clinic phone number is [phone].  Please show the CHEMO ALERT CARD at check-in to the Emergency Department and triage nurse.   

## 2016-06-21 ENCOUNTER — Inpatient Hospital Stay (HOSPITAL_COMMUNITY): Admission: RE | Admit: 2016-06-21 | Payer: Medicaid Other | Source: Ambulatory Visit

## 2016-06-21 ENCOUNTER — Ambulatory Visit (HOSPITAL_COMMUNITY): Admission: RE | Admit: 2016-06-21 | Payer: Medicaid Other | Source: Ambulatory Visit

## 2016-06-23 ENCOUNTER — Encounter: Payer: Self-pay | Admitting: *Deleted

## 2016-06-23 ENCOUNTER — Telehealth (HOSPITAL_COMMUNITY): Payer: Self-pay | Admitting: Vascular Surgery

## 2016-06-23 ENCOUNTER — Other Ambulatory Visit (HOSPITAL_BASED_OUTPATIENT_CLINIC_OR_DEPARTMENT_OTHER): Payer: Medicaid Other

## 2016-06-23 ENCOUNTER — Ambulatory Visit (HOSPITAL_BASED_OUTPATIENT_CLINIC_OR_DEPARTMENT_OTHER): Payer: Medicaid Other | Admitting: Oncology

## 2016-06-23 ENCOUNTER — Ambulatory Visit (HOSPITAL_BASED_OUTPATIENT_CLINIC_OR_DEPARTMENT_OTHER): Payer: Medicaid Other

## 2016-06-23 VITALS — BP 115/78 | HR 88 | Temp 98.2°F | Resp 18 | Ht 61.5 in | Wt 179.9 lb

## 2016-06-23 DIAGNOSIS — Z720 Tobacco use: Secondary | ICD-10-CM | POA: Diagnosis not present

## 2016-06-23 DIAGNOSIS — M545 Low back pain: Secondary | ICD-10-CM | POA: Diagnosis not present

## 2016-06-23 DIAGNOSIS — M25569 Pain in unspecified knee: Secondary | ICD-10-CM

## 2016-06-23 DIAGNOSIS — Z171 Estrogen receptor negative status [ER-]: Secondary | ICD-10-CM

## 2016-06-23 DIAGNOSIS — Z5111 Encounter for antineoplastic chemotherapy: Secondary | ICD-10-CM | POA: Diagnosis not present

## 2016-06-23 DIAGNOSIS — C50411 Malignant neoplasm of upper-outer quadrant of right female breast: Secondary | ICD-10-CM | POA: Diagnosis present

## 2016-06-23 LAB — CBC WITH DIFFERENTIAL/PLATELET
BASO%: 0.4 % (ref 0.0–2.0)
Basophils Absolute: 0 10*3/uL (ref 0.0–0.1)
EOS%: 0.9 % (ref 0.0–7.0)
Eosinophils Absolute: 0.1 10*3/uL (ref 0.0–0.5)
HEMATOCRIT: 24.9 % — AB (ref 34.8–46.6)
HEMOGLOBIN: 8.6 g/dL — AB (ref 11.6–15.9)
LYMPH#: 1.1 10*3/uL (ref 0.9–3.3)
LYMPH%: 16.3 % (ref 14.0–49.7)
MCH: 31.6 pg (ref 25.1–34.0)
MCHC: 34.5 g/dL (ref 31.5–36.0)
MCV: 91.5 fL (ref 79.5–101.0)
MONO#: 0.5 10*3/uL (ref 0.1–0.9)
MONO%: 7.6 % (ref 0.0–14.0)
NEUT%: 74.8 % (ref 38.4–76.8)
NEUTROS ABS: 5 10*3/uL (ref 1.5–6.5)
PLATELETS: 161 10*3/uL (ref 145–400)
RBC: 2.72 10*6/uL — AB (ref 3.70–5.45)
RDW: 18.1 % — AB (ref 11.2–14.5)
WBC: 6.7 10*3/uL (ref 3.9–10.3)

## 2016-06-23 LAB — COMPREHENSIVE METABOLIC PANEL
ALBUMIN: 3.8 g/dL (ref 3.5–5.0)
ANION GAP: 9 meq/L (ref 3–11)
AST: 11 U/L (ref 5–34)
Alkaline Phosphatase: 59 U/L (ref 40–150)
BUN: 9.9 mg/dL (ref 7.0–26.0)
CO2: 25 meq/L (ref 22–29)
CREATININE: 0.8 mg/dL (ref 0.6–1.1)
Calcium: 9.2 mg/dL (ref 8.4–10.4)
Chloride: 109 mEq/L (ref 98–109)
EGFR: >90 mL/min/{1.73_m2} (ref >90)
Glucose: 82 mg/dl (ref 70–140)
Potassium: 3.5 mEq/L (ref 3.5–5.1)
Sodium: 143 mEq/L (ref 136–145)
TOTAL PROTEIN: 7 g/dL (ref 6.4–8.3)

## 2016-06-23 MED ORDER — PALONOSETRON HCL INJECTION 0.25 MG/5ML
INTRAVENOUS | Status: AC
  Filled 2016-06-23: qty 5

## 2016-06-23 MED ORDER — HEPARIN SOD (PORK) LOCK FLUSH 100 UNIT/ML IV SOLN
500.0000 [IU] | Freq: Once | INTRAVENOUS | Status: AC | PRN
  Administered 2016-06-23: 500 [IU]
  Filled 2016-06-23: qty 5

## 2016-06-23 MED ORDER — DEXAMETHASONE SODIUM PHOSPHATE 100 MG/10ML IJ SOLN
20.0000 mg | Freq: Once | INTRAMUSCULAR | Status: AC
  Administered 2016-06-23: 20 mg via INTRAVENOUS
  Filled 2016-06-23: qty 2

## 2016-06-23 MED ORDER — FAMOTIDINE IN NACL 20-0.9 MG/50ML-% IV SOLN
INTRAVENOUS | Status: AC
  Filled 2016-06-23: qty 50

## 2016-06-23 MED ORDER — PALONOSETRON HCL INJECTION 0.25 MG/5ML: 0.2500 mg | Freq: Once | INTRAVENOUS | Status: DC

## 2016-06-23 MED ORDER — FAMOTIDINE IN NACL 20-0.9 MG/50ML-% IV SOLN
20.0000 mg | Freq: Once | INTRAVENOUS | Status: AC
  Administered 2016-06-23: 20 mg via INTRAVENOUS

## 2016-06-23 MED ORDER — SODIUM CHLORIDE 0.9% FLUSH
10.0000 mL | INTRAVENOUS | Status: DC | PRN
  Administered 2016-06-23: 10 mL
  Filled 2016-06-23: qty 10

## 2016-06-23 MED ORDER — SODIUM CHLORIDE 0.9 % IV SOLN
80.0000 mg/m2 | Freq: Once | INTRAVENOUS | Status: AC
  Administered 2016-06-23: 150 mg via INTRAVENOUS
  Filled 2016-06-23: qty 25

## 2016-06-23 MED ORDER — SODIUM CHLORIDE 0.9 % IV SOLN
Freq: Once | INTRAVENOUS | Status: AC
  Administered 2016-06-23: 13:00:00 via INTRAVENOUS

## 2016-06-23 MED ORDER — DIPHENHYDRAMINE HCL 50 MG/ML IJ SOLN
25.0000 mg | Freq: Once | INTRAMUSCULAR | Status: AC
  Administered 2016-06-23: 25 mg via INTRAVENOUS

## 2016-06-23 MED ORDER — DIPHENHYDRAMINE HCL 50 MG/ML IJ SOLN
INTRAMUSCULAR | Status: AC
  Filled 2016-06-23: qty 1

## 2016-06-23 MED ORDER — SODIUM CHLORIDE 0.9 % IV SOLN
285.2000 mg | Freq: Once | INTRAVENOUS | Status: AC
  Administered 2016-06-23: 290 mg via INTRAVENOUS
  Filled 2016-06-23: qty 29

## 2016-06-23 NOTE — Progress Notes (Signed)
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 and passed her through a variety of  [place]  Telephone:(336) [phone] Fax:(336) [phone]   ID: [name] DOB: 1976/10/12  MR#: [id number]  TIR#:[id number]  Patient Care Team: Dorena Dew, FNP as PCP - General (Family Medicine) Alphonsa Overall, MD as Consulting Physician (General Surgery) Chauncey Cruel, MD as Consulting Physician (Oncology) Gery Pray, MD as Consulting Physician (Radiation Oncology) Sylvan Cheese, NP as Nurse Practitioner (Hematology and Oncology) Benson Norway, RN as Registered Nurse PCP: Dorena Dew, FNP GYN: OTHER MD:  CHIEF COMPLAINT: triple negative breast cancer  CURRENT TREATMENT: neoadjuvant chemotherapy  BREAST CANCER HISTORY: From the original intake note:  Torrey herself noted a change in her right breast sometime around September or October 2016. She did not bring it to intermediate medical attention, but on 01/13/2016 she established herself in Dr. Smith Robert' service and she was set up for bilateral diagnostic mammography with tomosynthesis and bilateral ultrasonography at the [facility] 01/19/2016. The breast density was category B. In the upper outer quadrant of the right breast there was a spiculated mass measuring 2.8 cm. On physical exam this was palpable. Targeted ultrasonography confirmed an irregular hypoechoic mass in the right breast 11:30 o'clock position measuring 2.6 cm maximally. Ultrasound of the right axilla showed a morphologically abnormal lymph node.  In the left breast there were some tubular densities behind the areola which by ultrasonography showed benign ductal ectasia.  On 01/28/2016 Myanna underwent biopsy of the right breast mass and abnormal right axillary lymph node. The pathology from this procedure (S AAA [id number]) showed the lymph node to be benign. In the breast however there was an invasive ductal carcinoma, grade 3, which  was estrogen and progesterone receptor negative. The proliferation marker was 70%. HER-2 was not amplified with a signals ratio of 1.32. The number per cell was 2.05.  The patient's subsequent history is as detailed below  INTERVAL HISTORY: Saanya returns today for follow-up of her early stage breast cancer. Today is day 1, cycle 3 of 12 planned cycles of paclitaxel and carboplatin given weekly.   REVIEW OF SYSTEMS: [name] is having some fatigue. She has no mouth sores or nausea is not a problem. She still has a fair sense of taste. She tells me she has had numbness in her fingertips and toetips for many years and it isn't any different. She sleeps poorly. She continues to have back and hip pain. This predated her diagnosis of breast cancer. She has a runny nose. She feels anxious and depressed. Overall though she is "all right". A detailed review of systems today was stable   PAST MEDICAL HISTORY: Past Medical History  Diagnosis Date  . Breast cancer ([place])   . Depression   . Anxiety   . Sickle cell trait ([place])   . Obesity (BMI 35.0-39.9 without comorbidity) ([place])   . Hypertension     pt states is currently on no medications   . Seasonal allergies   . GERD (gastroesophageal reflux disease)   . Termination of pregnancy (fetus) 04/02/16    PAST SURGICAL HISTORY: Past Surgical History  Procedure Laterality Date  . Cesarean section      2004 and 2007    FAMILY HISTORY Family History  Problem Relation Age of Onset  . Hypertension Mother   . Uterine cancer Mother   . Cancer Father   . Hypertension Father   . Breast cancer Maternal Aunt   .  Prostate cancer Maternal Uncle   . Breast cancer Maternal Grandmother   . Throat cancer Maternal Grandfather   . Breast cancer Cousin   The patient has very little information about her father. Her mother is currently 8 years old. She had a history of cervical cancer at age 57. The patient had 2 brothers, no sisters. The maternal grandfather  had throat cancer. A maternal uncle was diagnosed with breast cancer as well as prostate cancer at the age of 48. 2 maternal cousins, one of the mail, had breast cancer as well.  GYNECOLOGIC HISTORY:  No LMP recorded. Menarche age 18, first live birth age 52. The patient is GX P4. She still having regular periods. She took oral contraceptives in the 1990s with no side effects.  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 The patient's significant other [name] works at [employer]. In addition to them the patient's 3 children Chasmine [place], [place] and [name] are also in the home. There are age 70, 94, and 24. The patient's son [name], currently 73 years old, lives in [place].    ADVANCED DIRECTIVES: Not in place   HEALTH MAINTENANCE: Social History  Substance Use Topics  . Smoking status: Current Every Day Smoker -- 0.25 packs/day for 20 years    Types: Cigarettes  . Smokeless tobacco: Never Used  . Alcohol Use: Yes     Comment: occ     Colonoscopy:  PAP:  Bone density:  Lipid panel:  No Known Allergies  Current Outpatient Prescriptions  Medication Sig Dispense Refill  . LORazepam (ATIVAN) 0.5 MG tablet Take 1 tablet (0.5 mg total) by mouth at bedtime. 30 tablet 0  . omeprazole (PRILOSEC) 20 MG capsule Take 1 capsule (20 mg total) by mouth daily. 30 capsule 3   No current facility-administered medications for this visit.    OBJECTIVE: [name] in no acute distress  Filed Vitals:   06/23/16 1108  BP: 115/78  Pulse: 88  Temp: 98.2 F (36.8 C)  Resp: 18     Body mass index is 33.45 kg/(m^2).    ECOG FS:1 - Symptomatic but completely ambulatory  Sclerae unicteric, pupils round and equal Oropharynx clear and moist-- no thrush or other lesions No cervical or supraclavicular adenopathy Lungs no rales or rhonchi Heart regular rate and rhythm Abd soft,  nontender, positive bowel sounds MSK no focal spinal tenderness to vigorous palpation and percussion, no upper extremity lymphedema Neuro: nonfocal, well oriented, appropriate affect Breasts: Deferred   LAB RESULTS:  CMP     Component Value Date/Time   NA 142 06/16/2016 1106   NA 137 04/11/2016 1045   K 4.0 06/16/2016 1106   K 3.4* 04/11/2016 1045   CL 106 04/11/2016 1045   CO2 25 06/16/2016 1106   CO2 25 04/11/2016 1045   GLUCOSE 90 06/16/2016 1106   GLUCOSE 84 04/11/2016 1045   BUN 8.1 06/16/2016 1106   BUN 5* 04/11/2016 1045   CREATININE 0.8 06/16/2016 1106   CREATININE 0.78 04/11/2016 1045   CREATININE 0.75 01/12/2016 1115   CALCIUM 9.4 06/16/2016 1106   CALCIUM 8.6* 04/11/2016 1045   PROT 7.4 06/16/2016 1106   PROT 7.1 01/12/2016 1115   ALBUMIN 3.9 06/16/2016 1106   ALBUMIN 4.2 01/12/2016 1115   AST 14 06/16/2016 1106   AST 12 01/12/2016 1115   ALT 10 06/16/2016 1106   ALT  8 01/12/2016 1115   ALKPHOS 63 06/16/2016 1106   ALKPHOS 47 01/12/2016 1115   BILITOT 0.40 06/16/2016 1106   BILITOT 0.4 01/12/2016 1115   GFRNONAA >60 04/11/2016 1045   GFRNONAA >89 01/12/2016 1115   GFRAA >60 04/11/2016 1045   GFRAA >89 01/12/2016 1115    INo results found for: SPEP, UPEP  Lab Results  Component Value Date   WBC 6.7 06/23/2016   NEUTROABS 5.0 06/23/2016   HGB 8.6* 06/23/2016   HCT 24.9* 06/23/2016   MCV 91.5 06/23/2016   PLT 161 06/23/2016      Chemistry      Component Value Date/Time   NA 142 06/16/2016 1106   NA 137 04/11/2016 1045   K 4.0 06/16/2016 1106   K 3.4* 04/11/2016 1045   CL 106 04/11/2016 1045   CO2 25 06/16/2016 1106   CO2 25 04/11/2016 1045   BUN 8.1 06/16/2016 1106   BUN 5* 04/11/2016 1045   CREATININE 0.8 06/16/2016 1106   CREATININE 0.78 04/11/2016 1045   CREATININE 0.75 01/12/2016 1115      Component Value Date/Time   CALCIUM 9.4 06/16/2016 1106   CALCIUM 8.6* 04/11/2016 1045   ALKPHOS 63 06/16/2016 1106   ALKPHOS 47 01/12/2016  1115   AST 14 06/16/2016 1106   AST 12 01/12/2016 1115   ALT 10 06/16/2016 1106   ALT 8 01/12/2016 1115   BILITOT 0.40 06/16/2016 1106   BILITOT 0.4 01/12/2016 1115       No results found for: LABCA2  No components found for: LABCA125  No results for input(s): INR in the last 168 hours.  Urinalysis    Component Value Date/Time   LABSPEC 1.020 01/12/2016 1122   PHURINE 5.5 01/12/2016 1122   GLUCOSEU NEGATIVE 01/12/2016 1122   HGBUR TRACE* 01/12/2016 1122   BILIRUBINUR NEGATIVE 01/12/2016 1122   KETONESUR NEGATIVE 01/12/2016 1122   PROTEINUR NEGATIVE 01/12/2016 1122   UROBILINOGEN 0.2 01/12/2016 1122   NITRITE NEGATIVE 01/12/2016 1122   LEUKOCYTESUR NEGATIVE 01/12/2016 1122    ELIGIBLE FOR AVAILABLE RESEARCH PROTOCOL: ASA, B-WELL  STUDIES: No results found.  ASSESSMENT: 40 y.o. [place] woman status post right breast upper-outer quadrant biopsy 01/28/2016 for a clinical T2 N0 invasive ductal carcinoma, grade 3, triple negative, with an MIB-1 of 70%.  (a) suspicious right axillary lymph node biopsied 01/28/2016 was benign  (1) neoadjuvant chemotherapy: doxorubicin and cyclophosphamide in dose dense fashion 4 started 04/14/16, completed 05/26/2016, followed by paclitaxel and carboplatin weekly 12, Started 06/09/2016  (2) genetics testing is pending  (3) definitive surgery to follow  (5) adjuvant radiation to follow as appropriate  PLAN: Aaminah is doing fairly well as far as tolerating the current treatment is concerned and we are proceeding with the third of 12 planned doses.  I'm setting her up for genetics testing, which should be done before her definitive surgery.  She will see me again next week. She knows to call for any problems that may develop before the next visit.   Chauncey Cruel, MD   06/23/2016 11:40 AM

## 2016-06-23 NOTE — Patient Instructions (Signed)
[facility] Discharge Instructions for Patients Receiving Chemotherapy  Today you received the following chemotherapy agents Taxol and Carboplatin  To help prevent nausea and vomiting after your treatment, we encourage you to take your nausea medication     If you develop nausea and vomiting that is not controlled by your nausea medication, call the clinic.   BELOW ARE SYMPTOMS THAT SHOULD BE REPORTED IMMEDIATELY:  *FEVER GREATER THAN 100.5 F  *CHILLS WITH OR WITHOUT FEVER  NAUSEA AND VOMITING THAT IS NOT CONTROLLED WITH YOUR NAUSEA MEDICATION  *UNUSUAL SHORTNESS OF BREATH  *UNUSUAL BRUISING OR BLEEDING  TENDERNESS IN MOUTH AND THROAT WITH OR WITHOUT PRESENCE OF ULCERS  *URINARY PROBLEMS  *BOWEL PROBLEMS  UNUSUAL RASH Items with * indicate a potential emergency and should be followed up as soon as possible.  Feel free to call the clinic you have any questions or concerns. The clinic phone number is [phone].  Please show the CHEMO ALERT CARD at check-in to the Emergency Department and triage nurse.   

## 2016-06-23 NOTE — Telephone Encounter (Signed)
Left pt message to resch appt w/ ECHO

## 2016-06-23 NOTE — Progress Notes (Signed)
[place] Work  Clinical Social Work was referred by patient navigator for assistance with bus pass.  Clinical Social Worker met with patient at [facility] in infusion. Pt reports she just needs 31 day bus pass. CSW assisted as pt has ongoing need for transportation. Pt reported she was doing well and denied other needs. Pt agrees to reach out as needed.    Clinical Social Work interventions: Transportation assistance   Loren Racer, [place] Social Worker [place]  [place] Phone: [phone] Fax: [phone]

## 2016-06-28 ENCOUNTER — Other Ambulatory Visit: Payer: Self-pay | Admitting: *Deleted

## 2016-06-28 ENCOUNTER — Telehealth (HOSPITAL_COMMUNITY): Payer: Self-pay | Admitting: Vascular Surgery

## 2016-06-28 NOTE — Telephone Encounter (Signed)
Unable to contact pt, will send no show letter

## 2016-06-30 ENCOUNTER — Other Ambulatory Visit: Payer: Medicaid Other

## 2016-06-30 ENCOUNTER — Telehealth (HOSPITAL_COMMUNITY): Payer: Self-pay | Admitting: Vascular Surgery

## 2016-06-30 ENCOUNTER — Ambulatory Visit: Payer: Medicaid Other

## 2016-06-30 ENCOUNTER — Ambulatory Visit: Payer: Medicaid Other | Admitting: Oncology

## 2016-06-30 ENCOUNTER — Telehealth: Payer: Self-pay | Admitting: Oncology

## 2016-06-30 NOTE — Telephone Encounter (Signed)
R/s appts per desk nurse orders. lvm to inform pt of new appts date/times

## 2016-06-30 NOTE — Telephone Encounter (Signed)
Left pt message to reschedule f/u appt w/ echo

## 2016-07-01 ENCOUNTER — Other Ambulatory Visit (HOSPITAL_BASED_OUTPATIENT_CLINIC_OR_DEPARTMENT_OTHER): Payer: Medicaid Other

## 2016-07-01 ENCOUNTER — Ambulatory Visit (HOSPITAL_BASED_OUTPATIENT_CLINIC_OR_DEPARTMENT_OTHER): Payer: Medicaid Other

## 2016-07-01 ENCOUNTER — Encounter: Payer: Self-pay | Admitting: *Deleted

## 2016-07-01 VITALS — BP 138/65 | HR 84 | Temp 98.7°F | Resp 19

## 2016-07-01 DIAGNOSIS — C50411 Malignant neoplasm of upper-outer quadrant of right female breast: Secondary | ICD-10-CM

## 2016-07-01 DIAGNOSIS — Z5111 Encounter for antineoplastic chemotherapy: Secondary | ICD-10-CM

## 2016-07-01 LAB — CBC WITH DIFFERENTIAL/PLATELET
BASO%: 0.8 % (ref 0.0–2.0)
BASOS ABS: 0.1 10*3/uL (ref 0.0–0.1)
EOS%: 1.2 % (ref 0.0–7.0)
Eosinophils Absolute: 0.1 10*3/uL (ref 0.0–0.5)
HEMATOCRIT: 27.3 % — AB (ref 34.8–46.6)
HGB: 9.1 g/dL — ABNORMAL LOW (ref 11.6–15.9)
LYMPH%: 15.3 % (ref 14.0–49.7)
MCH: 32 pg (ref 25.1–34.0)
MCHC: 33.2 g/dL (ref 31.5–36.0)
MCV: 96.5 fL (ref 79.5–101.0)
MONO#: 0.6 10*3/uL (ref 0.1–0.9)
MONO%: 8 % (ref 0.0–14.0)
NEUT#: 5.4 10*3/uL (ref 1.5–6.5)
NEUT%: 74.7 % (ref 38.4–76.8)
PLATELETS: 154 10*3/uL (ref 145–400)
RBC: 2.83 10*6/uL — AB (ref 3.70–5.45)
RDW: 19.4 % — AB (ref 11.2–14.5)
WBC: 7.3 10*3/uL (ref 3.9–10.3)
lymph#: 1.1 10*3/uL (ref 0.9–3.3)

## 2016-07-01 LAB — COMPREHENSIVE METABOLIC PANEL
ALT: 13 U/L (ref 0–55)
ANION GAP: 10 meq/L (ref 3–11)
AST: 12 U/L (ref 5–34)
Albumin: 3.9 g/dL (ref 3.5–5.0)
Alkaline Phosphatase: 55 U/L (ref 40–150)
BUN: 11.5 mg/dL (ref 7.0–26.0)
CALCIUM: 9.3 mg/dL (ref 8.4–10.4)
CHLORIDE: 109 meq/L (ref 98–109)
CO2: 24 meq/L (ref 22–29)
CREATININE: 0.8 mg/dL (ref 0.6–1.1)
Glucose: 83 mg/dl (ref 70–140)
Potassium: 3.5 mEq/L (ref 3.5–5.1)
Sodium: 143 mEq/L (ref 136–145)
Total Bilirubin: <0.3 mg/dL (ref 0.20–1.20)
Total Protein: 7.3 g/dL (ref 6.4–8.3)

## 2016-07-01 MED ORDER — DIPHENHYDRAMINE HCL 50 MG/ML IJ SOLN
25.0000 mg | Freq: Once | INTRAMUSCULAR | Status: AC
  Administered 2016-07-01: 25 mg via INTRAVENOUS

## 2016-07-01 MED ORDER — SODIUM CHLORIDE 0.9 % IV SOLN
285.2000 mg | Freq: Once | INTRAVENOUS | Status: AC
  Administered 2016-07-01: 290 mg via INTRAVENOUS
  Filled 2016-07-01: qty 29

## 2016-07-01 MED ORDER — SODIUM CHLORIDE 0.9% FLUSH
10.0000 mL | INTRAVENOUS | Status: DC | PRN
  Administered 2016-07-01: 10 mL
  Filled 2016-07-01: qty 10

## 2016-07-01 MED ORDER — DIPHENHYDRAMINE HCL 50 MG/ML IJ SOLN
INTRAMUSCULAR | Status: AC
  Filled 2016-07-01: qty 1

## 2016-07-01 MED ORDER — PALONOSETRON HCL INJECTION 0.25 MG/5ML: 0.2500 mg | Freq: Once | INTRAVENOUS | Status: DC

## 2016-07-01 MED ORDER — SODIUM CHLORIDE 0.9 % IV SOLN
80.0000 mg/m2 | Freq: Once | INTRAVENOUS | Status: AC
  Administered 2016-07-01: 150 mg via INTRAVENOUS
  Filled 2016-07-01: qty 25

## 2016-07-01 MED ORDER — FAMOTIDINE IN NACL 20-0.9 MG/50ML-% IV SOLN
INTRAVENOUS | Status: AC
  Filled 2016-07-01: qty 50

## 2016-07-01 MED ORDER — SODIUM CHLORIDE 0.9 % IV SOLN
Freq: Once | INTRAVENOUS | Status: AC
  Administered 2016-07-01: 14:00:00 via INTRAVENOUS

## 2016-07-01 MED ORDER — SODIUM CHLORIDE 0.9 % IV SOLN
20.0000 mg | Freq: Once | INTRAVENOUS | Status: AC
  Administered 2016-07-01: 20 mg via INTRAVENOUS
  Filled 2016-07-01: qty 2

## 2016-07-01 MED ORDER — FAMOTIDINE IN NACL 20-0.9 MG/50ML-% IV SOLN
20.0000 mg | Freq: Once | INTRAVENOUS | Status: AC
Start: 2016-07-01 — End: 2016-07-01
  Administered 2016-07-01: 20 mg via INTRAVENOUS

## 2016-07-01 MED ORDER — HEPARIN SOD (PORK) LOCK FLUSH 100 UNIT/ML IV SOLN
500.0000 [IU] | Freq: Once | INTRAVENOUS | Status: AC | PRN
  Administered 2016-07-01: 500 [IU]
  Filled 2016-07-01: qty 5

## 2016-07-01 NOTE — Patient Instructions (Signed)
[facility] Discharge Instructions for Patients Receiving Chemotherapy  Today you received the following chemotherapy agents Taxol and Carboplatin. To help prevent nausea and vomiting after your treatment, we encourage you to take your nausea medication as directed.  If you develop nausea and vomiting that is not controlled by your nausea medication, call the clinic.   BELOW ARE SYMPTOMS THAT SHOULD BE REPORTED IMMEDIATELY:  *FEVER GREATER THAN 100.5 F  *CHILLS WITH OR WITHOUT FEVER  NAUSEA AND VOMITING THAT IS NOT CONTROLLED WITH YOUR NAUSEA MEDICATION  *UNUSUAL SHORTNESS OF BREATH  *UNUSUAL BRUISING OR BLEEDING  TENDERNESS IN MOUTH AND THROAT WITH OR WITHOUT PRESENCE OF ULCERS  *URINARY PROBLEMS  *BOWEL PROBLEMS  UNUSUAL RASH Items with * indicate a potential emergency and should be followed up as soon as possible.  Feel free to call the clinic you have any questions or concerns. The clinic phone number is [phone].  Please show the CHEMO ALERT CARD at check-in to the Emergency Department and triage nurse.    

## 2016-07-04 ENCOUNTER — Telehealth: Payer: Self-pay | Admitting: Oncology

## 2016-07-04 NOTE — Telephone Encounter (Signed)
Sent e-mail to administrator to override appts on GM schedule for selected dates. Pt aware of 8/3 appt date/times

## 2016-07-04 NOTE — Telephone Encounter (Signed)
Was unable to reach pt and leave voicemail to inform pt of 8/3 appt times per pof

## 2016-07-07 ENCOUNTER — Other Ambulatory Visit: Payer: Self-pay | Admitting: Oncology

## 2016-07-07 ENCOUNTER — Ambulatory Visit: Payer: Medicaid Other

## 2016-07-07 ENCOUNTER — Ambulatory Visit (HOSPITAL_BASED_OUTPATIENT_CLINIC_OR_DEPARTMENT_OTHER): Payer: Medicaid Other

## 2016-07-07 ENCOUNTER — Other Ambulatory Visit (HOSPITAL_BASED_OUTPATIENT_CLINIC_OR_DEPARTMENT_OTHER): Payer: Medicaid Other

## 2016-07-07 VITALS — BP 109/76 | HR 87 | Temp 98.6°F | Resp 18

## 2016-07-07 DIAGNOSIS — C50411 Malignant neoplasm of upper-outer quadrant of right female breast: Secondary | ICD-10-CM

## 2016-07-07 DIAGNOSIS — Z5111 Encounter for antineoplastic chemotherapy: Secondary | ICD-10-CM

## 2016-07-07 LAB — CBC WITH DIFFERENTIAL/PLATELET
BASO%: 0.4 % (ref 0.0–2.0)
BASOS ABS: 0 10*3/uL (ref 0.0–0.1)
EOS ABS: 0.1 10*3/uL (ref 0.0–0.5)
EOS%: 1.2 % (ref 0.0–7.0)
HCT: 25.3 % — ABNORMAL LOW (ref 34.8–46.6)
HGB: 8.6 g/dL — ABNORMAL LOW (ref 11.6–15.9)
LYMPH%: 19.8 % (ref 14.0–49.7)
MCH: 32 pg (ref 25.1–34.0)
MCHC: 34 g/dL (ref 31.5–36.0)
MCV: 94.1 fL (ref 79.5–101.0)
MONO#: 0.3 10*3/uL (ref 0.1–0.9)
MONO%: 4.8 % (ref 0.0–14.0)
NEUT#: 3.8 10*3/uL (ref 1.5–6.5)
NEUT%: 73.8 % (ref 38.4–76.8)
PLATELETS: 102 10*3/uL — AB (ref 145–400)
RBC: 2.69 10*6/uL — AB (ref 3.70–5.45)
RDW: 18.1 % — ABNORMAL HIGH (ref 11.2–14.5)
WBC: 5.2 10*3/uL (ref 3.9–10.3)
lymph#: 1 10*3/uL (ref 0.9–3.3)

## 2016-07-07 LAB — COMPREHENSIVE METABOLIC PANEL
ALBUMIN: 3.7 g/dL (ref 3.5–5.0)
ALK PHOS: 53 U/L (ref 40–150)
ALT: 13 U/L (ref 0–55)
AST: 14 U/L (ref 5–34)
Anion Gap: 8 mEq/L (ref 3–11)
BILIRUBIN TOTAL: 0.52 mg/dL (ref 0.20–1.20)
BUN: 9.1 mg/dL (ref 7.0–26.0)
CO2: 28 mEq/L (ref 22–29)
Calcium: 9.5 mg/dL (ref 8.4–10.4)
Chloride: 108 mEq/L (ref 98–109)
Creatinine: 0.9 mg/dL (ref 0.6–1.1)
GLUCOSE: 87 mg/dL (ref 70–140)
Potassium: 4 mEq/L (ref 3.5–5.1)
SODIUM: 143 meq/L (ref 136–145)
TOTAL PROTEIN: 7.1 g/dL (ref 6.4–8.3)

## 2016-07-07 MED ORDER — SODIUM CHLORIDE 0.9 % IV SOLN
259.0000 mg | Freq: Once | INTRAVENOUS | Status: AC
  Administered 2016-07-07: 260 mg via INTRAVENOUS
  Filled 2016-07-07: qty 26

## 2016-07-07 MED ORDER — DIPHENHYDRAMINE HCL 50 MG/ML IJ SOLN
25.0000 mg | Freq: Once | INTRAMUSCULAR | Status: AC
  Administered 2016-07-07: 25 mg via INTRAVENOUS

## 2016-07-07 MED ORDER — FAMOTIDINE IN NACL 20-0.9 MG/50ML-% IV SOLN
INTRAVENOUS | Status: AC
  Filled 2016-07-07: qty 50

## 2016-07-07 MED ORDER — SODIUM CHLORIDE 0.9 % IV SOLN
20.0000 mg | Freq: Once | INTRAVENOUS | Status: AC
  Administered 2016-07-07: 20 mg via INTRAVENOUS
  Filled 2016-07-07: qty 2

## 2016-07-07 MED ORDER — SODIUM CHLORIDE 0.9 % IV SOLN
80.0000 mg/m2 | Freq: Once | INTRAVENOUS | Status: AC
  Administered 2016-07-07: 150 mg via INTRAVENOUS
  Filled 2016-07-07: qty 25

## 2016-07-07 MED ORDER — PALONOSETRON HCL INJECTION 0.25 MG/5ML: 0.2500 mg | Freq: Once | INTRAVENOUS | Status: DC

## 2016-07-07 MED ORDER — DIPHENHYDRAMINE HCL 50 MG/ML IJ SOLN
INTRAMUSCULAR | Status: AC
  Filled 2016-07-07: qty 1

## 2016-07-07 MED ORDER — HEPARIN SOD (PORK) LOCK FLUSH 100 UNIT/ML IV SOLN
500.0000 [IU] | Freq: Once | INTRAVENOUS | Status: AC | PRN
  Administered 2016-07-07: 500 [IU]
  Filled 2016-07-07: qty 5

## 2016-07-07 MED ORDER — SODIUM CHLORIDE 0.9% FLUSH
10.0000 mL | INTRAVENOUS | Status: DC | PRN
  Administered 2016-07-07: 10 mL
  Filled 2016-07-07: qty 10

## 2016-07-07 MED ORDER — FAMOTIDINE IN NACL 20-0.9 MG/50ML-% IV SOLN
20.0000 mg | Freq: Once | INTRAVENOUS | Status: AC
  Administered 2016-07-07: 20 mg via INTRAVENOUS

## 2016-07-07 MED ORDER — SODIUM CHLORIDE 0.9 % IV SOLN
Freq: Once | INTRAVENOUS | Status: AC
  Administered 2016-07-07: 12:00:00 via INTRAVENOUS

## 2016-07-07 NOTE — Patient Instructions (Signed)
[facility] Discharge Instructions for Patients Receiving Chemotherapy  Today you received the following chemotherapy agents Taxol/Carboplatin  To help prevent nausea and vomiting after your treatment, we encourage you to take your nausea medication    If you develop nausea and vomiting that is not controlled by your nausea medication, call the clinic.   BELOW ARE SYMPTOMS THAT SHOULD BE REPORTED IMMEDIATELY:  *FEVER GREATER THAN 100.5 F  *CHILLS WITH OR WITHOUT FEVER  NAUSEA AND VOMITING THAT IS NOT CONTROLLED WITH YOUR NAUSEA MEDICATION  *UNUSUAL SHORTNESS OF BREATH  *UNUSUAL BRUISING OR BLEEDING  TENDERNESS IN MOUTH AND THROAT WITH OR WITHOUT PRESENCE OF ULCERS  *URINARY PROBLEMS  *BOWEL PROBLEMS  UNUSUAL RASH Items with * indicate a potential emergency and should be followed up as soon as possible.  Feel free to call the clinic you have any questions or concerns. The clinic phone number is [phone].  Please show the CHEMO ALERT CARD at check-in to the Emergency Department and triage nurse.   

## 2016-07-07 NOTE — Progress Notes (Signed)
Okay to treat today despite Platelets = 102, per Dr. Marko Plume.

## 2016-07-14 ENCOUNTER — Ambulatory Visit (HOSPITAL_BASED_OUTPATIENT_CLINIC_OR_DEPARTMENT_OTHER): Payer: Medicaid Other

## 2016-07-14 ENCOUNTER — Ambulatory Visit (HOSPITAL_BASED_OUTPATIENT_CLINIC_OR_DEPARTMENT_OTHER): Payer: Medicaid Other | Admitting: Oncology

## 2016-07-14 ENCOUNTER — Other Ambulatory Visit (HOSPITAL_BASED_OUTPATIENT_CLINIC_OR_DEPARTMENT_OTHER): Payer: Medicaid Other

## 2016-07-14 VITALS — BP 118/89 | HR 90 | Temp 97.7°F | Resp 18 | Ht 61.5 in | Wt 178.1 lb

## 2016-07-14 DIAGNOSIS — G8929 Other chronic pain: Secondary | ICD-10-CM

## 2016-07-14 DIAGNOSIS — R21 Rash and other nonspecific skin eruption: Secondary | ICD-10-CM

## 2016-07-14 DIAGNOSIS — Z5111 Encounter for antineoplastic chemotherapy: Secondary | ICD-10-CM | POA: Diagnosis not present

## 2016-07-14 DIAGNOSIS — C50411 Malignant neoplasm of upper-outer quadrant of right female breast: Secondary | ICD-10-CM

## 2016-07-14 DIAGNOSIS — M545 Low back pain: Secondary | ICD-10-CM | POA: Diagnosis not present

## 2016-07-14 DIAGNOSIS — Z720 Tobacco use: Secondary | ICD-10-CM

## 2016-07-14 DIAGNOSIS — Z171 Estrogen receptor negative status [ER-]: Secondary | ICD-10-CM

## 2016-07-14 LAB — COMPREHENSIVE METABOLIC PANEL
ALT: 14 U/L (ref 0–55)
ANION GAP: 10 meq/L (ref 3–11)
AST: 16 U/L (ref 5–34)
Albumin: 4 g/dL (ref 3.5–5.0)
Alkaline Phosphatase: 63 U/L (ref 40–150)
BUN: 7.7 mg/dL (ref 7.0–26.0)
CO2: 23 meq/L (ref 22–29)
CREATININE: 0.8 mg/dL (ref 0.6–1.1)
Calcium: 9.9 mg/dL (ref 8.4–10.4)
Chloride: 107 mEq/L (ref 98–109)
EGFR: >90 mL/min/{1.73_m2} (ref >90)
GLUCOSE: 90 mg/dL (ref 70–140)
Potassium: 3.8 mEq/L (ref 3.5–5.1)
Sodium: 140 mEq/L (ref 136–145)
TOTAL PROTEIN: 7.9 g/dL (ref 6.4–8.3)

## 2016-07-14 LAB — CBC WITH DIFFERENTIAL/PLATELET
BASO%: 0.3 % (ref 0.0–2.0)
Basophils Absolute: 0 10*3/uL (ref 0.0–0.1)
EOS%: 0.9 % (ref 0.0–7.0)
Eosinophils Absolute: 0.1 10*3/uL (ref 0.0–0.5)
HCT: 27.7 % — ABNORMAL LOW (ref 34.8–46.6)
HEMOGLOBIN: 9.5 g/dL — AB (ref 11.6–15.9)
LYMPH#: 1.1 10*3/uL (ref 0.9–3.3)
LYMPH%: 19 % (ref 14.0–49.7)
MCH: 32.9 pg (ref 25.1–34.0)
MCHC: 34.3 g/dL (ref 31.5–36.0)
MCV: 95.8 fL (ref 79.5–101.0)
MONO#: 0.4 10*3/uL (ref 0.1–0.9)
MONO%: 6.7 % (ref 0.0–14.0)
NEUT%: 73.1 % (ref 38.4–76.8)
NEUTROS ABS: 4.3 10*3/uL (ref 1.5–6.5)
PLATELETS: 125 10*3/uL — AB (ref 145–400)
RBC: 2.89 10*6/uL — AB (ref 3.70–5.45)
RDW: 17.9 % — AB (ref 11.2–14.5)
WBC: 5.9 10*3/uL (ref 3.9–10.3)

## 2016-07-14 MED ORDER — SODIUM CHLORIDE 0.9 % IV SOLN
80.0000 mg/m2 | Freq: Once | INTRAVENOUS | Status: AC
  Administered 2016-07-14: 150 mg via INTRAVENOUS
  Filled 2016-07-14: qty 25

## 2016-07-14 MED ORDER — DIPHENHYDRAMINE HCL 50 MG/ML IJ SOLN
INTRAMUSCULAR | Status: AC
  Filled 2016-07-14: qty 1

## 2016-07-14 MED ORDER — SODIUM CHLORIDE 0.9% FLUSH
10.0000 mL | INTRAVENOUS | Status: DC | PRN
  Administered 2016-07-14: 10 mL
  Filled 2016-07-14: qty 10

## 2016-07-14 MED ORDER — FAMOTIDINE IN NACL 20-0.9 MG/50ML-% IV SOLN
INTRAVENOUS | Status: AC
  Filled 2016-07-14: qty 50

## 2016-07-14 MED ORDER — VENLAFAXINE HCL ER 75 MG PO CP24: 75.0000 mg | ORAL_CAPSULE | Freq: Every day | ORAL | 6 refills | Status: DC

## 2016-07-14 MED ORDER — FAMOTIDINE IN NACL 20-0.9 MG/50ML-% IV SOLN
20.0000 mg | Freq: Once | INTRAVENOUS | Status: AC
  Administered 2016-07-14: 20 mg via INTRAVENOUS

## 2016-07-14 MED ORDER — PALONOSETRON HCL INJECTION 0.25 MG/5ML
INTRAVENOUS | Status: AC
  Filled 2016-07-14: qty 5

## 2016-07-14 MED ORDER — HEPARIN SOD (PORK) LOCK FLUSH 100 UNIT/ML IV SOLN
500.0000 [IU] | Freq: Once | INTRAVENOUS | Status: AC | PRN
  Administered 2016-07-14: 500 [IU]
  Filled 2016-07-14: qty 5

## 2016-07-14 MED ORDER — DEXAMETHASONE SODIUM PHOSPHATE 100 MG/10ML IJ SOLN
10.0000 mg | Freq: Once | INTRAMUSCULAR | Status: AC
  Administered 2016-07-14: 10 mg via INTRAVENOUS
  Filled 2016-07-14: qty 1

## 2016-07-14 MED ORDER — DIPHENHYDRAMINE HCL 50 MG/ML IJ SOLN
25.0000 mg | Freq: Once | INTRAMUSCULAR | Status: AC
  Administered 2016-07-14: 25 mg via INTRAVENOUS

## 2016-07-14 MED ORDER — SODIUM CHLORIDE 0.9 % IV SOLN
Freq: Once | INTRAVENOUS | Status: AC
  Administered 2016-07-14: 09:00:00 via INTRAVENOUS

## 2016-07-14 MED ORDER — KETOCONAZOLE 2 % EX CREA: 1.0000 "application " | TOPICAL_CREAM | Freq: Every day | CUTANEOUS | 0 refills | Status: DC

## 2016-07-14 MED ORDER — SODIUM CHLORIDE 0.9 % IV SOLN
285.2000 mg | Freq: Once | INTRAVENOUS | Status: AC
  Administered 2016-07-14: 290 mg via INTRAVENOUS
  Filled 2016-07-14: qty 29

## 2016-07-14 NOTE — Patient Instructions (Signed)
[facility] Discharge Instructions for Patients Receiving Chemotherapy  Today you received the following chemotherapy agents Taxol/Carboplatin To help prevent nausea and vomiting after your treatment, we encourage you to take your nausea medication as prescribed.   If you develop nausea and vomiting that is not controlled by your nausea medication, call the clinic.   BELOW ARE SYMPTOMS THAT SHOULD BE REPORTED IMMEDIATELY:  *FEVER GREATER THAN 100.5 F  *CHILLS WITH OR WITHOUT FEVER  NAUSEA AND VOMITING THAT IS NOT CONTROLLED WITH YOUR NAUSEA MEDICATION  *UNUSUAL SHORTNESS OF BREATH  *UNUSUAL BRUISING OR BLEEDING  TENDERNESS IN MOUTH AND THROAT WITH OR WITHOUT PRESENCE OF ULCERS  *URINARY PROBLEMS  *BOWEL PROBLEMS  UNUSUAL RASH Items with * indicate a potential emergency and should be followed up as soon as possible.  Feel free to call the clinic you have any questions or concerns. The clinic phone number is [phone].  Please show the CHEMO ALERT CARD at check-in to the Emergency Department and triage nurse.   

## 2016-07-14 NOTE — Progress Notes (Signed)
Rebecca [name]  [phone] [phone]) [phone]   ID: Unknown Rebecca [name] DOB: 12/16/1975  MR#: [id number]  ONG#:[id number]  Patient Care Team: Dorena Dew, FNP as PCP - General (Family Medicine) Alphonsa Overall, MD as Consulting Physician (General Surgery) Chauncey Cruel, MD as Consulting Physician (Oncology) Gery Pray, MD as Consulting Physician (Radiation Oncology) Sylvan Cheese, NP as Nurse Practitioner (Hematology and Oncology) Benson Norway, RN as Registered Nurse PCP: Dorena Dew, FNP GYN: OTHER MD:  CHIEF COMPLAINT: triple negative breast cancer  [name] TREATMENT: neoadjuvant chemotherapy  BREAST CANCER HISTORY: From the original intake note:  Rebecca [name] herself noted a change in her right breast sometime around September or October 2016. She did not bring it to intermediate medical attention, but on 01/13/2016 she established herself in Dr. Smith Robert' service and she was set up for bilateral diagnostic mammography with tomosynthesis and bilateral ultrasonography at the [facility] 01/19/2016. The breast density was category B. In the upper outer quadrant of the right breast there was a spiculated mass measuring 2.8 cm. On physical exam this was palpable. Targeted ultrasonography confirmed an irregular hypoechoic mass in the right breast 11:30 o'clock position measuring 2.6 cm maximally. Ultrasound of the right axilla showed a morphologically abnormal lymph node.  In the left breast there were some tubular densities behind the areola which by ultrasonography showed benign ductal ectasia.  On 01/28/2016 [name] underwent biopsy of the right breast mass and abnormal right axillary lymph node. The pathology from this procedure (S [id number]) showed the lymph node to be benign. In the breast however there was an invasive ductal carcinoma, grade 3, which was estrogen and progesterone receptor negative. The proliferation marker was 70%. HER-2 was not  amplified with a signals ratio of 1.32. The number per cell was 2.05.  The patient's subsequent history is as detailed below  INTERVAL HISTORY: Raney returns today for follow-up of her early stage breast cancer accompanied by her mother. Today is day 1, cycle 6 of 12 planned cycles of paclitaxel and carboplatin given weekly.   REVIEW OF SYSTEMS: Rebecca [name] continues to do generally tolerated her treatments well. Specifically she denies any peripheral neuropathy symptoms. She does have significant hot flashes and cold flashes. She says this is constant. She can't sleep as a result. She feels fatigued. She has a little bit of a runny nose and other sinus symptoms. She has rare nausea, but no vomiting. She has chronic back pain. She feels anxious and depressed. She has developed a groin rash that she wanted me to look at. A detailed review of systems today was otherwise stable   PAST MEDICAL HISTORY: Past Medical History:  Diagnosis Date  . Anxiety   . Breast cancer ([place])   . Depression   . GERD (gastroesophageal reflux disease)   . Hypertension    pt states is currently on no medications   . Obesity (BMI 35.0-39.9 without comorbidity) ([place])   . Seasonal allergies   . Sickle cell trait ([place])   . Termination of pregnancy (fetus) 04/02/16    PAST SURGICAL HISTORY: Past Surgical History:  Procedure Laterality Date  . CESAREAN SECTION     2004 and 2007    FAMILY HISTORY Family History  Problem Relation Age of Onset  . Hypertension Mother   . Uterine cancer Mother   . Cancer Father   . Hypertension Father   . Breast cancer Maternal Aunt   . Prostate cancer Maternal Uncle   . Breast cancer Maternal Grandmother   .  Throat cancer Maternal Grandfather   . Breast cancer Cousin   The patient has very little information about her father. Her mother is currently 33 years old. She had a history of cervical cancer at age 77. The patient had 2 brothers, no sisters. The maternal grandfather had  throat cancer. A maternal uncle was diagnosed with breast cancer as well as prostate cancer at the age of 3. 2 maternal cousins, one of the mail, had breast cancer as well.  GYNECOLOGIC HISTORY:  No LMP recorded. Menarche age 61, first live birth age 72. The patient is GX P4. She still having regular periods. She took oral contraceptives in the 1990s with no side effects.  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 The patient's significant other [name] works at [employer]. In addition to them the patient's 3 children Rebecca [name], Rebecca [name] and Rebecca [name] are also in the home. There are age 38, 14, and 53 as of August 2017. The patient's son Rebecca [name], currently 62 years old, lives in [place].    ADVANCED DIRECTIVES: Not in place   HEALTH MAINTENANCE: Social History  Substance Use Topics  . Smoking status: [name] Every Day Smoker    Packs/day: 0.25    Years: 20.00    Types: Cigarettes  . Smokeless tobacco: Never Used  . Alcohol use Yes     Comment: occ     Colonoscopy:  PAP:  Bone density:  Lipid panel:  No Known Allergies  [name] Outpatient Prescriptions  Medication Sig Dispense Refill  . LORazepam (ATIVAN) 0.5 MG tablet Take 1 tablet (0.5 mg total) by mouth at bedtime. 30 tablet 0  . omeprazole (PRILOSEC) 20 MG capsule Take 1 capsule (20 mg total) by mouth daily. 30 capsule 3   No [name] facility-administered medications for this visit.     OBJECTIVE: [name] Who appears stated age Vitals:   07/14/16 0823  BP: 118/89  Pulse: 90  Resp: 18  Temp: 97.7 F (36.5 C)     Body mass index is 33.11 kg/m.    ECOG FS:0 - Asymptomatic Mother present during exam   Sclerae unicteric, EOMs intact Oropharynx clear and moist No cervical or supraclavicular adenopathy Lungs no rales or rhonchi Heart regular rate and rhythm Abd soft, nontender, positive bowel  sounds MSK no focal spinal tenderness, no upper extremity lymphedema Neuro: nonfocal, well oriented, appropriate affect Breasts: Deferred Skin: inguinal rash bilaterally appears candidal  LAB RESULTS:  CMP     Component Value Date/Time   NA 143 07/07/2016 1110   K 4.0 07/07/2016 1110   CL 106 04/11/2016 1045   CO2 28 07/07/2016 1110   GLUCOSE 87 07/07/2016 1110   BUN 9.1 07/07/2016 1110   CREATININE 0.9 07/07/2016 1110   CALCIUM 9.5 07/07/2016 1110   PROT 7.1 07/07/2016 1110   ALBUMIN 3.7 07/07/2016 1110   AST 14 07/07/2016 1110   ALT 13 07/07/2016 1110   ALKPHOS 53 07/07/2016 1110   BILITOT 0.52 07/07/2016 1110   GFRNONAA >60 04/11/2016 1045   GFRNONAA >89 01/12/2016 1115   GFRAA >60 04/11/2016 1045   GFRAA >89 01/12/2016 1115    INo results found for: SPEP, UPEP  Lab Results  Component Value Date   WBC 5.9 07/14/2016   NEUTROABS 4.3 07/14/2016   HGB 9.5 (L) 07/14/2016   HCT 27.7 (L) 07/14/2016   MCV 95.8 07/14/2016  PLT 125 (L) 07/14/2016      Chemistry      Component Value Date/Time   NA 143 07/07/2016 1110   K 4.0 07/07/2016 1110   CL 106 04/11/2016 1045   CO2 28 07/07/2016 1110   BUN 9.1 07/07/2016 1110   CREATININE 0.9 07/07/2016 1110      Component Value Date/Time   CALCIUM 9.5 07/07/2016 1110   ALKPHOS 53 07/07/2016 1110   AST 14 07/07/2016 1110   ALT 13 07/07/2016 1110   BILITOT 0.52 07/07/2016 1110       No results found for: LABCA2  No components found for: LABCA125  No results for input(s): INR in the last 168 hours.  Urinalysis    Component Value Date/Time   LABSPEC 1.020 01/12/2016 1122   PHURINE 5.5 01/12/2016 1122   GLUCOSEU NEGATIVE 01/12/2016 1122   HGBUR TRACE (A) 01/12/2016 1122   BILIRUBINUR NEGATIVE 01/12/2016 1122   KETONESUR NEGATIVE 01/12/2016 1122   PROTEINUR NEGATIVE 01/12/2016 1122   UROBILINOGEN 0.2 01/12/2016 1122   NITRITE NEGATIVE 01/12/2016 1122   LEUKOCYTESUR NEGATIVE 01/12/2016 1122    ELIGIBLE FOR  AVAILABLE RESEARCH PROTOCOL: ASA, B-WELL  STUDIES: No results found.  ASSESSMENT: 40 y.o. [place] woman status post right breast upper-outer quadrant biopsy 01/28/2016 for a clinical T2 N0 invasive ductal carcinoma, grade 3, triple negative, with an MIB-1 of 70%.  (a) suspicious right axillary lymph node biopsied 01/28/2016 was benign  (1) neoadjuvant chemotherapy: doxorubicin and cyclophosphamide in dose dense fashion 4 started 04/14/16, completed 05/26/2016, followed by paclitaxel and carboplatin weekly 12, Started 06/09/2016  (2) genetics testing is pending  (3) definitive surgery to follow  (5) adjuvant radiation to follow as appropriate  PLAN: Shakayla continues to tolerate her chemotherapy well and we are proceeding to the sixth of 12 planned doses today.  She will be completing her treatments the third week in September, and will be ready for surgery shortly after that.  Today we reviewed the need for radiation, which will follow surgery  She knows to call for any problems that may develop before her next visit here, which will be in 7 days.   Chauncey Cruel, MD   07/14/2016 8:31 AM

## 2016-07-14 NOTE — Addendum Note (Signed)
Addended by: Chauncey Cruel on: 07/14/2016 09:24 AM   Modules accepted: Orders

## 2016-07-21 ENCOUNTER — Ambulatory Visit (HOSPITAL_BASED_OUTPATIENT_CLINIC_OR_DEPARTMENT_OTHER): Payer: Medicaid Other | Admitting: Oncology

## 2016-07-21 ENCOUNTER — Other Ambulatory Visit: Payer: Self-pay | Admitting: *Deleted

## 2016-07-21 ENCOUNTER — Other Ambulatory Visit (HOSPITAL_BASED_OUTPATIENT_CLINIC_OR_DEPARTMENT_OTHER): Payer: Medicaid Other

## 2016-07-21 ENCOUNTER — Ambulatory Visit (HOSPITAL_BASED_OUTPATIENT_CLINIC_OR_DEPARTMENT_OTHER): Payer: Medicaid Other

## 2016-07-21 VITALS — BP 111/78 | HR 87

## 2016-07-21 VITALS — BP 125/88 | HR 96 | Temp 98.0°F | Resp 18 | Ht 61.5 in | Wt 177.7 lb

## 2016-07-21 DIAGNOSIS — Z5111 Encounter for antineoplastic chemotherapy: Secondary | ICD-10-CM

## 2016-07-21 DIAGNOSIS — E876 Hypokalemia: Secondary | ICD-10-CM

## 2016-07-21 DIAGNOSIS — C50411 Malignant neoplasm of upper-outer quadrant of right female breast: Secondary | ICD-10-CM

## 2016-07-21 DIAGNOSIS — Z171 Estrogen receptor negative status [ER-]: Secondary | ICD-10-CM | POA: Diagnosis not present

## 2016-07-21 DIAGNOSIS — Z720 Tobacco use: Secondary | ICD-10-CM | POA: Diagnosis not present

## 2016-07-21 DIAGNOSIS — K5900 Constipation, unspecified: Secondary | ICD-10-CM | POA: Diagnosis not present

## 2016-07-21 LAB — COMPREHENSIVE METABOLIC PANEL
ALK PHOS: 57 U/L (ref 40–150)
ALT: 18 U/L (ref 0–55)
ANION GAP: 11 meq/L (ref 3–11)
AST: 17 U/L (ref 5–34)
Albumin: 3.9 g/dL (ref 3.5–5.0)
BILIRUBIN TOTAL: 0.35 mg/dL (ref 0.20–1.20)
BUN: 8.8 mg/dL (ref 7.0–26.0)
CO2: 23 meq/L (ref 22–29)
Calcium: 9.5 mg/dL (ref 8.4–10.4)
Chloride: 108 mEq/L (ref 98–109)
Creatinine: 0.8 mg/dL (ref 0.6–1.1)
Glucose: 124 mg/dl (ref 70–140)
POTASSIUM: 3.1 meq/L — AB (ref 3.5–5.1)
SODIUM: 142 meq/L (ref 136–145)
TOTAL PROTEIN: 7.5 g/dL (ref 6.4–8.3)

## 2016-07-21 LAB — CBC WITH DIFFERENTIAL/PLATELET
BASO%: 0.5 % (ref 0.0–2.0)
BASOS ABS: 0 10*3/uL (ref 0.0–0.1)
EOS%: 0.5 % (ref 0.0–7.0)
Eosinophils Absolute: 0 10*3/uL (ref 0.0–0.5)
HEMATOCRIT: 27.8 % — AB (ref 34.8–46.6)
HEMOGLOBIN: 9.3 g/dL — AB (ref 11.6–15.9)
LYMPH#: 0.9 10*3/uL (ref 0.9–3.3)
LYMPH%: 21.1 % (ref 14.0–49.7)
MCH: 33.3 pg (ref 25.1–34.0)
MCHC: 33.5 g/dL (ref 31.5–36.0)
MCV: 99.3 fL (ref 79.5–101.0)
MONO#: 0.3 10*3/uL (ref 0.1–0.9)
MONO%: 6.4 % (ref 0.0–14.0)
NEUT#: 3 10*3/uL (ref 1.5–6.5)
NEUT%: 71.5 % (ref 38.4–76.8)
PLATELETS: 137 10*3/uL — AB (ref 145–400)
RBC: 2.8 10*6/uL — AB (ref 3.70–5.45)
RDW: 18.9 % — AB (ref 11.2–14.5)
WBC: 4.2 10*3/uL (ref 3.9–10.3)

## 2016-07-21 MED ORDER — FAMOTIDINE IN NACL 20-0.9 MG/50ML-% IV SOLN
INTRAVENOUS | Status: AC
  Filled 2016-07-21: qty 50

## 2016-07-21 MED ORDER — SODIUM CHLORIDE 0.9 % IV SOLN
80.0000 mg/m2 | Freq: Once | INTRAVENOUS | Status: AC
  Administered 2016-07-21: 150 mg via INTRAVENOUS
  Filled 2016-07-21: qty 25

## 2016-07-21 MED ORDER — DIPHENHYDRAMINE HCL 50 MG/ML IJ SOLN
25.0000 mg | Freq: Once | INTRAMUSCULAR | Status: AC
  Administered 2016-07-21: 25 mg via INTRAVENOUS

## 2016-07-21 MED ORDER — SODIUM CHLORIDE 0.9 % IV SOLN
10.0000 mg | Freq: Once | INTRAVENOUS | Status: AC
  Administered 2016-07-21: 10 mg via INTRAVENOUS
  Filled 2016-07-21: qty 1

## 2016-07-21 MED ORDER — SODIUM CHLORIDE 0.9 % IV SOLN
285.2000 mg | Freq: Once | INTRAVENOUS | Status: AC
  Administered 2016-07-21: 290 mg via INTRAVENOUS
  Filled 2016-07-21: qty 29

## 2016-07-21 MED ORDER — HEPARIN SOD (PORK) LOCK FLUSH 100 UNIT/ML IV SOLN
500.0000 [IU] | Freq: Once | INTRAVENOUS | Status: AC | PRN
  Administered 2016-07-21: 500 [IU]
  Filled 2016-07-21: qty 5

## 2016-07-21 MED ORDER — DIPHENHYDRAMINE HCL 50 MG/ML IJ SOLN
INTRAMUSCULAR | Status: AC
  Filled 2016-07-21: qty 1

## 2016-07-21 MED ORDER — SODIUM CHLORIDE 0.9% FLUSH
10.0000 mL | INTRAVENOUS | Status: DC | PRN
  Administered 2016-07-21: 10 mL
  Filled 2016-07-21: qty 10

## 2016-07-21 MED ORDER — POTASSIUM CHLORIDE ER 10 MEQ PO CPCR: 10.0000 meq | ORAL_CAPSULE | Freq: Every day | ORAL | 3 refills | Status: DC

## 2016-07-21 MED ORDER — FAMOTIDINE IN NACL 20-0.9 MG/50ML-% IV SOLN
20.0000 mg | Freq: Once | INTRAVENOUS | Status: AC
  Administered 2016-07-21: 20 mg via INTRAVENOUS

## 2016-07-21 MED ORDER — SODIUM CHLORIDE 0.9 % IV SOLN
Freq: Once | INTRAVENOUS | Status: AC
  Administered 2016-07-21: 10:00:00 via INTRAVENOUS

## 2016-07-21 MED ORDER — SODIUM CHLORIDE 0.9 % IV SOLN
INTRAVENOUS | Status: DC
  Administered 2016-07-21: 12:00:00 via INTRAVENOUS
  Filled 2016-07-21 (×2): qty 100

## 2016-07-21 NOTE — Patient Instructions (Addendum)
[name] Discharge Instructions for Patients Receiving Chemotherapy  Today you received the following chemotherapy agents Taxol/Carboplatin  To help prevent nausea and vomiting after your treatment, we encourage you to take your nausea medication    If you develop nausea and vomiting that is not controlled by your nausea medication, call the clinic.   BELOW ARE SYMPTOMS THAT SHOULD BE REPORTED IMMEDIATELY:  *FEVER GREATER THAN 100.5 F  *CHILLS WITH OR WITHOUT FEVER  NAUSEA AND VOMITING THAT IS NOT CONTROLLED WITH YOUR NAUSEA MEDICATION  *UNUSUAL SHORTNESS OF BREATH  *UNUSUAL BRUISING OR BLEEDING  TENDERNESS IN MOUTH AND THROAT WITH OR WITHOUT PRESENCE OF ULCERS  *URINARY PROBLEMS  *BOWEL PROBLEMS  UNUSUAL RASH Items with * indicate a potential emergency and should be followed up as soon as possible.  Feel free to call the clinic you have any questions or concerns. The clinic phone number is [phone].  Please show the [place] at check-in to the Emergency Department and triage nurse.   Hypokalemia Hypokalemia means that the amount of potassium in the blood is lower than normal.Potassium is a chemical, called an electrolyte, that helps regulate the amount of fluid in the body. It also stimulates muscle contraction and helps nerves function properly.Most of the body's potassium is inside of cells, and only a very small amount is in the blood. Because the amount in the blood is so small, minor changes can be life-threatening. CAUSES  Antibiotics.  Diarrhea or vomiting.  Using laxatives too much, which can cause diarrhea.  Chronic kidney disease.  Water pills (diuretics).  Eating disorders (bulimia).  Low magnesium level.  Sweating a lot. SIGNS AND SYMPTOMS  Weakness.  Constipation.  Fatigue.  Muscle cramps.  Mental confusion.  Skipped heartbeats or irregular heartbeat (palpitations).  Tingling or numbness. DIAGNOSIS   Your health care provider can diagnose hypokalemia with blood tests. In addition to checking your potassium level, your health care provider may also check other lab tests. TREATMENT Hypokalemia can be treated with potassium supplements taken by mouth or adjustments in your current medicines. If your potassium level is very low, you may need to get potassium through a vein (IV) and be monitored in the hospital. A diet high in potassium is also helpful. Foods high in potassium are:  Nuts, such as peanuts and pistachios.  Seeds, such as sunflower seeds and pumpkin seeds.  Peas, lentils, and lima beans.  Whole grain and bran cereals and breads.  Fresh fruit and vegetables, such as apricots, avocado, bananas, cantaloupe, kiwi, oranges, tomatoes, asparagus, and potatoes.  Orange and tomato juices.  Red meats.  Fruit yogurt. HOME CARE INSTRUCTIONS  Take all medicines as prescribed by your health care provider.  Maintain a healthy diet by including nutritious food, such as fruits, vegetables, nuts, whole grains, and lean meats.  If you are taking a laxative, be sure to follow the directions on the label. SEEK MEDICAL CARE IF:  Your weakness gets worse.  You feel your heart pounding or racing.  You are vomiting or having diarrhea.  You are diabetic and having trouble keeping your blood glucose in the normal range. SEEK IMMEDIATE MEDICAL CARE IF:  You have chest pain, shortness of breath, or dizziness.  You are vomiting or having diarrhea for more than 2 days.  You faint. MAKE SURE YOU:   Understand these instructions.  Will watch your condition.  Will get help right away if you are not doing well or get worse.  This information is not intended to replace advice given to you by your health care provider. Make sure you discuss any questions you have with your health care provider.   Document Released: 11/21/2005 Document Revised: 12/12/2014 Document Reviewed:  05/24/2013 Elsevier Interactive Patient Education [employer].

## 2016-07-21 NOTE — Progress Notes (Signed)
Potassium 3.1, Val RN aware whom will address with Dr. [name].  1025: Per Val RN per Dr [name] pt is to receive potassium IV while in clinic today, and a prescription  has been called into her pharmacy to begin taking at home. Pt aware and verbalizes understanding.  1348: Pt stable at time of discharge.

## 2016-07-21 NOTE — Progress Notes (Signed)
[place]  [phone]) [phone] Fax:(336) [phone]   ID: [name] DOB: 1976-06-18  MR#: [id number]  PZZ#:[id number]  Patient Care Team: Dorena Dew, FNP as PCP - General (Family Medicine) Alphonsa Overall, MD as Consulting Physician (General Surgery) Chauncey Cruel, MD as Consulting Physician (Oncology) Gery Pray, MD as Consulting Physician (Radiation Oncology) Sylvan Cheese, NP as Nurse Practitioner (Hematology and Oncology) Benson Norway, RN as Registered Nurse PCP: Dorena Dew, FNP GYN: OTHER MD:  CHIEF COMPLAINT: triple negative breast cancer  CURRENT TREATMENT: neoadjuvant chemotherapy  BREAST CANCER HISTORY: From the original intake note:  Maecie herself noted a change in her right breast sometime around September or October 2016. She did not bring it to intermediate medical attention, but on 01/13/2016 she established herself in Dr. Smith Robert' service and she was set up for bilateral diagnostic mammography with tomosynthesis and bilateral ultrasonography at the [facility] 01/19/2016. The breast density was category B. In the upper outer quadrant of the right breast there was a spiculated mass measuring 2.8 cm. On physical exam this was palpable. Targeted ultrasonography confirmed an irregular hypoechoic mass in the right breast 11:30 o'clock position measuring 2.6 cm maximally. Ultrasound of the right axilla showed a morphologically abnormal lymph node.  In the left breast there were some tubular densities behind the areola which by ultrasonography showed benign ductal ectasia.  On 01/28/2016 Sanya underwent biopsy of the right breast mass and abnormal right axillary lymph node. The pathology from this procedure (S AAA [id number]) showed the lymph node to be benign. In the breast however there was an invasive ductal carcinoma, grade 3, which was estrogen and progesterone receptor negative. The proliferation marker was 70%. HER-2 was not  amplified with a signals ratio of 1.32. The number per cell was 2.05.  The patient's subsequent history is as detailed below  INTERVAL HISTORY: Gilberte returns today for follow-up of her negative breast cancer. Today is day 1, cycle 7 of 12 planned cycles of paclitaxel and carboplatin given weekly.   REVIEW OF SYSTEMS: [name] walked her son to school, which she says is a 5 minute walk and was really fatigued after that. She is a little bit constipated. She has pain in her back and arms and she takes some Advil for this. She has a little bit of a runny nose. She feels anxious and depressed. She has not yet started taking the venlafaxine area she is having some hot flashes. She says sometimes her hands feel numb and sometimes her arms feel numb, but this lasts only a few minutes, it is very intermittent (doesn't happen every day plus (and she has not been able to pinpoint any particular activity or position that tends to trigger this problem. She says her inguinal rash is much better on denies oral cream. A detailed review of systems today was otherwise stable   PAST MEDICAL HISTORY: Past Medical History:  Diagnosis Date  . Anxiety   . Breast cancer ([place])   . Depression   . GERD (gastroesophageal reflux disease)   . Hypertension    pt states is currently on no medications   . Obesity (BMI 35.0-39.9 without comorbidity) ([place])   . Seasonal allergies   . Sickle cell trait ([place])   . Termination of pregnancy (fetus) 04/02/16    PAST SURGICAL HISTORY: Past Surgical History:  Procedure Laterality Date  . CESAREAN SECTION     2004 and 2007    FAMILY HISTORY Family History  Problem Relation Age  of Onset  . Hypertension Mother   . Uterine cancer Mother   . Cancer Father   . Hypertension Father   . Breast cancer Maternal Aunt   . Prostate cancer Maternal Uncle   . Breast cancer Maternal Grandmother   . Throat cancer Maternal Grandfather   . Breast cancer Cousin   The patient has very  little information about her father. Her mother is currently 68 years old. She had a history of cervical cancer at age 50. The patient had 2 brothers, no sisters. The maternal grandfather had throat cancer. A maternal uncle was diagnosed with breast cancer as well as prostate cancer at the age of 1. 2 maternal cousins, one of the mail, had breast cancer as well.  GYNECOLOGIC HISTORY:  No LMP recorded. Menarche age 46, first live birth age 44. The patient is GX P4. She still having regular periods. She took oral contraceptives in the 1990s with no side effects.  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 The patient's significant other [name] works at [employer]. In addition to them the patient's 3 children Chasmine [place]lton, Ronald and [name] are also in the home. There are age 34, 33, and 16 as of August 2017. The patient's son [name], currently 22 years old, lives in [place].    ADVANCED DIRECTIVES: Not in place   HEALTH MAINTENANCE: Social History  Substance Use Topics  . Smoking status: Current Every Day Smoker    Packs/day: 0.25    Years: 20.00    Types: Cigarettes  . Smokeless tobacco: Never Used  . Alcohol use Yes     Comment: occ     Colonoscopy:  PAP:  Bone density:  Lipid panel:  No Known Allergies  Current Outpatient Prescriptions  Medication Sig Dispense Refill  . ketoconazole (NIZORAL) 2 % cream Apply 1 application topically daily. 15 g 0  . LORazepam (ATIVAN) 0.5 MG tablet Take 1 tablet (0.5 mg total) by mouth at bedtime. 30 tablet 0  . omeprazole (PRILOSEC) 20 MG capsule Take 1 capsule (20 mg total) by mouth daily. 30 capsule 3  . venlafaxine XR (EFFEXOR-XR) 75 MG 24 hr capsule Take 1 capsule (75 mg total) by mouth daily with breakfast. 30 capsule 6   No current facility-administered medications for this visit.     OBJECTIVE: [name] in no  acute distress Vitals:   07/21/16 0853  BP: 125/88  Pulse: 96  Resp: 18  Temp: 98 F (36.7 C)     Body mass index is 33.03 kg/m.    ECOG FS:1 - Symptomatic but completely ambulatory  Sclerae unicteric, pupils round and equal Oropharynx clear and moist-- no thrush or other lesions No cervical or supraclavicular adenopathy Lungs no rales or rhonchi Heart regular rate and rhythm Abd soft, nontender, positive bowel sounds MSK no focal spinal tenderness, no upper extremity lymphedema Neuro: nonfocal, well oriented, appropriate affect Breasts: Deferred    LAB RESULTS:  CMP     Component Value Date/Time   NA 140 07/14/2016 0805   K 3.8 07/14/2016 0805   CL 106 04/11/2016 1045   CO2 23 07/14/2016 0805   GLUCOSE 90 07/14/2016 0805   BUN 7.7 07/14/2016 0805   CREATININE 0.8 07/14/2016 0805   CALCIUM 9.9 07/14/2016 0805   PROT 7.9 07/14/2016 0805   ALBUMIN 4.0 07/14/2016 0805   AST 16 07/14/2016 0805  ALT 14 07/14/2016 0805   ALKPHOS 63 07/14/2016 0805   BILITOT <0.30 07/14/2016 0805   GFRNONAA >60 04/11/2016 1045   GFRNONAA >89 01/12/2016 1115   GFRAA >60 04/11/2016 1045   GFRAA >89 01/12/2016 1115    INo results found for: SPEP, UPEP  Lab Results  Component Value Date   WBC 4.2 07/21/2016   NEUTROABS 3.0 07/21/2016   HGB 9.3 (L) 07/21/2016   HCT 27.8 (L) 07/21/2016   MCV 99.3 07/21/2016   PLT 137 (L) 07/21/2016      Chemistry      Component Value Date/Time   NA 140 07/14/2016 0805   K 3.8 07/14/2016 0805   CL 106 04/11/2016 1045   CO2 23 07/14/2016 0805   BUN 7.7 07/14/2016 0805   CREATININE 0.8 07/14/2016 0805      Component Value Date/Time   CALCIUM 9.9 07/14/2016 0805   ALKPHOS 63 07/14/2016 0805   AST 16 07/14/2016 0805   ALT 14 07/14/2016 0805   BILITOT <0.30 07/14/2016 0805       No results found for: LABCA2  No components found for: LABCA125  No results for input(s): INR in the last 168 hours.  Urinalysis    Component Value  Date/Time   LABSPEC 1.020 01/12/2016 1122   PHURINE 5.5 01/12/2016 1122   GLUCOSEU NEGATIVE 01/12/2016 1122   HGBUR TRACE (A) 01/12/2016 1122   BILIRUBINUR NEGATIVE 01/12/2016 1122   KETONESUR NEGATIVE 01/12/2016 1122   PROTEINUR NEGATIVE 01/12/2016 1122   UROBILINOGEN 0.2 01/12/2016 1122   NITRITE NEGATIVE 01/12/2016 1122   LEUKOCYTESUR NEGATIVE 01/12/2016 1122    ELIGIBLE FOR AVAILABLE RESEARCH PROTOCOL: ASA, B-WELL  STUDIES: No results found.  ASSESSMENT: 40 y.o. [place] woman status post right breast upper-outer quadrant biopsy 01/28/2016 for a clinical T2 N0 invasive ductal carcinoma, grade 3, triple negative, with an MIB-1 of 70%.  (a) suspicious right axillary lymph node biopsied 01/28/2016 was benign  (1) neoadjuvant chemotherapy: doxorubicin and cyclophosphamide in dose dense fashion 4 started 04/14/16, completed 05/26/2016, followed by paclitaxel and carboplatin weekly 12, Started 06/09/2016  (2) genetics testing is pending  (3) definitive surgery to follow  (5) adjuvant radiation to follow as appropriate  PLAN: Sherl continues to tolerate her neoadjuvant therapy well and specifically there is no evidence of peripheral neuropathy developing I am not sure why she occasionally has some known feelings but these are extremely brief and intermittent. They're not consistent with neuropathy due to chemotherapy.  We discussed how to manage her constipation.   Even though she felt very tired after walking 5 minutes I encouraged her to increase her activity level as tolerated. She does keep herself well-hydrated.  We are proceeding with a 7 of 12 planned cycles of chemotherapy today. If there are noted delays or interruptions she will finish September 27. I will set her up for repeat MRI early the following week with further follow-up with her surgeon after that.   Chauncey Cruel, MD   07/21/2016 8:56 AM

## 2016-07-25 ENCOUNTER — Encounter: Payer: Self-pay | Admitting: Genetic Counselor

## 2016-07-25 NOTE — Progress Notes (Signed)
[place]  Telephone:(336) [phone] Fax:(336) [phone]   ID: [name] DOB: [birth year]  MR#: [id number]  WNI#:[id number]  Patient Care Team: Dorena Dew, FNP as PCP - General (Family Medicine) Alphonsa Overall, MD as Consulting Physician (General Surgery) Chauncey Cruel, MD as Consulting Physician (Oncology) Gery Pray, MD as Consulting Physician (Radiation Oncology) Sylvan Cheese, NP as Nurse Practitioner (Hematology and Oncology) Benson Norway, RN as Registered Nurse PCP: Dorena Dew, FNP GYN: OTHER MD:  CHIEF COMPLAINT: triple negative breast cancer  CURRENT TREATMENT: neoadjuvant chemotherapy  BREAST CANCER HISTORY: From the original intake note:  Shahrzad herself noted a change in her right breast sometime around September or October 2016. She did not bring it to intermediate medical attention, but on 01/13/2016 she established herself in Dr. Smith Robert' service and she was set up for bilateral diagnostic mammography with tomosynthesis and bilateral ultrasonography at the [facility] 01/19/2016. The breast density was category B. In the upper outer quadrant of the right breast there was a spiculated mass measuring 2.8 cm. On physical exam this was palpable. Targeted ultrasonography confirmed an irregular hypoechoic mass in the right breast 11:30 o'clock position measuring 2.6 cm maximally. Ultrasound of the right axilla showed a morphologically abnormal lymph node.  In the left breast there were some tubular densities behind the areola which by ultrasonography showed benign ductal ectasia.  On 01/28/2016 Younique underwent biopsy of the right breast mass and abnormal right axillary lymph node. The pathology from this procedure (S AAA [id number]) showed the lymph node to be benign. In the breast however there was an invasive ductal carcinoma, grade 3, which was estrogen and progesterone receptor negative. The proliferation marker was 70%. HER-2 was not  amplified with a signals ratio of 1.32. The number per cell was 2.05.  The patient's subsequent history is as detailed below  INTERVAL HISTORY: Shaton returns today for follow-up of her breast cancer.  She was scheduled for a genetics mcounseling meeting but developed an anxiety attack. I saw her briefly in the lobby andI worked her in to see me.   REVIEW OF SYSTEMS: [name] received lorazepam 0.5 mg and by the time I saw her in the exam room she was much calmer. She says she has been under a lot of stress because her mother went into the hospital yesterday with a clot in her lung. She is also anxious about the children and money issues she feels weak. She continues to hurt "all over". She has a runny nose and sinus symptoms and feels short of breath with moderate activity. Her appetite is poor. She has heartburn issues. She feels anxious and depressed, but not suicidal. Importantly, she is beginning to develop peripheral neuropathy symptoms. She complains of tingling in her fingers and toes. He detailed review of systems today was otherwise stable  PAST MEDICAL HISTORY: Past Medical History:  Diagnosis Date  . Anxiety   . Breast cancer ([place])   . Depression   . GERD (gastroesophageal reflux disease)   . Hypertension    pt states is currently on no medications   . Obesity (BMI 35.0-39.9 without comorbidity) ([place])   . Seasonal allergies   . Sickle cell trait ([place])   . Termination of pregnancy (fetus) 04/02/16    PAST SURGICAL HISTORY: Past Surgical History:  Procedure Laterality Date  . CESAREAN SECTION     2004 and 2007    FAMILY HISTORY Family History  Problem Relation Age of Onset  . Hypertension Mother   .  Uterine cancer Mother   . Breast cancer Cousin   . Cancer Father   . Hypertension Father   . Breast cancer Maternal Aunt   . Prostate cancer Maternal Uncle   . Breast cancer Maternal Grandmother   . Throat cancer Maternal Grandfather   The patient has very little  information about her father. Her mother is currently 34 years old. She had a history of cervical cancer at age 34. The patient had 2 brothers, no sisters. The maternal grandfather had throat cancer. A maternal uncle was diagnosed with breast cancer as well as prostate cancer at the age of 57. 2 maternal cousins, one of the mail, had breast cancer as well.  GYNECOLOGIC HISTORY:  No LMP recorded. Menarche age 56, first live birth age 31. The patient is GX P4. She still having regular periods. She took oral contraceptives in the 1990s with no side effects.  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 The patient's significant other [name] works at [employer]. In addition to them the patient's 3 children Chasmine [place], [place] and [name] are also in the home. There are age 12, 27, and 17 as of August 2017. The patient's son [name], currently 37 years old, lives in [place].    ADVANCED DIRECTIVES: Not in place   HEALTH MAINTENANCE: Social History  Substance Use Topics  . Smoking status: Current Every Day Smoker    Packs/day: 0.25    Years: 20.00    Types: Cigarettes  . Smokeless tobacco: Never Used  . Alcohol use Yes     Comment: occ     Colonoscopy:  PAP:  Bone density:  Lipid panel:  No Known Allergies  Current Outpatient Prescriptions  Medication Sig Dispense Refill  . ketoconazole (NIZORAL) 2 % cream Apply 1 application topically daily. 15 g 0  . LORazepam (ATIVAN) 0.5 MG tablet Take 1 tablet (0.5 mg total) by mouth at bedtime. 30 tablet 0  . omeprazole (PRILOSEC) 20 MG capsule Take 1 capsule (20 mg total) by mouth daily. 30 capsule 3  . potassium chloride (MICRO-K) 10 MEQ CR capsule Take 1 capsule (10 mEq total) by mouth daily. 30 capsule 3  . venlafaxine XR (EFFEXOR-XR) 75 MG 24 hr capsule Take 1 capsule (75 mg total) by mouth daily with breakfast. 30 capsule 6   No current  facility-administered medications for this visit.    OBJECTIVE: [name] who appears stated age BP: 113/73  Pulse: 79  Resp: 18  Temp: 98.1 F (36.7 C)     Body mass index is 33.29 kg/m.    ECOG FS:1 - Symptomatic but completely ambulatory  Sclerae unicteric, EOMs intact Oropharynx clear and moist No cervical or supraclavicular adenopathy Lungs no rales or rhonchi Heart regular rate and rhythm Abd soft, nontender, positive bowel sounds MSK no focal spinal tenderness, no upper extremity lymphedema Neuro: nonfocal, well oriented, appropriate affect Breasts: Deferred    LAB RESULTS:  CMP     Component Value Date/Time   NA 142 07/21/2016 0826   K 3.1 (L) 07/21/2016 0826   CL 106 04/11/2016 1045   CO2 23 07/21/2016 0826   GLUCOSE 124 07/21/2016 0826   BUN 8.8 07/21/2016 0826   CREATININE 0.8 07/21/2016 0826   CALCIUM 9.5 07/21/2016 0826   PROT 7.5 07/21/2016 0826   ALBUMIN 3.9 07/21/2016 0826   AST 17 07/21/2016 0826  ALT 18 07/21/2016 0826   ALKPHOS 57 07/21/2016 0826   BILITOT 0.35 07/21/2016 0826   GFRNONAA >60 04/11/2016 1045   GFRNONAA >89 01/12/2016 1115   GFRAA >60 04/11/2016 1045   GFRAA >89 01/12/2016 1115    INo results found for: SPEP, UPEP  Lab Results  Component Value Date   WBC 4.2 07/21/2016   NEUTROABS 3.0 07/21/2016   HGB 9.3 (L) 07/21/2016   HCT 27.8 (L) 07/21/2016   MCV 99.3 07/21/2016   PLT 137 (L) 07/21/2016      Chemistry      Component Value Date/Time   NA 142 07/21/2016 0826   K 3.1 (L) 07/21/2016 0826   CL 106 04/11/2016 1045   CO2 23 07/21/2016 0826   BUN 8.8 07/21/2016 0826   CREATININE 0.8 07/21/2016 0826      Component Value Date/Time   CALCIUM 9.5 07/21/2016 0826   ALKPHOS 57 07/21/2016 0826   AST 17 07/21/2016 0826   ALT 18 07/21/2016 0826   BILITOT 0.35 07/21/2016 0826       No results found for: LABCA2  No components found for: LABCA125  No results for input(s): INR in the last 168  hours.  Urinalysis    Component Value Date/Time   LABSPEC 1.020 01/12/2016 1122   PHURINE 5.5 01/12/2016 1122   GLUCOSEU NEGATIVE 01/12/2016 1122   HGBUR TRACE (A) 01/12/2016 1122   BILIRUBINUR NEGATIVE 01/12/2016 1122   KETONESUR NEGATIVE 01/12/2016 1122   PROTEINUR NEGATIVE 01/12/2016 1122   UROBILINOGEN 0.2 01/12/2016 1122   NITRITE NEGATIVE 01/12/2016 1122   LEUKOCYTESUR NEGATIVE 01/12/2016 1122    ELIGIBLE FOR AVAILABLE RESEARCH PROTOCOL: ASA, B-WELL  STUDIES: No results found.  ASSESSMENT: 40 y.o. [place] woman status post right breast upper-outer quadrant biopsy 01/28/2016 for a clinical T2 N0 invasive ductal carcinoma, grade 3, triple negative, with an MIB-1 of 70%.  (a) suspicious right axillary lymph node biopsied 01/28/2016 was benign  (1) neoadjuvant chemotherapy: doxorubicin and cyclophosphamide in dose dense fashion 4 started 04/14/16, completed 05/26/2016, followed by paclitaxel and carboplatin weekly 12, Started 06/09/2016  (2) genetics testing is pending  (3) definitive surgery to follow  (5) adjuvant radiation to follow as appropriate  PLAN: Malaney is beginning to develop peripheral neuropathy symptoms and I think we are going to have to stop her weekly paclitaxel. I have canceled her treatment later this week, and I have put in an order for repeat breast MRI to see how far we have been able to get with her neoadjuvant treatment. I also sent a note to her surgeon so he can evaluate her for surgery.  She will proceed to genetics counseling today as scheduled. She will have a repeat echocardiogram before the end of the month. She will return to see me at the end of this month and at that time we will review the results of the breast MRI and the surgical plans.  She has a good understanding of this plan. She knows to call for any problems that may develop before that visit.   Chauncey Cruel, MD   07/26/2016 6:22 PM

## 2016-07-26 ENCOUNTER — Ambulatory Visit (HOSPITAL_BASED_OUTPATIENT_CLINIC_OR_DEPARTMENT_OTHER): Payer: Medicaid Other | Admitting: Oncology

## 2016-07-26 ENCOUNTER — Encounter: Payer: Self-pay | Admitting: Nurse Practitioner

## 2016-07-26 ENCOUNTER — Ambulatory Visit: Payer: Medicaid Other | Admitting: Nurse Practitioner

## 2016-07-26 ENCOUNTER — Encounter: Payer: Medicaid Other | Admitting: Genetic Counselor

## 2016-07-26 ENCOUNTER — Other Ambulatory Visit: Payer: Medicaid Other

## 2016-07-26 VITALS — BP 113/73 | HR 79 | Temp 98.1°F | Resp 18 | Ht 61.5 in | Wt 179.1 lb

## 2016-07-26 VITALS — BP 108/59 | HR 83 | Temp 97.9°F | Resp 20

## 2016-07-26 DIAGNOSIS — C50411 Malignant neoplasm of upper-outer quadrant of right female breast: Secondary | ICD-10-CM

## 2016-07-26 DIAGNOSIS — F410 Panic disorder [episodic paroxysmal anxiety] without agoraphobia: Secondary | ICD-10-CM

## 2016-07-26 DIAGNOSIS — R202 Paresthesia of skin: Secondary | ICD-10-CM | POA: Diagnosis not present

## 2016-07-26 DIAGNOSIS — Z171 Estrogen receptor negative status [ER-]: Secondary | ICD-10-CM

## 2016-07-26 DIAGNOSIS — Z720 Tobacco use: Secondary | ICD-10-CM | POA: Diagnosis not present

## 2016-07-26 NOTE — Progress Notes (Signed)
Pt brought to [facility] from front lobby d/t pt stating she was feeling very anxious, short of breath. Pt has appts this am for labs , Dr. [name] and genetic counseling. VSS. Pt states she gets panic attacks at times and that is what happened this am. She has Ativan with her and she took one prior to coming into [facility] clinic. Shpresa is now feeling better. Calmer though somewhat tearful. She states she has a lot of responsibilities right now with children, her mother is in the hospital and dealing with her breast cancer.  Active listening provided, emotional support provided.  Pt feels that she can continue with her appts today.  Escorted pt to Registration and facilitated her getting registered. Spoke with Val, RN with Dr. [name] and informed her of the morning events.  No other interventions needed. Pt quite calm and is feeling better at this time.

## 2016-07-27 ENCOUNTER — Other Ambulatory Visit: Payer: Self-pay | Admitting: *Deleted

## 2016-07-27 ENCOUNTER — Telehealth: Payer: Self-pay | Admitting: *Deleted

## 2016-07-27 ENCOUNTER — Other Ambulatory Visit: Payer: Self-pay | Admitting: Pharmacist

## 2016-07-27 DIAGNOSIS — C50411 Malignant neoplasm of upper-outer quadrant of right female breast: Secondary | ICD-10-CM

## 2016-07-27 NOTE — Telephone Encounter (Signed)
"  Beth with [facility] calling to ask Dr. [name] to re-enter MRI order.  It needs to be ordered with and without contrast.  Test has not been scheduled yet."  Will notify provider.

## 2016-07-28 ENCOUNTER — Ambulatory Visit: Payer: Medicaid Other

## 2016-07-28 ENCOUNTER — Other Ambulatory Visit: Payer: Medicaid Other

## 2016-07-28 ENCOUNTER — Ambulatory Visit: Payer: Medicaid Other | Admitting: Oncology

## 2016-08-03 ENCOUNTER — Other Ambulatory Visit: Payer: Self-pay

## 2016-08-03 ENCOUNTER — Encounter (HOSPITAL_COMMUNITY): Payer: Self-pay

## 2016-08-03 ENCOUNTER — Ambulatory Visit (HOSPITAL_COMMUNITY)
Admission: RE | Admit: 2016-08-03 | Discharge: 2016-08-03 | Disposition: A | Discharge status: Home / Self Care | Payer: Medicaid Other | Source: Ambulatory Visit | Attending: Cardiology | Admitting: Cardiology

## 2016-08-03 ENCOUNTER — Ambulatory Visit (HOSPITAL_BASED_OUTPATIENT_CLINIC_OR_DEPARTMENT_OTHER)
Admission: RE | Admit: 2016-08-03 | Discharge: 2016-08-03 | Disposition: A | Discharge status: Home / Self Care | Payer: Medicaid Other | Source: Ambulatory Visit | Attending: Family Medicine | Admitting: Family Medicine

## 2016-08-03 VITALS — BP 116/76 | HR 105 | Wt 182.2 lb

## 2016-08-03 DIAGNOSIS — C50919 Malignant neoplasm of unspecified site of unspecified female breast: Secondary | ICD-10-CM | POA: Diagnosis present

## 2016-08-03 DIAGNOSIS — Z803 Family history of malignant neoplasm of breast: Secondary | ICD-10-CM | POA: Insufficient documentation

## 2016-08-03 DIAGNOSIS — F17210 Nicotine dependence, cigarettes, uncomplicated: Secondary | ICD-10-CM | POA: Diagnosis not present

## 2016-08-03 DIAGNOSIS — Z79899 Other long term (current) drug therapy: Secondary | ICD-10-CM | POA: Insufficient documentation

## 2016-08-03 DIAGNOSIS — Z8249 Family history of ischemic heart disease and other diseases of the circulatory system: Secondary | ICD-10-CM | POA: Insufficient documentation

## 2016-08-03 DIAGNOSIS — I427 Cardiomyopathy due to drug and external agent: Secondary | ICD-10-CM | POA: Diagnosis not present

## 2016-08-03 DIAGNOSIS — C50411 Malignant neoplasm of upper-outer quadrant of right female breast: Secondary | ICD-10-CM

## 2016-08-03 DIAGNOSIS — T451X5A Adverse effect of antineoplastic and immunosuppressive drugs, initial encounter: Secondary | ICD-10-CM | POA: Diagnosis not present

## 2016-08-03 DIAGNOSIS — D573 Sickle-cell trait: Secondary | ICD-10-CM | POA: Diagnosis not present

## 2016-08-03 DIAGNOSIS — Z171 Estrogen receptor negative status [ER-]: Secondary | ICD-10-CM | POA: Insufficient documentation

## 2016-08-03 MED ORDER — CARVEDILOL 3.125 MG PO TABS: 3.1250 mg | ORAL_TABLET | Freq: Two times a day (BID) | ORAL | 6 refills | Status: DC

## 2016-08-03 MED ORDER — LOSARTAN POTASSIUM 25 MG PO TABS: 12.5000 mg | ORAL_TABLET | Freq: Every day | ORAL | 3 refills | Status: DC

## 2016-08-03 NOTE — Progress Notes (Signed)
*  PRELIMINARY RESULTS* Echocardiogram 2D Echocardiogram has been performed.  Leavy Cella 08/03/2016, 11:07 AM

## 2016-08-03 NOTE — Patient Instructions (Signed)
START Losartan 12.5 mg, one half tab daily in the evening START Coreg 3.125mg , one tab twice a day  Your physician has requested that you have an echocardiogram. Echocardiography is a painless test that uses sound waves to create images of your heart. It provides your doctor with information about the size and shape of your heart and how well your heart's chambers and valves are working. This procedure takes approximately one hour. There are no restrictions for this procedure.  Your physician recommends that you schedule a follow-up appointment in: 3 months with Dr Aundra Dubin

## 2016-08-04 ENCOUNTER — Ambulatory Visit (HOSPITAL_BASED_OUTPATIENT_CLINIC_OR_DEPARTMENT_OTHER): Payer: Medicaid Other | Admitting: Oncology

## 2016-08-04 ENCOUNTER — Ambulatory Visit: Payer: Medicaid Other

## 2016-08-04 ENCOUNTER — Other Ambulatory Visit (HOSPITAL_BASED_OUTPATIENT_CLINIC_OR_DEPARTMENT_OTHER): Payer: Medicaid Other

## 2016-08-04 VITALS — BP 106/77 | HR 115 | Temp 98.7°F | Resp 18 | Ht 61.5 in | Wt 183.6 lb

## 2016-08-04 DIAGNOSIS — Z171 Estrogen receptor negative status [ER-]: Secondary | ICD-10-CM | POA: Diagnosis not present

## 2016-08-04 DIAGNOSIS — C50411 Malignant neoplasm of upper-outer quadrant of right female breast: Secondary | ICD-10-CM | POA: Diagnosis present

## 2016-08-04 DIAGNOSIS — G620 Drug-induced polyneuropathy: Secondary | ICD-10-CM | POA: Diagnosis not present

## 2016-08-04 DIAGNOSIS — R05 Cough: Secondary | ICD-10-CM

## 2016-08-04 LAB — COMPREHENSIVE METABOLIC PANEL
ALT: 12 U/L (ref 0–55)
ANION GAP: 10 meq/L (ref 3–11)
AST: 16 U/L (ref 5–34)
Albumin: 3.7 g/dL (ref 3.5–5.0)
Alkaline Phosphatase: 58 U/L (ref 40–150)
BUN: 6.9 mg/dL — ABNORMAL LOW (ref 7.0–26.0)
CALCIUM: 9.4 mg/dL (ref 8.4–10.4)
CHLORIDE: 108 meq/L (ref 98–109)
CO2: 27 mEq/L (ref 22–29)
Creatinine: 0.8 mg/dL (ref 0.6–1.1)
Glucose: 90 mg/dl (ref 70–140)
POTASSIUM: 3.4 meq/L — AB (ref 3.5–5.1)
Sodium: 144 mEq/L (ref 136–145)
Total Bilirubin: 0.31 mg/dL (ref 0.20–1.20)
Total Protein: 7.5 g/dL (ref 6.4–8.3)

## 2016-08-04 LAB — CBC WITH DIFFERENTIAL/PLATELET
BASO%: 0.5 % (ref 0.0–2.0)
BASOS ABS: 0 10*3/uL (ref 0.0–0.1)
EOS%: 0.8 % (ref 0.0–7.0)
Eosinophils Absolute: 0 10*3/uL (ref 0.0–0.5)
HEMATOCRIT: 27.2 % — AB (ref 34.8–46.6)
HGB: 9 g/dL — ABNORMAL LOW (ref 11.6–15.9)
LYMPH#: 0.8 10*3/uL — AB (ref 0.9–3.3)
LYMPH%: 17.9 % (ref 14.0–49.7)
MCH: 33.5 pg (ref 25.1–34.0)
MCHC: 33.2 g/dL (ref 31.5–36.0)
MCV: 101 fL (ref 79.5–101.0)
MONO#: 0.7 10*3/uL (ref 0.1–0.9)
MONO%: 15.8 % — ABNORMAL HIGH (ref 0.0–14.0)
NEUT#: 2.9 10*3/uL (ref 1.5–6.5)
NEUT%: 65 % (ref 38.4–76.8)
PLATELETS: 121 10*3/uL — AB (ref 145–400)
RBC: 2.69 10*6/uL — ABNORMAL LOW (ref 3.70–5.45)
RDW: 17.9 % — ABNORMAL HIGH (ref 11.2–14.5)
WBC: 4.5 10*3/uL (ref 3.9–10.3)

## 2016-08-04 NOTE — Progress Notes (Signed)
[place]  [phone] [phone]   ID: [name] DOB: 06/21/1976  MR#: [id number]  MPN#:[id number]  Patient Care Team: Rebecca Dew, FNP as PCP - General (Family Medicine) Rebecca Overall, MD as Consulting Physician (General Surgery) Rebecca Cruel, MD as Consulting Physician (Oncology) Rebecca Pray, MD as Consulting Physician (Radiation Oncology) Rebecca Cheese, NP as Rebecca Practitioner (Hematology and Oncology) Rebecca Norway, RN as Registered Rebecca PCP: Rebecca Dew, FNP GYN: OTHER MD:  CHIEF COMPLAINT: triple negative breast cancer  [name] TREATMENT: Awaiting definitive surgery.  BREAST CANCER HISTORY: From the original intake note:  Rebecca [name] herself noted a change in her right breast sometime around September or October 2016. She did not bring it to intermediate medical attention, but on 01/13/2016 she established herself in Dr. Smith Robert' service and she was set up for bilateral diagnostic mammography with tomosynthesis and bilateral ultrasonography at the [facility] 01/19/2016. The breast density was category B. In the upper outer quadrant of the right breast there was a spiculated mass measuring 2.8 cm. On physical exam this was palpable. Targeted ultrasonography confirmed an irregular hypoechoic mass in the right breast 11:30 o'clock position measuring 2.6 cm maximally. Ultrasound of the right axilla showed a morphologically abnormal lymph node.  In the left breast there were some tubular densities behind the areola which by ultrasonography showed benign ductal ectasia.  On 01/28/2016 Rebecca [name] underwent biopsy of the right breast mass and abnormal right axillary lymph node. The pathology from this procedure ([facility] [id number]) showed the lymph node to be benign. In the breast however there was an invasive ductal carcinoma, grade 3, which was estrogen and progesterone receptor negative. The proliferation marker was 70%. HER-2 was not  amplified with a signals ratio of 1.32. The number per cell was 2.05.  The patient's subsequent history is as detailed below  INTERVAL HISTORY: Rebecca [name] returns today for follow-up of her triple negative breast cancer accompanied by her mother. We have been holding her chemotherapy because of some peripheral neuropathy and unfortunately this persists. Accordingly we are going to stop her chemotherapy at this point.  Since her last visit here she was evaluated by cardiology. Her ejection fraction was a little bit lower and there was some evidence of strain so she was started on Coreg and losartan. She has not been able to pick up the losartan yet because of insurance concerns.  REVIEW OF SYSTEMS: Rebecca [name] has some numbness involving the pads of her fingers in both hands. There is minimal numbness in her feet. This is not progressive but also it is not fading. She also has an upper respiratory infection or perhaps some allergies. She has had a cough, produces some clear phlegm, but has no pleurisy, no purulent sputum, no shortness of breath, and no fever. She continues to have pain "all over", including many joints and her back, to feel weak and to feel anxious and depressed. A detailed review of systems today was otherwise stable  PAST MEDICAL HISTORY: Past Medical History:  Diagnosis Date  . Anxiety   . Breast cancer ([place])   . Depression   . GERD (gastroesophageal reflux disease)   . Hypertension    pt states is currently on no medications   . Obesity (BMI 35.0-39.9 without comorbidity) ([place])   . Seasonal allergies   . Sickle cell trait ([place])   . Termination of pregnancy (fetus) 04/02/16    PAST SURGICAL HISTORY: Past Surgical History:  Procedure Laterality Date  . CESAREAN  SECTION     2004 and 2007    FAMILY HISTORY Family History  Problem Relation Age of Onset  . Hypertension Mother   . Uterine cancer Mother   . Breast cancer Cousin   . Cancer Father   . Hypertension Father   .  Breast cancer Maternal Aunt   . Prostate cancer Maternal Uncle   . Breast cancer Maternal Grandmother   . Throat cancer Maternal Grandfather   The patient has very little information about her father. Her mother is currently 64 years old. She had a history of cervical cancer at age 38. The patient had 2 brothers, no sisters. The maternal grandfather had throat cancer. A maternal uncle was diagnosed with breast cancer as well as prostate cancer at the age of 64. 2 maternal cousins, one of the mail, had breast cancer as well.  GYNECOLOGIC HISTORY:  No LMP recorded. Menarche age 46, first live birth age 55. The patient is GX P4. She still having regular periods. She took oral contraceptives in the 1990s with no side effects.  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 The patient's significant other Rebecca [name] works at [employer]. In addition to them the patient's 3 children Rebecca [name], Rebecca [name] and Rebecca [name] are also in the home. There are age 68, 85, and 32 as of August 2017. The patient's son Rebecca [name], currently 91 years old, lives in [place].    ADVANCED DIRECTIVES: Not in place   HEALTH MAINTENANCE: Social History  Substance Use Topics  . Smoking status: [name] Every Day Smoker    Packs/day: 0.25    Years: 20.00    Types: Cigarettes  . Smokeless tobacco: Never Used  . Alcohol use Yes     Comment: occ     Colonoscopy:  PAP:  Bone density:  Lipid panel:  No Known Allergies  [name] Outpatient Prescriptions  Medication Sig Dispense Refill  . ketoconazole (NIZORAL) 2 % cream Apply 1 application topically daily. 15 g 0  . LORazepam (ATIVAN) 0.5 MG tablet Take 1 tablet (0.5 mg total) by mouth at bedtime. 30 tablet 0  . omeprazole (PRILOSEC) 20 MG capsule Take 1 capsule (20 mg total) by mouth daily. 30 capsule 3  . potassium chloride (MICRO-K) 10 MEQ CR capsule Take 1 capsule (10 mEq  total) by mouth daily. 30 capsule 3  . venlafaxine XR (EFFEXOR-XR) 75 MG 24 hr capsule Take 1 capsule (75 mg total) by mouth daily with breakfast. 30 capsule 6   No [name] facility-administered medications for this visit.    OBJECTIVE: Rebecca [name] In no acute distress BP: 113/73  Pulse: 79  Resp: 18  Temp: 98.1 F (36.7 C)     Body mass index is 33.29 kg/m.    ECOG FS:1 - Symptomatic but completely ambulatory  Sclerae unicteric, pupils round and equal Oropharynx clear and moist-- no thrush or other lesions No cervical or supraclavicular adenopathy Lungs no rales or rhonchi Heart regular rate and rhythm Abd soft, nontender, positive bowel sounds MSK no focal spinal tenderness, no upper extremity lymphedema Neuro: nonfocal, well oriented, appropriate affect Breasts: I do not palpate a mass in the right breast, there are no skin or nipple changes of concern, in the right axilla is benign. Left breast is unremarkable.      LAB RESULTS:  CMP     Component Value Date/Time   NA 142  07/21/2016 0826   K 3.1 (L) 07/21/2016 0826   CL 106 04/11/2016 1045   CO2 23 07/21/2016 0826   GLUCOSE 124 07/21/2016 0826   BUN 8.8 07/21/2016 0826   CREATININE 0.8 07/21/2016 0826   CALCIUM 9.5 07/21/2016 0826   PROT 7.5 07/21/2016 0826   ALBUMIN 3.9 07/21/2016 0826   AST 17 07/21/2016 0826   ALT 18 07/21/2016 0826   ALKPHOS 57 07/21/2016 0826   BILITOT 0.35 07/21/2016 0826   GFRNONAA >60 04/11/2016 1045   GFRNONAA >89 01/12/2016 1115   GFRAA >60 04/11/2016 1045   GFRAA >89 01/12/2016 1115    INo results found for: SPEP, UPEP  Lab Results  Component Value Date   WBC 4.2 07/21/2016   NEUTROABS 3.0 07/21/2016   HGB 9.3 (L) 07/21/2016   HCT 27.8 (L) 07/21/2016   MCV 99.3 07/21/2016   PLT 137 (L) 07/21/2016      Chemistry      Component Value Date/Time   NA 142 07/21/2016 0826   K 3.1 (L) 07/21/2016 0826   CL 106 04/11/2016 1045   CO2 23 07/21/2016 0826    BUN 8.8 07/21/2016 0826   CREATININE 0.8 07/21/2016 0826      Component Value Date/Time   CALCIUM 9.5 07/21/2016 0826   ALKPHOS 57 07/21/2016 0826   AST 17 07/21/2016 0826   ALT 18 07/21/2016 0826   BILITOT 0.35 07/21/2016 0826       No results found for: LABCA2  No components found for: LABCA125  No results for input(s): INR in the last 168 hours.  Urinalysis    Component Value Date/Time   LABSPEC 1.020 01/12/2016 1122   PHURINE 5.5 01/12/2016 1122   GLUCOSEU NEGATIVE 01/12/2016 1122   HGBUR TRACE (A) 01/12/2016 1122   BILIRUBINUR NEGATIVE 01/12/2016 1122   KETONESUR NEGATIVE 01/12/2016 1122   PROTEINUR NEGATIVE 01/12/2016 1122   UROBILINOGEN 0.2 01/12/2016 1122   NITRITE NEGATIVE 01/12/2016 1122   LEUKOCYTESUR NEGATIVE 01/12/2016 1122    ELIGIBLE FOR AVAILABLE RESEARCH PROTOCOL: ASA, B-WELL  STUDIES: ------------------------------------------------------------------- Transthoracic Echocardiography  Patient:    Rebecca [name], Rebecca [name] MR #:       [id number] Study Date: 08/03/2016 Gender:     F Age:        40 Height:     156.2 cm Weight:     81.2 kg BSA:        1.91 m^2 Pt. Status: Room:   ATTENDING    Loralie Champagne, M.D.  ORDERING     Loralie Champagne, M.D.  SONOGRAPHER  [facility]  PERFORMING   [facility] Outpatient  REFERRING    Cammie Sickle M  cc:  ------------------------------------------------------------------- LV EF: 50%  CLINICAL DATA:  40 year old female with biopsy proven invasive ductal carcinoma in the right breast. Patient's MRI was performed on 04/26/2016. Multiple attempts were made to contact the patient for repeat imaging secondary to motion. The patient never returned phone calls for repeat imaging. Rebecca [name], Rebecca [name] was contacted about the repeat imaging, she states that the patient is having another MRI in 3 weeks following neoadjuvant chemotherapy. The patient will not return for  repeat imaging until that time.  LABS:  None obtained at the time of imaging.  EXAM: BILATERAL BREAST MRI WITH AND WITHOUT CONTRAST  TECHNIQUE: Multiplanar, multisequence MR images of both breasts were obtained prior to and following the intravenous administration of 16 ml of MultiHance.  THREE-DIMENSIONAL MR IMAGE RENDERING ON INDEPENDENT WORKSTATION:  Three-dimensional MR  images were rendered by post-processing of the original MR data on an independent workstation. The three-dimensional MR images were interpreted, and findings are reported in the following complete MRI report for this study. Three dimensional images were evaluated at the independent DynaCad workstation  COMPARISON:  Previous exam(s).  FINDINGS: The exam is sub optimal secondary to motion. Motion is more pronounced with the left breast.  Breast composition: b. Scattered fibroglandular tissue.  Background parenchymal enhancement: Moderate.  Right breast: There is a 3.0 x 2.7 x 3.0 cm irregular enhancing mass in the central right breast. There is a signal void artifact in the mass corresponding with the biopsy clip.  Left breast: Imaging of the left breast is sub optimal secondary to motion. There is a left-sided Port-A-Cath. No suspicious mass or abnormal enhancement seen on the sub optimal images.  Lymph nodes: Two level I right axillary lymph nodes measuring 1.1 and 1.3 cm are seen. The cortices seen mildly prominent. A right axillary lymph node was biopsied on 01/28/2016 and was benign.  Ancillary findings:  None.  IMPRESSION: 3.0 cm mass in the central right breast corresponding with the biopsy proven invasive ductal carcinoma. Sub optimal imaging of the left breast secondary to motion. The patient will have a repeat MRI following neoadjuvant chemotherapy.  RECOMMENDATION: Treatment planning of the known right breast invasive ductal carcinoma is recommended.  BI-RADS  CATEGORY  6: Known biopsy-proven malignancy.   Electronically Signed   By: Lillia Mountain M.D.   On: 05/09/2016 13:08    ASSESSMENT: 40 y.o. Rebecca [name] status post right breast upper-outer quadrant biopsy 01/28/2016 for a clinical T2 N0 invasive ductal carcinoma, grade 3, triple negative, with an MIB-1 of 70%.  (a) suspicious right axillary lymph node biopsied 01/28/2016 was benign  (1) neoadjuvant chemotherapy: doxorubicin and cyclophosphamide in dose dense fashion 4 started 04/14/16, completed 05/26/2016, followed by paclitaxel and carboplatin weekly 12, Started 06/09/2016  (a) taxol discontinued after 7 doses because of neuropathy, last dose 07/21/2016  (2) genetics testing is pending  (3) definitive surgery to follow  (5) adjuvant radiation to follow as appropriate  PLAN: Rebecca [name] continues to have peripheral neuropathy symptoms, which are no worse but also have not resolved. Accordingly we are discontinuing Taxol now.  She is scheduled for repeat MRI next week. I have alerted her surgeon that she is now ready to proceed to definitive surgery. Her port may be removed at the same time.  She is having some upper respiratory symptoms, but no fever or purulence. As she is going to try loratadine for that.  She was prescribed Coreg and losartan by Dr. Aundra Dubin, but the losartan had not been approved when she went to begin up. It should be available now. She will let me know if there is any problem getting that date 4 through Medicaid.  Otherwise she will see me again next week just to discuss the MRI results. I have canceled her chemotherapy treatments  She knows to call for any other problems that may develop before her next visit here.   Rebecca Cruel, MD 08/04/2016

## 2016-08-04 NOTE — Progress Notes (Signed)
Patient ID: Rebecca [name], female   DOB: 1976/05/28, 40 y.o.   MRN: [id number] Oncology: Dr. [name]  40 yo with recent breast cancer diagnosis presents for cardio-oncology evaluation.  Her tumor is ER-/PR-/HER2-.  She completed doxorubicin/Cytoxan chemotherapy then started paclitaxel + carboplatin. This will be followed by surgery probably in 10/17.    No history of heart problems.  No exertional dyspnea or chest pain.  No palpitations.  She feels "weak" and fatigues easily.    Labs (8/17): creatinine 0.8  PMH: 1. Breast cancer: ER-/PR-/HER2-.  She will be starting doxorubicin/Cytoxan chemotherapy then paclitaxel + carboplatin.  This will be followed by surgery.   - Echo (3/17) with EF 55%, GLS -20.8%, lateral s' 10.3 cm/sec, normal RV size and systolic function.  - Echo (8/17): EF 50%, grade II diastolic dysfunction, lateral s' 10 cm/sec, GLS -16.9%.    Social History   Social History  . Marital status: Single    Spouse name: N/A  . Number of children: N/A  . Years of education: N/A   Occupational History  . Not on file.   Social History Main Topics  . Smoking status: Current Every Day Smoker    Packs/day: 0.25    Years: 20.00    Types: Cigarettes  . Smokeless tobacco: Never Used  . Alcohol use Yes     Comment: occ  . Drug use: No  . Sexual activity: Not on file   Other Topics Concern  . Not on file   Social History Narrative  . No narrative on file   Family History  Problem Relation Age of Onset  . Hypertension Mother   . Uterine cancer Mother   . Breast cancer Cousin   . Cancer Father   . Hypertension Father   . Breast cancer Maternal Aunt   . Prostate cancer Maternal Uncle   . Breast cancer Maternal Grandmother   . Throat cancer Maternal Grandfather    ROS: All systems reviewed and negative except as per HPI  Current Outpatient Prescriptions  Medication Sig Dispense Refill  . ketoconazole (NIZORAL) 2 % cream Apply 1 application topically daily. 15 g 0  .  LORazepam (ATIVAN) 0.5 MG tablet Take 1 tablet (0.5 mg total) by mouth at bedtime. 30 tablet 0  . omeprazole (PRILOSEC) 20 MG capsule Take 1 capsule (20 mg total) by mouth daily. 30 capsule 3  . potassium chloride (MICRO-K) 10 MEQ CR capsule Take 1 capsule (10 mEq total) by mouth daily. 30 capsule 3  . venlafaxine XR (EFFEXOR-XR) 75 MG 24 hr capsule Take 1 capsule (75 mg total) by mouth daily with breakfast. 30 capsule 6  . carvedilol (COREG) 3.125 MG tablet Take 1 tablet (3.125 mg total) by mouth 2 (two) times daily with a meal. 60 tablet 6  . losartan (COZAAR) 25 MG tablet Take 0.5 tablets (12.5 mg total) by mouth daily. 45 tablet 3   No current facility-administered medications for this encounter.    BP 116/76 (BP Location: Left Arm, Patient Position: Sitting, Cuff Size: Normal)   Pulse (!) 105   Wt 182 lb 4 oz (82.7 kg)   SpO2 100%   BMI 33.88 kg/m  General: NAD Neck: No JVD, no thyromegaly or thyroid nodule.  Lungs: Clear to auscultation bilaterally with normal respiratory effort. CV: Nondisplaced PMI.  Heart regular S1/S2, no S3/S4, no murmur.  No peripheral edema.  No carotid bruit.  Normal pedal pulses.  Abdomen: Soft, nontender, no hepatosplenomegaly, no distention.  Skin: Intact without lesions  or rashes.  Neurologic: Alert and oriented x 3.  Psych: Normal affect. Extremities: No clubbing or cyanosis.  HEENT: Normal.   Assessment/Plan: 40 yo with triple triple negative breast cancer.  She completed doxorubicin-containing chemotherapy.  I reviewed today's echo => EF is mildly decreased to 50% and global longitudinal strain is also decreased.  This may be effect from doxorubicin.  - Add Coreg 3.125 mg bid.  - Add losartan 12.5 mg daily (take in the evening).  - Repeat echo in 3 months.   Loralie Champagne 08/04/2016

## 2016-08-10 ENCOUNTER — Ambulatory Visit
Admission: RE | Admit: 2016-08-10 | Discharge: 2016-08-10 | Disposition: A | Discharge status: Home / Self Care | Payer: Medicaid Other | Source: Ambulatory Visit | Attending: Oncology | Admitting: Oncology

## 2016-08-10 DIAGNOSIS — C50411 Malignant neoplasm of upper-outer quadrant of right female breast: Secondary | ICD-10-CM

## 2016-08-10 MED ORDER — GADOBENATE DIMEGLUMINE 529 MG/ML IV SOLN
17.0000 mL | Freq: Once | INTRAVENOUS | Status: AC | PRN
  Administered 2016-08-10: 17 mL via INTRAVENOUS

## 2016-08-11 ENCOUNTER — Encounter: Payer: Self-pay | Admitting: *Deleted

## 2016-08-11 ENCOUNTER — Ambulatory Visit: Payer: Medicaid Other

## 2016-08-11 ENCOUNTER — Ambulatory Visit (HOSPITAL_BASED_OUTPATIENT_CLINIC_OR_DEPARTMENT_OTHER): Payer: Medicaid Other | Admitting: Oncology

## 2016-08-11 ENCOUNTER — Other Ambulatory Visit (HOSPITAL_BASED_OUTPATIENT_CLINIC_OR_DEPARTMENT_OTHER): Payer: Medicaid Other

## 2016-08-11 VITALS — BP 127/89 | HR 94 | Temp 97.9°F | Resp 18 | Ht 61.5 in | Wt 182.0 lb

## 2016-08-11 DIAGNOSIS — C50411 Malignant neoplasm of upper-outer quadrant of right female breast: Secondary | ICD-10-CM

## 2016-08-11 DIAGNOSIS — Z171 Estrogen receptor negative status [ER-]: Secondary | ICD-10-CM

## 2016-08-11 DIAGNOSIS — Z9221 Personal history of antineoplastic chemotherapy: Secondary | ICD-10-CM | POA: Diagnosis not present

## 2016-08-11 LAB — COMPREHENSIVE METABOLIC PANEL
ALT: 13 U/L (ref 0–55)
AST: 17 U/L (ref 5–34)
Albumin: 3.9 g/dL (ref 3.5–5.0)
Alkaline Phosphatase: 67 U/L (ref 40–150)
Anion Gap: 11 mEq/L (ref 3–11)
BUN: 6.3 mg/dL — AB (ref 7.0–26.0)
CHLORIDE: 108 meq/L (ref 98–109)
CO2: 24 meq/L (ref 22–29)
Calcium: 9.8 mg/dL (ref 8.4–10.4)
Creatinine: 0.8 mg/dL (ref 0.6–1.1)
EGFR: >90 mL/min/{1.73_m2} (ref >90)
GLUCOSE: 104 mg/dL (ref 70–140)
POTASSIUM: 3.8 meq/L (ref 3.5–5.1)
SODIUM: 143 meq/L (ref 136–145)
Total Bilirubin: 0.3 mg/dL (ref 0.20–1.20)
Total Protein: 8.3 g/dL (ref 6.4–8.3)

## 2016-08-11 LAB — CBC WITH DIFFERENTIAL/PLATELET
BASO%: 0.2 % (ref 0.0–2.0)
BASOS ABS: 0 10*3/uL (ref 0.0–0.1)
EOS%: 1.2 % (ref 0.0–7.0)
Eosinophils Absolute: 0.1 10*3/uL (ref 0.0–0.5)
HCT: 31.4 % — ABNORMAL LOW (ref 34.8–46.6)
HGB: 10.5 g/dL — ABNORMAL LOW (ref 11.6–15.9)
LYMPH%: 19.5 % (ref 14.0–49.7)
MCH: 33.8 pg (ref 25.1–34.0)
MCHC: 33.5 g/dL (ref 31.5–36.0)
MCV: 100.7 fL (ref 79.5–101.0)
MONO#: 0.5 10*3/uL (ref 0.1–0.9)
MONO%: 7.8 % (ref 0.0–14.0)
NEUT#: 5 10*3/uL (ref 1.5–6.5)
NEUT%: 71.3 % (ref 38.4–76.8)
Platelets: 138 10*3/uL — ABNORMAL LOW (ref 145–400)
RBC: 3.12 10*6/uL — AB (ref 3.70–5.45)
RDW: 16.8 % — AB (ref 11.2–14.5)
WBC: 7 10*3/uL (ref 3.9–10.3)
lymph#: 1.4 10*3/uL (ref 0.9–3.3)

## 2016-08-11 MED ORDER — LOSARTAN POTASSIUM 25 MG PO TABS: 12.5000 mg | ORAL_TABLET | Freq: Every day | ORAL | 3 refills | Status: DC

## 2016-08-11 NOTE — Progress Notes (Signed)
Rebecca [name]  [phone]) [phone] [phone]   ID: Rebecca [name] DOB: Aug 05, 1976  MR#: [id number]  TTS#:[id number]  Patient Care Team: Rebecca Dew, FNP as PCP - General (Family Medicine) Rebecca Overall, MD as Consulting Physician (General Surgery) Rebecca Cruel, MD as Consulting Physician (Oncology) Rebecca Pray, MD as Consulting Physician (Radiation Oncology) Rebecca Cheese, NP as Nurse Practitioner (Hematology and Oncology) Rebecca Norway, RN as Registered Nurse PCP: Rebecca Dew, FNP GYN: OTHER MD:  CHIEF COMPLAINT: triple negative breast cancer  CURRENT TREATMENT: Awaiting definitive surgery.  BREAST CANCER HISTORY: From the original intake note:  Rebecca [name] herself noted a change in her right breast sometime around September or October 2016. She did not bring it to intermediate medical attention, but on 01/13/2016 she established herself in Dr. Smith Robert' service and she was set up for bilateral diagnostic mammography with tomosynthesis and bilateral ultrasonography at the [facility] 01/19/2016. The breast density was category B. In the upper outer quadrant of the right breast there was a spiculated mass measuring 2.8 cm. On physical exam this was palpable. Targeted ultrasonography confirmed an irregular hypoechoic mass in the right breast 11:30 o'clock position measuring 2.6 cm maximally. Ultrasound of the right axilla showed a morphologically abnormal lymph node.  In the left breast there were some tubular densities behind the areola which by ultrasonography showed benign ductal ectasia.  On 01/28/2016 Rebecca [name] underwent biopsy of the right breast mass and abnormal right axillary lymph node. The pathology from this procedure (S AAA [id number]) showed the lymph node to be benign. In the breast however there was an invasive ductal carcinoma, grade 3, which was estrogen and progesterone receptor negative. The proliferation marker was 70%. HER-2 was not  amplified with a signals ratio of 1.32. The number per cell was 2.05.  The patient's subsequent history is as detailed below  INTERVAL HISTORY: Rebecca [name] returns today for follow-up of her triple negative breast cancer. The research nurse was also present in today's meeting.  REVIEW OF SYSTEMS: Rebecca [name] has recovered nicely from chemotherapy and her hemoglobin is already on 10. She's had mild headaches here and there, which she attributes probably correctly to sinus problems. Her peripheral neuropathy is minimal, clearly grade 1, and not progressive. She continues depressed. She sleeps poorly. Aside from these issues a detailed review of systems today was stable  PAST MEDICAL HISTORY: Past Medical History:  Diagnosis Date  . Anxiety   . Breast cancer ([place])   . Depression   . GERD (gastroesophageal reflux disease)   . Hypertension    pt states is currently on no medications   . Obesity (BMI 35.0-39.9 without comorbidity) ([place])   . Seasonal allergies   . Sickle cell trait ([place])   . Termination of pregnancy (fetus) 04/02/16    PAST SURGICAL HISTORY: Past Surgical History:  Procedure Laterality Date  . CESAREAN SECTION     2004 and 2007    FAMILY HISTORY Family History  Problem Relation Age of Onset  . Hypertension Mother   . Uterine cancer Mother   . Breast cancer Cousin   . Cancer Father   . Hypertension Father   . Breast cancer Maternal Aunt   . Prostate cancer Maternal Uncle   . Breast cancer Maternal Grandmother   . Throat cancer Maternal Grandfather   The patient has very little information about her father. Her mother is currently 86 years old. She had a history of cervical cancer at age 85. The patient had 2 brothers,  no sisters. The maternal grandfather had throat cancer. A maternal uncle was diagnosed with breast cancer as well as prostate cancer at the age of 70. 2 maternal cousins, one of the mail, had breast cancer as well.  GYNECOLOGIC HISTORY:  No LMP  recorded. Menarche age 58, first live birth age 42. The patient is GX P4. She still having regular periods. She took oral contraceptives in the 1990s with no side effects.  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 The patient's significant other Rebecca [name] works at [employer]. In addition to them the patient's 3 children Rebecca [name], Rebecca [name] and Rebecca [name] Current are also in the home. There are age 27, 87, and 38 as of August 2017. The patient's son [name], currently 46 years old, lives in [place].    ADVANCED DIRECTIVES: Not in place   HEALTH MAINTENANCE: Social History  Substance Use Topics  . Smoking status: Current Every Day Smoker    Packs/day: 0.25    Years: 20.00    Types: Cigarettes  . Smokeless tobacco: Never Used  . Alcohol use Yes     Comment: occ     Colonoscopy:  PAP:  Bone density:  Lipid panel:  No Known Allergies  Current Outpatient Prescriptions  Medication Sig Dispense Refill  . ketoconazole (NIZORAL) 2 % cream Apply 1 application topically daily. 15 g 0  . LORazepam (ATIVAN) 0.5 MG tablet Take 1 tablet (0.5 mg total) by mouth at bedtime. 30 tablet 0  . omeprazole (PRILOSEC) 20 MG capsule Take 1 capsule (20 mg total) by mouth daily. 30 capsule 3  . potassium chloride (MICRO-K) 10 MEQ CR capsule Take 1 capsule (10 mEq total) by mouth daily. 30 capsule 3  . venlafaxine XR (EFFEXOR-XR) 75 MG 24 hr capsule Take 1 capsule (75 mg total) by mouth daily with breakfast. 30 capsule 6   No current facility-administered medications for this visit.    OBJECTIVE: [name] Who appears stated age Vitals:   08/11/16 0849  BP: 127/89  Pulse: 94  Resp: 18  Temp: 97.9 F (36.6 C)   Body mass index is 33.83 kg/m.   ECOG FS:1 - Symptomatic but completely ambulatory  Sclerae unicteric, EOMs intact Oropharynx clear and moist No cervical or supraclavicular  adenopathy Lungs no rales or rhonchi Heart regular rate and rhythm Abd soft, nontender, positive bowel sounds MSK no focal spinal tenderness, no upper extremity lymphedema Neuro: nonfocal, well oriented, appropriate affect Breasts: Deferred   LAB RESULTS:  CMP     Component Value Date/Time   NA 142 07/21/2016 0826   K 3.1 (L) 07/21/2016 0826   CL 106 04/11/2016 1045   CO2 23 07/21/2016 0826   GLUCOSE 124 07/21/2016 0826   BUN 8.8 07/21/2016 0826   CREATININE 0.8 07/21/2016 0826   CALCIUM 9.5 07/21/2016 0826   PROT 7.5 07/21/2016 0826   ALBUMIN 3.9 07/21/2016 0826   AST 17 07/21/2016 0826   ALT 18 07/21/2016 0826   ALKPHOS 57 07/21/2016 0826   BILITOT 0.35 07/21/2016 0826   GFRNONAA >60 04/11/2016 1045   GFRNONAA >89 01/12/2016 1115   GFRAA >60 04/11/2016 1045   GFRAA >89 01/12/2016 1115    INo results found for: SPEP, UPEP  Lab Results  Component Value Date   WBC 4.2 07/21/2016   NEUTROABS 3.0 07/21/2016   HGB 9.3 (L) 07/21/2016   HCT 27.8 (L) 07/21/2016  MCV 99.3 07/21/2016   PLT 137 (L) 07/21/2016      Chemistry      Component Value Date/Time   NA 142 07/21/2016 0826   K 3.1 (L) 07/21/2016 0826   CL 106 04/11/2016 1045   CO2 23 07/21/2016 0826   BUN 8.8 07/21/2016 0826   CREATININE 0.8 07/21/2016 0826      Component Value Date/Time   CALCIUM 9.5 07/21/2016 0826   ALKPHOS 57 07/21/2016 0826   AST 17 07/21/2016 0826   ALT 18 07/21/2016 0826   BILITOT 0.35 07/21/2016 0826       No results found for: LABCA2  No components found for: LABCA125  No results for input(s): INR in the last 168 hours.  Urinalysis    Component Value Date/Time   LABSPEC 1.020 01/12/2016 1122   PHURINE 5.5 01/12/2016 1122   GLUCOSEU NEGATIVE 01/12/2016 1122   HGBUR TRACE (A) 01/12/2016 1122   BILIRUBINUR NEGATIVE 01/12/2016 1122   KETONESUR NEGATIVE 01/12/2016 1122   PROTEINUR NEGATIVE 01/12/2016 1122   UROBILINOGEN 0.2 01/12/2016 1122   NITRITE NEGATIVE  01/12/2016 1122   LEUKOCYTESUR NEGATIVE 01/12/2016 1122    ELIGIBLE FOR AVAILABLE RESEARCH PROTOCOL: ASA, B-WELL  STUDIES: ------------------------------------------------------------------- Transthoracic Echocardiography  Patient:    Rebecca [name], Rebecca [name] MR #:       [id number] Study Date: 08/03/2016 Gender:     F Age:        89 Height:     156.2 cm Weight:     81.2 kg BSA:        1.91 m^2 Pt. Status: Room:   ATTENDING    Loralie Champagne, M.D.  ORDERING     Loralie Champagne, M.D.  SONOGRAPHER  [facility]  PERFORMING   [facility]patient  REFERRING    Cammie Sickle M  cc:  ------------------------------------------------------------------- LV EF: 50%  CLINICAL DATA:  40 year old female with biopsy proven invasive ductal carcinoma in the right breast. Patient's MRI was performed on 04/26/2016. Multiple attempts were made to contact the patient for repeat imaging secondary to motion. The patient never returned phone calls for repeat imaging. Leo Rod, nurse practitioner working with Dr. [name] was contacted about the repeat imaging, she states that the patient is having another MRI in 3 weeks following neoadjuvant chemotherapy. The patient will not return for repeat imaging until that time.  LABS:  None obtained at the time of imaging.  EXAM: BILATERAL BREAST MRI WITH AND WITHOUT CONTRAST  TECHNIQUE: Multiplanar, multisequence MR images of both breasts were obtained prior to and following the intravenous administration of 16 ml of MultiHance.  THREE-DIMENSIONAL MR IMAGE RENDERING ON INDEPENDENT WORKSTATION:  Three-dimensional MR images were rendered by post-processing of the original MR data on an independent workstation. The three-dimensional MR images were interpreted, and findings are reported in the following complete MRI report for this study. Three dimensional images were evaluated at the independent DynaCad workstation  COMPARISON:  Previous  exam(s).  FINDINGS: The exam is sub optimal secondary to motion. Motion is more pronounced with the left breast.  Breast composition: b. Scattered fibroglandular tissue.  Background parenchymal enhancement: Moderate.  Right breast: There is a 3.0 x 2.7 x 3.0 cm irregular enhancing mass in the central right breast. There is a signal void artifact in the mass corresponding with the biopsy clip.  Left breast: Imaging of the left breast is sub optimal secondary to motion. There is a left-sided Port-A-Cath. No suspicious mass or abnormal enhancement seen on the sub optimal images.  Lymph  nodes: Two level I right axillary lymph nodes measuring 1.1 and 1.3 cm are seen. The cortices seen mildly prominent. A right axillary lymph node was biopsied on 01/28/2016 and was benign.  Ancillary findings:  None.  IMPRESSION: 3.0 cm mass in the central right breast corresponding with the biopsy proven invasive ductal carcinoma. Sub optimal imaging of the left breast secondary to motion. The patient will have a repeat MRI following neoadjuvant chemotherapy.  RECOMMENDATION: Treatment planning of the known right breast invasive ductal carcinoma is recommended.  BI-RADS CATEGORY  6: Known biopsy-proven malignancy.   Electronically Signed   By: Lillia Mountain M.D.   On: 05/09/2016 13:08    ASSESSMENT: 40 y.o. [place] woman status post right breast upper-outer quadrant biopsy 01/28/2016 for a clinical T2 N0 invasive ductal carcinoma, grade 3, triple negative, with an MIB-1 of 70%.  (a) suspicious right axillary lymph node biopsied 01/28/2016 was benign  (1) neoadjuvant chemotherapy: doxorubicin and cyclophosphamide in dose dense fashion 4 started 04/14/16, completed 05/26/2016, followed by paclitaxel and carboplatin weekly 12, Started 06/09/2016  (a) taxol discontinued after 7 doses because of neuropathy, last dose 07/21/2016  (2) genetics testing is scheduled for  09/08/2016  (3) definitive surgery pending  (4) adjuvant radiation to follow as appropriate  PLAN: I gave Rebecca [name] a copy of her breast MRI, which shows a near 50% reduction in her tumor. She did a very good job at sticking with the chemotherapy and made a great efforts to comply with her treatments. I am hopeful this will translate into a long-term benefit for her.  The next step is surgery. She tells me she will be calling her surgeons office in the next day or 2 to get a specific date for her surgery.  I think she will qualify for the aspirin and be well studies and she is receiving information on that today.  She will have genetics testing October 5. I have made her a return appointment with me for that same morning. By then we should have the final pathology from her surgery and will be able to decide whether we want to add capecitabine to her radiation treatments were not  She tells me her [facility] told her the losartan was not signed. I don't understand that but in any case that reordered it and I have asked our nurse to call the pharmacy and make sure that prescription is in therefore  She knows to call for any problems that [name] develop before her next visit here.   Rebecca Cruel, MD 08/11/2016

## 2016-08-18 ENCOUNTER — Other Ambulatory Visit: Payer: Medicaid Other

## 2016-08-18 ENCOUNTER — Ambulatory Visit: Payer: Medicaid Other | Admitting: Oncology

## 2016-08-18 ENCOUNTER — Ambulatory Visit: Payer: Medicaid Other

## 2016-08-25 ENCOUNTER — Ambulatory Visit: Payer: Medicaid Other

## 2016-08-25 ENCOUNTER — Ambulatory Visit: Payer: Medicaid Other | Admitting: Oncology

## 2016-08-25 ENCOUNTER — Other Ambulatory Visit: Payer: Medicaid Other

## 2016-08-25 ENCOUNTER — Other Ambulatory Visit: Payer: Self-pay | Admitting: Surgery

## 2016-08-25 DIAGNOSIS — C50911 Malignant neoplasm of unspecified site of right female breast: Secondary | ICD-10-CM

## 2016-08-29 ENCOUNTER — Telehealth: Payer: Self-pay | Admitting: Oncology

## 2016-08-29 NOTE — Telephone Encounter (Signed)
Unable to reach pt to inform her of appt change to 10/31 per GM

## 2016-08-31 ENCOUNTER — Encounter (HOSPITAL_BASED_OUTPATIENT_CLINIC_OR_DEPARTMENT_OTHER): Payer: Self-pay | Admitting: *Deleted

## 2016-09-01 ENCOUNTER — Telehealth (HOSPITAL_COMMUNITY): Payer: Self-pay | Admitting: Pharmacist

## 2016-09-01 NOTE — Telephone Encounter (Signed)
Losartan PA approved by Medicaid through 07/30/17.   Ruta Hinds. Velva Harman, PharmD, BCPS, CPP Clinical Pharmacist Pager: [phone] Phone: [phone] 09/01/2016 3:12 PM

## 2016-09-02 NOTE — OR Nursing (Signed)
Patient in today for pick up of Boost Breeze. Patient verbalized an understanding of instructions; complete Boost by 6am. To arrive at [place], RN BSN CAPA, AD/[facility]SC.

## 2016-09-05 NOTE — H&P (Signed)
Rebecca [name]  Location: [facility] Patient #: [id number] DOB: 15-Jul-1976 Single / Language: [name] / Race: Black or African American Female  History of Present Illness   The patient is a 40 year old female who presents with breast cancer.   Her PCP is Dr. Cammie [name]  She presented to the [facility] [place] - Drs. Rebecca [name].  She is accompanied with her mother.  She has completed neoadjuvant chemotx by Dr. Jana [name] - doxorubicin and cyclophosphamide followed by paclitaxel and carboplatin. The taxol was stopped after 7 does because of neuropathy. She had an MRI on 08/10/2016 - the mass in her right breast measures measures 1.4 x 1.8 x 1.9 cm.. This is a significant decrease in the size of the primary cancer. There are no axillary nodes of concern. In reading Dr. Virgie [name] note - he said he gave her a copy, but the patient said that she did not get it. I gave her a copy of the report and reviewed it with her. I also received a note from Dr. Jana [name] that I can remove the power port at the time of breast cancer surgery. We had a legthy discussion about the next step for the treatment of her breast cancer. She is a good candidate for a lumpectomy. But she had a grandmother who died of breast/gyn cancer and this weighs on her mind. I told her there was no survival benefit to mastectomy over lumpectomy. But she wants a mastectomy. Secondly, I talked about reconstruction. (In fact Rebecca [name] had FAXed info to plastics - then the patient changed her mind) At this time she is not interested in reconstruction. I told her she could always get reconstruction in the future. I discussed the options of lymph node biopsy. The risks of surgery include, but are not limited to, bleeding, infection, the need for further surgery, and nerve injury. I gave her one of our breast cancer books.  So our plan is: 1) proceed with right mastectomy and right  axillary SLNBx. 2) She is going to call the cancer [name] (I gave her Rebecca [name]'s name) to work on getting genetic testing.  History of breast cancer: She felt something in her right breast. Her last menstrual period was recently. She is not on hormone therapy. She has a very strong family history of breast cancer with a grandmother, and uncle, and a female first cousin with breast cancer.  She had a mammogram at [facility] on 01/28/2016 1. Spiculated mass within the RIGHT breast at the 11:30 o'clock axis, 5 cm from the nipple, measuring 2.6 x 2.3 x 2.5 cm, corresponding to patient's palpable abnormality. 2. Morphologically abnormal lymph node within the right axilla, mildly prominent with asymmetric cortex thickening to 5 mm, for which ultrasound-guided biopsy is also recommended. She underwent a biopsy on 01/28/[phone]) which showed grade 3 IDC, ER - 0%, PR - 0%, Ki67 - 70%, Her2Neu - neg. Right axillary lymph node biopsy was benign.  Past Medical History: 1) Smokes I gave her a prescription for Nicoderm in March. She can still use this. If it is no good, we can call in a new prescription. 2) [name] cell trait - She is seen at [name] by Dr. Matthew [name] 3) Power port placed by IR on 04/11/2016 She was pregnant when I had scheduled her port - so she got an abortion, then the procedure was done by IR.  Social History: Unmarried. (Not really divorced, though she has not seen her husband for  20 years) Has 4 children - 2 are special needs.  Rebecca [name] - 40 yo in [name], [place] 40 yo, Rebecca [name] - 41 yo (she has some special need), Rebecca [name] - 42 yo (he has [name] cell and she has been at Rebecca [name] with him) The three youngest live with her.  The "[name]" Rebecca [name]) helps out, but it does not sound like he lives with her. He has to take care of his own mother. She comes with her mother,  Rebecca [name]. She has not worked since last summer (2016). 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   Addendum Note(Rebecca Arscott H. Rebecca Gaskins MD; 08/25/2016 12:25 PM)  She has a neuropathy of her hands from the chemotx. This is not better. She also got bilateral hip pain and weakness. That is better.  She also saw Dr. Einar [name] for her heart. Her EF is down a little. He put her on Coreg and Losartan. There are plans to repeat the echo in 3 months.   Allergies Rebecca [name], CMA; 08/25/2016 11:12 AM) No Known Drug Allergies09/21/2017  Medication History Rebecca [name], CMA; 08/25/2016 11:14 AM) Losartan Potassium (25MG Tablet, Oral half of a tablet daily) Active. No Current Medications (Taken starting 08/25/2016) Coreg (3.125MG Tablet, Oral) Active. Potassium Chloride (10MEQ Capsule ER, Oral) Active. Ketoconazole (2% Cream, External) Active. Effexor (75MG Tablet, Oral) Active. LORazepam (0.5MG Tablet, Oral) Active. Omeprazole (20MG Capsule DR, Oral) Active. Medications Reconciled  Vitals Rebecca [name] CMA; 08/25/2016 11:14 AM) 08/25/2016 11:14 AM Weight: 180 lb Height: 61.5in Body Surface Area: 1.82 m Body Mass Index: 33.46 kg/m  Temp.: 97.65F(Temporal)  Pulse: 83 (Regular)  Resp.: 17 (Unlabored)  BP: 128/78 (Sitting, Left Arm, Standard)  Physical Exam  General: WN AA F alert and generally healthy appearing. HEENT: Normal. Pupils equal.  Neck: Supple. No mass. No thyroid mass.  Lymph Nodes: No supraclavicular or cervical nodes.  Lungs: Clear to auscultation and symmetric breath sounds. Port in left upper chest - left IJ Heart: RRR. No murmur or rub.  Breast: Right - Somewhat ptotic. I cannot fell a right breast mass now. Left - Somewhat ptotic. No mass or nodule. She has powder under both breast  Abdomen: Soft. No mass. No tenderness. No hernia. Normal bowel sounds.   Extremities: Good strength and ROM in  upper and lower extremities.  Neurologic: Grossly intact to motor and sensory function. Psychiatric: Has normal mood and affect. Behavior is normal.   Assessment & Plan  1.  BREAST CANCER, STAGE 1, RIGHT (C50.911)  Story: She underwent a biopsy on 01/28/2016 ([id number]) which showed grade 3 IDC, ER - 0%, PR - 0%, Ki67 - 70%, Her2Neu - neg. Right axillary lymph node biopsy was benign.   Completed neoadjuvant chemotx  Oncology - Drs. [name] and Rebecca [name]  Plan:   1) Proceed with right mastectomy and right axillary sentinel lymph node biopsy   2) Genetic testing - she will call Rebecca [name] to set this up.  2.  Smokes 3.  [name] cell trait - She is seen at the [name] by Dr. Matthew [name] 4.  Power port placed by IR on 04/11/2016  Plan to remove this (note from Dr. Jana [name] - 08/21/2016)   Alphonsa Overall, MD, [facility] Pager: [phone] Office phone:  [phone]

## 2016-09-06 ENCOUNTER — Encounter (HOSPITAL_BASED_OUTPATIENT_CLINIC_OR_DEPARTMENT_OTHER): Payer: Self-pay | Admitting: *Deleted

## 2016-09-06 ENCOUNTER — Ambulatory Visit (HOSPITAL_BASED_OUTPATIENT_CLINIC_OR_DEPARTMENT_OTHER)
Admission: RE | Admit: 2016-09-06 | Discharge: 2016-09-07 | Disposition: A | Discharge status: Home / Self Care | Payer: Medicaid Other | Source: Ambulatory Visit | Attending: Surgery | Admitting: Surgery

## 2016-09-06 ENCOUNTER — Ambulatory Visit (HOSPITAL_COMMUNITY)
Admission: RE | Admit: 2016-09-06 | Discharge: 2016-09-06 | Disposition: A | Discharge status: Home / Self Care | Payer: Medicaid Other | Source: Ambulatory Visit | Attending: Surgery | Admitting: Surgery

## 2016-09-06 ENCOUNTER — Ambulatory Visit (HOSPITAL_BASED_OUTPATIENT_CLINIC_OR_DEPARTMENT_OTHER): Payer: Medicaid Other | Admitting: Anesthesiology

## 2016-09-06 ENCOUNTER — Encounter (HOSPITAL_BASED_OUTPATIENT_CLINIC_OR_DEPARTMENT_OTHER)
Admission: RE | Disposition: A | Discharge status: Home / Self Care | Payer: Self-pay | Source: Ambulatory Visit | Attending: Surgery

## 2016-09-06 DIAGNOSIS — I10 Essential (primary) hypertension: Secondary | ICD-10-CM | POA: Diagnosis not present

## 2016-09-06 DIAGNOSIS — C50911 Malignant neoplasm of unspecified site of right female breast: Secondary | ICD-10-CM

## 2016-09-06 DIAGNOSIS — Z6833 Body mass index (BMI) 33.0-33.9, adult: Secondary | ICD-10-CM | POA: Diagnosis not present

## 2016-09-06 DIAGNOSIS — C773 Secondary and unspecified malignant neoplasm of axilla and upper limb lymph nodes: Secondary | ICD-10-CM | POA: Diagnosis not present

## 2016-09-06 DIAGNOSIS — C50411 Malignant neoplasm of upper-outer quadrant of right female breast: Secondary | ICD-10-CM | POA: Diagnosis not present

## 2016-09-06 DIAGNOSIS — F418 Other specified anxiety disorders: Secondary | ICD-10-CM | POA: Diagnosis not present

## 2016-09-06 DIAGNOSIS — E669 Obesity, unspecified: Secondary | ICD-10-CM | POA: Insufficient documentation

## 2016-09-06 DIAGNOSIS — K219 Gastro-esophageal reflux disease without esophagitis: Secondary | ICD-10-CM | POA: Diagnosis not present

## 2016-09-06 DIAGNOSIS — F17200 Nicotine dependence, unspecified, uncomplicated: Secondary | ICD-10-CM | POA: Insufficient documentation

## 2016-09-06 DIAGNOSIS — Z171 Estrogen receptor negative status [ER-]: Secondary | ICD-10-CM | POA: Diagnosis not present

## 2016-09-06 DIAGNOSIS — Z9221 Personal history of antineoplastic chemotherapy: Secondary | ICD-10-CM | POA: Insufficient documentation

## 2016-09-06 DIAGNOSIS — D573 Sickle-cell trait: Secondary | ICD-10-CM | POA: Diagnosis not present

## 2016-09-06 HISTORY — PX: MASTECTOMY W/ SENTINEL NODE BIOPSY: SHX2001

## 2016-09-06 HISTORY — PX: PORT-A-CATH REMOVAL: SHX5289

## 2016-09-06 SURGERY — MASTECTOMY WITH SENTINEL LYMPH NODE BIOPSY
Anesthesia: Regional | Site: Chest | Laterality: Right

## 2016-09-06 MED ORDER — LORAZEPAM 0.5 MG PO TABS
0.5000 mg | ORAL_TABLET | Freq: Every day | ORAL | Status: DC
  Administered 2016-09-06: 0.5 mg via ORAL

## 2016-09-06 MED ORDER — GLYCOPYRROLATE 0.2 MG/ML IJ SOLN: 0.2000 mg | Freq: Once | INTRAMUSCULAR | Status: DC | PRN

## 2016-09-06 MED ORDER — SODIUM CHLORIDE 0.9 % IJ SOLN
INTRAMUSCULAR | Status: AC
  Filled 2016-09-06: qty 10

## 2016-09-06 MED ORDER — ONDANSETRON HCL 4 MG/2ML IJ SOLN
INTRAMUSCULAR | Status: AC
  Filled 2016-09-06: qty 2

## 2016-09-06 MED ORDER — ACETAMINOPHEN 325 MG PO TABS: 325.0000 mg | ORAL_TABLET | ORAL | Status: DC | PRN

## 2016-09-06 MED ORDER — LACTATED RINGERS IV SOLN
INTRAVENOUS | Status: DC
  Administered 2016-09-06 (×3): via INTRAVENOUS

## 2016-09-06 MED ORDER — FENTANYL CITRATE (PF) 100 MCG/2ML IJ SOLN
INTRAMUSCULAR | Status: AC
  Filled 2016-09-06: qty 2

## 2016-09-06 MED ORDER — BUPIVACAINE-EPINEPHRINE (PF) 0.5% -1:200000 IJ SOLN
INTRAMUSCULAR | Status: DC | PRN
  Administered 2016-09-06: 20 mL via PERINEURAL

## 2016-09-06 MED ORDER — LIDOCAINE 2% (20 MG/ML) 5 ML SYRINGE
INTRAMUSCULAR | Status: AC
  Filled 2016-09-06: qty 5

## 2016-09-06 MED ORDER — MORPHINE SULFATE (PF) 2 MG/ML IV SOLN: 1.0000 mg | INTRAVENOUS | Status: DC | PRN

## 2016-09-06 MED ORDER — OXYCODONE HCL 5 MG/5ML PO SOLN: 5.0000 mg | Freq: Once | ORAL | Status: DC | PRN

## 2016-09-06 MED ORDER — 0.9 % SODIUM CHLORIDE (POUR BTL) OPTIME
TOPICAL | Status: DC | PRN
  Administered 2016-09-06: 1000 mL

## 2016-09-06 MED ORDER — CHLORHEXIDINE GLUCONATE CLOTH 2 % EX PADS: 6.0000 | MEDICATED_PAD | Freq: Once | CUTANEOUS | Status: DC

## 2016-09-06 MED ORDER — ONDANSETRON HCL 4 MG/2ML IJ SOLN: 4.0000 mg | Freq: Four times a day (QID) | INTRAMUSCULAR | Status: DC | PRN

## 2016-09-06 MED ORDER — TECHNETIUM TC 99M SULFUR COLLOID FILTERED
1.0000 | Freq: Once | INTRAVENOUS | Status: AC | PRN
  Administered 2016-09-06: 1 via INTRADERMAL

## 2016-09-06 MED ORDER — DEXAMETHASONE SODIUM PHOSPHATE 10 MG/ML IJ SOLN
INTRAMUSCULAR | Status: AC
  Filled 2016-09-06: qty 1

## 2016-09-06 MED ORDER — IBUPROFEN 600 MG PO TABS: 600.0000 mg | ORAL_TABLET | Freq: Four times a day (QID) | ORAL | Status: DC | PRN

## 2016-09-06 MED ORDER — OXYCODONE HCL 5 MG PO TABS: 5.0000 mg | ORAL_TABLET | Freq: Once | ORAL | Status: DC | PRN

## 2016-09-06 MED ORDER — EPHEDRINE SULFATE 50 MG/ML IJ SOLN
INTRAMUSCULAR | Status: DC | PRN
  Administered 2016-09-06 (×2): 10 mg via INTRAVENOUS

## 2016-09-06 MED ORDER — LOSARTAN POTASSIUM 25 MG PO TABS: 12.5000 mg | ORAL_TABLET | Freq: Every day | ORAL | Status: DC

## 2016-09-06 MED ORDER — HEPARIN SODIUM (PORCINE) 5000 UNIT/ML IJ SOLN
INTRAMUSCULAR | Status: AC
  Filled 2016-09-06: qty 1

## 2016-09-06 MED ORDER — CEFAZOLIN SODIUM-DEXTROSE 2-4 GM/100ML-% IV SOLN
INTRAVENOUS | Status: AC
  Filled 2016-09-06: qty 100

## 2016-09-06 MED ORDER — PROPOFOL 10 MG/ML IV BOLUS
INTRAVENOUS | Status: DC | PRN
Start: 2016-09-06 — End: 2016-09-06
  Administered 2016-09-06: 180 mg via INTRAVENOUS

## 2016-09-06 MED ORDER — METHYLENE BLUE 0.5 % INJ SOLN
INTRAVENOUS | Status: AC
  Filled 2016-09-06: qty 10

## 2016-09-06 MED ORDER — HEPARIN SODIUM (PORCINE) 5000 UNIT/ML IJ SOLN
5000.0000 [IU] | Freq: Three times a day (TID) | INTRAMUSCULAR | Status: DC
  Administered 2016-09-06: 5000 [IU] via SUBCUTANEOUS

## 2016-09-06 MED ORDER — CARVEDILOL 3.125 MG PO TABS
3.1250 mg | ORAL_TABLET | Freq: Two times a day (BID) | ORAL | Status: DC
  Administered 2016-09-06: 3.125 mg via ORAL

## 2016-09-06 MED ORDER — SCOPOLAMINE 1 MG/3DAYS TD PT72: 1.0000 | MEDICATED_PATCH | Freq: Once | TRANSDERMAL | Status: DC | PRN

## 2016-09-06 MED ORDER — FENTANYL CITRATE (PF) 100 MCG/2ML IJ SOLN
25.0000 ug | INTRAMUSCULAR | Status: DC | PRN
  Administered 2016-09-06 (×3): 25 ug via INTRAVENOUS

## 2016-09-06 MED ORDER — KCL IN DEXTROSE-NACL 20-5-0.45 MEQ/L-%-% IV SOLN
INTRAVENOUS | Status: DC
  Administered 2016-09-06 – 2016-09-07 (×2): via INTRAVENOUS
  Filled 2016-09-06 (×2): qty 1000

## 2016-09-06 MED ORDER — BUPIVACAINE-EPINEPHRINE (PF) 0.5% -1:200000 IJ SOLN
INTRAMUSCULAR | Status: AC
  Filled 2016-09-06: qty 30

## 2016-09-06 MED ORDER — MIDAZOLAM HCL 2 MG/2ML IJ SOLN
INTRAMUSCULAR | Status: AC
  Filled 2016-09-06: qty 2

## 2016-09-06 MED ORDER — ACETAMINOPHEN 160 MG/5ML PO SOLN: 325.0000 mg | ORAL | Status: DC | PRN

## 2016-09-06 MED ORDER — PROPOFOL 10 MG/ML IV BOLUS
INTRAVENOUS | Status: AC
  Filled 2016-09-06: qty 20

## 2016-09-06 MED ORDER — DEXAMETHASONE SODIUM PHOSPHATE 4 MG/ML IJ SOLN
INTRAMUSCULAR | Status: DC | PRN
  Administered 2016-09-06: 5 mg via INTRAVENOUS

## 2016-09-06 MED ORDER — CEFAZOLIN SODIUM-DEXTROSE 2-4 GM/100ML-% IV SOLN
2.0000 g | INTRAVENOUS | Status: AC
  Administered 2016-09-06: 2 g via INTRAVENOUS

## 2016-09-06 MED ORDER — PROMETHAZINE HCL 25 MG/ML IJ SOLN: 6.2500 mg | INTRAMUSCULAR | Status: DC | PRN

## 2016-09-06 MED ORDER — VENLAFAXINE HCL ER 75 MG PO CP24: 75.0000 mg | ORAL_CAPSULE | Freq: Every day | ORAL | Status: DC

## 2016-09-06 MED ORDER — HYDROCODONE-ACETAMINOPHEN 5-325 MG PO TABS
1.0000 | ORAL_TABLET | ORAL | Status: DC | PRN
  Administered 2016-09-06 – 2016-09-07 (×3): 1 via ORAL
  Filled 2016-09-06 (×3): qty 1

## 2016-09-06 MED ORDER — LIDOCAINE 2% (20 MG/ML) 5 ML SYRINGE
INTRAMUSCULAR | Status: DC | PRN
  Administered 2016-09-06: 40 mg via INTRAVENOUS

## 2016-09-06 MED ORDER — FENTANYL CITRATE (PF) 100 MCG/2ML IJ SOLN
50.0000 ug | INTRAMUSCULAR | Status: AC | PRN
  Administered 2016-09-06: 50 ug via INTRAVENOUS
  Administered 2016-09-06 (×5): 25 ug via INTRAVENOUS

## 2016-09-06 MED ORDER — ONDANSETRON 4 MG PO TBDP: 4.0000 mg | ORAL_TABLET | Freq: Four times a day (QID) | ORAL | Status: DC | PRN

## 2016-09-06 MED ORDER — MIDAZOLAM HCL 2 MG/2ML IJ SOLN
1.0000 mg | INTRAMUSCULAR | Status: DC | PRN
  Administered 2016-09-06: 1 mg via INTRAVENOUS
  Administered 2016-09-06: 2 mg via INTRAVENOUS

## 2016-09-06 SURGICAL SUPPLY — 77 items
ADH SKN CLS APL DERMABOND .7 (GAUZE/BANDAGES/DRESSINGS) ×4
APL SKNCLS STERI-STRIP NONHPOA (GAUZE/BANDAGES/DRESSINGS)
APPLIER CLIP 11 MED OPEN (CLIP) ×4
APPLIER CLIP 9.375 MED OPEN (MISCELLANEOUS) ×4
APR CLP MED 11 20 MLT OPN (CLIP) ×2
APR CLP MED 9.3 20 MLT OPN (MISCELLANEOUS) ×2
BANDAGE ACE 6X5 VEL STRL LF (GAUZE/BANDAGES/DRESSINGS) IMPLANT
BENZOIN TINCTURE PRP APPL 2/3 (GAUZE/BANDAGES/DRESSINGS) IMPLANT
BINDER BREAST LRG (GAUZE/BANDAGES/DRESSINGS) IMPLANT
BINDER BREAST MEDIUM (GAUZE/BANDAGES/DRESSINGS) IMPLANT
BINDER BREAST XLRG (GAUZE/BANDAGES/DRESSINGS) ×2 IMPLANT
BINDER BREAST XXLRG (GAUZE/BANDAGES/DRESSINGS) IMPLANT
BIOPATCH RED 1 DISK 7.0 (GAUZE/BANDAGES/DRESSINGS) ×2 IMPLANT
BIOPATCH RED 1IN DISK 7.0MM (GAUZE/BANDAGES/DRESSINGS) ×2
BLADE CLIPPER SURG (BLADE) IMPLANT
BLADE SURG 10 STRL SS (BLADE) ×6 IMPLANT
BLADE SURG 15 STRL LF DISP TIS (BLADE) ×2 IMPLANT
BLADE SURG 15 STRL SS (BLADE)
CANISTER SUCT 1200ML W/VALVE (MISCELLANEOUS) ×4 IMPLANT
CHLORAPREP W/TINT 26ML (MISCELLANEOUS) ×4 IMPLANT
CLIP APPLIE 11 MED OPEN (CLIP) IMPLANT
CLIP APPLIE 9.375 MED OPEN (MISCELLANEOUS) IMPLANT
CLOSURE WOUND 1/2 X4 (GAUZE/BANDAGES/DRESSINGS)
CLOSURE WOUND 1/4X4 (GAUZE/BANDAGES/DRESSINGS)
COVER BACK TABLE 60X90IN (DRAPES) ×4 IMPLANT
COVER MAYO STAND STRL (DRAPES) ×4 IMPLANT
COVER PROBE W GEL 5X96 (DRAPES) ×4 IMPLANT
DECANTER SPIKE VIAL GLASS SM (MISCELLANEOUS) IMPLANT
DERMABOND ADVANCED (GAUZE/BANDAGES/DRESSINGS) ×4
DERMABOND ADVANCED .7 DNX12 (GAUZE/BANDAGES/DRESSINGS) ×2 IMPLANT
DRAIN CHANNEL 19F RND (DRAIN) ×6 IMPLANT
DRAPE LAPAROSCOPIC ABDOMINAL (DRAPES) ×4 IMPLANT
DRAPE LAPAROTOMY 100X72 PEDS (DRAPES) ×2 IMPLANT
DRAPE UTILITY XL STRL (DRAPES) ×4 IMPLANT
DRSG PAD ABDOMINAL 8X10 ST (GAUZE/BANDAGES/DRESSINGS) ×6 IMPLANT
ELECT COATED BLADE 2.86 ST (ELECTRODE) ×4 IMPLANT
ELECT REM PT RETURN 9FT ADLT (ELECTROSURGICAL) ×4
ELECTRODE REM PT RTRN 9FT ADLT (ELECTROSURGICAL) ×2 IMPLANT
EVACUATOR SILICONE 100CC (DRAIN) ×6 IMPLANT
FILTER 7/8 IN (FILTER) ×2 IMPLANT
GAUZE SPONGE 4X4 12PLY STRL (GAUZE/BANDAGES/DRESSINGS) ×4 IMPLANT
GLOVE BIOGEL PI IND STRL 7.0 (GLOVE) IMPLANT
GLOVE BIOGEL PI INDICATOR 7.0 (GLOVE) ×4
GLOVE ECLIPSE 6.5 STRL STRAW (GLOVE) ×2 IMPLANT
GLOVE SURG SIGNA 7.5 PF LTX (GLOVE) ×4 IMPLANT
GOWN STRL REUS W/ TWL LRG LVL3 (GOWN DISPOSABLE) ×4 IMPLANT
GOWN STRL REUS W/TWL LRG LVL3 (GOWN DISPOSABLE) ×8
KIT MARKER MARGIN INK (KITS) IMPLANT
NDL HYPO 25X1 1.5 SAFETY (NEEDLE) ×2 IMPLANT
NDL SAFETY ECLIPSE 18X1.5 (NEEDLE) IMPLANT
NEEDLE HYPO 18GX1.5 SHARP (NEEDLE)
NEEDLE HYPO 25X1 1.5 SAFETY (NEEDLE) IMPLANT
NS IRRIG 1000ML POUR BTL (IV SOLUTION) ×4 IMPLANT
PACK BASIN DAY SURGERY FS (CUSTOM PROCEDURE TRAY) ×4 IMPLANT
PENCIL BUTTON HOLSTER BLD 10FT (ELECTRODE) ×2 IMPLANT
PIN SAFETY STERILE (MISCELLANEOUS) ×6 IMPLANT
SHEET MEDIUM DRAPE 40X70 STRL (DRAPES) ×4 IMPLANT
SLEEVE SCD COMPRESS KNEE MED (MISCELLANEOUS) ×4 IMPLANT
SPONGE GAUZE 4X4 12PLY STER LF (GAUZE/BANDAGES/DRESSINGS) IMPLANT
SPONGE LAP 18X18 X RAY DECT (DISPOSABLE) ×2 IMPLANT
SPONGE LAP 4X18 X RAY DECT (DISPOSABLE) IMPLANT
STRIP CLOSURE SKIN 1/2X4 (GAUZE/BANDAGES/DRESSINGS) IMPLANT
STRIP CLOSURE SKIN 1/4X4 (GAUZE/BANDAGES/DRESSINGS) IMPLANT
SUT ETHILON 2 0 FS 18 (SUTURE) ×4 IMPLANT
SUT MNCRL AB 4-0 PS2 18 (SUTURE) ×6 IMPLANT
SUT SILK 2 0 SH (SUTURE) ×4 IMPLANT
SUT VIC AB 3-0 SH 27 (SUTURE)
SUT VIC AB 3-0 SH 27X BRD (SUTURE) ×2 IMPLANT
SUT VIC AB 5-0 PS2 18 (SUTURE) ×2 IMPLANT
SUT VICRYL 3-0 CR8 SH (SUTURE) ×8 IMPLANT
SYR CONTROL 10ML LL (SYRINGE) ×2 IMPLANT
TOWEL OR 17X24 6PK STRL BLUE (TOWEL DISPOSABLE) ×4 IMPLANT
TOWEL OR NON WOVEN STRL DISP B (DISPOSABLE) ×4 IMPLANT
TUBE CONNECTING 20'X1/4 (TUBING) ×1
TUBE CONNECTING 20X1/4 (TUBING) ×3 IMPLANT
VAC PENCILS W/TUBING CLEAR (MISCELLANEOUS) ×4 IMPLANT
YANKAUER SUCT BULB TIP NO VENT (SUCTIONS) ×4 IMPLANT

## 2016-09-06 NOTE — Anesthesia Procedure Notes (Addendum)
Anesthesia Regional Block:  Pectoralis block  Pre-Anesthetic Checklist: ,, timeout performed, Correct Patient, Correct Site, Correct Laterality, Correct Procedure, Correct Position, site marked, Risks and benefits discussed,  Surgical consent,  Pre-op evaluation,  At surgeon's request and post-op pain management  Laterality: Right  Prep: chloraprep       Needles:  Injection technique: Single-shot  Needle Type: Echogenic Stimulator Needle     Needle Length: 5cm 5 cm Needle Gauge: 21 and 21 G    Additional Needles:  Procedures: ultrasound guided (picture in chart) Pectoralis block Narrative:  Start time: 09/06/2016 9:55 AM End time: 09/06/2016 9:59 AM Injection made incrementally with aspirations every 20 mL.  Performed by: Personally  Anesthesiologist: Reginal Lutes  Additional Notes: 5 ml injected at each of 4 levels.

## 2016-09-06 NOTE — Anesthesia Preprocedure Evaluation (Addendum)
Anesthesia Evaluation  Patient identified by MRN, date of birth, ID band Patient awake    Reviewed: Allergy & Precautions, NPO status , Patient's Chart, lab work & pertinent test results  Airway Mallampati: II  TM Distance: >3 FB Neck ROM: Full    Dental no notable dental hx.    Pulmonary Current Smoker,    Pulmonary exam normal        Cardiovascular hypertension, Pt. on medications and Pt. on home beta blockers Normal cardiovascular exam     Neuro/Psych PSYCHIATRIC DISORDERS Anxiety Depression negative neurological ROS     GI/Hepatic Neg liver ROS, GERD  Medicated and Controlled,  Endo/Other  obesity  Renal/GU negative Renal ROS  negative genitourinary   Musculoskeletal negative musculoskeletal ROS (+)   Abdominal   Peds negative pediatric ROS (+)  Hematology negative hematology ROS (+)   Anesthesia Other Findings   Reproductive/Obstetrics negative OB ROS                              Anesthesia Physical  Anesthesia Plan  ASA: II  Anesthesia Plan: General and Regional   Post-op Pain Management:  Regional for Post-op pain   Induction: Intravenous  Airway Management Planned: LMA  Additional Equipment:   Intra-op Plan:   Post-operative Plan: Extubation in OR  Informed Consent: I have reviewed the patients History and Physical, chart, labs and discussed the procedure including the risks, benefits and alternatives for the proposed anesthesia with the patient or authorized representative who has indicated his/her understanding and acceptance.   Dental advisory given  Plan Discussed with: CRNA  Anesthesia Plan Comments:         Anesthesia Quick Evaluation

## 2016-09-06 NOTE — Progress Notes (Signed)
Assisted Dr.[name] Guidetti with right, ultrasound guided, pectoralis block. Side rails up, monitors on throughout procedure. See vital signs in flow sheet. Tolerated Procedure well.

## 2016-09-06 NOTE — Anesthesia Postprocedure Evaluation (Signed)
Anesthesia Post Note  Patient: Rebecca [name]  Procedure(s) Performed: Procedure(s) (LRB): RIGHT BREAST MASTECTOMY WITH RIGHT AXILLARY SENTINEL LYMPH NODE BIOPSY (Right) REMOVAL PORT-A-CATH (Left)  Patient location during evaluation: PACU Anesthesia Type: General Level of consciousness: awake and alert Pain management: pain level controlled Vital Signs Assessment: post-procedure vital signs reviewed and stable Respiratory status: spontaneous breathing, nonlabored ventilation, respiratory function stable and patient connected to nasal cannula oxygen Cardiovascular status: blood pressure returned to baseline and stable Postop Assessment: no signs of nausea or vomiting Anesthetic complications: no    Last Vitals:  Vitals:   09/06/16 1430 09/06/16 1445  BP: 121/87 109/76  Pulse: 66 74  Resp: 10 16  Temp:  36.7 C    Last Pain:  Vitals:   09/06/16 1445  PainSc: 3                  Tyann Niehaus Minda Meo

## 2016-09-06 NOTE — Interval H&P Note (Signed)
History and Physical Interval Note:  09/06/2016 10:12 AM  Rebecca [name]  has presented today for surgery, with the diagnosis of right breast cancer  The various methods of treatment have been discussed with the patient and family.  Family at bedside, though her mother is at [facility] with blood clots.  She wants to go ahead with surgery.  After consideration of risks, benefits and other options for treatment, the patient has consented to  Procedure(s): RIGHT BREAST MASTECTOMY WITH RIGHT AXILLARY SENTINEL LYMPH NODE BIOPSY (Right) REMOVAL PORT-A-CATH (N/A) as a surgical intervention .  The patient's history has been reviewed, patient examined, no change in status, stable for surgery.  I have reviewed the patient's chart and labs.  Questions were answered to the patient's satisfaction.     Sandra Brents H

## 2016-09-06 NOTE — Anesthesia Procedure Notes (Signed)
Procedure Name: LMA Insertion Date/Time: 09/06/2016 10:34 AM Performed by: Bufford Spikes Pre-anesthesia Checklist: Patient identified, Timeout performed, Emergency Drugs available, Suction available and Patient being monitored Patient Re-evaluated:Patient Re-evaluated prior to inductionOxygen Delivery Method: Circle system utilized Preoxygenation: Pre-oxygenation with 100% oxygen Intubation Type: IV induction Ventilation: Mask ventilation without difficulty LMA: LMA inserted LMA Size: 4.0 Tube type: Oral Number of attempts: 1 Placement Confirmation: ETT inserted through vocal cords under direct vision,  breath sounds checked- equal and bilateral and positive ETCO2 Tube secured with: Tape Dental Injury: Teeth and Oropharynx as per pre-operative assessment

## 2016-09-06 NOTE — Op Note (Signed)
09/06/2016  12:29 PM  PATIENT:  Rebecca [name], 40 y.o., female, MRN: [id number]  PREOP DIAGNOSIS:  right breast cancer  POSTOP DIAGNOSIS:   right breast cancer, 11:30 o'clock position (T2, N0)  PROCEDURE:   Procedure(s): RIGHT BREAST MASTECTOMY WITH RIGHT AXILLARY SENTINEL LYMPH NODE BIOPSY, REMOVAL PORT-A-CATH  SURGEON:   Alphonsa Overall, [name].D.  ASSISTANT:   None  ANESTHESIA:   general  Anesthesiologist: Reginal Lutes, MD CRNA: Marrianne Mood, CRNA; Bufford Spikes, CRNA  General  ASA:  2  EBL:  200  ml  BLOOD ADMINISTERED: none  DRAINS: none   LOCAL MEDICATIONS USED:   She had a right pectoral block  SPECIMEN:   Right breast, right axillary node (counts 2200, background 20), superior skin flap (long suture inferior, short suture lateral)  COUNTS CORRECT:  YES  INDICATIONS FOR PROCEDURE:  Rebecca [name] is a 40 y.o. (DOB: 10-Sep-1976) AA female whose primary care physician is Rebecca M, FNP and comes for right mastectomy, right axillary sentinel lymph node biopsy, and power port removal.   Rebecca [name] was originally seen at [facility] in March, 2017.  She has a triple negative right breast cancer.  She had chemotherapy supervised by Dr. [name].  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  But follow up MRI of her breast showed the tumor had decreased in size.  We talked about lumpectomy vs mastectomy.   The patient decided on a mastectomy without reconstruction.  I will remove her power port at the same time.   The indications and risks of the surgery were explained to the patient.  The risks include, but are not limited to, infection, bleeding, and nerve injury.  OPERATIVE NOTE;  The patient was taken to room # 8 at [facility] where she underwent a general anesthesia  supervised by Anesthesiologist: Reginal Lutes, MD CRNA: Marrianne Mood, CRNA; Bufford Spikes, CRNA. Her right breast and axilla were prepped with ChloraPrep and  sterilely draped.    A time-out and the surgical check list was reviewed.    I made an elliptical incision including the areola in the right breast.  I developed skin flaps medially to the lateral edge of the sternum, inferiorly to the investing fascia of the rectus abdominus muscle, laterally to the anterior edge of the latissimus dorsi muscle, and superiorly to about 2 finger breaths below the clavicle.  The breast was reflected off the pectoralis muscle from medial to lateral.  The lateral attachments in the right axilla were divided and the breast removed.  A long suture was placed on the lateral aspect of the breast.   I dissected into the right axilla and found a sentinel lymph node.  The node had counts of 2200 with a background count of 10.  This was made through the mastectomy incision.  Because I was able to locate a sentinel lymph node,I did not inject methylene blue dye for this procedure.   I brought out 2 25 F Blake drains below the inferior flaps.  These were sewn in place with 2-0 Nylons.  I irrigated the wound with 1,500 cc of fluid.   I excised some excess superior skin.  I marked the skin with a long suture inferiorly and a short suture laterally.   The skin was closed with interrupted 3-0 Vicryl sutures and the skin was closed with a 4-0 Monocryl.  The  Wound was painted with Dermabond.    I also  removed the power port in the upper inner left chest.  The skin was incised.  I cut down on reservoir and removed the entire port intact.  I closed the subcutaneous tissue with 3-0 vicryl and the skin with 4-0 Monocryl.   A pressure dressing was placed on the wound and the chest wrapped with a breast binder.  Her needle and sponge count were correct at the end of the case.   She was transferred to the recovery room in good condition.  Alphonsa Overall, MD, [facility] Surgery Pager: [phone] Office phone:  [phone]

## 2016-09-06 NOTE — Transfer of Care (Signed)
Immediate Anesthesia Transfer of Care Note  Patient: Rebecca [name]  Procedure(s) Performed: Procedure(s): RIGHT BREAST MASTECTOMY WITH RIGHT AXILLARY SENTINEL LYMPH NODE BIOPSY (Right) REMOVAL PORT-A-CATH (Left)  Patient Location: PACU  Anesthesia Type:General  Level of Consciousness: awake, alert  and oriented  Airway & Oxygen Therapy: Patient Spontanous Breathing and Patient connected to face mask oxygen  Post-op Assessment: Report given to RN and Post -op Vital signs reviewed and stable  Post vital signs: Reviewed and stable  Last Vitals:  Vitals:   09/06/16 1244 09/06/16 1245  BP:  114/86  Pulse: 80 86  Resp: 15 16  Temp:      Last Pain: There were no vitals filed for this visit.       Complications: No apparent anesthesia complications

## 2016-09-07 ENCOUNTER — Encounter (HOSPITAL_BASED_OUTPATIENT_CLINIC_OR_DEPARTMENT_OTHER): Payer: Self-pay | Admitting: Surgery

## 2016-09-07 DIAGNOSIS — C50411 Malignant neoplasm of upper-outer quadrant of right female breast: Secondary | ICD-10-CM | POA: Diagnosis not present

## 2016-09-07 MED ORDER — HYDROCODONE-ACETAMINOPHEN 5-325 MG PO TABS: 1.0000 | ORAL_TABLET | Freq: Four times a day (QID) | ORAL | 0 refills | Status: DC | PRN

## 2016-09-07 MED ORDER — HEPARIN SODIUM (PORCINE) 5000 UNIT/ML IJ SOLN
INTRAMUSCULAR | Status: AC
  Filled 2016-09-07: qty 1

## 2016-09-07 MED ORDER — LORAZEPAM 0.5 MG PO TABS: 0.5000 mg | ORAL_TABLET | Freq: Every day | ORAL | 0 refills | Status: DC

## 2016-09-07 NOTE — Discharge Instructions (Signed)
[facility] - DISCHARGE INSTRUCTIONS [name]  Activity:  Driving - may drive in 3 or 4 days, if off pain meds   Lifting - No lifting more than 15 pounds for 7 days, then no limit.  But use your right arm as you would normally.  Wound Care:   Leave incision dry until Friday, then may remove bandage and shower.        Empty the drains twice a day and record the amount of the drainage.  Diet:  As tolerated  Follow up appointment:  Call Dr. Pollie Friar office [facility]) at [phone] for an appointment in 1 week.  Medications and dosages:  Resume your home medications.  You have a prescription for:  Vicdoin and Ativan  Call Dr. [name] or his office  [phone]) if you have:  Temperature greater than 100.4,  Persistent nausea and vomiting,  Severe uncontrolled pain,  Redness, tenderness, or signs of infection (pain, swelling, redness, odor or green/yellow discharge around the site),  Difficulty breathing, headache or visual disturbances,  Any other questions or concerns you may have after discharge.  In an emergency, call 911 or go to an Emergency Department at a nearby hospital.      About my Jackson-Pratt Bulb Drain  What is a Jackson-Pratt bulb? A Jackson-Pratt is a soft, round device used to collect drainage. It is connected to a long, thin drainage catheter, which is held in place by one or two small stiches near your surgical incision site. When the bulb is squeezed, it forms a vacuum, forcing the drainage to empty into the bulb.  Emptying the Jackson-Pratt bulb- To empty the bulb: 1. Release the plug on the top of the bulb. 2. Pour the bulb's contents into a measuring container which your nurse will provide. 3. Record the time emptied and amount of drainage. Empty the drain(s) as often as your     doctor or nurse recommends.  Date                  Time                    Amount (Drain 1)                 Amount (Drain  2)  _____________________________________________________________________  _____________________________________________________________________  _____________________________________________________________________  _____________________________________________________________________  _____________________________________________________________________  _____________________________________________________________________  _____________________________________________________________________  _____________________________________________________________________  Squeezing the Jackson-Pratt Bulb- To squeeze the bulb: 1. Make sure the plug at the top of the bulb is open. 2. Squeeze the bulb tightly in your fist. You will hear air squeezing from the bulb. 3. Replace the plug while the bulb is squeezed. 4. Use a safety pin to attach the bulb to your clothing. This will keep the catheter from     pulling at the bulb insertion site.  When to call your doctor- Call your doctor if:  Drain site becomes red, swollen or hot.  You have a fever greater than 101 degrees F.  There is oozing at the drain site.  Drain falls out (apply a guaze bandage over the drain hole and secure it with tape).  Drainage increases daily not related to activity patterns. (You will usually have more drainage when you are active than when you are resting.)  Drainage has a bad odor.     Post Anesthesia Home Care Instructions  Activity: Get plenty of rest for the remainder of the day. A responsible adult should stay with you for 24 hours  following the procedure.  For the next 24 hours, DO NOT: -Drive a car -Paediatric nurse -Drink alcoholic beverages -Take any medication unless instructed by your physician -Make any legal decisions or sign important papers.  Meals: Start with liquid foods such as gelatin or soup. Progress to regular foods as tolerated. Avoid greasy, spicy, heavy foods. If nausea  and/or vomiting occur, drink only clear liquids until the nausea and/or vomiting subsides. Call your physician if vomiting continues.  Special Instructions/Symptoms: Your throat may feel dry or sore from the anesthesia or the breathing tube placed in your throat during surgery. If this causes discomfort, gargle with warm salt water. The discomfort should disappear within 24 hours.  If you had a scopolamine patch placed behind your ear for the management of post- operative nausea and/or vomiting:  1. The medication in the patch is effective for 72 hours, after which it should be removed.  Wrap patch in a tissue and discard in the trash. Wash hands thoroughly with soap and water. 2. You may remove the patch earlier than 72 hours if you experience unpleasant side effects which may include dry mouth, dizziness or visual disturbances. 3. Avoid touching the patch. Wash your hands with soap and water after contact with the patch.

## 2016-09-07 NOTE — Addendum Note (Signed)
Addendum  created 09/07/16 1238 by Ernesta Amble Hero Kulish, CRNA   Charge Capture section accepted

## 2016-09-07 NOTE — Discharge Summary (Signed)
Physician Discharge Summary  Patient ID:  Rebecca [name]  MRN: [id number]  DOB/AGE: 04/04/1976 40 y.o.  Admit date: 09/06/2016 Discharge date: 09/07/2016  Discharge Diagnoses:  1.  BREAST CANCER, STAGE 1, RIGHT (C50.911)             Story: She underwent a biopsy on 01/28/2016 ([id number]) which showed grade 3 IDC, ER - 0%, PR - 0%, Ki67 - 70%, Her2Neu - neg. Right axillary lymph node biopsy was benign.            Completed neoadjuvant chemotx             Oncology - Drs. Magrinat and Kinard             Final pathology pending 2.  Smokes 3.  Sickle cell trait - She [facility] by Dr. [name] 4.  Power port placed by IR on 04/11/2016   Active Problems:   Breast cancer, right breast ([place])  Operation: Procedure(s): RIGHT BREAST MASTECTOMY WITH RIGHT AXILLARY SENTINEL LYMPH NODE BIOPSY, REMOVAL PORT-A-CATH on 09/06/2016 - D. [name]  Discharged Condition: good  Hospital Course: Rebecca [name] is an 40 y.o. female whose primary care physician is Hollis,Lachina M, FNP and who was admitted 09/06/2016 with a chief complaint of right breast cancer.  She has completed neoadjuvant chemotherapy supervised by Dr. Darnell Level. Magrinat.  We discussed further surgical therapy of her breast cancer and she has elected to have a right mastectomy without reconstruction.  She was brought to the operating room on 09/06/2016 and underwent  RIGHT BREAST MASTECTOMY WITH RIGHT AXILLARY SENTINEL LYMPH NODE BIOPSY, REMOVAL PORT-A-CATH.   She was kept overnight for observation.  She has done well and is ready to go home. The discharge instructions were reviewed with the patient.  Consults: None  Significant Diagnostic Studies: Results for orders placed or performed in visit on 08/11/16  CBC with Differential  Result Value Ref Range   WBC 7.0 3.9 - 10.3 10e3/uL   NEUT# 5.0 1.5 - 6.5 10e3/uL   HGB 10.5 (L) 11.6 - 15.9 g/dL   HCT 31.4 (L) 34.8 - 46.6 %   Platelets 138 (L) 145 - 400 10e3/uL   MCV 100.7 79.5  - 101.0 fL   MCH 33.8 25.1 - 34.0 pg   MCHC 33.5 31.5 - 36.0 g/dL   RBC 3.12 (L) 3.70 - 5.45 10e6/uL   RDW 16.8 (H) 11.2 - 14.5 %   lymph# 1.4 0.9 - 3.3 10e3/uL   MONO# 0.5 0.1 - 0.9 10e3/uL   Eosinophils Absolute 0.1 0.0 - 0.5 10e3/uL   Basophils Absolute 0.0 0.0 - 0.1 10e3/uL   NEUT% 71.3 38.4 - 76.8 %   LYMPH% 19.5 14.0 - 49.7 %   MONO% 7.8 0.0 - 14.0 %   EOS% 1.2 0.0 - 7.0 %   BASO% 0.2 0.0 - 2.0 %  Comprehensive metabolic panel  Result Value Ref Range   Sodium 143 136 - 145 mEq/L   Potassium 3.8 3.5 - 5.1 mEq/L   Chloride 108 98 - 109 mEq/L   CO2 24 22 - 29 mEq/L   Glucose 104 70 - 140 mg/dl   BUN 6.3 (L) 7.0 - 26.0 mg/dL   Creatinine 0.8 0.6 - 1.1 mg/dL   Total Bilirubin 0.30 0.20 - 1.20 mg/dL   Alkaline Phosphatase 67 40 - 150 U/L   AST 17 5 - 34 U/L   ALT 13 0 - 55 U/L   Total Protein 8.3 6.4 - 8.3  g/dL   Albumin 3.9 3.5 - 5.0 g/dL   Calcium 9.8 8.4 - 10.4 mg/dL   Anion Gap 11 3 - 11 mEq/L   EGFR >90 >90 ml/min/1.73 m2    Mr Breast Bilateral W Wo Contrast  Result Date: 08/10/2016 CLINICAL DATA:  Neoadjuvant treatment for known right breast cancer. Patient has completed therapy. Preop. Family history of breast cancer in patient's grandmother and uncle and several other relatives. LABS:  Not applicable EXAM: BILATERAL BREAST MRI WITH AND WITHOUT CONTRAST TECHNIQUE: Multiplanar, multisequence MR images of both breasts were obtained prior to and following the intravenous administration of 17 ml of MultiHance. THREE-DIMENSIONAL MR IMAGE RENDERING ON INDEPENDENT WORKSTATION: Three-dimensional MR images were rendered by post-processing of the original MR data on an independent workstation. The three-dimensional MR images were interpreted, and findings are reported in the following complete MRI report for this study. Three dimensional images were evaluated at the independent DynaCad workstation COMPARISON:  04/26/2016 FINDINGS: Breast composition: b. Scattered fibroglandular  tissue. Background parenchymal enhancement: Moderate. Right breast: Within the central portion of the right breast there is an enhancing mass which measures 1.4 x 1.8 x 1.9 cm. Mass is spiculated and demonstrates rapid wash-in and washout type kinetics. Previously this mass measured 3.0 x 2.7 x 3.0 cm. Signal void artifact is identified within the mass consistent with prior biopsy clip placement. Left breast: No mass or abnormal enhancement. Lymph nodes: No abnormal appearing lymph nodes. Ancillary findings:  Left-sided Port-A-Cath. IMPRESSION: Smaller mass within the central portion of the right breast following neoadjuvant treatment. Left breast is negative for malignancy. RECOMMENDATION: Treatment plan is recommended. BI-RADS CATEGORY  6: Known biopsy-proven malignancy. Electronically Signed   By: Nolon Nations M.D.   On: 08/10/2016 12:27   Nm Sentinel Node Inj-no Rpt (breast)  Result Date: 09/06/2016 CLINICAL DATA: right breast cacner Sulfur colloid was injected intradermally by the nuclear medicine technologist for breast cancer sentinel node localization.    Discharge Exam:  Vitals:   09/07/16 0002 09/07/16 0704  BP: 110/75 102/70  Pulse: 79 81  Resp: 16 16  Temp: 97.3 F (36.3 C) 97.7 F (36.5 C)    General: WN AA F who is alert and generally healthy appearing.  Lungs: Clear to auscultation and symmetric breath sounds. Chest/Breast:  Incision okay.  Drainage from right mastectomy - 1/2 - 37/47 cc  Discharge Medications:     Medication List    TAKE these medications   carvedilol 3.125 MG tablet Commonly known as:  COREG Take 1 tablet (3.125 mg total) by mouth 2 (two) times daily with a meal.   HYDROcodone-acetaminophen 5-325 MG tablet Commonly known as:  NORCO/VICODIN Take 1-2 tablets by mouth every 6 (six) hours as needed for moderate pain.   ketoconazole 2 % cream Commonly known as:  NIZORAL Apply 1 application topically daily.   LORazepam 0.5 MG tablet Commonly  known as:  ATIVAN Take 1 tablet (0.5 mg total) by mouth at bedtime. What changed:  Another medication with the same name was added. Make sure you understand how and when to take each.   LORazepam 0.5 MG tablet Commonly known as:  ATIVAN Take 1 tablet (0.5 mg total) by mouth at bedtime. What changed:  You were already taking a medication with the same name, and this prescription was added. Make sure you understand how and when to take each.   losartan 25 MG tablet Commonly known as:  COZAAR Take 0.5 tablets (12.5 mg total) by mouth daily.  omeprazole 20 MG capsule Commonly known as:  PRILOSEC Take 1 capsule (20 mg total) by mouth daily.   potassium chloride 10 MEQ CR capsule Commonly known as:  MICRO-K Take 1 capsule (10 mEq total) by mouth daily.   venlafaxine XR 75 MG 24 hr capsule Commonly known as:  EFFEXOR-XR Take 1 capsule (75 mg total) by mouth daily with breakfast.       Disposition: 01-Home or Self Care  Discharge Instructions    Diet - low sodium heart healthy    Complete by:  As directed    Increase activity slowly    Complete by:  As directed       Activity:  Driving - may drive in 3 or 4 days, if off pain meds   Lifting - No lifting more than 15 pounds for 7 days, then no limit.  But use your right arm as you would normally.  Wound Care:   Leave incision dry until Friday, then may remove bandage and shower.        Empty the drains twice a day and record the amount of the drainage.  Diet:  As tolerated  Follow up appointment:  Call Dr. Pollie Friar office [facility]) at [phone] for an appointment in 1 week.  Medications and dosages:  Resume your home medications.  You have a prescription for:  Vicdoin and Ativan    Signed: Alphonsa Overall, M.D., [facility] Office:  [phone]  09/07/2016, 7:08 AM

## 2016-09-08 ENCOUNTER — Ambulatory Visit: Payer: Medicaid Other | Admitting: Oncology

## 2016-09-08 ENCOUNTER — Other Ambulatory Visit: Payer: Medicaid Other

## 2016-09-08 ENCOUNTER — Encounter: Payer: Medicaid Other | Admitting: Genetic Counselor

## 2016-09-09 NOTE — Addendum Note (Signed)
Addendum  created 09/09/16 1524 by Reginal Lutes, MD   Anesthesia Intra Blocks edited, Sign clinical note

## 2016-09-23 ENCOUNTER — Ambulatory Visit: Payer: Medicaid Other | Admitting: Oncology

## 2016-09-27 ENCOUNTER — Encounter: Payer: Self-pay | Admitting: *Deleted

## 2016-09-28 ENCOUNTER — Encounter: Payer: Self-pay | Admitting: *Deleted

## 2016-09-29 ENCOUNTER — Ambulatory Visit: Payer: Medicaid Other

## 2016-09-29 ENCOUNTER — Ambulatory Visit: Payer: Medicaid Other | Admitting: Radiation Oncology

## 2016-10-04 ENCOUNTER — Encounter: Payer: Self-pay | Admitting: *Deleted

## 2016-10-04 ENCOUNTER — Ambulatory Visit (HOSPITAL_BASED_OUTPATIENT_CLINIC_OR_DEPARTMENT_OTHER): Payer: Medicaid Other | Admitting: Genetic Counselor

## 2016-10-04 ENCOUNTER — Ambulatory Visit (HOSPITAL_BASED_OUTPATIENT_CLINIC_OR_DEPARTMENT_OTHER): Payer: Medicaid Other | Admitting: Oncology

## 2016-10-04 ENCOUNTER — Other Ambulatory Visit (HOSPITAL_BASED_OUTPATIENT_CLINIC_OR_DEPARTMENT_OTHER): Payer: Medicaid Other

## 2016-10-04 VITALS — BP 110/74 | HR 99 | Temp 97.7°F | Resp 18 | Ht 61.5 in | Wt 179.2 lb

## 2016-10-04 DIAGNOSIS — Z800 Family history of malignant neoplasm of digestive organs: Secondary | ICD-10-CM

## 2016-10-04 DIAGNOSIS — C50411 Malignant neoplasm of upper-outer quadrant of right female breast: Secondary | ICD-10-CM | POA: Diagnosis not present

## 2016-10-04 DIAGNOSIS — Z9011 Acquired absence of right breast and nipple: Secondary | ICD-10-CM | POA: Diagnosis not present

## 2016-10-04 DIAGNOSIS — Z808 Family history of malignant neoplasm of other organs or systems: Secondary | ICD-10-CM

## 2016-10-04 DIAGNOSIS — Z809 Family history of malignant neoplasm, unspecified: Secondary | ICD-10-CM

## 2016-10-04 DIAGNOSIS — Z171 Estrogen receptor negative status [ER-]: Secondary | ICD-10-CM | POA: Diagnosis not present

## 2016-10-04 DIAGNOSIS — Z803 Family history of malignant neoplasm of breast: Secondary | ICD-10-CM | POA: Diagnosis not present

## 2016-10-04 DIAGNOSIS — Z315 Encounter for genetic counseling: Secondary | ICD-10-CM

## 2016-10-04 DIAGNOSIS — C50919 Malignant neoplasm of unspecified site of unspecified female breast: Secondary | ICD-10-CM

## 2016-10-04 LAB — COMPREHENSIVE METABOLIC PANEL
ALT: 10 U/L (ref 0–55)
ANION GAP: 10 meq/L (ref 3–11)
AST: 11 U/L (ref 5–34)
Albumin: 3.5 g/dL (ref 3.5–5.0)
Alkaline Phosphatase: 72 U/L (ref 40–150)
BUN: 11.4 mg/dL (ref 7.0–26.0)
CO2: 26 meq/L (ref 22–29)
CREATININE: 0.8 mg/dL (ref 0.6–1.1)
Calcium: 9.7 mg/dL (ref 8.4–10.4)
Chloride: 107 mEq/L (ref 98–109)
EGFR: >90 mL/min/{1.73_m2} (ref >90)
GLUCOSE: 100 mg/dL (ref 70–140)
Potassium: 3.8 mEq/L (ref 3.5–5.1)
SODIUM: 143 meq/L (ref 136–145)
TOTAL PROTEIN: 8 g/dL (ref 6.4–8.3)

## 2016-10-04 LAB — CBC WITH DIFFERENTIAL/PLATELET
BASO%: 0.3 % (ref 0.0–2.0)
Basophils Absolute: 0 10*3/uL (ref 0.0–0.1)
EOS%: 2.3 % (ref 0.0–7.0)
Eosinophils Absolute: 0.3 10*3/uL (ref 0.0–0.5)
HCT: 31.3 % — ABNORMAL LOW (ref 34.8–46.6)
HEMOGLOBIN: 10.4 g/dL — AB (ref 11.6–15.9)
LYMPH#: 1.7 10*3/uL (ref 0.9–3.3)
LYMPH%: 15.4 % (ref 14.0–49.7)
MCH: 31.7 pg (ref 25.1–34.0)
MCHC: 33.1 g/dL (ref 31.5–36.0)
MCV: 95.7 fL (ref 79.5–101.0)
MONO#: 0.5 10*3/uL (ref 0.1–0.9)
MONO%: 4.7 % (ref 0.0–14.0)
NEUT%: 77.3 % — ABNORMAL HIGH (ref 38.4–76.8)
NEUTROS ABS: 8.6 10*3/uL — AB (ref 1.5–6.5)
PLATELETS: 320 10*3/uL (ref 145–400)
RBC: 3.27 10*6/uL — AB (ref 3.70–5.45)
RDW: 16.3 % — AB (ref 11.2–14.5)
WBC: 11.2 10*3/uL — AB (ref 3.9–10.3)

## 2016-10-04 MED ORDER — CAPECITABINE 500 MG PO TABS: 1000.0000 mg | ORAL_TABLET | Freq: Two times a day (BID) | ORAL | 1 refills | Status: DC

## 2016-10-04 MED ORDER — TRAMADOL HCL 50 MG PO TABS: 50.0000 mg | ORAL_TABLET | Freq: Four times a day (QID) | ORAL | 2 refills | Status: DC | PRN

## 2016-10-04 MED ORDER — LORAZEPAM 0.5 MG PO TABS: 0.5000 mg | ORAL_TABLET | Freq: Every day | ORAL | 0 refills | Status: DC

## 2016-10-04 NOTE — Progress Notes (Signed)
[place]  [phone] Fax:([phone]) [phone]   ID: [name] DOB: 1975-12-06  MR#: [id number]  PVV#:[id number]  Patient Care Team: Dorena Dew, FNP as PCP - General (Family Medicine) Alphonsa Overall, MD as Consulting Physician (General Surgery) Chauncey Cruel, MD as Consulting Physician (Oncology) Gery Pray, MD as Consulting Physician (Radiation Oncology) Sylvan Cheese, NP as Nurse Practitioner (Hematology and Oncology) Benson Norway, RN as Registered Nurse PCP: Dorena Dew, FNP GYN: OTHER MD:  CHIEF COMPLAINT: triple negative breast cancer  CURRENT TREATMENT: Adjuvant radiation pending  BREAST CANCER HISTORY: From the original intake note:  Aydee herself noted a change in her right breast sometime around September or October 2016. She did not bring it to intermediate medical attention, but on 01/13/2016 she established herself in Dr. Smith Robert' service and she was set up for bilateral diagnostic mammography with tomosynthesis and bilateral ultrasonography at the [facility] 01/19/2016. The breast density was category B. In the upper outer quadrant of the right breast there was a spiculated mass measuring 2.8 cm. On physical exam this was palpable. Targeted ultrasonography confirmed an irregular hypoechoic mass in the right breast 11:30 o'clock position measuring 2.6 cm maximally. Ultrasound of the right axilla showed a morphologically abnormal lymph node.  In the left breast there were some tubular densities behind the areola which by ultrasonography showed benign ductal ectasia.  On 01/28/2016 C[place] underwent biopsy of the right breast mass and abnormal right axillary lymph node. The pathology from this procedure (S [id number]) showed the lymph node to be benign. In the breast however there was an invasive ductal carcinoma, grade 3, which was estrogen and progesterone receptor negative. The proliferation marker was 70%. HER-2 was not  amplified with a signals ratio of 1.32. The number per cell was 2.05.  The patient's subsequent history is as detailed below  INTERVAL HISTORY: [name] returns today for follow-up of her triple negative breast cancer. Since her last visit here she underwent right mastectomy and sentinel lymph node sampling, on 09/06/2016. The final pathology ([id number]) showed invasive ductal carcinoma, 2.0 cm residual, with negative margins, and both sentinel lymph nodes clear. Repeat estrogen and progesterone studies were again negative as was the repeat HER-2, with a signals ratio of 1.06 and the number per cell 1.65.  Her case was presented at the multidisciplinary breast cancer conference 09/28/2016. At that time it was felt that she would benefit from Xeloda post op, with radiation, and also that she may want to participate in the SWOG immunotherapy trial as well as B-Well  REVIEW OF SYSTEMS: [name] did well with her surgery although she still has pain. She has run out of narcotics and is currently using ibuprofen. She has tingling feelings in her fingers and toes. Sometimes her leg "goes to sleep". She has forgetfulness and anxiety. She denies depression. Sometimes she feels weak. She has some heartburn problems and she says her appetite is poor. She can be short of breath when walking. She complains of hearing loss and some sinus problems including a runny nose. Aside from these issues a detailed review of systems today was stable  PAST MEDICAL HISTORY: Past Medical History:  Diagnosis Date  . Anxiety   . Breast cancer ([place])   . Depression   . GERD (gastroesophageal reflux disease)   . Hypertension    pt states is currently on no medications   . Obesity (BMI 35.0-39.9 without comorbidity) ([place])   . Seasonal allergies   . Sickle  cell trait ([place])   . Termination of pregnancy (fetus) 04/02/16    PAST SURGICAL HISTORY: Past Surgical History:  Procedure Laterality Date  . CESAREAN SECTION     2004  and 2007    FAMILY HISTORY Family History  Problem Relation Age of Onset  . Hypertension Mother   . Uterine cancer Mother   . Breast cancer Cousin   . Cancer Father   . Hypertension Father   . Breast cancer Maternal Aunt   . Prostate cancer Maternal Uncle   . Breast cancer Maternal Grandmother   . Throat cancer Maternal Grandfather   The patient has very little information about her father. Her mother is currently 28 years old. She had a history of cervical cancer at age 74. The patient had 2 brothers, no sisters. The maternal grandfather had throat cancer. A maternal uncle was diagnosed with breast cancer as well as prostate cancer at the age of 40. 2 maternal cousins, one of the mail, had breast cancer as well.  GYNECOLOGIC HISTORY:  No LMP recorded. Menarche age 23, first live birth age 61. The patient is GX P4. She still having regular periods. She took oral contraceptives in the 1990s with no side effects.  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 The patient's significant other [name] works at [employer]. In addition to them the patient's 3 children Chasmine Bourbonnais, [place] and [name] are also in the home. There are age 15, 4, and 22 as of August 2017. The patient's son [name], currently 61 years old, lives in [place].    ADVANCED DIRECTIVES: Not in place   HEALTH MAINTENANCE: Social History  Substance Use Topics  . Smoking status: Current Every Day Smoker    Packs/day: 0.25    Years: 20.00    Types: Cigarettes  . Smokeless tobacco: Never Used  . Alcohol use Yes     Comment: occ     Colonoscopy:  PAP:  Bone density:  Lipid panel:  No Known Allergies  Current Outpatient Prescriptions  Medication Sig Dispense Refill  . ketoconazole (NIZORAL) 2 % cream Apply 1 application topically daily. 15 g 0  . LORazepam (ATIVAN) 0.5 MG tablet Take 1 tablet (0.5 mg total) by  mouth at bedtime. 30 tablet 0  . omeprazole (PRILOSEC) 20 MG capsule Take 1 capsule (20 mg total) by mouth daily. 30 capsule 3  . potassium chloride (MICRO-K) 10 MEQ CR capsule Take 1 capsule (10 mEq total) by mouth daily. 30 capsule 3  . venlafaxine XR (EFFEXOR-XR) 75 MG 24 hr capsule Take 1 capsule (75 mg total) by mouth daily with breakfast. 30 capsule 6   No current facility-administered medications for this visit.    OBJECTIVE: [name] In no acute distress  Vitals:   10/04/16 1208  BP: 110/74  Pulse: 99  Resp: 18  Temp: 97.7 F (36.5 C)   Body mass index is 33.31 kg/m.   ECOG FS:1 - Symptomatic but completely ambulatory  Sclerae unicteric, pupils round and equal Oropharynx clear and moist-- no thrush or other lesions No cervical or supraclavicular adenopathy Lungs no rales or rhonchi Heart regular rate and rhythm Abd soft, nontender, positive bowel sounds, no masses palpated MSK no focal spinal tenderness, no upper extremity lymphedema Neuro: nonfocal, well oriented, appropriate affect Breasts: Deferred     LAB RESULTS:  CMP     Component Value  Date/Time   NA 142 07/21/2016 0826   K 3.1 (L) 07/21/2016 0826   CL 106 04/11/2016 1045   CO2 23 07/21/2016 0826   GLUCOSE 124 07/21/2016 0826   BUN 8.8 07/21/2016 0826   CREATININE 0.8 07/21/2016 0826   CALCIUM 9.5 07/21/2016 0826   PROT 7.5 07/21/2016 0826   ALBUMIN 3.9 07/21/2016 0826   AST 17 07/21/2016 0826   ALT 18 07/21/2016 0826   ALKPHOS 57 07/21/2016 0826   BILITOT 0.35 07/21/2016 0826   GFRNONAA >60 04/11/2016 1045   GFRNONAA >89 01/12/2016 1115   GFRAA >60 04/11/2016 1045   GFRAA >89 01/12/2016 1115    INo results found for: SPEP, UPEP  Lab Results  Component Value Date   WBC 4.2 07/21/2016   NEUTROABS 3.0 07/21/2016   HGB 9.3 (L) 07/21/2016   HCT 27.8 (L) 07/21/2016   MCV 99.3 07/21/2016   PLT 137 (L) 07/21/2016      Chemistry      Component Value Date/Time   NA 142  07/21/2016 0826   K 3.1 (L) 07/21/2016 0826   CL 106 04/11/2016 1045   CO2 23 07/21/2016 0826   BUN 8.8 07/21/2016 0826   CREATININE 0.8 07/21/2016 0826      Component Value Date/Time   CALCIUM 9.5 07/21/2016 0826   ALKPHOS 57 07/21/2016 0826   AST 17 07/21/2016 0826   ALT 18 07/21/2016 0826   BILITOT 0.35 07/21/2016 0826       No results found for: LABCA2  No components found for: LABCA125  No results for input(s): INR in the last 168 hours.  Urinalysis    Component Value Date/Time   LABSPEC 1.020 01/12/2016 1122   PHURINE 5.5 01/12/2016 1122   GLUCOSEU NEGATIVE 01/12/2016 1122   HGBUR TRACE (A) 01/12/2016 1122   BILIRUBINUR NEGATIVE 01/12/2016 1122   KETONESUR NEGATIVE 01/12/2016 1122   PROTEINUR NEGATIVE 01/12/2016 1122   UROBILINOGEN 0.2 01/12/2016 1122   NITRITE NEGATIVE 01/12/2016 1122   LEUKOCYTESUR NEGATIVE 01/12/2016 1122    ELIGIBLE FOR AVAILABLE RESEARCH PROTOCOL: ASA, B-WELL  STUDIES: ------------------------------------------------------------------- Transthoracic Echocardiography  Patient:    Darnelle, Derrick MR #:       [id number] Study Date: 08/03/2016 Gender:     F Age:        3 Height:     156.2 cm Weight:     81.2 kg BSA:        1.91 m^2 Pt. Status: Room:   ATTENDING    Loralie Champagne, M.D.  ORDERING     Loralie Champagne, M.D.  SONOGRAPHER  [facility]  PERFORMING   [facility], Outpatient  REFERRING    Cammie Sickle M  cc:  ------------------------------------------------------------------- LV EF: 50%  CLINICAL DATA:  40 year old female with biopsy proven invasive ductal carcinoma in the right breast. Patient's MRI was performed on 04/26/2016. Multiple attempts were made to contact the patient for repeat imaging secondary to motion. The patient never returned phone calls for repeat imaging. Leo Rod, nurse practitioner working with Dr. [name] was contacted about the repeat imaging, she states that the patient is having  another MRI in 3 weeks following neoadjuvant chemotherapy. The patient will not return for repeat imaging until that time.  LABS:  None obtained at the time of imaging.  EXAM: BILATERAL BREAST MRI WITH AND WITHOUT CONTRAST  TECHNIQUE: Multiplanar, multisequence MR images of both breasts were obtained prior to and following the intravenous administration of 16 ml of MultiHance.  THREE-DIMENSIONAL MR IMAGE RENDERING ON  INDEPENDENT WORKSTATION:  Three-dimensional MR images were rendered by post-processing of the original MR data on an independent workstation. The three-dimensional MR images were interpreted, and findings are reported in the following complete MRI report for this study. Three dimensional images were evaluated at the independent DynaCad workstation  COMPARISON:  Previous exam(s).  FINDINGS: The exam is sub optimal secondary to motion. Motion is more pronounced with the left breast.  Breast composition: b. Scattered fibroglandular tissue.  Background parenchymal enhancement: Moderate.  Right breast: There is a 3.0 x 2.7 x 3.0 cm irregular enhancing mass in the central right breast. There is a signal void artifact in the mass corresponding with the biopsy clip.  Left breast: Imaging of the left breast is sub optimal secondary to motion. There is a left-sided Port-A-Cath. No suspicious mass or abnormal enhancement seen on the sub optimal images.  Lymph nodes: Two level I right axillary lymph nodes measuring 1.1 and 1.3 cm are seen. The cortices seen mildly prominent. A right axillary lymph node was biopsied on 01/28/2016 and was benign.  Ancillary findings:  None.  IMPRESSION: 3.0 cm mass in the central right breast corresponding with the biopsy proven invasive ductal carcinoma. Sub optimal imaging of the left breast secondary to motion. The patient will have a repeat MRI following neoadjuvant chemotherapy.  RECOMMENDATION: Treatment  planning of the known right breast invasive ductal carcinoma is recommended.  BI-RADS CATEGORY  6: Known biopsy-proven malignancy.   Electronically Signed   By: Lillia Mountain M.D.   On: 05/09/2016 13:08    ASSESSMENT: 40 y.o. [place] woman status post right breast upper-outer quadrant biopsy 01/28/2016 for a clinical T2 N0 invasive ductal carcinoma, grade 3, triple negative, with an MIB-1 of 70%.  (a) suspicious right axillary lymph node biopsied 01/28/2016 was benign  (1) neoadjuvant chemotherapy: doxorubicin and cyclophosphamide in dose dense fashion 4 started 04/14/16, completed 05/26/2016, followed by paclitaxel and carboplatin weekly 12, Started 06/09/2016  (a) taxol discontinued after 7 doses because of neuropathy, last dose 07/21/2016  (2) genetics testing pending  (3) right mastectomy and sentinel lymph node sampling 09/06/2016 showed a residual pT1c pN0, invasive ductal carcinoma, grade 3, with negative margins. Repeat prognostic panel again triple negative   (4) adjuvant radiation to follow with capecitabine/Xeloda sensitization   (5) patient is considering participation in SWOG 1418 trial  PLAN: The patient is scheduled to meet with radiation oncology tomorrow for consideration of adjuvant radiation. If she does receive radiation I would favor sensitizing capecitabine, namely 1 g twice daily on radiation days only.  We discussed that today in detail and Teriana has a good understanding of the possible toxicities, side effects and complications of that approach.  In terms of systemic therapy however I am more interested in her participating in the small trial. I would not want her receiving capecitabine to prevent her from enrolling if she does decide to enroll. Accordingly this is being looked at by her research nurse Marcellus Scott.   Today I wrote a prescription for tramadol for the patient to take in addition to the Advil she is receiving for her post mastectomy  discomfort. I also gave her a prescription for bras and prosthesis which hopefully her insurance will allow her to obtain.  Tentatively I have made her a return appointment with me for November 10 by which time she should be about ready to start her radiation treatments and we can operationalize the capecitabine decision as well as finalize her decision whether or not to  enroll in this walk study.  She knows to call for any problems that may develop before her next visit here.     Chauncey Cruel, MD 10/04/2016

## 2016-10-05 ENCOUNTER — Ambulatory Visit: Payer: Medicaid Other

## 2016-10-05 ENCOUNTER — Ambulatory Visit
Admission: RE | Admit: 2016-10-05 | Discharge: 2016-10-05 | Disposition: A | Discharge status: Home / Self Care | Payer: Medicaid Other | Source: Ambulatory Visit | Attending: Radiation Oncology | Admitting: Radiation Oncology

## 2016-10-05 ENCOUNTER — Encounter: Payer: Self-pay | Admitting: Genetic Counselor

## 2016-10-05 DIAGNOSIS — Z171 Estrogen receptor negative status [ER-]: Secondary | ICD-10-CM | POA: Insufficient documentation

## 2016-10-05 DIAGNOSIS — Z510 Encounter for antineoplastic radiation therapy: Secondary | ICD-10-CM | POA: Insufficient documentation

## 2016-10-05 DIAGNOSIS — C50411 Malignant neoplasm of upper-outer quadrant of right female breast: Secondary | ICD-10-CM | POA: Insufficient documentation

## 2016-10-05 DIAGNOSIS — Z9011 Acquired absence of right breast and nipple: Secondary | ICD-10-CM | POA: Insufficient documentation

## 2016-10-05 DIAGNOSIS — Z79899 Other long term (current) drug therapy: Secondary | ICD-10-CM | POA: Insufficient documentation

## 2016-10-05 DIAGNOSIS — C773 Secondary and unspecified malignant neoplasm of axilla and upper limb lymph nodes: Secondary | ICD-10-CM | POA: Insufficient documentation

## 2016-10-05 DIAGNOSIS — Z9221 Personal history of antineoplastic chemotherapy: Secondary | ICD-10-CM | POA: Insufficient documentation

## 2016-10-05 NOTE — Progress Notes (Addendum)
REFERRING PROVIDER: Lurline Del, MD  PRIMARY PROVIDER:  Dorena Dew, FNP  PRIMARY REASON FOR VISIT:  1. Malignant neoplasm of upper-outer quadrant of right breast in female, estrogen receptor negative ([place])   2. Triple negative malignant neoplasm of breast ([place])   3. Family history of breast cancer   4. Family history of malignant neoplasm of gastrointestinal tract      HISTORY OF PRESENT ILLNESS:   [name], a 40 y.o. female, was seen for a [place] cancer genetics consultation at the request of Dr. [name] due to a personal history of triple negative breast cancer at 15 and family history of breast and other cancers.  [name] presents to clinic today to discuss the possibility of a hereditary predisposition to cancer, genetic testing, and to further clarify her future cancer risks, as well as potential cancer risks for family members.   In February 2017, at the age of 22, Rebecca [name] was diagnosed with invasive ductal carcinoma of the right breast.  Hormone receptor status was triple negative. This was treated with neoadjuvant chemotherapy and right mastectomy.  [name] reports no additional personal history of cancer.    HORMONAL RISK FACTORS:  Menarche was at age 47.  First live birth at age 56.  OCP use for approximately "a few years" remotely.  Ovaries intact: yes.  Hysterectomy: no.  Menopausal status: uncertain - has not had period since chemotherapy.  HRT use: 0 years. Colonoscopy: no; not examined. Mammogram within the last year: this was her first mammogram. Number of breast biopsies: 1. Up to date with pelvic exams:  Reports that she is overdue. Any excessive radiation exposure/other exposures in the past:  Reports some history of secondhand smoke exposure  Past Medical History:  Diagnosis Date  . Anxiety   . Breast cancer ([place])   . Depression   . GERD (gastroesophageal reflux disease)   . Hypertension    states losartan and coreg are for her heart   . Obesity (BMI 35.0-39.9 without comorbidity)   . Seasonal allergies   . Sickle cell trait ([place])   . Termination of pregnancy (fetus) 04/02/16    Past Surgical History:  Procedure Laterality Date  . CESAREAN SECTION     2004 and 2007  . MASTECTOMY W/ SENTINEL NODE BIOPSY Right 09/06/2016   Procedure: RIGHT BREAST MASTECTOMY WITH RIGHT AXILLARY SENTINEL LYMPH NODE BIOPSY;  Surgeon: Alphonsa Overall, MD;  Location: [facility];  Service: General;  Laterality: Right;  . PORT-A-CATH REMOVAL Left 09/06/2016   Procedure: REMOVAL PORT-A-CATH;  Surgeon: Alphonsa Overall, MD;  Location: [facility];  Service: General;  Laterality: Left;  . PORTACATH PLACEMENT      Social History   Social History  . Marital status: Single    Spouse name: N/A  . Number of children: N/A  . Years of education: N/A   Social History Main Topics  . Smoking status: Current Every Day Smoker    Packs/day: 0.25    Years: 20.00    Types: Cigarettes  . Smokeless tobacco: Never Used     Comment: using Nicoderm patches  . Alcohol use Yes     Comment: occ  . Drug use: No  . Sexual activity: Not on file   Other Topics Concern  . Not on file   Social History Narrative  . No narrative on file     FAMILY HISTORY:  We obtained a detailed, 4-generation family history.  Significant diagnoses are listed below: Family  History  Problem Relation Age of Onset  . Hypertension Mother   . Cancer Mother     dx "intestinal cancer" in her 39s; +surgery  . Other Mother     hysterectomy at young age for unspecified cause  . Heart Problems Mother   . Breast cancer Cousin     maternal 1st cousin dx female breast cancer at 73-46y  . Cancer Father   . Hypertension Father   . Heart Problems Maternal Aunt   . Diabetes Maternal Aunt   . Breast cancer Maternal Uncle     dx 64-65  . Heart Problems Maternal Uncle   . Breast cancer Maternal Grandmother 34  . Throat cancer Maternal Grandfather     d. 63s;  smoker  . Sickle cell anemia Paternal Aunt   . Congestive Heart Failure Maternal Aunt   . Multiple sclerosis Cousin   . Cancer Other     maternal great uncle (MGM's brother); cancer removed from his side  . Heart attack Paternal Aunt     d. early 86s    Rebecca [name] has two sons and two daughters, ages 36-25.  Her oldest son has two daughters of his own.  [name] has one full brother who is currently 50 and has never had cancer.  She also has a maternal half-brother who is 17 and cancer-free.  [name] mother is currently in her mid-39s.  She was diagnosed with an "intestinal cancer" in her 75s and this was treated with surgery.  She also underwent a hysterectomy at a young age for an unspecified reason.  [name] has limited contact with her father and does not have any information for him.  [name] mother has one full brother and two full sisters.  Her brother is currently 76 and was diagnosed with breast cancer at 27-65.  He has one son who is cancer-free.  One sister died of congestive heart failure at the age of 21.  She had two sons and one daughter, and one of her sons was diagnosed with breast cancer at age 79-46.  The other sister is currently 5 and has never had cancer.  [name] maternal grandmother was diagnosed with breast cancer at 97 and passed away at 47.  She had approximately 14 siblings and Rebecca [name] reports that one of her brothers has a history of an unspecified type of cancer "removed from his side".  [name] maternal grandfather died of throat cancer in his 62s.    [name] father had three full sisters and two full brothers.  One brother had a history of "blackouts" and passed away from a related cause in his late 52s-30s.  [name] has no information for the other brother.  One sister died of sickle cell disease, another sister died of a heart attack in her early 50s, and the third sister is still living.  [name] reports no known history of cancer for any  of her paternal first cousins.  Her paternal grandmother died of an unspecified cause when her father was young.  Her grandfather died of "age-related" causes when he was in his 46s.   [name] reports no known family history of genetic testing for hereditary cancer risks.  Patient's maternal ancestors are of [place] American, Native American, and Caucasian descent, and paternal ancestors are of [place] American and Caucasian descent. There is no reported Ashkenazi Jewish ancestry. There is no known consanguinity.  GENETIC COUNSELING ASSESSMENT: Emilie Carp is a 40  y.o. female with a personal and family history of breast cancer which is somewhat suggestive of a hereditary breast cancer syndrome and predisposition to cancer. We, therefore, discussed and recommended the following at today's visit.   DISCUSSION: We reviewed the characteristics, features and inheritance patterns of hereditary cancer syndromes, particularly those caused by mutations within the BRCA1/2 and Lynch syndrome genes. We also discussed genetic testing, including the appropriate family members to test, the process of testing, insurance coverage and turn-around-time for results. We discussed the implications of a negative, positive and/or variant of uncertain significant result. We recommended Rebecca [name] pursue genetic testing for the 32-gene Comprehensive Cancer Panel with MSH2 Exons 1-7 Inversion Analysis through [facility].  The Comprehensive Cancer Panel offered by [facility] Junius Roads, MD) includes sequencing and/or deletion duplication testing of the following 32 genes: APC, ATM, AXIN2, BARD1, BMPR1A, BRCA1, BRCA2, BRIP1, CDH1, CDK4, CDKN2A, CHEK2, EPCAM, FANCC, MLH1, MSH2, MSH6, MUTYH, NBN, PALB2, PMS2, POLD1, POLE, PTEN, RAD51C, RAD51D, SCG5/GREM1, SMAD4, STK11, TP53, VHL, and XRCC2.     Based on Rebecca [name]'s personal and family history of cancer, she meets medical criteria for genetic testing. [name] is  insured under [place] Medicaid, so she will have no out-of-pocket cost for genetic testing.  We discussed that if she receives a bill for genetic testing that she should call us, as the bill was sent in error.  PLAN: After considering the risks, benefits, and limitations, Rebecca [name]  provided informed consent to pursue genetic testing and the blood sample was sent to [facility] for analysis of the 32-gene Comprehensive Cancer Panel with MSH2 Exons 1-7 Inversion Analysis. Results should be available within approximately 2-3 weeks' time, at which point they will be disclosed by telephone to Rebecca [name], as will any additional recommendations warranted by these results. [name] will receive a summary of her genetic counseling visit and a copy of her results once available. This information will also be available in Epic. We encouraged Rebecca [name] to remain in contact with cancer genetics annually so that we can continuously update the family history and inform her of any changes in cancer genetics and testing that may be of benefit for her family. [name] questions were answered to her satisfaction today. Our contact information was provided should additional questions or concerns arise.  Thank you for the referral and allowing [place] to share in the care of your patient.   Jeanine Luz, MS, [facility] Certified Genetic Counselor [email or web address] Phone: [phone]  The patient was seen for a total of 60 minutes in face-to-face genetic counseling.  This patient was discussed with Drs. Magrinat, Lindi Adie and/or Burr Medico who agrees with the above.    _______________________________________________________________________ For Office Staff:  Number of people involved in session: 1 Was an Intern/ student involved with case: no

## 2016-10-05 NOTE — Progress Notes (Signed)
Rebecca [name] left a message on answering machine at her home and cell phone telephone numbers asked to call [phone] about her 1030 nurse appointment and 1100 appointment with Dr. Sondra Come and ask for [facility].

## 2016-10-06 ENCOUNTER — Telehealth: Payer: Self-pay | Admitting: *Deleted

## 2016-10-06 NOTE — Telephone Encounter (Signed)
Called pt concerning missed appt with Dr. Sondra Come on 11/1. Pt relate she was unaware of appt. Received a new appt with Dr. Sondra Come on 11/29. Called pt with new appt and confirmed date and time. Denies further needs at this time. Encourage pt to call with questions.

## 2016-10-10 ENCOUNTER — Other Ambulatory Visit: Payer: Self-pay | Admitting: Oncology

## 2016-10-10 NOTE — Progress Notes (Signed)
[facility]  Telephone:([phone] [phone]) [phone]   ID: [name] DOB: 1975-12-06  MR#: [id number]  PVV#:[id number]  Patient Care Team: Rebecca Dew, FNP as PCP - General (Family Medicine) Rebecca Overall, MD as Consulting Physician (General Surgery) Rebecca Cruel, MD as Consulting Physician (Oncology) Rebecca Pray, MD as Consulting Physician (Radiation Oncology) Rebecca Cheese, NP as Nurse Practitioner (Hematology and Oncology) Rebecca Norway, RN as Registered Nurse PCP: Rebecca Dew, FNP GYN: OTHER MD:  CHIEF COMPLAINT: triple negative breast cancer  [name] TREATMENT: Adjuvant radiation pending  BREAST CANCER HISTORY: From the original intake note:  Rebecca [name] herself noted a change in her right breast sometime around September or October 2016. She did not bring it to intermediate medical attention, but on 01/13/2016 she established herself in Dr. Smith Robert' service and she was set up for bilateral diagnostic mammography with tomosynthesis and bilateral ultrasonography at the [facility] 01/19/2016. The breast density was category B. In the upper outer quadrant of the right breast there was a spiculated mass measuring 2.8 cm. On physical exam this was palpable. Targeted ultrasonography confirmed an irregular hypoechoic mass in the right breast 11:30 o'clock position measuring 2.6 cm maximally. Ultrasound of the right axilla showed a morphologically abnormal lymph node.  In the left breast there were some tubular densities behind the areola which by ultrasonography showed benign ductal ectasia.  On 01/28/2016 Rebecca [name] underwent biopsy of the right breast mass and abnormal right axillary lymph node. The pathology from this procedure (S [id number]) showed the lymph node to be benign. In the breast however there was an invasive ductal carcinoma, grade 3, which was estrogen and progesterone receptor negative. The proliferation marker was 70%. HER-2 was not  amplified with a signals ratio of 1.32. The number per cell was 2.05.  The patient's subsequent history is as detailed below  INTERVAL HISTORY: Rebecca [name] returns today for follow-up of her triple negative breast cancer. Since her last visit here she underwent right mastectomy and sentinel lymph node sampling, on 09/06/2016. The final pathology ([id number]) showed invasive ductal carcinoma, 2.0 cm residual, with negative margins, and both sentinel lymph nodes clear. Repeat estrogen and progesterone studies were again negative as was the repeat HER-2, with a signals ratio of 1.06 and the number per cell 1.65.  Her case was presented at the multidisciplinary breast cancer conference 09/28/2016. At that time it was felt that she would benefit from Xeloda post op, with radiation, and also that she may want to participate in the SWOG immunotherapy trial as well as B-Well  REVIEW OF SYSTEMS: Rebecca [name] did well with her surgery although she still has pain. She has run out of narcotics and is currently using ibuprofen. She has tingling feelings in her fingers and toes. Sometimes her leg "goes to sleep". She has forgetfulness and anxiety. She denies depression. Sometimes she feels weak. She has some heartburn problems and she says her appetite is poor. She can be short of breath when walking. She complains of hearing loss and some sinus problems including a runny nose. Aside from these issues a detailed review of systems today was stable  PAST MEDICAL HISTORY: Past Medical History:  Diagnosis Date  . Anxiety   . Breast cancer ([place])   . Depression   . GERD (gastroesophageal reflux disease)   . Hypertension    pt states is currently on no medications   . Obesity (BMI 35.0-39.9 without comorbidity) ([place])   . Seasonal allergies   . Sickle  cell trait ([place])   . Termination of pregnancy (fetus) 04/02/16    PAST SURGICAL HISTORY: Past Surgical History:  Procedure Laterality Date  . CESAREAN SECTION     2004  and 2007    FAMILY HISTORY Family History  Problem Relation Age of Onset  . Hypertension Mother   . Uterine cancer Mother   . Breast cancer Cousin   . Cancer Father   . Hypertension Father   . Breast cancer Maternal Aunt   . Prostate cancer Maternal Uncle   . Breast cancer Maternal Grandmother   . Throat cancer Maternal Grandfather   The patient has very little information about her father. Her mother is currently 68 years old. She had a history of cervical cancer at age 62. The patient had 2 brothers, no sisters. The maternal grandfather had throat cancer. A maternal uncle was diagnosed with breast cancer as well as prostate cancer at the age of 56. 2 maternal cousins, one of the mail, had breast cancer as well.  GYNECOLOGIC HISTORY:  No LMP recorded. Menarche age 1, first live birth age 49. The patient is GX P4. She still having regular periods. She took oral contraceptives in the 1990s with no side effects.  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 The patient's significant other Rebecca [name] works at [employer]. In addition to them the patient's 3 children Rebecca [name], Rebecca [name] and Rebecca [name] are also in the home. There are age 15, 75, and 2 as of August 2017. The patient's son Rebecca [name], currently 48 years old, lives in [place].    ADVANCED DIRECTIVES: Not in place   HEALTH MAINTENANCE: Social History  Substance Use Topics  . Smoking status: [name] Every Day Smoker    Packs/day: 0.25    Years: 20.00    Types: Cigarettes  . Smokeless tobacco: Never Used  . Alcohol use Yes     Comment: occ     Colonoscopy:  PAP:  Bone density:  Lipid panel:  No Known Allergies  [name] Outpatient Prescriptions  Medication Sig Dispense Refill  . ketoconazole (NIZORAL) 2 % cream Apply 1 application topically daily. 15 g 0  . LORazepam (ATIVAN) 0.5 MG tablet Take 1 tablet (0.5 mg total) by  mouth at bedtime. 30 tablet 0  . omeprazole (PRILOSEC) 20 MG capsule Take 1 capsule (20 mg total) by mouth daily. 30 capsule 3  . potassium chloride (MICRO-K) 10 MEQ CR capsule Take 1 capsule (10 mEq total) by mouth daily. 30 capsule 3  . venlafaxine XR (EFFEXOR-XR) 75 MG 24 hr capsule Take 1 capsule (75 mg total) by mouth daily with breakfast. 30 capsule 6   No [name] facility-administered medications for this visit.    OBJECTIVE: Rebecca [name] In no acute distress  There were no vitals filed for this visit. There is no height or weight on file to calculate BMI.   ECOG FS:1 - Symptomatic but completely ambulatory  Sclerae unicteric, pupils round and equal Oropharynx clear and moist-- no thrush or other lesions No cervical or supraclavicular adenopathy Lungs no rales or rhonchi Heart regular rate and rhythm Abd soft, nontender, positive bowel sounds, no masses palpated MSK no focal spinal tenderness, no upper extremity lymphedema Neuro: nonfocal, well oriented, appropriate affect Breasts: Deferred     LAB RESULTS:  CMP     Component Value Date/Time   NA 142 07/21/2016 0826  K 3.1 (L) 07/21/2016 0826   CL 106 04/11/2016 1045   CO2 23 07/21/2016 0826   GLUCOSE 124 07/21/2016 0826   BUN 8.8 07/21/2016 0826   CREATININE 0.8 07/21/2016 0826   CALCIUM 9.5 07/21/2016 0826   PROT 7.5 07/21/2016 0826   ALBUMIN 3.9 07/21/2016 0826   AST 17 07/21/2016 0826   ALT 18 07/21/2016 0826   ALKPHOS 57 07/21/2016 0826   BILITOT 0.35 07/21/2016 0826   GFRNONAA >60 04/11/2016 1045   GFRNONAA >89 01/12/2016 1115   GFRAA >60 04/11/2016 1045   GFRAA >89 01/12/2016 1115    INo results found for: SPEP, UPEP  Lab Results  Component Value Date   WBC 4.2 07/21/2016   NEUTROABS 3.0 07/21/2016   HGB 9.3 (L) 07/21/2016   HCT 27.8 (L) 07/21/2016   MCV 99.3 07/21/2016   PLT 137 (L) 07/21/2016      Chemistry      Component Value Date/Time   NA 142 07/21/2016 0826   K  3.1 (L) 07/21/2016 0826   CL 106 04/11/2016 1045   CO2 23 07/21/2016 0826   BUN 8.8 07/21/2016 0826   CREATININE 0.8 07/21/2016 0826      Component Value Date/Time   CALCIUM 9.5 07/21/2016 0826   ALKPHOS 57 07/21/2016 0826   AST 17 07/21/2016 0826   ALT 18 07/21/2016 0826   BILITOT 0.35 07/21/2016 0826       No results found for: LABCA2  No components found for: LABCA125  No results for input(s): INR in the last 168 hours.  Urinalysis    Component Value Date/Time   LABSPEC 1.020 01/12/2016 1122   PHURINE 5.5 01/12/2016 1122   GLUCOSEU NEGATIVE 01/12/2016 1122   HGBUR TRACE (A) 01/12/2016 1122   BILIRUBINUR NEGATIVE 01/12/2016 1122   KETONESUR NEGATIVE 01/12/2016 1122   PROTEINUR NEGATIVE 01/12/2016 1122   UROBILINOGEN 0.2 01/12/2016 1122   NITRITE NEGATIVE 01/12/2016 1122   LEUKOCYTESUR NEGATIVE 01/12/2016 1122    ELIGIBLE FOR AVAILABLE RESEARCH PROTOCOL: ASA, B-WELL  STUDIES: ------------------------------------------------------------------- Transthoracic Echocardiography  Patient:    Rebecca [name], Rebecca [name] MR #:       [id number] Study Date: 08/03/2016 Gender:     F Age:        40 Height:     156.2 cm Weight:     81.2 kg BSA:        1.91 m^2 Pt. Status: Room:   ATTENDING    Loralie Champagne, M.D.  ORDERING     Loralie Champagne, M.D.  SONOGRAPHER  [facility]  PERFORMING   [facility] Outpatient  REFERRING    Cammie Sickle M  cc:  ------------------------------------------------------------------- LV EF: 50%  CLINICAL DATA:  40 year old female with biopsy proven invasive ductal carcinoma in the right breast. Patient's MRI was performed on 04/26/2016. Multiple attempts were made to contact the patient for repeat imaging secondary to motion. The patient never returned phone calls for repeat imaging. Leo Rod, nurse practitioner working with Dr. [name] was contacted about the repeat imaging, she states that the patient is having another MRI in 3 weeks  following neoadjuvant chemotherapy. The patient will not return for repeat imaging until that time.  LABS:  None obtained at the time of imaging.  EXAM: BILATERAL BREAST MRI WITH AND WITHOUT CONTRAST  TECHNIQUE: Multiplanar, multisequence MR images of both breasts were obtained prior to and following the intravenous administration of 16 ml of MultiHance.  THREE-DIMENSIONAL MR IMAGE RENDERING ON INDEPENDENT WORKSTATION:  Three-dimensional MR images were rendered by  post-processing of the original MR data on an independent workstation. The three-dimensional MR images were interpreted, and findings are reported in the following complete MRI report for this study. Three dimensional images were evaluated at the independent DynaCad workstation  COMPARISON:  Previous exam(s).  FINDINGS: The exam is sub optimal secondary to motion. Motion is more pronounced with the left breast.  Breast composition: b. Scattered fibroglandular tissue.  Background parenchymal enhancement: Moderate.  Right breast: There is a 3.0 x 2.7 x 3.0 cm irregular enhancing mass in the central right breast. There is a signal void artifact in the mass corresponding with the biopsy clip.  Left breast: Imaging of the left breast is sub optimal secondary to motion. There is a left-sided Port-A-Cath. No suspicious mass or abnormal enhancement seen on the sub optimal images.  Lymph nodes: Two level I right axillary lymph nodes measuring 1.1 and 1.3 cm are seen. The cortices seen mildly prominent. A right axillary lymph node was biopsied on 01/28/2016 and was benign.  Ancillary findings:  None.  IMPRESSION: 3.0 cm mass in the central right breast corresponding with the biopsy proven invasive ductal carcinoma. Sub optimal imaging of the left breast secondary to motion. The patient will have a repeat MRI following neoadjuvant chemotherapy.  RECOMMENDATION: Treatment planning of the known right  breast invasive ductal carcinoma is recommended.  BI-RADS CATEGORY  6: Known biopsy-proven malignancy.   Electronically Signed   By: Lillia Mountain M.D.   On: 05/09/2016 13:08    ASSESSMENT: 40 y.o. Rebecca [name] status post right breast upper-outer quadrant biopsy 01/28/2016 for a clinical T2 N0 invasive ductal carcinoma, grade 3, triple negative, with an MIB-1 of 70%.  (a) suspicious right axillary lymph node biopsied 01/28/2016 was benign  (1) neoadjuvant chemotherapy: doxorubicin and cyclophosphamide in dose dense fashion 4 started 04/14/16, completed 05/26/2016, followed by paclitaxel and carboplatin weekly 12, Started 06/09/2016  (a) taxol discontinued after 7 doses because of neuropathy, last dose 07/21/2016  (2) genetics testing pending  (3) right mastectomy and sentinel lymph node sampling 09/06/2016 showed a residual pT1c pN1, invasive ductal carcinoma, grade 3, with negative margins. Repeat prognostic panel again triple negative   (a) final stage is cT2 pN1, stage IIB  (4) adjuvant radiation to follow with capecitabine/Xeloda sensitization   (5) patient is considering participation in SWOG 1418 trial  PLAN: The patient is scheduled to meet with radiation oncology tomorrow for consideration of adjuvant radiation. If she does receive radiation I would favor sensitizing capecitabine, namely 1 g twice daily on radiation days only.  We discussed that today in detail and Rebecca [name] has a good understanding of the possible toxicities, side effects and complications of that approach.  In terms of systemic therapy however I am more interested in her participating in the small trial. I would not want her receiving capecitabine to prevent her from enrolling if she does decide to enroll. Accordingly this is being looked at by her research nurse Marcellus Scott.   Today I wrote a prescription for tramadol for the patient to take in addition to the Advil she is receiving for her post  mastectomy discomfort. I also gave her a prescription for bras and prosthesis which hopefully her insurance will allow her to obtain.  Tentatively I have made her a return appointment with me for November 10 by which time she should be about ready to start her radiation treatments and we can operationalize the capecitabine decision as well as finalize her decision whether or not to  enroll in this walk study.  She knows to call for any problems that may develop before her next visit here.     Rebecca Cruel, MD 10/10/2016

## 2016-10-11 ENCOUNTER — Telehealth: Payer: Self-pay | Admitting: Pharmacist

## 2016-10-11 ENCOUNTER — Other Ambulatory Visit: Payer: Self-pay | Admitting: Oncology

## 2016-10-11 NOTE — Telephone Encounter (Signed)
Oral Chemotherapy Pharmacist Encounter  Received notification from [facility] that patient's Xeloda prescription would have $3 copay.  Noted plan per MD note 10/31 is to take 1000mg  Xeloda by mouth BID on days of radiation only. Radiation is not yet scheduled.  Labs from 10/04/16 reviewed, ok for treatment.  Current medication list in Epic assessed, some DDIs with Xeloda identified: Xeloda and losartan: interaction through CYP2C9 as Xeloda is a strong 2C9 inhibitor, may lead to increased exposure to the losartan, this will be monitored before changing ARB therapy Xeloda and omeprazole: omeprazole may decrease absorption of Xeloda with potential to decrease PFS seen in post-hoc analysis of capecitabine trials. Clinical implication is not yet elucidated, no need for therapy change at this time.  [facility]ral [facility]o [facility] will follow-up for start date and initial counseling closure to start of radiation.  Johny Drilling, PharmD, BCPS 10/11/2016  12:08 PM Oral Chemotherapy Clinic [phone]

## 2016-10-17 ENCOUNTER — Other Ambulatory Visit: Payer: Self-pay | Admitting: Oncology

## 2016-10-17 NOTE — Progress Notes (Signed)
[place]  [phone] [phone]   ID: [name] DOB: [birth year]  MR#: [id number]  KXF#:[id number]  Patient Care Team: Dorena Dew, FNP as PCP - General (Family Medicine) Alphonsa Overall, MD as Consulting Physician (General Surgery) Chauncey Cruel, MD as Consulting Physician (Oncology) Gery Pray, MD as Consulting Physician (Radiation Oncology) Sylvan Cheese, NP as Nurse Practitioner (Hematology and Oncology) Benson Norway, RN as Registered Nurse PCP: Dorena Dew, FNP GYN: OTHER MD:  CHIEF COMPLAINT: triple negative breast cancer  CURRENT TREATMENT: Adjuvant radiation pending  BREAST CANCER HISTORY: From the original intake note:  Rebecca [name] herself noted a change in her right breast sometime around September or October 2016. She did not bring it to intermediate medical attention, but on 01/13/2016 she established herself in Dr. Smith Robert' service and she was set up for bilateral diagnostic mammography with tomosynthesis and bilateral ultrasonography at the [facility] 01/19/2016. The breast density was category B. In the upper outer quadrant of the right breast there was a spiculated mass measuring 2.8 cm. On physical exam this was palpable. Targeted ultrasonography confirmed an irregular hypoechoic mass in the right breast 11:30 o'clock position measuring 2.6 cm maximally. Ultrasound of the right axilla showed a morphologically abnormal lymph node.  In the left breast there were some tubular densities behind the areola which by ultrasonography showed benign ductal ectasia.  On 01/28/2016 [name] underwent biopsy of the right breast mass and abnormal right axillary lymph node. The pathology from this procedure (S [id number]) showed the lymph node to be benign. In the breast however there was an invasive ductal carcinoma, grade 3, which was estrogen and progesterone receptor negative. The proliferation marker was 70%. HER-2 was not  amplified with a signals ratio of 1.32. The number per cell was 2.05.  The patient's subsequent history is as detailed below  INTERVAL HISTORY: Rebecca [name] returns today for follow-up of her triple negative breast cancer. Since her last visit here she underwent right mastectomy and sentinel lymph node sampling, on 09/06/2016. The final pathology ([id number]) showed invasive ductal carcinoma, 2.0 cm residual, with negative margins, and one of two sentinel lymph nodes sampled positive for carcinoma. Repeat estrogen and progesterone studies were again negative as was the repeat HER-2, with a signals ratio of 1.06 and the number per cell 1.65.  Her case was presented at the multidisciplinary breast cancer conference 09/28/2016. At that time it was felt that she would benefit from Xeloda post op, with radiation, and also that she may want to participate in the SWOG immunotherapy trial as well as B-Well  REVIEW OF SYSTEMS: Rebecca [name] did well with her surgery although she still has pain. She has run out of narcotics and is currently using ibuprofen. She has tingling feelings in her fingers and toes. Sometimes her leg "goes to sleep". She has forgetfulness and anxiety. She denies depression. Sometimes she feels weak. She has some heartburn problems and she says her appetite is poor. She can be short of breath when walking. She complains of hearing loss and some sinus problems including a runny nose. Aside from these issues a detailed review of systems today was stable  PAST MEDICAL HISTORY: Past Medical History:  Diagnosis Date  . Anxiety   . Breast cancer ([place])   . Depression   . GERD (gastroesophageal reflux disease)   . Hypertension    pt states is currently on no medications   . Obesity (BMI 35.0-39.9 without comorbidity) ([place])   . Seasonal  allergies   . Sickle cell trait ([place])   . Termination of pregnancy (fetus) 04/02/16    PAST SURGICAL HISTORY: Past Surgical History:  Procedure Laterality Date    . CESAREAN SECTION     2004 and 2007    FAMILY HISTORY Family History  Problem Relation Age of Onset  . Hypertension Mother   . Uterine cancer Mother   . Breast cancer Cousin   . Cancer Father   . Hypertension Father   . Breast cancer Maternal Aunt   . Prostate cancer Maternal Uncle   . Breast cancer Maternal Grandmother   . Throat cancer Maternal Grandfather   The patient has very little information about her father. Her mother is currently 51 years old. She had a history of cervical cancer at age 51. The patient had 2 brothers, no sisters. The maternal grandfather had throat cancer. A maternal uncle was diagnosed with breast cancer as well as prostate cancer at the age of 74. 2 maternal cousins, one of the mail, had breast cancer as well.  GYNECOLOGIC HISTORY:  No LMP recorded. Menarche age 63, first live birth age 41. The patient is GX P4. She still having regular periods. She took oral contraceptives in the 1990s with no side effects.  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 The patient's significant other [name] works at [employer]. In addition to them the patient's 3 children [name], [name] and [name] are also in the home. There are age 54, 55, and 59 as of August 2017. The patient's son [name], currently 53 years old, lives in [place].    ADVANCED DIRECTIVES: Not in place   HEALTH MAINTENANCE: Social History  Substance Use Topics  . Smoking status: Current Every Day Smoker    Packs/day: 0.25    Years: 20.00    Types: Cigarettes  . Smokeless tobacco: Never Used  . Alcohol use Yes     Comment: occ     Colonoscopy:  PAP:  Bone density:  Lipid panel:  No Known Allergies  Current Outpatient Prescriptions  Medication Sig Dispense Refill  . ketoconazole (NIZORAL) 2 % cream Apply 1 application topically daily. 15 g 0  . LORazepam (ATIVAN) 0.5 MG tablet  Take 1 tablet (0.5 mg total) by mouth at bedtime. 30 tablet 0  . omeprazole (PRILOSEC) 20 MG capsule Take 1 capsule (20 mg total) by mouth daily. 30 capsule 3  . potassium chloride (MICRO-K) 10 MEQ CR capsule Take 1 capsule (10 mEq total) by mouth daily. 30 capsule 3  . venlafaxine XR (EFFEXOR-XR) 75 MG 24 hr capsule Take 1 capsule (75 mg total) by mouth daily with breakfast. 30 capsule 6   No current facility-administered medications for this visit.    OBJECTIVE: Rebecca [name] In no acute distress  There were no vitals filed for this visit. There is no height or weight on file to calculate BMI.   ECOG FS:1 - Symptomatic but completely ambulatory  Sclerae unicteric, pupils round and equal Oropharynx clear and moist-- no thrush or other lesions No cervical or supraclavicular adenopathy Lungs no rales or rhonchi Heart regular rate and rhythm Abd soft, nontender, positive bowel sounds, no masses palpated MSK no focal spinal tenderness, no upper extremity lymphedema Neuro: nonfocal, well oriented, appropriate affect Breasts: Deferred     LAB RESULTS:  CMP     Component Value Date/Time  NA 142 07/21/2016 0826   K 3.1 (L) 07/21/2016 0826   CL 106 04/11/2016 1045   CO2 23 07/21/2016 0826   GLUCOSE 124 07/21/2016 0826   BUN 8.8 07/21/2016 0826   CREATININE 0.8 07/21/2016 0826   CALCIUM 9.5 07/21/2016 0826   PROT 7.5 07/21/2016 0826   ALBUMIN 3.9 07/21/2016 0826   AST 17 07/21/2016 0826   ALT 18 07/21/2016 0826   ALKPHOS 57 07/21/2016 0826   BILITOT 0.35 07/21/2016 0826   GFRNONAA >60 04/11/2016 1045   GFRNONAA >89 01/12/2016 1115   GFRAA >60 04/11/2016 1045   GFRAA >89 01/12/2016 1115    INo results found for: SPEP, UPEP  Lab Results  Component Value Date   WBC 4.2 07/21/2016   NEUTROABS 3.0 07/21/2016   HGB 9.3 (L) 07/21/2016   HCT 27.8 (L) 07/21/2016   MCV 99.3 07/21/2016   PLT 137 (L) 07/21/2016      Chemistry      Component Value Date/Time     NA 142 07/21/2016 0826   K 3.1 (L) 07/21/2016 0826   CL 106 04/11/2016 1045   CO2 23 07/21/2016 0826   BUN 8.8 07/21/2016 0826   CREATININE 0.8 07/21/2016 0826      Component Value Date/Time   CALCIUM 9.5 07/21/2016 0826   ALKPHOS 57 07/21/2016 0826   AST 17 07/21/2016 0826   ALT 18 07/21/2016 0826   BILITOT 0.35 07/21/2016 0826       No results found for: LABCA2  No components found for: LABCA125  No results for input(s): INR in the last 168 hours.  Urinalysis    Component Value Date/Time   LABSPEC 1.020 01/12/2016 1122   PHURINE 5.5 01/12/2016 1122   GLUCOSEU NEGATIVE 01/12/2016 1122   HGBUR TRACE (A) 01/12/2016 1122   BILIRUBINUR NEGATIVE 01/12/2016 1122   KETONESUR NEGATIVE 01/12/2016 1122   PROTEINUR NEGATIVE 01/12/2016 1122   UROBILINOGEN 0.2 01/12/2016 1122   NITRITE NEGATIVE 01/12/2016 1122   LEUKOCYTESUR NEGATIVE 01/12/2016 1122    ELIGIBLE FOR AVAILABLE RESEARCH PROTOCOL: ASA, B-WELL  STUDIES: ------------------------------------------------------------------- Transthoracic Echocardiography  Patient:    Rebecca [name], Rebecca [name] MR #:       [id number] Study Date: 08/03/2016 Gender:     F Age:        15 Height:     156.2 cm Weight:     81.2 kg BSA:        1.91 m^2 Pt. Status: Room:   ATTENDING    Loralie Champagne, M.D.  ORDERING     Loralie Champagne, M.D.  SONOGRAPHER  [facility]  PERFORMING   [facility], Outpatient  REFERRING    Cammie Sickle M  cc:  ------------------------------------------------------------------- LV EF: 50%  CLINICAL DATA:  40 year old female with biopsy proven invasive ductal carcinoma in the right breast. Patient's MRI was performed on 04/26/2016. Multiple attempts were made to contact the patient for repeat imaging secondary to motion. The patient never returned phone calls for repeat imaging. Leo Rod, nurse practitioner working with Dr. [name] was contacted about the repeat imaging, she states that the patient is  having another MRI in 3 weeks following neoadjuvant chemotherapy. The patient will not return for repeat imaging until that time.  LABS:  None obtained at the time of imaging.  EXAM: BILATERAL BREAST MRI WITH AND WITHOUT CONTRAST  TECHNIQUE: Multiplanar, multisequence MR images of both breasts were obtained prior to and following the intravenous administration of 16 ml of MultiHance.  THREE-DIMENSIONAL MR IMAGE RENDERING ON INDEPENDENT WORKSTATION:  Three-dimensional MR images were rendered by post-processing of the original MR data on an independent workstation. The three-dimensional MR images were interpreted, and findings are reported in the following complete MRI report for this study. Three dimensional images were evaluated at the independent DynaCad workstation  COMPARISON:  Previous exam(s).  FINDINGS: The exam is sub optimal secondary to motion. Motion is more pronounced with the left breast.  Breast composition: b. Scattered fibroglandular tissue.  Background parenchymal enhancement: Moderate.  Right breast: There is a 3.0 x 2.7 x 3.0 cm irregular enhancing mass in the central right breast. There is a signal void artifact in the mass corresponding with the biopsy clip.  Left breast: Imaging of the left breast is sub optimal secondary to motion. There is a left-sided Port-A-Cath. No suspicious mass or abnormal enhancement seen on the sub optimal images.  Lymph nodes: Two level I right axillary lymph nodes measuring 1.1 and 1.3 cm are seen. The cortices seen mildly prominent. A right axillary lymph node was biopsied on 01/28/2016 and was benign.  Ancillary findings:  None.  IMPRESSION: 3.0 cm mass in the central right breast corresponding with the biopsy proven invasive ductal carcinoma. Sub optimal imaging of the left breast secondary to motion. The patient will have a repeat MRI following neoadjuvant  chemotherapy.  RECOMMENDATION: Treatment planning of the known right breast invasive ductal carcinoma is recommended.  BI-RADS CATEGORY  6: Known biopsy-proven malignancy.   Electronically Signed   By: Lillia Mountain M.D.   On: 05/09/2016 13:08    ASSESSMENT: 40 y.o. [place] [name] status post right breast upper-outer quadrant biopsy 01/28/2016 for a clinical T2 N0 invasive ductal carcinoma, grade 3, triple negative, with an MIB-1 of 70%.  (a) suspicious right axillary lymph node biopsied 01/28/2016 was benign  (1) neoadjuvant chemotherapy: doxorubicin and cyclophosphamide in dose dense fashion 4 started 04/14/16, completed 05/26/2016, followed by paclitaxel and carboplatin weekly 12, Started 06/09/2016  (a) taxol discontinued after 7 doses because of neuropathy, last dose 07/21/2016  (2) genetics testing pending  (3) right mastectomy and sentinel lymph node sampling 09/06/2016 showed a residual pT1c pN1, invasive ductal carcinoma, grade 3, with negative margins. Repeat prognostic panel again triple negative   (a) final stage is cT2 pN1, stage IIB  (4) adjuvant radiation to follow with capecitabine/Xeloda sensitization   (5) patient is considering participation in SWOG 1418 trial  PLAN: The patient is scheduled to meet with radiation oncology tomorrow for consideration of adjuvant radiation. If she does receive radiation I would favor sensitizing capecitabine, namely 1 g twice daily on radiation days only.  We discussed that today in detail and Rebecca [name] has a good understanding of the possible toxicities, side effects and complications of that approach.  In terms of systemic therapy however I am more interested in her participating in the small trial. I would not want her receiving capecitabine to prevent her from enrolling if she does decide to enroll. Accordingly this is being looked at by her research nurse Marcellus Scott.   Today I wrote a prescription for tramadol for the  patient to take in addition to the Advil she is receiving for her post mastectomy discomfort. I also gave her a prescription for bras and prosthesis which hopefully her insurance will allow her to obtain.  Tentatively I have made her a return appointment with me for November 10 by which time she should be about ready to start her radiation treatments and we can operationalize the capecitabine decision as well as finalize  her decision whether or not to enroll in this walk study.  She knows to call for any problems that may develop before her next visit here.     Chauncey Cruel, MD 10/17/2016

## 2016-10-24 ENCOUNTER — Telehealth: Payer: Self-pay | Admitting: Genetic Counselor

## 2016-10-24 NOTE — Telephone Encounter (Signed)
Discussed with Rebecca [name] that her genetic test results were negative for mutations within any of 32 genes on the Comprehensive Cancer Panel (with MSH2 exons 1-7 inversion analysis) through [employer].  Additionally, no variants of uncertain significance (VUSes) were found.  Discussed that we still do not have an explanation for the personal and family history of cancer.  Recommended genetic counseling and testing for her maternal uncle and maternal first cousin (both of whom have a history of female breast cancer) and also recommended testing for Rebecca [name]'s mother who had an intestinal cancer in her 84s.  Rebecca [name] plans to discuss this option with her relatives.  Discussed that Rebecca [name]'s daughters and nieces can begin mammogram screening at 62, or earlier as recommended by a doctor.  Encouraged Rebecca [name] to continue to follow her doctors' recommendations for cancer screening.  She should keep in touch with us--update the family history with [place] if anyone else in the family is diagnosed with cancer and she should check back with [place] in the future about updated genetic testing options.  She knows she is welcome to call or email with any questions.  I will mail her a copy of her result.

## 2016-10-25 ENCOUNTER — Ambulatory Visit: Payer: Self-pay | Admitting: Genetic Counselor

## 2016-10-25 DIAGNOSIS — Z803 Family history of malignant neoplasm of breast: Secondary | ICD-10-CM

## 2016-10-25 DIAGNOSIS — Z800 Family history of malignant neoplasm of digestive organs: Secondary | ICD-10-CM

## 2016-10-25 DIAGNOSIS — Z1379 Encounter for other screening for genetic and chromosomal anomalies: Secondary | ICD-10-CM

## 2016-10-25 DIAGNOSIS — Z809 Family history of malignant neoplasm, unspecified: Secondary | ICD-10-CM

## 2016-10-25 DIAGNOSIS — C50411 Malignant neoplasm of upper-outer quadrant of right female breast: Secondary | ICD-10-CM

## 2016-10-31 NOTE — Progress Notes (Signed)
Location of Breast Cancer: right breast cancer  Histology per Pathology Report:   09/06/16 Diagnosis 1. Breast, simple mastectomy, right - INVASIVE DUCTAL CARCINOMA, 2 CM. - MARGINS NOT INVOLVED. - INVASIVE CARCINOMA 0.3 CM FROM POSTERIOR MARGIN. 2. Lymph node, sentinel, biopsy, Right axillary - ONE BENIGN LYMPH NODE (0/1). 3. Lymph node, sentinel, biopsy, Right axillary - METASTATIC CARCINOMA IN ONE LYMPH NODE (1/1). 4. Skin , Right superior skin flap - BENIGN SKIN AND SUBCUTANEOUS ADIPOSE TISSUE. - NO EVIDENCE OF MALIGNANCY.  01/28/16 Diagnosis 1. Breast, right, needle core biopsy, 11:30 o'clock- upper outer INVASIVE HIGH GRADE DUCTAL CARCINOMA, GRADE 3 2. Lymph node, needle/core biopsy, right axillary ONE BENIGN LYMPH NODE (0/1)  Receptor Status: ER(0%), PR (0%), Her2-neu (neg), Ki-(70%)  Did patient present with symptoms (if so, please note symptoms) or was this found on screening mammography?: patient noted a change in her right breast  Past/Anticipated interventions by surgeon, if any: 09/06/16 - Procedure: RIGHT BREAST MASTECTOMY WITH RIGHT AXILLARY SENTINEL LYMPH NODE BIOPSY;  Surgeon: David Newman, MD  Past/Anticipated interventions by medical oncology, if any: neoadjuvant chemotherapy: doxorubicin and cyclophosphamide in dose dense fashion 4 started 04/14/16, completed 05/26/2016, followed by paclitaxel and carboplatin weekly 12, Started 06/09/2016. Taxol discontinued after 7 doses because of neuropathy, last dose 07/21/2016.  Lymphedema issues, if any:  no  Pain issues, if any:  yes has aching pain in her right chest from surgery.  She is taking Tramadol.  OB Gyn history:Menarche age 7, first live birth age 15. The patient is GX P4. She took oral contraceptives in the 1990s.  She has a history of breast cancer in her maternal grandmother and a cousin.   SAFETY ISSUES:  Prior radiation? no  Pacemaker/ICD? no  Possible current pregnancy?no  Is the patient on  methotrexate? no  Current Complaints / other details:  Patient is not sure if she should be taking Coreg or when to start Xeloda.  Dr. Magrinat's office will be called to verify these medications.    BP 126/82 (BP Location: Left Arm, Patient Position: Sitting)   Pulse 82   Temp 98.1 F (36.7 C) (Oral)   Ht 5' 1.5" (1.562 m)   Wt 183 lb (83 kg)   SpO2 100%   BMI 34.02 kg/m    Wt Readings from Last 3 Encounters:  11/02/16 183 lb (83 kg)  10/04/16 179 lb 3.2 oz (81.3 kg)  09/06/16 181 lb (82.1 kg)   Hess, Karen R, RN 10/31/2016,9:02 AM   

## 2016-11-02 ENCOUNTER — Ambulatory Visit
Admission: RE | Admit: 2016-11-02 | Discharge: 2016-11-02 | Disposition: A | Discharge status: Home / Self Care | Payer: Medicaid Other | Source: Ambulatory Visit | Attending: Radiation Oncology | Admitting: Radiation Oncology

## 2016-11-02 ENCOUNTER — Encounter: Payer: Self-pay | Admitting: Radiation Oncology

## 2016-11-02 VITALS — BP 126/82 | HR 82 | Temp 98.1°F | Ht 61.5 in | Wt 183.0 lb

## 2016-11-02 DIAGNOSIS — C773 Secondary and unspecified malignant neoplasm of axilla and upper limb lymph nodes: Secondary | ICD-10-CM | POA: Diagnosis not present

## 2016-11-02 DIAGNOSIS — C50411 Malignant neoplasm of upper-outer quadrant of right female breast: Secondary | ICD-10-CM

## 2016-11-02 DIAGNOSIS — Z9221 Personal history of antineoplastic chemotherapy: Secondary | ICD-10-CM | POA: Diagnosis not present

## 2016-11-02 DIAGNOSIS — Z510 Encounter for antineoplastic radiation therapy: Secondary | ICD-10-CM | POA: Diagnosis not present

## 2016-11-02 DIAGNOSIS — Z171 Estrogen receptor negative status [ER-]: Secondary | ICD-10-CM | POA: Diagnosis not present

## 2016-11-02 DIAGNOSIS — Z9011 Acquired absence of right breast and nipple: Secondary | ICD-10-CM | POA: Diagnosis not present

## 2016-11-02 DIAGNOSIS — Z79899 Other long term (current) drug therapy: Secondary | ICD-10-CM | POA: Diagnosis not present

## 2016-11-02 NOTE — Progress Notes (Signed)
Radiation Oncology         [phone] ________________________________  Name: Rebecca [name] MRN: [id number]  Date: 11/02/2016  DOB: Jan 07, 1976  Re-Evaluation Visit Note  CC: Dorena Dew, FNP  Magrinat, [name], MD    ICD-9-CM ICD-10-CM   1. Malignant neoplasm of upper-outer quadrant of right breast in female, estrogen receptor negative ([place]) 174.4 C50.411 Ambulatory referral to Social Work   V86.1 Z17.1     Diagnosis: (ypT1c, ypN1a) grade 3 invasive ductal carcinoma of the right breast (triple negative)  Interval Since Last Radiation: N/A  Narrative:  The patient returns today for a re-evaluation for breast cancer. The patient was initially seen in breast clinic on 02/10/16.  Under the care of Dr. [name], the patient underwent neoadjuvant chemotherapy. Doxorubicin and cyclophosphamide in dose dense fashion x4 (04/14/16 - 05/26/16) followed by Paclitaxel and Carboplatin weekly x12 (started 06/09/16). Chemotherapy was discontinued after 7 cycles due to neuropathy with the last dose on 07/21/16.  MRI of the bilateral breasts on 08/10/16 showed a 1.4 x 1.8 x 1.9 cm mass in the right breast that previously measured 3.0 x 2.7 x 3.0 cm.  The patient underwent a right mastectomy and right axillary sentinel lymph node biopsies on 09/06/16 by Dr. [name]. This revealed grade 3 invasive ductal carcinoma (2 cm) with 1 out of 2 right axillary sentinel lymph nodes positive for metastasis.  The patient followed up with Dr. [name] on 10/04/16 who discussed that the patient might benefit from [place] and that she may want to participate in the SWOG immunotherapy trial.   The patient presents today to discuss the role of radiation in the management of her disease.  Of note, the patient's genetic tests (Gene[employer]) results were negative and no VUSes were found.  On review of systems: The patient reports pain in her right chest from surgery and neuropathy in her fingers. She also reports  right arm pain with occasional sharp shooting pain. The patient reports minimal swelling of the right arm. The patient is taking Tramadol for the pain.  ALLERGIES:  has No Known Allergies.  Meds: Current Outpatient Prescriptions  Medication Sig Dispense Refill  . LORazepam (ATIVAN) 0.5 MG tablet Take 1 tablet (0.5 mg total) by mouth at bedtime. 30 tablet 0  . omeprazole (PRILOSEC) 20 MG capsule Take 1 capsule (20 mg total) by mouth daily. 30 capsule 3  . traMADol (ULTRAM) 50 MG tablet Take 1 tablet (50 mg total) by mouth every 6 (six) hours as needed. 60 tablet 2  . capecitabine (XELODA) 500 MG tablet Take 2 tablets (1,000 mg total) by mouth 2 (two) times daily after a meal. (Patient not taking: Reported on 11/02/2016) 40 tablet 1  . carvedilol (COREG) 3.125 MG tablet Take 1 tablet (3.125 mg total) by mouth 2 (two) times daily with a meal. (Patient not taking: Reported on 11/02/2016) 60 tablet 6  . ketoconazole (NIZORAL) 2 % cream Apply 1 application topically daily. (Patient not taking: Reported on 11/02/2016) 15 g 0  . losartan (COZAAR) 25 MG tablet Take 0.5 tablets (12.5 mg total) by mouth daily. (Patient not taking: Reported on 11/02/2016) 45 tablet 3  . potassium chloride (MICRO-K) 10 MEQ CR capsule Take 1 capsule (10 mEq total) by mouth daily. (Patient not taking: Reported on 11/02/2016) 30 capsule 3  . venlafaxine XR (EFFEXOR-XR) 75 MG 24 hr capsule Take 1 capsule (75 mg total) by mouth daily with breakfast. (Patient not taking: Reported on 11/02/2016) 30 capsule 6   No  current facility-administered medications for this encounter.     Physical Findings: The patient is in no acute distress. Patient is alert and oriented.  height is 5' 1.5" (1.562 m) and weight is 183 lb (83 kg). Her oral temperature is 98.1 F (36.7 C). Her blood pressure is 126/82 and her pulse is 82. Her oxygen saturation is 100%.   Lungs are clear to auscultation bilaterally. Heart has regular rate and rhythm. No  palpable cervical, supraclavicular, or axillary adenopathy. Breast exam: Left breast large and pendulous without nipple bleeding/discharge or a palpable mass. Right chest shows a mastectomy scar that has healed well without signs of infection or drainage.  Lab Findings: Lab Results  Component Value Date   WBC 11.2 (H) 10/04/2016   HGB 10.4 (L) 10/04/2016   HCT 31.3 (L) 10/04/2016   MCV 95.7 10/04/2016   PLT 320 10/04/2016    Radiographic Findings: No results found.  Impression: (ypT1c, ypN1a) grade 3 invasive ductal carcinoma of the right breast (triple negative)  I spoke to the patient today regarding her diagnosis and options for treatment. We discussed the equivalence in terms of survival and local failure between mastectomy and breast conservation. We discussed the role of radiation in decreasing local failures in patients who undergo a mastectomy and have risk factors for recurrence, considering the patient has a positive right axillary lymph node. We discussed the process of CT simulation and the placement tattoos. We discussed 6 weeks of treatment as an outpatient. We discussed the possibility of asymptomatic lung damage. We discussed the low likelihood of secondary malignancies. We discussed the possible side effects including but not limited to skin redness, fatigue, permanent skin darkening, lymphedema and chest wall swelling. We discussed increased complications that can occur with reconstruction after radiation.  Plan: The patient is scheduled for CT simulation on 11/03/16 at 1PM. Of note, the patient is scheduled to start radiosensitizing Xeloda the same day she begins radiation. ____________________________________ -----------------------------------  Blair Promise, PhD, MD  This document serves as a record of services personally performed by Gery Pray, MD. It was created on his behalf by Darcus Austin, a trained medical scribe. The creation of this record is based on the  scribe's personal observations and the provider's statements to them. This document has been checked and approved by the attending provider.

## 2016-11-02 NOTE — Progress Notes (Signed)
Please see the Nurse Progress Note in the MD Initial Consult Encounter for this patient. 

## 2016-11-03 ENCOUNTER — Ambulatory Visit
Admission: RE | Admit: 2016-11-03 | Discharge: 2016-11-03 | Disposition: A | Discharge status: Home / Self Care | Payer: Medicaid Other | Source: Ambulatory Visit | Attending: Radiation Oncology | Admitting: Radiation Oncology

## 2016-11-03 DIAGNOSIS — Z510 Encounter for antineoplastic radiation therapy: Secondary | ICD-10-CM | POA: Diagnosis not present

## 2016-11-03 DIAGNOSIS — C50411 Malignant neoplasm of upper-outer quadrant of right female breast: Secondary | ICD-10-CM

## 2016-11-03 DIAGNOSIS — Z171 Estrogen receptor negative status [ER-]: Principal | ICD-10-CM

## 2016-11-05 NOTE — Progress Notes (Signed)
  Radiation Oncology         [phone] ________________________________  Name: Rebecca [name] MRN: [id number]  Date: 11/03/2016  DOB: [birth year]  SIMULATION AND TREATMENT PLANNING NOTE    ICD-9-CM ICD-10-CM   1. Malignant neoplasm of upper-outer quadrant of right breast in female, estrogen receptor negative (HCC) 174.4 C50.411    V86.1 Z17.1     DIAGNOSIS: (ypT1c, ypN1a) grade 3 invasive ductal carcinoma of the right breast (triple negative)    NARRATIVE:  The patient was brought to the [facility].  Identity was confirmed.  All relevant records and images related to the planned course of therapy were reviewed.  The patient freely provided informed written consent to proceed with treatment after reviewing the details related to the planned course of therapy. The consent form was witnessed and verified by the simulation staff.  Then, the patient was set-up in a stable reproducible  supine position for radiation therapy.  CT images were obtained.  Surface markings were placed.  The CT images were loaded into the planning software.  Then the target and avoidance structures were contoured.  Treatment planning then occurred.  The radiation prescription was entered and confirmed.  Then, I designed and supervised the construction of a total of 5 medically necessary complex treatment devices.  I have requested : 3D Simulation  I have requested a DVH of the following structures: heart, lungs, spinal cord, esophagus.  I have ordered:dose calc.  PLAN:  The patient will receive 45 Gy in 25 fractions Directed at the right chest wall axillary and supraclavicular region. Patient will then proceed with a boost to the mastectomy scar region of 14.4 gray for a cumulative dose of 59.4 gray.  Patient will also receive radiosensitizing Xeloda chemotherapy during the course for treatment.      ______________________  Special Treatment Procedure Note: The patient will be receiving radiosensitizing  chemotherapy. Given the potential of increased toxicities related to combined therapy and the necessity for close monitoring of the patient and blood work, this constitutes a special treatment procedure.   -----------------------------------  Blair Promise, PhD, MD

## 2016-11-08 ENCOUNTER — Telehealth: Payer: Self-pay | Admitting: Oncology

## 2016-11-08 NOTE — Telephone Encounter (Signed)
lvm to inform pt of 12/13 appt. Pt may need to r/s due to radonc appt at 12 pm

## 2016-11-09 ENCOUNTER — Encounter: Payer: Self-pay | Admitting: *Deleted

## 2016-11-09 NOTE — Progress Notes (Signed)
[facility] Psychosocial Distress Screening Clinical Social Work  Clinical Social Work was referred by distress screening protocol.  The patient scored a 7 on the Psychosocial Distress Thermometer which indicates severe distress. Clinical Social Worker reviewed chart and phoned pt to assess for distress and other psychosocial needs. CSW has worked with pt in the past and is very aware of some of pt's hardships. Pt has several children and some have special needs. She continues to be concerned for their well-being and experiences stress as a result. Pt requests assistance with transportation for during radiation. She currently uses the bus or gets a ride from a friend for appointments. Pt has medicaid and is hopeful to use SCAT during radiation. CSW will assist pt with SCAT application. Pt is having housing issues due to mold and plans to contact [employer] for assistance. CSW will assist as well as needed and has found this resource to be very helpful in the past. Pt reports she is still somewhat depressed and stopped taking Effexor as she though it made her feel "bad". CSW encouraged her to share those concerns with MD as another medication could work better for her. Pt feel the ativan works well for her anxiety. CSW encouraged pt to consider support through [employer] team or counseling intern as well or other support programs. CSW team to meet with pt next week and complete SCAT. CSW team to follow.   ONCBCN DISTRESS SCREENING 11/02/2016  Screening Type Initial Screening  Distress experienced in past week (1-10) 7  Practical problem type Housing;Work/school;Transportation  Family Problem type Children  Emotional problem type Depression;Nervousness/Anxiety;Adjusting to illness;Isolation/feeling alone;Feeling hopeless;Boredom;Adjusting to appearance changes  Physical Problem type Pain;Sleep/insomnia;Loss of appetitie;Talking;Tingling hands/feet;Skin dry/itchy  Physician notified of physical symptoms    Referral to clinical social work   Referral to financial advocate   Referral to support programs     Clinical Social Worker follow up needed: Yes.    If yes, follow up plan: See above Loren Racer, [place] Worker [place]  [facility] Phone: [phone] Fax: [phone]

## 2016-11-10 DIAGNOSIS — Z510 Encounter for antineoplastic radiation therapy: Secondary | ICD-10-CM | POA: Diagnosis not present

## 2016-11-11 ENCOUNTER — Ambulatory Visit: Payer: Medicaid Other | Admitting: Radiation Oncology

## 2016-11-14 ENCOUNTER — Ambulatory Visit
Admission: RE | Admit: 2016-11-14 | Discharge: 2016-11-14 | Disposition: A | Discharge status: Home / Self Care | Payer: Medicaid Other | Source: Ambulatory Visit | Attending: Radiation Oncology | Admitting: Radiation Oncology

## 2016-11-14 DIAGNOSIS — Z510 Encounter for antineoplastic radiation therapy: Secondary | ICD-10-CM | POA: Diagnosis not present

## 2016-11-15 ENCOUNTER — Ambulatory Visit
Admission: RE | Admit: 2016-11-15 | Discharge: 2016-11-15 | Disposition: A | Discharge status: Home / Self Care | Payer: Medicaid Other | Source: Ambulatory Visit | Attending: Radiation Oncology | Admitting: Radiation Oncology

## 2016-11-15 ENCOUNTER — Encounter: Payer: Self-pay | Admitting: Radiation Oncology

## 2016-11-15 VITALS — BP 110/80 | HR 92 | Temp 98.9°F | Resp 18 | Ht 61.5 in | Wt 183.8 lb

## 2016-11-15 DIAGNOSIS — C50411 Malignant neoplasm of upper-outer quadrant of right female breast: Secondary | ICD-10-CM | POA: Insufficient documentation

## 2016-11-15 DIAGNOSIS — Z510 Encounter for antineoplastic radiation therapy: Secondary | ICD-10-CM | POA: Diagnosis not present

## 2016-11-15 MED ORDER — ALRA NON-METALLIC DEODORANT (RAD-ONC)
1.0000 "application " | Freq: Once | TOPICAL | Status: AC
  Administered 2016-11-15: 1 via TOPICAL

## 2016-11-15 MED ORDER — RADIAPLEXRX EX GEL
Freq: Once | CUTANEOUS | Status: AC
  Administered 2016-11-15: 16:00:00 via TOPICAL

## 2016-11-15 NOTE — Progress Notes (Signed)
Pt here for patient teaching.  Pt given Radiation and You booklet, Managing Acute Radiation Side Effects for Head and Neck Cancer handout, skin care instructions, Alra deodorant and Radiaplex gel. Pt reports they have not watched the Radiation Therapy Education video and has been given the link to watch at home.  Reviewed areas of pertinence such as fatigue, skin changes, breast tenderness and breast swelling . Pt able to give teach back of to pat skin and use unscented/gentle soap,apply Radiaplex bid and avoid applying anything to skin within 4 hours of treatment. Pt demonstrated understanding and verbalizes understanding of information given and will contact nursing with any questions or concerns.

## 2016-11-15 NOTE — Progress Notes (Addendum)
  Rebecca [name] has received 1 radiation treatment to [facility].  Skin with normal color and intact.  Given Radiaplex gel and Alra deodorant with instructions.  Patient teaching reviewed  areas of pertinence such as fatigue, hair loss, skin changes, breast tenderness, breast swelling.  Pt able to give teach back of to pat skin, use unscented/gentle soap, and drink plenty of water,apply Radiaplex bid, avoid applying anything to skin within 4 hours of treatment, avoid wearing an under wire bra and to use an electric razor if they must shave. Pt demonstrated understanding of information given and will contact nursing with any questions or concerns.   Denies any of the mentioned areas of pertinence as concerns.  Reports cold symptoms started about four days ago coughing at times greenish to brown phlegm, taking liquid murcinex over the counter.  Appetite is normal.  Having fatigue at different times during the day.  Having tenderness to the RCW, denies pain.  See documentation under education area of chart. Wt Readings from Last 3 Encounters:  11/15/16 183 lb 12.8 oz (83.4 kg)  11/02/16 183 lb (83 kg)  10/04/16 179 lb 3.2 oz (81.3 kg)  BP 110/80   Pulse 92   Temp 98.9 F (37.2 C) (Oral)   Resp 18   Ht 5' 1.5" (1.562 m)   Wt 183 lb 12.8 oz (83.4 kg)   SpO2 100%   BMI 34.17 kg/m

## 2016-11-15 NOTE — Progress Notes (Signed)
  Radiation Oncology         [phone] ________________________________  Name: Rebecca [name] MRN: [id number]  Date: 11/15/2016  DOB: 24-Sep-1976  Weekly Radiation Therapy Management    ICD-9-CM ICD-10-CM   1. Malignant neoplasm of upper-outer quadrant of right female breast, unspecified estrogen receptor status (HCC) 174.4 C50.411 hyaluronate sodium (RADIAPLEXRX) gel     non-metallic deodorant (ALRA) 1 application     Current Dose: 1.8 Gy     Planned Dose:  45 Gy  Narrative . . . . . . . . The patient presents for routine under treatment assessment.  Patient has received 1 fraction to her right chest wall. She reports having fatigue at different times during the day. Denies pain. Reports cold symptoms started about four days ago coughing at times greenish to brown phlegm, taking liquid murcinex over the counter.  Appetite is normal. She reports having tenderness to the right chest wall.                                    The patient is without complaint.                                 Set-up films were reviewed.                                 The chart was checked. Physical Findings. . .  height is 5' 1.5" (1.562 m) and weight is 183 lb 12.8 oz (83.4 kg). Her oral temperature is 98.9 F (37.2 C). Her blood pressure is 110/80 and her pulse is 92. Her respiration is 18 and oxygen saturation is 100%. . Weight essentially stable.  No significant changes. Mastectomy scar well healed Impression . . . . . . . The patient is tolerating radiation. Plan . . . . . . . . . . . . Continue treatment as planned.  ________________________________   Blair Promise, PhD, MD  This document serves as a record of services personally performed by Gery Pray, MD. It was created on his behalf by Maryla Morrow, a trained medical scribe. The creation of this record is based on the scribe's personal observations and the provider's statements to them. This document has been checked and approved by the  attending provider.

## 2016-11-16 ENCOUNTER — Ambulatory Visit: Payer: Medicaid Other | Admitting: Oncology

## 2016-11-16 ENCOUNTER — Ambulatory Visit: Admission: RE | Admit: 2016-11-16 | Payer: Medicaid Other | Source: Ambulatory Visit

## 2016-11-17 ENCOUNTER — Ambulatory Visit
Admission: RE | Admit: 2016-11-17 | Discharge: 2016-11-17 | Disposition: A | Discharge status: Home / Self Care | Payer: Medicaid Other | Source: Ambulatory Visit | Attending: Radiation Oncology | Admitting: Radiation Oncology

## 2016-11-17 ENCOUNTER — Encounter: Payer: Self-pay | Admitting: *Deleted

## 2016-11-17 DIAGNOSIS — Z510 Encounter for antineoplastic radiation therapy: Secondary | ICD-10-CM | POA: Diagnosis not present

## 2016-11-17 NOTE — Progress Notes (Signed)
[place] Work  Clinical Social Work was referred by patient for assistance with transportation.  Clinical Social Worker met with patient at [facility] to offer support and assess for needs. CSW completed SCAT with pt and submitted application to SCAT office for pt to use for transportation.     Clinical Social Work interventions:  Resource assistance  Loren Racer, [place] Worker [place]  [place] Phone: [phone] Fax: [phone]

## 2016-11-18 ENCOUNTER — Encounter: Payer: Self-pay | Admitting: *Deleted

## 2016-11-18 ENCOUNTER — Ambulatory Visit
Admission: RE | Admit: 2016-11-18 | Discharge: 2016-11-18 | Disposition: A | Discharge status: Home / Self Care | Payer: Medicaid Other | Source: Ambulatory Visit | Attending: Radiation Oncology | Admitting: Radiation Oncology

## 2016-11-18 DIAGNOSIS — Z510 Encounter for antineoplastic radiation therapy: Secondary | ICD-10-CM | POA: Diagnosis not present

## 2016-11-18 NOTE — Progress Notes (Signed)
[place] Work  Clinical Social Work was referred by patient for assessment of psychosocial needs.  Clinical Social Worker met with patient at [facility] to offer support and assess for needs.  CSW assisted with SCAT pass at radiation and contacted [employer] on behalf of pt to assist with mold issue in the home. CSW could only leave message at [facility]. CSW to follow and assist.    Loren Racer, [place] Worker [place]  [facility] Phone: [phone] Fax: [phone]

## 2016-11-20 DIAGNOSIS — Z1379 Encounter for other screening for genetic and chromosomal anomalies: Secondary | ICD-10-CM | POA: Insufficient documentation

## 2016-11-20 DIAGNOSIS — Z803 Family history of malignant neoplasm of breast: Secondary | ICD-10-CM | POA: Insufficient documentation

## 2016-11-20 NOTE — Progress Notes (Signed)
GENETIC TEST RESULT  HPI: [name] was previously seen in the [place] clinic due to a personal history of breast cancer at 36, family history of breast and other cancers, and concerns regarding a hereditary predisposition to cancer. Please refer to our prior cancer genetics clinic note from October 04, 2016 for more information regarding Rebecca [name]'s medical, social and family histories, and our assessment and recommendations, at the time. [name] recent genetic test results were disclosed to her, as were recommendations warranted by these results. These results and recommendations are discussed in more detail below.  GENETIC TEST RESULTS: Genetic testing reported out on October 20, 2016 through the 32-gene Comprehensive Cancer Panel (with MSH2 Exons 1-7 Inversion Analysis) found no deleterious mutations.  Additionally, no variants of uncertain significance (VUSes) were found.  The Comprehensive Cancer Panel offered by [facility] Junius Roads, MD) includes sequencing and/or deletion duplication testing of the following 32 genes: APC, ATM, AXIN2, BARD1, BMPR1A, BRCA1, BRCA2, BRIP1, CDH1, CDK4, CDKN2A, CHEK2, EPCAM, FANCC, MLH1, MSH2, MSH6, MUTYH, NBN, PALB2, PMS2, POLD1, POLE, PTEN, RAD51C, RAD51D, SCG5/GREM1, SMAD4, STK11, TP53, VHL, and XRCC2.  The test report will be scanned into EPIC and will be located under the Molecular Pathology section of the Results Review tab.   We discussed with Rebecca [name] that since the current genetic testing is not perfect, it is possible there may be a gene mutation in one of these genes that current testing cannot detect, but that chance is small. We also discussed, that it is possible that another gene that has not yet been discovered, or that we have not yet tested, is responsible for the cancer diagnoses in the family, and it is, therefore, important to remain in touch with cancer genetics in the future so that we can continue to offer Ms.  [name] the most up-to-date genetic testing.   CANCER SCREENING RECOMMENDATIONS: Given Rebecca [name]'s personal and family histories, we must interpret these negative results with some caution.  Families with features suggestive of hereditary risk for cancer tend to have multiple family members with cancer, diagnoses in multiple generations and diagnoses before the age of 73. Rebecca [name]'s family exhibits some of these features. Thus this result may simply reflect our current inability to detect all mutations within these genes or there may be a different gene that has not yet been discovered or tested.  Genetic testing of other affected relatives (especially Rebecca [name]'s maternal uncle and female maternal first cousin who have both had breast cancer) could be helpful for further elucidation of the personal and familial cancer risks.  In the meantime, Rebecca [name] should continue to follow her doctors' recommendations for cancer screening.  RECOMMENDATIONS FOR FAMILY MEMBERS: Men and women in this family might be at some increased risk of developing cancer, over the general population risk, simply due to the family history of cancer. We recommended women in this family have a yearly mammogram beginning at age 16, or 29 years younger than the earliest onset of cancer, an annual clinical breast exam, and perform monthly breast self-exams. [name] daughters and nieces can begin getting annual mammograms at the age of 100 based on her age at breast cancer diagnosis.  Women in this family should also have a gynecological exam as recommended by their primary provider. All family members should have a colonoscopy by age 65.  Based on Rebecca [name]'s family history, we recommended her maternal uncle and female maternal first cousin, who were both diagnosed with breast  cancer, have genetic counseling and testing.  We also recommend that Rebecca [name]'s mother, who was diagnosed with an intestinal cancer in her 61s, have genetic  counseling and testing.  [name] will let us know if we can be of any assistance in coordinating genetic counseling and/or testing for these family members.   FOLLOW-UP: Lastly, we discussed with Rebecca [name] that cancer genetics is a rapidly advancing field and it is possible that new genetic tests will be appropriate for her and/or her family members in the future. We encouraged her to remain in contact with cancer genetics on an annual basis so we can update her personal and family histories and let her know of advances in cancer genetics that may benefit this family.   Our contact number was provided. [name] questions were answered to her satisfaction, and she knows she is welcome to call us at anytime with additional questions or concerns.   Jeanine Luz, MS, [facility] Certified Genetic Counselor [email or web address] Phone: [phone]

## 2016-11-21 ENCOUNTER — Other Ambulatory Visit: Payer: Self-pay | Admitting: Oncology

## 2016-11-21 ENCOUNTER — Ambulatory Visit: Payer: Medicaid Other

## 2016-11-21 NOTE — Progress Notes (Signed)
  Ilea, Rocco Female, 40 y.o., 28-Sep-1976 Weight:  183 lb 12.8 oz (83.4 kg) Phone:  [phone] PCP:  Dorena Dew, FNP MRN:  [id number] MyChart:  Pending Next Appt:  11/22/2016 ABC and BWEL referral  Received: Today  Message Contents  Carola Frost, RN  Chauncey Cruel, MD        Hi Dr. [name],   The patient is not eligible for the ABC study. According to planned radiation schedule, 60 days from the last dose of radiation will go beyond 1 year after diagnosis date.   Also, the patient told me she is not interested in the [place] study.   Thank you for the referrals,  Marcellus Scott, RN

## 2016-11-22 ENCOUNTER — Encounter: Payer: Self-pay | Admitting: Radiation Oncology

## 2016-11-22 ENCOUNTER — Ambulatory Visit
Admission: RE | Admit: 2016-11-22 | Discharge: 2016-11-22 | Disposition: A | Discharge status: Home / Self Care | Payer: Medicaid Other | Source: Ambulatory Visit | Attending: Radiation Oncology | Admitting: Radiation Oncology

## 2016-11-22 VITALS — BP 112/70 | HR 85 | Temp 98.0°F | Ht 61.5 in | Wt 183.4 lb

## 2016-11-22 DIAGNOSIS — C50411 Malignant neoplasm of upper-outer quadrant of right female breast: Secondary | ICD-10-CM

## 2016-11-22 DIAGNOSIS — Z510 Encounter for antineoplastic radiation therapy: Secondary | ICD-10-CM | POA: Diagnosis not present

## 2016-11-22 NOTE — Progress Notes (Signed)
  Radiation Oncology         (336) [phone] ________________________________  Name: Rebecca [name] MRN: [id number]  Date: 11/22/2016  DOB: 1976-06-26  Weekly Radiation Therapy Management    ICD-9-CM ICD-10-CM   1. Malignant neoplasm of upper-outer quadrant of right female breast, unspecified estrogen receptor status (HCC) 174.4 C50.411      Current Dose: 7.2 Gy     Planned Dose:  59.4 Gy  Narrative . . . . . . . . The patient presents for routine under treatment assessment.  The patient has completed 4 fractions to her right chest wall.  She reports having a cold this week with a cough and says she feels better today.  She reports having pain in her right chest wall as a 3/10 which she has had since surgery.  She is using radiaplex.  The skin on her right chest wall has hyperpigmentation.                                    The patient is without complaint.                                 Set-up films were reviewed.                                 The chart was checked. Physical Findings. . .  height is 5' 1.5" (1.562 m) and weight is 183 lb 6.4 oz (83.2 kg). Her oral temperature is 98 F (36.7 C). Her blood pressure is 112/70 and her pulse is 85. Her oxygen saturation is 100%.   Lungs are clear to auscultation bilaterally. Heart has regular rate and rhythm. Right chest wall has mild hyperpigmentation changes. Impression . . . . . . . The patient is tolerating radiation. Plan . . . . . . . . . . . . Continue treatment as planned.  ________________________________   Blair Promise, PhD, MD  This document serves as a record of services personally performed by Gery Pray, MD. It was created on his behalf by Darcus Austin, a trained medical scribe. The creation of this record is based on the scribe's personal observations and the provider's statements to them. This document has been checked and approved by the attending provider.

## 2016-11-22 NOTE — Progress Notes (Signed)
Rebecca [name] has completed 4 fractions to her right chest wall.  She reports having a cold this week with a cough.  She said she feels better today.  She reports having pain in her right chest wall at a 3/10 which she has had since surgery.  She is using radiaplex.  The skin on her right chest wall has hyperpigmentation.  BP 112/70 (BP Location: Left Arm, Patient Position: Sitting)   Pulse 85   Temp 98 F (36.7 C) (Oral)   Ht 5' 1.5" (1.562 m)   Wt 183 lb 6.4 oz (83.2 kg)   SpO2 100%   BMI 34.09 kg/m    Wt Readings from Last 3 Encounters:  11/22/16 183 lb 6.4 oz (83.2 kg)  11/15/16 183 lb 12.8 oz (83.4 kg)  11/02/16 183 lb (83 kg)

## 2016-11-23 ENCOUNTER — Ambulatory Visit
Admission: RE | Admit: 2016-11-23 | Discharge: 2016-11-23 | Disposition: A | Discharge status: Home / Self Care | Payer: Medicaid Other | Source: Ambulatory Visit | Attending: Radiation Oncology | Admitting: Radiation Oncology

## 2016-11-23 DIAGNOSIS — Z510 Encounter for antineoplastic radiation therapy: Secondary | ICD-10-CM | POA: Diagnosis not present

## 2016-11-24 ENCOUNTER — Ambulatory Visit: Payer: Medicaid Other

## 2016-11-24 ENCOUNTER — Ambulatory Visit
Admission: RE | Admit: 2016-11-24 | Discharge: 2016-11-24 | Disposition: A | Discharge status: Home / Self Care | Payer: Medicaid Other | Source: Ambulatory Visit | Attending: Radiation Oncology | Admitting: Radiation Oncology

## 2016-11-24 DIAGNOSIS — Z510 Encounter for antineoplastic radiation therapy: Secondary | ICD-10-CM | POA: Diagnosis not present

## 2016-11-25 ENCOUNTER — Ambulatory Visit
Admission: RE | Admit: 2016-11-25 | Discharge: 2016-11-25 | Disposition: A | Discharge status: Home / Self Care | Payer: Medicaid Other | Source: Ambulatory Visit | Attending: Radiation Oncology | Admitting: Radiation Oncology

## 2016-11-25 DIAGNOSIS — Z510 Encounter for antineoplastic radiation therapy: Secondary | ICD-10-CM | POA: Diagnosis not present

## 2016-11-29 ENCOUNTER — Encounter: Payer: Self-pay | Admitting: Radiation Oncology

## 2016-11-29 ENCOUNTER — Ambulatory Visit
Admission: RE | Admit: 2016-11-29 | Discharge: 2016-11-29 | Disposition: A | Discharge status: Home / Self Care | Payer: Medicaid Other | Source: Ambulatory Visit | Attending: Radiation Oncology | Admitting: Radiation Oncology

## 2016-11-29 VITALS — BP 119/84 | HR 86 | Temp 98.3°F | Wt 184.8 lb

## 2016-11-29 DIAGNOSIS — Z510 Encounter for antineoplastic radiation therapy: Secondary | ICD-10-CM | POA: Diagnosis not present

## 2016-11-29 DIAGNOSIS — C50411 Malignant neoplasm of upper-outer quadrant of right female breast: Secondary | ICD-10-CM

## 2016-11-29 DIAGNOSIS — Z171 Estrogen receptor negative status [ER-]: Principal | ICD-10-CM

## 2016-11-29 NOTE — Progress Notes (Signed)
[name] is here for her 8th fraction of radiation to her Right Chest Wall. She denies pain. She does have some fatigue.  Her Right Chest has hyperpigmentation. She is using the Radiaplex twice daily as directed.   BP 119/84   Pulse 86   Temp 98.3 F (36.8 C)   Wt 184 lb 12.8 oz (83.8 kg)   SpO2 100% Comment: room air  BMI 34.35 kg/m    Wt Readings from Last 3 Encounters:  11/29/16 184 lb 12.8 oz (83.8 kg)  11/22/16 183 lb 6.4 oz (83.2 kg)  11/15/16 183 lb 12.8 oz (83.4 kg)

## 2016-11-29 NOTE — Progress Notes (Signed)
   Weekly Management Note:  Outpatient    ICD-9-CM ICD-10-CM   1. Malignant neoplasm of upper-outer quadrant of right breast in female, estrogen receptor negative (HCC) 174.4 C50.411    V86.1 Z17.1     Current Dose:  14.4 Gy  Projected Dose: 59.4 Gy   Narrative:  The patient presents for routine under treatment assessment.  CBCT/MVCT images/Port film x-rays were reviewed.  The chart was checked.   [name] is here for her 8th fraction of radiation to her Right Chest Wall. She denies pain. She does have some fatigue.  Her Right Chest has hyperpigmentation. She is using the Radiaplex twice daily as directed.   Physical Findings:  weight is 184 lb 12.8 oz (83.8 kg). Her temperature is 98.3 F (36.8 C). Her blood pressure is 119/84 and her pulse is 86. Her oxygen saturation is 100%.   Wt Readings from Last 3 Encounters:  11/29/16 184 lb 12.8 oz (83.8 kg)  11/22/16 183 lb 6.4 oz (83.2 kg)  11/15/16 183 lb 12.8 oz (83.4 kg)   Skin slightly hyperpigmented over the right chest.  Impression:  The patient is tolerating radiotherapy.  Plan:  Continue radiotherapy as planned. Encouraged the patient to continue using radiaplex as directed.   ________________________________   Eppie Gibson, M.D.   This document serves as a record of services personally performed by Eppie Gibson, MD. It was created on her behalf by Arlyce Harman, a trained medical scribe. The creation of this record is based on the scribe's personal observations and the provider's statements to them. This document has been checked and approved by the attending provider.

## 2016-11-30 ENCOUNTER — Ambulatory Visit
Admission: RE | Admit: 2016-11-30 | Discharge: 2016-11-30 | Disposition: A | Discharge status: Home / Self Care | Payer: Medicaid Other | Source: Ambulatory Visit | Attending: Radiation Oncology | Admitting: Radiation Oncology

## 2016-11-30 DIAGNOSIS — Z510 Encounter for antineoplastic radiation therapy: Secondary | ICD-10-CM | POA: Diagnosis not present

## 2016-12-01 ENCOUNTER — Ambulatory Visit
Admission: RE | Admit: 2016-12-01 | Discharge: 2016-12-01 | Disposition: A | Discharge status: Home / Self Care | Payer: Medicaid Other | Source: Ambulatory Visit | Attending: Radiation Oncology | Admitting: Radiation Oncology

## 2016-12-01 DIAGNOSIS — Z510 Encounter for antineoplastic radiation therapy: Secondary | ICD-10-CM | POA: Diagnosis not present

## 2016-12-02 ENCOUNTER — Ambulatory Visit: Payer: Medicaid Other

## 2016-12-06 ENCOUNTER — Ambulatory Visit
Admission: RE | Admit: 2016-12-06 | Discharge: 2016-12-06 | Disposition: A | Discharge status: Home / Self Care | Payer: Medicaid Other | Source: Ambulatory Visit | Attending: Radiation Oncology | Admitting: Radiation Oncology

## 2016-12-06 ENCOUNTER — Encounter: Payer: Self-pay | Admitting: Radiation Oncology

## 2016-12-06 VITALS — BP 116/80 | HR 89 | Temp 98.2°F | Ht 61.5 in | Wt 181.2 lb

## 2016-12-06 DIAGNOSIS — C50411 Malignant neoplasm of upper-outer quadrant of right female breast: Secondary | ICD-10-CM

## 2016-12-06 DIAGNOSIS — Z171 Estrogen receptor negative status [ER-]: Principal | ICD-10-CM

## 2016-12-06 DIAGNOSIS — Z510 Encounter for antineoplastic radiation therapy: Secondary | ICD-10-CM | POA: Diagnosis not present

## 2016-12-06 NOTE — Progress Notes (Signed)
Rebecca [name] has completed 10 fractions to her right chest wall.  She denies having pain.  She reports having fatigue.  She is not taking Xeloda.  She is using radiaplex.  The skin on her right chest wall has hyperpigmentation.  BP 116/80 (BP Location: Left Arm, Patient Position: Sitting)   Pulse 89   Temp 98.2 F (36.8 C) (Oral)   Ht 5' 1.5" (1.562 m)   Wt 181 lb 3.2 oz (82.2 kg)   SpO2 100%   BMI 33.68 kg/m    Wt Readings from Last 3 Encounters:  12/06/16 181 lb 3.2 oz (82.2 kg)  11/29/16 184 lb 12.8 oz (83.8 kg)  11/22/16 183 lb 6.4 oz (83.2 kg)

## 2016-12-06 NOTE — Progress Notes (Signed)
  Radiation Oncology         [phone]) [phone] ________________________________  Name: Rebecca [name] MRN: [id number]  Date: 12/06/2016  DOB: [birth year]  Weekly Radiation Therapy Management    ICD-9-CM ICD-10-CM   1. Malignant neoplasm of upper-outer quadrant of right breast in female, estrogen receptor negative (HCC) 174.4 C50.411    V86.1 Z17.1      Current Dose: 19.8 Gy     Planned Dose:  59.4 Gy  Narrative . . . . . . . . The patient presents for routine under treatment assessment.  Rebecca [name] has completed 11 fractions to her right chest wall.  She denies having pain, but reports pruritus. She reports having fatigue. She is not taking Xeloda because it is making her nauseous.  She is using radiaplex. The skin on her right chest wall has hyperpigmentation.                                  Set-up films were reviewed.                                 The chart was checked. Physical Findings. . .  height is 5' 1.5" (1.562 m) and weight is 181 lb 3.2 oz (82.2 kg). Her oral temperature is 98.2 F (36.8 C). Her blood pressure is 116/80 and her pulse is 89. Her oxygen saturation is 100%.   Lungs are clear to auscultation bilaterally. Heart has regular rate and rhythm. Right chest wall has hyperpigmentation changes. Impression . . . . . . . The patient is tolerating radiation. Plan . . . . . . . . . . . . Continue treatment as planned.  ________________________________   Blair Promise, PhD, MD  This document serves as a record of services personally performed by Gery Pray, MD. It was created on his behalf by Darcus Austin, a trained medical scribe. The creation of this record is based on the scribe's personal observations and the provider's statements to them. This document has been checked and approved by the attending provider.

## 2016-12-07 ENCOUNTER — Ambulatory Visit: Payer: Medicaid Other

## 2016-12-08 ENCOUNTER — Ambulatory Visit
Admission: RE | Admit: 2016-12-08 | Discharge: 2016-12-08 | Disposition: A | Discharge status: Home / Self Care | Payer: Medicaid Other | Source: Ambulatory Visit | Attending: Radiation Oncology | Admitting: Radiation Oncology

## 2016-12-08 DIAGNOSIS — Z510 Encounter for antineoplastic radiation therapy: Secondary | ICD-10-CM | POA: Diagnosis not present

## 2016-12-09 ENCOUNTER — Ambulatory Visit
Admission: RE | Admit: 2016-12-09 | Discharge: 2016-12-09 | Disposition: A | Discharge status: Home / Self Care | Payer: Medicaid Other | Source: Ambulatory Visit | Attending: Radiation Oncology | Admitting: Radiation Oncology

## 2016-12-09 DIAGNOSIS — Z510 Encounter for antineoplastic radiation therapy: Secondary | ICD-10-CM | POA: Diagnosis not present

## 2016-12-12 ENCOUNTER — Ambulatory Visit: Payer: Medicaid Other

## 2016-12-13 ENCOUNTER — Ambulatory Visit
Admission: RE | Admit: 2016-12-13 | Discharge: 2016-12-13 | Disposition: A | Discharge status: Home / Self Care | Payer: Medicaid Other | Source: Ambulatory Visit | Attending: Radiation Oncology | Admitting: Radiation Oncology

## 2016-12-13 ENCOUNTER — Encounter: Payer: Self-pay | Admitting: Radiation Oncology

## 2016-12-13 VITALS — BP 117/77 | HR 89 | Temp 98.0°F | Ht 61.5 in | Wt 183.6 lb

## 2016-12-13 DIAGNOSIS — Z510 Encounter for antineoplastic radiation therapy: Secondary | ICD-10-CM | POA: Diagnosis not present

## 2016-12-13 DIAGNOSIS — C50411 Malignant neoplasm of upper-outer quadrant of right female breast: Secondary | ICD-10-CM

## 2016-12-13 DIAGNOSIS — Z171 Estrogen receptor negative status [ER-]: Principal | ICD-10-CM

## 2016-12-13 NOTE — Progress Notes (Signed)
Rebecca [name] has completed 14 fractions to her right chest wall.  She continues to report having pain at a 3/10 in her right chest wall.  She reports having fatigue and said she is having trouble sleeping at night due to hot and cold flashes.  She is using radiaplex.  The skin on her right chest has hyperpigmentation.  BP 117/77 (BP Location: Left Arm, Patient Position: Sitting)   Pulse 89   Temp 98 F (36.7 C) (Oral)   Ht 5' 1.5" (1.562 m)   Wt 183 lb 9.6 oz (83.3 kg)   SpO2 100%   BMI 34.13 kg/m   Wt Readings from Last 3 Encounters:  12/13/16 183 lb 9.6 oz (83.3 kg)  12/06/16 181 lb 3.2 oz (82.2 kg)  11/29/16 184 lb 12.8 oz (83.8 kg)

## 2016-12-13 NOTE — Progress Notes (Signed)
  Radiation Oncology         [phone] ________________________________  Name: Rebecca [name] MRN: [id number]  Date: 12/13/2016  DOB: 1976-05-07  Weekly Radiation Therapy Management    ICD-9-CM ICD-10-CM   1. Malignant neoplasm of upper-outer quadrant of right breast in female, estrogen receptor negative (HCC) 174.4 C50.411    V86.1 Z17.1      Current Dose: 25.2 Gy     Planned Dose:  59.4 Gy  Narrative . . . . . . . . The patient presents for routine under treatment assessment.     Rebecca [name] has completed 14 fractions to her right chest wall.  She continues to report pain as a 3/10 in her right chest wall.  She reports having fatigue and said she is having trouble sleeping at night due to hot and cold flashes. She is using radiaplex.  The skin on her right chest has hyperpigmentation.                                  Set-up films were reviewed.                                 The chart was checked. Physical Findings. . .  height is 5' 1.5" (1.562 m) and weight is 183 lb 9.6 oz (83.3 kg). Her oral temperature is 98 F (36.7 C). Her blood pressure is 117/77 and her pulse is 89. Her oxygen saturation is 100%.   Lungs are clear to auscultation bilaterally. Heart has regular rate and rhythm. Right chest wall has hyperpigmentation changes. No moist desquamation or skin breakdown. Impression . . . . . . . The patient is tolerating radiation. Plan . . . . . . . . . . . . Continue treatment as planned. ________________________________   Blair Promise, PhD, MD  This document serves as a record of services personally performed by Gery Pray, MD. It was created on his behalf by Darcus Austin, a trained medical scribe. The creation of this record is based on the scribe's personal observations and the provider's statements to them. This document has been checked and approved by the attending provider.

## 2016-12-13 NOTE — Progress Notes (Signed)
  Radiation Oncology         [phone] ________________________________  Name: Rebecca [name] MRN: [id number]  Date: 12/13/2016  DOB: 1975/12/19  Electron beam Simulation Verification Note    ICD-9-CM ICD-10-CM   1. Malignant neoplasm of upper-outer quadrant of right breast in female, estrogen receptor negative ([place]) 174.4 C50.411    V86.1 Z17.1     Status: outpatient  NARRATIVE: The patient was brought to the treatment unit and placed in the planned treatment position. The clinical setup was verified. Then port films were obtained and uploaded to the radiation oncology medical record software.  The treatment beams were carefully compared against the planned radiation fields. The position location and shape of the radiation fields was reviewed. They targeted volume of tissue appears to be appropriately covered by the radiation beams. Organs at risk appear to be excluded as planned.  Based on my personal review, I approved the simulation verification. The patient's treatment will proceed as planned.  -----------------------------------  Blair Promise, PhD, MD

## 2016-12-14 ENCOUNTER — Ambulatory Visit
Admission: RE | Admit: 2016-12-14 | Discharge: 2016-12-14 | Disposition: A | Discharge status: Home / Self Care | Payer: Medicaid Other | Source: Ambulatory Visit | Attending: Radiation Oncology | Admitting: Radiation Oncology

## 2016-12-14 DIAGNOSIS — Z510 Encounter for antineoplastic radiation therapy: Secondary | ICD-10-CM | POA: Diagnosis not present

## 2016-12-15 ENCOUNTER — Ambulatory Visit
Admission: RE | Admit: 2016-12-15 | Discharge: 2016-12-15 | Disposition: A | Discharge status: Home / Self Care | Payer: Medicaid Other | Source: Ambulatory Visit | Attending: Radiation Oncology | Admitting: Radiation Oncology

## 2016-12-15 DIAGNOSIS — Z510 Encounter for antineoplastic radiation therapy: Secondary | ICD-10-CM | POA: Diagnosis not present

## 2016-12-16 ENCOUNTER — Ambulatory Visit
Admission: RE | Admit: 2016-12-16 | Discharge: 2016-12-16 | Disposition: A | Discharge status: Home / Self Care | Payer: Medicaid Other | Source: Ambulatory Visit | Attending: Radiation Oncology | Admitting: Radiation Oncology

## 2016-12-16 DIAGNOSIS — Z510 Encounter for antineoplastic radiation therapy: Secondary | ICD-10-CM | POA: Diagnosis not present

## 2016-12-19 ENCOUNTER — Ambulatory Visit
Admission: RE | Admit: 2016-12-19 | Discharge: 2016-12-19 | Disposition: A | Discharge status: Home / Self Care | Payer: Medicaid Other | Source: Ambulatory Visit | Attending: Radiation Oncology | Admitting: Radiation Oncology

## 2016-12-19 DIAGNOSIS — Z510 Encounter for antineoplastic radiation therapy: Secondary | ICD-10-CM | POA: Diagnosis not present

## 2016-12-20 ENCOUNTER — Ambulatory Visit: Payer: Medicaid Other | Admitting: Radiation Oncology

## 2016-12-20 ENCOUNTER — Ambulatory Visit
Admission: RE | Admit: 2016-12-20 | Discharge: 2016-12-20 | Disposition: A | Discharge status: Home / Self Care | Payer: Medicaid Other | Source: Ambulatory Visit | Attending: Radiation Oncology | Admitting: Radiation Oncology

## 2016-12-20 ENCOUNTER — Ambulatory Visit: Admission: RE | Admit: 2016-12-20 | Payer: Medicaid Other | Source: Ambulatory Visit

## 2016-12-21 ENCOUNTER — Ambulatory Visit: Payer: Medicaid Other

## 2016-12-22 ENCOUNTER — Encounter: Payer: Self-pay | Admitting: Radiation Oncology

## 2016-12-22 ENCOUNTER — Ambulatory Visit: Payer: Medicaid Other

## 2016-12-22 ENCOUNTER — Ambulatory Visit
Admission: RE | Admit: 2016-12-22 | Discharge: 2016-12-22 | Disposition: A | Discharge status: Home / Self Care | Payer: Medicaid Other | Source: Ambulatory Visit | Attending: Radiation Oncology | Admitting: Radiation Oncology

## 2016-12-22 VITALS — BP 114/83 | HR 75 | Temp 97.8°F | Ht 61.5 in | Wt 184.4 lb

## 2016-12-22 DIAGNOSIS — Z510 Encounter for antineoplastic radiation therapy: Secondary | ICD-10-CM | POA: Diagnosis not present

## 2016-12-22 DIAGNOSIS — C50411 Malignant neoplasm of upper-outer quadrant of right female breast: Secondary | ICD-10-CM

## 2016-12-22 DIAGNOSIS — Z171 Estrogen receptor negative status [ER-]: Principal | ICD-10-CM

## 2016-12-22 MED ORDER — LORAZEPAM 0.5 MG PO TABS: 0.5000 mg | ORAL_TABLET | Freq: Every day | ORAL | 0 refills | Status: DC

## 2016-12-22 MED ORDER — OMEPRAZOLE 20 MG PO CPDR: 20.0000 mg | DELAYED_RELEASE_CAPSULE | Freq: Every day | ORAL | 3 refills | Status: DC

## 2016-12-22 MED ORDER — TRAMADOL HCL 50 MG PO TABS: 50.0000 mg | ORAL_TABLET | Freq: Four times a day (QID) | ORAL | 2 refills | Status: DC | PRN

## 2016-12-22 NOTE — Progress Notes (Signed)
Rebecca [name] has completed 19 fractions to her right chest wall.  She is having pain under her right arm that she is rating at a 5/10.  She will be given hydrogel pads to try.  She is taking tramadol, ativan and Prilosec and has requested refills.  She reports feeling fatigued.  She is using radiaplex gel.  The skin on her right chest wall has hyperpigmentation.  BP 114/83 (BP Location: Left Arm, Patient Position: Sitting)   Pulse 75   Temp 97.8 F (36.6 C) (Oral)   Ht 5' 1.5" (1.562 m)   Wt 184 lb 6.4 oz (83.6 kg)   SpO2 100%   BMI 34.28 kg/m    Wt Readings from Last 3 Encounters:  12/22/16 184 lb 6.4 oz (83.6 kg)  12/13/16 183 lb 9.6 oz (83.3 kg)  12/06/16 181 lb 3.2 oz (82.2 kg)

## 2016-12-22 NOTE — Progress Notes (Signed)
  Radiation Oncology         [phone] ________________________________  Name: Rebecca [name] MRN: [id number]  Date: 12/22/2016  DOB: Aug 05, 1976  Weekly Radiation Therapy Management    ICD-9-CM ICD-10-CM   1. Malignant neoplasm of upper-outer quadrant of right breast in female, estrogen receptor negative (HCC) 174.4 C50.411 LORazepam (ATIVAN) 0.5 MG tablet   V86.1 Z17.1      Current Dose: 34.2 Gy     Planned Dose:  59.4 Gy  Narrative . . . . . . . . The patient presents for routine under treatment assessment.                 Rebecca [name] has completed 19 fractions to her right chest wall. She is having pain under her right arm that she is rating as a 5/10. She was given  hydrogel pads to try by the nurse. She is taking tramadol, ativan, and prilosec and has requested refills. She reports fatigue. She is using radiaplex gel. The nurse notes the skin on her right chest wall has hyperpigmentation.                                  Set-up films were reviewed.                                 The chart was checked. Physical Findings. . .  height is 5' 1.5" (1.562 m) and weight is 184 lb 6.4 oz (83.6 kg). Her oral temperature is 97.8 F (36.6 C). Her blood pressure is 114/83 and her pulse is 75. Her oxygen saturation is 100%.   Lungs are clear to auscultation bilaterally. Heart has regular rate and rhythm. Right chest wall shows hyperpigmentation changes. No skin breakdown. Sensitive to touch to the right lateral chest wall, no sign of infection, some infection Impression . . . . . . . The patient is tolerating radiation. Plan . . . . . . . . . . . . Continue treatment as planned. I will refill her Prilosec, tramadol and ativan. Tramadol.________________________________   Blair Promise, PhD, MD  This document serves as a record of services personally performed by Gery Pray, MD. It was created on his behalf by Darcus Austin, a trained medical scribe. The creation of this record is based on the  scribe's personal observations and the provider's statements to them. This document has been checked and approved by the attending provider.

## 2016-12-23 ENCOUNTER — Ambulatory Visit: Payer: Medicaid Other

## 2016-12-23 ENCOUNTER — Ambulatory Visit
Admission: RE | Admit: 2016-12-23 | Discharge: 2016-12-23 | Disposition: A | Discharge status: Home / Self Care | Payer: Medicaid Other | Source: Ambulatory Visit | Attending: Radiation Oncology | Admitting: Radiation Oncology

## 2016-12-23 DIAGNOSIS — Z510 Encounter for antineoplastic radiation therapy: Secondary | ICD-10-CM | POA: Diagnosis not present

## 2016-12-26 ENCOUNTER — Ambulatory Visit
Admission: RE | Admit: 2016-12-26 | Discharge: 2016-12-26 | Disposition: A | Discharge status: Home / Self Care | Payer: Medicaid Other | Source: Ambulatory Visit | Attending: Radiation Oncology | Admitting: Radiation Oncology

## 2016-12-26 DIAGNOSIS — Z510 Encounter for antineoplastic radiation therapy: Secondary | ICD-10-CM | POA: Diagnosis not present

## 2016-12-27 ENCOUNTER — Encounter: Payer: Self-pay | Admitting: Radiation Oncology

## 2016-12-27 ENCOUNTER — Ambulatory Visit
Admission: RE | Admit: 2016-12-27 | Discharge: 2016-12-27 | Disposition: A | Discharge status: Home / Self Care | Payer: Medicaid Other | Source: Ambulatory Visit | Attending: Radiation Oncology | Admitting: Radiation Oncology

## 2016-12-27 VITALS — BP 133/91 | HR 89 | Temp 98.3°F | Resp 18 | Ht 61.5 in | Wt 188.0 lb

## 2016-12-27 DIAGNOSIS — Z510 Encounter for antineoplastic radiation therapy: Secondary | ICD-10-CM | POA: Diagnosis not present

## 2016-12-27 DIAGNOSIS — C50411 Malignant neoplasm of upper-outer quadrant of right female breast: Secondary | ICD-10-CM

## 2016-12-27 DIAGNOSIS — Z171 Estrogen receptor negative status [ER-]: Principal | ICD-10-CM

## 2016-12-27 NOTE — Progress Notes (Signed)
  Radiation Oncology         (336) [phone] ________________________________  Name: Rebecca [name] MRN: [id number]  Date: 12/27/2016  DOB: 07-19-1976  Electron beam Verification Note    ICD-9-CM ICD-10-CM   1. Malignant neoplasm of upper-outer quadrant of right breast in female, estrogen receptor negative ([place]) 174.4 C50.411    V86.1 Z17.1     Status: outpatient  NARRATIVE: The patient was brought to the treatment unit and placed in the planned treatment position. The clinical setup was verified.  Based on my personal review, I approved the simulation verification. The patient's treatment will proceed as planned.  -----------------------------------  Blair Promise, PhD, MD

## 2016-12-27 NOTE — Progress Notes (Signed)
Berania Blumenshine has completed 22 fractions to her right chest wall.  She is having pain under her right arm that she is rating at a 5/10.  She is using hydrogel pads under her right arm .  She is taking tramadol.  She reports feeling fatigued most of the day. She is using radiaplex gel.  The skin on her right chest wall has hyperpigmentation. Wt Readings from Last 3 Encounters:  12/27/16 188 lb (85.3 kg)  12/22/16 184 lb 6.4 oz (83.6 kg)  12/13/16 183 lb 9.6 oz (83.3 kg)  BP (!) 133/91   Pulse 89   Temp 98.3 F (36.8 C) (Oral)   Resp 18   Ht 5' 1.5" (1.562 m)   Wt 188 lb (85.3 kg)   SpO2 100%   BMI 34.95 kg/m

## 2016-12-27 NOTE — Progress Notes (Signed)
  Radiation Oncology         (336) [phone] ________________________________  Name: Rebecca [name] MRN: [id number]  Date: 12/27/2016  DOB: 1976-01-04  Weekly Radiation Therapy Management    ICD-9-CM ICD-10-CM   1. Malignant neoplasm of upper-outer quadrant of right breast in female, estrogen receptor negative (HCC) 174.4 C50.411    V86.1 Z17.1      Current Dose: 39.6 Gy     Planned Dose:  59.4 Gy  Narrative . . . . . . . . The patient presents for routine under treatment assessment.                Ziomara Tringali has completed 22 fractions to her right chest wall. She reports pain under her right arm as a 5/10. She is using hydrogel pads under her right arm and is taking tramadol. She takes anywhere from 1 to 3 a day. She reports feeling fatigued most of the day.She is using radiaplex gel and A&D ointment. The skin on her right chest wall has hyperpigmentation.                                  Set-up films were reviewed.                                 The chart was checked. Physical Findings. . . BP (!) 133/91   Pulse 89   Temp 98.3 F (36.8 C) (Oral)   Resp 18   Ht 5' 1.5" (1.562 m)   Wt 188 lb (85.3 kg)   SpO2 100%   BMI 34.95 kg/m  Lungs are clear to auscultation bilaterally. Heart has regular rate and rhythm. Right chest wall shows hyperpigmentation changes. No skin breakdown. Sensitive to touch to the right lateral chest wall, no sign of infection. Impression . . . . . . . The patient is tolerating radiation. Plan . . . . . . . . . . . . Continue treatment as planned. ________________________________   Blair Promise, PhD, MD  This document serves as a record of services personally performed by Gery Pray, MD. It was created on his behalf by Darcus Austin, a trained medical scribe. The creation of this record is based on the scribe's personal observations and the provider's statements to them. This document has been checked and approved by the attending provider.

## 2016-12-28 ENCOUNTER — Ambulatory Visit: Payer: Medicaid Other

## 2016-12-28 ENCOUNTER — Ambulatory Visit
Admission: RE | Admit: 2016-12-28 | Discharge: 2016-12-28 | Disposition: A | Discharge status: Home / Self Care | Payer: Medicaid Other | Source: Ambulatory Visit | Attending: Radiation Oncology | Admitting: Radiation Oncology

## 2016-12-28 DIAGNOSIS — Z510 Encounter for antineoplastic radiation therapy: Secondary | ICD-10-CM | POA: Diagnosis not present

## 2016-12-29 ENCOUNTER — Ambulatory Visit
Admission: RE | Admit: 2016-12-29 | Discharge: 2016-12-29 | Disposition: A | Discharge status: Home / Self Care | Payer: Medicaid Other | Source: Ambulatory Visit | Attending: Radiation Oncology | Admitting: Radiation Oncology

## 2016-12-29 ENCOUNTER — Ambulatory Visit: Payer: Medicaid Other

## 2016-12-29 DIAGNOSIS — Z510 Encounter for antineoplastic radiation therapy: Secondary | ICD-10-CM | POA: Diagnosis not present

## 2016-12-30 ENCOUNTER — Ambulatory Visit: Payer: Medicaid Other

## 2016-12-30 ENCOUNTER — Ambulatory Visit
Admission: RE | Admit: 2016-12-30 | Discharge: 2016-12-30 | Disposition: A | Discharge status: Home / Self Care | Payer: Medicaid Other | Source: Ambulatory Visit | Attending: Radiation Oncology | Admitting: Radiation Oncology

## 2016-12-30 DIAGNOSIS — Z510 Encounter for antineoplastic radiation therapy: Secondary | ICD-10-CM | POA: Diagnosis not present

## 2017-01-02 ENCOUNTER — Ambulatory Visit
Admission: RE | Admit: 2017-01-02 | Discharge: 2017-01-02 | Disposition: A | Discharge status: Home / Self Care | Payer: Medicaid Other | Source: Ambulatory Visit | Attending: Radiation Oncology | Admitting: Radiation Oncology

## 2017-01-02 ENCOUNTER — Ambulatory Visit: Payer: Medicaid Other

## 2017-01-02 DIAGNOSIS — Z510 Encounter for antineoplastic radiation therapy: Secondary | ICD-10-CM | POA: Diagnosis not present

## 2017-01-03 ENCOUNTER — Ambulatory Visit: Payer: Medicaid Other

## 2017-01-03 ENCOUNTER — Ambulatory Visit
Admission: RE | Admit: 2017-01-03 | Discharge: 2017-01-03 | Disposition: A | Discharge status: Home / Self Care | Payer: Medicaid Other | Source: Ambulatory Visit | Attending: Radiation Oncology | Admitting: Radiation Oncology

## 2017-01-03 ENCOUNTER — Encounter: Payer: Self-pay | Admitting: Radiation Oncology

## 2017-01-03 VITALS — BP 115/81 | HR 78 | Temp 97.6°F | Ht 61.5 in | Wt 188.2 lb

## 2017-01-03 DIAGNOSIS — Z171 Estrogen receptor negative status [ER-]: Principal | ICD-10-CM

## 2017-01-03 DIAGNOSIS — Z510 Encounter for antineoplastic radiation therapy: Secondary | ICD-10-CM | POA: Insufficient documentation

## 2017-01-03 DIAGNOSIS — C50411 Malignant neoplasm of upper-outer quadrant of right female breast: Secondary | ICD-10-CM | POA: Diagnosis not present

## 2017-01-03 NOTE — Progress Notes (Signed)
  Radiation Oncology         (336) [phone] ________________________________  Name: Rebecca [name] MRN: [id number]  Date: 01/03/2017  DOB: [birth year]  Weekly Radiation Therapy Management    ICD-9-CM ICD-10-CM   1. Malignant neoplasm of upper-outer quadrant of right breast in female, estrogen receptor negative (HCC) 174.4 C50.411    V86.1 Z17.1      Current Dose: 48.6 Gy     Planned Dose:  59.4 Gy  Narrative . . . . . . . . The patient presents for routine under treatment assessment.                Rebecca [name] has completed 27 fractions to her right chest wall. She reports pain due to skin irritation on her right chest. She reports fatigue. She is using Radiaplex as directed.                                  Set-up films were reviewed.                                 The chart was checked. Physical Findings. . .  Vitals:   01/03/17 1212  BP: 115/81  Pulse: 78  Temp: 97.6 F (36.4 C)  Lungs are clear to auscultation bilaterally. Heart has regular rate and rhythm. Significant hyperpigmentation changes along right chest wall area. No skin breakdown noted. Impression . . . . . . . The patient is tolerating radiation. Plan . . . . . . . . . . . . Continue treatment as planned. ________________________________   Blair Promise, PhD, MD  This document serves as a record of services personally performed by Gery Pray, MD. It was created on his behalf by Maryla Morrow, a trained medical scribe. The creation of this record is based on the scribe's personal observations and the provider's statements to them. This document has been checked and approved by the attending provider.

## 2017-01-03 NOTE — Progress Notes (Signed)
Rebecca [name] has completed 27 fractions to her right chest wall.  She reports having pain due to skin irritation on her right chest.  She is using radiaplex.  She reports having fatigue.  The skin on her right chest has hyperpigmentation.  She has dry peeling under her right arm and on her right subclavian area.  She also reports using A & D ointment on the peeling areas.  BP 115/81 (BP Location: Left Arm, Patient Position: Sitting)   Pulse 78   Temp 97.6 F (36.4 C) (Oral)   Ht 5' 1.5" (1.562 m)   Wt 188 lb 3.2 oz (85.4 kg)   SpO2 100%   BMI 34.98 kg/m    Wt Readings from Last 3 Encounters:  01/03/17 188 lb 3.2 oz (85.4 kg)  12/27/16 188 lb (85.3 kg)  12/22/16 184 lb 6.4 oz (83.6 kg)

## 2017-01-04 ENCOUNTER — Ambulatory Visit
Admission: RE | Admit: 2017-01-04 | Discharge: 2017-01-04 | Disposition: A | Discharge status: Home / Self Care | Payer: Medicaid Other | Source: Ambulatory Visit | Attending: Radiation Oncology | Admitting: Radiation Oncology

## 2017-01-04 ENCOUNTER — Ambulatory Visit: Payer: Medicaid Other

## 2017-01-04 DIAGNOSIS — Z510 Encounter for antineoplastic radiation therapy: Secondary | ICD-10-CM | POA: Diagnosis not present

## 2017-01-05 ENCOUNTER — Ambulatory Visit
Admission: RE | Admit: 2017-01-05 | Discharge: 2017-01-05 | Disposition: A | Discharge status: Home / Self Care | Payer: Medicaid Other | Source: Ambulatory Visit | Attending: Radiation Oncology | Admitting: Radiation Oncology

## 2017-01-05 ENCOUNTER — Ambulatory Visit: Payer: Medicaid Other

## 2017-01-05 DIAGNOSIS — Z510 Encounter for antineoplastic radiation therapy: Secondary | ICD-10-CM | POA: Diagnosis not present

## 2017-01-06 ENCOUNTER — Ambulatory Visit: Payer: Medicaid Other

## 2017-01-09 ENCOUNTER — Ambulatory Visit
Admission: RE | Admit: 2017-01-09 | Discharge: 2017-01-09 | Disposition: A | Discharge status: Home / Self Care | Payer: Medicaid Other | Source: Ambulatory Visit | Attending: Radiation Oncology | Admitting: Radiation Oncology

## 2017-01-09 ENCOUNTER — Ambulatory Visit: Payer: Medicaid Other

## 2017-01-09 DIAGNOSIS — Z510 Encounter for antineoplastic radiation therapy: Secondary | ICD-10-CM | POA: Diagnosis not present

## 2017-01-10 ENCOUNTER — Encounter: Payer: Self-pay | Admitting: Radiation Oncology

## 2017-01-10 ENCOUNTER — Ambulatory Visit
Admission: RE | Admit: 2017-01-10 | Discharge: 2017-01-10 | Disposition: A | Discharge status: Home / Self Care | Payer: Medicaid Other | Source: Ambulatory Visit | Attending: Radiation Oncology | Admitting: Radiation Oncology

## 2017-01-10 ENCOUNTER — Ambulatory Visit: Payer: Medicaid Other

## 2017-01-10 VITALS — BP 107/74 | HR 102 | Temp 98.2°F | Ht 61.5 in | Wt 188.2 lb

## 2017-01-10 DIAGNOSIS — C50411 Malignant neoplasm of upper-outer quadrant of right female breast: Secondary | ICD-10-CM | POA: Diagnosis present

## 2017-01-10 DIAGNOSIS — Z171 Estrogen receptor negative status [ER-]: Secondary | ICD-10-CM | POA: Insufficient documentation

## 2017-01-10 DIAGNOSIS — Z510 Encounter for antineoplastic radiation therapy: Secondary | ICD-10-CM | POA: Diagnosis not present

## 2017-01-10 MED ORDER — SILVER SULFADIAZINE 1 % EX CREA
TOPICAL_CREAM | Freq: Every day | CUTANEOUS | Status: DC
Start: 2017-01-10 — End: 2017-01-10

## 2017-01-10 MED ORDER — SILVER SULFADIAZINE 1 % EX CREA
TOPICAL_CREAM | Freq: Once | CUTANEOUS | Status: AC
  Administered 2017-01-10: 13:00:00 via TOPICAL

## 2017-01-10 NOTE — Progress Notes (Signed)
Rebecca [name] has completed 31 fractions to her right chest wall.  She reports having sharp pains burning and itching in her right chest wall.  She is taking tramadol 1-2 times per day.  She reports having fatigue.  She is using radiaplex.  The skin on her right chest has hyperpigmentation with an area of peeling under right arm.  BP 107/74 (BP Location: Left Wrist, Patient Position: Sitting)   Pulse (!) 102   Temp 98.2 F (36.8 C) (Oral)   Ht 5' 1.5" (1.562 m)   Wt 188 lb 3.2 oz (85.4 kg)   SpO2 99%   BMI 34.98 kg/m    Wt Readings from Last 3 Encounters:  01/10/17 188 lb 3.2 oz (85.4 kg)  01/03/17 188 lb 3.2 oz (85.4 kg)  12/27/16 188 lb (85.3 kg)

## 2017-01-10 NOTE — Progress Notes (Signed)
  Radiation Oncology         (336) [phone] ________________________________  Name: Rebecca [name] MRN: [id number]  Date: 01/10/2017  DOB: 06/05/1976  Weekly Radiation Therapy Management    ICD-9-CM ICD-10-CM   1. Malignant neoplasm of upper-outer quadrant of right female breast, unspecified estrogen receptor status (HCC) 174.4 C50.411 DISCONTINUED: silver sulfADIAZINE (SILVADENE) 1 % cream  2. Malignant neoplasm of upper-outer quadrant of right breast in female, estrogen receptor negative (HCC) 174.4 C50.411 silver sulfADIAZINE (SILVADENE) 1 % cream   V86.1 Z17.1      Current Dose: 55.8 Gy     Planned Dose:  59.4 Gy  Narrative . . . . . . . . The patient presents for routine under treatment assessment.                Rebecca [name] has completed 31 fractions to her right chest wall. She reports having sharp pain, burning, and itching of her right chest wall. She is taking tramadol 1-2 times per day. She reports having fatigue. She is using radiaplex. The nurse notes the skin on her right chest has hyperpigmentation with an area of peeling under her right arm.                                  Set-up films were reviewed.                                 The chart was checked. Physical Findings. . .  Vitals:   01/10/17 1227  BP: 107/74  Pulse: (!) 102  Temp: 98.2 F (36.8 C)  Lungs are clear to auscultation bilaterally. Heart has regular rate and rhythm. Significant hyperpigmentation changes along right chest wall area. Area of skin breakdown along the right lateral mastectomy scar. Impression . . . . . . . The patient is tolerating radiation. Plan . . . . . . . . . . . . Continue treatment as planned. The patient was provided with Silvadene to apply to the area of skin breakdown. She will have a follow up appointment in 1 week. ________________________________   Blair Promise, PhD, MD  This document serves as a record of services personally performed by Gery Pray, MD. It was created on  his behalf by Darcus Austin, a trained medical scribe. The creation of this record is based on the scribe's personal observations and the provider's statements to them. This document has been checked and approved by the attending provider.

## 2017-01-11 ENCOUNTER — Ambulatory Visit: Payer: Medicaid Other

## 2017-01-11 ENCOUNTER — Ambulatory Visit
Admission: RE | Admit: 2017-01-11 | Discharge: 2017-01-11 | Disposition: A | Discharge status: Home / Self Care | Payer: Medicaid Other | Source: Ambulatory Visit | Attending: Radiation Oncology | Admitting: Radiation Oncology

## 2017-01-11 DIAGNOSIS — Z510 Encounter for antineoplastic radiation therapy: Secondary | ICD-10-CM | POA: Diagnosis not present

## 2017-01-12 ENCOUNTER — Ambulatory Visit
Admission: RE | Admit: 2017-01-12 | Discharge: 2017-01-12 | Disposition: A | Discharge status: Home / Self Care | Payer: Medicaid Other | Source: Ambulatory Visit | Attending: Radiation Oncology | Admitting: Radiation Oncology

## 2017-01-12 ENCOUNTER — Telehealth: Payer: Self-pay | Admitting: *Deleted

## 2017-01-12 DIAGNOSIS — Z510 Encounter for antineoplastic radiation therapy: Secondary | ICD-10-CM | POA: Diagnosis not present

## 2017-01-12 NOTE — Telephone Encounter (Signed)
Left vm congratulating pt on completing xrt. Contact information provided for questions.

## 2017-01-16 ENCOUNTER — Encounter: Payer: Self-pay | Admitting: Radiation Oncology

## 2017-01-16 NOTE — Progress Notes (Signed)
  Radiation Oncology         [phone]) [phone] ________________________________  Name: Rebecca [name] MRN: [id number]  Date: 01/16/2017  DOB: [birth year]  End of Treatment Note  Diagnosis:   YpT1c, ypN1a Grade 3 Invasive Ductal Carcinoma of the Right Breast, triple negative  Indication for treatment:  Curative       Radiation treatment dates:   11/15/16 - 01/12/17  Site/dose:   Right Chest Wall and axilla (4 field) treated to 45 Gy in 25 fractions, and then Boosted an additional 14.4 Gy in 8 fractions.  Beams/energy:   Right Chest Wall : 3D  //  10X, 15X, 6X        Boost : En Face  //  9 MeV  Narrative: The patient tolerated radiation treatment relatively well. She experienced pain to her right chest for which she took Tramadol with relief. The patient experienced fatigue. She had some radiation related skin changes including hyperpigmentation and skin peeling to the treatment area.  Plan: The patient has completed radiation treatment. The patient was advised to use Silvadene to the treatment area. The patient will return to radiation oncology clinic for routine followup in one month. I advised them to call or return sooner if they have any questions or concerns related to their recovery or treatment.  -----------------------------------  Blair Promise, PhD, MD  This document serves as a record of services personally performed by Gery Pray, MD. It was created on his behalf by Maryla Morrow, a trained medical scribe. The creation of this record is based on the scribe's personal observations and the provider's statements to them. This document has been checked and approved by the attending provider.

## 2017-01-23 ENCOUNTER — Ambulatory Visit: Payer: Self-pay | Admitting: Radiation Oncology

## 2017-02-09 ENCOUNTER — Ambulatory Visit: Payer: Medicaid Other | Admitting: Radiation Oncology

## 2017-02-10 ENCOUNTER — Encounter: Payer: Medicaid Other | Admitting: Adult Health

## 2017-03-02 ENCOUNTER — Ambulatory Visit: Admission: RE | Admit: 2017-03-02 | Payer: Medicaid Other | Source: Ambulatory Visit | Admitting: Radiation Oncology

## 2017-03-02 ENCOUNTER — Telehealth: Payer: Self-pay | Admitting: Oncology

## 2017-03-02 NOTE — Telephone Encounter (Signed)
Called patient regarding her follow up appointment today.  I was unable to leave a message.

## 2017-03-03 NOTE — Telephone Encounter (Signed)
Attempted to call patient again and was unable to leave a message.

## 2017-03-20 ENCOUNTER — Ambulatory Visit: Admission: RE | Admit: 2017-03-20 | Payer: Medicaid Other | Source: Ambulatory Visit | Admitting: Radiation Oncology

## 2017-03-20 ENCOUNTER — Encounter: Payer: Self-pay | Admitting: Oncology

## 2017-05-18 ENCOUNTER — Encounter (HOSPITAL_COMMUNITY): Payer: Self-pay

## 2017-06-08 ENCOUNTER — Ambulatory Visit
Admission: RE | Admit: 2017-06-08 | Discharge: 2017-06-08 | Disposition: A | Discharge status: Home / Self Care | Payer: Medicaid Other | Source: Ambulatory Visit | Attending: Radiation Oncology | Admitting: Radiation Oncology

## 2017-06-08 ENCOUNTER — Encounter: Payer: Self-pay | Admitting: Radiation Oncology

## 2017-06-08 DIAGNOSIS — Z08 Encounter for follow-up examination after completed treatment for malignant neoplasm: Secondary | ICD-10-CM | POA: Diagnosis present

## 2017-06-08 DIAGNOSIS — Z79891 Long term (current) use of opiate analgesic: Secondary | ICD-10-CM | POA: Insufficient documentation

## 2017-06-08 DIAGNOSIS — Z853 Personal history of malignant neoplasm of breast: Secondary | ICD-10-CM | POA: Insufficient documentation

## 2017-06-08 DIAGNOSIS — Z79899 Other long term (current) drug therapy: Secondary | ICD-10-CM | POA: Diagnosis not present

## 2017-06-08 DIAGNOSIS — C50411 Malignant neoplasm of upper-outer quadrant of right female breast: Secondary | ICD-10-CM

## 2017-06-08 MED ORDER — OMEPRAZOLE 20 MG PO CPDR: 20.0000 mg | DELAYED_RELEASE_CAPSULE | Freq: Every day | ORAL | 1 refills | Status: DC

## 2017-06-08 MED ORDER — NICOTINE 21 MG/24HR TD PT24: 21.0000 mg | MEDICATED_PATCH | Freq: Every day | TRANSDERMAL | 1 refills | Status: DC

## 2017-06-08 NOTE — Progress Notes (Signed)
Radiation Oncology         [phone] ________________________________  Name: Rebecca [name] MRN: [id number]  Date: 06/08/2017  DOB: 07-13-1976  Follow-Up Visit Note  CC: Dorena Dew, FNP  Alphonsa Overall, MD    ICD-10-CM   1. Malignant neoplasm of upper-outer quadrant of right female breast, unspecified estrogen receptor status ([place]) C50.411     Diagnosis: YpT1c, ypN1a Grade 3 Invasive Ductal Carcinoma of the Right Breast, triple negative  Interval Since Last Radiation:  5 months  Narrative:  Rebecca [name] presents for her first follow up after completing radiation 01/12/17 to her Right Chest Wall. She denies pain at this time. She does have tenderness to her Right Chest wall area. She also reports sharp pain to the Left and Right side of her head, lasts for only a few minutes. She tells me they occur about 1-2 times weekly. She has decreased her cigarette smoking to 1-2 daily. She is using a nicotine patch and asks for a prescription refill today. The skin to her Right Chest has healed and remains hyperpigmented. She is using vaseline to this area. She also mentions today that she would like a refill for her prilosec medicine.   ALLERGIES:  has No Known Allergies.  Meds: Current Outpatient Prescriptions  Medication Sig Dispense Refill  . capecitabine (XELODA) 500 MG tablet Take 2 tablets (1,000 mg total) by mouth 2 (two) times daily after a meal. (Patient not taking: Reported on 12/22/2016) 40 tablet 1  . carvedilol (COREG) 3.125 MG tablet Take 1 tablet (3.125 mg total) by mouth 2 (two) times daily with a meal. (Patient not taking: Reported on 12/13/2016) 60 tablet 6  . ketoconazole (NIZORAL) 2 % cream Apply 1 application topically daily. (Patient not taking: Reported on 12/13/2016) 15 g 0  . LORazepam (ATIVAN) 0.5 MG tablet Take 1 tablet (0.5 mg total) by mouth at bedtime. (Patient not taking: Reported on 06/08/2017) 30 tablet 0  . losartan (COZAAR) 25 MG tablet Take 0.5 tablets (12.5 mg total)  by mouth daily. (Patient not taking: Reported on 12/27/2016) 45 tablet 3  . nicotine (NICODERM CQ - DOSED IN MG/24 HOURS) 21 mg/24hr patch Place 1 patch (21 mg total) onto the skin daily. 28 patch 1  . omeprazole (PRILOSEC) 20 MG capsule Take 1 capsule (20 mg total) by mouth daily. 30 capsule 1  . potassium chloride (MICRO-K) 10 MEQ CR capsule Take 1 capsule (10 mEq total) by mouth daily. (Patient not taking: Reported on 12/13/2016) 30 capsule 3  . silver sulfADIAZINE (SILVADENE) 1 % cream Apply 1 application topically 2 (two) times daily.    . traMADol (ULTRAM) 50 MG tablet Take 1 tablet (50 mg total) by mouth every 6 (six) hours as needed. 60 tablet 2  . venlafaxine XR (EFFEXOR-XR) 75 MG 24 hr capsule Take 1 capsule (75 mg total) by mouth daily with breakfast. (Patient not taking: Reported on 06/08/2017) 30 capsule 6   No current facility-administered medications for this encounter.     Physical Findings: The patient is in no acute distress. Patient is alert and oriented.  weight is 199 lb (90.3 kg). Her temperature is 97.8 F (36.6 C). Her blood pressure is 111/85 and her pulse is 77. Her oxygen saturation is 100%. .  No significant changes. General: Alert and oriented, in no acute distress Neck: Neck is supple, no palpable cervical or supraclavicular lymphadenopathy. Heart: Regular in rate and rhythm with no murmurs, rubs, or gallops. Chest: Clear to auscultation bilaterally, with  no rhonchi, wheezes, or rales. Right Chest: hyperpigmentation changes, no papable or visible signs of reoccurrence. Left breast: no palpable mass.    Lab Findings: Lab Results  Component Value Date   WBC 11.2 (H) 10/04/2016   HGB 10.4 (L) 10/04/2016   HCT 31.3 (L) 10/04/2016   MCV 95.7 10/04/2016   PLT 320 10/04/2016    Radiographic Findings: No results found.  Impression:  YpT1c, ypN1a Grade 3 Invasive Ductal Carcinoma of the Right Breast, triple negative. No evidence of recurrence on clinical exam  today    Plan:   I prescribed a nicotine patch which patient has used in the past and has been helpful. Patient has a prn follow-up and I refilled her prescription for Prilosec. She will need to see her primary physician or Dr. [name] if she needs to refill her medications in the future since we are following on a prn basis.   ____________________________________ Gery Pray, MD   Blair Promise, PhD, MD  This document serves as a record of services personally performed by Gery Pray, MD. It was created on his behalf by Valeta Harms, a trained medical scribe. The creation of this record is based on the scribe's personal observations and the provider's statements to them. This document has been checked and approved by the attending provider.

## 2017-06-08 NOTE — Progress Notes (Signed)
[name] presents for her first follow up after completing radiation 01/12/17 to her Right Chest Wall. She denies pain at this time. She does have tenderness to her Right Chest wall area. She also reports sharp pain to the Left and Right side of her head. She tells me they occur about 1-2 times weekly. She has decreased her cigarette smoking to 1-2 daily. She is using a nicotine patch and asks for a prescription refill today. The skin to her Right Chest has healed and remains hyperpigmented. She is using vaseline to this area. She also mentions today that she would like a refill for her prilosec medicine.   BP 111/85   Pulse 77   Temp 97.8 F (36.6 C)   Wt 199 lb (90.3 kg)   SpO2 100% Comment: room air  BMI 36.99 kg/m    Wt Readings from Last 3 Encounters:  06/08/17 199 lb (90.3 kg)  01/10/17 188 lb 3.2 oz (85.4 kg)  01/03/17 188 lb 3.2 oz (85.4 kg)

## 2017-07-12 ENCOUNTER — Telehealth: Payer: Self-pay | Admitting: Oncology

## 2017-07-12 ENCOUNTER — Ambulatory Visit: Payer: Medicaid Other | Admitting: Oncology

## 2017-07-12 NOTE — Telephone Encounter (Signed)
Ms Rebecca [name] had to cancel her appointment today due to working 3rd shift and is r/s for 8/13 at 2pm

## 2017-07-17 ENCOUNTER — Ambulatory Visit (HOSPITAL_BASED_OUTPATIENT_CLINIC_OR_DEPARTMENT_OTHER): Payer: Medicaid Other | Admitting: Oncology

## 2017-07-17 VITALS — BP 137/76 | HR 91 | Temp 98.6°F | Resp 18 | Ht 61.5 in | Wt 203.5 lb

## 2017-07-17 DIAGNOSIS — Z720 Tobacco use: Secondary | ICD-10-CM

## 2017-07-17 DIAGNOSIS — Z171 Estrogen receptor negative status [ER-]: Secondary | ICD-10-CM | POA: Diagnosis not present

## 2017-07-17 DIAGNOSIS — C50411 Malignant neoplasm of upper-outer quadrant of right female breast: Secondary | ICD-10-CM

## 2017-07-17 NOTE — Progress Notes (Signed)
[facility]  [phone] Fax:(336) [phone]   ID: [name] DOB: 15-Jun-1976  MR#: [id number]  LAG#:[id number]  Patient Care Team: Dorena Dew, FNP as PCP - General (Family Medicine) Alphonsa Overall, MD as Consulting Physician (General Surgery) Chauncey Cruel, MD as Consulting Physician (Oncology) Gery Pray, MD as Consulting Physician (Radiation Oncology) Sylvan Cheese, NP as Nurse Practitioner (Hematology and Oncology) Benson Norway, RN as Registered Nurse PCP: Dorena Dew, FNP GYN: OTHER MD:  CHIEF COMPLAINT: triple negative breast cancer  CURRENT TREATMENT: Observation  BREAST CANCER HISTORY: From the original intake note:  Mysti herself noted a change in her right breast sometime around September or October 2016. She did not bring it to intermediate medical attention, but on 01/13/2016 she established herself in Dr. Smith Robert' service and she was set up for bilateral diagnostic mammography with tomosynthesis and bilateral ultrasonography at the [facility] 01/19/2016. The breast density was category B. In the upper outer quadrant of the right breast there was a spiculated mass measuring 2.8 cm. On physical exam this was palpable. Targeted ultrasonography confirmed an irregular hypoechoic mass in the right breast 11:30 o'clock position measuring 2.6 cm maximally. Ultrasound of the right axilla showed a morphologically abnormal lymph node.  In the left breast there were some tubular densities behind the areola which by ultrasonography showed benign ductal ectasia.  On 01/28/2016 Zakiyyah underwent biopsy of the right breast mass and abnormal right axillary lymph node. The pathology from this procedure (S [facility] [id number]) showed the lymph node to be benign. In the breast however there was an invasive ductal carcinoma, grade 3, which was estrogen and progesterone receptor negative. The proliferation marker was 70%. HER-2 was not amplified with a  signals ratio of 1.32. The number per cell was 2.05.  The patient's subsequent history is as detailed below  INTERVAL HISTORY: [name] returns today for follow-up of her triple negative breast cancer. 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 At home is just she and her children aged 25 through 41.  REVIEW OF SYSTEMS: [name] still has not been able to obtain a right-sided prosthesis and we will see if we can facilitate that. She has soreness and some shooting pain sometimes in the right chest wall area. A detailed review of systems today was otherwise entirely benign  PAST MEDICAL HISTORY: Past Medical History:  Diagnosis Date  . Anxiety   . Breast cancer ([facility])   . Depression   . GERD (gastroesophageal reflux disease)   . Hypertension    pt states is currently on no medications   . Obesity (BMI 35.0-39.9 without comorbidity) ([facility])   . Seasonal allergies   . Sickle cell trait ([facility])   . Termination of pregnancy (fetus) 04/02/16    PAST SURGICAL HISTORY: Past Surgical History:  Procedure Laterality Date  . CESAREAN SECTION     2004 and 2007    FAMILY HISTORY Family History  Problem Relation Age of Onset  . Hypertension Mother   . Uterine cancer Mother   . Breast cancer Cousin   . Cancer Father   . Hypertension Father   . Breast cancer Maternal Aunt   . Prostate cancer Maternal Uncle   . Breast cancer Maternal Grandmother   . Throat cancer Maternal Grandfather   The patient has very little information about her father. Her mother is currently 83 years old. She had a history  of cervical cancer at age 71. The patient had 2 brothers, no sisters. The maternal grandfather had throat cancer. A maternal uncle was diagnosed with breast cancer as well as prostate cancer at the age of 38. 2 maternal cousins, one of the mail, had breast cancer as well.  GYNECOLOGIC HISTORY:  No LMP recorded. Menarche age 60, first live birth  age 8. The patient is GX P4. She still having regular periods. She took oral contraceptives in the 1990s with no side effects.  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 The patient's significant other [name] works at [employer]. In addition to them the patient's 3 children Chasmine [place], [place] and [name] are also in the home. There are age 85, 86, and 78 as of August 2018. The patient's son [name], currently 45 years old, lives in [place].    ADVANCED DIRECTIVES: Not in place   HEALTH MAINTENANCE: Social History  Substance Use Topics  . Smoking status: Current Every Day Smoker    Packs/day: 0.25    Years: 20.00    Types: Cigarettes  . Smokeless tobacco: Never Used  . Alcohol use Yes     Comment: occ     Colonoscopy:  PAP:  Bone density:  Lipid panel:  No Known Allergies  Current Outpatient Prescriptions  Medication Sig Dispense Refill  . ketoconazole (NIZORAL) 2 % cream Apply 1 application topically daily. 15 g 0  . LORazepam (ATIVAN) 0.5 MG tablet Take 1 tablet (0.5 mg total) by mouth at bedtime. 30 tablet 0  . omeprazole (PRILOSEC) 20 MG capsule Take 1 capsule (20 mg total) by mouth daily. 30 capsule 3  . potassium chloride (MICRO-K) 10 MEQ CR capsule Take 1 capsule (10 mEq total) by mouth daily. 30 capsule 3  . venlafaxine XR (EFFEXOR-XR) 75 MG 24 hr capsule Take 1 capsule (75 mg total) by mouth daily with breakfast. 30 capsule 6   No current facility-administered medications for this visit.    OBJECTIVE: Young African-American womanWho appears well  Vitals:   07/17/17 1417  BP: 137/76  Pulse: 91  Resp: 18  Temp: 98.6 F (37 C)  SpO2: 100%   Body mass index is 37.83 kg/m.   ECOG FS:0  Sclerae unicteric, EOMs intact Oropharynx clear and moist No cervical or supraclavicular adenopathy Lungs no rales or rhonchi Heart regular rate and  rhythm Abd soft, nontender, positive bowel sounds MSK no focal spinal tenderness, no upper extremity lymphedema Neuro: nonfocal, well oriented, appropriate affect Breasts: The right breast is status post mastectomy and radiation. There is still hyperpigmentation. There is no evidence of local chest wall recurrence. The left breast is benign. Both axillae are benign.     LAB RESULTS: CMP Latest Ref Rng & Units 10/04/2016 08/11/2016 08/04/2016  Glucose 70 - 140 mg/dl 100 104 90  BUN 7.0 - 26.0 mg/dL 11.4 6.3(L) 6.9(L)  Creatinine 0.6 - 1.1 mg/dL 0.8 0.8 0.8  Sodium 136 - 145 mEq/L 143 143 144  Potassium 3.5 - 5.1 mEq/L 3.8 3.8 3.4(L)  Chloride 101 - 111 mmol/L - - -  CO2 22 - 29 mEq/L _0 Calcium 8.4 - 10.4 mg/dL 9.7 9.8 9.4  Total Protein 6.4 - 8.3 g/dL 8.0 8.3 7.5  Total Bilirubin 0.20 - 1.20 mg/dL <0.22 0.30 0.31  Alkaline Phos 40 - 150 U/L 72 67 58  AST 5 - 34 U/L _0 ALT 0 - 55 U/L _1 CBC    Component Value Date/Time   WBC 11.2 (H) 10/04/2016 1154   WBC 10.7 (H) 04/11/2016 1045   RBC 3.27 (L) 10/04/2016 1154   RBC 3.63 (L) 04/11/2016 1045   HGB 10.4 (L) 10/04/2016 1154   HCT 31.3 (L) 10/04/2016 1154   PLT 320 10/04/2016 1154   MCV 95.7 10/04/2016 1154   MCH 31.7 10/04/2016 1154   MCH 30.3 04/11/2016 1045   MCHC 33.1 10/04/2016 1154   MCHC 34.8 04/11/2016 1045   RDW 16.3 (H) 10/04/2016 1154   LYMPHSABS 1.7 10/04/2016 1154   MONOABS 0.5 10/04/2016 1154   EOSABS 0.3 10/04/2016 1154   BASOSABS 0.0 10/04/2016 1154    STUDIES: Past mammograms reviewed and neck left-sided mammography scheduled for February.  ASSESSMENT: 41 y.o. [place] woman status post right breast upper-outer quadrant biopsy 01/28/2016 for a clinical T2 N0 invasive ductal carcinoma, grade 3, triple negative, with an MIB-1 of 70%.  (a) suspicious right axillary lymph node biopsied 01/28/2016 was benign  (1) neoadjuvant chemotherapy: doxorubicin and cyclophosphamide in dose  dense fashion 4 started 04/14/16, completed 05/26/2016, followed by paclitaxel and carboplatin weekly 12, Started 06/09/2016  (a) taxol discontinued after 7 doses because of neuropathy, last dose 07/21/2016  (2) genetics testing October 20, 2016 through the 32-gene Comprehensive Cancer Panel offered by [facility] Junius Roads, MD) (with MSH2 Exons 1-7 Inversion Analysis) found no deleterious mutations or VUSS  In APC, ATM, AXIN2, BARD1, BMPR1A, BRCA1, BRCA2, BRIP1, CDH1, CDK4, CDKN2A, CHEK2, EPCAM, FANCC, MLH1, MSH2, MSH6, MUTYH, NBN, PALB2, PMS2, POLD1, POLE, PTEN, RAD51C, RAD51D, SCG5/GREM1, SMAD4, STK11, TP53, VHL, and XRCC2.    (3) right mastectomy and sentinel lymph node sampling 09/06/2016 showed a residual pT1c pN0, invasive ductal carcinoma, grade 3, with negative margins. Repeat prognostic panel again triple negative   (4) adjuvant radiation with capecitabine/Xeloda sensitization 11/15/16 - 01/12/17 Site/dose:   Right Chest Wall and axilla (4 field) treated to 45 Gy in 25 fractions, and then Boosted an additional 14.4 Gy in 8 fractions.  PLAN: [name] will soon be a year out from definitive surgery for her breast cancer with no evidence of disease recurrence this is favorable.   She has recovered quite well from her treatments. She still has some symptoms in the right chest wall area, but these were are expected and she was reassured the do not mean that she has recurrent breast cancer.  She will benefit from a right prosthesis and I have written her a prescription for that. She may also benefit from a couple of up-to-date bras.  Next mammogram will be in February. I will see her again in March. She knows to call for any problems that may develop before that visit.      Chauncey Cruel, MD 07/17/2017

## 2017-08-16 ENCOUNTER — Telehealth: Payer: Self-pay

## 2017-08-16 ENCOUNTER — Telehealth: Payer: Self-pay | Admitting: *Deleted

## 2017-08-16 ENCOUNTER — Other Ambulatory Visit: Payer: Self-pay

## 2017-08-16 DIAGNOSIS — Z171 Estrogen receptor negative status [ER-]: Principal | ICD-10-CM

## 2017-08-16 DIAGNOSIS — C50411 Malignant neoplasm of upper-outer quadrant of right female breast: Secondary | ICD-10-CM

## 2017-08-16 NOTE — Telephone Encounter (Signed)
Received call from pt stating that she is trying to get an appt with Dr Smith Robert in [place]. (Her PCP).  She asked to speak to [facility], Dr Magrinat's nurse.  Informed that he doesn't have a nurse named Heather.  Determined that it may be [name] that she has talked with before that has helped her.  She reports pain at base of neck, R upper chest & R arm that she rates a # 9 or 10 & started last 3-4 days. 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  She is having trouble laying down at night to sleep.  She is at work now.  She has not taken anything for pain.  She denies any swelling, redness, or warmth to areas.  She does not have a port.  Instructed to try tylenol or advil to see if this helps her pain.  Message to Dr Magrinat/pod RN & Arrie Aran.  Additional phone # for Dr Smith Robert given.

## 2017-08-16 NOTE — Telephone Encounter (Signed)
OP cancer rehab referral placed.  This RN called facility to confirm they have the order and they will call pt to set up appt.

## 2017-08-16 NOTE — Telephone Encounter (Signed)
Suggested to pt that she consider OP cancer rehab.  Pt agreeable.  Referral will be placed by this RN

## 2017-08-17 ENCOUNTER — Other Ambulatory Visit: Payer: Self-pay | Admitting: Oncology

## 2017-08-17 NOTE — Progress Notes (Signed)
[facility]  Telephone:(336) [phone] Fax:(336) [phone]   ID: Rebecca [name] DOB: 15-Jun-1976  MR#: [id number]  LAG#:[id number]  Patient Care Team: Dorena Dew, FNP as PCP - General (Family Medicine) Alphonsa Overall, MD as Consulting Physician (General Surgery) Chauncey Cruel, MD as Consulting Physician (Oncology) Gery Pray, MD as Consulting Physician (Radiation Oncology) Sylvan Cheese, NP as Nurse Practitioner (Hematology and Oncology) Benson Norway, RN as Registered Nurse PCP: Dorena Dew, FNP GYN: OTHER MD:  CHIEF COMPLAINT: triple negative breast cancer  [name] TREATMENT: Observation  BREAST CANCER HISTORY: From the original intake note:  Rebecca [name] herself noted a change in her right breast sometime around September or October 2016. She did not bring it to intermediate medical attention, but on 01/13/2016 she established herself in Dr. Smith Robert' service and she was set up for bilateral diagnostic mammography with tomosynthesis and bilateral ultrasonography at the [facility] 01/19/2016. The breast density was category B. In the upper outer quadrant of the right breast there was a spiculated mass measuring 2.8 cm. On physical exam this was palpable. Targeted ultrasonography confirmed an irregular hypoechoic mass in the right breast 11:30 o'clock position measuring 2.6 cm maximally. Ultrasound of the right axilla showed a morphologically abnormal lymph node.  In the left breast there were some tubular densities behind the areola which by ultrasonography showed benign ductal ectasia.  On 01/28/2016 [name] underwent biopsy of the right breast mass and abnormal right axillary lymph node. The pathology from this procedure (S AAA [id number]) showed the lymph node to be benign. In the breast however there was an invasive ductal carcinoma, grade 3, which was estrogen and progesterone receptor negative. The proliferation marker was 70%. HER-2 was not amplified with a  signals ratio of 1.32. The number per cell was 2.05.  The patient's subsequent history is as detailed below  INTERVAL HISTORY: Rebecca [name] returns today for follow-up of her triple negative breast cancer. 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 At home is just she and her children aged 25 through 41.  REVIEW OF SYSTEMS: Rebecca [name] still has not been able to obtain a right-sided prosthesis and we will see if we can facilitate that. She has soreness and some shooting pain sometimes in the right chest wall area. A detailed review of systems today was otherwise entirely benign  PAST MEDICAL HISTORY: Past Medical History:  Diagnosis Date  . Anxiety   . Breast cancer ([facility])   . Depression   . GERD (gastroesophageal reflux disease)   . Hypertension    pt states is currently on no medications   . Obesity (BMI 35.0-39.9 without comorbidity) ([facility])   . Seasonal allergies   . Sickle cell trait ([facility])   . Termination of pregnancy (fetus) 04/02/16    PAST SURGICAL HISTORY: Past Surgical History:  Procedure Laterality Date  . CESAREAN SECTION     2004 and 2007    FAMILY HISTORY Family History  Problem Relation Age of Onset  . Hypertension Mother   . Uterine cancer Mother   . Breast cancer Cousin   . Cancer Father   . Hypertension Father   . Breast cancer Maternal Aunt   . Prostate cancer Maternal Uncle   . Breast cancer Maternal Grandmother   . Throat cancer Maternal Grandfather   The patient has very little information about her father. Her mother is currently 83 years old. She had a history  of cervical cancer at age 59. The patient had 2 brothers, no sisters. The maternal grandfather had throat cancer. A maternal uncle was diagnosed with breast cancer as well as prostate cancer at the age of 58. 2 maternal cousins, one of the mail, had breast cancer as well.  GYNECOLOGIC HISTORY:  No LMP recorded. Menarche age 72, first live birth  age 9. The patient is GX P4. She still having regular periods. She took oral contraceptives in the 1990s with no side effects.  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 The patient's significant other Rebecca [name] works at [employer]. In addition to them the patient's 3 children Rebecca [name], Rebecca [name] and Rebecca [name] are also in the home. There are age 29, 16, and 25 as of August 2018. The patient's son Rebecca [name], currently 59 years old, lives in [place].    ADVANCED DIRECTIVES: Not in place   HEALTH MAINTENANCE: Social History  Substance Use Topics  . Smoking status: [name] Every Day Smoker    Packs/day: 0.25    Years: 20.00    Types: Cigarettes  . Smokeless tobacco: Never Used  . Alcohol use Yes     Comment: occ     Colonoscopy:  PAP:  Bone density:  Lipid panel:  No Known Allergies  [name] Outpatient Prescriptions  Medication Sig Dispense Refill  . ketoconazole (NIZORAL) 2 % cream Apply 1 application topically daily. 15 g 0  . LORazepam (ATIVAN) 0.5 MG tablet Take 1 tablet (0.5 mg total) by mouth at bedtime. 30 tablet 0  . omeprazole (PRILOSEC) 20 MG capsule Take 1 capsule (20 mg total) by mouth daily. 30 capsule 3  . potassium chloride (MICRO-K) 10 MEQ CR capsule Take 1 capsule (10 mEq total) by mouth daily. 30 capsule 3  . venlafaxine XR (EFFEXOR-XR) 75 MG 24 hr capsule Take 1 capsule (75 mg total) by mouth daily with breakfast. 30 capsule 6   No [name] facility-administered medications for this visit.    OBJECTIVE: Young African-American womanWho appears well  There were no vitals filed for this visit. There is no height or weight on file to calculate BMI.   ECOG FS:0  Sclerae unicteric, EOMs intact Oropharynx clear and moist No cervical or supraclavicular adenopathy Lungs no rales or rhonchi Heart regular rate and rhythm Abd soft, nontender,  positive bowel sounds MSK no focal spinal tenderness, no upper extremity lymphedema Neuro: nonfocal, well oriented, appropriate affect Breasts: The right breast is status post mastectomy and radiation. There is still hyperpigmentation. There is no evidence of local chest wall recurrence. The left breast is benign. Both axillae are benign.     LAB RESULTS: CMP Latest Ref Rng & Units 10/04/2016 08/11/2016 08/04/2016  Glucose 70 - 140 mg/dl 100 104 90  BUN 7.0 - 26.0 mg/dL 11.4 6.3(L) 6.9(L)  Creatinine 0.6 - 1.1 mg/dL 0.8 0.8 0.8  Sodium 136 - 145 mEq/L 143 143 144  Potassium 3.5 - 5.1 mEq/L 3.8 3.8 3.4(L)  Chloride 101 - 111 mmol/L - - -  CO2 22 - 29 mEq/L '26 24 27  '$ Calcium 8.4 - 10.4 mg/dL 9.7 9.8 9.4  Total Protein 6.4 - 8.3 g/dL 8.0 8.3 7.5  Total Bilirubin 0.20 - 1.20 mg/dL <0.22 0.30 0.31  Alkaline Phos 40 - 150 U/L 72 67 58  AST 5 - 34 U/L '11 17 16  '$ ALT 0 -  55 U/L '10 13 12     '$ CBC    Component Value Date/Time   WBC 11.2 (H) 10/04/2016 1154   WBC 10.7 (H) 04/11/2016 1045   RBC 3.27 (L) 10/04/2016 1154   RBC 3.63 (L) 04/11/2016 1045   HGB 10.4 (L) 10/04/2016 1154   HCT 31.3 (L) 10/04/2016 1154   PLT 320 10/04/2016 1154   MCV 95.7 10/04/2016 1154   MCH 31.7 10/04/2016 1154   MCH 30.3 04/11/2016 1045   MCHC 33.1 10/04/2016 1154   MCHC 34.8 04/11/2016 1045   RDW 16.3 (H) 10/04/2016 1154   LYMPHSABS 1.7 10/04/2016 1154   MONOABS 0.5 10/04/2016 1154   EOSABS 0.3 10/04/2016 1154   BASOSABS 0.0 10/04/2016 1154    STUDIES: Past mammograms reviewed and neck left-sided mammography scheduled for February.  RESEARCH: Referred to PREVENT study, but was pregnant at the time; referred to [facility] 11202, but the biopsied lymph node was benign; referred to weight loss study but declined; referred to [facility] 12/09/2000, but the timing of radiation and 8 was greater than 60 days past the date of diagnosis; referred to health disparity study, but declined; referred to [id number]  adjuvant therapy study, but the patient received Xeloda with radiation and therefore was ineligible  ASSESSMENT: 41 y.o. [place] woman status post right breast upper-outer quadrant biopsy 01/28/2016 for a clinical T2 N0 invasive ductal carcinoma, grade 3, triple negative, with an MIB-1 of 70%.  (a) suspicious right axillary lymph node biopsied 01/28/2016 was benign  (1) neoadjuvant chemotherapy: doxorubicin and cyclophosphamide in dose dense fashion 4 started 04/14/16, completed 05/26/2016, followed by paclitaxel and carboplatin weekly 12, Started 06/09/2016  (a) taxol discontinued after 7 doses because of neuropathy, last dose 07/21/2016  (2) genetics testing October 20, 2016 through the 32-gene Comprehensive Cancer Panel offered by [facility] Junius Roads, [place]) (with MSH2 Exons 1-7 Inversion Analysis) found no deleterious mutations or VUSS  In APC, ATM, AXIN2, BARD1, BMPR1A, BRCA1, BRCA2, BRIP1, CDH1, CDK4, CDKN2A, CHEK2, EPCAM, FANCC, MLH1, MSH2, MSH6, MUTYH, NBN, PALB2, PMS2, POLD1, POLE, PTEN, RAD51C, RAD51D, SCG5/GREM1, SMAD4, STK11, TP53, VHL, and XRCC2.    (3) right mastectomy and sentinel lymph node sampling 09/06/2016 showed a residual pT1c pN0, invasive ductal carcinoma, grade 3, with negative margins. Repeat prognostic panel again triple negative   (4) adjuvant radiation with capecitabine/Xeloda sensitization 11/15/16 - 01/12/17 Site/dose:   Right Chest Wall and axilla (4 field) treated to 45 Gy in 25 fractions, and then Boosted an additional 14.4 Gy in 8 fractions.  PLAN: Rebecca [name] will soon be a year out from definitive surgery for her breast cancer with no evidence of disease recurrence this is favorable.   She has recovered quite well from her treatments. She still has some symptoms in the right chest wall area, but these were are expected and she was reassured the do not mean that she has recurrent breast cancer.  She will benefit from a right prosthesis and I have  written her a prescription for that. She may also benefit from a couple of up-to-date bras.  Next mammogram will be in February. I will see her again in March. She knows to call for any problems that may develop before that visit.      Chauncey Cruel, MD 08/17/2017

## 2017-08-28 ENCOUNTER — Ambulatory Visit: Payer: Medicaid Other | Attending: Oncology | Admitting: Physical Therapy

## 2017-08-28 DIAGNOSIS — M79602 Pain in left arm: Secondary | ICD-10-CM | POA: Diagnosis present

## 2017-08-28 DIAGNOSIS — M25611 Stiffness of right shoulder, not elsewhere classified: Secondary | ICD-10-CM | POA: Insufficient documentation

## 2017-08-28 DIAGNOSIS — L599 Disorder of the skin and subcutaneous tissue related to radiation, unspecified: Secondary | ICD-10-CM | POA: Diagnosis present

## 2017-08-28 DIAGNOSIS — M79601 Pain in right arm: Secondary | ICD-10-CM | POA: Diagnosis present

## 2017-08-28 DIAGNOSIS — Z483 Aftercare following surgery for neoplasm: Secondary | ICD-10-CM | POA: Diagnosis present

## 2017-08-28 NOTE — Therapy (Addendum)
[place], [place], [address] Phone: [phone]   Fax:  [phone]  Physical Therapy Evaluation  Patient Details  Name: Rebecca [name] MRN: [id number] Date of Birth: 1976/11/07 Referring Provider: Dr. Lurline Del  Encounter Date: 08/28/2017      PT End of Session - 08/28/17 1211    Visit Number 1   Number of Visits 4   Date for PT Re-Evaluation 10/19/17   PT Start Time 0940   PT Stop Time 1021   PT Time Calculation (min) 41 min   Activity Tolerance Patient tolerated treatment well   Behavior During Therapy [facility] for tasks assessed/performed      Past Medical History:  Diagnosis Date  . Anxiety   . Breast cancer ([place])   . Depression   . GERD (gastroesophageal reflux disease)   . History of radiation therapy 11/15/16-01/12/17   right chest wall and axilla treated to 45 Gy in 25 fractions, boosted and additional 14 Gy in 8 fractions  . Hypertension    states losartan and coreg are for her heart  . Obesity (BMI 35.0-39.9 without comorbidity)   . Seasonal allergies   . Sickle cell trait ([place])   . Termination of pregnancy (fetus) 04/02/16    Past Surgical History:  Procedure Laterality Date  . CESAREAN SECTION     2004 and 2007  . MASTECTOMY W/ SENTINEL NODE BIOPSY Right 09/06/2016   Procedure: RIGHT BREAST MASTECTOMY WITH RIGHT AXILLARY SENTINEL LYMPH NODE BIOPSY;  Surgeon: Alphonsa Overall, MD;  Location: [facility];  Service: General;  Laterality: Right;  . PORT-A-CATH REMOVAL Left 09/06/2016   Procedure: REMOVAL PORT-A-CATH;  Surgeon: Alphonsa Overall, MD;  Location: [facility];  Service: General;  Laterality: Left;  . PORTACATH PLACEMENT      There were no vitals filed for this visit.       Subjective Assessment - 08/28/17 0945    Subjective "I've been having pain in my neck, my arms, and this neuropathy has been giving me a fit." Having both pain and numbness in  hands. Says her arms are big at the top and it may be swelling.   Pertinent History Diagnosed in 2016 with right breast cancer; had neo-adjuvant chemotherapy, mastectomy 09/06/16, and adjuvant radiation. She is unsure how many nodes were removed but some cancer was found; she says the radiation did target her axilla.    Patient Stated Goals to really get my arm back right, make sure I'm doig the exercises properly   Currently in Pain? Yes   Pain Score 3   to 10+   Pain Location Neck  to right shoulder, both arms   Pain Orientation Right  right > left   Pain Descriptors / Indicators Burning;Numbness;Pins and needles   Pain Onset More than a month ago   Aggravating Factors  lying down, when she gets up in the morning, at night   Pain Relieving Factors propping herself up in the bed; shaking arm, moving it around   Effect of Pain on Daily Activities difficulty sleeping; writing makes her hand go numb            [facility] PT Assessment - 08/28/17 0001      Assessment   Medical Diagnosis right breast cancer   Referring Provider Dr. Sarajane Jews Magrinat   Onset Date/Surgical Date 09/06/16  mastectomy   Hand Dominance Left   Prior Therapy none this year     Precautions  Precautions Other (comment)   Precaution Comments cancer precautions     Restrictions   Weight Bearing Restrictions No     Balance Screen   Has the patient fallen in the past 6 months No   Has the patient had a decrease in activity level because of a fear of falling?  No   Is the patient reluctant to leave their home because of a fear of falling?  No     Home Environment   Living Environment Private residence   Living Arrangements Children  3   [place] to enter   [place] One level     Prior Function   Level of [place]  does get help from children; has dropped glasses, etc.   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    Leisure walks slowly, about10-15 minutes 1-3x/week     Cognition   Overall Cognitive Status Within Functional Limits for tasks assessed     Observation/Other Assessments   Observations some redundant tissue around incision   Skin Integrity well-healed right mastectomy incision     Sensation   Light Touch Appears Intact     Posture/Postural Control   Posture/Postural Control Postural limitations   Postural Limitations Rounded Shoulders  on right particularly; also elevated on right     ROM / Strength   AROM / PROM / Strength AROM     AROM   AROM Assessment Site Shoulder;Cervical   Right/Left Shoulder Right;Left   Right Shoulder Flexion 159 Degrees   Right Shoulder ABduction 170 Degrees   Right Shoulder Internal Rotation 57 Degrees  in sitting   Right Shoulder External Rotation 79 Degrees   Left Shoulder Flexion 159 Degrees   Left Shoulder ABduction 170 Degrees   Left Shoulder Internal Rotation 56 Degrees  in sitting   Left Shoulder External Rotation 90 Degrees   Cervical Flexion WFL   Cervical Extension WFL   Cervical - Right Side Bend 25% loss and pulls opposite side   Cervical - Left Side Bend 25% loss and pulls   Cervical - Right Rotation WFL and pulls posteriorly   Cervical - Left Rotation 10% loss and pulls posteriorly     Palpation   Palpation comment limited soft tissue mobility around right mastectomy incision; very tight across upper traps bilat.           LYMPHEDEMA/ONCOLOGY QUESTIONNAIRE - 08/28/17 1010      Lymphedema Assessments   Lymphedema Assessments Upper extremities     Right Upper Extremity Lymphedema   15 cm Proximal to Olecranon Process 42.1 cm   10 cm Proximal to Olecranon Process 39.4 cm   Olecranon Process 26.9 cm   10 cm Proximal to Ulnar Styloid Process 24.8 cm   Just Proximal to Ulnar Styloid Process 19 cm   Across Hand at [employer] 21 cm   At [place]my [place] of 2nd Digit 6.6 cm     Left Upper Extremity Lymphedema   15 cm Proximal to  Olecranon Process 43.5 cm   10 cm Proximal to Olecranon Process 40 cm   Olecranon Process 27.8 cm   10 cm Proximal to Ulnar Styloid Process 25.8 cm   Just Proximal to Ulnar Styloid Process 18.7 cm   Across Hand at [employer] 20.5 cm   At [place] of 2nd Digit 6.8 cm         Objective measurements completed on examination: See above findings.  Long Term Clinic Goals - 08/28/17 1220      CC Long Term Goal  #1   Title Pt. will be knowledgeable about self-care for chest soft tissue mobilization.   Time 3   Period Weeks   Status New     CC Long Term Goal  #2   Title Pt. will be independent in HEP for neck and shoulder ROM; also for UE strengthening.   Time 3   Period Weeks   Status New     CC Long Term Goal  #3   Title Pt. will be knowledgeable about lymphedema risk reduction practices.   Time 3   Period Weeks   Status New     CC Long Term Goal  #4   Title Pt. will be knowledgeable about techniques to use to mitigate UE neuropathy symptoms.   Time 3   Period Weeks   Status New             Plan - 08/28/17 1212    Clinical Impression Statement This is a very pleasant woman who is almost one year post right mastectomy, having had neo-adjuvant chemotherapy and adjuvant radiation for right breast cancer.  She is at risk for lymphedema because she had positive lymph nodes and she says that her axillary node bed was targeted in radiation treatment.  She has fairly good shoulder ROM bilat., but right mastectomy scar area is bound down. She has neck and shoulder area pain with tight upper trap muscles and probably from the limited right chest soft tissue mobility.  She has pain in both arms from neuropathy.   Clinical Decision Making Low   Rehab Potential Good   PT Frequency --  3 visits total   PT Duration 6 weeks   PT Treatment/Interventions ADLs/Self Care Home Management;Electrical Stimulation;Moist Heat;Contrast Bath;DME  Instruction;Therapeutic exercise;Balance training;Patient/family education;Manual techniques;Scar mobilization;Passive range of motion   PT Next Visit Plan Focus in appointments on teaching patient what she needs to know to self-treat:  Soft tissue mobilization to scar area on right chest; stretches and soft tissue work to neck and upper back; techniques for mitigating neuropathy symptoms in her arms, including electrical stimulation and considering compression sleeves. Lymphedema risk reduction practices.   Consulted and Agree with Plan of Care Patient      Patient will benefit from skilled therapeutic intervention in order to improve the following deficits and impairments:  Pain, Impaired flexibility, Decreased knowledge of use of DME  Visit Diagnosis: Stiffness of right shoulder, not elsewhere classified - Plan: PT plan of care cert/re-cert  Pain in right arm - Plan: PT plan of care cert/re-cert  Pain in left arm - Plan: PT plan of care cert/re-cert  Aftercare following surgery for neoplasm - Plan: PT plan of care cert/re-cert  Disorder of the skin and subcutaneous tissue related to radiation, unspecified - Plan: PT plan of care cert/re-cert     Problem List Patient Active Problem List   Diagnosis Date Noted  . Genetic testing 11/20/2016  . Family history of breast cancer 11/20/2016  . Malignant neoplasm of upper-outer quadrant of right breast in female, estrogen receptor negative ([place]) 02/04/2016  . Hirsutism 01/13/2016  . Tobacco dependence 01/13/2016  . Irregular periods/menstrual cycles 01/13/2016    SALISBURY,DONNA 08/28/2017, 12:25 PM  [place], [place], [address] Phone: [phone]   Fax:  [phone]  Name: Evoleth Nordmeyer MRN: [id number] Date of Birth: 15-Apr-1976  Serafina Royals, PT 08/28/17 12:25 PM  PHYSICAL THERAPY DISCHARGE SUMMARY  Visits from Start of Care: 1  Current  functional level related to goals / functional outcomes: Unknown.  Patient did not return after initial evaluation for treatment as planned.   Remaining deficits: Unknown.   Education / Equipment: none Plan: Patient agrees to discharge.  Patient goals were not met. Patient is being discharged due to not returning since the last visit.  ?????    Serafina Royals, PT 03/26/18 1:22 PM

## 2018-02-09 ENCOUNTER — Other Ambulatory Visit: Payer: Self-pay | Admitting: *Deleted

## 2018-02-09 DIAGNOSIS — Z171 Estrogen receptor negative status [ER-]: Principal | ICD-10-CM

## 2018-02-09 DIAGNOSIS — C50411 Malignant neoplasm of upper-outer quadrant of right female breast: Secondary | ICD-10-CM

## 2018-02-11 NOTE — Progress Notes (Signed)
No show

## 2018-02-12 ENCOUNTER — Ambulatory Visit (HOSPITAL_BASED_OUTPATIENT_CLINIC_OR_DEPARTMENT_OTHER): Payer: Medicaid Other | Admitting: Oncology

## 2018-02-12 ENCOUNTER — Encounter: Payer: Self-pay | Admitting: Oncology

## 2018-02-12 ENCOUNTER — Other Ambulatory Visit: Payer: Medicaid Other

## 2018-02-12 DIAGNOSIS — C50411 Malignant neoplasm of upper-outer quadrant of right female breast: Secondary | ICD-10-CM

## 2018-02-12 DIAGNOSIS — Z171 Estrogen receptor negative status [ER-]: Secondary | ICD-10-CM

## 2018-02-22 ENCOUNTER — Telehealth: Payer: Self-pay | Admitting: Oncology

## 2018-02-22 NOTE — Telephone Encounter (Signed)
Called patient regarding voicemail  °

## 2018-02-25 ENCOUNTER — Emergency Department (HOSPITAL_BASED_OUTPATIENT_CLINIC_OR_DEPARTMENT_OTHER)
Admit: 2018-02-25 | Discharge: 2018-02-25 | Disposition: A | Discharge status: Home / Self Care | Payer: Medicaid Other | Attending: Emergency Medicine | Admitting: Emergency Medicine

## 2018-02-25 ENCOUNTER — Encounter (HOSPITAL_COMMUNITY): Payer: Self-pay | Admitting: *Deleted

## 2018-02-25 ENCOUNTER — Other Ambulatory Visit: Payer: Self-pay

## 2018-02-25 ENCOUNTER — Emergency Department (HOSPITAL_COMMUNITY)
Admission: EM | Admit: 2018-02-25 | Discharge: 2018-02-25 | Disposition: A | Discharge status: Home / Self Care | Payer: Medicaid Other | Attending: Emergency Medicine | Admitting: Emergency Medicine

## 2018-02-25 DIAGNOSIS — D573 Sickle-cell trait: Secondary | ICD-10-CM | POA: Diagnosis not present

## 2018-02-25 DIAGNOSIS — M79602 Pain in left arm: Secondary | ICD-10-CM | POA: Diagnosis not present

## 2018-02-25 DIAGNOSIS — R609 Edema, unspecified: Secondary | ICD-10-CM

## 2018-02-25 DIAGNOSIS — F329 Major depressive disorder, single episode, unspecified: Secondary | ICD-10-CM | POA: Insufficient documentation

## 2018-02-25 DIAGNOSIS — Z853 Personal history of malignant neoplasm of breast: Secondary | ICD-10-CM | POA: Insufficient documentation

## 2018-02-25 DIAGNOSIS — R2232 Localized swelling, mass and lump, left upper limb: Secondary | ICD-10-CM | POA: Diagnosis present

## 2018-02-25 DIAGNOSIS — F419 Anxiety disorder, unspecified: Secondary | ICD-10-CM | POA: Insufficient documentation

## 2018-02-25 DIAGNOSIS — F17210 Nicotine dependence, cigarettes, uncomplicated: Secondary | ICD-10-CM | POA: Insufficient documentation

## 2018-02-25 DIAGNOSIS — I10 Essential (primary) hypertension: Secondary | ICD-10-CM | POA: Insufficient documentation

## 2018-02-25 LAB — CBC WITH DIFFERENTIAL/PLATELET
BASOS ABS: 0 10*3/uL (ref 0.0–0.1)
BASOS PCT: 0 %
Eosinophils Absolute: 0.3 10*3/uL (ref 0.0–0.7)
Eosinophils Relative: 2 %
HCT: 35.4 % — ABNORMAL LOW (ref 36.0–46.0)
HEMOGLOBIN: 11.9 g/dL — AB (ref 12.0–15.0)
LYMPHS PCT: 19 %
Lymphs Abs: 2.7 10*3/uL (ref 0.7–4.0)
MCH: 29.8 pg (ref 26.0–34.0)
MCHC: 33.6 g/dL (ref 30.0–36.0)
MCV: 88.7 fL (ref 78.0–100.0)
Monocytes Absolute: 0.9 10*3/uL (ref 0.1–1.0)
Monocytes Relative: 6 %
NEUTROS PCT: 73 %
Neutro Abs: 10.3 10*3/uL — ABNORMAL HIGH (ref 1.7–7.7)
Platelets: 233 10*3/uL (ref 150–400)
RBC: 3.99 MIL/uL (ref 3.87–5.11)
RDW: 14.6 % (ref 11.5–15.5)
WBC: 14.3 10*3/uL — ABNORMAL HIGH (ref 4.0–10.5)

## 2018-02-25 LAB — BASIC METABOLIC PANEL
ANION GAP: 9 (ref 5–15)
BUN: 13 mg/dL (ref 6–20)
CALCIUM: 9.1 mg/dL (ref 8.9–10.3)
CHLORIDE: 104 mmol/L (ref 101–111)
CO2: 26 mmol/L (ref 22–32)
Creatinine, Ser: 0.99 mg/dL (ref 0.44–1.00)
GFR calc non Af Amer: >60 mL/min (ref >60)
GLUCOSE: 100 mg/dL — AB (ref 65–99)
POTASSIUM: 3.7 mmol/L (ref 3.5–5.1)
Sodium: 139 mmol/L (ref 135–145)

## 2018-02-25 MED ORDER — DIAZEPAM 5 MG PO TABS
5.0000 mg | ORAL_TABLET | Freq: Once | ORAL | Status: AC
  Administered 2018-02-25: 5 mg via ORAL
  Filled 2018-02-25: qty 1

## 2018-02-25 NOTE — ED Notes (Signed)
Ok to draw labs on left arm per m.d order.

## 2018-02-25 NOTE — Progress Notes (Signed)
VASCULAR LAB PRELIMINARY  PRELIMINARY  PRELIMINARY  PRELIMINARY  Left upper extremity venous duplex completed.    Preliminary report:  There is no DVT or SVT noted in the left upper extremity.  Gave report to Dr. Loney Laurence, [facility], RVT 02/25/2018, 7:08 PM

## 2018-02-25 NOTE — ED Notes (Signed)
Vascular with Pt.

## 2018-02-25 NOTE — Discharge Instructions (Addendum)
Return here for severe swelling to her left arm, weakness to your wrist or hand, or any other problems

## 2018-02-25 NOTE — ED Provider Notes (Signed)
[facility] DEPT Provider Note   CSN: [id number] Arrival date & time: 02/25/18  1636     History   Chief Complaint Chief Complaint  Patient presents with  . Arm Swelling    left    HPI Miles Borkowski is a 42 y.o. female.  42 year old female with history of breast cancer and status post right mastectomy presents with left-sided arm subjective edema times 2 weeks but worse over the past 2 days.  Also notes deep pain to the area which she states starts in her bone.  Denies any history of trauma.  She is left-handed.  Does work as a Psychologist, counselling.  No chest pain or shortness of breath.  No paresthesias to her left hand.  Denies any neck pain.  Symptoms persistent and worse with activity and better with rest.  She denies any change in color to her left hand.  No treatment used prior to arrival.  No prior history of same.  Denies any prior history of DVT.     Past Medical History:  Diagnosis Date  . Anxiety   . Breast cancer ([place])   . Depression   . GERD (gastroesophageal reflux disease)   . History of radiation therapy 11/15/16-01/12/17   right chest wall and axilla treated to 45 Gy in 25 fractions, boosted and additional 14 Gy in 8 fractions  . Hypertension    states losartan and coreg are for her heart  . Obesity (BMI 35.0-39.9 without comorbidity)   . Seasonal allergies   . Sickle cell trait ([place])   . Termination of pregnancy (fetus) 04/02/16    Patient Active Problem List   Diagnosis Date Noted  . Genetic testing 11/20/2016  . Family history of breast cancer 11/20/2016  . Malignant neoplasm of upper-outer quadrant of right breast in female, estrogen receptor negative ([place]) 02/04/2016  . Hirsutism 01/13/2016  . Tobacco dependence 01/13/2016  . Irregular periods/menstrual cycles 01/13/2016    Past Surgical History:  Procedure Laterality Date  . CESAREAN SECTION     2004 and 2007  . MASTECTOMY W/ SENTINEL NODE BIOPSY Right 09/06/2016   Procedure: RIGHT BREAST MASTECTOMY WITH RIGHT AXILLARY SENTINEL LYMPH NODE BIOPSY;  Surgeon: Alphonsa Overall, MD;  Location: [facility];  Service: General;  Laterality: Right;  . PORT-A-CATH REMOVAL Left 09/06/2016   Procedure: REMOVAL PORT-A-CATH;  Surgeon: Alphonsa Overall, MD;  Location: [facility];  Service: General;  Laterality: Left;  . PORTACATH PLACEMENT       OB History   None      Home Medications    Prior to Admission medications   Medication Sig Start Date End Date Taking? Authorizing Provider  capecitabine (XELODA) 500 MG tablet Take 2 tablets (1,000 mg total) by mouth 2 (two) times daily after a meal. Patient not taking: Reported on 12/22/2016 10/04/16   Magrinat, [name], MD  carvedilol (COREG) 3.125 MG tablet Take 1 tablet (3.125 mg total) by mouth 2 (two) times daily with a meal. Patient not taking: Reported on 12/13/2016 08/03/16   Larey Dresser, MD  ketoconazole (NIZORAL) 2 % cream Apply 1 application topically daily. Patient not taking: Reported on 12/13/2016 07/14/16   Magrinat, [name], MD  LORazepam (ATIVAN) 0.5 MG tablet Take 1 tablet (0.5 mg total) by mouth at bedtime. Patient not taking: Reported on 06/08/2017 12/22/16   Gery Pray, MD  losartan (COZAAR) 25 MG tablet Take 0.5 tablets (12.5 mg total) by mouth daily. Patient not taking: Reported  on 12/27/2016 08/11/16   Magrinat, [name], MD  nicotine (NICODERM CQ - DOSED IN MG/24 HOURS) 21 mg/24hr patch Place 1 patch (21 mg total) onto the skin daily. 06/08/17   Gery Pray, MD  omeprazole (PRILOSEC) 20 MG capsule Take 1 capsule (20 mg total) by mouth daily. 06/08/17   Gery Pray, MD  potassium chloride (MICRO-K) 10 MEQ CR capsule Take 1 capsule (10 mEq total) by mouth daily. Patient not taking: Reported on 12/13/2016 07/21/16   Magrinat, [name], MD  silver sulfADIAZINE (SILVADENE) 1 % cream Apply 1 application topically 2 (two) times daily.    [provider]  traMADol (ULTRAM) 50 MG  tablet Take 1 tablet (50 mg total) by mouth every 6 (six) hours as needed. Patient not taking: Reported on 08/28/2017 12/22/16   Gery Pray, MD  venlafaxine XR (EFFEXOR-XR) 75 MG 24 hr capsule Take 1 capsule (75 mg total) by mouth daily with breakfast. Patient not taking: Reported on 06/08/2017 07/14/16   Magrinat, [name], MD    Family History Family History  Problem Relation Age of Onset  . Hypertension Mother   . Cancer Mother        dx "intestinal cancer" in her 8s; +surgery  . Other Mother        hysterectomy at young age for unspecified cause  . Heart Problems Mother   . Breast cancer Cousin        maternal 1st cousin dx female breast cancer at 67-46y  . Cancer Father   . Hypertension Father   . Heart Problems Maternal Aunt   . Diabetes Maternal Aunt   . Breast cancer Maternal Uncle        dx 64-65  . Heart Problems Maternal Uncle   . Breast cancer Maternal Grandmother 31  . Throat cancer Maternal Grandfather        d. 77s; smoker  . Sickle cell anemia Paternal Aunt   . Congestive Heart Failure Maternal Aunt   . Multiple sclerosis Cousin   . Cancer Other        maternal great uncle (MGM's brother); cancer removed from his side  . Heart attack Paternal Aunt        d. early 75s    Social History Social History   Tobacco Use  . Smoking status: Current Every Day Smoker    Packs/day: 0.25    Years: 20.00    Pack years: 5.00    Types: Cigarettes  . Smokeless tobacco: Never Used  . Tobacco comment: 1-2 ciagarettes daily.   Substance Use Topics  . Alcohol use: Yes    Comment: occ  . Drug use: No     Allergies   Patient has no known allergies.   Review of Systems Review of Systems  All other systems reviewed and are negative.    Physical Exam Updated Vital Signs BP (!) 179/124 (BP Location: Right Leg)   Pulse (!) 102   Temp 98.4 F (36.9 C) (Oral)   Resp 18   Ht 1.562 m (5' 1.5")   Wt 101.6 kg (224 lb)   SpO2 95%   BMI 41.64 kg/m   Physical Exam   Constitutional: She is oriented to person, place, and time. She appears well-developed and well-nourished.  Non-toxic appearance. No distress.  HENT:  Head: Normocephalic and atraumatic.  Eyes: Pupils are equal, round, and reactive to light. Conjunctivae, EOM and lids are normal.  Neck: Normal range of motion. Neck supple. No tracheal deviation present. No  thyroid mass present.  Cardiovascular: Normal rate, regular rhythm and normal heart sounds. Exam reveals no gallop.  No murmur heard. Pulmonary/Chest: Effort normal and breath sounds normal. No stridor. No respiratory distress. She has no decreased breath sounds. She has no wheezes. She has no rhonchi. She has no rales.  Abdominal: Soft. Normal appearance and bowel sounds are normal. She exhibits no distension. There is no tenderness. There is no rebound and no CVA tenderness.  Musculoskeletal: Normal range of motion. She exhibits no edema or tenderness.  Left radial pulse 2+.  No obvious noticeable difference between extremities.  Median ulnar and radial nerves intact at the left hand.  Skin is normal.  Strength is 5/5  Neurological: She is alert and oriented to person, place, and time. She has normal strength. No cranial nerve deficit or sensory deficit. GCS eye subscore is 4. GCS verbal subscore is 5. GCS motor subscore is 6.  Skin: Skin is warm and dry. No abrasion and no rash noted.  Psychiatric: She has a normal mood and affect. Her speech is normal and behavior is normal.  Nursing note and vitals reviewed.    ED Treatments / Results  Labs (all labs ordered are listed, but only abnormal results are displayed) Labs Reviewed  CBC WITH DIFFERENTIAL/PLATELET  BASIC METABOLIC PANEL    EKG None  Radiology No results found.  Procedures Procedures (including critical care time)  Medications Ordered in ED Medications  diazepam (VALIUM) tablet 5 mg (has no administration in time range)     Initial Impression / Assessment and  Plan / ED Course  I have reviewed the triage vital signs and the nursing notes.  Pertinent labs & imaging results that were available during my care of the patient were reviewed by me and considered in my medical decision making (see chart for details).     Patient with negative Doppler ultrasound of her arm.  Arms appear to be the same size.  Renal function is normal.  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  She has no evidence of vascular compromise on physical exam.  She is stable for discharge and can follow-up with her doctor  Final Clinical Impressions(s) / ED Diagnoses   Final diagnoses:  None    ED Discharge Orders    None       Lacretia Leigh, MD 02/25/18 1954

## 2018-02-25 NOTE — ED Triage Notes (Addendum)
Sharp head pains at times. Over last week left arm hurting and swelling, no chest discomfort, directly related to arm according to pt. Pt noted to be hypertensive in triage. BP taken in bil lower legs due to pain in left arm and mastectomy involving rt arm. Pt noted to have good radial pulse and cap refills in left ext.

## 2018-04-09 NOTE — Progress Notes (Signed)
[place]  [phone] Fax:([phone]) [phone]   ID: Rebecca [name] DOB: Oct 29, 1976  MR#: [id number]  EYC#:[id number]  Patient Care Team: Dorena Dew, FNP as PCP - General (Family Medicine) Alphonsa Overall, MD as Consulting Physician (General Surgery) Chauncey Cruel, MD as Consulting Physician (Oncology) Gery Pray, MD as Consulting Physician (Radiation Oncology) Sylvan Cheese, NP as Nurse Practitioner (Hematology and Oncology) Benson Norway, RN as Registered Nurse OTHER MD:  CHIEF COMPLAINT: triple negative breast cancer  CURRENT TREATMENT: Observation  BREAST CANCER HISTORY: From the original intake note:  Rebecca [name] herself noted a change in her right breast sometime around September or October 2016. She did not bring it to intermediate medical attention, but on 01/13/2016 she established herself in Dr. Smith Robert' service and she was set up for bilateral diagnostic mammography with tomosynthesis and bilateral ultrasonography at the [facility] 01/19/2016. The breast density was category B. In the upper outer quadrant of the right breast there was a spiculated mass measuring 2.8 cm. On physical exam this was palpable. Targeted ultrasonography confirmed an irregular hypoechoic mass in the right breast 11:30 o'clock position measuring 2.6 cm maximally. Ultrasound of the right axilla showed a morphologically abnormal lymph node.  In the left breast there were some tubular densities behind the areola which by ultrasonography showed benign ductal ectasia.  On 01/28/2016 Rebecca [name] underwent biopsy of the right breast mass and abnormal right axillary lymph node. The pathology from this procedure (S [id number]) showed the lymph node to be benign. In the breast however there was an invasive ductal carcinoma, grade 3, which was estrogen and progesterone receptor negative. The proliferation marker was 70%. HER-2 was not amplified with a signals ratio of 1.32. The number  per cell was 2.05.  The patient's subsequent history is as detailed below  INTERVAL HISTORY: Rebecca [name] returns today for follow-up of her triple negative breast cancer. She is under observation alone. She is having pain in the lower left arm that hurts when she keeps the arm still. She exercises it to decrease the pain.  This is of course not the arm associated with her breast cancer surgery, which was on the right side.  She was supposed to have mammography in February 2019 at the [facility].  This needs to be scheduled  REVIEW OF SYSTEMS: Rebecca [name] reports that her arm pain is also present when she extends her fingers and arm. It also hurts when she sleeps. She went to one physical therapy class, but she is trying to make sure she has health insurance to pay for the classes. Right now, she works for a company that [employer], and she is unsure whether or not she qualifies for Medicaid. On a good note, she completely stopped smoking as of 04/04/2018. She has been using patches, she has reduced the urge to smoke. Her children are doing well, and she plans to go to [employer] this summer. She denies unusual headaches, visual changes, nausea, vomiting, or dizziness. There has been no unusual cough, phlegm production, or pleurisy. This been no change in bowel or bladder habits. She denies unexplained fatigue or unexplained weight loss, bleeding, rash, or fever. A detailed review of systems was otherwise stable.  PAST MEDICAL HISTORY: Past Medical History:  Diagnosis Date  . Anxiety   . Breast cancer ([place])   . Depression   . GERD (gastroesophageal reflux disease)   . Hypertension    pt states is currently on no medications   .  Obesity (BMI 35.0-39.9 without comorbidity) ([place])   . Seasonal allergies   . Sickle cell trait ([place])   . Termination of pregnancy (fetus) 04/02/16    PAST SURGICAL HISTORY: Past Surgical History:  Procedure Laterality Date  . CESAREAN SECTION      2004 and 2007    FAMILY HISTORY Family History  Problem Relation Age of Onset  . Hypertension Mother   . Uterine cancer Mother   . Breast cancer Cousin   . Cancer Father   . Hypertension Father   . Breast cancer Maternal Aunt   . Prostate cancer Maternal Uncle   . Breast cancer Maternal Grandmother   . Throat cancer Maternal Grandfather   The patient has very little information about her father. Her mother is currently 38 years old. She had a history of cervical cancer at age 13. The patient had 2 brothers, no sisters. The maternal grandfather had throat cancer. A maternal uncle was diagnosed with breast cancer as well as prostate cancer at the age of 32. 2 maternal cousins, one of the mail, had breast cancer as well.  GYNECOLOGIC HISTORY:  No LMP recorded. Menarche age 36, first live birth age 69. The patient is GX P4. She was still having regular periods at the time of diagnosis. She took oral contraceptives in the 1990s with no side effects.--.  Stopped with chemotherapy and have not resumed as of May 2019  SOCIAL HISTORY:    (Updated August 2018) She  works as a [employer]ar[employer] manager. Her mother used to manage a group home but is now retired. The patient's significant other [name] works at [employer]. In addition to them the patient's 3 children Chasmine Soudan, [place] and [name] are also in the home. There are age 61, 36, and 54 as of August 2018. The patient's son [name], currently 14 years old, lives in [place].    ADVANCED DIRECTIVES: Not in place   HEALTH MAINTENANCE: Social History  Substance Use Topics  . Smoking status: Current Every Day Smoker    Packs/day: 0.25    Years: 20.00    Types: Cigarettes  . Smokeless tobacco: Never Used  . Alcohol use Yes     Comment: occ     Colonoscopy:  PAP:  Bone density:  Lipid panel:  No Known Allergies  Current Outpatient Prescriptions  Her only medications at present are Prilosec 20 mg and  tramadol 50 mg as needed.  OBJECTIVE: [name] in no acute distress  Vitals:   04/10/18 0928  BP: 121/74  Pulse: 82  Resp: 18  Temp: 98.4 F (36.9 C)  SpO2: 100%   Body mass index is 42.9 kg/m.   ECOG FS: 1 - Symptomatic but completely ambulatory   Sclerae unicteric, pupils round and equal Oropharynx clear and moist No cervical or supraclavicular adenopathy Lungs no rales or rhonchi Heart regular rate and rhythm Abd soft, nontender, positive bowel sounds MSK no focal spinal tenderness, no right upper extremity lymphedema, no findings in the left arm to explain the discomfort she is experiencing Neuro: nonfocal, well oriented, positive affect Breasts: Status post right mastectomy followed by radiation with no evidence of local recurrence.  There is residual hyperpigmentation.  Left breast is benign.  Both axillae are benign.  LAB RESULTS: CMP Latest Ref Rng & Units 02/25/2018 10/04/2016 08/11/2016  Glucose 65 - 99 mg/dL 100(H) 100 104  BUN 6 - 20 mg/dL 13 11.4 6.3(L)  Creatinine 0.44 - 1.00 mg/dL 0.99  0.8 0.8  Sodium 135 - 145 mmol/L 139 143 143  Potassium 3.5 - 5.1 mmol/L 3.7 3.8 3.8  Chloride 101 - 111 mmol/L 104 - -  CO2 22 - 32 mmol/L _0 Calcium 8.9 - 10.3 mg/dL 9.1 9.7 9.8  Total Protein 6.4 - 8.3 g/dL - 8.0 8.3  Total Bilirubin 0.20 - 1.20 mg/dL - <0.22 0.30  Alkaline Phos 40 - 150 U/L - 72 67  AST 5 - 34 U/L - 11 17  ALT 0 - 55 U/L - 10 13     CBC    Component Value Date/Time   WBC 14.3 (H) 02/25/2018 1809   RBC 3.99 02/25/2018 1809   HGB 11.9 (L) 02/25/2018 1809   HGB 10.4 (L) 10/04/2016 1154   HCT 35.4 (L) 02/25/2018 1809   HCT 31.3 (L) 10/04/2016 1154   PLT 233 02/25/2018 1809   PLT 320 10/04/2016 1154   MCV 88.7 02/25/2018 1809   MCV 95.7 10/04/2016 1154   MCH 29.8 02/25/2018 1809   MCHC 33.6 02/25/2018 1809   RDW 14.6 02/25/2018 1809   RDW 16.3 (H) 10/04/2016 1154   LYMPHSABS 2.7 02/25/2018 1809   LYMPHSABS 1.7  10/04/2016 1154   MONOABS 0.9 02/25/2018 1809   MONOABS 0.5 10/04/2016 1154   EOSABS 0.3 02/25/2018 1809   EOSABS 0.3 10/04/2016 1154   BASOSABS 0.0 02/25/2018 1809   BASOSABS 0.0 10/04/2016 1154    STUDIES: She was supposed to have mammography in February 2019 at the [facility], however for some reason that has not yet happened.  RESEARCH: Referred to PREVENT study, but was pregnant at the time; referred to [facility] 11202, but the biopsied lymph node was benign; referred to weight loss study but declined; referred to [facility] 12/09/2000, but the timing of radiation and 8 was greater than 60 days past the date of diagnosis; referred to health disparity study, but declined; referred to [id number] adjuvant therapy study, but the patient received Xeloda with radiation and therefore was ineligible  ASSESSMENT: 42 y.o. Rebecca [name] woman status post right breast upper-outer quadrant biopsy 01/28/2016 for a clinical T2 N0 invasive ductal carcinoma, grade 3, triple negative, with an MIB-1 of 70%.  (a) suspicious right axillary lymph node biopsied 01/28/2016 was benign  (1) neoadjuvant chemotherapy: doxorubicin and cyclophosphamide in dose dense fashion 4 started 04/14/16, completed 05/26/2016, followed by paclitaxel and carboplatin weekly 12, Started 06/09/2016  (a) taxol discontinued after 7 doses because of neuropathy, last dose 07/21/2016  (2) genetics testing October 20, 2016 through the 32-gene Comprehensive Cancer Panel offered by [facility] Junius Roads, MD) (with MSH2 Exons 1-7 Inversion Analysis) found no deleterious mutations or VUSS  In APC, ATM, AXIN2, BARD1, BMPR1A, BRCA1, BRCA2, BRIP1, CDH1, CDK4, CDKN2A, CHEK2, EPCAM, FANCC, MLH1, MSH2, MSH6, MUTYH, NBN, PALB2, PMS2, POLD1, POLE, PTEN, RAD51C, RAD51D, SCG5/GREM1, SMAD4, STK11, TP53, VHL, and XRCC2.    (3) right mastectomy and sentinel lymph node sampling 09/06/2016 showed a residual pT1c pN0, invasive ductal carcinoma,  grade 3, with negative margins. Repeat prognostic panel again triple negative   (4) adjuvant radiation with capecitabine/Xeloda sensitization 11/15/16 - 01/12/17 Site/dose:   Right Chest Wall and axilla (4 field) treated to 45 Gy in 25 fractions, and then Boosted an additional 14.4 Gy in 8 fractions.  (5) tobacco abuse disorder: The patient quit smoking 04/04/2018  PLAN: [name] is now a year and a half out from definitive surgery for breast cancer with no evidence of disease recurrence.  This is  favorable.  I do not have an explanation for the discomfort she is feeling in the left forearm.  I think it might benefit from physical therapy and I have placed that referral.  Unfortunately she has no Medicaid or other insurance at this time so she may not be able to afford it.  I did refill her tramadol for her.  She is behind on mammography.  I have placed the order for that to be done sometime this month.  She will follow-up with her primary care physician probably in August.  The patient will see me again in 6 months.  At that time I will probably start seeing her on a once a year basis  She knows to call for any other issues that may develop before the next visit.    Edynn Gillock, [name], MD  04/10/18 9:47 AM Medical Oncology and Hematology [facility] [address] [place], [place] [address] Tel. [phone]    Fax. [phone]  This document serves as a record of services personally performed by Lurline Del, MD. It was created on his behalf by Sheron Nightingale, a trained medical scribe. The creation of this record is based on the scribe's personal observations and the provider's statements to them.   I have reviewed the above documentation for accuracy and completeness, and I agree with the above.

## 2018-04-10 ENCOUNTER — Telehealth: Payer: Self-pay | Admitting: Oncology

## 2018-04-10 ENCOUNTER — Inpatient Hospital Stay: Payer: Medicaid Other | Attending: Oncology

## 2018-04-10 ENCOUNTER — Inpatient Hospital Stay (HOSPITAL_BASED_OUTPATIENT_CLINIC_OR_DEPARTMENT_OTHER): Payer: Medicaid Other | Admitting: Oncology

## 2018-04-10 VITALS — BP 121/74 | HR 82 | Temp 98.4°F | Resp 18 | Ht 61.5 in | Wt 230.8 lb

## 2018-04-10 DIAGNOSIS — C50411 Malignant neoplasm of upper-outer quadrant of right female breast: Secondary | ICD-10-CM

## 2018-04-10 DIAGNOSIS — Z801 Family history of malignant neoplasm of trachea, bronchus and lung: Secondary | ICD-10-CM | POA: Diagnosis not present

## 2018-04-10 DIAGNOSIS — Z808 Family history of malignant neoplasm of other organs or systems: Secondary | ICD-10-CM | POA: Insufficient documentation

## 2018-04-10 DIAGNOSIS — F17210 Nicotine dependence, cigarettes, uncomplicated: Secondary | ICD-10-CM | POA: Insufficient documentation

## 2018-04-10 DIAGNOSIS — Z853 Personal history of malignant neoplasm of breast: Secondary | ICD-10-CM | POA: Diagnosis present

## 2018-04-10 DIAGNOSIS — Z9221 Personal history of antineoplastic chemotherapy: Secondary | ICD-10-CM | POA: Insufficient documentation

## 2018-04-10 DIAGNOSIS — F418 Other specified anxiety disorders: Secondary | ICD-10-CM | POA: Diagnosis not present

## 2018-04-10 DIAGNOSIS — D573 Sickle-cell trait: Secondary | ICD-10-CM | POA: Insufficient documentation

## 2018-04-10 DIAGNOSIS — M79632 Pain in left forearm: Secondary | ICD-10-CM | POA: Insufficient documentation

## 2018-04-10 DIAGNOSIS — K219 Gastro-esophageal reflux disease without esophagitis: Secondary | ICD-10-CM | POA: Insufficient documentation

## 2018-04-10 DIAGNOSIS — Z9011 Acquired absence of right breast and nipple: Secondary | ICD-10-CM | POA: Insufficient documentation

## 2018-04-10 DIAGNOSIS — Z171 Estrogen receptor negative status [ER-]: Secondary | ICD-10-CM

## 2018-04-10 DIAGNOSIS — Z6841 Body Mass Index (BMI) 40.0 and over, adult: Secondary | ICD-10-CM

## 2018-04-10 DIAGNOSIS — E669 Obesity, unspecified: Secondary | ICD-10-CM | POA: Insufficient documentation

## 2018-04-10 DIAGNOSIS — Z923 Personal history of irradiation: Secondary | ICD-10-CM | POA: Diagnosis not present

## 2018-04-10 DIAGNOSIS — I10 Essential (primary) hypertension: Secondary | ICD-10-CM | POA: Diagnosis not present

## 2018-04-10 LAB — CMP (CANCER CENTER ONLY)
ALBUMIN: 4.1 g/dL (ref 3.5–5.0)
ALK PHOS: 93 U/L (ref 40–150)
ALT: 16 U/L (ref 0–55)
ANION GAP: 6 (ref 3–11)
AST: 17 U/L (ref 5–34)
BUN: 9 mg/dL (ref 7–26)
CO2: 28 mmol/L (ref 22–29)
Calcium: 9.6 mg/dL (ref 8.4–10.4)
Chloride: 106 mmol/L (ref 98–109)
Creatinine: 0.95 mg/dL (ref 0.60–1.10)
GFR, Est AFR Am: >60 mL/min (ref >60)
GFR, Estimated: >60 mL/min (ref >60)
GLUCOSE: 143 mg/dL — AB (ref 70–140)
POTASSIUM: 3.7 mmol/L (ref 3.5–5.1)
SODIUM: 140 mmol/L (ref 136–145)
Total Bilirubin: 0.2 mg/dL (ref 0.2–1.2)
Total Protein: 7.7 g/dL (ref 6.4–8.3)

## 2018-04-10 LAB — CBC WITH DIFFERENTIAL (CANCER CENTER ONLY)
Basophils Absolute: 0 10*3/uL (ref 0.0–0.1)
Basophils Relative: 0 %
Eosinophils Absolute: 0.4 10*3/uL (ref 0.0–0.5)
Eosinophils Relative: 4 %
HEMATOCRIT: 36.4 % (ref 34.8–46.6)
Hemoglobin: 12.2 g/dL (ref 11.6–15.9)
LYMPHS PCT: 24 %
Lymphs Abs: 2.9 10*3/uL (ref 0.9–3.3)
MCH: 29.8 pg (ref 25.1–34.0)
MCHC: 33.5 g/dL (ref 31.5–36.0)
MCV: 89 fL (ref 79.5–101.0)
MONO ABS: 0.7 10*3/uL (ref 0.1–0.9)
MONOS PCT: 6 %
NEUTROS ABS: 7.9 10*3/uL — AB (ref 1.5–6.5)
Neutrophils Relative %: 66 %
Platelet Count: 221 10*3/uL (ref 145–400)
RBC: 4.09 MIL/uL (ref 3.70–5.45)
RDW: 14.6 % — AB (ref 11.2–14.5)
WBC Count: 11.9 10*3/uL — ABNORMAL HIGH (ref 3.9–10.3)

## 2018-04-10 MED ORDER — TRAMADOL HCL 50 MG PO TABS: 50.0000 mg | ORAL_TABLET | Freq: Four times a day (QID) | ORAL | 2 refills | Status: DC | PRN

## 2018-04-10 NOTE — Telephone Encounter (Signed)
Patient is checking on insurance before scheduling mammo

## 2018-04-10 NOTE — Telephone Encounter (Signed)
Gave avs and calendar patient requested morning

## 2018-04-25 ENCOUNTER — Ambulatory Visit: Payer: Medicaid Other | Attending: Oncology | Admitting: Physical Therapy

## 2018-06-29 ENCOUNTER — Other Ambulatory Visit: Payer: Self-pay | Admitting: *Deleted

## 2018-06-29 DIAGNOSIS — Z171 Estrogen receptor negative status [ER-]: Principal | ICD-10-CM

## 2018-06-29 DIAGNOSIS — C50411 Malignant neoplasm of upper-outer quadrant of right female breast: Secondary | ICD-10-CM

## 2018-07-11 ENCOUNTER — Ambulatory Visit: Payer: Medicaid Other | Attending: Oncology | Admitting: Physical Therapy

## 2018-07-17 DIAGNOSIS — C50912 Malignant neoplasm of unspecified site of left female breast: Secondary | ICD-10-CM | POA: Diagnosis not present

## 2018-07-26 DIAGNOSIS — C50911 Malignant neoplasm of unspecified site of right female breast: Secondary | ICD-10-CM | POA: Diagnosis not present

## 2018-08-07 ENCOUNTER — Other Ambulatory Visit: Payer: Self-pay | Admitting: Oncology

## 2018-08-07 DIAGNOSIS — Z171 Estrogen receptor negative status [ER-]: Principal | ICD-10-CM

## 2018-08-07 DIAGNOSIS — C50411 Malignant neoplasm of upper-outer quadrant of right female breast: Secondary | ICD-10-CM

## 2018-08-07 DIAGNOSIS — Z1231 Encounter for screening mammogram for malignant neoplasm of breast: Secondary | ICD-10-CM

## 2018-09-05 ENCOUNTER — Ambulatory Visit
Admission: RE | Admit: 2018-09-05 | Discharge: 2018-09-05 | Disposition: A | Discharge status: Home / Self Care | Payer: Medicaid Other | Source: Ambulatory Visit | Attending: Oncology | Admitting: Oncology

## 2018-09-05 DIAGNOSIS — Z1231 Encounter for screening mammogram for malignant neoplasm of breast: Secondary | ICD-10-CM

## 2018-09-05 DIAGNOSIS — C50411 Malignant neoplasm of upper-outer quadrant of right female breast: Secondary | ICD-10-CM

## 2018-09-05 DIAGNOSIS — Z171 Estrogen receptor negative status [ER-]: Principal | ICD-10-CM

## 2018-10-10 NOTE — Progress Notes (Signed)
[place]  [phone]) [phone] Fax:(336) [phone]   ID: Rebecca [name] DOB: 08/12/1976  MR#: [id number]  RJJ#:[id number]  Patient Care Team: Dorena Dew, FNP as PCP - General (Family Medicine) Alphonsa Overall, MD as Consulting Physician (General Surgery) Chauncey Cruel, MD as Consulting Physician (Oncology) Gery Pray, MD as Consulting Physician (Radiation Oncology) Sylvan Cheese, NP as Nurse Practitioner (Hematology and Oncology) Benson Norway, RN as Registered Nurse OTHER MD:  CHIEF COMPLAINT: triple negative breast cancer  CURRENT TREATMENT: Observation  BREAST CANCER HISTORY: From the original intake note:  Rebecca [name] herself noted a change in her right breast sometime around September or October 2016. She did not bring it to intermediate medical attention, but on 01/13/2016 she established herself in Dr. Smith Robert' service and she was set up for bilateral diagnostic mammography with tomosynthesis and bilateral ultrasonography at the [facility] 01/19/2016. The breast density was category B. In the upper outer quadrant of the right breast there was a spiculated mass measuring 2.8 cm. On physical exam this was palpable. Targeted ultrasonography confirmed an irregular hypoechoic mass in the right breast 11:30 o'clock position measuring 2.6 cm maximally. Ultrasound of the right axilla showed a morphologically abnormal lymph node.  In the left breast there were some tubular densities behind the areola which by ultrasonography showed benign ductal ectasia.  On 01/28/2016 Rebecca [name] underwent biopsy of the right breast mass and abnormal right axillary lymph node. The pathology from this procedure (S [id number]) showed the lymph node to be benign. In the breast however there was an invasive ductal carcinoma, grade 3, which was estrogen and progesterone receptor negative. The proliferation marker was 70%. HER-2 was not amplified with a signals ratio of 1.32. The number  per cell was 2.05.  The patient's subsequent history is as detailed below  INTERVAL HISTORY: Rebecca [name] returns today for follow-up of her triple negative breast cancer. She is under observation alone.   She continues to have neuropathy and pain to her bilateral arms. She notes that she has limited use of her arms due to pain. She uses CBD oil as well as a pain gel given to her by a friend that helps. She has right chest wall discomfort which she attributes to her arm pain. She has been to Physical Therapy once, however, her insurance hasn't paid for any more visits.   She had a unilateral left screening mammogram at [facility] on 09/05/2018 that showed: Breast density category B. No mammographic evidence of malignancy.    REVIEW OF SYSTEMS: Rebecca [name] reports that she has had blood in her stool that she noted upon wiping. She notes that she has had urinary leakage issues. She is no longer having menstrual cycles and she has had hot flashes. She denies vaginal dryness. She lives at home with her 3 children,16, 77, 89 and two of them are handicapped with sickle cell and autism. She ran out of her prilosec and tramadol. She denies unusual headaches, visual changes, nausea, vomiting, or dizziness. There has been no unusual cough, phlegm production, or pleurisy. This been no change in bowel or bladder habits. She denies unexplained fatigue or unexplained weight loss, bleeding, rash, or fever. A detailed review of systems was otherwise stable.    PAST MEDICAL HISTORY: Past Medical History:  Diagnosis Date  . Anxiety   . Breast cancer ([place])   . Depression   . GERD (gastroesophageal reflux disease)   . History of radiation therapy 11/15/16-01/12/17   right chest wall  and axilla treated to 45 Gy in 25 fractions, boosted and additional 14 Gy in 8 fractions  . Hypertension    states losartan and coreg are for her heart  . Obesity (BMI 35.0-39.9 without comorbidity)   . Seasonal allergies   . Sickle cell  trait ([place])   . Termination of pregnancy (fetus) 04/02/16    PAST SURGICAL HISTORY: Past Surgical History:  Procedure Laterality Date  . CESAREAN SECTION     2004 and 2007  . MASTECTOMY W/ SENTINEL NODE BIOPSY Right 09/06/2016   Procedure: RIGHT BREAST MASTECTOMY WITH RIGHT AXILLARY SENTINEL LYMPH NODE BIOPSY;  Surgeon: Alphonsa Overall, MD;  Location: [facility];  Service: General;  Laterality: Right;  . PORT-A-CATH REMOVAL Left 09/06/2016   Procedure: REMOVAL PORT-A-CATH;  Surgeon: Alphonsa Overall, MD;  Location: [facility];  Service: General;  Laterality: Left;  . PORTACATH PLACEMENT       FAMILY HISTORY Family History  Problem Relation Age of Onset  . Hypertension Mother   . Cancer Mother        dx "intestinal cancer" in her 56s; +surgery  . Other Mother        hysterectomy at Rebecca age for unspecified cause  . Heart Problems Mother   . Breast cancer Cousin        maternal 1st cousin dx female breast cancer at 70-46y  . Cancer Father   . Hypertension Father   . Heart Problems Maternal Aunt   . Diabetes Maternal Aunt   . Breast cancer Maternal Uncle        dx 64-65  . Heart Problems Maternal Uncle   . Breast cancer Maternal Grandmother 49  . Throat cancer Maternal Grandfather        d. 57s; smoker  . Sickle cell anemia Paternal Aunt   . Congestive Heart Failure Maternal Aunt   . Multiple sclerosis Cousin   . Cancer Other        maternal great uncle (MGM's brother); cancer removed from his side  . Heart attack Paternal Aunt        d. early 16s    The patient has very little information about her father. Her mother is currently 36 years old. She had a history of cervical cancer at age 62. The patient had 2 brothers, no sisters. The maternal grandfather had throat cancer. A maternal uncle was diagnosed with breast cancer as well as prostate cancer at the age of 12. 2 maternal cousins, one of the mail, had breast cancer as well.  GYNECOLOGIC HISTORY:    No LMP recorded. Menarche age 65, first live birth age 54. The patient is GX P4. She was still having regular periods at the time of diagnosis. She took oral contraceptives in the 1990s with no side effects.--.  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 Her mother used to manage a group home but is now retired. The patient's significant other [name] works at [employer].  At home with the patient are her 3 children [name], [place] and [place]. There are age 22, 63, and 85 as of November 2019.  2 of them are disabled or have significant health problems, one with sickle cell disease, the other with autism and developmental delay.  The patient's son [name], currently 34 years old, lives in [place].  ADVANCED DIRECTIVES: Not in place   HEALTH MAINTENANCE: Social History   Socioeconomic History  . Marital status: Single    Spouse name: Not on file  . Number of children: 4  . Years of education: Not on file  . Highest education level: Not on file  Occupational History  . Not on file  Social Needs  . Financial resource strain: Not on file  . Food insecurity:    Worry: Not on file    Inability: Not on file  . Transportation needs:    Medical: Not on file    Non-medical: Not on file  Tobacco Use  . Smoking status: Current Every Day Smoker    Packs/day: 0.25    Years: 20.00    Pack years: 5.00    Types: Cigarettes  . Smokeless tobacco: Never Used  . Tobacco comment: 1-2 ciagarettes daily.   Substance and Sexual Activity  . Alcohol use: Yes    Comment: occ  . Drug use: No  . Sexual activity: Not on file  Lifestyle  . Physical activity:    Days per week: Not on file    Minutes per session: Not on file  . Stress: Not on file  Relationships  . Social connections:    Talks on phone: Not on file    Gets together: Not on file     Attends religious service: Not on file    Active member of club or organization: Not on file    Attends meetings of clubs or organizations: Not on file    Relationship status: Not on file  Other Topics Concern  . Not on file  Social History Narrative  . Not on file     Colonoscopy:  PAP:  Bone density:  Lipid panel:  No Known Allergies  No current outpatient medications on file prior to visit.   No current facility-administered medications on file prior to visit.     OBJECTIVE: Rebecca [name] who appears stated age 14:   10/11/18 1130  BP: (!) 124/91  Pulse: 86  Resp: 18  Temp: 98.3 F (36.8 C)  SpO2: 98%   Body mass index is 42.31 kg/m.   ECOG FS: 1 - Symptomatic but completely ambulatory   Sclerae unicteric, EOMs intact No cervical or supraclavicular adenopathy Lungs no rales or rhonchi Heart regular rate and rhythm Abd soft, obese, nontender, positive bowel sounds MSK no focal spinal tenderness, no right upper extremity lymphedema Neuro: nonfocal, well oriented, appropriate affect Breasts: The right breast is status post mastectomy followed by radiation.  There is no evidence of chest wall recurrence.  The left breast is unremarkable.  Both axillae are benign.  LAB RESULTS: CMP Latest Ref Rng & Units 04/10/2018 02/25/2018 10/04/2016  Glucose 70 - 140 mg/dL 143(H) 100(H) 100  BUN 7 - 26 mg/dL 9 13 11.4  Creatinine 0.60 - 1.10 mg/dL 0.95 0.99 0.8  Sodium 136 - 145 mmol/L 140 139 143  Potassium 3.5 - 5.1 mmol/L 3.7 3.7 3.8  Chloride 98 - 109 mmol/L 106 104 -  CO2 22 - 29 mmol/L 28 26 26   Calcium 8.4 - 10.4 mg/dL 9.6 9.1 9.7  Total Protein 6.4 - 8.3 g/dL 7.7 - 8.0  Total Bilirubin 0.2 - 1.2 mg/dL 0.2 - <0.22  Alkaline Phos 40 - 150 U/L 93 - 72  AST 5 - 34 U/L 17 - 11  ALT 0 - 55 U/L 16 - 10     CBC    Component Value  Date/Time   WBC 10.0 10/11/2018 1121   RBC 4.09 10/11/2018 1121   HGB 11.8 (L) 10/11/2018 1121   HGB 12.2 04/10/2018  0908   HGB 10.4 (L) 10/04/2016 1154   HCT 35.6 (L) 10/11/2018 1121   HCT 31.3 (L) 10/04/2016 1154   PLT 216 10/11/2018 1121   PLT 221 04/10/2018 0908   PLT 320 10/04/2016 1154   MCV 87.0 10/11/2018 1121   MCV 95.7 10/04/2016 1154   MCH 28.9 10/11/2018 1121   MCHC 33.1 10/11/2018 1121   RDW 14.4 10/11/2018 1121   RDW 16.3 (H) 10/04/2016 1154   LYMPHSABS 2.5 10/11/2018 1121   LYMPHSABS 1.7 10/04/2016 1154   MONOABS 0.6 10/11/2018 1121   MONOABS 0.5 10/04/2016 1154   EOSABS 0.2 10/11/2018 1121   EOSABS 0.3 10/04/2016 1154   BASOSABS 0.0 10/11/2018 1121   BASOSABS 0.0 10/04/2016 1154    STUDIES: She had a unilateral left screening mammogram at [facility] on 09/05/2018 that showed: Breast density category B. No mammographic evidence of malignancy.   RESEARCH: Referred to PREVENT study, but was pregnant at the time; referred to [facility]02, but the biopsied lymph node was benign; referred to weight loss study but declined; referred to [facility] 12/09/2000, but the timing of radiation and 8 was greater than 60 days past the date of diagnosis; referred to health disparity study, but declined; referred to [id number] adjuvant therapy study, but the patient received Xeloda with radiation and therefore was ineligible  ASSESSMENT: 42 y.o. Rebecca [name] [name] status post right breast upper-outer quadrant biopsy 01/28/2016 for a clinical T2 N0 invasive ductal carcinoma, grade 3, triple negative, with an MIB-1 of 70%.  (a) suspicious right axillary lymph node biopsied 01/28/2016 was benign  (1) neoadjuvant chemotherapy: doxorubicin and cyclophosphamide in dose dense fashion 4 started 04/14/16, completed 05/26/2016, followed by paclitaxel and carboplatin weekly 12, Started 06/09/2016  (a) taxol discontinued after 7 doses because of neuropathy, last dose 07/21/2016  (2) genetics testing October 20, 2016 through the 32-gene Comprehensive Cancer Panel offered by [facility]  Junius Roads, MD) (with MSH2 Exons 1-7 Inversion Analysis) found no deleterious mutations or VUSS  In APC, ATM, AXIN2, BARD1, BMPR1A, BRCA1, BRCA2, BRIP1, CDH1, CDK4, CDKN2A, CHEK2, EPCAM, FANCC, MLH1, MSH2, MSH6, MUTYH, NBN, PALB2, PMS2, POLD1, POLE, PTEN, RAD51C, RAD51D, SCG5/GREM1, SMAD4, STK11, TP53, VHL, and XRCC2.    (3) right mastectomy and sentinel lymph node sampling 09/06/2016 showed a residual pT1c pN0, invasive ductal carcinoma, grade 3, with negative margins. Repeat prognostic panel again triple negative   (4) adjuvant radiation with capecitabine/Xeloda sensitization 11/15/16 - 01/12/17 Site/dose:   Right Chest Wall and axilla (4 field) treated to 45 Gy in 25 fractions, and then Boosted an additional 14.4 Gy in 8 fractions.  (5) tobacco abuse disorder: The patient quit smoking 04/04/2018  PLAN: [name] is now 2 years out from definitive surgery for her breast cancer with no evidence of disease recurrence.  This is very favorable.  She is having some symptoms relating to her right upper extremity related to her prior surgery and radiation.  It appears she did not receive adequate physical therapy because of insurance issues.  I have gone ahead and placed a second referral to see if we can get a little work done there.  I am not sure how much of what she is feeling is actually neuropathy versus simple will arthritis or fibromyalgia but I think she would benefit from gabapentin at bedtime and I wrote that for her.  I remain concerned about the possibility of recurrence in this patient and I have made her return appointments here in February and June of next year  She knows to call for any problems that may develop before then.  Magrinat, [name], MD  10/11/18 11:49 AM Medical Oncology and Hematology [facility] [address] [place], [place] [address] Tel. [phone]    Fax. [phone]    I, Soijett Blue am acting as scribe for Dr. Sarajane Jews C. Magrinat.  I,  Lurline Del MD, have reviewed the above documentation for accuracy and completeness, and I agree with the above.

## 2018-10-11 ENCOUNTER — Inpatient Hospital Stay: Payer: Medicaid Other | Attending: Oncology | Admitting: Oncology

## 2018-10-11 ENCOUNTER — Inpatient Hospital Stay: Payer: Medicaid Other

## 2018-10-11 ENCOUNTER — Telehealth: Payer: Self-pay | Admitting: Oncology

## 2018-10-11 VITALS — BP 124/91 | HR 86 | Temp 98.3°F | Resp 18 | Ht 61.5 in | Wt 227.6 lb

## 2018-10-11 DIAGNOSIS — Z9221 Personal history of antineoplastic chemotherapy: Secondary | ICD-10-CM | POA: Diagnosis not present

## 2018-10-11 DIAGNOSIS — I10 Essential (primary) hypertension: Secondary | ICD-10-CM | POA: Insufficient documentation

## 2018-10-11 DIAGNOSIS — Z853 Personal history of malignant neoplasm of breast: Secondary | ICD-10-CM | POA: Diagnosis present

## 2018-10-11 DIAGNOSIS — D573 Sickle-cell trait: Secondary | ICD-10-CM | POA: Diagnosis not present

## 2018-10-11 DIAGNOSIS — Z171 Estrogen receptor negative status [ER-]: Principal | ICD-10-CM

## 2018-10-11 DIAGNOSIS — C50411 Malignant neoplasm of upper-outer quadrant of right female breast: Secondary | ICD-10-CM

## 2018-10-11 DIAGNOSIS — Z923 Personal history of irradiation: Secondary | ICD-10-CM | POA: Insufficient documentation

## 2018-10-11 DIAGNOSIS — F17210 Nicotine dependence, cigarettes, uncomplicated: Secondary | ICD-10-CM | POA: Insufficient documentation

## 2018-10-11 DIAGNOSIS — Z9011 Acquired absence of right breast and nipple: Secondary | ICD-10-CM | POA: Diagnosis not present

## 2018-10-11 DIAGNOSIS — Z6841 Body Mass Index (BMI) 40.0 and over, adult: Secondary | ICD-10-CM

## 2018-10-11 LAB — CBC WITH DIFFERENTIAL/PLATELET
Abs Immature Granulocytes: 0.04 10*3/uL (ref 0.00–0.07)
BASOS ABS: 0 10*3/uL (ref 0.0–0.1)
BASOS PCT: 0 %
EOS ABS: 0.2 10*3/uL (ref 0.0–0.5)
EOS PCT: 2 %
HEMATOCRIT: 35.6 % — AB (ref 36.0–46.0)
Hemoglobin: 11.8 g/dL — ABNORMAL LOW (ref 12.0–15.0)
Immature Granulocytes: 0 %
LYMPHS ABS: 2.5 10*3/uL (ref 0.7–4.0)
Lymphocytes Relative: 25 %
MCH: 28.9 pg (ref 26.0–34.0)
MCHC: 33.1 g/dL (ref 30.0–36.0)
MCV: 87 fL (ref 80.0–100.0)
MONOS PCT: 6 %
Monocytes Absolute: 0.6 10*3/uL (ref 0.1–1.0)
NEUTROS PCT: 67 %
NRBC: 0 % (ref 0.0–0.2)
Neutro Abs: 6.6 10*3/uL (ref 1.7–7.7)
PLATELETS: 216 10*3/uL (ref 150–400)
RBC: 4.09 MIL/uL (ref 3.87–5.11)
RDW: 14.4 % (ref 11.5–15.5)
WBC: 10 10*3/uL (ref 4.0–10.5)

## 2018-10-11 LAB — COMPREHENSIVE METABOLIC PANEL
ALT: 14 U/L (ref 0–44)
ANION GAP: 8 (ref 5–15)
AST: 16 U/L (ref 15–41)
Albumin: 3.8 g/dL (ref 3.5–5.0)
Alkaline Phosphatase: 83 U/L (ref 38–126)
BUN: 11 mg/dL (ref 6–20)
CHLORIDE: 108 mmol/L (ref 98–111)
CO2: 27 mmol/L (ref 22–32)
Calcium: 9.8 mg/dL (ref 8.9–10.3)
Creatinine, Ser: 0.85 mg/dL (ref 0.44–1.00)
GFR calc Af Amer: >60 mL/min (ref >60)
Glucose, Bld: 106 mg/dL — ABNORMAL HIGH (ref 70–99)
Potassium: 3.8 mmol/L (ref 3.5–5.1)
SODIUM: 143 mmol/L (ref 135–145)
Total Bilirubin: 0.3 mg/dL (ref 0.3–1.2)
Total Protein: 7.8 g/dL (ref 6.5–8.1)

## 2018-10-11 MED ORDER — OMEPRAZOLE 20 MG PO CPDR
20.0000 mg | DELAYED_RELEASE_CAPSULE | Freq: Every day | ORAL | 1 refills | Status: DC
Start: 2018-10-11 — End: 2018-10-11

## 2018-10-11 MED ORDER — OMEPRAZOLE 20 MG PO CPDR: 20.0000 mg | DELAYED_RELEASE_CAPSULE | Freq: Every day | ORAL | 1 refills | Status: DC

## 2018-10-11 MED ORDER — GABAPENTIN 300 MG PO CAPS: 300.0000 mg | ORAL_CAPSULE | Freq: Every day | ORAL | 4 refills | Status: DC

## 2018-10-11 NOTE — Telephone Encounter (Signed)
Gave patient avs and calendar.  Physical Therapy Office will call patient with PT appt.

## 2018-10-18 ENCOUNTER — Encounter: Payer: Self-pay | Admitting: Rehabilitation

## 2018-10-18 ENCOUNTER — Ambulatory Visit: Payer: Medicaid Other | Admitting: Physical Therapy

## 2018-10-18 ENCOUNTER — Other Ambulatory Visit: Payer: Self-pay

## 2018-10-18 ENCOUNTER — Ambulatory Visit: Payer: Medicaid Other | Attending: Oncology | Admitting: Rehabilitation

## 2018-10-18 DIAGNOSIS — M79601 Pain in right arm: Secondary | ICD-10-CM | POA: Insufficient documentation

## 2018-10-18 DIAGNOSIS — R2689 Other abnormalities of gait and mobility: Secondary | ICD-10-CM | POA: Diagnosis not present

## 2018-10-18 DIAGNOSIS — Z483 Aftercare following surgery for neoplasm: Secondary | ICD-10-CM | POA: Diagnosis not present

## 2018-10-18 DIAGNOSIS — M79602 Pain in left arm: Secondary | ICD-10-CM | POA: Diagnosis not present

## 2018-10-18 DIAGNOSIS — M25611 Stiffness of right shoulder, not elsewhere classified: Secondary | ICD-10-CM | POA: Insufficient documentation

## 2018-10-18 DIAGNOSIS — L599 Disorder of the skin and subcutaneous tissue related to radiation, unspecified: Secondary | ICD-10-CM | POA: Diagnosis not present

## 2018-10-18 NOTE — Therapy (Signed)
[place], [place], [address] Phone: [phone]   Fax:  [phone]  Physical Therapy Evaluation  Patient Details  Name: Rebecca [name] MRN: [id number] Date of Birth: 12-29-1975 Referring Provider (PT): Dr. [name]   Encounter Date: 10/18/2018  PT End of Session - 10/18/18 1744    Visit Number  1    Number of Visits  12    Date for PT Re-Evaluation  11/22/18    Authorization Type  needs medicaid approval    PT Start Time  1345    PT Stop Time  1430    PT Time Calculation (min)  45 min    Activity Tolerance  Patient tolerated treatment well    Behavior During Therapy  [facility] for tasks assessed/performed       Past Medical History:  Diagnosis Date  . Anxiety   . Breast cancer ([place])   . Depression   . GERD (gastroesophageal reflux disease)   . History of radiation therapy 11/15/16-01/12/17   right chest wall and axilla treated to 45 Gy in 25 fractions, boosted and additional 14 Gy in 8 fractions  . Hypertension    states losartan and coreg are for her heart  . Obesity (BMI 35.0-39.9 without comorbidity)   . Seasonal allergies   . Sickle cell trait ([place])   . Termination of pregnancy (fetus) 04/02/16    Past Surgical History:  Procedure Laterality Date  . CESAREAN SECTION     2004 and 2007  . MASTECTOMY W/ SENTINEL NODE BIOPSY Right 09/06/2016   Procedure: RIGHT BREAST MASTECTOMY WITH RIGHT AXILLARY SENTINEL LYMPH NODE BIOPSY;  Surgeon: Alphonsa Overall, MD;  Location: [place];  Service: General;  Laterality: Right;  . PORT-A-CATH REMOVAL Left 09/06/2016   Procedure: REMOVAL PORT-A-CATH;  Surgeon: Alphonsa Overall, MD;  Location: [place];  Service: General;  Laterality: Left;  . PORTACATH PLACEMENT      There were no vitals filed for this visit.   Subjective Assessment - 10/18/18 1349    Subjective  I am having trouble with arms with tightness into the chest.  Both  arms but Rt>Lt.  Bil legs are also painful.  They also get numb    Pertinent History  neoadjuvant chemotherapy 5-11/17-05/26/16 doxorubicin and cyclophosphamide, then paclitaxel and carboplatin stopping last dose due to neuropathy in feet and hands, Rt mastectomy 09/06/16 with SLNB triple negative, then radiation 11/15/16-01/12/17 to the Rt chest wall and axilla    Limitations  Lifting;Standing;Walking;House hold activities;Other (comment)   work   Patient Stated Goals  my ability to work has been limited, have more useage in the arm    Currently in Pain?  Yes    Pain Score  5    if I walk too much up to 10/10   Pain Location  --   arms, hands, legs. and feet   Pain Orientation  Right;Left    Pain Descriptors / Indicators  Aching;Burning;Tingling    Pain Type  Neuropathic pain;Chronic pain    Pain Onset  More than a month ago    Pain Frequency  Constant    Aggravating Factors   walking, work, using the arm     Pain Relieving Factors  rest         [facility] PT Assessment - 10/18/18 0001      Assessment   Medical Diagnosis  Rt mastectomy and neuropathy     Referring Provider (PT)  Dr. [name]  Onset Date/Surgical Date  05/26/18    Hand Dominance  Left    Next MD Visit  may    Prior Therapy  no      Precautions   Precaution Comments  cancer, lymphedema      Restrictions   Weight Bearing Restrictions  No    Other Position/Activity Restrictions  n      Balance Screen   Has the patient fallen in the past 6 months  Yes    How many times?  5   feel like the Rt leg gives out   Has the patient had a decrease in activity level because of a fear of falling?   Yes    Is the patient reluctant to leave their home because of a fear of falling?   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, bathing, dressing    Leisure  walking, read, go out with friends      Cognition   Overall Cognitive Status  Within Functional Limits for tasks assessed      Observation/Other Assessments   Skin Integrity  tightness overall Rt mastectomy sight at pectoralis especially      Sensation   Additional Comments  numbness in hands and feet      Coordination   Gross Motor Movements are Fluid and Coordinated  Yes      Functional Tests   Functional tests  Step up;Sit to Stand;Other      Sit to Stand   Comments  in 30seconds: 20 inch height table: 7 times with increased foot pain      Other:   Other/ Comments  grip Rt: 35# and Lt 3# ( due to middle finger pain)       Posture/Postural Control   Posture/Postural Control  Postural limitations    Postural Limitations  Rounded Shoulders;Forward head;Increased thoracic kyphosis      ROM / Strength   AROM / PROM / Strength  AROM;Strength      AROM   Overall AROM Comments  all motions painful    AROM Assessment Site  Shoulder    Right/Left Shoulder  Right;Left    Right Shoulder Flexion  138 Degrees   tightness into the chest   Right Shoulder ABduction  115 Degrees   pull into chest   Right Shoulder Internal Rotation  80 Degrees    Right Shoulder External Rotation  80 Degrees    Left Shoulder Flexion  160 Degrees    Left Shoulder ABduction  160 Degrees    Left Shoulder Internal Rotation  80 Degrees    Left Shoulder External Rotation  80 Degrees    Right/Left Hip  Right;Left    Right/Left Knee  Right;Left      Strength   Overall Strength Comments  bil shoulder and elbow 4-/5 all motions all with pain    Strength Assessment Site  Knee;Ankle;Hip    Right/Left Shoulder  Right;Left    Right/Left Elbow  Right;Left    Right/Left Hip  Right;Left    Right Hip Flexion  4-/5  Right Hip External Rotation   4/5    Right  Hip Internal Rotation  4/5    Right Hip ABduction  3+/5    Left Hip Flexion  4-/5    Left Hip External Rotation  4-/5    Left Hip Internal Rotation  4-/5    Left Hip ABduction  4-/5    Right/Left Knee  Right;Left    Right Knee Flexion  4+/5    Right Knee Extension  4+/5    Left Knee Flexion  4+/5    Left Knee Extension  4+/5    Right/Left Ankle  Right;Left    Right Ankle Dorsiflexion  4/5    Right Ankle Plantar Flexion  4-/5    Right Ankle Inversion  3+/5    Right Ankle Eversion  3+/5    Left Ankle Dorsiflexion  4/5    Left Ankle Plantar Flexion  4-/5    Left Ankle Inversion  3+/5    Left Ankle Eversion  3+/5        LYMPHEDEMA/ONCOLOGY QUESTIONNAIRE - 10/18/18 1419      Type   Cancer Type  Right breast cancer      Surgeries   Mastectomy Date  09/06/16    Sentinel Lymph Node Biopsy Date  09/06/16    Number Lymph Nodes Removed  --   pt unsure     Treatment   Active Chemotherapy Treatment  No    Past Chemotherapy Treatment  Yes    Active Radiation Treatment  No    Past Radiation Treatment  Yes    Current Hormone Treatment  No    Past Hormone Therapy  No      Lymphedema Assessments   Lymphedema Assessments  Upper extremities      Right Upper Extremity Lymphedema   10 cm Proximal to Olecranon Process  39 cm    Olecranon Process  29 cm    10 cm Proximal to Ulnar Styloid Process  25.3 cm    Just Proximal to Ulnar Styloid Process  19.5 cm    Across Hand at [employer]  21.6 cm    At [place] of 2nd Digit  6.7 cm      Left Upper Extremity Lymphedema   10 cm Proximal to Olecranon Process  41 cm    Olecranon Process  31.5 cm    10 cm Proximal to Ulnar Styloid Process  25 cm    Just Proximal to Ulnar Styloid Process  22 cm    At Base of 2nd Digit  7 cm             Objective measurements completed on examination: See above findings.                   PT Long Term Goals - 10/18/18 1751      PT LONG TERM GOAL #1   Title  Pt will decrease  resting pain to 2/10 in the Rt UE     Baseline  4/10    Time  6    Period  Weeks    Status  New    Target Date  11/22/18      PT LONG TERM GOAL #2   Title  Pt will be educated on initial HEP for general LE and UE strength     Time  6    Period  Weeks    Status  New    Target Date  11/22/18      PT  LONG TERM GOAL #3   Title  Pt will improve Rt shoulder flexion and abduction by at least 10 degrees AROM     Baseline  138, 115    Time  6    Period  Weeks    Status  New    Target Date  11/22/18             Plan - 10/18/18 1745    Clinical Impression Statement  Chinelo presents today with reports of decreased Rt UE use and overall pain in bilateral UEs and LEs ever since chemotherapy.  Pt experienced CIPN and overall reports things are getting worse.  MD note stating he is unsure if this is all CIPN related or some with fibromyalgia and/or arthritis.  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  She has overall weakness bilateral LEs especially hips and ankles and overall UEs.  Her grip strength was minimal on the Lt today but it was more due to a finger injury.  She reports frequent falls and overall chronic pain.  Pt with limitations in her Rt UE due to very tight mastectomy scar and soft tissue surrounding.  pt seems very motivated.  Given TENS ordering infomation today.      History and Personal Factors relevant to plan of care:  neoadjuvant chemotherapy 5-11/17-05/26/16 doxorubicin and cyclophosphamide, then paclitaxel and carboplatin stopping last dose due to neuropathy in feet and hands, Rt mastectomy 09/06/16 with SLNB triple negative, then radiation 11/15/16-01/12/17 to the Rt chest wall and axilla    Clinical Presentation  Evolving    Clinical Presentation due to:  worsening of work tolerance,falls     Clinical Decision Making  Moderate    Rehab Potential  Good    PT Frequency  2x / week    PT Duration  6 weeks    PT  Treatment/Interventions  Electrical Stimulation;Therapeutic exercise;Therapeutic activities;Functional mobility training;Stair training;Gait training;Patient/family education;Passive range of motion    PT Next Visit Plan  perform 72min walk test, BERG or [place] balance scale, begin Rt UE ROM exercise education and manual work/PROM/scar tissue    Consulted and Agree with Plan of Care  Patient       Patient will benefit from skilled therapeutic intervention in order to improve the following deficits and impairments:  Pain, Decreased activity tolerance, Decreased strength, Impaired UE functional use  Visit Diagnosis: Stiffness of right shoulder, not elsewhere classified  Pain in right arm  Pain in left arm  Aftercare following surgery for neoplasm  Disorder of the skin and subcutaneous tissue related to radiation, unspecified  Other abnormalities of gait and mobility     Problem List Patient Active Problem List   Diagnosis Date Noted  . Morbid obesity with body mass index (BMI) of 40.0 to 44.9 in adult ([place]) 04/10/2018  . Genetic testing 11/20/2016  . Family history of breast cancer 11/20/2016  . Malignant neoplasm of upper-outer quadrant of right breast in female, estrogen receptor negative ([place]) 02/04/2016  . Hirsutism 01/13/2016  . Tobacco dependence 01/13/2016  . Irregular periods/menstrual cycles 01/13/2016    Rebecca [name], PT 10/18/2018, 5:54 PM  [place], [place], [address] Phone: [phone]   Fax:  [phone]  Name: Rebecca [name] MRN: [id number] Date of Birth: [birth year]

## 2018-10-25 ENCOUNTER — Ambulatory Visit: Payer: Medicaid Other

## 2018-10-25 DIAGNOSIS — R2689 Other abnormalities of gait and mobility: Secondary | ICD-10-CM

## 2018-10-25 DIAGNOSIS — M25611 Stiffness of right shoulder, not elsewhere classified: Secondary | ICD-10-CM

## 2018-10-25 DIAGNOSIS — L599 Disorder of the skin and subcutaneous tissue related to radiation, unspecified: Secondary | ICD-10-CM | POA: Diagnosis not present

## 2018-10-25 DIAGNOSIS — M79601 Pain in right arm: Secondary | ICD-10-CM | POA: Diagnosis not present

## 2018-10-25 DIAGNOSIS — M79602 Pain in left arm: Secondary | ICD-10-CM

## 2018-10-25 DIAGNOSIS — Z483 Aftercare following surgery for neoplasm: Secondary | ICD-10-CM | POA: Diagnosis not present

## 2018-10-25 NOTE — Therapy (Signed)
[name], [place], [address] Phone: [phone]   Fax:  [phone]  Physical Therapy Treatment  Patient Details  Name: Rebecca [name] MRN: [id number] Date of Birth: 11-19-1976 Referring Provider (PT): Dr. [name]   Encounter Date: 10/25/2018  PT End of Session - 10/25/18 1200    Visit Number  2    Number of Visits  12    Date for PT Re-Evaluation  11/29/18    Authorization Type  10/25/18-11/02/18 for 3 visits    PT Start Time  1114   pt got appt times mixed up and arrived late   PT Stop Time  1149    PT Time Calculation (min)  35 min    Activity Tolerance  Patient tolerated treatment well    Behavior During Therapy  [facility] for tasks assessed/performed       Past Medical History:  Diagnosis Date  . Anxiety   . Breast cancer ([place])   . Depression   . GERD (gastroesophageal reflux disease)   . History of radiation therapy 11/15/16-01/12/17   right chest wall and axilla treated to 45 Gy in 25 fractions, boosted and additional 14 Gy in 8 fractions  . Hypertension    states losartan and coreg are for her heart  . Obesity (BMI 35.0-39.9 without comorbidity)   . Seasonal allergies   . Sickle cell trait ([place])   . Termination of pregnancy (fetus) 04/02/16    Past Surgical History:  Procedure Laterality Date  . CESAREAN SECTION     2004 and 2007  . MASTECTOMY W/ SENTINEL NODE BIOPSY Right 09/06/2016   Procedure: RIGHT BREAST MASTECTOMY WITH RIGHT AXILLARY SENTINEL LYMPH NODE BIOPSY;  Surgeon: Alphonsa Overall, MD;  Location: [place];  Service: General;  Laterality: Right;  . PORT-A-CATH REMOVAL Left 09/06/2016   Procedure: REMOVAL PORT-A-CATH;  Surgeon: Alphonsa Overall, MD;  Location: [place];  Service: General;  Laterality: Left;  . PORTACATH PLACEMENT      There were no vitals filed for this visit.  Subjective Assessment - 10/25/18 1116    Subjective  I got my appt times  mixed up, thought I was supposed to be here at 11. My Rt arm is bothering me some but not as abd as usual.    Pertinent History  neoadjuvant chemotherapy 5-11/17-05/26/16 doxorubicin and cyclophosphamide, then paclitaxel and carboplatin stopping last dose due to neuropathy in feet and hands, Rt mastectomy 09/06/16 with SLNB triple negative, then radiation 11/15/16-01/12/17 to the Rt chest wall and axilla    Patient Stated Goals  my ability to work has been limited, have more useage in the arm    Currently in Pain?  Yes    Pain Score  3     Pain Location  Arm    Pain Orientation  Right    Pain Descriptors / Indicators  Aching;Sharp    Pain Radiating Towards  some low back and feet pain (reported this all feeling same)    Pain Onset  More than a month ago    Pain Frequency  Constant    Aggravating Factors   overusing arm    Pain Relieving Factors  rest                       [facility] Adult PT Treatment/Exercise - 10/25/18 0001      Shoulder Exercises: Supine   Horizontal ABduction  AAROM;Right;5 reps   with dowel, 5 sec  holds   External Rotation  AAROM;Right;5 reps   with dowel, 5 sec holds   External Rotation Limitations  Demo and tactile cues for correct technique and UE position; also done with fingers clasped behind head and then abduct elbows    Flexion  AAROM;Right;5 reps   with dowel, for 5 sec holds     Shoulder Exercises: Pulleys   Flexion  2 minutes    Flexion Limitations  Demo and VCs for correct technique    ABduction  2 minutes    ABduction Limitations  Pt returned therapist demo      Manual Therapy   Manual Therapy  Myofascial release;Passive ROM    Myofascial Release  UE pulling throughout    Passive ROM  In Supine to Rt shoulder into flexion and abduction             PT Education - 10/25/18 1123    Education Details  Supine dowel exercises    Person(s) Educated  Patient    Methods  Explanation;Demonstration;Handout    Comprehension  Verbalized  understanding;Returned demonstration;Need further instruction          PT Long Term Goals - 10/18/18 1751      PT LONG TERM GOAL #1   Title  Pt will decrease resting pain to 2/10 in the Rt UE     Baseline  4/10    Time  6    Period  Weeks    Status  New    Target Date  11/29/18      PT LONG TERM GOAL #2   Title  Pt will be educated on initial HEP for general LE and UE strength     Time  6    Period  Weeks    Status  New    Target Date  11/29/18      PT LONG TERM GOAL #3   Title  Pt will improve Rt shoulder flexion and abduction by at least 10 degrees AROM     Baseline  138, 115    Time  6    Period  Weeks    Status  New    Target Date  11/29/18            Plan - 10/25/18 1204    Clinical Impression Statement  Pt was supposed to be seen at 4 this afternoon but arrived this morning and this therapist had an opening so was able to do a shorter treatment. Instructed and issued HEP for inital ROM which she reported having some pain with but overall tolerated well. She was guarded initally with P/ROM but was able to relax and by end of stretching had near full P/ROM. Same with AA/ROM with pulleys at end of session she had near full AA/ROM and she reported her Rt shoulder     Rehab Potential  Good    PT Frequency  2x / week    PT Duration  6 weeks    PT Treatment/Interventions  Electrical Stimulation;Therapeutic exercise;Therapeutic activities;Functional mobility training;Stair training;Gait training;Patient/family education;Passive range of motion    PT Next Visit Plan  PT to perform 51min walk test, BERG or [place] balance scale and add goal prn, Cont Rt UE ROM exercise education and manual work/PROM/scar tissue    Consulted and Agree with Plan of Care  Patient       Patient will benefit from skilled therapeutic intervention in order to improve the following deficits and impairments:  Pain, Decreased activity tolerance, Decreased strength,  Impaired UE functional  use  Visit Diagnosis: Stiffness of right shoulder, not elsewhere classified  Pain in right arm  Pain in left arm  Aftercare following surgery for neoplasm  Disorder of the skin and subcutaneous tissue related to radiation, unspecified  Other abnormalities of gait and mobility     Problem List Patient Active Problem List   Diagnosis Date Noted  . Morbid obesity with body mass index (BMI) of 40.0 to 44.9 in adult ([place]) 04/10/2018  . Genetic testing 11/20/2016  . Family history of breast cancer 11/20/2016  . Malignant neoplasm of upper-outer quadrant of right breast in female, estrogen receptor negative ([place]) 02/04/2016  . Hirsutism 01/13/2016  . Tobacco dependence 01/13/2016  . Irregular periods/menstrual cycles 01/13/2016    Otelia Limes, PTA 10/25/2018, 12:16 PM  [place], [place], [address] Phone: [phone]   Fax:  [phone]  Name: Beverely Suen MRN: [id number] Date of Birth: [birth year]

## 2018-10-25 NOTE — Patient Instructions (Signed)

## 2018-11-06 ENCOUNTER — Ambulatory Visit: Payer: Medicaid Other | Attending: Oncology

## 2018-11-06 DIAGNOSIS — M25611 Stiffness of right shoulder, not elsewhere classified: Secondary | ICD-10-CM | POA: Insufficient documentation

## 2018-11-08 ENCOUNTER — Encounter: Payer: Self-pay | Admitting: Physical Therapy

## 2018-11-08 ENCOUNTER — Other Ambulatory Visit: Payer: Self-pay

## 2018-11-08 ENCOUNTER — Ambulatory Visit: Payer: Medicaid Other | Admitting: Physical Therapy

## 2018-11-08 DIAGNOSIS — M25611 Stiffness of right shoulder, not elsewhere classified: Secondary | ICD-10-CM

## 2018-11-08 NOTE — Therapy (Addendum)
[place], [place], [address] Phone: [phone]   Fax:  [phone]  Patient Details  Name: Rebecca [name] MRN: [id number] Date of Birth: 1976/07/13 Referring Provider:  Chauncey Cruel, MD  Encounter Date: 11/08/2018  Pt arrived to visit. Assessed goals and sent paperwork to [facility] for reauthorization of visits. Pt missed visits in the past authorization period so was unable to update authorization before it expired.   Allyson Sabal [facility] 11/08/2018, 5:15 PM  [place], [place], [address] Phone: [phone]   Fax:  [phone]   PHYSICAL THERAPY DISCHARGE SUMMARY  Visits from Start of Care: 2 Current functional level related to goals / functional outcomes: Unknown as pt has not returned for PT She has had 3 no show visits and phone mailbox is full so cannot leave a message for patient    Remaining deficits: unknown   Plan: Patient agrees to discharge.  Patient goals were not met. Patient is being discharged due to not returning since the last visit.  ?????    Donato Heinz. Owens Shark, PT

## 2018-11-15 ENCOUNTER — Ambulatory Visit: Payer: Medicaid Other

## 2018-11-20 ENCOUNTER — Ambulatory Visit: Payer: Medicaid Other

## 2018-11-22 ENCOUNTER — Ambulatory Visit: Payer: Medicaid Other | Admitting: Physical Therapy

## 2018-11-23 ENCOUNTER — Ambulatory Visit: Payer: Medicaid Other | Admitting: Physical Therapy

## 2018-11-23 ENCOUNTER — Telehealth: Payer: Self-pay | Admitting: Physical Therapy

## 2018-11-23 NOTE — Telephone Encounter (Signed)
Pt did not show up for her appointment today. Called to check on patient, but there was no answer and mailbox is full.  She has had 3 no shows and Medicaid authorization expires on 11/29/2018 Will discharge this patient. Donato Heinz. Owens Shark, PT

## 2018-11-26 ENCOUNTER — Ambulatory Visit: Payer: Medicaid Other

## 2019-01-16 ENCOUNTER — Inpatient Hospital Stay: Payer: Medicaid Other

## 2019-01-16 ENCOUNTER — Inpatient Hospital Stay: Payer: Medicaid Other | Admitting: Adult Health

## 2019-01-16 NOTE — Progress Notes (Deleted)
[facility]  Telephone:([phone]) [phone] Fax:(336) [phone]   ID: Shaela Boer DOB: Mar 13, 1976  MR#: [id number]  OEU#:[id number]  Patient Care Team: Dorena Dew, FNP as PCP - General (Family Medicine) Alphonsa Overall, MD as Consulting Physician (General Surgery) Chauncey Cruel, MD as Consulting Physician (Oncology) Gery Pray, MD as Consulting Physician (Radiation Oncology) Sylvan Cheese, NP as Nurse Practitioner (Hematology and Oncology) Benson Norway, RN as Registered Nurse OTHER MD:  CHIEF COMPLAINT: triple negative breast cancer  CURRENT TREATMENT: Observation  BREAST CANCER HISTORY: From the original intake note:  Tira herself noted a change in her right breast sometime around September or October 2016. She did not bring it to intermediate medical attention, but on 01/13/2016 she established herself in Dr. Smith Robert' service and she was set up for bilateral diagnostic mammography with tomosynthesis and bilateral ultrasonography at the [facility] 01/19/2016. The breast density was category B. In the upper outer quadrant of the right breast there was a spiculated mass measuring 2.8 cm. On physical exam this was palpable. Targeted ultrasonography confirmed an irregular hypoechoic mass in the right breast 11:30 o'clock position measuring 2.6 cm maximally. Ultrasound of the right axilla showed a morphologically abnormal lymph node.  In the left breast there were some tubular densities behind the areola which by ultrasonography showed benign ductal ectasia.  On 01/28/2016 [facility] underwent biopsy of the right breast mass and abnormal right axillary lymph node. The pathology from this procedure (S [id number]) showed the lymph node to be benign. In the breast however there was an invasive ductal carcinoma, grade 3, which was estrogen and progesterone receptor negative. The proliferation marker was 70%. HER-2 was not amplified with a signals ratio of 1.32. The number  per cell was 2.05.  The patient's subsequent history is as detailed below  INTERVAL HISTORY: Myley returns today for follow-up of her triple negative breast cancer. She is under observation alone.   She last underwent unilateral left screening mammogram with tomo on 09/05/2018 that shows no mammographic evidence of malignancy and breast density category B.    REVIEW OF SYSTEMS: QPYPP    PAST MEDICAL HISTORY: Past Medical History:  Diagnosis Date  . Anxiety   . Breast cancer ([place])   . Depression   . GERD (gastroesophageal reflux disease)   . History of radiation therapy 11/15/16-01/12/17   right chest wall and axilla treated to 45 Gy in 25 fractions, boosted and additional 14 Gy in 8 fractions  . Hypertension    states losartan and coreg are for her heart  . Obesity (BMI 35.0-39.9 without comorbidity)   . Seasonal allergies   . Sickle cell trait ([place])   . Termination of pregnancy (fetus) 04/02/16    PAST SURGICAL HISTORY: Past Surgical History:  Procedure Laterality Date  . CESAREAN SECTION     2004 and 2007  . MASTECTOMY W/ SENTINEL NODE BIOPSY Right 09/06/2016   Procedure: RIGHT BREAST MASTECTOMY WITH RIGHT AXILLARY SENTINEL LYMPH NODE BIOPSY;  Surgeon: Alphonsa Overall, MD;  Location: [facility];  Service: General;  Laterality: Right;  . PORT-A-CATH REMOVAL Left 09/06/2016   Procedure: REMOVAL PORT-A-CATH;  Surgeon: Alphonsa Overall, MD;  Location: [facility];  Service: General;  Laterality: Left;  . PORTACATH PLACEMENT       FAMILY HISTORY Family History  Problem Relation Age of Onset  . Hypertension Mother   . Cancer Mother        dx "intestinal cancer" in her 37s; +surgery  .  Other Mother        hysterectomy at young age for unspecified cause  . Heart Problems Mother   . Breast cancer Cousin        maternal 1st cousin dx female breast cancer at 39-46y  . Cancer Father   . Hypertension Father   . Heart Problems Maternal Aunt   . Diabetes  Maternal Aunt   . Breast cancer Maternal Uncle        dx 64-65  . Heart Problems Maternal Uncle   . Breast cancer Maternal Grandmother 69  . Throat cancer Maternal Grandfather        d. 35s; smoker  . Sickle cell anemia Paternal Aunt   . Congestive Heart Failure Maternal Aunt   . Multiple sclerosis Cousin   . Cancer Other        maternal great uncle (MGM's brother); cancer removed from his side  . Heart attack Paternal Aunt        d. early 67s    The patient has very little information about her father. Her mother is currently 70 years old. She had a history of cervical cancer at age 79. The patient had 2 brothers, no sisters. The maternal grandfather had throat cancer. A maternal uncle was diagnosed with breast cancer as well as prostate cancer at the age of 52. 2 maternal cousins, one of the mail, had breast cancer as well.  GYNECOLOGIC HISTORY:  No LMP recorded. Menarche age 43, first live birth age 14. The patient is GX P4. She was still having regular periods at the time of diagnosis. She took oral contraceptives in the 1990s with no side effects.--.  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 Her mother used to manage a group home but is now retired. The patient's significant other [name] works at [employer].  At home with the patient are her 3 children [name], [place] and [place]. There are age 45, 17, and 66 as of November 2019.  2 of them are disabled or have significant health problems, one with sickle cell disease, the other with autism and developmental delay.  The patient's son [name], currently 45 years old, lives in [place].    ADVANCED DIRECTIVES: Not in place   HEALTH MAINTENANCE: Social History   Socioeconomic History  . Marital status: Single    Spouse name: Not on file  . Number of children: 4  . Years of education:  Not on file  . Highest education level: Not on file  Occupational History  . Not on file  Social Needs  . Financial resource strain: Not on file  . Food insecurity:    Worry: Not on file    Inability: Not on file  . Transportation needs:    Medical: Not on file    Non-medical: Not on file  Tobacco Use  . Smoking status: Current Every Day Smoker    Packs/day: 0.25    Years: 20.00    Pack years: 5.00    Types: Cigarettes  . Smokeless tobacco: Never Used  . Tobacco comment: 1-2 ciagarettes daily.   Substance and Sexual Activity  . Alcohol use: Yes    Comment: occ  . Drug use: No  . Sexual activity: Not on file  Lifestyle  . Physical activity:    Days per week: Not on file  Minutes per session: Not on file  . Stress: Not on file  Relationships  . Social connections:    Talks on phone: Not on file    Gets together: Not on file    Attends religious service: Not on file    Active member of club or organization: Not on file    Attends meetings of clubs or organizations: Not on file    Relationship status: Not on file  Other Topics Concern  . Not on file  Social History Narrative  . Not on file     Colonoscopy:  PAP:  Bone density:  Lipid panel:  No Known Allergies  Current Outpatient Medications on File Prior to Visit  Medication Sig Dispense Refill  . gabapentin (NEURONTIN) 300 MG capsule Take 1 capsule (300 mg total) by mouth at bedtime. 90 capsule 4  . omeprazole (PRILOSEC) 20 MG capsule Take 1 capsule (20 mg total) by mouth daily. 30 capsule 1   No current facility-administered medications on file prior to visit.     OBJECTIVE:  There were no vitals filed for this visit. There is no height or weight on file to calculate BMI.   ECOG FS: 1 - Symptomatic but completely ambulatory GENERAL: Patient is a well appearing female in no acute distress HEENT:  Sclerae anicteric.  Oropharynx clear and moist. No ulcerations or evidence of oropharyngeal candidiasis.  Neck is supple.  NODES:  No cervical, supraclavicular, or axillary lymphadenopathy palpated.  BREAST EXAM:  Deferred. LUNGS:  Clear to auscultation bilaterally.  No wheezes or rhonchi. HEART:  Regular rate and rhythm. No murmur appreciated. ABDOMEN:  Soft, nontender.  Positive, normoactive bowel sounds. No organomegaly palpated. MSK:  No focal spinal tenderness to palpation. Full range of motion bilaterally in the upper extremities. EXTREMITIES:  No peripheral edema.   SKIN:  Clear with no obvious rashes or skin changes. No nail dyscrasia. NEURO:  Nonfocal. Well oriented.  Appropriate affect.    LAB RESULTS: CMP Latest Ref Rng & Units 10/11/2018 04/10/2018 02/25/2018  Glucose 70 - 99 mg/dL 106(H) 143(H) 100(H)  BUN 6 - 20 mg/dL _0 Creatinine 0.44 - 1.00 mg/dL 0.85 0.95 0.99  Sodium 135 - 145 mmol/L 143 140 139  Potassium 3.5 - 5.1 mmol/L 3.8 3.7 3.7  Chloride 98 - 111 mmol/L 108 106 104  CO2 22 - 32 mmol/L _1 Calcium 8.9 - 10.3 mg/dL 9.8 9.6 9.1  Total Protein 6.5 - 8.1 g/dL 7.8 7.7 -  Total Bilirubin 0.3 - 1.2 mg/dL 0.3 0.2 -  Alkaline Phos 38 - 126 U/L 83 93 -  AST 15 - 41 U/L 16 17 -  ALT 0 - 44 U/L 14 16 -     CBC    Component Value Date/Time   WBC 10.0 10/11/2018 1121   RBC 4.09 10/11/2018 1121   HGB 11.8 (L) 10/11/2018 1121   HGB 12.2 04/10/2018 0908   HGB 10.4 (L) 10/04/2016 1154   HCT 35.6 (L) 10/11/2018 1121   HCT 31.3 (L) 10/04/2016 1154   PLT 216 10/11/2018 1121   PLT 221 04/10/2018 0908   PLT 320 10/04/2016 1154   MCV 87.0 10/11/2018 1121   MCV 95.7 10/04/2016 1154   MCH 28.9 10/11/2018 1121   MCHC 33.1 10/11/2018 1121   RDW 14.4 10/11/2018 1121   RDW 16.3 (H) 10/04/2016 1154   LYMPHSABS 2.5 10/11/2018 1121   LYMPHSABS 1.7 10/04/2016 1154   MONOABS 0.6 10/11/2018 1121   MONOABS  0.5 10/04/2016 1154   EOSABS 0.2 10/11/2018 1121   EOSABS 0.3 10/04/2016 1154   BASOSABS 0.0 10/11/2018 1121   BASOSABS 0.0 10/04/2016 1154     STUDIES:   RESEARCH: Referred to PREVENT study, but was pregnant at the time; referred to [facility] 11202, but the biopsied lymph node was benign; referred to weight loss study but declined; referred to [facility] 12/09/2000, but the timing of radiation and 8 was greater than 60 days past the date of diagnosis; referred to health disparity study, but declined; referred to [id number] adjuvant therapy study, but the patient received Xeloda with radiation and therefore was ineligible  ASSESSMENT: 43 y.o. [place] woman status post right breast upper-outer quadrant biopsy 01/28/2016 for a clinical T2 N0 invasive ductal carcinoma, grade 3, triple negative, with an MIB-1 of 70%.  (a) suspicious right axillary lymph node biopsied 01/28/2016 was benign  (1) neoadjuvant chemotherapy: doxorubicin and cyclophosphamide in dose dense fashion 4 started 04/14/16, completed 05/26/2016, followed by paclitaxel and carboplatin weekly 12, Started 06/09/2016  (a) taxol discontinued after 7 doses because of neuropathy, last dose 07/21/2016  (2) genetics testing October 20, 2016 through the 32-gene Comprehensive Cancer Panel offered by [facility] Junius Roads, MD) (with MSH2 Exons 1-7 Inversion Analysis) found no deleterious mutations or VUSS  In APC, ATM, AXIN2, BARD1, BMPR1A, BRCA1, BRCA2, BRIP1, CDH1, CDK4, CDKN2A, CHEK2, EPCAM, FANCC, MLH1, MSH2, MSH6, MUTYH, NBN, PALB2, PMS2, POLD1, POLE, PTEN, RAD51C, RAD51D, SCG5/GREM1, SMAD4, STK11, TP53, VHL, and XRCC2.    (3) right mastectomy and sentinel lymph node sampling 09/06/2016 showed a residual pT1c pN0, invasive ductal carcinoma, grade 3, with negative margins. Repeat prognostic panel again triple negative   (4) adjuvant radiation with capecitabine/Xeloda sensitization 11/15/16 - 01/12/17 Site/dose:   Right Chest Wall and axilla (4 field) treated to 45 Gy in 25 fractions, and then Boosted an additional 14.4 Gy in 8 fractions.  (5) tobacco abuse  disorder: The patient quit smoking 04/04/2018  PLAN: [name] i  She knows to call for any problems that may develop before then.  Wilber Bihari, NP  01/16/19 7:04 AM Medical Oncology and Hematology [facility] [address] [place], [place] [address] Tel. [phone]    Fax. [phone]

## 2019-01-23 ENCOUNTER — Encounter: Payer: Self-pay | Admitting: Adult Health

## 2019-01-23 ENCOUNTER — Inpatient Hospital Stay: Payer: Medicaid Other | Attending: Oncology

## 2019-01-23 ENCOUNTER — Inpatient Hospital Stay (HOSPITAL_BASED_OUTPATIENT_CLINIC_OR_DEPARTMENT_OTHER): Payer: Medicaid Other | Admitting: Adult Health

## 2019-01-23 ENCOUNTER — Telehealth: Payer: Self-pay | Admitting: Adult Health

## 2019-01-23 VITALS — BP 133/95 | HR 74 | Temp 97.9°F | Resp 18 | Ht 61.5 in | Wt 221.1 lb

## 2019-01-23 DIAGNOSIS — Z87891 Personal history of nicotine dependence: Secondary | ICD-10-CM

## 2019-01-23 DIAGNOSIS — I10 Essential (primary) hypertension: Secondary | ICD-10-CM

## 2019-01-23 DIAGNOSIS — Z9011 Acquired absence of right breast and nipple: Secondary | ICD-10-CM

## 2019-01-23 DIAGNOSIS — G620 Drug-induced polyneuropathy: Secondary | ICD-10-CM

## 2019-01-23 DIAGNOSIS — M898X9 Other specified disorders of bone, unspecified site: Secondary | ICD-10-CM

## 2019-01-23 DIAGNOSIS — Z9221 Personal history of antineoplastic chemotherapy: Secondary | ICD-10-CM

## 2019-01-23 DIAGNOSIS — Z923 Personal history of irradiation: Secondary | ICD-10-CM | POA: Insufficient documentation

## 2019-01-23 DIAGNOSIS — Z171 Estrogen receptor negative status [ER-]: Secondary | ICD-10-CM | POA: Diagnosis not present

## 2019-01-23 DIAGNOSIS — C50411 Malignant neoplasm of upper-outer quadrant of right female breast: Secondary | ICD-10-CM | POA: Diagnosis not present

## 2019-01-23 DIAGNOSIS — T451X5A Adverse effect of antineoplastic and immunosuppressive drugs, initial encounter: Secondary | ICD-10-CM

## 2019-01-23 DIAGNOSIS — Z79899 Other long term (current) drug therapy: Secondary | ICD-10-CM

## 2019-01-23 LAB — COMPREHENSIVE METABOLIC PANEL
ALT: 12 U/L (ref 0–44)
AST: 13 U/L — ABNORMAL LOW (ref 15–41)
Albumin: 3.9 g/dL (ref 3.5–5.0)
Alkaline Phosphatase: 82 U/L (ref 38–126)
Anion gap: 6 (ref 5–15)
BUN: 12 mg/dL (ref 6–20)
CHLORIDE: 108 mmol/L (ref 98–111)
CO2: 27 mmol/L (ref 22–32)
Calcium: 9.2 mg/dL (ref 8.9–10.3)
Creatinine, Ser: 1.14 mg/dL — ABNORMAL HIGH (ref 0.44–1.00)
GFR calc non Af Amer: 58 mL/min — ABNORMAL LOW (ref >60)
Glucose, Bld: 85 mg/dL (ref 70–99)
POTASSIUM: 3.9 mmol/L (ref 3.5–5.1)
SODIUM: 141 mmol/L (ref 135–145)
Total Bilirubin: 0.3 mg/dL (ref 0.3–1.2)
Total Protein: 7.8 g/dL (ref 6.5–8.1)

## 2019-01-23 LAB — CBC WITH DIFFERENTIAL/PLATELET
ABS IMMATURE GRANULOCYTES: 0.04 10*3/uL (ref 0.00–0.07)
BASOS ABS: 0 10*3/uL (ref 0.0–0.1)
Basophils Relative: 0 %
EOS PCT: 1 %
Eosinophils Absolute: 0.2 10*3/uL (ref 0.0–0.5)
HCT: 34.9 % — ABNORMAL LOW (ref 36.0–46.0)
Hemoglobin: 11.5 g/dL — ABNORMAL LOW (ref 12.0–15.0)
IMMATURE GRANULOCYTES: 0 %
LYMPHS PCT: 21 %
Lymphs Abs: 2.3 10*3/uL (ref 0.7–4.0)
MCH: 28.5 pg (ref 26.0–34.0)
MCHC: 33 g/dL (ref 30.0–36.0)
MCV: 86.4 fL (ref 80.0–100.0)
Monocytes Absolute: 0.7 10*3/uL (ref 0.1–1.0)
Monocytes Relative: 7 %
NEUTROS ABS: 7.8 10*3/uL — AB (ref 1.7–7.7)
NEUTROS PCT: 71 %
NRBC: 0 % (ref 0.0–0.2)
Platelets: 222 10*3/uL (ref 150–400)
RBC: 4.04 MIL/uL (ref 3.87–5.11)
RDW: 14.4 % (ref 11.5–15.5)
WBC: 11.1 10*3/uL — AB (ref 4.0–10.5)

## 2019-01-23 NOTE — Telephone Encounter (Signed)
No los °

## 2019-01-23 NOTE — Progress Notes (Signed)
[place]  [phone] [phone]   ID: Rebecca [name] DOB: 20-Feb-1976  MR#: [id number]  XTA#:[id number]  Patient Care Team: Dorena Dew, FNP as PCP - General (Family Medicine) Alphonsa Overall, MD as Consulting Physician (General Surgery) Chauncey Cruel, MD as Consulting Physician (Oncology) Gery Pray, MD as Consulting Physician (Radiation Oncology) Sylvan Cheese, NP as Nurse Practitioner (Hematology and Oncology) Benson Norway, RN as Registered Nurse OTHER MD:  CHIEF COMPLAINT: triple negative breast cancer  CURRENT TREATMENT: Observation  BREAST CANCER HISTORY: From the original intake note:  Rebecca [name] herself noted a change in her right breast sometime around September or October 2016. She did not bring it to intermediate medical attention, but on 01/13/2016 she established herself in Dr. Smith Robert' service and she was set up for bilateral diagnostic mammography with tomosynthesis and bilateral ultrasonography at the [facility] 01/19/2016. The breast density was category B. In the upper outer quadrant of the right breast there was a spiculated mass measuring 2.8 cm. On physical exam this was palpable. Targeted ultrasonography confirmed an irregular hypoechoic mass in the right breast 11:30 o'clock position measuring 2.6 cm maximally. Ultrasound of the right axilla showed a morphologically abnormal lymph node.  In the left breast there were some tubular densities behind the areola which by ultrasonography showed benign ductal ectasia.  On 01/28/2016 Jahnay underwent biopsy of the right breast mass and abnormal right axillary lymph node. The pathology from this procedure (S AAA [id number]) showed the lymph node to be benign. In the breast however there was an invasive ductal carcinoma, grade 3, which was estrogen and progesterone receptor negative. The proliferation marker was 70%. HER-2 was not amplified with a signals ratio of 1.32. The number  per cell was 2.05.  The patient's subsequent history is as detailed below  INTERVAL HISTORY: Rebecca [name] returns today for follow-up of her triple negative breast cancer. She is under observation alone.   Rebecca [name] continues to have pain all over.  She notes neuropathy in her feet bilaterally, worse in her left foot.  She notes that the neuropathy is worsening.  She says she initially noted the neuropathy during chemotherapy.  She says that as she loses weight and exercises more, the neuropathy is worsening.  She occasionally has balance issues.  She says that the neuropathic pain is interfering with her ability to work.  She says she cannot stand for prolonged periods of time.  She works as a Sport and exercise psychologist for patients.  She takes Gabapentin at night, but doesn't take this regularly.  She wants to know how to obtain medical marijuana.     REVIEW OF SYSTEMS: Rebecca [name] is doing well otherwise.  She denies any unusual headaches or vision changes.  She is without fevers or chills.  She denies nausea, vomiting, bowel or bladder changes.  She hasn't noted any cough, chest pain, palpitations, or shortness of breath.  She is actively trying to lose weight.  She is down 9 pounds since 04/2018.  A detailed ROS was otherwise non contributory.     PAST MEDICAL HISTORY: Past Medical History:  Diagnosis Date  . Anxiety   . Breast cancer ([place])   . Depression   . GERD (gastroesophageal reflux disease)   . History of radiation therapy 11/15/16-01/12/17   right chest wall and axilla treated to 45 Gy in 25 fractions, boosted and additional 14 Gy in 8 fractions  . Hypertension    states losartan and coreg are for her heart  .  Obesity (BMI 35.0-39.9 without comorbidity)   . Seasonal allergies   . Sickle cell trait ([place])   . Termination of pregnancy (fetus) 04/02/16    PAST SURGICAL HISTORY: Past Surgical History:  Procedure Laterality Date  . CESAREAN SECTION     2004 and 2007  . MASTECTOMY W/ SENTINEL NODE BIOPSY  Right 09/06/2016   Procedure: RIGHT BREAST MASTECTOMY WITH RIGHT AXILLARY SENTINEL LYMPH NODE BIOPSY;  Surgeon: Alphonsa Overall, MD;  Location: [place];  Service: General;  Laterality: Right;  . PORT-A-CATH REMOVAL Left 09/06/2016   Procedure: REMOVAL PORT-A-CATH;  Surgeon: Alphonsa Overall, MD;  Location: [place];  Service: General;  Laterality: Left;  . PORTACATH PLACEMENT       FAMILY HISTORY Family History  Problem Relation Age of Onset  . Hypertension Mother   . Cancer Mother        dx "intestinal cancer" in her 34s; +surgery  . Other Mother        hysterectomy at young age for unspecified cause  . Heart Problems Mother   . Breast cancer Cousin        maternal 1st cousin dx female breast cancer at 28-46y  . Cancer Father   . Hypertension Father   . Heart Problems Maternal Aunt   . Diabetes Maternal Aunt   . Breast cancer Maternal Uncle        dx 64-65  . Heart Problems Maternal Uncle   . Breast cancer Maternal Grandmother 75  . Throat cancer Maternal Grandfather        d. 75s; smoker  . Sickle cell anemia Paternal Aunt   . Congestive Heart Failure Maternal Aunt   . Multiple sclerosis Cousin   . Cancer Other        maternal great uncle (MGM's brother); cancer removed from his side  . Heart attack Paternal Aunt        d. early 81s    The patient has very little information about her father. Her mother is currently 64 years old. She had a history of cervical cancer at age 30. The patient had 2 brothers, no sisters. The maternal grandfather had throat cancer. A maternal uncle was diagnosed with breast cancer as well as prostate cancer at the age of 48. 2 maternal cousins, one of the mail, had breast cancer as well.  GYNECOLOGIC HISTORY:  No LMP recorded. Menarche age 44, first live birth age 83. The patient is GX P4. She was still having regular periods at the time of diagnosis. She took oral contraceptives in the 1990s with no side effects.--.   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 Her mother used to manage a group home but is now retired. The patient's significant other [name] works at [employer].  At home with the patient are her 3 children [name], [place] and [place]. There are age 48, 9, and 70 as of November 2019.  2 of them are disabled or have significant health problems, one with sickle cell disease, the other with autism and developmental delay.  The patient's son [name], currently 57 years old, lives in [place].    ADVANCED DIRECTIVES: Not in place   HEALTH MAINTENANCE: Social History   Socioeconomic History  . Marital status: Single    Spouse name: Not on file  . Number of children:  4  . Years of education: Not on file  . Highest education level: Not on file  Occupational History  . Not on file  Social Needs  . Financial resource strain: Not on file  . Food insecurity:    Worry: Not on file    Inability: Not on file  . Transportation needs:    Medical: Not on file    Non-medical: Not on file  Tobacco Use  . Smoking status: Current Every Day Smoker    Packs/day: 0.25    Years: 20.00    Pack years: 5.00    Types: Cigarettes  . Smokeless tobacco: Never Used  . Tobacco comment: 1-2 ciagarettes daily.   Substance and Sexual Activity  . Alcohol use: Yes    Comment: occ  . Drug use: No  . Sexual activity: Not on file  Lifestyle  . Physical activity:    Days per week: Not on file    Minutes per session: Not on file  . Stress: Not on file  Relationships  . Social connections:    Talks on phone: Not on file    Gets together: Not on file    Attends religious service: Not on file    Active member of club or organization: Not on file    Attends meetings of clubs or organizations: Not on file    Relationship status: Not on file  Other Topics Concern  .  Not on file  Social History Narrative  . Not on file     Colonoscopy:  PAP:  Bone density:  Lipid panel:  No Known Allergies  Current Outpatient Medications on File Prior to Visit  Medication Sig Dispense Refill  . gabapentin (NEURONTIN) 300 MG capsule Take 1 capsule (300 mg total) by mouth at bedtime. 90 capsule 4  . omeprazole (PRILOSEC) 20 MG capsule Take 1 capsule (20 mg total) by mouth daily. 30 capsule 1   No current facility-administered medications on file prior to visit.     OBJECTIVE:  Vitals:   01/23/19 0950  BP: (!) 133/95  Pulse: 74  Resp: 18  Temp: 97.9 F (36.6 C)  SpO2: 100%   Body mass index is 41.1 kg/m.   ECOG FS: 1 - Symptomatic but completely ambulatory GENERAL: Patient is a well appearing female in no acute distress HEENT:  Sclerae anicteric.  Oropharynx clear and moist. No ulcerations or evidence of oropharyngeal candidiasis. Neck is supple.  NODES:  No cervical, supraclavicular, or axillary lymphadenopathy palpated.  BREAST EXAM:right breast s/p mastectomy, no sign of local recurrence, left breast benign LUNGS:  Clear to auscultation bilaterally.  No wheezes or rhonchi. HEART:  Regular rate and rhythm. No murmur appreciated. ABDOMEN:  Soft, nontender.  Positive, normoactive bowel sounds. No organomegaly palpated. MSK:  No focal spinal tenderness to palpation. Full range of motion bilaterally in the upper extremities. EXTREMITIES:  No peripheral edema.   SKIN:  Clear with no obvious rashes or skin changes. No nail dyscrasia. NEURO:  Nonfocal. Well oriented.  Appropriate affect.    LAB RESULTS: CMP Latest Ref Rng & Units 01/23/2019 10/11/2018 04/10/2018  Glucose 70 - 99 mg/dL 85 106(H) 143(H)  BUN 6 - 20 mg/dL 12 11 9   Creatinine 0.44 - 1.00 mg/dL 1.14(H) 0.85 0.95  Sodium 135 - 145 mmol/L 141 143 140  Potassium 3.5 - 5.1 mmol/L 3.9 3.8 3.7  Chloride 98 - 111 mmol/L 108 108 106  CO2 22 - 32 mmol/L 27 27 28   Calcium 8.9 - 10.3  mg/dL 9.2 9.8  9.6  Total Protein 6.5 - 8.1 g/dL 7.8 7.8 7.7  Total Bilirubin 0.3 - 1.2 mg/dL 0.3 0.3 0.2  Alkaline Phos 38 - 126 U/L 82 83 93  AST 15 - 41 U/L 13(L) 16 17  ALT 0 - 44 U/L 12 14 16      CBC    Component Value Date/Time   WBC 11.1 (H) 01/23/2019 0906   RBC 4.04 01/23/2019 0906   HGB 11.5 (L) 01/23/2019 0906   HGB 12.2 04/10/2018 0908   HGB 10.4 (L) 10/04/2016 1154   HCT 34.9 (L) 01/23/2019 0906   HCT 31.3 (L) 10/04/2016 1154   PLT 222 01/23/2019 0906   PLT 221 04/10/2018 0908   PLT 320 10/04/2016 1154   MCV 86.4 01/23/2019 0906   MCV 95.7 10/04/2016 1154   MCH 28.5 01/23/2019 0906   MCHC 33.0 01/23/2019 0906   RDW 14.4 01/23/2019 0906   RDW 16.3 (H) 10/04/2016 1154   LYMPHSABS 2.3 01/23/2019 0906   LYMPHSABS 1.7 10/04/2016 1154   MONOABS 0.7 01/23/2019 0906   MONOABS 0.5 10/04/2016 1154   EOSABS 0.2 01/23/2019 0906   EOSABS 0.3 10/04/2016 1154   BASOSABS 0.0 01/23/2019 0906   BASOSABS 0.0 10/04/2016 1154    STUDIES:   RESEARCH: Referred to PREVENT study, but was pregnant at the time; referred to [facility] 11202, but the biopsied lymph node was benign; referred to weight loss study but declined; referred to [facility] 12/09/2000, but the timing of radiation and 8 was greater than 60 days past the date of diagnosis; referred to health disparity study, but declined; referred to [id number] adjuvant therapy study, but the patient received Xeloda with radiation and therefore was ineligible  ASSESSMENT: 43 y.o. Rebecca [name] status post right breast upper-outer quadrant biopsy 01/28/2016 for a clinical T2 N0 invasive ductal carcinoma, grade 3, triple negative, with an MIB-1 of 70%.  (a) suspicious right axillary lymph node biopsied 01/28/2016 was benign  (1) neoadjuvant chemotherapy: doxorubicin and cyclophosphamide in dose dense fashion 4 started 04/14/16, completed 05/26/2016, followed by paclitaxel and carboplatin weekly 12, Started 06/09/2016  (a) taxol discontinued  after 7 doses because of neuropathy, last dose 07/21/2016  (2) genetics testing October 20, 2016 through the 32-gene Comprehensive Cancer Panel offered by [facility] Junius Roads, MD) (with MSH2 Exons 1-7 Inversion Analysis) found no deleterious mutations or VUSS  In APC, ATM, AXIN2, BARD1, BMPR1A, BRCA1, BRCA2, BRIP1, CDH1, CDK4, CDKN2A, CHEK2, EPCAM, FANCC, MLH1, MSH2, MSH6, MUTYH, NBN, PALB2, PMS2, POLD1, POLE, PTEN, RAD51C, RAD51D, SCG5/GREM1, SMAD4, STK11, TP53, VHL, and XRCC2.    (3) right mastectomy and sentinel lymph node sampling 09/06/2016 showed a residual pT1c pN0, invasive ductal carcinoma, grade 3, with negative margins. Repeat prognostic panel again triple negative   (4) adjuvant radiation with capecitabine/Xeloda sensitization 11/15/16 - 01/12/17 Site/dose:   Right Chest Wall and axilla (4 field) treated to 45 Gy in 25 fractions, and then Boosted an additional 14.4 Gy in 8 fractions.  (5) tobacco abuse disorder: The patient quit smoking 04/04/2018  PLAN: Rebecca [name] is doing moderately well today.  She has quit smoking and losing weight and I congratulated her on her success in this.  I encouraged her to continue with healthy diet and exercise.    She will be due for mammogram in 09/2019.  Due to her pain all over, I ordered a bone scan for full work up.  This is questionably related to her marked increase in physical exertion.  She also has an increase in peripheral neuropathy.  This is unusual with her last chemo being in 2017.  I suggested she see neurology to rule out any other cause of this increase.  She was recommended to take Gabapentin QHS.  We will get the above things done, and she will see [place] back in 3 months for labs and f/u.  She knows to call for any problems that may develop before then.  A total of (30) minutes of face-to-face time was spent with this patient with greater than 50% of that time in counseling and care-coordination.   Wilber Bihari, NP   01/23/19 10:00 AM Medical Oncology and Hematology [facility] [address] [place], [place] [address] Tel. [phone]    Fax. [phone]

## 2019-01-24 ENCOUNTER — Telehealth: Payer: Self-pay

## 2019-01-24 ENCOUNTER — Encounter: Payer: Self-pay | Admitting: Neurology

## 2019-01-24 NOTE — Telephone Encounter (Signed)
Spoke with patient to inform her that kidney function is slightly elevated.  Encouraged patient to increase fluid intake.  Labs faxed to PCP in patient chart.

## 2019-01-24 NOTE — Telephone Encounter (Signed)
-----   Message from Gardenia Phlegm, NP sent at 01/24/2019  2:04 PM EST ----- Patient kidney function is slightly elevated.  She needs to increase fluid intake.  Please fax to PCP as well.  ----- Message ----- From: Buel Ream, Lab In [place] Sent: 01/23/2019   9:21 AM EST To: Chauncey Cruel, MD

## 2019-03-08 ENCOUNTER — Encounter: Payer: Self-pay | Admitting: Neurology

## 2019-03-08 ENCOUNTER — Telehealth (INDEPENDENT_AMBULATORY_CARE_PROVIDER_SITE_OTHER): Payer: Medicaid Other | Admitting: Neurology

## 2019-03-08 ENCOUNTER — Other Ambulatory Visit: Payer: Self-pay

## 2019-03-08 VITALS — Ht 61.5 in | Wt 217.0 lb

## 2019-03-08 DIAGNOSIS — R202 Paresthesia of skin: Secondary | ICD-10-CM | POA: Diagnosis not present

## 2019-03-08 DIAGNOSIS — M79601 Pain in right arm: Secondary | ICD-10-CM | POA: Diagnosis not present

## 2019-03-08 DIAGNOSIS — M79602 Pain in left arm: Secondary | ICD-10-CM

## 2019-03-08 DIAGNOSIS — M79671 Pain in right foot: Secondary | ICD-10-CM

## 2019-03-08 DIAGNOSIS — M79672 Pain in left foot: Secondary | ICD-10-CM

## 2019-03-08 DIAGNOSIS — G5602 Carpal tunnel syndrome, left upper limb: Secondary | ICD-10-CM

## 2019-03-08 MED ORDER — GABAPENTIN 300 MG PO CAPS: ORAL_CAPSULE | ORAL | 3 refills | Status: DC

## 2019-03-08 NOTE — Progress Notes (Signed)
New Patient Virtual Visit via Video Note The purpose of this virtual visit is to provide medical care while limiting exposure to the novel coronavirus.    Consent was obtained for video visit:  Yes.   Answered questions that patient had about telehealth interaction:  Yes.   I discussed the limitations, risks, security and privacy concerns of performing an evaluation and management service by telemedicine. I also discussed with the patient that there may be a patient responsible charge related to this service. The patient expressed understanding and agreed to proceed.  Pt location: Home Physician Location: office Name of referring provider:  Wilber Bihari [name]* I connected with Rebecca [name] at patients initiation/request on 03/08/2019 at 11:00 AM EDT by video enabled telemedicine application and verified that I am speaking with the correct person using two identifiers. Pt MRN:  [id number] Pt DOB:  January 28, 1976 Video Participants:  Rebecca [name]  History of Present Illness: Rebecca [name] is a 43 y.o. left-handed African American female with right breast cancer s/p mastectomy, chemotherapy (2017), depression/anxiety, GERD, and hypertension presenting for evaluation of bilateral hand and feet pain.   She underwent chemotherapy during the summer of 2017 for right breast cancer which included doxorubicin, cyclophosphamide, paclitaxel, and carboplatin.  Paclitaxel was discontinued early due to neuropathy.  She was also treated with Xeloda in late 2017 - early 2018.  Since 2017, she has had bilateral hand and feet pain.  She describes tingling in the hands, which is worse with prolonged activity. She often finds herself having to shake her hands to wake them up.  Sometimes, it wakes her up from sleeping.  She has weakness opening jars/bottles. Overall, this has improved over the past several years but still bothers her several times per week.  Her feet pain is mostly localized over the heel, described  as dull and achy.  It is worse with weight bearing, such as walking.  It is exacerbated when she does not wear shoes.  Pain is relieved with rest and elevating her feet.  She does not have numbness/tingling. No imbalance, falls, or weakness.  She walks unassisted.   Out-side paper records, electronic medical record, and images have been reviewed where available and summarized as:  Lab Results  Component Value Date   HGBA1C 5.7 (H) 01/12/2016   Lab Results  Component Value Date   TSH 0.80 01/12/2016     Past Medical History:  Diagnosis Date  . Anxiety   . Breast cancer ([place])   . Depression   . GERD (gastroesophageal reflux disease)   . History of radiation therapy 11/15/16-01/12/17   right chest wall and axilla treated to 45 Gy in 25 fractions, boosted and additional 14 Gy in 8 fractions  . Hypertension    states losartan and coreg are for her heart  . Obesity (BMI 35.0-39.9 without comorbidity)   . Seasonal allergies   . Sickle cell trait ([place])   . Termination of pregnancy (fetus) 04/02/16    Past Surgical History:  Procedure Laterality Date  . CESAREAN SECTION     2004 and 2007  . MASTECTOMY W/ SENTINEL NODE BIOPSY Right 09/06/2016   Procedure: RIGHT BREAST MASTECTOMY WITH RIGHT AXILLARY SENTINEL LYMPH NODE BIOPSY;  Surgeon: Alphonsa Overall, MD;  Location: [place];  Service: General;  Laterality: Right;  . PORT-A-CATH REMOVAL Left 09/06/2016   Procedure: REMOVAL PORT-A-CATH;  Surgeon: Alphonsa Overall, MD;  Location: [place];  Service: General;  Laterality: Left;  . PORTACATH PLACEMENT  Medications:  Outpatient Encounter Medications as of 03/08/2019  Medication Sig  . gabapentin (NEURONTIN) 300 MG capsule Take 1 tablet at bedtime for one week, then increase to 1 tablet morning and bedtime.  Marland Kitchen omeprazole (PRILOSEC) 20 MG capsule Take 1 capsule (20 mg total) by mouth daily.  . [DISCONTINUED] gabapentin (NEURONTIN) 300 MG capsule Take 1 capsule  (300 mg total) by mouth at bedtime.   No facility-administered encounter medications on file as of 03/08/2019.     Allergies: No Known Allergies  Family History: Family History  Problem Relation Age of Onset  . Hypertension Mother   . Cancer Mother        dx "intestinal cancer" in her 97s; +surgery  . Other Mother        hysterectomy at young age for unspecified cause  . Heart Problems Mother   . Breast cancer Cousin        maternal 1st cousin dx female breast cancer at 5-46y  . Cancer Father   . Hypertension Father   . Heart Problems Maternal Aunt   . Diabetes Maternal Aunt   . Breast cancer Maternal Uncle        dx 64-65  . Heart Problems Maternal Uncle   . Breast cancer Maternal Grandmother 62  . Throat cancer Maternal Grandfather        d. 84s; smoker  . Sickle cell anemia Paternal Aunt   . Congestive Heart Failure Maternal Aunt   . Multiple sclerosis Cousin   . Cancer Other        maternal great uncle (MGM's brother); cancer removed from his side  . Heart attack Paternal Aunt        d. early 32s    Social History: Social History   Tobacco Use  . Smoking status: Current Every Day Smoker    Packs/day: 0.25    Years: 20.00    Pack years: 5.00    Types: Cigarettes  . Smokeless tobacco: Never Used  . Tobacco comment: 1-2 ciagarettes daily.   Substance Use Topics  . Alcohol use: Yes    Comment: occ  . Drug use: No   Social History   Social History Narrative  . Not on file    Review of Systems:  CONSTITUTIONAL: No fevers, chills, night sweats, or weight loss.   EYES: No visual changes or eye pain ENT: No hearing changes.  No history of nose bleeds.   RESPIRATORY: No cough, wheezing and shortness of breath.   CARDIOVASCULAR: Negative for chest pain, and palpitations.   GI: Negative for abdominal discomfort, blood in stools or black stools.  No recent change in bowel habits.   GU:  No history of incontinence.   MUSCLOSKELETAL: +history of joint pain or  swelling.  No myalgias.   SKIN: Negative for lesions, rash, and itching.   HEMATOLOGY/ONCOLOGY: Negative for prolonged bleeding, bruising easily, and swollen nodes.  No history of cancer.   ENDOCRINE: Negative for cold or heat intolerance, polydipsia or goiter.   PSYCH:  No depression or anxiety symptoms.   NEURO: As Above.   Vital Signs:  Ht 5' 1.5" (1.562 m)   Wt 217 lb (98.4 kg)   BMI 40.34 kg/m    General Medical Exam:   Well appearing, comfortable.  Nonlabored breathing. No edema.  Neurological Exam: MENTAL STATUS including orientation to time, place, person, recent and remote memory, attention span and concentration, language, and fund of knowledge is normal.  Speech is not dysarthric.  CRANIAL NERVES:  Normal conjugate, extra-ocular eye movements in all directions of gaze.  No ptosis.   Normal facial symmetry and movements.  Normal shoulder shrug and head rotation.  Tongue is midline.  MOTOR:  She is antigravity in all extremities.  No atrophy of the hands.  No abnormal movements. No pronator drift.   SENSORY:   Romberg's sign absent. Positive Prayer testing on the left.   COORDINATION/GAIT:  Intact rapid alternating movements bilaterally.  Able to rise from a chair without using arms.  Gait narrow based and stable.  IMPRESSION: 1.  Bilateral hand paresthesias, worse on the left.  Symptoms are most suggestive of carpal tunnel syndrome especially given positive provocative testing on the left.   2.  Bilateral heel pain, worse with weigh bearing is suggestive of MSK pain. ?tendonitis   3.  History of chemotherapy-induced neuropathy.  Current symptomology is not suggestive of ongoing neuropathy. Unfortunately, due to video visit, neurological exam is very limited.    PLAN/RECOMMENDATIONS:  1.  To better characterize the nature of her symptoms, plan for NCS/EMG of the left arm and leg.  If there is evidence of neuropathy, she will need additional lab testing for paresthesias 2.   Increase gabapentin to 300mg  BID x 1 week, then 300mg  TID 3.  Start using a wrist splint on the left hand during the day and night for possible carpal tunnel syndrome   Follow Up Instructions:   I discussed the assessment and treatment plan with the patient. The patient was provided an opportunity to ask questions and all were answered. The patient agreed with the plan and demonstrated an understanding of the instructions.   The patient was advised to call back or seek an in-person evaluation if the symptoms worsen or if the condition fails to improve as anticipated.  Follow-up after testing    Alda Berthold, DO

## 2019-03-08 NOTE — Progress Notes (Signed)
I will add her to the scheduling list.

## 2019-03-13 ENCOUNTER — Encounter: Payer: Self-pay | Admitting: *Deleted

## 2019-04-10 ENCOUNTER — Ambulatory Visit: Payer: Medicaid Other | Admitting: Neurology

## 2019-04-24 ENCOUNTER — Encounter: Payer: Self-pay | Admitting: Neurology

## 2019-05-10 ENCOUNTER — Other Ambulatory Visit: Payer: Self-pay

## 2019-05-10 DIAGNOSIS — M79671 Pain in right foot: Secondary | ICD-10-CM

## 2019-05-10 DIAGNOSIS — G5602 Carpal tunnel syndrome, left upper limb: Secondary | ICD-10-CM

## 2019-05-10 DIAGNOSIS — M79672 Pain in left foot: Secondary | ICD-10-CM

## 2019-05-10 DIAGNOSIS — M79601 Pain in right arm: Secondary | ICD-10-CM

## 2019-05-21 NOTE — Progress Notes (Signed)
No show

## 2019-05-22 ENCOUNTER — Inpatient Hospital Stay: Payer: Medicaid Other | Attending: Oncology

## 2019-05-22 ENCOUNTER — Encounter: Payer: Self-pay | Admitting: Oncology

## 2019-05-22 ENCOUNTER — Inpatient Hospital Stay (HOSPITAL_BASED_OUTPATIENT_CLINIC_OR_DEPARTMENT_OTHER): Payer: Medicaid Other | Admitting: Oncology

## 2019-05-22 DIAGNOSIS — F329 Major depressive disorder, single episode, unspecified: Secondary | ICD-10-CM | POA: Insufficient documentation

## 2019-05-22 DIAGNOSIS — Z853 Personal history of malignant neoplasm of breast: Secondary | ICD-10-CM | POA: Insufficient documentation

## 2019-05-22 DIAGNOSIS — C50411 Malignant neoplasm of upper-outer quadrant of right female breast: Secondary | ICD-10-CM

## 2019-05-22 DIAGNOSIS — K219 Gastro-esophageal reflux disease without esophagitis: Secondary | ICD-10-CM | POA: Insufficient documentation

## 2019-05-22 DIAGNOSIS — Z800 Family history of malignant neoplasm of digestive organs: Secondary | ICD-10-CM | POA: Insufficient documentation

## 2019-05-22 DIAGNOSIS — Z809 Family history of malignant neoplasm, unspecified: Secondary | ICD-10-CM | POA: Insufficient documentation

## 2019-05-22 DIAGNOSIS — Z803 Family history of malignant neoplasm of breast: Secondary | ICD-10-CM | POA: Insufficient documentation

## 2019-05-22 DIAGNOSIS — Z923 Personal history of irradiation: Secondary | ICD-10-CM | POA: Insufficient documentation

## 2019-05-22 DIAGNOSIS — R0789 Other chest pain: Secondary | ICD-10-CM | POA: Insufficient documentation

## 2019-05-22 DIAGNOSIS — Z9011 Acquired absence of right breast and nipple: Secondary | ICD-10-CM | POA: Insufficient documentation

## 2019-05-22 DIAGNOSIS — E669 Obesity, unspecified: Secondary | ICD-10-CM | POA: Insufficient documentation

## 2019-05-22 DIAGNOSIS — I10 Essential (primary) hypertension: Secondary | ICD-10-CM | POA: Insufficient documentation

## 2019-05-22 DIAGNOSIS — D573 Sickle-cell trait: Secondary | ICD-10-CM | POA: Insufficient documentation

## 2019-05-22 DIAGNOSIS — Z171 Estrogen receptor negative status [ER-]: Secondary | ICD-10-CM

## 2019-06-03 ENCOUNTER — Telehealth: Payer: Self-pay | Admitting: *Deleted

## 2019-06-03 NOTE — Telephone Encounter (Signed)
Pt left vm stating that she would like an appt today, tues, or wed because of pain in her chest where mastectomy was done.  Returned call to pt & received vm.  Unable to leave message since vm full.  Message routed to Dr Magrinat/Pod RN

## 2019-06-04 ENCOUNTER — Encounter: Payer: Self-pay | Admitting: Adult Health

## 2019-06-04 ENCOUNTER — Other Ambulatory Visit: Payer: Self-pay

## 2019-06-04 ENCOUNTER — Inpatient Hospital Stay (HOSPITAL_BASED_OUTPATIENT_CLINIC_OR_DEPARTMENT_OTHER): Payer: Medicaid Other | Admitting: Adult Health

## 2019-06-04 ENCOUNTER — Inpatient Hospital Stay: Payer: Medicaid Other

## 2019-06-04 VITALS — BP 141/85 | HR 104 | Temp 98.9°F | Resp 18 | Ht 61.5 in | Wt 221.1 lb

## 2019-06-04 DIAGNOSIS — C50411 Malignant neoplasm of upper-outer quadrant of right female breast: Secondary | ICD-10-CM

## 2019-06-04 DIAGNOSIS — Z9011 Acquired absence of right breast and nipple: Secondary | ICD-10-CM

## 2019-06-04 DIAGNOSIS — Z853 Personal history of malignant neoplasm of breast: Secondary | ICD-10-CM

## 2019-06-04 DIAGNOSIS — Z923 Personal history of irradiation: Secondary | ICD-10-CM

## 2019-06-04 DIAGNOSIS — D573 Sickle-cell trait: Secondary | ICD-10-CM

## 2019-06-04 DIAGNOSIS — Z803 Family history of malignant neoplasm of breast: Secondary | ICD-10-CM | POA: Diagnosis not present

## 2019-06-04 DIAGNOSIS — E669 Obesity, unspecified: Secondary | ICD-10-CM

## 2019-06-04 DIAGNOSIS — Z800 Family history of malignant neoplasm of digestive organs: Secondary | ICD-10-CM | POA: Diagnosis not present

## 2019-06-04 DIAGNOSIS — Z809 Family history of malignant neoplasm, unspecified: Secondary | ICD-10-CM

## 2019-06-04 DIAGNOSIS — R0789 Other chest pain: Secondary | ICD-10-CM

## 2019-06-04 DIAGNOSIS — K219 Gastro-esophageal reflux disease without esophagitis: Secondary | ICD-10-CM

## 2019-06-04 DIAGNOSIS — Z171 Estrogen receptor negative status [ER-]: Secondary | ICD-10-CM

## 2019-06-04 DIAGNOSIS — F329 Major depressive disorder, single episode, unspecified: Secondary | ICD-10-CM | POA: Diagnosis not present

## 2019-06-04 DIAGNOSIS — I10 Essential (primary) hypertension: Secondary | ICD-10-CM

## 2019-06-04 LAB — CBC WITH DIFFERENTIAL (CANCER CENTER ONLY)
Abs Immature Granulocytes: 0.03 10*3/uL (ref 0.00–0.07)
Basophils Absolute: 0 10*3/uL (ref 0.0–0.1)
Basophils Relative: 0 %
Eosinophils Absolute: 0.2 10*3/uL (ref 0.0–0.5)
Eosinophils Relative: 2 %
HCT: 33.7 % — ABNORMAL LOW (ref 36.0–46.0)
Hemoglobin: 11.1 g/dL — ABNORMAL LOW (ref 12.0–15.0)
Immature Granulocytes: 0 %
Lymphocytes Relative: 21 %
Lymphs Abs: 2.5 10*3/uL (ref 0.7–4.0)
MCH: 27.9 pg (ref 26.0–34.0)
MCHC: 32.9 g/dL (ref 30.0–36.0)
MCV: 84.7 fL (ref 80.0–100.0)
Monocytes Absolute: 0.9 10*3/uL (ref 0.1–1.0)
Monocytes Relative: 7 %
Neutro Abs: 8.6 10*3/uL — ABNORMAL HIGH (ref 1.7–7.7)
Neutrophils Relative %: 70 %
Platelet Count: 243 10*3/uL (ref 150–400)
RBC: 3.98 MIL/uL (ref 3.87–5.11)
RDW: 14.5 % (ref 11.5–15.5)
WBC Count: 12.3 10*3/uL — ABNORMAL HIGH (ref 4.0–10.5)
nRBC: 0 % (ref 0.0–0.2)

## 2019-06-04 LAB — CMP (CANCER CENTER ONLY)
ALT: 11 U/L (ref 0–44)
AST: 15 U/L (ref 15–41)
Albumin: 3.8 g/dL (ref 3.5–5.0)
Alkaline Phosphatase: 78 U/L (ref 38–126)
Anion gap: 8 (ref 5–15)
BUN: 8 mg/dL (ref 6–20)
CO2: 27 mmol/L (ref 22–32)
Calcium: 9.2 mg/dL (ref 8.9–10.3)
Chloride: 108 mmol/L (ref 98–111)
Creatinine: 0.82 mg/dL (ref 0.44–1.00)
GFR, Est AFR Am: >60 mL/min (ref >60)
GFR, Estimated: >60 mL/min (ref >60)
Glucose, Bld: 96 mg/dL (ref 70–99)
Potassium: 3.7 mmol/L (ref 3.5–5.1)
Sodium: 143 mmol/L (ref 135–145)
Total Bilirubin: 0.2 mg/dL — ABNORMAL LOW (ref 0.3–1.2)
Total Protein: 7.9 g/dL (ref 6.5–8.1)

## 2019-06-04 MED ORDER — TRAMADOL HCL 50 MG PO TABS: 50.0000 mg | ORAL_TABLET | Freq: Four times a day (QID) | ORAL | 0 refills | Status: DC | PRN

## 2019-06-04 NOTE — Patient Instructions (Signed)
Tramadol tablets What is this medicine? TRAMADOL (TRA ma dole) is a pain reliever. It is used to treat moderate to severe pain in adults. This medicine may be used for other purposes; ask your health care provider or pharmacist if you have questions. COMMON BRAND NAME(S): Ultram What should I tell my health care provider before I take this medicine? They need to know if you have any of these conditions:  brain tumor  depression  drug abuse or addiction  head injury  if you frequently drink alcohol containing drinks  kidney disease or trouble passing urine  liver disease  lung disease, asthma, or breathing problems  seizures or epilepsy  suicidal thoughts, plans, or attempt; a previous suicide attempt by you or a family member  an unusual or allergic reaction to tramadol, codeine, other medicines, foods, dyes, or preservatives  pregnant or trying to get pregnant  breast-feeding How should I use this medicine? Take this medicine by mouth with a full glass of water. Follow the directions on the prescription label. You can take it with or without food. If it upsets your stomach, take it with food. Do not take your medicine more often than directed. A special MedGuide will be given to you by the pharmacist with each prescription and refill. Be sure to read this information carefully each time. Talk to your pediatrician regarding the use of this medicine in children. Special care may be needed. Overdosage: If you think you have taken too much of this medicine contact a poison control center or emergency room at once. NOTE: This medicine is only for you. Do not share this medicine with others. What if I miss a dose? If you miss a dose, take it as soon as you can. If it is almost time for your next dose, take only that dose. Do not take double or extra doses. What may interact with this medicine? Do not take this medication with any of the following medicines:  MAOIs like Carbex,  Eldepryl, Marplan, Nardil, and Parnate This medicine may also interact with the following medications:  alcohol  antihistamines for allergy, cough and cold  certain medicines for anxiety or sleep  certain medicines for depression like amitriptyline, fluoxetine, sertraline  certain medicines for migraine headache like almotriptan, eletriptan, frovatriptan, naratriptan, rizatriptan, sumatriptan, zolmitriptan  certain medicines for seizures like carbamazepine, oxcarbazepine, phenobarbital, primidone  certain medicines that treat or prevent blood clots like warfarin  digoxin  furazolidone  general anesthetics like halothane, isoflurane, methoxyflurane, propofol  linezolid  local anesthetics like lidocaine, pramoxine, tetracaine  medicines that relax muscles for surgery  other narcotic medicines for pain or cough  phenothiazines like chlorpromazine, mesoridazine, prochlorperazine, thioridazine  procarbazine This list may not describe all possible interactions. Give your health care provider a list of all the medicines, herbs, non-prescription drugs, or dietary supplements you use. Also tell them if you smoke, drink alcohol, or use illegal drugs. Some items may interact with your medicine. What should I watch for while using this medicine? Tell your doctor or health care provider if your pain does not go away, if it gets worse, or if you have new or a different type of pain. You may develop tolerance to the medicine. Tolerance means that you will need a higher dose of the medicine for pain relief. Tolerance is normal and is expected if you take this medicine for a long time. This medicine may cause serious skin reactions. They can happen weeks to months after starting the medicine. Contact  your health care provider right away if you notice fevers or flu-like symptoms with a rash. The rash may be red or purple and then turn into blisters or peeling of the skin. Or, you might notice a  red rash with swelling of the face, lips or lymph nodes in your neck or under your arms. Do not suddenly stop taking your medicine because you may develop a severe reaction. Your body becomes used to the medicine. This does NOT mean you are addicted. Addiction is a behavior related to getting and using a drug for a non-medical reason. If you have pain, you have a medical reason to take pain medicine. Your doctor will tell you how much medicine to take. If your doctor wants you to stop the medicine, the dose will be slowly lowered over time to avoid any side effects. There are different types of narcotic medicines (opiates). If you take more than one type at the same time or if you are taking another medicine that also causes drowsiness, you may have more side effects. Give your health care provider a list of all medicines you use. Your doctor will tell you how much medicine to take. Do not take more medicine than directed. Call emergency for help if you have problems breathing or unusual sleepiness. You may get drowsy or dizzy. Do not drive, use machinery, or do anything that needs mental alertness until you know how this medicine affects you. Do not stand or sit up quickly, especially if you are an older patient. This reduces the risk of dizzy or fainting spells. Alcohol can increase or decrease the effects of this medicine. Avoid alcoholic drinks. You may have constipation. Try to have a bowel movement at least every 2 to 3 days. If you do not have a bowel movement for 3 days, call your doctor or health care provider. Your mouth may get dry. Chewing sugarless gum or sucking hard candy, and drinking plenty of water may help. Contact your doctor if the problem does not go away or is severe. What side effects may I notice from receiving this medicine? Side effects that you should report to your doctor or health care professional as soon as possible:  allergic reactions like skin rash, itching or hives,  swelling of the face, lips, or tongue  breathing problems  confusion  redness, blistering, peeling or loosening of the skin, including inside the mouth  seizures  signs and symptoms of low blood pressure like dizziness; feeling faint or lightheaded, falls; unusually weak or tired  trouble passing urine or change in the amount of urine Side effects that usually do not require medical attention (report to your doctor or health care professional if they continue or are bothersome):  constipation  dry mouth  nausea, vomiting  tiredness This list may not describe all possible side effects. Call your doctor for medical advice about side effects. You may report side effects to FDA at 1-800-FDA-1088. Where should I keep my medicine? Keep out of the reach of children. This medicine may cause accidental overdose and death if it taken by other adults, children, or pets. Mix any unused medicine with a substance like cat litter or coffee grounds. Then throw the medicine away in a sealed container like a sealed bag or a coffee can with a lid. Do not use the medicine after the expiration date. Store at room temperature between 15 and 30 degrees C (59 and 86 degrees F). NOTE: This sheet is a summary. It may  not cover all possible information. If you have questions about this medicine, talk to your doctor, pharmacist, or health care provider.  2020 Elsevier/Gold Standard (2019-03-01 15:56:48)

## 2019-06-04 NOTE — Progress Notes (Signed)
[place]  [phone] Fax:(336) [phone]   ID: Rebecca [name] DOB: 29-Jan-1976  MR#: [id number]  [id number]#:[id number]  Patient Care Team: Dorena Dew, FNP as PCP - General (Family Medicine) Alphonsa Overall, MD as Consulting Physician (General Surgery) Chauncey Cruel, MD as Consulting Physician (Oncology) Gery Pray, MD as Consulting Physician (Radiation Oncology) Sylvan Cheese, NP as Nurse Practitioner (Hematology and Oncology) Benson Norway, RN as Registered Nurse OTHER MD:  CHIEF COMPLAINT: triple negative breast cancer  CURRENT TREATMENT: Observation  BREAST CANCER HISTORY: From the original intake note:  Norris herself noted a change in her right breast sometime around September or October 2016. She did not bring it to intermediate medical attention, but on 01/13/2016 she established herself in Dr. Smith Robert' service and she was set up for bilateral diagnostic mammography with tomosynthesis and bilateral ultrasonography at the [facility] 01/19/2016. The breast density was category B. In the upper outer quadrant of the right breast there was a spiculated mass measuring 2.8 cm. On physical exam this was palpable. Targeted ultrasonography confirmed an irregular hypoechoic mass in the right breast 11:30 o'clock position measuring 2.6 cm maximally. Ultrasound of the right axilla showed a morphologically abnormal lymph node.  In the left breast there were some tubular densities behind the areola which by ultrasonography showed benign ductal ectasia.  On 01/28/2016 [name] underwent biopsy of the right breast mass and abnormal right axillary lymph node. The pathology from this procedure (S AAA [id number]) showed the lymph node to be benign. In the breast however there was an invasive ductal carcinoma, grade 3, which was estrogen and progesterone receptor negative. The proliferation marker was 70%. HER-2 was not amplified with a signals ratio of 1.32. The number  per cell was 2.05.  The patient's subsequent history is as detailed below  INTERVAL HISTORY: Rebecca [name] returns today for follow-up of her triple negative breast cancer. She is under observation alone.   [facility] underwent neurology evaluation with Dr. Posey Pronto in 03/2019.  I referred her in 01/2019.  She was recommended to undergo EMG, increase her gabapentin, and to wear a wrist brace for possible carpal tunnel.  The pain in her feet was not suggestive of neuropathy, but rather tendonitis or musculoskeletal origin.    Her most recent mammogram was in 09/2018 of her left breast.  It was negative for malignancy.  She had breast density category B.    At her last visit in 01/2019 she was having pain all over.  A bone scan was ordered but not scheduled.  She does not recall receiving a phone call to schedule this.  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  REVIEW OF SYSTEMS: [name] has noted an increase in right chest wall pain.  This started last week.  It is radiating from her anterior chest wall posteriorly.  She notes that she has developed accompanying swelling at her mastectomy site.  She has had a mild NP cough.  She denies any fever, chills, shortness of breath.  She denies any swelling in the right arm.  [name] has tried Ibuprofen for the pain, and it has not helped alleviate it.  She notes her pain is constant and varies in severity.  It is made worse by movement and palpation.  At its worst it is a 10.  At its best it  is a 5.    [name] has continued to exercise.  She continues to abstain from tobacco use since her quit date of cigarettes in 04/2018.  She is working on her diet.  [name] notes she still has pain in her legs and feet.  She says it is not as bad as it was.  She says she sometimes feels like 43 old woman.  Otherwise, [name] is doing well and a detailed ROS  was non contributory.   PAST MEDICAL HISTORY: Past Medical History:  Diagnosis Date  . Anxiety   . Breast cancer ([place])   . Depression   . GERD (gastroesophageal reflux disease)   . History of radiation therapy 11/15/16-01/12/17   right chest wall and axilla treated to 45 Gy in 25 fractions, boosted and additional 14 Gy in 8 fractions  . Hypertension    states losartan and coreg are for her heart  . Obesity (BMI 35.0-39.9 without comorbidity)   . Seasonal allergies   . Sickle cell trait ([place])   . Termination of pregnancy (fetus) 04/02/16    PAST SURGICAL HISTORY: Past Surgical History:  Procedure Laterality Date  . CESAREAN SECTION     2004 and 2007  . MASTECTOMY W/ SENTINEL NODE BIOPSY Right 09/06/2016   Procedure: RIGHT BREAST MASTECTOMY WITH RIGHT AXILLARY SENTINEL LYMPH NODE BIOPSY;  Surgeon: Alphonsa Overall, MD;  Location: [facility];  Service: General;  Laterality: Right;  . PORT-A-CATH REMOVAL Left 09/06/2016   Procedure: REMOVAL PORT-A-CATH;  Surgeon: Alphonsa Overall, MD;  Location: [facility];  Service: General;  Laterality: Left;  . PORTACATH PLACEMENT       FAMILY HISTORY Family History  Problem Relation Age of Onset  . Hypertension Mother   . Cancer Mother        dx "intestinal cancer" in her 60s; +surgery  . Other Mother        hysterectomy at young age for unspecified cause  . Heart Problems Mother   . Breast cancer Cousin        maternal 1st cousin dx female breast cancer at 68-46y  . Cancer Father   . Hypertension Father   . Heart Problems Maternal Aunt   . Diabetes Maternal Aunt   . Breast cancer Maternal Uncle        dx 64-65  . Heart Problems Maternal Uncle   . Breast cancer Maternal Grandmother 50  . Throat cancer Maternal Grandfather        d. 76s; smoker  . Sickle cell anemia Paternal Aunt   . Congestive Heart Failure Maternal Aunt   . Multiple sclerosis Cousin   . Cancer Other        maternal great uncle (MGM's brother);  cancer removed from his side  . Heart attack Paternal Aunt        d. early 26s    The patient has very little information about her father. Her mother is currently 37 years old. She had a history of cervical cancer at age 4. The patient had 2 brothers, no sisters. The maternal grandfather had throat cancer. A maternal uncle was diagnosed with breast cancer as well as prostate cancer at the age of 71. 2 maternal cousins, one of the mail, had breast cancer as well.  GYNECOLOGIC HISTORY:  No LMP recorded. Menarche age 66, first live birth age 39. The patient is GX P4. She was still having regular periods at the time of diagnosis. She took oral contraceptives in the 1990s  with no side effects.--.  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 Her mother used to manage a group home but is now retired. The patient's significant other [name] works at [employer].  At home with the patient are her 3 children [name], [place] and [place]. There are age 53, 49, and 95 as of November 2019.  2 of them are disabled or have significant health problems, one with sickle cell disease, the other with autism and developmental delay.  The patient's son [name], currently 44 years old, lives in [place].    ADVANCED DIRECTIVES: Not in place   HEALTH MAINTENANCE: Social History   Socioeconomic History  . Marital status: Single    Spouse name: Not on file  . Number of children: 4  . Years of education: Not on file  . Highest education level: Not on file  Occupational History  . Not on file  Social Needs  . Financial resource strain: Not on file  . Food insecurity    Worry: Not on file    Inability: Not on file  . Transportation needs    Medical: Not on file    Non-medical: Not on file  Tobacco Use  . Smoking status: Current Every Day Smoker    Packs/day: 0.25     Years: 20.00    Pack years: 5.00    Types: Cigarettes  . Smokeless tobacco: Never Used  . Tobacco comment: 1-2 ciagarettes daily.   Substance and Sexual Activity  . Alcohol use: Yes    Comment: occ  . Drug use: No  . Sexual activity: Not on file  Lifestyle  . Physical activity    Days per week: Not on file    Minutes per session: Not on file  . Stress: Not on file  Relationships  . Social Herbalist on phone: Not on file    Gets together: Not on file    Attends religious service: Not on file    Active member of club or organization: Not on file    Attends meetings of clubs or organizations: Not on file    Relationship status: Not on file  Other Topics Concern  . Not on file  Social History Narrative  . Not on file     Colonoscopy:  PAP:  Bone density:  Lipid panel:  No Known Allergies  Current Outpatient Medications on File Prior to Visit  Medication Sig Dispense Refill  . gabapentin (NEURONTIN) 300 MG capsule Take 1 tablet at bedtime for one week, then increase to 1 tablet morning and bedtime. 60 capsule 3  . omeprazole (PRILOSEC) 20 MG capsule Take 1 capsule (20 mg total) by mouth daily. 30 capsule 1   No current facility-administered medications on file prior to visit.     OBJECTIVE:  Vitals:   06/04/19 1046  BP: (!) 141/85  Pulse: (!) 104  Resp: 18  Temp: 98.9 F (37.2 C)  SpO2: 98%   Body mass index is 41.1 kg/m.   ECOG FS: 1 - Symptomatic but completely ambulatory GENERAL: Patient is a well appearing female in no acute distress HEENT:  Sclerae anicteric.  Oropharynx clear and moist. No ulcerations or evidence of oropharyngeal candidiasis. Neck is supple.  NODES:  No cervical, supraclavicular, or axillary lymphadenopathy palpated.  BREAST EXAM:right breast s/p mastectomy, mild nodularity at  mastectomy site, mild swelling above mastectomy site +TTP, left breast benign LUNGS:  Clear to auscultation bilaterally.  No wheezes or rhonchi. HEART:   Regular rate and rhythm. No murmur appreciated. ABDOMEN:  Soft, nontender.  Positive, normoactive bowel sounds. No organomegaly palpated. MSK:  No focal spinal tenderness to palpation. Full range of motion bilaterally in the upper extremities. EXTREMITIES:  No peripheral edema.   SKIN:  Clear with no obvious rashes or skin changes. No nail dyscrasia. NEURO:  Nonfocal. Well oriented.  Appropriate affect.    LAB RESULTS: CMP Latest Ref Rng & Units 01/23/2019 10/11/2018 04/10/2018  Glucose 70 - 99 mg/dL 85 106(H) 143(H)  BUN 6 - 20 mg/dL 12 11 9   Creatinine 0.44 - 1.00 mg/dL 1.14(H) 0.85 0.95  Sodium 135 - 145 mmol/L 141 143 140  Potassium 3.5 - 5.1 mmol/L 3.9 3.8 3.7  Chloride 98 - 111 mmol/L 108 108 106  CO2 22 - 32 mmol/L 27 27 28   Calcium 8.9 - 10.3 mg/dL 9.2 9.8 9.6  Total Protein 6.5 - 8.1 g/dL 7.8 7.8 7.7  Total Bilirubin 0.3 - 1.2 mg/dL 0.3 0.3 0.2  Alkaline Phos 38 - 126 U/L 82 83 93  AST 15 - 41 U/L 13(L) 16 17  ALT 0 - 44 U/L 12 14 16      CBC    Component Value Date/Time   WBC 11.1 (H) 01/23/2019 0906   RBC 4.04 01/23/2019 0906   HGB 11.5 (L) 01/23/2019 0906   HGB 12.2 04/10/2018 0908   HGB 10.4 (L) 10/04/2016 1154   HCT 34.9 (L) 01/23/2019 0906   HCT 31.3 (L) 10/04/2016 1154   PLT 222 01/23/2019 0906   PLT 221 04/10/2018 0908   PLT 320 10/04/2016 1154   MCV 86.4 01/23/2019 0906   MCV 95.7 10/04/2016 1154   MCH 28.5 01/23/2019 0906   MCHC 33.0 01/23/2019 0906   RDW 14.4 01/23/2019 0906   RDW 16.3 (H) 10/04/2016 1154   LYMPHSABS 2.3 01/23/2019 0906   LYMPHSABS 1.7 10/04/2016 1154   MONOABS 0.7 01/23/2019 0906   MONOABS 0.5 10/04/2016 1154   EOSABS 0.2 01/23/2019 0906   EOSABS 0.3 10/04/2016 1154   BASOSABS 0.0 01/23/2019 0906   BASOSABS 0.0 10/04/2016 1154    STUDIES:   RESEARCH: Referred to PREVENT study, but was pregnant at the time; referred to [facility] 11202, but the biopsied lymph node was benign; referred to weight loss study but declined;  referred to [facility] 12/09/2000, but the timing of radiation and 8 was greater than 60 days past the date of diagnosis; referred to health disparity study, but declined; referred to [id number] adjuvant therapy study, but the patient received Xeloda with radiation and therefore was ineligible  ASSESSMENT: 43 y.o. [place] woman status post right breast upper-outer quadrant biopsy 01/28/2016 for a clinical T2 N0 invasive ductal carcinoma, grade 3, triple negative, with an MIB-1 of 70%.  (a) suspicious right axillary lymph node biopsied 01/28/2016 was benign  (1) neoadjuvant chemotherapy: doxorubicin and cyclophosphamide in dose dense fashion 4 started 04/14/16, completed 05/26/2016, followed by paclitaxel and carboplatin weekly 12, Started 06/09/2016  (a) taxol discontinued after 7 doses because of neuropathy, last dose 07/21/2016  (2) genetics testing October 20, 2016 through the 32-gene Comprehensive Cancer Panel offered by [facility] Junius Roads, MD) (with MSH2 Exons 1-7 Inversion Analysis) found no deleterious mutations or VUSS  In APC, ATM, AXIN2, BARD1, BMPR1A, BRCA1, BRCA2, BRIP1, CDH1, CDK4, CDKN2A, CHEK2, EPCAM, FANCC, MLH1, MSH2, MSH6, MUTYH, NBN, PALB2,  PMS2, POLD1, POLE, PTEN, RAD51C, RAD51D, SCG5/GREM1, SMAD4, STK11, TP53, VHL, and XRCC2.    (3) right mastectomy and sentinel lymph node sampling 09/06/2016 showed a residual pT1c pN0, invasive ductal carcinoma, grade 3, with negative margins. Repeat prognostic panel again triple negative   (4) adjuvant radiation with capecitabine/Xeloda sensitization 11/15/16 - 01/12/17 Site/dose:   Right Chest Wall and axilla (4 field) treated to 45 Gy in 25 fractions, and then Boosted an additional 14.4 Gy in 8 fractions.  (5) tobacco abuse disorder: The patient quit smoking 04/04/2018  PLAN: Jacquelyn is having increased chest wall pain, and I am concerned that she has possibly recurred.  I have ordered CT chest to fully evaluate, and we will  get labs today.    Right chest wall pain: Tramadol #30 prescribed. PMP aware reviewed.  Reviewed necessity of prescription with Dr. [name] who is in agreement with above plan.  I encouraged her to continue with healthy diet and exercise.    Jolie will return in one week for follow up and to discuss CT results. She knows to call for any problems that may develop before then.  A total of (30) minutes of face-to-face time was spent with this patient with greater than 50% of that time in counseling and care-coordination.   Wilber Bihari, NP  06/04/19 11:12 AM Medical Oncology and Hematology [facility] [address] [place], [place] [address] Tel. [phone]    Fax. [phone]

## 2019-06-05 ENCOUNTER — Telehealth: Payer: Self-pay | Admitting: Adult Health

## 2019-06-05 NOTE — Telephone Encounter (Signed)
I talk with patient regarding schedule  

## 2019-06-06 ENCOUNTER — Encounter: Payer: Medicaid Other | Admitting: Neurology

## 2019-06-06 ENCOUNTER — Ambulatory Visit: Payer: Medicaid Other | Admitting: Neurology

## 2019-06-06 LAB — CANCER ANTIGEN 27.29: CA 27.29: 118.3 U/mL — ABNORMAL HIGH (ref 0.0–38.6)

## 2019-06-11 ENCOUNTER — Other Ambulatory Visit: Payer: Self-pay

## 2019-06-11 ENCOUNTER — Ambulatory Visit (HOSPITAL_COMMUNITY)
Admission: RE | Admit: 2019-06-11 | Discharge: 2019-06-11 | Disposition: A | Discharge status: Home / Self Care | Payer: Medicaid Other | Source: Ambulatory Visit | Attending: Adult Health | Admitting: Adult Health

## 2019-06-11 DIAGNOSIS — C50411 Malignant neoplasm of upper-outer quadrant of right female breast: Secondary | ICD-10-CM | POA: Insufficient documentation

## 2019-06-11 DIAGNOSIS — R918 Other nonspecific abnormal finding of lung field: Secondary | ICD-10-CM | POA: Diagnosis not present

## 2019-06-11 DIAGNOSIS — Z171 Estrogen receptor negative status [ER-]: Secondary | ICD-10-CM | POA: Diagnosis not present

## 2019-06-11 MED ORDER — IOHEXOL 300 MG/ML  SOLN
75.0000 mL | Freq: Once | INTRAMUSCULAR | Status: AC | PRN
  Administered 2019-06-11: 75 mL via INTRAVENOUS

## 2019-06-11 MED ORDER — SODIUM CHLORIDE (PF) 0.9 % IJ SOLN
INTRAMUSCULAR | Status: AC
  Filled 2019-06-11: qty 50

## 2019-06-12 ENCOUNTER — Inpatient Hospital Stay: Payer: Medicaid Other | Attending: Oncology | Admitting: Adult Health

## 2019-06-12 ENCOUNTER — Other Ambulatory Visit: Payer: Self-pay

## 2019-06-12 ENCOUNTER — Encounter: Payer: Self-pay | Admitting: Adult Health

## 2019-06-12 VITALS — BP 104/76 | HR 86 | Temp 98.0°F | Resp 18 | Ht 61.5 in | Wt 220.3 lb

## 2019-06-12 DIAGNOSIS — Z87891 Personal history of nicotine dependence: Secondary | ICD-10-CM | POA: Diagnosis not present

## 2019-06-12 DIAGNOSIS — Z853 Personal history of malignant neoplasm of breast: Secondary | ICD-10-CM

## 2019-06-12 DIAGNOSIS — D573 Sickle-cell trait: Secondary | ICD-10-CM | POA: Insufficient documentation

## 2019-06-12 DIAGNOSIS — C50411 Malignant neoplasm of upper-outer quadrant of right female breast: Secondary | ICD-10-CM | POA: Insufficient documentation

## 2019-06-12 DIAGNOSIS — I10 Essential (primary) hypertension: Secondary | ICD-10-CM | POA: Insufficient documentation

## 2019-06-12 DIAGNOSIS — Z803 Family history of malignant neoplasm of breast: Secondary | ICD-10-CM | POA: Diagnosis not present

## 2019-06-12 DIAGNOSIS — K219 Gastro-esophageal reflux disease without esophagitis: Secondary | ICD-10-CM

## 2019-06-12 DIAGNOSIS — E669 Obesity, unspecified: Secondary | ICD-10-CM | POA: Diagnosis not present

## 2019-06-12 DIAGNOSIS — Z9011 Acquired absence of right breast and nipple: Secondary | ICD-10-CM | POA: Diagnosis not present

## 2019-06-12 DIAGNOSIS — C773 Secondary and unspecified malignant neoplasm of axilla and upper limb lymph nodes: Secondary | ICD-10-CM | POA: Insufficient documentation

## 2019-06-12 DIAGNOSIS — Z171 Estrogen receptor negative status [ER-]: Secondary | ICD-10-CM

## 2019-06-12 DIAGNOSIS — Z79899 Other long term (current) drug therapy: Secondary | ICD-10-CM

## 2019-06-12 DIAGNOSIS — Z170 Estrogen receptor positive status [ER+]: Secondary | ICD-10-CM | POA: Diagnosis not present

## 2019-06-12 DIAGNOSIS — F329 Major depressive disorder, single episode, unspecified: Secondary | ICD-10-CM | POA: Insufficient documentation

## 2019-06-12 MED ORDER — TRAMADOL HCL 50 MG PO TABS: 50.0000 mg | ORAL_TABLET | Freq: Four times a day (QID) | ORAL | 0 refills | Status: DC | PRN

## 2019-06-12 NOTE — Progress Notes (Signed)
[facility]  [phone] [phone]   ID: Rebecca [name] DOB: December 22, 1975  MR#: [id number]  QIH#:[id number]  Patient Care Team: Dorena Dew, FNP as PCP - General (Family Medicine) Alphonsa Overall, MD as Consulting Physician (General Surgery) Chauncey Cruel, MD as Consulting Physician (Oncology) Gery Pray, MD as Consulting Physician (Radiation Oncology) Sylvan Cheese, NP as Nurse Practitioner (Hematology and Oncology) Benson Norway, RN as Registered Nurse OTHER MD:  CHIEF COMPLAINT: triple negative breast cancer  CURRENT TREATMENT: Observation  BREAST CANCER HISTORY: From the original intake note:  Rebecca [name] herself noted a change in her right breast sometime around September or October 2016. She did not bring it to intermediate medical attention, but on 01/13/2016 she established herself in Dr. Smith Robert' service and she was set up for bilateral diagnostic mammography with tomosynthesis and bilateral ultrasonography at the [facility] 01/19/2016. The breast density was category B. In the upper outer quadrant of the right breast there was a spiculated mass measuring 2.8 cm. On physical exam this was palpable. Targeted ultrasonography confirmed an irregular hypoechoic mass in the right breast 11:30 o'clock position measuring 2.6 cm maximally. Ultrasound of the right axilla showed a morphologically abnormal lymph node.  In the left breast there were some tubular densities behind the areola which by ultrasonography showed benign ductal ectasia.  On 01/28/2016 Rebecca [name] underwent biopsy of the right breast mass and abnormal right axillary lymph node. The pathology from this procedure (S AAA [id number]) showed the lymph node to be benign. In the breast however there was an invasive ductal carcinoma, grade 3, which was estrogen and progesterone receptor negative. The proliferation marker was 70%. HER-2 was not amplified with a signals ratio of 1.32. The number  per cell was 2.05.  The patient's subsequent history is as detailed below  INTERVAL HISTORY: Rebecca [name] returns today for follow-up of her triple negative breast cancer. She is under observation alone.   Rebecca [name] underwent CT chest yesterday, 06/11/2019 that showed q rounded mass like appearance of the lateral aspect of the pectoralis muscle and mild nodularity in the right axilla and some nodularity along the anterior margin of the lower pectoralis muscle.  A PET CT was recommended to evaluate for hypermetabolism and assist with guiding potential biopsy.    REVIEW OF SYSTEMS: Rebecca [name] is doing well.  She was started on Tramadol at her last visit with me for the pain at her right chest wall and axilla.  She notes the pain is under better control and she last took medication over the weekend.  She typically took one tramadol per day.  PMP aware reviewed, no other prescribers, last fill on 06/04/2019.   PAST MEDICAL HISTORY: Past Medical History:  Diagnosis Date  . Anxiety   . Breast cancer ([place])   . Depression   . GERD (gastroesophageal reflux disease)   . History of radiation therapy 11/15/16-01/12/17   right chest wall and axilla treated to 45 Gy in 25 fractions, boosted and additional 14 Gy in 8 fractions  . Hypertension    states losartan and coreg are for her heart  . Obesity (BMI 35.0-39.9 without comorbidity)   . Seasonal allergies   . Sickle cell trait ([place])   . Termination of pregnancy (fetus) 04/02/16    PAST SURGICAL HISTORY: Past Surgical History:  Procedure Laterality Date  . CESAREAN SECTION     2004 and 2007  . MASTECTOMY W/ SENTINEL NODE BIOPSY Right 09/06/2016   Procedure: RIGHT BREAST MASTECTOMY WITH RIGHT  AXILLARY SENTINEL LYMPH NODE BIOPSY;  Surgeon: Alphonsa Overall, MD;  Location: [facility];  Service: General;  Laterality: Right;  . PORT-A-CATH REMOVAL Left 09/06/2016   Procedure: REMOVAL PORT-A-CATH;  Surgeon: Alphonsa Overall, MD;  Location: [facility];  Service: General;  Laterality: Left;  . PORTACATH PLACEMENT       FAMILY HISTORY Family History  Problem Relation Age of Onset  . Hypertension Mother   . Cancer Mother        dx "intestinal cancer" in her 77s; +surgery  . Other Mother        hysterectomy at young age for unspecified cause  . Heart Problems Mother   . Breast cancer Cousin        maternal 1st cousin dx female breast cancer at 38-46y  . Cancer Father   . Hypertension Father   . Heart Problems Maternal Aunt   . Diabetes Maternal Aunt   . Breast cancer Maternal Uncle        dx 64-65  . Heart Problems Maternal Uncle   . Breast cancer Maternal Grandmother 16  . Throat cancer Maternal Grandfather        d. 25s; smoker  . Sickle cell anemia Paternal Aunt   . Congestive Heart Failure Maternal Aunt   . Multiple sclerosis Cousin   . Cancer Other        maternal great uncle (MGM's brother); cancer removed from his side  . Heart attack Paternal Aunt        d. early 100s    The patient has very little information about her father. Her mother is currently 66 years old. She had a history of cervical cancer at age 54. The patient had 2 brothers, no sisters. The maternal grandfather had throat cancer. A maternal uncle was diagnosed with breast cancer as well as prostate cancer at the age of 47. 2 maternal cousins, one of the mail, had breast cancer as well.  GYNECOLOGIC HISTORY:  No LMP recorded. Menarche age 89, first live birth age 24. The patient is GX P4. She was still having regular periods at the time of diagnosis. She took oral contraceptives in the 1990s with no side effects.--.  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 Her mother used to manage a group home but is now retired. The patient's significant other Rebecca [name] works at [employer].  At home with the patient are her 3 children Rebecca [name],  Rebecca [name] and Rebecca [name]. There are age 40, 83, and 65 as of November 2019.  2 of them are disabled or have significant health problems, one with sickle cell disease, the other with autism and developmental delay.  The patient's son Rebecca [name], currently 77 years old, lives in [place].    ADVANCED DIRECTIVES: Not in place   HEALTH MAINTENANCE: Social History   Socioeconomic History  . Marital status: Single    Spouse name: Not on file  . Number of children: 4  . Years of education: Not on file  . Highest education level: Not on file  Occupational History  . Occupation: Private Care Attendant    Comment: [employer] choice [employer]  . Financial resource strain: Not on file  . Food insecurity    Worry: Never true    Inability: Never true  . Transportation needs  Medical: No    Non-medical: No  Tobacco Use  . Smoking status: Former Smoker    Packs/day: 1.00    Years: 20.00    Pack years: 20.00    Types: Cigarettes    Quit date: 04/01/2018    Years since quitting: 1.1  . Smokeless tobacco: Never Used  . Tobacco comment: Patient has quit smoking x 1 year now  Substance and Sexual Activity  . Alcohol use: Yes    Comment: occ  . Drug use: No  . Sexual activity: Not on file  Lifestyle  . Physical activity    Days per week: 7 days    Minutes per session: 60 min  . Stress: Not at all  Relationships  . Social Herbalist on phone: Not on file    Gets together: Not on file    Attends religious service: Not on file    Active member of club or organization: Not on file    Attends meetings of clubs or organizations: Not on file    Relationship status: Not on file  Other Topics Concern  . Not on file  Social History Narrative  . Not on file     Colonoscopy:  PAP:  Bone density:  Lipid panel:  No Known Allergies  Current Outpatient Medications on File Prior to Visit  Medication Sig Dispense Refill  . gabapentin (NEURONTIN) 300 MG  capsule Take 1 tablet at bedtime for one week, then increase to 1 tablet morning and bedtime. 60 capsule 3  . omeprazole (PRILOSEC) 20 MG capsule Take 1 capsule (20 mg total) by mouth daily. 30 capsule 1   No current facility-administered medications on file prior to visit.     OBJECTIVE:  Vitals:   06/12/19 1531  BP: 104/76  Pulse: 86  Resp: 18  Temp: 98 F (36.7 C)  SpO2: 98%   Body mass index is 40.95 kg/m.   ECOG FS: 1 - Symptomatic but completely ambulatory GENERAL: Patient is a well appearing female in no acute distress HEENT:  Sclerae anicteric.  Oropharynx clear and moist. No ulcerations or evidence of oropharyngeal candidiasis. Neck is supple.  NODES:  No cervical, supraclavicular, or axillary lymphadenopathy palpated.  BREAST EXAM:right breast s/p mastectomy, mild nodularity at mastectomy site, mild swelling above mastectomy site +TTP, left breast benign LUNGS:  Clear to auscultation bilaterally.  No wheezes or rhonchi. HEART:  Regular rate and rhythm. No murmur appreciated. ABDOMEN:  Soft, nontender.  Positive, normoactive bowel sounds. No organomegaly palpated. MSK:  No focal spinal tenderness to palpation. Full range of motion bilaterally in the upper extremities. EXTREMITIES:  No peripheral edema.   SKIN:  Clear with no obvious rashes or skin changes. No nail dyscrasia. NEURO:  Nonfocal. Well oriented.  Appropriate affect.    LAB RESULTS: CMP Latest Ref Rng & Units 06/04/2019 01/23/2019 10/11/2018  Glucose 70 - 99 mg/dL 96 85 106(H)  BUN 6 - 20 mg/dL 8 12 11   Creatinine 0.44 - 1.00 mg/dL 0.82 1.14(H) 0.85  Sodium 135 - 145 mmol/L 143 141 143  Potassium 3.5 - 5.1 mmol/L 3.7 3.9 3.8  Chloride 98 - 111 mmol/L 108 108 108  CO2 22 - 32 mmol/L 27 27 27   Calcium 8.9 - 10.3 mg/dL 9.2 9.2 9.8  Total Protein 6.5 - 8.1 g/dL 7.9 7.8 7.8  Total Bilirubin 0.3 - 1.2 mg/dL 0.2(L) 0.3 0.3  Alkaline Phos 38 - 126 U/L 78 82 83  AST 15 - 41 U/L 15  13(L) 16  ALT 0 - 44 U/L 11 12  14      CBC    Component Value Date/Time   WBC 12.3 (H) 06/04/2019 1218   WBC 11.1 (H) 01/23/2019 0906   RBC 3.98 06/04/2019 1218   HGB 11.1 (L) 06/04/2019 1218   HGB 10.4 (L) 10/04/2016 1154   HCT 33.7 (L) 06/04/2019 1218   HCT 31.3 (L) 10/04/2016 1154   PLT 243 06/04/2019 1218   PLT 320 10/04/2016 1154   MCV 84.7 06/04/2019 1218   MCV 95.7 10/04/2016 1154   MCH 27.9 06/04/2019 1218   MCHC 32.9 06/04/2019 1218   RDW 14.5 06/04/2019 1218   RDW 16.3 (H) 10/04/2016 1154   LYMPHSABS 2.5 06/04/2019 1218   LYMPHSABS 1.7 10/04/2016 1154   MONOABS 0.9 06/04/2019 1218   MONOABS 0.5 10/04/2016 1154   EOSABS 0.2 06/04/2019 1218   EOSABS 0.3 10/04/2016 1154   BASOSABS 0.0 06/04/2019 1218   BASOSABS 0.0 10/04/2016 1154    STUDIES: CLINICAL DATA:  Remote history of breast cancer with initial diagnosis 2016. Patient is having pain and swelling in the right upper chest.  EXAM: CT CHEST WITH CONTRAST  TECHNIQUE: Multidetector CT imaging of the chest was performed during intravenous contrast administration.  CONTRAST:  53m OMNIPAQUE IOHEXOL 300 MG/ML  SOLN  COMPARISON:  Chest CT 05/19/2012  FINDINGS: Cardiovascular: The heart is normal in size. No pericardial effusion. The aorta is normal in caliber. No dissection. No atherosclerotic calcifications. A few scattered coronary artery calcifications are suspected.  Mediastinum/Nodes: No mediastinal or hilar mass or adenopathy. There is some residual thymic tissue noted in the anterior mediastinum but I do not see any significant change since 2013.  The esophagus is grossly normal.  The thyroid gland is unremarkable.  Lungs/Pleura: No worrisome pulmonary lesions or pulmonary nodules to suggest metastatic disease. There are radiation changes involving the anterior aspect of the right lung. No pleural effusions. No pleural nodules.  Upper Abdomen: No significant upper abdominal findings. I do not see any findings  suspicious for upper abdominal metastatic disease.  Musculoskeletal: There are surgical changes from a right mastectomy. There is also skin thickening and probable radiation changes. The lateral margin of the pectoralis major muscle is abnormally thickened and has almost a rounded configuration measuring up to 3 cm in diameter. This is very asymmetric when compared to the other muscle. However, it could be related to surgery and radiation. Slightly more laterally in the axilla there is an area of nodularity with 2 small adjacent nodules or possibly lymph nodes measuring 8 mm each. The fat plane between the pectoralis major and minor muscle is also indistinct although I do not see a definite lesion in between them. No lower axillary lymph nodes. Along the inferior margin of the mastectomy scar along the lower margin of the pectoralis the anterior surface is very irregular and nodular in appearance. Again this could be surgical and radiation changes.  No bone lesions are identified to suggest osseous metastatic disease and I do not see any anterior rib destruction.  IMPRESSION: 1. Surgical changes from a right mastectomy. Without a prior postmastectomy study for comparison it is difficult to know whether all these changes are postsurgical/post radiation or whether there could be recurrent tumor. I am most worried about the rounded masslike appearance of the lateral aspect of the pectoralis muscle. There is also mild nodularity in the right axilla and some nodularity along the anterior margin of the lower pectoralis muscle. Recommend correlation  with physical examination. A PET-CT may be helpful to exclude any hypermetabolic recurrent tumor and to direct a potential biopsy. 2. No supraclavicular adenopathy and no findings suspicious for mediastinal or hilar disease or pulmonary metastasis. 3. No findings suspicious for osseous metastatic disease.   Electronically Signed   By:  Marijo Sanes M.D.   On: 06/11/2019 20:45  RESEARCH: Referred to PREVENT study, but was pregnant at the time; referred to [facility] 11202, but the biopsied lymph node was benign; referred to weight loss study but declined; referred to [facility] 12/09/2000, but the timing of radiation and 8 was greater than 60 days past the date of diagnosis; referred to health disparity study, but declined; referred to [id number] adjuvant therapy study, but the patient received Xeloda with radiation and therefore was ineligible  ASSESSMENT: 43 y.o. [place] woman status post right breast upper-outer quadrant biopsy 01/28/2016 for a clinical T2 N0 invasive ductal carcinoma, grade 3, triple negative, with an MIB-1 of 70%.  (a) suspicious right axillary lymph node biopsied 01/28/2016 was benign  (1) neoadjuvant chemotherapy: doxorubicin and cyclophosphamide in dose dense fashion 4 started 04/14/16, completed 05/26/2016, followed by paclitaxel and carboplatin weekly 12, Started 06/09/2016  (a) taxol discontinued after 7 doses because of neuropathy, last dose 07/21/2016  (2) genetics testing October 20, 2016 through the 32-gene Comprehensive Cancer Panel offered by [facility] Junius Roads, MD) (with MSH2 Exons 1-7 Inversion Analysis) found no deleterious mutations or VUSS  In APC, ATM, AXIN2, BARD1, BMPR1A, BRCA1, BRCA2, BRIP1, CDH1, CDK4, CDKN2A, CHEK2, EPCAM, FANCC, MLH1, MSH2, MSH6, MUTYH, NBN, PALB2, PMS2, POLD1, POLE, PTEN, RAD51C, RAD51D, SCG5/GREM1, SMAD4, STK11, TP53, VHL, and XRCC2.    (3) right mastectomy and sentinel lymph node sampling 09/06/2016 showed a residual pT1c pN0, invasive ductal carcinoma, grade 3, with negative margins. Repeat prognostic panel again triple negative   (4) adjuvant radiation with capecitabine/Xeloda sensitization 11/15/16 - 01/12/17 Site/dose:   Right Chest Wall and axilla (4 field) treated to 45 Gy in 25 fractions, and then Boosted an additional 14.4 Gy in 8  fractions.  (5) tobacco abuse disorder: The patient quit smoking 04/04/2018  PLAN: Shania is doing moderately well.  I reviewed her CT chest results with her which are concerning for recurrence but inconclusive.  She needs PET scan which was ordered.  Once we get this done I will call her with the results and we will order a biopsy if indicated.    Her pain is controlled with tramadol. I refilled this today.  I counseled her about using Tramadol and goals of pain control.  Her use is appropriate.  PMP aware reviewed.  I reviewed the above with Dr. [name] in detail who is in agreement with the above plan.    A total of (20) minutes of face-to-face time was spent with this patient with greater than 50% of that time in counseling and care-coordination.   Wilber Bihari, NP  06/12/19 3:49 PM Medical Oncology and Hematology [facility] [address] [place], [place] [address] Tel. [phone]    Fax. [phone]

## 2019-06-19 ENCOUNTER — Other Ambulatory Visit: Payer: Self-pay

## 2019-06-19 ENCOUNTER — Ambulatory Visit (HOSPITAL_COMMUNITY)
Admission: RE | Admit: 2019-06-19 | Discharge: 2019-06-19 | Disposition: A | Discharge status: Home / Self Care | Payer: Medicaid Other | Source: Ambulatory Visit | Attending: Adult Health | Admitting: Adult Health

## 2019-06-19 DIAGNOSIS — C50911 Malignant neoplasm of unspecified site of right female breast: Secondary | ICD-10-CM | POA: Diagnosis not present

## 2019-06-19 DIAGNOSIS — C50411 Malignant neoplasm of upper-outer quadrant of right female breast: Secondary | ICD-10-CM | POA: Diagnosis not present

## 2019-06-19 DIAGNOSIS — Z171 Estrogen receptor negative status [ER-]: Secondary | ICD-10-CM | POA: Diagnosis not present

## 2019-06-19 LAB — GLUCOSE, CAPILLARY: Glucose-Capillary: 90 mg/dL (ref 70–99)

## 2019-06-19 MED ORDER — FLUDEOXYGLUCOSE F - 18 (FDG) INJECTION
10.0000 | Freq: Once | INTRAVENOUS | Status: AC | PRN
  Administered 2019-06-19: 10 via INTRAVENOUS

## 2019-06-20 ENCOUNTER — Telehealth: Payer: Self-pay | Admitting: Oncology

## 2019-06-20 ENCOUNTER — Other Ambulatory Visit: Payer: Self-pay | Admitting: Adult Health

## 2019-06-20 DIAGNOSIS — C50411 Malignant neoplasm of upper-outer quadrant of right female breast: Secondary | ICD-10-CM

## 2019-06-20 DIAGNOSIS — Z171 Estrogen receptor negative status [ER-]: Secondary | ICD-10-CM

## 2019-06-20 NOTE — Telephone Encounter (Signed)
Per Mendel Ryder will call patient

## 2019-06-20 NOTE — Progress Notes (Signed)
Reviewed with patient that the area on CT scan and are of pain and swelling is consistent with recurrence.  Informed her that we needed to do biopsy and I am placing orders for that now. I let her know that we will get information about what is going on in the chest wall, whether or not it is breast cancer recurrence and if so, if it is the same breast cancer.  She understands this.  I let her know that she will see Dr. [name] on 7/28 to review these results.  Orders placed for IR biopsy.  Cecille Rubin [place] aware to f/u with patient about this.     Wilber Bihari, NP

## 2019-06-21 ENCOUNTER — Other Ambulatory Visit: Payer: Self-pay | Admitting: Adult Health

## 2019-06-21 ENCOUNTER — Other Ambulatory Visit: Payer: Self-pay | Admitting: *Deleted

## 2019-06-21 DIAGNOSIS — C50411 Malignant neoplasm of upper-outer quadrant of right female breast: Secondary | ICD-10-CM

## 2019-06-21 DIAGNOSIS — Z171 Estrogen receptor negative status [ER-]: Secondary | ICD-10-CM

## 2019-06-21 DIAGNOSIS — R948 Abnormal results of function studies of other organs and systems: Secondary | ICD-10-CM

## 2019-06-21 NOTE — Progress Notes (Signed)
"  Gus and Mendel Ryder,   This is really a complicated case that is certainly recurrent breast CA that might be better assessed by our mammography docs at the [facility]. There is tumor in the right chest wall but also in a contralateral left axillary node. I think she would be better served with breast US characterization at [facility] and biopsy of right chest wall and left axilla. If there is delay in getting her in, let me know. At the hospital, we would probably just stick to a left axillary LN biopsy.   Thanks,   Eulas Post"  Orders placed per Dr. Kathlene Cote recommendations.  Norris Cross and breast navigators notified to expedite patient appointment.    Wilber Bihari, NP

## 2019-06-26 ENCOUNTER — Other Ambulatory Visit: Payer: Medicaid Other

## 2019-06-27 ENCOUNTER — Ambulatory Visit
Admission: RE | Admit: 2019-06-27 | Discharge: 2019-06-27 | Disposition: A | Discharge status: Home / Self Care | Payer: Medicaid Other | Source: Ambulatory Visit | Attending: Adult Health | Admitting: Adult Health

## 2019-06-27 ENCOUNTER — Other Ambulatory Visit: Payer: Self-pay | Admitting: Adult Health

## 2019-06-27 ENCOUNTER — Ambulatory Visit: Payer: Medicaid Other

## 2019-06-27 ENCOUNTER — Ambulatory Visit: Admission: RE | Admit: 2019-06-27 | Payer: Medicaid Other | Source: Ambulatory Visit

## 2019-06-27 ENCOUNTER — Other Ambulatory Visit: Payer: Self-pay

## 2019-06-27 DIAGNOSIS — C50411 Malignant neoplasm of upper-outer quadrant of right female breast: Secondary | ICD-10-CM

## 2019-06-27 DIAGNOSIS — N6311 Unspecified lump in the right breast, upper outer quadrant: Secondary | ICD-10-CM | POA: Diagnosis not present

## 2019-06-27 DIAGNOSIS — Z853 Personal history of malignant neoplasm of breast: Secondary | ICD-10-CM | POA: Diagnosis not present

## 2019-06-27 DIAGNOSIS — Z171 Estrogen receptor negative status [ER-]: Secondary | ICD-10-CM

## 2019-06-27 DIAGNOSIS — R928 Other abnormal and inconclusive findings on diagnostic imaging of breast: Secondary | ICD-10-CM | POA: Diagnosis not present

## 2019-06-27 DIAGNOSIS — N6459 Other signs and symptoms in breast: Secondary | ICD-10-CM | POA: Diagnosis not present

## 2019-06-28 ENCOUNTER — Ambulatory Visit
Admission: RE | Admit: 2019-06-28 | Discharge: 2019-06-28 | Disposition: A | Discharge status: Home / Self Care | Payer: Medicaid Other | Source: Ambulatory Visit | Attending: Adult Health | Admitting: Adult Health

## 2019-06-28 ENCOUNTER — Other Ambulatory Visit: Payer: Medicaid Other

## 2019-06-28 DIAGNOSIS — R599 Enlarged lymph nodes, unspecified: Secondary | ICD-10-CM | POA: Diagnosis not present

## 2019-06-28 DIAGNOSIS — N6311 Unspecified lump in the right breast, upper outer quadrant: Secondary | ICD-10-CM | POA: Diagnosis not present

## 2019-06-28 DIAGNOSIS — Z171 Estrogen receptor negative status [ER-]: Secondary | ICD-10-CM

## 2019-06-28 DIAGNOSIS — C50411 Malignant neoplasm of upper-outer quadrant of right female breast: Secondary | ICD-10-CM

## 2019-07-02 ENCOUNTER — Other Ambulatory Visit: Payer: Self-pay

## 2019-07-02 ENCOUNTER — Inpatient Hospital Stay (HOSPITAL_BASED_OUTPATIENT_CLINIC_OR_DEPARTMENT_OTHER): Payer: Medicaid Other | Admitting: Oncology

## 2019-07-02 VITALS — BP 129/87 | HR 102 | Temp 98.5°F | Resp 18 | Ht 61.5 in | Wt 213.6 lb

## 2019-07-02 DIAGNOSIS — E669 Obesity, unspecified: Secondary | ICD-10-CM | POA: Diagnosis not present

## 2019-07-02 DIAGNOSIS — C773 Secondary and unspecified malignant neoplasm of axilla and upper limb lymph nodes: Secondary | ICD-10-CM | POA: Diagnosis not present

## 2019-07-02 DIAGNOSIS — K219 Gastro-esophageal reflux disease without esophagitis: Secondary | ICD-10-CM | POA: Diagnosis not present

## 2019-07-02 DIAGNOSIS — Z803 Family history of malignant neoplasm of breast: Secondary | ICD-10-CM | POA: Diagnosis not present

## 2019-07-02 DIAGNOSIS — D573 Sickle-cell trait: Secondary | ICD-10-CM

## 2019-07-02 DIAGNOSIS — Z79899 Other long term (current) drug therapy: Secondary | ICD-10-CM | POA: Diagnosis not present

## 2019-07-02 DIAGNOSIS — C7951 Secondary malignant neoplasm of bone: Secondary | ICD-10-CM

## 2019-07-02 DIAGNOSIS — Z87891 Personal history of nicotine dependence: Secondary | ICD-10-CM

## 2019-07-02 DIAGNOSIS — I10 Essential (primary) hypertension: Secondary | ICD-10-CM

## 2019-07-02 DIAGNOSIS — Z170 Estrogen receptor positive status [ER+]: Secondary | ICD-10-CM | POA: Diagnosis not present

## 2019-07-02 DIAGNOSIS — Z9011 Acquired absence of right breast and nipple: Secondary | ICD-10-CM | POA: Diagnosis not present

## 2019-07-02 DIAGNOSIS — Z7189 Other specified counseling: Secondary | ICD-10-CM | POA: Insufficient documentation

## 2019-07-02 DIAGNOSIS — C50411 Malignant neoplasm of upper-outer quadrant of right female breast: Secondary | ICD-10-CM | POA: Diagnosis not present

## 2019-07-02 DIAGNOSIS — F329 Major depressive disorder, single episode, unspecified: Secondary | ICD-10-CM | POA: Diagnosis not present

## 2019-07-02 DIAGNOSIS — Z1379 Encounter for other screening for genetic and chromosomal anomalies: Secondary | ICD-10-CM

## 2019-07-02 DIAGNOSIS — C50919 Malignant neoplasm of unspecified site of unspecified female breast: Secondary | ICD-10-CM

## 2019-07-02 MED ORDER — LIDOCAINE-PRILOCAINE 2.5-2.5 % EX CREA: TOPICAL_CREAM | CUTANEOUS | 3 refills | Status: DC

## 2019-07-02 MED ORDER — PROCHLORPERAZINE MALEATE 10 MG PO TABS: 10.0000 mg | ORAL_TABLET | Freq: Four times a day (QID) | ORAL | 1 refills | Status: DC | PRN

## 2019-07-02 NOTE — Progress Notes (Signed)
[place]  [phone]) [phone] [phone]) [phone]   ID: [name] DOB: 02/17/1976  MR#: [id number]  XLK#:[id number]  Patient Care Team: Dorena Dew, FNP as PCP - General (Family Medicine) Alphonsa Overall, MD as Consulting Physician (General Surgery) Maicey Barrientez, Rebecca Dad, MD as Consulting Physician (Oncology) Gery Pray, MD as Consulting Physician (Radiation Oncology) Delice Bison, Charlestine Massed, NP as Nurse Practitioner (Hematology and Oncology) Alda Berthold, DO as Consulting Physician (Neurology) OTHER MD:  CHIEF COMPLAINT: triple negative stage IV breast cancer  CURRENT TREATMENT: carboplatin/ gemcitabine; zoledronate   INTERVAL HISTORY: Rebecca [name] returns today for follow-up of her triple negative breast cancer. She is under observation alone.   Since her last visit, she underwent PET scan on 06/19/2019. This revealed: multifocal hypermetabolic disease in the right anterior chest wall, dominant lesion is a 3.1 cm mass in the right pectoralis major muscle; hypermetabolic right internal mammary lymph nodes with hypermetabolic nodes in the right axilla; large hypermetabolic nodal metastasis in the left axilla; no definite hypermetabolic metastatic disease in the neck, abdomen, or pelvis; mottled diffuse marrow uptake is presumable related to marrow response to therapy although bony metastatic disease could have this appearance.  She proceeded to left diagnostic mammography with tomography and bilateral axilla ultrasound at [facility] on 06/27/2019 showing: breast density category B; suspicious right upper-outer quadrant mastectomy bed mass; suspicious bilateral axillary lymphadenopathy; no mammographic evidence of malignancy within the left breast.  Accordingly on 06/28/2019 she proceeded to biopsy of the suspicious areas in question. The pathology from this procedure ([id number]) showed: invasive ductal carcinoma in the right mastectomy bed mass, a right and a  left axillary lymph nodes.  This is unfortunately again triple negative, with an MIB-1 of 90%.   REVIEW OF SYSTEMS: Rebecca [name] reports she felt the pain to her right chest, which she states was similar to her first diagnosis, so she brought it to our attention. She notes she is trying to lose weight.  She denies unusual headaches, visual changes, nausea vomiting, balance problems or falls.  She has had no cough phlegm production pleurisy or shortness of breath.  She denies any change in bowel or bladder habits.  A detailed review of systems was otherwise entirely negative.  Wt Readings from Last 3 Encounters:  07/02/19 213 lb 9 oz (96.9 kg)  06/12/19 220 lb 4.8 oz (99.9 kg)  06/04/19 221 lb 1.6 oz (100.3 kg)    BREAST CANCER HISTORY: From the original intake note:  Rebecca [name] herself noted a change in her right breast sometime around September or October 2016. She did not bring it to intermediate medical attention, but on 01/13/2016 she established herself in Dr. Smith Robert' service and she was set up for bilateral diagnostic mammography with tomosynthesis and bilateral ultrasonography at the [facility] 01/19/2016. The breast density was category B. In the upper outer quadrant of the right breast there was a spiculated mass measuring 2.8 cm. On physical exam this was palpable. Targeted ultrasonography confirmed an irregular hypoechoic mass in the right breast 11:30 o'clock position measuring 2.6 cm maximally. Ultrasound of the right axilla showed a morphologically abnormal lymph node.  In the left breast there were some tubular densities behind the areola which by ultrasonography showed benign ductal ectasia.  On 01/28/2016 Rebecca [name] underwent biopsy of the right breast mass and abnormal right axillary lymph node. The pathology from this procedure (S [id number]) showed the lymph node to be benign. In the breast however there was an invasive ductal carcinoma, grade 3,  which was estrogen and progesterone receptor  negative. The proliferation marker was 70%. HER-2 was not amplified with a signals ratio of 1.32. The number per cell was 2.05.  The patient's subsequent history is as detailed below   PAST MEDICAL HISTORY: Past Medical History:  Diagnosis Date   Anxiety    Breast cancer ([place])    Depression    GERD (gastroesophageal reflux disease)    History of radiation therapy 11/15/16-01/12/17   right chest wall and axilla treated to 45 Gy in 25 fractions, boosted and additional 14 Gy in 8 fractions   Hypertension    states losartan and coreg are for her heart   Obesity (BMI 35.0-39.9 without comorbidity)    Seasonal allergies    Sickle cell trait ([place])    Termination of pregnancy (fetus) 04/02/16    PAST SURGICAL HISTORY: Past Surgical History:  Procedure Laterality Date   CESAREAN SECTION     2004 and 2007   MASTECTOMY W/ SENTINEL NODE BIOPSY Right 09/06/2016   Procedure: RIGHT BREAST MASTECTOMY WITH RIGHT AXILLARY SENTINEL LYMPH NODE BIOPSY;  Surgeon: Alphonsa Overall, MD;  Location: [facility];  Service: General;  Laterality: Right;   PORT-A-CATH REMOVAL Left 09/06/2016   Procedure: REMOVAL PORT-A-CATH;  Surgeon: Alphonsa Overall, MD;  Location: [place];  Service: General;  Laterality: Left;   PORTACATH PLACEMENT      FAMILY HISTORY Family History  Problem Relation Age of Onset   Hypertension Mother    Cancer Mother        dx "intestinal cancer" in her 68s; +surgery   Other Mother        hysterectomy at Rebecca age for unspecified cause   Heart Problems Mother    Breast cancer Cousin        maternal 1st cousin dx female breast cancer at 16-46y   Cancer Father    Hypertension Father    Heart Problems Maternal Aunt    Diabetes Maternal Aunt    Breast cancer Maternal Uncle        dx 64-65   Heart Problems Maternal Uncle    Breast cancer Maternal Grandmother 50   Throat cancer Maternal Grandfather        d. 87s; smoker   Sickle  cell anemia Paternal Aunt    Congestive Heart Failure Maternal Aunt    Multiple sclerosis Cousin    Cancer Other        maternal great uncle (MGM's brother); cancer removed from his side   Heart attack Paternal Aunt        d. early 22s  The patient has very little information about her father. Her mother is currently 20 years old. She had a history of cervical cancer at age 16. The patient had 2 brothers, no sisters. The maternal grandfather had throat cancer. A maternal uncle was diagnosed with breast cancer as well as prostate cancer at the age of 84. 2 maternal cousins, one of the mail, had breast cancer as well.   GYNECOLOGIC HISTORY:  No LMP recorded. Menarche age 1, first live birth age 63. The patient is GX P4. She was still having regular periods at the time of diagnosis. She took oral contraceptives in the 1990s with no side effects.--.  Stopped with chemotherapy and have not resumed as of May 2019   SOCIAL HISTORY:  (Updated 10/2018) She works as a Market researcher. The patient's significant other [name] works at [employer].  At home with  the patient are her 3 children [name], [place]. There are age 51, 43, and 63 as of November 2019.  2 of them are disabled or have significant health problems, one with sickle cell disease, the other with autism and developmental delay.  The patient's son [name], currently 30 years old, lives in [place].    ADVANCED DIRECTIVES: Not in place   HEALTH MAINTENANCE: Social History   Socioeconomic History   Marital status: Single    Spouse name: Not on file   Number of children: 4   Years of education: Not on file   Highest education level: Not on file  Occupational History   Occupation: Private Care Attendant    Comment: [employer] resource strain: Not on file   Food insecurity    Worry: Never true    Inability: Never true    Transportation needs    Medical: No    Non-medical: No  Tobacco Use   Smoking status: Former Smoker    Packs/day: 1.00    Years: 20.00    Pack years: 20.00    Types: Cigarettes    Quit date: 04/01/2018    Years since quitting: 1.2   Smokeless tobacco: Never Used   Tobacco comment: Patient has quit smoking x 1 year now  Substance and Sexual Activity   Alcohol use: Yes    Comment: occ   Drug use: No   Sexual activity: Not on file  Lifestyle   Physical activity    Days per week: 7 days    Minutes per session: 60 min   Stress: Not at all  Relationships   Social connections    Talks on phone: Not on file    Gets together: Not on file    Attends religious service: Not on file    Active member of club or organization: Not on file    Attends meetings of clubs or organizations: Not on file    Relationship status: Not on file  Other Topics Concern   Not on file  Social History Narrative   Not on file     Colonoscopy:  PAP:  Bone density:  Lipid panel:  No Known Allergies  Current Outpatient Medications on File Prior to Visit  Medication Sig Dispense Refill   gabapentin (NEURONTIN) 300 MG capsule Take 1 tablet at bedtime for one week, then increase to 1 tablet morning and bedtime. 60 capsule 3   omeprazole (PRILOSEC) 20 MG capsule Take 1 capsule (20 mg total) by mouth daily. 30 capsule 1   traMADol (ULTRAM) 50 MG tablet Take 1 tablet (50 mg total) by mouth every 6 (six) hours as needed. 30 tablet 0   No current facility-administered medications on file prior to visit.     OBJECTIVE: Rebecca [name] who appears stated age 30:   07/02/19 1533  BP: 129/87  Pulse: (!) 102  Resp: 18  Temp: 98.5 F (36.9 C)  SpO2: 100%   Filed Weights   07/02/19 1533  Weight: 213 lb 9 oz (96.9 kg)   Body mass index is 39.7 kg/m.   ECOG FS: 1 - Symptomatic but completely ambulatory  Sclerae unicteric, EOMs intact Wearing a mask Palpable left  axillary adenopathy.  I do not palpate the supraclavicular adenopathy noted on PET scan Lungs no rales or rhonchi Heart regular rate and rhythm Abd soft, obese, nontender, positive bowel sounds MSK no focal spinal tenderness, no  upper extremity lymphedema Neuro: nonfocal, well oriented, appropriate affect Breasts: The right chest wall is imaged below.  The left breast is unremarkable.   Right Chest Wall 07/02/2019:      LAB RESULTS: CMP Latest Ref Rng & Units 06/04/2019 01/23/2019 10/11/2018  Glucose 70 - 99 mg/dL 96 85 106(H)  BUN 6 - 20 mg/dL _0 Creatinine 0.44 - 1.00 mg/dL 0.82 1.14(H) 0.85  Sodium 135 - 145 mmol/L 143 141 143  Potassium 3.5 - 5.1 mmol/L 3.7 3.9 3.8  Chloride 98 - 111 mmol/L 108 108 108  CO2 22 - 32 mmol/L _1 Calcium 8.9 - 10.3 mg/dL 9.2 9.2 9.8  Total Protein 6.5 - 8.1 g/dL 7.9 7.8 7.8  Total Bilirubin 0.3 - 1.2 mg/dL 0.2(L) 0.3 0.3  Alkaline Phos 38 - 126 U/L 78 82 83  AST 15 - 41 U/L 15 13(L) 16  ALT 0 - 44 U/L _2 CBC    Component Value Date/Time   WBC 12.3 (H) 06/04/2019 1218   WBC 11.1 (H) 01/23/2019 0906   RBC 3.98 06/04/2019 1218   HGB 11.1 (L) 06/04/2019 1218   HGB 10.4 (L) 10/04/2016 1154   HCT 33.7 (L) 06/04/2019 1218   HCT 31.3 (L) 10/04/2016 1154   PLT 243 06/04/2019 1218   PLT 320 10/04/2016 1154   MCV 84.7 06/04/2019 1218   MCV 95.7 10/04/2016 1154   MCH 27.9 06/04/2019 1218   MCHC 32.9 06/04/2019 1218   RDW 14.5 06/04/2019 1218   RDW 16.3 (H) 10/04/2016 1154   LYMPHSABS 2.5 06/04/2019 1218   LYMPHSABS 1.7 10/04/2016 1154   MONOABS 0.9 06/04/2019 1218   MONOABS 0.5 10/04/2016 1154   EOSABS 0.2 06/04/2019 1218   EOSABS 0.3 10/04/2016 1154   BASOSABS 0.0 06/04/2019 1218   BASOSABS 0.0 10/04/2016 1154    STUDIES: Ct Chest W Contrast  Result Date: 06/11/2019 CLINICAL DATA:  Remote history of breast cancer with initial diagnosis 2016. Patient is having pain and swelling in the right upper chest. EXAM: CT  CHEST WITH CONTRAST TECHNIQUE: Multidetector CT imaging of the chest was performed during intravenous contrast administration. CONTRAST:  26m OMNIPAQUE IOHEXOL 300 MG/ML  SOLN COMPARISON:  Chest CT 05/19/2012 FINDINGS: Cardiovascular: The heart is normal in size. No pericardial effusion. The aorta is normal in caliber. No dissection. No atherosclerotic calcifications. A few scattered coronary artery calcifications are suspected. Mediastinum/Nodes: No mediastinal or hilar mass or adenopathy. There is some residual thymic tissue noted in the anterior mediastinum but I do not see any significant change since 2013. The esophagus is grossly normal.  The thyroid gland is unremarkable. Lungs/Pleura: No worrisome pulmonary lesions or pulmonary nodules to suggest metastatic disease. There are radiation changes involving the anterior aspect of the right lung. No pleural effusions. No pleural nodules. Upper Abdomen: No significant upper abdominal findings. I do not see any findings suspicious for upper abdominal metastatic disease. Musculoskeletal: There are surgical changes from a right mastectomy. There is also skin thickening and probable radiation changes. The lateral margin of the pectoralis major muscle is abnormally thickened and has almost a rounded configuration measuring up to 3 cm in diameter. This is very asymmetric when compared to the other muscle. However, it could be related to surgery and radiation. Slightly more laterally in the axilla there is an area of nodularity with 2 small adjacent nodules or possibly lymph nodes measuring 8 mm each. The fat plane between the pectoralis  major and minor muscle is also indistinct although I do not see a definite lesion in between them. No lower axillary lymph nodes. Along the inferior margin of the mastectomy scar along the lower margin of the pectoralis the anterior surface is very irregular and nodular in appearance. Again this could be surgical and radiation changes.  No bone lesions are identified to suggest osseous metastatic disease and I do not see any anterior rib destruction. IMPRESSION: 1. Surgical changes from a right mastectomy. Without a prior postmastectomy study for comparison it is difficult to know whether all these changes are postsurgical/post radiation or whether there could be recurrent tumor. I am most worried about the rounded masslike appearance of the lateral aspect of the pectoralis muscle. There is also mild nodularity in the right axilla and some nodularity along the anterior margin of the lower pectoralis muscle. Recommend correlation with physical examination. A PET-CT may be helpful to exclude any hypermetabolic recurrent tumor and to direct a potential biopsy. 2. No supraclavicular adenopathy and no findings suspicious for mediastinal or hilar disease or pulmonary metastasis. 3. No findings suspicious for osseous metastatic disease. Electronically Signed   By: Marijo Sanes M.D.   On: 06/11/2019 20:45   Nm Pet Image Initial (pi) Skull Base To Thigh  Result Date: 06/19/2019 CLINICAL DATA:  Initial treatment strategy for breast cancer. EXAM: NUCLEAR MEDICINE PET SKULL BASE TO THIGH TECHNIQUE: 10 mCi F-18 FDG was injected intravenously. Full-ring PET imaging was performed from the skull base to thigh after the radiotracer. CT data was obtained and used for attenuation correction and anatomic localization. Fasting blood glucose: 90 mg/dl COMPARISON:  CT chest 06/11/2019 FINDINGS: Mediastinal blood pool activity: SUV max 2.7 Liver activity: SUV max NA NECK: Nonspecific hypermetabolism noted in the nasopharynx and tonsillar regions. No hypermetabolic cervical lymphadenopathy. Incidental CT findings: none CHEST: 2.7 x 3.1 cm mass lesion in the right pectoralis major muscle is markedly hypermetabolic with SUV max = 72.5. 10 mm nodule/lymph node in the right axilla is hypermetabolic with SUV max = 36.6. Focal hypermetabolism in the medial aspect of the  right pectoralis major muscle demonstrates SUV max = 6.7. 1.1 cm right internal mammary node seen on 53/4 demonstrates SUV max = 6.7. 1.6 cm short axis left axillary node is hypermetabolic with SUV max = 44.0. Soft tissue attenuation in the inferior aspect of the mastectomy bed is hypermetabolic with SUV max = 34.7 Tiny 5 mm nodule in the inferior right axilla (42/5) is hypermetabolic with SUV max = 2.6. Incidental CT findings: No pericardial or pleural effusion no suspicious pulmonary nodule or mass. No pleural effusion. ABDOMEN/PELVIS: No abnormal hypermetabolic activity within the liver, pancreas, adrenal glands, or spleen. No hypermetabolic lymph nodes in the abdomen or pelvis. Liver activity is mottled, but no definite hypermetabolic metastatic lesion. Incidental CT findings: none SKELETON: Mottled FDG accumulation is identified in the marrow of the thoracolumbar spine. This may be related to stimulatory effects of therapy, but bony involvement by metastatic disease cannot be entirely excluded. Incidental CT findings: No lytic or sclerotic osseous lesions evident by CT. IMPRESSION: 1. Multifocal hypermetabolic disease in the right anterior chest wall in this patient status post right mastectomy. Dominant lesion is a 3.1 cm mass in the right pectoralis major muscle. 2. Hypermetabolic right internal mammary lymph nodes with hypermetabolic nodes in the right axilla. 3. Large hypermetabolic nodal metastasis in the left axilla. 4. No definite hypermetabolic metastatic disease in the neck, abdomen, or pelvis. 5. Mottled diffuse marrow uptake  is presumably related to marrow response to therapy although bony metastatic disease could have this appearance. Electronically Signed   By: Misty Stanley M.D.   On: 06/19/2019 16:03   US Breast Ltd Uni Right Inc Axilla  Result Date: 06/27/2019 CLINICAL DATA:  43 year old female status post right mastectomy in 2017. Patient had a recent PET scan on 06/19/2019 which  demonstrated a hypermetabolic anterior chest wall mass at the mastectomy site originating from the pectoralis major muscle. Additionally, hypermetabolic lymphadenopathy was seen in the bilateral axilla. The patient presents today for additional diagnostic workup. EXAM: DIGITAL DIAGNOSTIC LEFT MAMMOGRAM WITH CAD AND TOMO ULTRASOUND BILATERAL AXILLA COMPARISON:  Previous exam(s). ACR Breast Density Category b: There are scattered areas of fibroglandular density. FINDINGS: No suspicious mammographic findings identified within left breast. The parenchymal pattern stable. An enlarged lymph node is partially visualized in the left axilla. Mammographic images were processed with CAD. Targeted ultrasound is performed, showing a bulky, heterogenous mass within the right upper outer quadrant of the right mastectomy bed. Exact measurements are difficult due to size, but it measures approximately 5.1 x 4.3 x 2.5 cm. Note is made of associated vascularity. This correlates with the PET CT findings. Additionally, two adjacent, morphologically abnormal lymph nodes with complete effacement of the fatty hila are noted in the right axilla. The larger one measures approximately 10 x 10 x 8 mm and likely corresponds with the PET-CT findings. Within the left axilla, there is an enlarged, morphologically abnormal lymph node with complete obliteration of the fatty hilum. It measures up to 1.4 cm in short axis dimension. IMPRESSION: 1. Suspicious right upper outer quadrant mastectomy bed mass corresponding with recent PET-CT findings. Recommendation is for ultrasound-guided biopsy. 2. Suspicious bilateral axillary lymphadenopathy corresponding with PET-CT findings. Recommendation is for ultrasound-guided biopsy. 3. No mammographic evidence of malignancy within the left breast. RECOMMENDATION: Three area ultrasound-guided biopsy to include right mastectomy bed mass and bilateral axillary lymph nodes. This has been scheduled for 06/28/2019 at  8:30 a.m. I have discussed the findings and recommendations with the patient. Results were also provided in writing at the conclusion of the visit. If applicable, a reminder letter will be sent to the patient regarding the next appointment. BI-RADS CATEGORY  5: Highly suggestive of malignancy. Electronically Signed   By: Kristopher Oppenheim M.D.   On: 06/27/2019 14:35   Mm Diag Breast Tomo Uni Left  Result Date: 06/27/2019 CLINICAL DATA:  43 year old female status post right mastectomy in 2017. Patient had a recent PET scan on 06/19/2019 which demonstrated a hypermetabolic anterior chest wall mass at the mastectomy site originating from the pectoralis major muscle. Additionally, hypermetabolic lymphadenopathy was seen in the bilateral axilla. The patient presents today for additional diagnostic workup. EXAM: DIGITAL DIAGNOSTIC LEFT MAMMOGRAM WITH CAD AND TOMO ULTRASOUND BILATERAL AXILLA COMPARISON:  Previous exam(s). ACR Breast Density Category b: There are scattered areas of fibroglandular density. FINDINGS: No suspicious mammographic findings identified within left breast. The parenchymal pattern stable. An enlarged lymph node is partially visualized in the left axilla. Mammographic images were processed with CAD. Targeted ultrasound is performed, showing a bulky, heterogenous mass within the right upper outer quadrant of the right mastectomy bed. Exact measurements are difficult due to size, but it measures approximately 5.1 x 4.3 x 2.5 cm. Note is made of associated vascularity. This correlates with the PET CT findings. Additionally, two adjacent, morphologically abnormal lymph nodes with complete effacement of the fatty hila are noted in the right axilla. The larger one measures  approximately 10 x 10 x 8 mm and likely corresponds with the PET-CT findings. Within the left axilla, there is an enlarged, morphologically abnormal lymph node with complete obliteration of the fatty hilum. It measures up to 1.4 cm in  short axis dimension. IMPRESSION: 1. Suspicious right upper outer quadrant mastectomy bed mass corresponding with recent PET-CT findings. Recommendation is for ultrasound-guided biopsy. 2. Suspicious bilateral axillary lymphadenopathy corresponding with PET-CT findings. Recommendation is for ultrasound-guided biopsy. 3. No mammographic evidence of malignancy within the left breast. RECOMMENDATION: Three area ultrasound-guided biopsy to include right mastectomy bed mass and bilateral axillary lymph nodes. This has been scheduled for 06/28/2019 at 8:30 a.m. I have discussed the findings and recommendations with the patient. Results were also provided in writing at the conclusion of the visit. If applicable, a reminder letter will be sent to the patient regarding the next appointment. BI-RADS CATEGORY  5: Highly suggestive of malignancy. Electronically Signed   By: Kristopher Oppenheim M.D.   On: 06/27/2019 14:35   [place] Axillary Node Core Biopsy Left  Addendum Date: 07/01/2019   ADDENDUM REPORT: 07/01/2019 11:17 ADDENDUM: Pathology revealed GRADE III INVASIVE DUCTAL CARCINOMA of the RIGHT mastectomy bed, upper outer, palpable chest wall mass. This was found to be concordant by Dr. Curlene Dolphin. Pathology revealed INVASIVE DUCTAL CARCINOMA of the RIGHT axillary lymph node. There is no distinct nodal tissue identified. This was found to be concordant by Dr. Curlene Dolphin. Pathology revealed INVASIVE DUCTAL CARCINOMA of the LEFT axillary lymph node. There is no distinct nodal tissue identified. This was found to be concordant by Dr. Curlene Dolphin. Pathology results were discussed with the patient by telephone. The patient reported doing well after the biopsies with tenderness at the sites. Post biopsy instructions and care were reviewed and questions were answered. The patient was encouraged to call [facility] for any additional concerns. Surgical consultation has been arranged with Dr. Alphonsa Overall, per patient request, at [facility] on July 11, 2019. The patient is scheduled to see Dr. Tressa Busman at [facility] on July 02, 2019. Pathology results were called to Bary Castilla, RN, Nurse Navigator, at [facility] on July 01, 2019. Pathology results reported by Terie Purser, RN on 07/01/2019. Electronically Signed   By: Curlene Dolphin M.D.   On: 07/01/2019 11:17   Result Date: 07/01/2019 CLINICAL DATA:  Ultrasound-guided core needle biopsy was recommended a suspicious left axillary lymph node. EXAM: [place] AXILLARY NODE CORE BIOPSY LEFT COMPARISON:  Previous exam(s). FINDINGS: I met with the patient and we discussed the procedure of ultrasound-guided biopsy, including benefits and alternatives. We discussed the high likelihood of a successful procedure. We discussed the risks of the procedure, including infection, bleeding, tissue injury, clip migration, and inadequate sampling. Informed written consent was given. The usual time-out protocol was performed immediately prior to the procedure. Using sterile technique and 1% Lidocaine as local anesthetic, under direct ultrasound visualization, a 14 gauge spring-loaded device was used to perform biopsy of a suspicious left axillary lymph node using a lateral approach. At the conclusion of the procedure a HydroMARK tissue marker clip was deployed into the biopsy cavity. IMPRESSION: Ultrasound guided biopsy of left axillary lymph node. No apparent complications. Electronically Signed: By: Curlene Dolphin M.D. On: 06/28/2019 10:19   [place] Axillary Node Core Biopsy Right  Addendum Date: 07/01/2019   ADDENDUM REPORT: 07/01/2019 11:17 ADDENDUM: Pathology revealed GRADE III INVASIVE DUCTAL CARCINOMA of the RIGHT mastectomy  bed, upper outer, palpable chest wall mass. This was found to be concordant by Dr. Curlene Dolphin. Pathology revealed INVASIVE DUCTAL CARCINOMA of the RIGHT axillary lymph node. There is no distinct  nodal tissue identified. This was found to be concordant by Dr. Curlene Dolphin. Pathology revealed INVASIVE DUCTAL CARCINOMA of the LEFT axillary lymph node. There is no distinct nodal tissue identified. This was found to be concordant by Dr. Curlene Dolphin. Pathology results were discussed with the patient by telephone. The patient reported doing well after the biopsies with tenderness at the sites. Post biopsy instructions and care were reviewed and questions were answered. The patient was encouraged to call [facility] for any additional concerns. Surgical consultation has been arranged with Dr. Alphonsa Overall, per patient request, at [facility] on July 11, 2019. The patient is scheduled to see Dr. Tressa Busman at [facility] on July 02, 2019. Pathology results were called to Bary Castilla, RN, Nurse Navigator, at [facility] on July 01, 2019. Pathology results reported by Terie Purser, RN on 07/01/2019. Electronically Signed   By: Curlene Dolphin M.D.   On: 07/01/2019 11:17   Result Date: 07/01/2019 CLINICAL DATA:  Ultrasound-guided core needle biopsy was recommended of a suspicious right axillary lymph node. EXAM: [place] AXILLARY NODE CORE BIOPSY RIGHT COMPARISON:  Previous exam(s). FINDINGS: I met with the patient and we discussed the procedure of ultrasound-guided biopsy, including benefits and alternatives. We discussed the high likelihood of a successful procedure. We discussed the risks of the procedure, including infection, bleeding, tissue injury, clip migration, and inadequate sampling. Informed written consent was given. The usual time-out protocol was performed immediately prior to the procedure. Using sterile technique and 1% Lidocaine with and without epinephrine as local anesthetic, under direct ultrasound visualization, a 14 gauge spring-loaded device was used to perform biopsy of a suspicious right axillary lymph node using a  lateral approach. At the conclusion of the procedure a HydroMARK tissue marker clip was deployed into the biopsy cavity. IMPRESSION: Ultrasound guided biopsy of right axillary lymph node. No apparent complications. Electronically Signed: By: Curlene Dolphin M.D. On: 06/28/2019 10:18   [place] Axilla Left  Result Date: 06/27/2019 CLINICAL DATA:  43 year old female status post right mastectomy in 2017. Patient had a recent PET scan on 06/19/2019 which demonstrated a hypermetabolic anterior chest wall mass at the mastectomy site originating from the pectoralis major muscle. Additionally, hypermetabolic lymphadenopathy was seen in the bilateral axilla. The patient presents today for additional diagnostic workup. EXAM: DIGITAL DIAGNOSTIC LEFT MAMMOGRAM WITH CAD AND TOMO ULTRASOUND BILATERAL AXILLA COMPARISON:  Previous exam(s). ACR Breast Density Category b: There are scattered areas of fibroglandular density. FINDINGS: No suspicious mammographic findings identified within left breast. The parenchymal pattern stable. An enlarged lymph node is partially visualized in the left axilla. Mammographic images were processed with CAD. Targeted ultrasound is performed, showing a bulky, heterogenous mass within the right upper outer quadrant of the right mastectomy bed. Exact measurements are difficult due to size, but it measures approximately 5.1 x 4.3 x 2.5 cm. Note is made of associated vascularity. This correlates with the PET CT findings. Additionally, two adjacent, morphologically abnormal lymph nodes with complete effacement of the fatty hila are noted in the right axilla. The larger one measures approximately 10 x 10 x 8 mm and likely corresponds with the PET-CT findings. Within the left axilla, there is an enlarged, morphologically abnormal lymph node with complete obliteration of the  fatty hilum. It measures up to 1.4 cm in short axis dimension. IMPRESSION: 1. Suspicious right upper outer quadrant mastectomy bed mass  corresponding with recent PET-CT findings. Recommendation is for ultrasound-guided biopsy. 2. Suspicious bilateral axillary lymphadenopathy corresponding with PET-CT findings. Recommendation is for ultrasound-guided biopsy. 3. No mammographic evidence of malignancy within the left breast. RECOMMENDATION: Three area ultrasound-guided biopsy to include right mastectomy bed mass and bilateral axillary lymph nodes. This has been scheduled for 06/28/2019 at 8:30 a.m. I have discussed the findings and recommendations with the patient. Results were also provided in writing at the conclusion of the visit. If applicable, a reminder letter will be sent to the patient regarding the next appointment. BI-RADS CATEGORY  5: Highly suggestive of malignancy. Electronically Signed   By: Kristopher Oppenheim M.D.   On: 06/27/2019 14:35   [place] Rt Breast Bx W Loc Dev 1st Lesion Img Bx Spec US Guide  Addendum Date: 07/01/2019   ADDENDUM REPORT: 07/01/2019 11:17 ADDENDUM: Pathology revealed GRADE III INVASIVE DUCTAL CARCINOMA of the RIGHT mastectomy bed, upper outer, palpable chest wall mass. This was found to be concordant by Dr. Curlene Dolphin. Pathology revealed INVASIVE DUCTAL CARCINOMA of the RIGHT axillary lymph node. There is no distinct nodal tissue identified. This was found to be concordant by Dr. Curlene Dolphin. Pathology revealed INVASIVE DUCTAL CARCINOMA of the LEFT axillary lymph node. There is no distinct nodal tissue identified. This was found to be concordant by Dr. Curlene Dolphin. Pathology results were discussed with the patient by telephone. The patient reported doing well after the biopsies with tenderness at the sites. Post biopsy instructions and care were reviewed and questions were answered. The patient was encouraged to call [facility] for any additional concerns. Surgical consultation has been arranged with Dr. Alphonsa Overall, per patient request, at [facility] on July 11, 2019.  The patient is scheduled to see Dr. Tressa Busman at [facility] on July 02, 2019. Pathology results were called to Bary Castilla, RN, Nurse Navigator, at [facility] on July 01, 2019. Pathology results reported by Terie Purser, RN on 07/01/2019. Electronically Signed   By: Curlene Dolphin M.D.   On: 07/01/2019 11:17   Result Date: 07/01/2019 CLINICAL DATA:  Ultrasound-guided core needle biopsy was recommended of a suspicious palpable mass right mastectomy bed upper outer quadrant. EXAM: ULTRASOUND GUIDED RIGHT BREAST (MASTECTOMY) CORE NEEDLE BIOPSY COMPARISON:  Previous exam(s). FINDINGS: I met with the patient and we discussed the procedure of ultrasound-guided biopsy, including benefits and alternatives. We discussed the high likelihood of a successful procedure. We discussed the risks of the procedure, including infection, bleeding, tissue injury, clip migration, and inadequate sampling. Informed written consent was given. The usual time-out protocol was performed immediately prior to the procedure. Lesion quadrant: Upper outer quadrant Using sterile technique and 1% Lidocaine with and without epinephrine as local anesthetic, under direct ultrasound visualization, a 14 gauge spring-loaded device was used to perform biopsy of the mass using a lateral approach. At the conclusion of the procedure a HydroMARK tissue marker clip was deployed into the biopsy cavity. IMPRESSION: Ultrasound guided biopsy of the right mastectomy bed. No apparent complications. Electronically Signed: By: Curlene Dolphin M.D. On: 06/28/2019 10:13    RESEARCH: Referred to PREVENT study, but was pregnant at the time; referred to [facility] a 11202, but the biopsied lymph node was benign; referred to weight loss study but declined; referred to [facility] 81 12/09/2000, but the timing  of radiation and 8 was greater than 60 days past the date of diagnosis; referred to health disparity study, but declined; referred  to [id number] adjuvant therapy study, but the patient received Xeloda with radiation and therefore was ineligible  ASSESSMENT: 43 y.o. Butler [name] status post right breast upper-outer quadrant biopsy 01/28/2016 for a clinical T2 N0 invasive ductal carcinoma, grade 3, triple negative, with an MIB-1 of 70%.  (a) suspicious right axillary lymph node biopsied 01/28/2016 was benign  (1) neoadjuvant chemotherapy: doxorubicin and cyclophosphamide in dose dense fashion 4 started 04/14/16, completed 05/26/2016, followed by paclitaxel and carboplatin weekly 12, Started 06/09/2016  (a) taxol discontinued after 7 doses because of neuropathy, last dose 07/21/2016  (2) genetics testing October 20, 2016 through the 32-gene Comprehensive Cancer Panel offered by [facility] Junius Roads, MD) (with MSH2 Exons 1-7 Inversion Analysis) found no deleterious mutations or VUSS  In APC, ATM, AXIN2, BARD1, BMPR1A, BRCA1, BRCA2, BRIP1, CDH1, CDK4, CDKN2A, CHEK2, EPCAM, FANCC, MLH1, MSH2, MSH6, MUTYH, NBN, PALB2, PMS2, POLD1, POLE, PTEN, RAD51C, RAD51D, SCG5/GREM1, SMAD4, STK11, TP53, VHL, and XRCC2.    (3) right mastectomy and sentinel lymph node sampling 09/06/2016 showed a residual ypT1c ypN0, invasive ductal carcinoma, grade 3, with negative margins. Repeat prognostic panel again triple negative   (4) adjuvant radiation with capecitabine/Xeloda sensitization 11/15/16 - 01/12/17 Site/dose:   Right Chest Wall and axilla (4 field) treated to 45 Gy in 25 fractions, and then Boosted an additional 14.4 Gy in 8 fractions.  (5) tobacco abuse disorder: The patient quit smoking 04/04/2018  METASTATIC DISEASE:  July 2020 (6) nonspecific changes noted on chest CT 06/11/2019 were clarified by PET scan 06/19/2019 showing hypermetabolic disease in the right anterior chest wall, right internal mammary nodes, right and left axillary nodes, but no metastatic disease in the neck, lungs, abdomen or pelvis.  Mild told marrow uptake  suggests bony metastatic disease.  (a) CARIS requested 07/03/19  (7) zoledronate to start 07/11/2019  (8) carboplatin/ gemcitabine days 1/8 Q21 day cycle to start 07/11/2019  PLAN: Rebecca [name] now has stage 4 breast cancer. She understands that stage IV breast cancer is not curable with our current knowledge base. The goal of treatment is control. The strategy of treatment is to do only the minimum necessary to control the growth of the tumor so that the patient can have as normal a life as possible. There is no survival advantage in treating aggressively if treating less aggressively results in tumor control. With this strategy stage IV breast cancer in many cases can function as a "chronic illness": something that cannot be quite gotten rid of but can be controlled for an indefinite period of time  Because her cancer remains triple negative, we cannot use antiestrogens or anti-HER-2 treatments and chemotherapy is the only standard option.  We will request molecular studies to see if she would benefit from pembrolizumab, alpelisib, or AKT inhibitors.  She will need a port.  I have alerted her surgeon Dr. [name] regarding this  Because of her peripheral neuropathy from the earlier Taxol treatments we are limited in her chemotherapy choices.  We will start carboplatin and gemcitabine 07/11/2019.  Today we discussed the possible toxicities side effects and complications of these agents  Dalyla was very stoic call today.  This was devastating and overwhelming news when she was able to absorb it and proceed to planning.  If the planned chemotherapy does not work she may qualify for an upcoming study which should be available here in approximately 2 months  She will see me again 07/11/2019 at the start of her chemotherapy  I spent approximately 40 minutes face to face with Rebecca [name] with more than 50% of that time spent in counseling and coordination of care.     Rebecca [name]. Josmar Messimer, MD  07/02/19 5:48  PM Medical Oncology and Hematology [facility] [address] [place], [place] [address] Tel. [phone]    Fax. [phone]    I, Wilburn Mylar, am acting as scribe for Dr. Virgie [name]. Rebecca [name].  I, Lurline Del MD, have reviewed the above documentation for accuracy and completeness, and I agree with the above.

## 2019-07-02 NOTE — Progress Notes (Signed)
START OFF PATHWAY REGIMEN - Breast   [id number]:Gemcitabine + Carboplatin (1000/2) q21 Days:   A cycle is every 21 days:     Gemcitabine      Carboplatin   **Always confirm dose/schedule in your pharmacy ordering system**  Patient Characteristics: Distant Metastases or Locoregional Recurrent Disease - Unresected or Locally Advanced Unresectable Disease Progressing after Neoadjuvant and Local Therapies, HER2 Negative/Unknown/Equivocal, ER Negative/Unknown, Chemotherapy, First Line, ER Negative,  PD-L1 Expression Negative/Unknown Therapeutic Status: Distant Metastases BRCA Mutation Status: Absent ER Status: Negative (-) HER2 Status: Negative (-) PR Status: Negative (-) Line of Therapy: First Line PD-L1 Expression Status: Did Not Order Test Intent of Therapy: Non-Curative / Palliative Intent, Discussed with Patient

## 2019-07-03 ENCOUNTER — Telehealth: Payer: Self-pay

## 2019-07-03 ENCOUNTER — Telehealth: Payer: Self-pay | Admitting: Oncology

## 2019-07-03 ENCOUNTER — Other Ambulatory Visit: Payer: Self-pay | Admitting: Oncology

## 2019-07-03 NOTE — Telephone Encounter (Signed)
Rebecca [name] stopped by the office today to inform us that she had a dental cleaning and they are referring her to an oral surgeon for some extractions.  She has not yet started chemo and wants to know if OK for her to get extractions.  She states "they are booked and it will be a while before they can get me scheduled". Will review with provider.

## 2019-07-03 NOTE — Telephone Encounter (Signed)
I talk with patient regarding schedule  

## 2019-07-04 ENCOUNTER — Telehealth: Payer: Self-pay | Admitting: *Deleted

## 2019-07-04 MED ORDER — DEXAMETHASONE 4 MG PO TABS: 8.0000 mg | ORAL_TABLET | Freq: Every day | ORAL | 1 refills | Status: DC

## 2019-07-04 MED ORDER — LORAZEPAM 0.5 MG PO TABS: 0.5000 mg | ORAL_TABLET | Freq: Every evening | ORAL | 0 refills | Status: DC | PRN

## 2019-07-04 NOTE — Telephone Encounter (Signed)
This RN spoke with pt and discussed need to proceed to dental extractions asap due to starting chemo.   Rebecca [name] states she was contacted by the dental surgeons and will not be seen until later in August- she states she is not having any dental discomfort " just need extraction "  This RN offered to contact oral surgeon to discuss but Charley did not have the informaton. She stated " Oh I don't want it done right now anyway "  This RN informed pt need to discuss further at next visit so if need arises we can coordinate with her chemo therapy for best outcome.

## 2019-07-04 NOTE — Addendum Note (Signed)
Addended by: Lurline Del C on: 07/04/2019 11:15 AM   Modules accepted: Orders

## 2019-07-05 ENCOUNTER — Other Ambulatory Visit (HOSPITAL_COMMUNITY)
Admission: RE | Admit: 2019-07-05 | Discharge: 2019-07-05 | Disposition: A | Discharge status: Home / Self Care | Payer: Medicaid Other | Source: Ambulatory Visit | Attending: Surgery | Admitting: Surgery

## 2019-07-05 DIAGNOSIS — Z20828 Contact with and (suspected) exposure to other viral communicable diseases: Secondary | ICD-10-CM | POA: Insufficient documentation

## 2019-07-05 DIAGNOSIS — Z01812 Encounter for preprocedural laboratory examination: Secondary | ICD-10-CM | POA: Diagnosis not present

## 2019-07-06 LAB — SARS CORONAVIRUS 2 (TAT 6-24 HRS): SARS Coronavirus 2: NEGATIVE

## 2019-07-07 ENCOUNTER — Other Ambulatory Visit (HOSPITAL_COMMUNITY): Payer: Self-pay | Admitting: Surgery

## 2019-07-07 NOTE — H&P (Signed)
Rebecca [name]  Location: [facility] Patient #: [id number] DOB: January 29, 1976 Single / Language: Rebecca [name] / Race: Black or African American Female  History of Present Illness   The patient is a 43 year old female who presents with breast cancer.  Her PCP is Dr. Cammie Sickle  She is at the [facility] - [name]. She comes by herself.      She was seen by Wilber Bihari who was concerned about recurrence  Her Ca 27.29 - 118 (hi normal - 38) on 06/04/2019.   She had a chest CT - 06/11/2019 - 1. Surgical changes from a right mastectomy. Without a prior postmastectomy study for comparison it is difficult to know whether all these changes are postsurgical/post radiation or whether there could be recurrent tumor. I am most worried about the rounded masslike appearance of the lateral aspect of the pectoralis muscle. There is also mild nodularity in the right axilla and some nodularity along the anterior margin of the lower pectoralis muscle. Recommend correlation with physical examination. A PET-CT may be helpful to exclude any hypermetabolic recurrent tumor and to direct a potential biopsy.  2. No supraclavicular adenopathy and no findings suspicious for mediastinal or hilar disease or pulmonary metastasis.  3. No findings suspicious for osseous metastatic disease.   She had a PET scan on 06/19/2019 which showed:  1. Multifocal hypermetabolic disease in the right anterior chest wall in this patient status post right mastectomy. The dominant lesion is a 3.1 cm mass in the right pectoralis major muscle.  2. Hypermetabolic right internal mammary lymph nodes with hypermetabolic nodes in the right axilla.  3. Large hypermetabolic nodal metastasis in the left axilla.  4. No definite hypermetabolic metastatic disease in the neck, abdomen, or pelvis.  5. Mottled diffuse marrow uptake is presumably related to marrow response to therapy although bony metastatic disease could have  this appearance.   A biopsy of a left axillary lymph node and mastectomy bed mass on 06/28/2019 showed Invasive ductal cancer, triple negative.  She saw Dr. [name] on 07/02/2019.  He plans to give her carboplatin and gemcitabine.   I discussed the indications and potential complications of the power port placement.  The primary complications of the power port, include, but are not limited to, bleeding, infection, nerve injury, thrombosis, and pneumothorax.  Past Medical History: 1.  Recurrent right breast cancer  Biopsy of lymph nodes and mastectomy mass on 06/28/2019 shows IDC, triple negative.  2.  Right breast cancer  She underwent a biopsy on 01/28/2016 [phone]) which showed grade 3 IDC, ER - 0%, PR - 0%, Ki67 - 70%, Her2Neu - neg. Right axillary lymph node biopsy was benign.  She had neoadjuvant chemotx  Right mastectomy with right axillary SLNBx, removal of power port, 09/06/2016. [id number]) 2.0 cm IDC, Margins clear, 1/2 nodes positive.  3. Smokes  4. Sickle cell trait - [facility] by Dr. [name] 5. Power port placed by IR on 04/11/2016 Removed 09/06/2016 - D. Lilee Aldea 6.  Genetics  Her genetics came back negative.  Social History: Unmarried. (Not really divorced, though she has not seen her husband for 20 years) Has 4 children - 2 are special needs.  Rebecca [name] - 45 yo in [place], [place] 45 yo, [place] - 45 yo (she has some special need), Rebecca [name] - 45 yo (he has sickle cell and she has been at [facility] with him) The three youngest live with her.  The "dad" [facility] helps out, but it does not sound like he lives with her. He has to take care of his own mother. She comes with her mother, Rebecca [name]. She has not worked since last summer (2016). 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   Other Problems (Rebecca Eversole, LPN; 44/81/8563 1:49 PM) Anxiety Disorder   Breast Cancer  Depression  Gastroesophageal Reflux Disease  Lump In Breast   Past Surgical History (Rebecca Eversole, LPN; 70/26/3785 8:85 PM) Breast Biopsy  Right. Cesarean Section - Multiple   Diagnostic Studies History (Rebecca Eversole, LPN; 02/77/4128 7:86 PM) Colonoscopy  never Mammogram  within last year  Allergies (Rebecca Eversole, LPN; 76/72/0947 0:96 PM) No Known Drug Allergies  08/25/2016  Medication History (Rebecca Eversole, LPN; 28/36/6294 7:65 PM) Losartan Potassium (25MG Tablet, Oral half of a tablet daily) Active. Coreg (3.125MG Tablet, Oral) Active. Potassium Chloride (10MEQ Capsule ER, Oral) Active. Ketoconazole (2% Cream, External) Active. Effexor (75MG Tablet, Oral) Active. LORazepam (0.5MG Tablet, Oral) Active. Omeprazole (20MG Capsule DR, Oral) Active. Medications Reconciled  Social History (Rebecca Eversole, LPN; 46/50/3546 5:68 PM) Alcohol use  Occasional alcohol use. Caffeine use  Coffee. No drug use  Tobacco use  Current some day smoker.  Family History (Rebecca Borer, LPN; 12/75/1700 1:74 PM) Alcohol Abuse  Family Members In General. Arthritis  Family Members In General, Mother. Breast Cancer  Family Members In General. Cerebrovascular Accident  Family Members In General. Cervical Cancer  Family Members In General. Colon Polyps  Mother. Depression  Mother. Diabetes Mellitus  Family Members In General. Hypertension  Brother, Family Members In [place], Mother. Migraine Headache  Family Members In General. Seizure disorder  Family Members In General. Thyroid problems  Mother.  Pregnancy / Birth History Rebecca Borer, LPN; 94/49/6759 1:63 PM) Age at menarche  40 years. Maternal age  82-20 Para  4    Review of Systems (Rebecca Eversole LPN; 84/66/5993 5:70 PM) General Present- Chills, Fatigue and Weight Loss. Not Present- Appetite Loss, Fever, Night Sweats and Weight Gain. Skin Present- Dryness. Not Present-  Change in Wart/Mole, Hives, Jaundice, New Lesions, Non-Healing Wounds, Rash and Ulcer. HEENT Present- Seasonal Allergies. Not Present- Earache, Hearing Loss, Hoarseness, Nose Bleed, Oral Ulcers, Ringing in the Ears, Sinus Pain, Sore Throat, Visual Disturbances, Wears glasses/contact lenses and Yellow Eyes. Breast Present- Breast Mass. Not Present- Breast Pain, Nipple Discharge and Skin Changes. Gastrointestinal Present- Constipation and Nausea. Not Present- Abdominal Pain, Bloating, Bloody Stool, Change in Bowel Habits, Chronic diarrhea, Difficulty Swallowing, Excessive gas, Gets full quickly at meals, Hemorrhoids, Indigestion, Rectal Pain and Vomiting. Musculoskeletal Present- Joint Pain. Not Present- Back Pain, Joint Stiffness, Muscle Pain, Muscle Weakness and Swelling of Extremities. Neurological Present- Tingling. Not Present- Decreased Memory, Fainting, Headaches, Numbness, Seizures, Tremor, Trouble walking and Weakness. Psychiatric Present- Anxiety. Not Present- Bipolar, Change in Sleep Pattern, Depression, Fearful and Frequent crying.  Vitals (Rebecca Eversole LPN; 17/79/3903 0:09 PM) 09/15/2016 3:01 PM Weight: 180.4 lb Height: 61.5in Body Surface Area: 1.82 m Body Mass Index: 33.53 kg/m  Temp.: 98.65F (Oral)  Pulse: 80 (Regular)  BP: 124/80(Sitting, Left Arm, Standard)   Physical Exam  General: WN AA F alert and generally healthy appearing. HEENT: Normal. Pupils equal.  Neck: Supple. No mass. No thyroid mass.  Lymph Nodes: No supraclavicular or cervical nodes.  Breast: Right - Mastectomy incision with mass. Left - Somewhat ptotic. No mass or nodule.   Extremities: Good strength and ROM in upper and lower extremities.  Assessment & Plan  1.  Recurrent right breast cancer  Plan chemotherapy to restart 07/10/2019  Plan power port placement  they are planning to start chemotherapy on 8/5.  2.  BREAST CANCER, STAGE 1, RIGHT (C50.911)  Story: She underwent  a biopsy on 01/28/2016 ([id number]) which showed grade 3 IDC, ER - 0%, PR - 0%, Ki67 - 70%, Her2Neu - neg. Right axillary lymph node biopsy was benign.  Completed neoadjuvant chemotx   Right mastectomy with right axillary SLNBx, removal of power port, 09/06/2016. [phone]) 2.0 cm IDC, Margins clear, 1/2 nodes positive.  Oncology - Drs. Magrinat and Kinard  3. Sickle cell trait - She is [facility] by Dr. [name] 4.  Genetics  Her genetics came back negative.   Alphonsa Overall, MD, [facility] Pager: [phone] Office phone:  [phone]

## 2019-07-08 ENCOUNTER — Other Ambulatory Visit: Payer: Self-pay

## 2019-07-08 ENCOUNTER — Encounter (HOSPITAL_COMMUNITY): Payer: Self-pay | Admitting: *Deleted

## 2019-07-08 NOTE — Progress Notes (Signed)
Spoke with pt for pre-op call. Pt denies cardiac history or Diabetes. Pt has hx of HTN but is on no meds. States she changed her diet and it's under control.  PCP - Dr. Cammie Sickle  Pt had Covid test done 07/05/19 and it is negative. Pt states she has been in quarantine since.   Coronavirus Screening  Have you experienced the following symptoms:  Cough NO Fever (>100.83F)  NO Runny nose NO Sore throat NO Difficulty breathing/shortness of breath  NO  Have you or a family member traveled in the last 14 days and where? NO  Patient reminded that hospital visitation restrictions are in effect and the importance of the restrictions. Informed pt that she may have 1 visitor wait in the waiting room while she is getting ready for surgery and during surgery. She voiced understanding.

## 2019-07-09 ENCOUNTER — Ambulatory Visit (HOSPITAL_COMMUNITY): Payer: Medicaid Other

## 2019-07-09 ENCOUNTER — Ambulatory Visit (HOSPITAL_COMMUNITY): Payer: Medicaid Other | Admitting: Anesthesiology

## 2019-07-09 ENCOUNTER — Other Ambulatory Visit: Payer: Self-pay

## 2019-07-09 ENCOUNTER — Ambulatory Visit (HOSPITAL_COMMUNITY)
Admission: RE | Admit: 2019-07-09 | Discharge: 2019-07-09 | Disposition: A | Discharge status: Home / Self Care | Payer: Medicaid Other | Attending: Surgery | Admitting: Surgery

## 2019-07-09 ENCOUNTER — Encounter (HOSPITAL_COMMUNITY)
Admission: RE | Disposition: A | Discharge status: Home / Self Care | Payer: Self-pay | Source: Home / Self Care | Attending: Surgery

## 2019-07-09 ENCOUNTER — Encounter (HOSPITAL_COMMUNITY): Payer: Self-pay | Admitting: *Deleted

## 2019-07-09 DIAGNOSIS — F419 Anxiety disorder, unspecified: Secondary | ICD-10-CM | POA: Diagnosis not present

## 2019-07-09 DIAGNOSIS — Z803 Family history of malignant neoplasm of breast: Secondary | ICD-10-CM | POA: Diagnosis not present

## 2019-07-09 DIAGNOSIS — D573 Sickle-cell trait: Secondary | ICD-10-CM | POA: Insufficient documentation

## 2019-07-09 DIAGNOSIS — Z419 Encounter for procedure for purposes other than remedying health state, unspecified: Secondary | ICD-10-CM

## 2019-07-09 DIAGNOSIS — F329 Major depressive disorder, single episode, unspecified: Secondary | ICD-10-CM | POA: Diagnosis not present

## 2019-07-09 DIAGNOSIS — F17200 Nicotine dependence, unspecified, uncomplicated: Secondary | ICD-10-CM | POA: Insufficient documentation

## 2019-07-09 DIAGNOSIS — Z6839 Body mass index (BMI) 39.0-39.9, adult: Secondary | ICD-10-CM | POA: Insufficient documentation

## 2019-07-09 DIAGNOSIS — C50911 Malignant neoplasm of unspecified site of right female breast: Secondary | ICD-10-CM | POA: Diagnosis not present

## 2019-07-09 DIAGNOSIS — Z79899 Other long term (current) drug therapy: Secondary | ICD-10-CM | POA: Insufficient documentation

## 2019-07-09 DIAGNOSIS — I10 Essential (primary) hypertension: Secondary | ICD-10-CM | POA: Insufficient documentation

## 2019-07-09 DIAGNOSIS — Z452 Encounter for adjustment and management of vascular access device: Secondary | ICD-10-CM | POA: Diagnosis not present

## 2019-07-09 DIAGNOSIS — Z171 Estrogen receptor negative status [ER-]: Secondary | ICD-10-CM | POA: Diagnosis not present

## 2019-07-09 DIAGNOSIS — K219 Gastro-esophageal reflux disease without esophagitis: Secondary | ICD-10-CM | POA: Diagnosis not present

## 2019-07-09 DIAGNOSIS — Z95828 Presence of other vascular implants and grafts: Secondary | ICD-10-CM

## 2019-07-09 DIAGNOSIS — Z9011 Acquired absence of right breast and nipple: Secondary | ICD-10-CM | POA: Diagnosis not present

## 2019-07-09 DIAGNOSIS — C50411 Malignant neoplasm of upper-outer quadrant of right female breast: Secondary | ICD-10-CM | POA: Diagnosis not present

## 2019-07-09 HISTORY — PX: PORTACATH PLACEMENT: SHX2246

## 2019-07-09 HISTORY — DX: Pneumonia, unspecified organism: J18.9

## 2019-07-09 LAB — BASIC METABOLIC PANEL
Anion gap: 9 (ref 5–15)
BUN: 12 mg/dL (ref 6–20)
CO2: 24 mmol/L (ref 22–32)
Calcium: 9.3 mg/dL (ref 8.9–10.3)
Chloride: 108 mmol/L (ref 98–111)
Creatinine, Ser: 0.77 mg/dL (ref 0.44–1.00)
GFR calc Af Amer: >60 mL/min (ref >60)
GFR calc non Af Amer: >60 mL/min (ref >60)
Glucose, Bld: 104 mg/dL — ABNORMAL HIGH (ref 70–99)
Potassium: 3.3 mmol/L — ABNORMAL LOW (ref 3.5–5.1)
Sodium: 141 mmol/L (ref 135–145)

## 2019-07-09 LAB — CBC
HCT: 33.6 % — ABNORMAL LOW (ref 36.0–46.0)
Hemoglobin: 11 g/dL — ABNORMAL LOW (ref 12.0–15.0)
MCH: 28.2 pg (ref 26.0–34.0)
MCHC: 32.7 g/dL (ref 30.0–36.0)
MCV: 86.2 fL (ref 80.0–100.0)
Platelets: 254 10*3/uL (ref 150–400)
RBC: 3.9 MIL/uL (ref 3.87–5.11)
RDW: 14.8 % (ref 11.5–15.5)
WBC: 12.8 10*3/uL — ABNORMAL HIGH (ref 4.0–10.5)
nRBC: 0 % (ref 0.0–0.2)

## 2019-07-09 SURGERY — INSERTION, TUNNELED CENTRAL VENOUS DEVICE, WITH PORT
Anesthesia: General | Site: Breast

## 2019-07-09 MED ORDER — SODIUM CHLORIDE 0.9 % IV SOLN
INTRAVENOUS | Status: AC
  Filled 2019-07-09: qty 1.2

## 2019-07-09 MED ORDER — PROPOFOL 10 MG/ML IV BOLUS
INTRAVENOUS | Status: DC | PRN
  Administered 2019-07-09: 200 mg via INTRAVENOUS

## 2019-07-09 MED ORDER — FENTANYL CITRATE (PF) 250 MCG/5ML IJ SOLN
INTRAMUSCULAR | Status: AC
  Filled 2019-07-09: qty 5

## 2019-07-09 MED ORDER — BUPIVACAINE-EPINEPHRINE 0.25% -1:200000 IJ SOLN
INTRAMUSCULAR | Status: DC | PRN
  Administered 2019-07-09: 15 mL

## 2019-07-09 MED ORDER — CEFAZOLIN SODIUM-DEXTROSE 2-4 GM/100ML-% IV SOLN
2.0000 g | INTRAVENOUS | Status: AC
  Administered 2019-07-09: 2 g via INTRAVENOUS
  Filled 2019-07-09: qty 100

## 2019-07-09 MED ORDER — ACETAMINOPHEN 500 MG PO TABS
1000.0000 mg | ORAL_TABLET | ORAL | Status: AC
  Administered 2019-07-09: 1000 mg via ORAL
  Filled 2019-07-09: qty 2

## 2019-07-09 MED ORDER — LIDOCAINE 2% (20 MG/ML) 5 ML SYRINGE
INTRAMUSCULAR | Status: DC | PRN
  Administered 2019-07-09: 100 mg via INTRAVENOUS

## 2019-07-09 MED ORDER — DEXAMETHASONE SODIUM PHOSPHATE 10 MG/ML IJ SOLN
INTRAMUSCULAR | Status: AC
  Filled 2019-07-09: qty 1

## 2019-07-09 MED ORDER — FENTANYL CITRATE (PF) 100 MCG/2ML IJ SOLN
25.0000 ug | INTRAMUSCULAR | Status: DC | PRN
  Administered 2019-07-09: 09:00:00 25 ug via INTRAVENOUS

## 2019-07-09 MED ORDER — METOCLOPRAMIDE HCL 5 MG/ML IJ SOLN: 10.0000 mg | Freq: Once | INTRAMUSCULAR | Status: DC | PRN

## 2019-07-09 MED ORDER — LACTATED RINGERS IV SOLN
INTRAVENOUS | Status: DC | PRN
  Administered 2019-07-09: 07:00:00 via INTRAVENOUS

## 2019-07-09 MED ORDER — MEPERIDINE HCL 25 MG/ML IJ SOLN: 6.2500 mg | INTRAMUSCULAR | Status: DC | PRN

## 2019-07-09 MED ORDER — MIDAZOLAM HCL 2 MG/2ML IJ SOLN
INTRAMUSCULAR | Status: AC
  Filled 2019-07-09: qty 2

## 2019-07-09 MED ORDER — 0.9 % SODIUM CHLORIDE (POUR BTL) OPTIME
TOPICAL | Status: DC | PRN
  Administered 2019-07-09: 1000 mL

## 2019-07-09 MED ORDER — FENTANYL CITRATE (PF) 100 MCG/2ML IJ SOLN
INTRAMUSCULAR | Status: AC
  Administered 2019-07-09: 25 ug via INTRAVENOUS
  Filled 2019-07-09: qty 2

## 2019-07-09 MED ORDER — LIDOCAINE 2% (20 MG/ML) 5 ML SYRINGE
INTRAMUSCULAR | Status: AC
  Filled 2019-07-09: qty 5

## 2019-07-09 MED ORDER — HEPARIN SOD (PORK) LOCK FLUSH 100 UNIT/ML IV SOLN
INTRAVENOUS | Status: AC
  Filled 2019-07-09: qty 5

## 2019-07-09 MED ORDER — ONDANSETRON HCL 4 MG/2ML IJ SOLN
INTRAMUSCULAR | Status: AC
  Filled 2019-07-09: qty 2

## 2019-07-09 MED ORDER — CHLORHEXIDINE GLUCONATE CLOTH 2 % EX PADS: 6.0000 | MEDICATED_PAD | Freq: Once | CUTANEOUS | Status: DC

## 2019-07-09 MED ORDER — HEPARIN SOD (PORK) LOCK FLUSH 100 UNIT/ML IV SOLN
INTRAVENOUS | Status: DC | PRN
  Administered 2019-07-09: 500 [IU] via INTRAVENOUS

## 2019-07-09 MED ORDER — HYDROCODONE-ACETAMINOPHEN 5-325 MG PO TABS: 1.0000 | ORAL_TABLET | Freq: Four times a day (QID) | ORAL | 0 refills | Status: DC | PRN

## 2019-07-09 MED ORDER — ONDANSETRON HCL 4 MG/2ML IJ SOLN
INTRAMUSCULAR | Status: DC | PRN
  Administered 2019-07-09: 4 mg via INTRAVENOUS

## 2019-07-09 MED ORDER — LACTATED RINGERS IV SOLN: INTRAVENOUS | Status: DC

## 2019-07-09 MED ORDER — MIDAZOLAM HCL 2 MG/2ML IJ SOLN
INTRAMUSCULAR | Status: DC | PRN
  Administered 2019-07-09: 2 mg via INTRAVENOUS

## 2019-07-09 MED ORDER — PROPOFOL 10 MG/ML IV BOLUS
INTRAVENOUS | Status: AC
  Filled 2019-07-09: qty 20

## 2019-07-09 MED ORDER — BUPIVACAINE-EPINEPHRINE (PF) 0.25% -1:200000 IJ SOLN
INTRAMUSCULAR | Status: AC
  Filled 2019-07-09: qty 30

## 2019-07-09 MED ORDER — FENTANYL CITRATE (PF) 250 MCG/5ML IJ SOLN
INTRAMUSCULAR | Status: DC | PRN
  Administered 2019-07-09 (×3): 25 ug via INTRAVENOUS

## 2019-07-09 MED ORDER — SODIUM CHLORIDE 0.9 % IV SOLN
INTRAVENOUS | Status: DC | PRN
  Administered 2019-07-09: 40 mL

## 2019-07-09 MED ORDER — DEXAMETHASONE SODIUM PHOSPHATE 10 MG/ML IJ SOLN
INTRAMUSCULAR | Status: DC | PRN
  Administered 2019-07-09: 10 mg via INTRAVENOUS

## 2019-07-09 SURGICAL SUPPLY — 41 items
ADH SKN CLS APL DERMABOND .7 (GAUZE/BANDAGES/DRESSINGS)
APL SKNCLS STERI-STRIP NONHPOA (GAUZE/BANDAGES/DRESSINGS) ×1
BAG DECANTER FOR FLEXI CONT (MISCELLANEOUS) ×3 IMPLANT
BENZOIN TINCTURE PRP APPL 2/3 (GAUZE/BANDAGES/DRESSINGS) ×2 IMPLANT
CHLORAPREP W/TINT 10.5 ML (MISCELLANEOUS) ×3 IMPLANT
CLOSURE STERI-STRIP 1/2X4 (GAUZE/BANDAGES/DRESSINGS) ×1
CLSR STERI-STRIP ANTIMIC 1/2X4 (GAUZE/BANDAGES/DRESSINGS) ×1 IMPLANT
COVER SURGICAL LIGHT HANDLE (MISCELLANEOUS) ×3 IMPLANT
COVER TRANSDUCER ULTRASND GEL (DRAPE) IMPLANT
COVER WAND RF STERILE (DRAPES) ×1 IMPLANT
DERMABOND ADVANCED (GAUZE/BANDAGES/DRESSINGS)
DERMABOND ADVANCED .7 DNX12 (GAUZE/BANDAGES/DRESSINGS) ×1 IMPLANT
DRAPE C-ARM 42X72 X-RAY (DRAPES) ×3 IMPLANT
DRAPE CHEST BREAST 15X10 FENES (DRAPES) ×3 IMPLANT
ELECT CAUTERY BLADE 6.4 (BLADE) ×3 IMPLANT
ELECT REM PT RETURN 9FT ADLT (ELECTROSURGICAL) ×3
ELECTRODE REM PT RTRN 9FT ADLT (ELECTROSURGICAL) ×1 IMPLANT
GAUZE 4X4 16PLY RFD (DISPOSABLE) ×3 IMPLANT
GAUZE SPONGE 4X4 12PLY STRL (GAUZE/BANDAGES/DRESSINGS) ×3 IMPLANT
GEL ULTRASOUND 20GR AQUASONIC (MISCELLANEOUS) ×2 IMPLANT
GLOVE SURG SIGNA 7.5 PF LTX (GLOVE) ×3 IMPLANT
GOWN STRL REUS W/ TWL LRG LVL3 (GOWN DISPOSABLE) ×1 IMPLANT
GOWN STRL REUS W/ TWL XL LVL3 (GOWN DISPOSABLE) ×1 IMPLANT
GOWN STRL REUS W/TWL LRG LVL3 (GOWN DISPOSABLE) ×3
GOWN STRL REUS W/TWL XL LVL3 (GOWN DISPOSABLE) ×3
INTRODUCER 13FR (INTRODUCER) IMPLANT
KIT BASIN OR (CUSTOM PROCEDURE TRAY) ×3 IMPLANT
KIT PORT POWER 8FR ISP CVUE (Port) ×2 IMPLANT
KIT TURNOVER KIT B (KITS) ×3 IMPLANT
NS IRRIG 1000ML POUR BTL (IV SOLUTION) ×3 IMPLANT
PAD ARMBOARD 7.5X6 YLW CONV (MISCELLANEOUS) ×3 IMPLANT
PENCIL BUTTON HOLSTER BLD 10FT (ELECTRODE) ×3 IMPLANT
POSITIONER HEAD DONUT 9IN (MISCELLANEOUS) ×3 IMPLANT
SET SHEATH INTRODUCER 10FR (MISCELLANEOUS) IMPLANT
SHEATH COOK PEEL AWAY SET 9F (SHEATH) IMPLANT
SUT MNCRL AB 4-0 PS2 18 (SUTURE) ×5 IMPLANT
SUT VIC AB 3-0 SH 18 (SUTURE) ×5 IMPLANT
SYR 5ML LUER SLIP (SYRINGE) ×3 IMPLANT
TOWEL GREEN STERILE (TOWEL DISPOSABLE) ×3 IMPLANT
TOWEL GREEN STERILE FF (TOWEL DISPOSABLE) ×3 IMPLANT
TRAY LAPAROSCOPIC MC (CUSTOM PROCEDURE TRAY) ×3 IMPLANT

## 2019-07-09 NOTE — Anesthesia Preprocedure Evaluation (Addendum)
Anesthesia Evaluation  Patient identified by MRN, date of birth, ID band Patient awake    Reviewed: Allergy & Precautions, NPO status , Patient's Chart, lab work & pertinent test results  Airway Mallampati: II  TM Distance: >3 FB Neck ROM: Full    Dental no notable dental hx. (+) Dental Advisory Given, Teeth Intact   Pulmonary neg pulmonary ROS, former smoker,    Pulmonary exam normal breath sounds clear to auscultation       Cardiovascular hypertension, Normal cardiovascular exam Rhythm:Regular Rate:Normal     Neuro/Psych negative neurological ROS  negative psych ROS   GI/Hepatic Neg liver ROS, GERD  Medicated and Controlled,  Endo/Other  Morbid obesity  Renal/GU negative Renal ROS  negative genitourinary   Musculoskeletal negative musculoskeletal ROS (+)   Abdominal   Peds negative pediatric ROS (+)  Hematology negative hematology ROS (+)   Anesthesia Other Findings   Reproductive/Obstetrics negative OB ROS                            Anesthesia Physical Anesthesia Plan  ASA: II  Anesthesia Plan: General   Post-op Pain Management:    Induction: Intravenous  PONV Risk Score and Plan: 3 and Ondansetron, Dexamethasone, Midazolam and Treatment may vary due to age or medical condition  Airway Management Planned: LMA  Additional Equipment:   Intra-op Plan:   Post-operative Plan:   Informed Consent: I have reviewed the patients History and Physical, chart, labs and discussed the procedure including the risks, benefits and alternatives for the proposed anesthesia with the patient or authorized representative who has indicated his/her understanding and acceptance.     Dental advisory given  Plan Discussed with: CRNA  Anesthesia Plan Comments:         Anesthesia Quick Evaluation

## 2019-07-09 NOTE — Discharge Instructions (Signed)
[facility] - DISCHARGE INSTRUCTIONS [name]  Activity:  Driving - May drive tomorrow, if doing well and off pain meds   Lifting - No lifting more than 15 pounds for 5 days, then no limit                       Practice you Covid-19 protection:  Wear a mask, social distance, and wash your hands frequently  Wound Care:   Leave the incision dry for 2 days, then you may shower.         The port is accessed and can be used on Wednesday, 8/5.  Diet:  As tolerated  Follow up appointment:  Call Dr. Pollie Friar office [facility]) at [phone] for an appointment in 6 months..         We are doing "e" visits post op during this Covid-19 virus epidemic, our office will contact you about this arrangement.  If you have not heard from our office, call our office the day before your scheduled visit to make plans for your visit.  Medications and dosages:  Resume your home medications.  You have a prescription for:  Vicodin  Call Dr. [name] or his office  [phone]) if you have:  Temperature greater than 100.4,  Persistent nausea and vomiting,  Severe uncontrolled pain,  Redness, tenderness, or signs of infection (pain, swelling, redness, odor or green/yellow discharge around the site),  Difficulty breathing, headache or visual disturbances,  Any other questions or concerns you may have after discharge.  In an emergency, call 911 or go to an Emergency Department at a nearby hospital.

## 2019-07-09 NOTE — Anesthesia Procedure Notes (Signed)
Procedure Name: LMA Insertion Date/Time: 07/09/2019 7:42 AM Performed by: Harden Mo, CRNA Pre-anesthesia Checklist: Patient identified, Emergency Drugs available, Suction available and Patient being monitored Patient Re-evaluated:Patient Re-evaluated prior to induction Oxygen Delivery Method: Circle System Utilized Preoxygenation: Pre-oxygenation with 100% oxygen Induction Type: IV induction LMA: LMA inserted LMA Size: 4.0 Number of attempts: 1 Airway Equipment and Method: Bite block Placement Confirmation: positive ETCO2 Tube secured with: Tape Dental Injury: Teeth and Oropharynx as per pre-operative assessment

## 2019-07-09 NOTE — Interval H&P Note (Signed)
**Note Rebecca-Identified via Obfuscation** History and Physical Interval Note:  07/09/2019 7:26 AM  Rebecca [name]  has presented today for surgery, with the diagnosis of RECURRENT BREAST CANCER.  The various methods of treatment have been discussed with the patient and family.   Her mother is here with her.  After consideration of risks, benefits and other options for treatment, the patient has consented to  Procedure(s): INSERTION PORT-A-CATH WITH ULTRASOUND (N/A) as a surgical intervention.  The patient's history has been reviewed, patient examined, no change in status, stable for surgery.  I have reviewed the patient's chart and labs.  Questions were answered to the patient's satisfaction.     Shann Medal

## 2019-07-09 NOTE — Anesthesia Postprocedure Evaluation (Signed)
Anesthesia Post Note  Patient: Rebecca [name]  Procedure(s) Performed: INSERTION PORT-A-CATH WITH ULTRASOUND (N/A Breast)     Patient location during evaluation: PACU Anesthesia Type: General Level of consciousness: awake and alert Pain management: pain level controlled Vital Signs Assessment: post-procedure vital signs reviewed and stable Respiratory status: spontaneous breathing, nonlabored ventilation, respiratory function stable and patient connected to nasal cannula oxygen Cardiovascular status: blood pressure returned to baseline and stable Postop Assessment: no apparent nausea or vomiting Anesthetic complications: no    Last Vitals:  Vitals:   07/09/19 1020 07/09/19 1022  BP: 120/89   Pulse: 66 65  Resp: 15 18  Temp: 36.6 C   SpO2: 98% 97%    Last Pain:  Vitals:   07/09/19 1022  TempSrc:   PainSc: 0-No pain                 Montez Hageman

## 2019-07-09 NOTE — Progress Notes (Signed)
[name]  [phone] [phone]   ID: [name] DOB: May 07, 1976  MR#: [id number]  DZH#:[id number]  Patient Care Team: Dorena Dew, FNP as PCP - General (Family Medicine) Alphonsa Overall, MD as Consulting Physician (General Surgery) Ellorie Kindall, [name], MD as Consulting Physician (Oncology) Gery Pray, MD as Consulting Physician (Radiation Oncology) Delice Bison, Charlestine Massed, NP as Nurse Practitioner (Hematology and Oncology) Alda Berthold, DO as Consulting Physician (Neurology) OTHER MD:  CHIEF COMPLAINT: triple negative stage IV breast cancer  CURRENT TREATMENT: carboplatin/ gemcitabine; zoledronate   INTERVAL HISTORY: Rebecca [name] returns today for follow-up and treatment of her triple negative breast cancer.  She will begin carboplatin/gemcitabine today.  This will be repeated days 1 and 8 of each 21-day cycle, with restaging after 3 or 4 cycles depending on tolerance.   She will begin zolendronate 07/17/2019, with her second chemo dose.    Since her last visit here, she had a port placed on 07/09/2019. She had Norco for pain, but she has not had to use it.    REVIEW OF SYSTEMS: Rebecca [name] notes that she has had some anxiety regarding her treatment. She says that her family is not handling the news well overall. The patient denies unusual headaches, visual changes, nausea, vomiting, or dizziness. There has been no unusual cough, phlegm production, or pleurisy. This been no change in bowel or bladder habits. The patient denies unexplained fatigue or unexplained weight loss, bleeding, rash, or fever. A detailed review of systems was otherwise noncontributory.    BREAST CANCER HISTORY: From the original intake note:  Rebecca [name] herself noted a change in her right breast sometime around September or October 2016. She did not bring it to intermediate medical attention, but on 01/13/2016 she established herself in Dr. Smith Robert' service and she was set up  for bilateral diagnostic mammography with tomosynthesis and bilateral ultrasonography at the [facility] 01/19/2016. The breast density was category B. In the upper outer quadrant of the right breast there was a spiculated mass measuring 2.8 cm. On physical exam this was palpable. Targeted ultrasonography confirmed an irregular hypoechoic mass in the right breast 11:30 o'clock position measuring 2.6 cm maximally. Ultrasound of the right axilla showed a morphologically abnormal lymph node.  In the left breast there were some tubular densities behind the areola which by ultrasonography showed benign ductal ectasia.  On 01/28/2016 Rebecca [name] underwent biopsy of the right breast mass and abnormal right axillary lymph node. The pathology from this procedure (S [id number]) showed the lymph node to be benign. In the breast however there was an invasive ductal carcinoma, grade 3, which was estrogen and progesterone receptor negative. The proliferation marker was 70%. HER-2 was not amplified with a signals ratio of 1.32. The number per cell was 2.05.  The patient's subsequent history is as detailed below   PAST MEDICAL HISTORY: Past Medical History:  Diagnosis Date   Anxiety    Breast cancer ([place])    Depression    GERD (gastroesophageal reflux disease)    History of radiation therapy 11/15/16-01/12/17   right chest wall and axilla treated to 45 Gy in 25 fractions, boosted and additional 14 Gy in 8 fractions   Hypertension    diet controlled   Obesity (BMI 35.0-39.9 without comorbidity)    Pneumonia    as a child   Seasonal allergies    Sickle cell trait ([place])    Termination of pregnancy (fetus) 04/02/16    PAST SURGICAL HISTORY: Past Surgical History:  Procedure Laterality Date   CESAREAN SECTION     2004 and 2007   MASTECTOMY W/ SENTINEL NODE BIOPSY Right 09/06/2016   Procedure: RIGHT BREAST MASTECTOMY WITH RIGHT AXILLARY SENTINEL LYMPH NODE BIOPSY;  Surgeon: Alphonsa Overall, MD;   Location: [facility];  Service: General;  Laterality: Right;   PORT-A-CATH REMOVAL Left 09/06/2016   Procedure: REMOVAL PORT-A-CATH;  Surgeon: Alphonsa Overall, MD;  Location: [facility];  Service: General;  Laterality: Left;   PORTACATH PLACEMENT      FAMILY HISTORY Family History  Problem Relation Age of Onset   Hypertension Mother    Cancer Mother        dx "intestinal cancer" in her 25s; +surgery   Other Mother        hysterectomy at Rebecca age for unspecified cause   Heart Problems Mother    Breast cancer Cousin        maternal 1st cousin dx female breast cancer at 66-46y   Cancer Father    Hypertension Father    Heart Problems Maternal Aunt    Diabetes Maternal Aunt    Breast cancer Maternal Uncle        dx 64-65   Heart Problems Maternal Uncle    Breast cancer Maternal Grandmother 50   Throat cancer Maternal Grandfather        d. 30s; smoker   Sickle cell anemia Paternal Aunt    Congestive Heart Failure Maternal Aunt    Multiple sclerosis Cousin    Cancer Other        maternal great uncle (MGM's brother); cancer removed from his side   Heart attack Paternal Aunt        d. early 43s  The patient has very little information about her father. Her mother is currently 57 years old. She had a history of cervical cancer at age 33. The patient had 2 brothers, no sisters. The maternal grandfather had throat cancer. A maternal uncle was diagnosed with breast cancer as well as prostate cancer at the age of 58. 2 maternal cousins, one of the mail, had breast cancer as well.   GYNECOLOGIC HISTORY:  No LMP recorded. Menarche age 83, first live birth age 46. The patient is GX P4. She was still having regular periods at the time of diagnosis. She took oral contraceptives in the 1990s with no side effects.--.  Stopped with chemotherapy and have not resumed as of May 2019   SOCIAL HISTORY:  (Updated 10/2018) She works as a Market researcher.  The patient's significant other [name] works at [employer].  At home with the patient are her 3 children [name], [name] and [name]. There are age 72, 46, and 50 as of November 2019.  2 of them are disabled or have significant health problems, one with sickle cell disease, the other with autism and developmental delay.  The patient's son [name], currently 68 years old, lives in [place].    ADVANCED DIRECTIVES: Not in place   HEALTH MAINTENANCE: Social History   Socioeconomic History   Marital status: Single    Spouse name: Not on file   Number of children: 4   Years of education: Not on file   Highest education level: Not on file  Occupational History   Occupation: Private Care Attendant    Comment: [employer] [place] resource strain: Not on file   Food insecurity  Worry: Never true    Inability: Never true   Transportation needs    Medical: No    Non-medical: No  Tobacco Use   Smoking status: Former Smoker    Packs/day: 1.00    Years: 20.00    Pack years: 20.00    Types: Cigarettes    Quit date: 04/01/2018    Years since quitting: 1.2   Smokeless tobacco: Never Used   Tobacco comment: Patient has quit smoking x 1 year now  Substance and Sexual Activity   Alcohol use: Yes    Comment: occ   Drug use: No   Sexual activity: Not on file  Lifestyle   Physical activity    Days per week: 7 days    Minutes per session: 60 min   Stress: Not at all  Relationships   Social connections    Talks on phone: Not on file    Gets together: Not on file    Attends religious service: Not on file    Active member of club or organization: Not on file    Attends meetings of clubs or organizations: Not on file    Relationship status: Not on file  Other Topics Concern   Not on file  Social History Narrative   Not on file     Colonoscopy:  PAP:  Bone density:  Lipid panel:  No  Known Allergies  Current Outpatient Medications on File Prior to Visit  Medication Sig Dispense Refill   calcium carbonate (TUMS - DOSED IN MG ELEMENTAL CALCIUM) 500 MG chewable tablet Chew 1 tablet by mouth daily as needed for indigestion or heartburn.     dexamethasone (DECADRON) 4 MG tablet Take 2 tablets (8 mg total) by mouth daily. Start the day after chemotherapy for 2 days. Take with food. 30 tablet 1   gabapentin (NEURONTIN) 300 MG capsule Take 1 tablet at bedtime for one week, then increase to 1 tablet morning and bedtime. (Patient taking differently: Take 300 mg by mouth daily as needed (pain). ) 60 capsule 3   HYDROcodone-acetaminophen (NORCO/VICODIN) 5-325 MG tablet Take 1 tablet by mouth every 6 (six) hours as needed. 10 tablet 0   lidocaine-prilocaine (EMLA) cream Apply to affected area once 30 g 3   LORazepam (ATIVAN) 0.5 MG tablet Take 1 tablet (0.5 mg total) by mouth at bedtime as needed (Nausea or vomiting). 30 tablet 0   omeprazole (PRILOSEC) 20 MG capsule Take 1 capsule (20 mg total) by mouth daily. (Patient taking differently: Take 20 mg by mouth daily as needed (acid reflux). ) 30 capsule 1   prochlorperazine (COMPAZINE) 10 MG tablet Take 1 tablet (10 mg total) by mouth every 6 (six) hours as needed (Nausea or vomiting). 30 tablet 1   traMADol (ULTRAM) 50 MG tablet Take 1 tablet (50 mg total) by mouth every 6 (six) hours as needed. (Patient taking differently: Take 50 mg by mouth every 6 (six) hours as needed for severe pain. ) 30 tablet 0   No current facility-administered medications on file prior to visit.     OBJECTIVE: Rebecca [name] in no acute distress Vitals:   07/10/19 1045  BP: 132/88  Pulse: 79  Resp: 18  Temp: 98.3 F (36.8 C)  SpO2: 99%   Filed Weights   07/10/19 1045  Weight: 212 lb 8 oz (96.4 kg)   Body mass index is 39.5 kg/m.   ECOG FS: 1 - Symptomatic but completely ambulatory  Sclerae unicteric, EOMs intact Wearing a  mask No cervical or supraclavicular adenopathy Lungs no rales or rhonchi Heart regular rate and rhythm Abd soft, nontender, positive bowel sounds MSK no focal spinal tenderness, no upper extremity lymphedema Neuro: nonfocal, well oriented, appropriate affect Breasts: Status post right mastectomy.  The right chest wall is imaged below.  Left breast is unremarkable.  The port is intact and accessed   Right Chest Wall 07/02/2019:      LAB RESULTS: CMP Latest Ref Rng & Units 07/09/2019 06/04/2019 01/23/2019  Glucose 70 - 99 mg/dL 104(H) 96 85  BUN 6 - 20 mg/dL 12 8 12   Creatinine 0.44 - 1.00 mg/dL 0.77 0.82 1.14(H)  Sodium 135 - 145 mmol/L 141 143 141  Potassium 3.5 - 5.1 mmol/L 3.3(L) 3.7 3.9  Chloride 98 - 111 mmol/L 108 108 108  CO2 22 - 32 mmol/L 24 27 27   Calcium 8.9 - 10.3 mg/dL 9.3 9.2 9.2  Total Protein 6.5 - 8.1 g/dL - 7.9 7.8  Total Bilirubin 0.3 - 1.2 mg/dL - 0.2(L) 0.3  Alkaline Phos 38 - 126 U/L - 78 82  AST 15 - 41 U/L - 15 13(L)  ALT 0 - 44 U/L - 11 12     CBC    Component Value Date/Time   WBC 19.4 (H) 07/10/2019 1035   RBC 4.01 07/10/2019 1035   HGB 11.2 (L) 07/10/2019 1035   HGB 11.1 (L) 06/04/2019 1218   HGB 10.4 (L) 10/04/2016 1154   HCT 33.5 (L) 07/10/2019 1035   HCT 31.3 (L) 10/04/2016 1154   PLT 271 07/10/2019 1035   PLT 243 06/04/2019 1218   PLT 320 10/04/2016 1154   MCV 83.5 07/10/2019 1035   MCV 95.7 10/04/2016 1154   MCH 27.9 07/10/2019 1035   MCHC 33.4 07/10/2019 1035   RDW 14.6 07/10/2019 1035   RDW 16.3 (H) 10/04/2016 1154   LYMPHSABS 2.9 07/10/2019 1035   LYMPHSABS 1.7 10/04/2016 1154   MONOABS 1.3 (H) 07/10/2019 1035   MONOABS 0.5 10/04/2016 1154   EOSABS 0.0 07/10/2019 1035   EOSABS 0.3 10/04/2016 1154   BASOSABS 0.0 07/10/2019 1035   BASOSABS 0.0 10/04/2016 1154    STUDIES: Ct Chest W Contrast  Result Date: 06/11/2019 CLINICAL DATA:  Remote history of breast cancer with initial diagnosis 2016. Patient is having pain and  swelling in the right upper chest. EXAM: CT CHEST WITH CONTRAST TECHNIQUE: Multidetector CT imaging of the chest was performed during intravenous contrast administration. CONTRAST:  30m OMNIPAQUE IOHEXOL 300 MG/ML  SOLN COMPARISON:  Chest CT 05/19/2012 FINDINGS: Cardiovascular: The heart is normal in size. No pericardial effusion. The aorta is normal in caliber. No dissection. No atherosclerotic calcifications. A few scattered coronary artery calcifications are suspected. Mediastinum/Nodes: No mediastinal or hilar mass or adenopathy. There is some residual thymic tissue noted in the anterior mediastinum but I do not see any significant change since 2013. The esophagus is grossly normal.  The thyroid gland is unremarkable. Lungs/Pleura: No worrisome pulmonary lesions or pulmonary nodules to suggest metastatic disease. There are radiation changes involving the anterior aspect of the right lung. No pleural effusions. No pleural nodules. Upper Abdomen: No significant upper abdominal findings. I do not see any findings suspicious for upper abdominal metastatic disease. Musculoskeletal: There are surgical changes from a right mastectomy. There is also skin thickening and probable radiation changes. The lateral margin of the pectoralis major muscle is abnormally thickened and has almost a rounded configuration measuring up to 3 cm in diameter. This is very asymmetric  when compared to the other muscle. However, it could be related to surgery and radiation. Slightly more laterally in the axilla there is an area of nodularity with 2 small adjacent nodules or possibly lymph nodes measuring 8 mm each. The fat plane between the pectoralis major and minor muscle is also indistinct although I do not see a definite lesion in between them. No lower axillary lymph nodes. Along the inferior margin of the mastectomy scar along the lower margin of the pectoralis the anterior surface is very irregular and nodular in appearance. Again  this could be surgical and radiation changes. No bone lesions are identified to suggest osseous metastatic disease and I do not see any anterior rib destruction. IMPRESSION: 1. Surgical changes from a right mastectomy. Without a prior postmastectomy study for comparison it is difficult to know whether all these changes are postsurgical/post radiation or whether there could be recurrent tumor. I am most worried about the rounded masslike appearance of the lateral aspect of the pectoralis muscle. There is also mild nodularity in the right axilla and some nodularity along the anterior margin of the lower pectoralis muscle. Recommend correlation with physical examination. A PET-CT may be helpful to exclude any hypermetabolic recurrent tumor and to direct a potential biopsy. 2. No supraclavicular adenopathy and no findings suspicious for mediastinal or hilar disease or pulmonary metastasis. 3. No findings suspicious for osseous metastatic disease. Electronically Signed   By: Marijo Sanes M.D.   On: 06/11/2019 20:45   Nm Pet Image Initial (pi) Skull Base To Thigh  Result Date: 06/19/2019 CLINICAL DATA:  Initial treatment strategy for breast cancer. EXAM: NUCLEAR MEDICINE PET SKULL BASE TO THIGH TECHNIQUE: 10 mCi F-18 FDG was injected intravenously. Full-ring PET imaging was performed from the skull base to thigh after the radiotracer. CT data was obtained and used for attenuation correction and anatomic localization. Fasting blood glucose: 90 mg/dl COMPARISON:  CT chest 06/11/2019 FINDINGS: Mediastinal blood pool activity: SUV max 2.7 Liver activity: SUV max NA NECK: Nonspecific hypermetabolism noted in the nasopharynx and tonsillar regions. No hypermetabolic cervical lymphadenopathy. Incidental CT findings: none CHEST: 2.7 x 3.1 cm mass lesion in the right pectoralis major muscle is markedly hypermetabolic with SUV max = 30.0. 10 mm nodule/lymph node in the right axilla is hypermetabolic with SUV max = 76.2. Focal  hypermetabolism in the medial aspect of the right pectoralis major muscle demonstrates SUV max = 6.7. 1.1 cm right internal mammary node seen on 53/4 demonstrates SUV max = 6.7. 1.6 cm short axis left axillary node is hypermetabolic with SUV max = 26.3. Soft tissue attenuation in the inferior aspect of the mastectomy bed is hypermetabolic with SUV max = 33.5 Tiny 5 mm nodule in the inferior right axilla (45/6) is hypermetabolic with SUV max = 2.6. Incidental CT findings: No pericardial or pleural effusion no suspicious pulmonary nodule or mass. No pleural effusion. ABDOMEN/PELVIS: No abnormal hypermetabolic activity within the liver, pancreas, adrenal glands, or spleen. No hypermetabolic lymph nodes in the abdomen or pelvis. Liver activity is mottled, but no definite hypermetabolic metastatic lesion. Incidental CT findings: none SKELETON: Mottled FDG accumulation is identified in the marrow of the thoracolumbar spine. This may be related to stimulatory effects of therapy, but bony involvement by metastatic disease cannot be entirely excluded. Incidental CT findings: No lytic or sclerotic osseous lesions evident by CT. IMPRESSION: 1. Multifocal hypermetabolic disease in the right anterior chest wall in this patient status post right mastectomy. Dominant lesion is a 3.1 cm mass  in the right pectoralis major muscle. 2. Hypermetabolic right internal mammary lymph nodes with hypermetabolic nodes in the right axilla. 3. Large hypermetabolic nodal metastasis in the left axilla. 4. No definite hypermetabolic metastatic disease in the neck, abdomen, or pelvis. 5. Mottled diffuse marrow uptake is presumably related to marrow response to therapy although bony metastatic disease could have this appearance. Electronically Signed   By: Misty Stanley M.D.   On: 06/19/2019 16:03   Dg Chest Port 1 View  Result Date: 07/09/2019 CLINICAL DATA:  Status post Port-A-Cath placement today. EXAM: PORTABLE CHEST 1 VIEW COMPARISON:  PA and  lateral chest 05/19/2012. FINDINGS: Port-A-Cath from a left subclavian approach is in place. The tip projects in the right atrium. Lungs are clear. No pneumothorax. Heart size is normal. No acute bony abnormality. IMPRESSION: Port-A-Cath tip projects in the right atrium. Negative for pneumothorax. Electronically Signed   By: Inge Rise M.D.   On: 07/09/2019 09:07   Dg Fluoro Guide Cv Line-no Report  Result Date: 07/09/2019 Fluoroscopy was utilized by the requesting physician.  No radiographic interpretation.   US Breast Ltd Uni Right Inc Axilla  Result Date: 06/27/2019 CLINICAL DATA:  43 year old female status post right mastectomy in 2017. Patient had a recent PET scan on 06/19/2019 which demonstrated a hypermetabolic anterior chest wall mass at the mastectomy site originating from the pectoralis major muscle. Additionally, hypermetabolic lymphadenopathy was seen in the bilateral axilla. The patient presents today for additional diagnostic workup. EXAM: DIGITAL DIAGNOSTIC LEFT MAMMOGRAM WITH CAD AND TOMO ULTRASOUND BILATERAL AXILLA COMPARISON:  Previous exam(s). ACR Breast Density Category b: There are scattered areas of fibroglandular density. FINDINGS: No suspicious mammographic findings identified within left breast. The parenchymal pattern stable. An enlarged lymph node is partially visualized in the left axilla. Mammographic images were processed with CAD. Targeted ultrasound is performed, showing a bulky, heterogenous mass within the right upper outer quadrant of the right mastectomy bed. Exact measurements are difficult due to size, but it measures approximately 5.1 x 4.3 x 2.5 cm. Note is made of associated vascularity. This correlates with the PET CT findings. Additionally, two adjacent, morphologically abnormal lymph nodes with complete effacement of the fatty hila are noted in the right axilla. The larger one measures approximately 10 x 10 x 8 mm and likely corresponds with the PET-CT  findings. Within the left axilla, there is an enlarged, morphologically abnormal lymph node with complete obliteration of the fatty hilum. It measures up to 1.4 cm in short axis dimension. IMPRESSION: 1. Suspicious right upper outer quadrant mastectomy bed mass corresponding with recent PET-CT findings. Recommendation is for ultrasound-guided biopsy. 2. Suspicious bilateral axillary lymphadenopathy corresponding with PET-CT findings. Recommendation is for ultrasound-guided biopsy. 3. No mammographic evidence of malignancy within the left breast. RECOMMENDATION: Three area ultrasound-guided biopsy to include right mastectomy bed mass and bilateral axillary lymph nodes. This has been scheduled for 06/28/2019 at 8:30 a.m. I have discussed the findings and recommendations with the patient. Results were also provided in writing at the conclusion of the visit. If applicable, a reminder letter will be sent to the patient regarding the next appointment. BI-RADS CATEGORY  5: Highly suggestive of malignancy. Electronically Signed   By: Kristopher Oppenheim M.D.   On: 06/27/2019 14:35   Mm Diag Breast Tomo Uni Left  Result Date: 06/27/2019 CLINICAL DATA:  43 year old female status post right mastectomy in 2017. Patient had a recent PET scan on 06/19/2019 which demonstrated a hypermetabolic anterior chest wall mass at the mastectomy site  originating from the pectoralis major muscle. Additionally, hypermetabolic lymphadenopathy was seen in the bilateral axilla. The patient presents today for additional diagnostic workup. EXAM: DIGITAL DIAGNOSTIC LEFT MAMMOGRAM WITH CAD AND TOMO ULTRASOUND BILATERAL AXILLA COMPARISON:  Previous exam(s). ACR Breast Density Category b: There are scattered areas of fibroglandular density. FINDINGS: No suspicious mammographic findings identified within left breast. The parenchymal pattern stable. An enlarged lymph node is partially visualized in the left axilla. Mammographic images were processed with  CAD. Targeted ultrasound is performed, showing a bulky, heterogenous mass within the right upper outer quadrant of the right mastectomy bed. Exact measurements are difficult due to size, but it measures approximately 5.1 x 4.3 x 2.5 cm. Note is made of associated vascularity. This correlates with the PET CT findings. Additionally, two adjacent, morphologically abnormal lymph nodes with complete effacement of the fatty hila are noted in the right axilla. The larger one measures approximately 10 x 10 x 8 mm and likely corresponds with the PET-CT findings. Within the left axilla, there is an enlarged, morphologically abnormal lymph node with complete obliteration of the fatty hilum. It measures up to 1.4 cm in short axis dimension. IMPRESSION: 1. Suspicious right upper outer quadrant mastectomy bed mass corresponding with recent PET-CT findings. Recommendation is for ultrasound-guided biopsy. 2. Suspicious bilateral axillary lymphadenopathy corresponding with PET-CT findings. Recommendation is for ultrasound-guided biopsy. 3. No mammographic evidence of malignancy within the left breast. RECOMMENDATION: Three area ultrasound-guided biopsy to include right mastectomy bed mass and bilateral axillary lymph nodes. This has been scheduled for 06/28/2019 at 8:30 a.m. I have discussed the findings and recommendations with the patient. Results were also provided in writing at the conclusion of the visit. If applicable, a reminder letter will be sent to the patient regarding the next appointment. BI-RADS CATEGORY  5: Highly suggestive of malignancy. Electronically Signed   By: Kristopher Oppenheim M.D.   On: 06/27/2019 14:35   [place] Axillary Node Core Biopsy Left  Addendum Date: 07/01/2019   ADDENDUM REPORT: 07/01/2019 11:17 ADDENDUM: Pathology revealed GRADE III INVASIVE DUCTAL CARCINOMA of the RIGHT mastectomy bed, upper outer, palpable chest wall mass. This was found to be concordant by Dr. Curlene Dolphin. Pathology revealed  INVASIVE DUCTAL CARCINOMA of the RIGHT axillary lymph node. There is no distinct nodal tissue identified. This was found to be concordant by Dr. Curlene Dolphin. Pathology revealed INVASIVE DUCTAL CARCINOMA of the LEFT axillary lymph node. There is no distinct nodal tissue identified. This was found to be concordant by Dr. Curlene Dolphin. Pathology results were discussed with the patient by telephone. The patient reported doing well after the biopsies with tenderness at the sites. Post biopsy instructions and care were reviewed and questions were answered. The patient was encouraged to call [facility] for any additional concerns. Surgical consultation has been arranged with Dr. Alphonsa Overall, per patient request, at [facility] on July 11, 2019. The patient is scheduled to see Dr. Tressa Busman at [facility] on July 02, 2019. Pathology results were called to Bary Castilla, RN, Nurse Navigator, at [facility] on July 01, 2019. Pathology results reported by Terie Purser, RN on 07/01/2019. Electronically Signed   By: Curlene Dolphin M.D.   On: 07/01/2019 11:17   Result Date: 07/01/2019 CLINICAL DATA:  Ultrasound-guided core needle biopsy was recommended a suspicious left axillary lymph node. EXAM: [place] AXILLARY NODE CORE BIOPSY LEFT COMPARISON:  Previous exam(s). FINDINGS: I met with the patient and we  discussed the procedure of ultrasound-guided biopsy, including benefits and alternatives. We discussed the high likelihood of a successful procedure. We discussed the risks of the procedure, including infection, bleeding, tissue injury, clip migration, and inadequate sampling. Informed written consent was given. The usual time-out protocol was performed immediately prior to the procedure. Using sterile technique and 1% Lidocaine as local anesthetic, under direct ultrasound visualization, a 14 gauge spring-loaded device was used to perform biopsy of a  suspicious left axillary lymph node using a lateral approach. At the conclusion of the procedure a HydroMARK tissue marker clip was deployed into the biopsy cavity. IMPRESSION: Ultrasound guided biopsy of left axillary lymph node. No apparent complications. Electronically Signed: By: Curlene Dolphin M.D. On: 06/28/2019 10:19   [place] Axillary Node Core Biopsy Right  Addendum Date: 07/01/2019   ADDENDUM REPORT: 07/01/2019 11:17 ADDENDUM: Pathology revealed GRADE III INVASIVE DUCTAL CARCINOMA of the RIGHT mastectomy bed, upper outer, palpable chest wall mass. This was found to be concordant by Dr. Curlene Dolphin. Pathology revealed INVASIVE DUCTAL CARCINOMA of the RIGHT axillary lymph node. There is no distinct nodal tissue identified. This was found to be concordant by Dr. Curlene Dolphin. Pathology revealed INVASIVE DUCTAL CARCINOMA of the LEFT axillary lymph node. There is no distinct nodal tissue identified. This was found to be concordant by Dr. Curlene Dolphin. Pathology results were discussed with the patient by telephone. The patient reported doing well after the biopsies with tenderness at the sites. Post biopsy instructions and care were reviewed and questions were answered. The patient was encouraged to call [facility] [place] for any additional concerns. Surgical consultation has been arranged with Dr. Alphonsa Overall, per patient request, at [facility] on July 11, 2019. The patient is scheduled to see Dr. Tressa Busman at [facility] on July 02, 2019. Pathology results were called to Bary Castilla, RN, Nurse Navigator, at [facility] on July 01, 2019. Pathology results reported by Terie Purser, RN on 07/01/2019. Electronically Signed   By: Curlene Dolphin M.D.   On: 07/01/2019 11:17   Result Date: 07/01/2019 CLINICAL DATA:  Ultrasound-guided core needle biopsy was recommended of a suspicious right axillary lymph node. EXAM: [place] AXILLARY NODE CORE  BIOPSY RIGHT COMPARISON:  Previous exam(s). FINDINGS: I met with the patient and we discussed the procedure of ultrasound-guided biopsy, including benefits and alternatives. We discussed the high likelihood of a successful procedure. We discussed the risks of the procedure, including infection, bleeding, tissue injury, clip migration, and inadequate sampling. Informed written consent was given. The usual time-out protocol was performed immediately prior to the procedure. Using sterile technique and 1% Lidocaine with and without epinephrine as local anesthetic, under direct ultrasound visualization, a 14 gauge spring-loaded device was used to perform biopsy of a suspicious right axillary lymph node using a lateral approach. At the conclusion of the procedure a HydroMARK tissue marker clip was deployed into the biopsy cavity. IMPRESSION: Ultrasound guided biopsy of right axillary lymph node. No apparent complications. Electronically Signed: By: Curlene Dolphin M.D. On: 06/28/2019 10:18   [place] Axilla Left  Result Date: 06/27/2019 CLINICAL DATA:  43 year old female status post right mastectomy in 2017. Patient had a recent PET scan on 06/19/2019 which demonstrated a hypermetabolic anterior chest wall mass at the mastectomy site originating from the pectoralis major muscle. Additionally, hypermetabolic lymphadenopathy was seen in the bilateral axilla. The patient presents today for additional diagnostic workup. EXAM: DIGITAL DIAGNOSTIC LEFT MAMMOGRAM WITH CAD AND  TOMO ULTRASOUND BILATERAL AXILLA COMPARISON:  Previous exam(s). ACR Breast Density Category b: There are scattered areas of fibroglandular density. FINDINGS: No suspicious mammographic findings identified within left breast. The parenchymal pattern stable. An enlarged lymph node is partially visualized in the left axilla. Mammographic images were processed with CAD. Targeted ultrasound is performed, showing a bulky, heterogenous mass within the right upper outer  quadrant of the right mastectomy bed. Exact measurements are difficult due to size, but it measures approximately 5.1 x 4.3 x 2.5 cm. Note is made of associated vascularity. This correlates with the PET CT findings. Additionally, two adjacent, morphologically abnormal lymph nodes with complete effacement of the fatty hila are noted in the right axilla. The larger one measures approximately 10 x 10 x 8 mm and likely corresponds with the PET-CT findings. Within the left axilla, there is an enlarged, morphologically abnormal lymph node with complete obliteration of the fatty hilum. It measures up to 1.4 cm in short axis dimension. IMPRESSION: 1. Suspicious right upper outer quadrant mastectomy bed mass corresponding with recent PET-CT findings. Recommendation is for ultrasound-guided biopsy. 2. Suspicious bilateral axillary lymphadenopathy corresponding with PET-CT findings. Recommendation is for ultrasound-guided biopsy. 3. No mammographic evidence of malignancy within the left breast. RECOMMENDATION: Three area ultrasound-guided biopsy to include right mastectomy bed mass and bilateral axillary lymph nodes. This has been scheduled for 06/28/2019 at 8:30 a.m. I have discussed the findings and recommendations with the patient. Results were also provided in writing at the conclusion of the visit. If applicable, a reminder letter will be sent to the patient regarding the next appointment. BI-RADS CATEGORY  5: Highly suggestive of malignancy. Electronically Signed   By: Kristopher Oppenheim M.D.   On: 06/27/2019 14:35   [place] Rt Breast Bx W Loc Dev 1st Lesion Img Bx Spec US Guide  Addendum Date: 07/01/2019   ADDENDUM REPORT: 07/01/2019 11:17 ADDENDUM: Pathology revealed GRADE III INVASIVE DUCTAL CARCINOMA of the RIGHT mastectomy bed, upper outer, palpable chest wall mass. This was found to be concordant by Dr. Curlene Dolphin. Pathology revealed INVASIVE DUCTAL CARCINOMA of the RIGHT axillary lymph node. There is no distinct  nodal tissue identified. This was found to be concordant by Dr. Curlene Dolphin. Pathology revealed INVASIVE DUCTAL CARCINOMA of the LEFT axillary lymph node. There is no distinct nodal tissue identified. This was found to be concordant by Dr. Curlene Dolphin. Pathology results were discussed with the patient by telephone. The patient reported doing well after the biopsies with tenderness at the sites. Post biopsy instructions and care were reviewed and questions were answered. The patient was encouraged to call [facility] [place] for any additional concerns. Surgical consultation has been arranged with Dr. Alphonsa Overall, per patient request, at [facility] on July 11, 2019. The patient is scheduled to see Dr. Tressa Busman at [facility] on July 02, 2019. Pathology results were called to Bary Castilla, RN, Nurse Navigator, at [facility] on July 01, 2019. Pathology results reported by Terie Purser, RN on 07/01/2019. Electronically Signed   By: Curlene Dolphin M.D.   On: 07/01/2019 11:17   Result Date: 07/01/2019 CLINICAL DATA:  Ultrasound-guided core needle biopsy was recommended of a suspicious palpable mass right mastectomy bed upper outer quadrant. EXAM: ULTRASOUND GUIDED RIGHT BREAST (MASTECTOMY) CORE NEEDLE BIOPSY COMPARISON:  Previous exam(s). FINDINGS: I met with the patient and we discussed the procedure of ultrasound-guided biopsy, including benefits and alternatives. We discussed the high likelihood of  a successful procedure. We discussed the risks of the procedure, including infection, bleeding, tissue injury, clip migration, and inadequate sampling. Informed written consent was given. The usual time-out protocol was performed immediately prior to the procedure. Lesion quadrant: Upper outer quadrant Using sterile technique and 1% Lidocaine with and without epinephrine as local anesthetic, under direct ultrasound visualization, a 14 gauge  spring-loaded device was used to perform biopsy of the mass using a lateral approach. At the conclusion of the procedure a HydroMARK tissue marker clip was deployed into the biopsy cavity. IMPRESSION: Ultrasound guided biopsy of the right mastectomy bed. No apparent complications. Electronically Signed: By: Curlene Dolphin M.D. On: 06/28/2019 10:13    RESEARCH: Referred to PREVENT study, but was pregnant at the time; referred to [facility] 11202, but the biopsied lymph node was benign; referred to weight loss study but declined; referred to [facility] 81 12/09/2000, but the timing of radiation and 8 was greater than 60 days past the date of diagnosis; referred to health disparity study, but declined; referred to [id number] adjuvant therapy study, but the patient received Xeloda with radiation and therefore was ineligible  ASSESSMENT: 43 y.o. Rebecca [name] status post right breast upper-outer quadrant biopsy 01/28/2016 for a clinical T2 N0 invasive ductal carcinoma, grade 3, triple negative, with an MIB-1 of 70%.  (a) suspicious right axillary lymph node biopsied 01/28/2016 was benign  (1) neoadjuvant chemotherapy: doxorubicin and cyclophosphamide in dose dense fashion 4 started 04/14/16, completed 05/26/2016, followed by paclitaxel and carboplatin weekly 12, Started 06/09/2016  (a) taxol discontinued after 7 doses because of neuropathy, last dose 07/21/2016  (2) genetics testing October 20, 2016 through the 32-gene Comprehensive Cancer Panel offered by [facility] Junius Roads, MD) (with MSH2 Exons 1-7 Inversion Analysis) found no deleterious mutations or VUSS  In APC, ATM, AXIN2, BARD1, BMPR1A, BRCA1, BRCA2, BRIP1, CDH1, CDK4, CDKN2A, CHEK2, EPCAM, FANCC, MLH1, MSH2, MSH6, MUTYH, NBN, PALB2, PMS2, POLD1, POLE, PTEN, RAD51C, RAD51D, SCG5/GREM1, SMAD4, STK11, TP53, VHL, and XRCC2.    (3) right mastectomy and sentinel lymph node sampling 09/06/2016 showed a residual ypT1c ypN0, invasive ductal  carcinoma, grade 3, with negative margins. Repeat prognostic panel again triple negative   (4) adjuvant radiation with capecitabine/Xeloda sensitization 11/15/16 - 01/12/17 Site/dose:   Right Chest Wall and axilla (4 field) treated to 45 Gy in 25 fractions, and then Boosted an additional 14.4 Gy in 8 fractions.  (5) tobacco abuse disorder: The patient quit smoking 04/04/2018  METASTATIC DISEASE:  July 2020 (6) nonspecific changes noted on chest CT 06/11/2019 were clarified by PET scan 06/19/2019 showing hypermetabolic disease in the right anterior chest wall, right internal mammary nodes, right and left axillary nodes, but no metastatic disease in the neck, lungs, abdomen or pelvis.  Bone marrow uptake suggests bony metastatic disease.  (a) CARIS requested 07/03/19  (b) CA-27-29 is informative: was 118.3 on 06/04/2019  (7) zoledronate to start 07/17/2019  (8) carboplatin/ gemcitabine days 1 and 8 Q21 day cycle started 07/10/2019   PLAN: Rebecca [name] did well with port placement and is ready to start her chemotherapy today.  She has a good understanding of the possible toxicities side effects and complications.  She has not yet picked up her nausea medicines but I urged her to get that done and I wrote down how she should take them, namely dexamethasone 1 tablet twice daily for at least 2 days and Compazine tonight and tomorrow morning and then as needed up to 3 times daily.  She will use the EMLA  cream next time.  This time the port was left accessed.  She understands that the cream needs to be protected with some plastic wrap so it does not dry off without benefit  Even though she was not emotional at the visit today she describes herself as having down thoughts and unable to stop thinking.  She tells me her family is doing a lot of crying.  I think it would be beneficial if she started venlafaxine.  We discussed that at length today and I wrote a prescription for her to start at 75 mg.  Likely we  will up the dose 250 mg at some point.  She will see me again in 1 week.  I asked her to keep notes on any side effects that she experiences so we can troubleshoot them better.  Next week she will also receive her first dose of zoledronate  She knows to call for any other problem that may develop before the next visit.    [name]. Jasiyah Poland, MD  07/10/19 11:08 AM Medical Oncology and Hematology [facility] [address] [place], [place] [address] Tel. [phone]    Fax. [phone]   I, Jacqualyn Posey am acting as a Education administrator for Chauncey Cruel, MD.   I, Lurline Del MD, have reviewed the above documentation for accuracy and completeness, and I agree with the above.

## 2019-07-09 NOTE — Transfer of Care (Signed)
Immediate Anesthesia Transfer of Care Note  Patient: Rebecca [name]  Procedure(s) Performed: INSERTION PORT-A-CATH WITH ULTRASOUND (N/A Breast)  Patient Location: PACU  Anesthesia Type:General  Level of Consciousness: awake, alert  and oriented  Airway & Oxygen Therapy: Patient Spontanous Breathing  Post-op Assessment: Report given to RN, Post -op Vital signs reviewed and stable and Patient moving all extremities X 4  Post vital signs: Reviewed and stable  Last Vitals:  Vitals Value Taken Time  BP    Temp    Pulse    Resp    SpO2      Last Pain:  Vitals:   07/09/19 0600  TempSrc:   PainSc: 2       Patients Stated Pain Goal: 5 (12/06/70 5366)  Complications: No apparent anesthesia complications

## 2019-07-09 NOTE — Op Note (Signed)
07/09/2019  8:43 AM  PATIENT:  Rebecca [name], 43 y.o., female MRN: [id number] DOB: 05-29-1976  PREOP DIAGNOSIS:  RECURRENT Right BREAST CANCER, anticipate chemotherapy  POSTOP DIAGNOSIS:   Recurrent right breast cancer, anticipate chemotherapy  PROCEDURE:   Procedure(s): INSERTION Power port, left subclavian vein  SURGEON:   Alphonsa Overall, M.D.  ANESTHESIA:   general  Anesthesiologist: Montez Hageman, MD CRNA: Harden Mo, CRNA  General  EBL:  minimal  ml  COUNTS CORRECT:  YES  INDICATIONS FOR PROCEDURE:  [name] is a 43 y.o. (DOB: [birth year]) AA female whose primary care physician is Dorena Dew, FNP and comes for power port placement for the treatment of recurrent right breast cancer.  Dr. [name] is her treating oncologist.   The indications and risks of the surgery were explained to the patient.  The risks include, but are not limited to, infection, bleeding, pneumothorax, nerve injury, and thrombosis of the vein.  OPERATIVE NOTE:  The patient was taken to OR room #4 at [facility].  Anesthesia was provided by Anesthesiologist: Montez Hageman, MD CRNA: Harden Mo, CRNA.  At the beginning of the operation, the patient was given 2 gm Ancef, had a roll placed under her back, and had the upper chest/neck prepped with Chloroprep and draped.   A time out was held and the surgery checklist reviewed.   The patient was placed in Trendelenburg position.  The left subclavian vein was accessed with a 16 gauge needle and a guide wire threaded through the needle into the vein.  The position of the wire was checked with fluoroscopy.   I then developed a pocket in the upper inner aspect of the left chest for the port reservoir.  I used the [employer] for venous access.  The reservoir was sewn in place with a 3-0 Vicryl suture.  The reservoir had been flushed with dilute (10 units/cc) heparin.   I then passed the silastic tubing from the  reservoir incision to the subclavian stick site and used the 8 French introducer to pass it into the vein.  The tip of the silastic catheter was position at the junction of the SVC and the right atrium under fluoroscopy.  The silastic catheter was then attached to the port with the bayonet device.     The entire port and tubing were checked with fluoroscopy and then the port was flushed with dilute heparin (10 units/cc).I injected 15 cc of 1/4% marcaine in the wounds.   The wounds were then closed with 3-0 vicryl subcutaneous sutures and the skin closed with a 4-0 Monocryl suture.  The skin was painted with tincture of benzoin and Steristrips applied.   The port was then access with a right angle Huber needle and left accessed.   The tubing was flushed with 4 cc of concentrated heparin (100 units/cc).  She is to start chemotherapy tomorrow.   The patient was transferred to the recovery room in good condition.  The sponge and needle count were correct at the end of the case.  A CXR is ordered for port placement and pending at the time of this note.  Alphonsa Overall, MD, [facility] Surgery Pager: [phone] Office phone:  [phone]

## 2019-07-10 ENCOUNTER — Encounter (HOSPITAL_COMMUNITY): Payer: Self-pay | Admitting: Surgery

## 2019-07-10 ENCOUNTER — Inpatient Hospital Stay (HOSPITAL_BASED_OUTPATIENT_CLINIC_OR_DEPARTMENT_OTHER): Payer: Medicaid Other | Admitting: Oncology

## 2019-07-10 ENCOUNTER — Inpatient Hospital Stay: Payer: Medicaid Other | Attending: Oncology

## 2019-07-10 ENCOUNTER — Inpatient Hospital Stay: Payer: Medicaid Other

## 2019-07-10 ENCOUNTER — Other Ambulatory Visit: Payer: Self-pay

## 2019-07-10 VITALS — BP 132/88 | HR 79 | Temp 98.3°F | Resp 18 | Ht 61.5 in | Wt 212.5 lb

## 2019-07-10 DIAGNOSIS — I10 Essential (primary) hypertension: Secondary | ICD-10-CM | POA: Diagnosis not present

## 2019-07-10 DIAGNOSIS — Z79899 Other long term (current) drug therapy: Secondary | ICD-10-CM | POA: Insufficient documentation

## 2019-07-10 DIAGNOSIS — Z171 Estrogen receptor negative status [ER-]: Secondary | ICD-10-CM | POA: Diagnosis not present

## 2019-07-10 DIAGNOSIS — Z808 Family history of malignant neoplasm of other organs or systems: Secondary | ICD-10-CM | POA: Diagnosis not present

## 2019-07-10 DIAGNOSIS — Z9011 Acquired absence of right breast and nipple: Secondary | ICD-10-CM | POA: Diagnosis not present

## 2019-07-10 DIAGNOSIS — Z803 Family history of malignant neoplasm of breast: Secondary | ICD-10-CM | POA: Diagnosis not present

## 2019-07-10 DIAGNOSIS — G620 Drug-induced polyneuropathy: Secondary | ICD-10-CM | POA: Diagnosis not present

## 2019-07-10 DIAGNOSIS — C50411 Malignant neoplasm of upper-outer quadrant of right female breast: Secondary | ICD-10-CM

## 2019-07-10 DIAGNOSIS — D573 Sickle-cell trait: Secondary | ICD-10-CM | POA: Diagnosis not present

## 2019-07-10 DIAGNOSIS — C50919 Malignant neoplasm of unspecified site of unspecified female breast: Secondary | ICD-10-CM

## 2019-07-10 DIAGNOSIS — C773 Secondary and unspecified malignant neoplasm of axilla and upper limb lymph nodes: Secondary | ICD-10-CM | POA: Diagnosis not present

## 2019-07-10 DIAGNOSIS — R590 Localized enlarged lymph nodes: Secondary | ICD-10-CM | POA: Insufficient documentation

## 2019-07-10 DIAGNOSIS — T451X5A Adverse effect of antineoplastic and immunosuppressive drugs, initial encounter: Secondary | ICD-10-CM

## 2019-07-10 DIAGNOSIS — R922 Inconclusive mammogram: Secondary | ICD-10-CM | POA: Insufficient documentation

## 2019-07-10 DIAGNOSIS — Z6841 Body Mass Index (BMI) 40.0 and over, adult: Secondary | ICD-10-CM

## 2019-07-10 DIAGNOSIS — Z5111 Encounter for antineoplastic chemotherapy: Secondary | ICD-10-CM | POA: Insufficient documentation

## 2019-07-10 DIAGNOSIS — G629 Polyneuropathy, unspecified: Secondary | ICD-10-CM | POA: Insufficient documentation

## 2019-07-10 DIAGNOSIS — K219 Gastro-esophageal reflux disease without esophagitis: Secondary | ICD-10-CM | POA: Diagnosis not present

## 2019-07-10 DIAGNOSIS — F419 Anxiety disorder, unspecified: Secondary | ICD-10-CM | POA: Diagnosis not present

## 2019-07-10 DIAGNOSIS — Z87891 Personal history of nicotine dependence: Secondary | ICD-10-CM | POA: Diagnosis not present

## 2019-07-10 DIAGNOSIS — Z95828 Presence of other vascular implants and grafts: Secondary | ICD-10-CM

## 2019-07-10 DIAGNOSIS — Z8042 Family history of malignant neoplasm of prostate: Secondary | ICD-10-CM | POA: Diagnosis not present

## 2019-07-10 DIAGNOSIS — Z801 Family history of malignant neoplasm of trachea, bronchus and lung: Secondary | ICD-10-CM | POA: Insufficient documentation

## 2019-07-10 DIAGNOSIS — E669 Obesity, unspecified: Secondary | ICD-10-CM | POA: Diagnosis not present

## 2019-07-10 DIAGNOSIS — F418 Other specified anxiety disorders: Secondary | ICD-10-CM | POA: Insufficient documentation

## 2019-07-10 DIAGNOSIS — C7951 Secondary malignant neoplasm of bone: Secondary | ICD-10-CM

## 2019-07-10 DIAGNOSIS — Z1379 Encounter for other screening for genetic and chromosomal anomalies: Secondary | ICD-10-CM

## 2019-07-10 LAB — COMPREHENSIVE METABOLIC PANEL
ALT: 10 U/L (ref 0–44)
AST: 13 U/L — ABNORMAL LOW (ref 15–41)
Albumin: 4.1 g/dL (ref 3.5–5.0)
Alkaline Phosphatase: 74 U/L (ref 38–126)
Anion gap: 10 (ref 5–15)
BUN: 12 mg/dL (ref 6–20)
CO2: 26 mmol/L (ref 22–32)
Calcium: 9.8 mg/dL (ref 8.9–10.3)
Chloride: 107 mmol/L (ref 98–111)
Creatinine, Ser: 0.84 mg/dL (ref 0.44–1.00)
GFR calc Af Amer: >60 mL/min (ref >60)
GFR calc non Af Amer: >60 mL/min (ref >60)
Glucose, Bld: 94 mg/dL (ref 70–99)
Potassium: 3.8 mmol/L (ref 3.5–5.1)
Sodium: 143 mmol/L (ref 135–145)
Total Bilirubin: 0.3 mg/dL (ref 0.3–1.2)
Total Protein: 8.1 g/dL (ref 6.5–8.1)

## 2019-07-10 LAB — CBC WITH DIFFERENTIAL/PLATELET
Abs Immature Granulocytes: 0.08 10*3/uL — ABNORMAL HIGH (ref 0.00–0.07)
Basophils Absolute: 0 10*3/uL (ref 0.0–0.1)
Basophils Relative: 0 %
Eosinophils Absolute: 0 10*3/uL (ref 0.0–0.5)
Eosinophils Relative: 0 %
HCT: 33.5 % — ABNORMAL LOW (ref 36.0–46.0)
Hemoglobin: 11.2 g/dL — ABNORMAL LOW (ref 12.0–15.0)
Immature Granulocytes: 0 %
Lymphocytes Relative: 15 %
Lymphs Abs: 2.9 10*3/uL (ref 0.7–4.0)
MCH: 27.9 pg (ref 26.0–34.0)
MCHC: 33.4 g/dL (ref 30.0–36.0)
MCV: 83.5 fL (ref 80.0–100.0)
Monocytes Absolute: 1.3 10*3/uL — ABNORMAL HIGH (ref 0.1–1.0)
Monocytes Relative: 6 %
Neutro Abs: 15.2 10*3/uL — ABNORMAL HIGH (ref 1.7–7.7)
Neutrophils Relative %: 79 %
Platelets: 271 10*3/uL (ref 150–400)
RBC: 4.01 MIL/uL (ref 3.87–5.11)
RDW: 14.6 % (ref 11.5–15.5)
WBC: 19.4 10*3/uL — ABNORMAL HIGH (ref 4.0–10.5)
nRBC: 0 % (ref 0.0–0.2)

## 2019-07-10 MED ORDER — PALONOSETRON HCL INJECTION 0.25 MG/5ML
INTRAVENOUS | Status: AC
  Filled 2019-07-10: qty 5

## 2019-07-10 MED ORDER — DEXAMETHASONE SODIUM PHOSPHATE 10 MG/ML IJ SOLN
10.0000 mg | Freq: Once | INTRAMUSCULAR | Status: AC
  Administered 2019-07-10: 12:00:00 10 mg via INTRAVENOUS

## 2019-07-10 MED ORDER — PALONOSETRON HCL INJECTION 0.25 MG/5ML
0.2500 mg | Freq: Once | INTRAVENOUS | Status: AC
  Administered 2019-07-10: 12:00:00 0.25 mg via INTRAVENOUS

## 2019-07-10 MED ORDER — SODIUM CHLORIDE 0.9% FLUSH
10.0000 mL | INTRAVENOUS | Status: DC | PRN
  Administered 2019-07-10: 11:00:00 10 mL via INTRAVENOUS
  Filled 2019-07-10: qty 10

## 2019-07-10 MED ORDER — VENLAFAXINE HCL ER 75 MG PO CP24: 75.0000 mg | ORAL_CAPSULE | Freq: Every day | ORAL | 12 refills | Status: DC

## 2019-07-10 MED ORDER — SODIUM CHLORIDE 0.9 % IV SOLN
1000.0000 mg/m2 | Freq: Once | INTRAVENOUS | Status: AC
  Administered 2019-07-10: 2052 mg via INTRAVENOUS
  Filled 2019-07-10: qty 53.97

## 2019-07-10 MED ORDER — SODIUM CHLORIDE 0.9 % IV SOLN
Freq: Once | INTRAVENOUS | Status: AC
  Administered 2019-07-10: 11:00:00 via INTRAVENOUS
  Filled 2019-07-10: qty 250

## 2019-07-10 MED ORDER — SODIUM CHLORIDE 0.9 % IV SOLN
300.0000 mg | Freq: Once | INTRAVENOUS | Status: AC
  Administered 2019-07-10: 13:00:00 300 mg via INTRAVENOUS
  Filled 2019-07-10: qty 30

## 2019-07-10 MED ORDER — HEPARIN SOD (PORK) LOCK FLUSH 100 UNIT/ML IV SOLN
500.0000 [IU] | Freq: Once | INTRAVENOUS | Status: AC | PRN
  Administered 2019-07-10: 14:00:00 500 [IU]
  Filled 2019-07-10: qty 5

## 2019-07-10 MED ORDER — SODIUM CHLORIDE 0.9 % IV SOLN: 10.0000 mg | Freq: Once | INTRAVENOUS | Status: DC

## 2019-07-10 MED ORDER — SODIUM CHLORIDE 0.9% FLUSH
10.0000 mL | INTRAVENOUS | Status: DC | PRN
  Administered 2019-07-10: 10 mL
  Filled 2019-07-10: qty 10

## 2019-07-10 MED ORDER — DEXAMETHASONE SODIUM PHOSPHATE 10 MG/ML IJ SOLN
INTRAMUSCULAR | Status: AC
  Filled 2019-07-10: qty 1

## 2019-07-10 NOTE — Progress Notes (Signed)
Pharmacy notified of patient's previous carboplatin infusions. No need for additional premeds at this time per pharmacist.

## 2019-07-10 NOTE — Patient Instructions (Signed)
[facility] Discharge Instructions for Patients Receiving Chemotherapy  Today you received the following chemotherapy agents Gemzar and Carboplatin To help prevent nausea and vomiting after your treatment, we encourage you to take your nausea medication as directed.  If you develop nausea and vomiting that is not controlled by your nausea medication, call the clinic.   BELOW ARE SYMPTOMS THAT SHOULD BE REPORTED IMMEDIATELY:  *FEVER GREATER THAN 100.5 F  *CHILLS WITH OR WITHOUT FEVER  NAUSEA AND VOMITING THAT IS NOT CONTROLLED WITH YOUR NAUSEA MEDICATION  *UNUSUAL SHORTNESS OF BREATH  *UNUSUAL BRUISING OR BLEEDING  TENDERNESS IN MOUTH AND THROAT WITH OR WITHOUT PRESENCE OF ULCERS  *URINARY PROBLEMS  *BOWEL PROBLEMS  UNUSUAL RASH Items with * indicate a potential emergency and should be followed up as soon as possible.  Feel free to call the clinic should you have any questions or concerns. The clinic phone number is [phone].  Please show the [facility] at check-in to the Emergency Department and triage nurse.  Gemcitabine (Gemzar) What is this medicine? GEMCITABINE (jem SYE ta been) is a chemotherapy drug. This medicine is used to treat many types of cancer like breast cancer, lung cancer, pancreatic cancer, and ovarian cancer. This medicine may be used for other purposes; ask your health care provider or pharmacist if you have questions. COMMON BRAND NAME(S): Gemzar, Infugem What should I tell my health care provider before I take this medicine? They need to know if you have any of these conditions:  blood disorders  infection  kidney disease  liver disease  lung or breathing disease, like asthma  recent or ongoing radiation therapy  an unusual or allergic reaction to gemcitabine, other chemotherapy, other medicines, foods, dyes, or preservatives  pregnant or trying to get pregnant  breast-feeding How should I use this  medicine? This drug is given as an infusion into a vein. It is administered in a hospital or clinic by a specially trained health care professional. Talk to your pediatrician regarding the use of this medicine in children. Special care may be needed. Overdosage: If you think you have taken too much of this medicine contact a poison control center or emergency room at once. NOTE: This medicine is only for you. Do not share this medicine with others. What if I miss a dose? It is important not to miss your dose. Call your doctor or health care professional if you are unable to keep an appointment. What may interact with this medicine?  medicines to increase blood counts like filgrastim, pegfilgrastim, sargramostim  some other chemotherapy drugs like cisplatin  vaccines Talk to your doctor or health care professional before taking any of these medicines:  acetaminophen  aspirin  ibuprofen  ketoprofen  naproxen This list may not describe all possible interactions. Give your health care provider a list of all the medicines, herbs, non-prescription drugs, or dietary supplements you use. Also tell them if you smoke, drink alcohol, or use illegal drugs. Some items may interact with your medicine. What should I watch for while using this medicine? Visit your doctor for checks on your progress. This drug may make you feel generally unwell. This is not uncommon, as chemotherapy can affect healthy cells as well as cancer cells. Report any side effects. Continue your course of treatment even though you feel ill unless your doctor tells you to stop. In some cases, you may be given additional medicines to help with side effects. Follow all directions for their use. Call  your doctor or health care professional for advice if you get a fever, chills or sore throat, or other symptoms of a cold or flu. Do not treat yourself. This drug decreases your body's ability to fight infections. Try to avoid being  around people who are sick. This medicine may increase your risk to bruise or bleed. Call your doctor or health care professional if you notice any unusual bleeding. Be careful brushing and flossing your teeth or using a toothpick because you may get an infection or bleed more easily. If you have any dental work done, tell your dentist you are receiving this medicine. Avoid taking products that contain aspirin, acetaminophen, ibuprofen, naproxen, or ketoprofen unless instructed by your doctor. These medicines may hide a fever. Do not become pregnant while taking this medicine or for 6 months after stopping it. Women should inform their doctor if they wish to become pregnant or think they might be pregnant. Men should not father a child while taking this medicine and for 3 months after stopping it. There is a potential for serious side effects to an unborn child. Talk to your health care professional or pharmacist for more information. Do not breast-feed an infant while taking this medicine or for at least 1 week after stopping it. Men should inform their doctors if they wish to father a child. This medicine may lower sperm counts. Talk with your doctor or health care professional if you are concerned about your fertility. What side effects may I notice from receiving this medicine? Side effects that you should report to your doctor or health care professional as soon as possible:  allergic reactions like skin rash, itching or hives, swelling of the face, lips, or tongue  breathing problems  pain, redness, or irritation at site where injected  signs and symptoms of a dangerous change in heartbeat or heart rhythm like chest pain; dizziness; fast or irregular heartbeat; palpitations; feeling faint or lightheaded, falls; breathing problems  signs of decreased platelets or bleeding - bruising, pinpoint red spots on the skin, black, tarry stools, blood in the urine  signs of decreased red blood cells -  unusually weak or tired, feeling faint or lightheaded, falls  signs of infection - fever or chills, cough, sore throat, pain or difficulty passing urine  signs and symptoms of kidney injury like trouble passing urine or change in the amount of urine  signs and symptoms of liver injury like dark yellow or brown urine; general ill feeling or flu-like symptoms; light-colored stools; loss of appetite; nausea; right upper belly pain; unusually weak or tired; yellowing of the eyes or skin  swelling of ankles, feet, hands Side effects that usually do not require medical attention (report to your doctor or health care professional if they continue or are bothersome):  constipation  diarrhea  hair loss  loss of appetite  nausea  rash  vomiting This list may not describe all possible side effects. Call your doctor for medical advice about side effects. You may report side effects to FDA at 1-800-FDA-1088. Where should I keep my medicine? This drug is given in a hospital or clinic and will not be stored at home. NOTE: This sheet is a summary. It may not cover all possible information. If you have questions about this medicine, talk to your doctor, pharmacist, or health care provider.  2020 [employer] Standard (2018-02-14 18:06:11)  Carboplatin What is this medicine? CARBOPLATIN (KAR boe pla tin) is a chemotherapy drug. It targets fast dividing cells, like  cancer cells, and causes these cells to die. This medicine is used to treat ovarian cancer and many other cancers. This medicine may be used for other purposes; ask your health care provider or pharmacist if you have questions. COMMON BRAND NAME(S): Paraplatin What should I tell my health care provider before I take this medicine? They need to know if you have any of these conditions:  blood disorders  hearing problems  kidney disease  recent or ongoing radiation therapy  an unusual or allergic reaction to carboplatin,  cisplatin, other chemotherapy, other medicines, foods, dyes, or preservatives  pregnant or trying to get pregnant  breast-feeding How should I use this medicine? This drug is usually given as an infusion into a vein. It is administered in a hospital or clinic by a specially trained health care professional. Talk to your pediatrician regarding the use of this medicine in children. Special care may be needed. Overdosage: If you think you have taken too much of this medicine contact a poison control center or emergency room at once. NOTE: This medicine is only for you. Do not share this medicine with others. What if I miss a dose? It is important not to miss a dose. Call your doctor or health care professional if you are unable to keep an appointment. What may interact with this medicine?  medicines for seizures  medicines to increase blood counts like filgrastim, pegfilgrastim, sargramostim  some antibiotics like amikacin, gentamicin, neomycin, streptomycin, tobramycin  vaccines Talk to your doctor or health care professional before taking any of these medicines:  acetaminophen  aspirin  ibuprofen  ketoprofen  naproxen This list may not describe all possible interactions. Give your health care provider a list of all the medicines, herbs, non-prescription drugs, or dietary supplements you use. Also tell them if you smoke, drink alcohol, or use illegal drugs. Some items may interact with your medicine. What should I watch for while using this medicine? Your condition will be monitored carefully while you are receiving this medicine. You will need important blood work done while you are taking this medicine. This drug may make you feel generally unwell. This is not uncommon, as chemotherapy can affect healthy cells as well as cancer cells. Report any side effects. Continue your course of treatment even though you feel ill unless your doctor tells you to stop. In some cases, you may be  given additional medicines to help with side effects. Follow all directions for their use. Call your doctor or health care professional for advice if you get a fever, chills or sore throat, or other symptoms of a cold or flu. Do not treat yourself. This drug decreases your body's ability to fight infections. Try to avoid being around people who are sick. This medicine may increase your risk to bruise or bleed. Call your doctor or health care professional if you notice any unusual bleeding. Be careful brushing and flossing your teeth or using a toothpick because you may get an infection or bleed more easily. If you have any dental work done, tell your dentist you are receiving this medicine. Avoid taking products that contain aspirin, acetaminophen, ibuprofen, naproxen, or ketoprofen unless instructed by your doctor. These medicines may hide a fever. Do not become pregnant while taking this medicine. Women should inform their doctor if they wish to become pregnant or think they might be pregnant. There is a potential for serious side effects to an unborn child. Talk to your health care professional or pharmacist for more information.  Do not breast-feed an infant while taking this medicine. What side effects may I notice from receiving this medicine? Side effects that you should report to your doctor or health care professional as soon as possible:  allergic reactions like skin rash, itching or hives, swelling of the face, lips, or tongue  signs of infection - fever or chills, cough, sore throat, pain or difficulty passing urine  signs of decreased platelets or bleeding - bruising, pinpoint red spots on the skin, black, tarry stools, nosebleeds  signs of decreased red blood cells - unusually weak or tired, fainting spells, lightheadedness  breathing problems  changes in hearing  changes in vision  chest pain  high blood pressure  low blood counts - This drug may decrease the number of white  blood cells, red blood cells and platelets. You may be at increased risk for infections and bleeding.  nausea and vomiting  pain, swelling, redness or irritation at the injection site  pain, tingling, numbness in the hands or feet  problems with balance, talking, walking  trouble passing urine or change in the amount of urine Side effects that usually do not require medical attention (report to your doctor or health care professional if they continue or are bothersome):  hair loss  loss of appetite  metallic taste in the mouth or changes in taste This list may not describe all possible side effects. Call your doctor for medical advice about side effects. You may report side effects to FDA at 1-800-FDA-1088. Where should I keep my medicine? This drug is given in a hospital or clinic and will not be stored at home. NOTE: This sheet is a summary. It may not cover all possible information. If you have questions about this medicine, talk to your doctor, pharmacist, or health care provider.  2020 [employer]/Gold Standard (2008-02-26 14:38:05)

## 2019-07-11 ENCOUNTER — Other Ambulatory Visit: Payer: Medicaid Other

## 2019-07-11 ENCOUNTER — Ambulatory Visit: Payer: Medicaid Other | Admitting: Oncology

## 2019-07-11 LAB — CANCER ANTIGEN 27.29: CA 27.29: 144.8 U/mL — ABNORMAL HIGH (ref 0.0–38.6)

## 2019-07-16 NOTE — Progress Notes (Signed)
[place]  [phone] Fax:([phone]   ID: [name] DOB: [birth year]  MR#: [id number]  EUM#:[id number]  Patient Care Team: Dorena Dew, FNP as PCP - General (Family Medicine) Alphonsa Overall, MD as Consulting Physician (General Surgery) , [name], MD as Consulting Physician (Oncology) Gery Pray, MD as Consulting Physician (Radiation Oncology) Delice Bison, Charlestine Massed, NP as Nurse Practitioner (Hematology and Oncology) Alda Berthold, DO as Consulting Physician (Neurology) OTHER MD:  CHIEF COMPLAINT: triple negative stage IV breast cancer  CURRENT TREATMENT: carboplatin/ gemcitabine; zoledronate   INTERVAL HISTORY: Manaal returns today for follow-up and treatment of her recurrent triple negative breast cancer.  She continues on chemotherapy consisting of carboplatin/gemcitabine, given days 1 and 8 of each 21-day cycle. Today is day 8 cycle 1. She notes some soreness, particularly in the right arm and shoulder. She also notes some mental struggle with getting through chemotherapy. She had some issues with nausea, which she took decadron to help; she also took some ginger ale to help.   Since her last visit here, she has not undergone any additional studies.    We are following her CA-27-29:  Lab Results  Component Value Date   CA2729 144.8 (H) 07/10/2019   CA2729 118.3 (H) 06/04/2019    REVIEW OF SYSTEMS: Haruka states that her family is dealing with her diagnosis in their own way. The patient denies unusual headaches, visual changes, vomiting, or dizziness. There has been no unusual cough, phlegm production, or pleurisy. This been no change in bowel or bladder habits. The patient denies unexplained fatigue or unexplained weight loss, bleeding, rash, or fever. A detailed review of systems was otherwise noncontributory.    BREAST CANCER HISTORY: From the original intake note:  Temima herself noted a change in her right breast  sometime around September or October 2016. She did not bring it to intermediate medical attention, but on 01/13/2016 she established herself in Dr. Smith Robert' service and she was set up for bilateral diagnostic mammography with tomosynthesis and bilateral ultrasonography at the [facility] 01/19/2016. The breast density was category B. In the upper outer quadrant of the right breast there was a spiculated mass measuring 2.8 cm. On physical exam this was palpable. Targeted ultrasonography confirmed an irregular hypoechoic mass in the right breast 11:30 o'clock position measuring 2.6 cm maximally. Ultrasound of the right axilla showed a morphologically abnormal lymph node.  In the left breast there were some tubular densities behind the areola which by ultrasonography showed benign ductal ectasia.  On 01/28/2016 Honor underwent biopsy of the right breast mass and abnormal right axillary lymph node. The pathology from this procedure (S [id number]) showed the lymph node to be benign. In the breast however there was an invasive ductal carcinoma, grade 3, which was estrogen and progesterone receptor negative. The proliferation marker was 70%. HER-2 was not amplified with a signals ratio of 1.32. The number per cell was 2.05.  The patient's subsequent history is as detailed below   PAST MEDICAL HISTORY: Past Medical History:  Diagnosis Date   Anxiety    Breast cancer ([place])    Depression    GERD (gastroesophageal reflux disease)    History of radiation therapy 11/15/16-01/12/17   right chest wall and axilla treated to 45 Gy in 25 fractions, boosted and additional 14 Gy in 8 fractions   Hypertension    diet controlled   Obesity (BMI 35.0-39.9 without comorbidity)    Pneumonia    as a child  Seasonal allergies    Sickle cell trait ([place])    Termination of pregnancy (fetus) 04/02/16    PAST SURGICAL HISTORY: Past Surgical History:  Procedure Laterality Date   CESAREAN SECTION     2004  and 2007   MASTECTOMY W/ SENTINEL NODE BIOPSY Right 09/06/2016   Procedure: RIGHT BREAST MASTECTOMY WITH RIGHT AXILLARY SENTINEL LYMPH NODE BIOPSY;  Surgeon: Alphonsa Overall, MD;  Location: [facility];  Service: General;  Laterality: Right;   PORT-A-CATH REMOVAL Left 09/06/2016   Procedure: REMOVAL PORT-A-CATH;  Surgeon: Alphonsa Overall, MD;  Location: [facility];  Service: General;  Laterality: Left;   PORTACATH PLACEMENT     PORTACATH PLACEMENT N/A 07/09/2019   Procedure: INSERTION PORT-A-CATH WITH ULTRASOUND;  Surgeon: Alphonsa Overall, MD;  Location: [facility];  Service: General;  Laterality: N/A;    FAMILY HISTORY Family History  Problem Relation Age of Onset   Hypertension Mother    Cancer Mother        dx "intestinal cancer" in her 20s; +surgery   Other Mother        hysterectomy at young age for unspecified cause   Heart Problems Mother    Breast cancer Cousin        maternal 1st cousin dx female breast cancer at 39-46y   Cancer Father    Hypertension Father    Heart Problems Maternal Aunt    Diabetes Maternal Aunt    Breast cancer Maternal Uncle        dx 64-65   Heart Problems Maternal Uncle    Breast cancer Maternal Grandmother 50   Throat cancer Maternal Grandfather        d. 11s; smoker   Sickle cell anemia Paternal Aunt    Congestive Heart Failure Maternal Aunt    Multiple sclerosis Cousin    Cancer Other        maternal great uncle (MGM's brother); cancer removed from his side   Heart attack Paternal Aunt        d. early 49s  The patient has very little information about her father. Her mother is currently 55 years old. She had a history of cervical cancer at age 66. The patient had 2 brothers, no sisters. The maternal grandfather had throat cancer. A maternal uncle was diagnosed with breast cancer as well as prostate cancer at the age of 36. 2 maternal cousins, one of the mail, had breast cancer as well.   GYNECOLOGIC HISTORY:    No LMP recorded. Menarche age 11, first live birth age 63. The patient is GX P4. She was still having regular periods at the time of diagnosis. She took oral contraceptives in the 1990s with no side effects.--.  Stopped with chemotherapy and have not resumed as of May 2019   SOCIAL HISTORY:  (Updated 10/2018) She works as a Market researcher. The patient's significant other [name] works at [employer].  At home with the patient are her 3 children [name], [place] and [place]. There are age 68, 33, and 83 as of November 2019.  2 of them are disabled or have significant health problems, one with sickle cell disease, the other with autism and developmental delay.  The patient's son [name], currently 1 years old, lives in [place].    ADVANCED DIRECTIVES: Not in place   HEALTH MAINTENANCE: Social History   Socioeconomic History   Marital status: Single    Spouse name: Not on file  Number of children: 4   Years of education: Not on file   Highest education level: Not on file  Occupational History   Occupation: Private Care Attendant    Comment: [employer] [place] resource strain: Not on file   Food insecurity    Worry: Never true    Inability: Never true   Transportation needs    Medical: No    Non-medical: No  Tobacco Use   Smoking status: Former Smoker    Packs/day: 1.00    Years: 20.00    Pack years: 20.00    Types: Cigarettes    Quit date: 04/01/2018    Years since quitting: 1.2   Smokeless tobacco: Never Used   Tobacco comment: Patient has quit smoking x 1 year now  Substance and Sexual Activity   Alcohol use: Yes    Comment: occ   Drug use: No   Sexual activity: Not on file  Lifestyle   Physical activity    Days per week: 7 days    Minutes per session: 60 min   Stress: Not at all  Relationships   Social connections    Talks on phone: Not on file    Gets together: Not  on file    Attends religious service: Not on file    Active member of club or organization: Not on file    Attends meetings of clubs or organizations: Not on file    Relationship status: Not on file  Other Topics Concern   Not on file  Social History Narrative   Not on file     Colonoscopy:  PAP:  Bone density:  Lipid panel:  No Known Allergies  Current Outpatient Medications on File Prior to Visit  Medication Sig Dispense Refill   calcium carbonate (TUMS - DOSED IN MG ELEMENTAL CALCIUM) 500 MG chewable tablet Chew 1 tablet by mouth daily as needed for indigestion or heartburn.     dexamethasone (DECADRON) 4 MG tablet Take 2 tablets (8 mg total) by mouth daily. Start the day after chemotherapy for 2 days. Take with food. 30 tablet 1   gabapentin (NEURONTIN) 300 MG capsule Take 1 tablet at bedtime for one week, then increase to 1 tablet morning and bedtime. (Patient taking differently: Take 300 mg by mouth daily as needed (pain). ) 60 capsule 3   HYDROcodone-acetaminophen (NORCO/VICODIN) 5-325 MG tablet Take 1 tablet by mouth every 6 (six) hours as needed. 10 tablet 0   lidocaine-prilocaine (EMLA) cream Apply to affected area once 30 g 3   LORazepam (ATIVAN) 0.5 MG tablet Take 1 tablet (0.5 mg total) by mouth at bedtime as needed (Nausea or vomiting). 30 tablet 0   omeprazole (PRILOSEC) 20 MG capsule Take 1 capsule (20 mg total) by mouth daily. (Patient taking differently: Take 20 mg by mouth daily as needed (acid reflux). ) 30 capsule 1   prochlorperazine (COMPAZINE) 10 MG tablet Take 1 tablet (10 mg total) by mouth every 6 (six) hours as needed (Nausea or vomiting). 30 tablet 1   traMADol (ULTRAM) 50 MG tablet Take 1 tablet (50 mg total) by mouth every 6 (six) hours as needed. (Patient taking differently: Take 50 mg by mouth every 6 (six) hours as needed for severe pain. ) 30 tablet 0   venlafaxine XR (EFFEXOR-XR) 75 MG 24 hr capsule Take 1 capsule (75 mg total) by mouth  daily with breakfast. 30 capsule 12   No current facility-administered medications on file  prior to visit.     OBJECTIVE: [name] who appears stated age 5:   07/17/19 1239  BP: 118/71  Pulse: 98  Resp: 18  Temp: 98.5 F (36.9 C)  SpO2: 99%   Filed Weights   07/17/19 1239  Weight: 208 lb (94.3 kg)   Body mass index is 38.66 kg/m.   ECOG FS: 1 - Symptomatic but completely ambulatory  Sclerae unicteric, EOMs intact Wearing a mask No cervical or supraclavicular adenopathy Lungs no rales or rhonchi Heart regular rate and rhythm Abd soft, nontender, positive bowel sounds MSK no focal spinal tenderness, no upper extremity lymphedema Neuro: nonfocal, well oriented, appropriate affect Breasts: The right breast is status post mastectomy.  The incision on is very tight.  There is very limited range of motion in the right upper extremity.  We will follow this closely for evidence of response.  Right Chest Wall 07/02/2019:      LAB RESULTS: CMP Latest Ref Rng & Units 07/17/2019 07/10/2019 07/09/2019  Glucose 70 - 99 mg/dL 96 94 104(H)  BUN 6 - 20 mg/dL _0 Creatinine 0.44 - 1.00 mg/dL 0.81 0.84 0.77  Sodium 135 - 145 mmol/L 140 143 141  Potassium 3.5 - 5.1 mmol/L 3.5 3.8 3.3(L)  Chloride 98 - 111 mmol/L 102 107 108  CO2 22 - 32 mmol/L _1 Calcium 8.9 - 10.3 mg/dL 9.4 9.8 9.3  Total Protein 6.5 - 8.1 g/dL 7.7 8.1 -  Total Bilirubin 0.3 - 1.2 mg/dL 0.3 0.3 -  Alkaline Phos 38 - 126 U/L 73 74 -  AST 15 - 41 U/L 16 13(L) -  ALT 0 - 44 U/L 20 10 -     CBC    Component Value Date/Time   WBC 8.9 07/17/2019 1220   RBC 3.81 (L) 07/17/2019 1220   HGB 10.7 (L) 07/17/2019 1220   HGB 11.1 (L) 06/04/2019 1218   HGB 10.4 (L) 10/04/2016 1154   HCT 32.0 (L) 07/17/2019 1220   HCT 31.3 (L) 10/04/2016 1154   PLT 157 07/17/2019 1220   PLT 243 06/04/2019 1218   PLT 320 10/04/2016 1154   MCV 84.0 07/17/2019 1220   MCV 95.7 10/04/2016 1154   MCH 28.1  07/17/2019 1220   MCHC 33.4 07/17/2019 1220   RDW 14.2 07/17/2019 1220   RDW 16.3 (H) 10/04/2016 1154   LYMPHSABS 3.1 07/17/2019 1220   LYMPHSABS 1.7 10/04/2016 1154   MONOABS 0.3 07/17/2019 1220   MONOABS 0.5 10/04/2016 1154   EOSABS 0.1 07/17/2019 1220   EOSABS 0.3 10/04/2016 1154   BASOSABS 0.0 07/17/2019 1220   BASOSABS 0.0 10/04/2016 1154    STUDIES: Nm Pet Image Initial (pi) Skull Base To Thigh  Result Date: 06/19/2019 CLINICAL DATA:  Initial treatment strategy for breast cancer. EXAM: NUCLEAR MEDICINE PET SKULL BASE TO THIGH TECHNIQUE: 10 mCi F-18 FDG was injected intravenously. Full-ring PET imaging was performed from the skull base to thigh after the radiotracer. CT data was obtained and used for attenuation correction and anatomic localization. Fasting blood glucose: 90 mg/dl COMPARISON:  CT chest 06/11/2019 FINDINGS: Mediastinal blood pool activity: SUV max 2.7 Liver activity: SUV max NA NECK: Nonspecific hypermetabolism noted in the nasopharynx and tonsillar regions. No hypermetabolic cervical lymphadenopathy. Incidental CT findings: none CHEST: 2.7 x 3.1 cm mass lesion in the right pectoralis major muscle is markedly hypermetabolic with SUV max = 62.8. 10 mm nodule/lymph node in the right axilla is hypermetabolic with SUV max =  16.4. Focal hypermetabolism in the medial aspect of the right pectoralis major muscle demonstrates SUV max = 6.7. 1.1 cm right internal mammary node seen on 53/4 demonstrates SUV max = 6.7. 1.6 cm short axis left axillary node is hypermetabolic with SUV max = 61.9. Soft tissue attenuation in the inferior aspect of the mastectomy bed is hypermetabolic with SUV max = 50.9 Tiny 5 mm nodule in the inferior right axilla (32/6) is hypermetabolic with SUV max = 2.6. Incidental CT findings: No pericardial or pleural effusion no suspicious pulmonary nodule or mass. No pleural effusion. ABDOMEN/PELVIS: No abnormal hypermetabolic activity within the liver, pancreas, adrenal  glands, or spleen. No hypermetabolic lymph nodes in the abdomen or pelvis. Liver activity is mottled, but no definite hypermetabolic metastatic lesion. Incidental CT findings: none SKELETON: Mottled FDG accumulation is identified in the marrow of the thoracolumbar spine. This may be related to stimulatory effects of therapy, but bony involvement by metastatic disease cannot be entirely excluded. Incidental CT findings: No lytic or sclerotic osseous lesions evident by CT. IMPRESSION: 1. Multifocal hypermetabolic disease in the right anterior chest wall in this patient status post right mastectomy. Dominant lesion is a 3.1 cm mass in the right pectoralis major muscle. 2. Hypermetabolic right internal mammary lymph nodes with hypermetabolic nodes in the right axilla. 3. Large hypermetabolic nodal metastasis in the left axilla. 4. No definite hypermetabolic metastatic disease in the neck, abdomen, or pelvis. 5. Mottled diffuse marrow uptake is presumably related to marrow response to therapy although bony metastatic disease could have this appearance. Electronically Signed   By: Misty Stanley M.D.   On: 06/19/2019 16:03   Dg Chest Port 1 View  Result Date: 07/09/2019 CLINICAL DATA:  Status post Port-A-Cath placement today. EXAM: PORTABLE CHEST 1 VIEW COMPARISON:  PA and lateral chest 05/19/2012. FINDINGS: Port-A-Cath from a left subclavian approach is in place. The tip projects in the right atrium. Lungs are clear. No pneumothorax. Heart size is normal. No acute bony abnormality. IMPRESSION: Port-A-Cath tip projects in the right atrium. Negative for pneumothorax. Electronically Signed   By: Inge Rise M.D.   On: 07/09/2019 09:07   Dg Fluoro Guide Cv Line-no Report  Result Date: 07/09/2019 Fluoroscopy was utilized by the requesting physician.  No radiographic interpretation.   US Breast Ltd Uni Right Inc Axilla  Result Date: 06/27/2019 CLINICAL DATA:  43 year old female status post right mastectomy in  2017. Patient had a recent PET scan on 06/19/2019 which demonstrated a hypermetabolic anterior chest wall mass at the mastectomy site originating from the pectoralis major muscle. Additionally, hypermetabolic lymphadenopathy was seen in the bilateral axilla. The patient presents today for additional diagnostic workup. EXAM: DIGITAL DIAGNOSTIC LEFT MAMMOGRAM WITH CAD AND TOMO ULTRASOUND BILATERAL AXILLA COMPARISON:  Previous exam(s). ACR Breast Density Category b: There are scattered areas of fibroglandular density. FINDINGS: No suspicious mammographic findings identified within left breast. The parenchymal pattern stable. An enlarged lymph node is partially visualized in the left axilla. Mammographic images were processed with CAD. Targeted ultrasound is performed, showing a bulky, heterogenous mass within the right upper outer quadrant of the right mastectomy bed. Exact measurements are difficult due to size, but it measures approximately 5.1 x 4.3 x 2.5 cm. Note is made of associated vascularity. This correlates with the PET CT findings. Additionally, two adjacent, morphologically abnormal lymph nodes with complete effacement of the fatty hila are noted in the right axilla. The larger one measures approximately 10 x 10 x 8 mm and likely corresponds with  the PET-CT findings. Within the left axilla, there is an enlarged, morphologically abnormal lymph node with complete obliteration of the fatty hilum. It measures up to 1.4 cm in short axis dimension. IMPRESSION: 1. Suspicious right upper outer quadrant mastectomy bed mass corresponding with recent PET-CT findings. Recommendation is for ultrasound-guided biopsy. 2. Suspicious bilateral axillary lymphadenopathy corresponding with PET-CT findings. Recommendation is for ultrasound-guided biopsy. 3. No mammographic evidence of malignancy within the left breast. RECOMMENDATION: Three area ultrasound-guided biopsy to include right mastectomy bed mass and bilateral  axillary lymph nodes. This has been scheduled for 06/28/2019 at 8:30 a.m. I have discussed the findings and recommendations with the patient. Results were also provided in writing at the conclusion of the visit. If applicable, a reminder letter will be sent to the patient regarding the next appointment. BI-RADS CATEGORY  5: Highly suggestive of malignancy. Electronically Signed   By: Kristopher Oppenheim M.D.   On: 06/27/2019 14:35   Mm Diag Breast Tomo Uni Left  Result Date: 06/27/2019 CLINICAL DATA:  43 year old female status post right mastectomy in 2017. Patient had a recent PET scan on 06/19/2019 which demonstrated a hypermetabolic anterior chest wall mass at the mastectomy site originating from the pectoralis major muscle. Additionally, hypermetabolic lymphadenopathy was seen in the bilateral axilla. The patient presents today for additional diagnostic workup. EXAM: DIGITAL DIAGNOSTIC LEFT MAMMOGRAM WITH CAD AND TOMO ULTRASOUND BILATERAL AXILLA COMPARISON:  Previous exam(s). ACR Breast Density Category b: There are scattered areas of fibroglandular density. FINDINGS: No suspicious mammographic findings identified within left breast. The parenchymal pattern stable. An enlarged lymph node is partially visualized in the left axilla. Mammographic images were processed with CAD. Targeted ultrasound is performed, showing a bulky, heterogenous mass within the right upper outer quadrant of the right mastectomy bed. Exact measurements are difficult due to size, but it measures approximately 5.1 x 4.3 x 2.5 cm. Note is made of associated vascularity. This correlates with the PET CT findings. Additionally, two adjacent, morphologically abnormal lymph nodes with complete effacement of the fatty hila are noted in the right axilla. The larger one measures approximately 10 x 10 x 8 mm and likely corresponds with the PET-CT findings. Within the left axilla, there is an enlarged, morphologically abnormal lymph node with complete  obliteration of the fatty hilum. It measures up to 1.4 cm in short axis dimension. IMPRESSION: 1. Suspicious right upper outer quadrant mastectomy bed mass corresponding with recent PET-CT findings. Recommendation is for ultrasound-guided biopsy. 2. Suspicious bilateral axillary lymphadenopathy corresponding with PET-CT findings. Recommendation is for ultrasound-guided biopsy. 3. No mammographic evidence of malignancy within the left breast. RECOMMENDATION: Three area ultrasound-guided biopsy to include right mastectomy bed mass and bilateral axillary lymph nodes. This has been scheduled for 06/28/2019 at 8:30 a.m. I have discussed the findings and recommendations with the patient. Results were also provided in writing at the conclusion of the visit. If applicable, a reminder letter will be sent to the patient regarding the next appointment. BI-RADS CATEGORY  5: Highly suggestive of malignancy. Electronically Signed   By: Kristopher Oppenheim M.D.   On: 06/27/2019 14:35   [place] Axillary Node Core Biopsy Left  Addendum Date: 07/01/2019   ADDENDUM REPORT: 07/01/2019 11:17 ADDENDUM: Pathology revealed GRADE III INVASIVE DUCTAL CARCINOMA of the RIGHT mastectomy bed, upper outer, palpable chest wall mass. This was found to be concordant by Dr. Curlene Dolphin. Pathology revealed INVASIVE DUCTAL CARCINOMA of the RIGHT axillary lymph node. There is no distinct nodal tissue identified. This was found to  be concordant by Dr. Curlene Dolphin. Pathology revealed INVASIVE DUCTAL CARCINOMA of the LEFT axillary lymph node. There is no distinct nodal tissue identified. This was found to be concordant by Dr. Curlene Dolphin. Pathology results were discussed with the patient by telephone. The patient reported doing well after the biopsies with tenderness at the sites. Post biopsy instructions and care were reviewed and questions were answered. The patient was encouraged to call [facility] for any additional concerns.  Surgical consultation has been arranged with Dr. Alphonsa Overall, per patient request, at [facility] on July 11, 2019. The patient is scheduled to see Dr. Tressa Busman at [facility] on July 02, 2019. Pathology results were called to Bary Castilla, RN, Nurse Navigator, at [facility] on July 01, 2019. Pathology results reported by Terie Purser, RN on 07/01/2019. Electronically Signed   By: Curlene Dolphin M.D.   On: 07/01/2019 11:17   Result Date: 07/01/2019 CLINICAL DATA:  Ultrasound-guided core needle biopsy was recommended a suspicious left axillary lymph node. EXAM: [place] AXILLARY NODE CORE BIOPSY LEFT COMPARISON:  Previous exam(s). FINDINGS: I met with the patient and we discussed the procedure of ultrasound-guided biopsy, including benefits and alternatives. We discussed the high likelihood of a successful procedure. We discussed the risks of the procedure, including infection, bleeding, tissue injury, clip migration, and inadequate sampling. Informed written consent was given. The usual time-out protocol was performed immediately prior to the procedure. Using sterile technique and 1% Lidocaine as local anesthetic, under direct ultrasound visualization, a 14 gauge spring-loaded device was used to perform biopsy of a suspicious left axillary lymph node using a lateral approach. At the conclusion of the procedure a HydroMARK tissue marker clip was deployed into the biopsy cavity. IMPRESSION: Ultrasound guided biopsy of left axillary lymph node. No apparent complications. Electronically Signed: By: Curlene Dolphin M.D. On: 06/28/2019 10:19   [place] Axillary Node Core Biopsy Right  Addendum Date: 07/01/2019   ADDENDUM REPORT: 07/01/2019 11:17 ADDENDUM: Pathology revealed GRADE III INVASIVE DUCTAL CARCINOMA of the RIGHT mastectomy bed, upper outer, palpable chest wall mass. This was found to be concordant by Dr. Curlene Dolphin. Pathology revealed INVASIVE DUCTAL CARCINOMA of  the RIGHT axillary lymph node. There is no distinct nodal tissue identified. This was found to be concordant by Dr. Curlene Dolphin. Pathology revealed INVASIVE DUCTAL CARCINOMA of the LEFT axillary lymph node. There is no distinct nodal tissue identified. This was found to be concordant by Dr. Curlene Dolphin. Pathology results were discussed with the patient by telephone. The patient reported doing well after the biopsies with tenderness at the sites. Post biopsy instructions and care were reviewed and questions were answered. The patient was encouraged to call [facility] for any additional concerns. Surgical consultation has been arranged with Dr. Alphonsa Overall, per patient request, at [facility] on July 11, 2019. The patient is scheduled to see Dr. Tressa Busman at [facility] on July 02, 2019. Pathology results were called to Bary Castilla, RN, Nurse Navigator, at [facility] on July 01, 2019. Pathology results reported by Terie Purser, RN on 07/01/2019. Electronically Signed   By: Curlene Dolphin M.D.   On: 07/01/2019 11:17   Result Date: 07/01/2019 CLINICAL DATA:  Ultrasound-guided core needle biopsy was recommended of a suspicious right axillary lymph node. EXAM: [place] AXILLARY NODE CORE BIOPSY RIGHT COMPARISON:  Previous exam(s). FINDINGS: I met with the  patient and we discussed the procedure of ultrasound-guided biopsy, including benefits and alternatives. We discussed the high likelihood of a successful procedure. We discussed the risks of the procedure, including infection, bleeding, tissue injury, clip migration, and inadequate sampling. Informed written consent was given. The usual time-out protocol was performed immediately prior to the procedure. Using sterile technique and 1% Lidocaine with and without epinephrine as local anesthetic, under direct ultrasound visualization, a 14 gauge spring-loaded device was used to perform biopsy of  a suspicious right axillary lymph node using a lateral approach. At the conclusion of the procedure a HydroMARK tissue marker clip was deployed into the biopsy cavity. IMPRESSION: Ultrasound guided biopsy of right axillary lymph node. No apparent complications. Electronically Signed: By: Curlene Dolphin M.D. On: 06/28/2019 10:18   [place] Axilla Left  Result Date: 06/27/2019 CLINICAL DATA:  43 year old female status post right mastectomy in 2017. Patient had a recent PET scan on 06/19/2019 which demonstrated a hypermetabolic anterior chest wall mass at the mastectomy site originating from the pectoralis major muscle. Additionally, hypermetabolic lymphadenopathy was seen in the bilateral axilla. The patient presents today for additional diagnostic workup. EXAM: DIGITAL DIAGNOSTIC LEFT MAMMOGRAM WITH CAD AND TOMO ULTRASOUND BILATERAL AXILLA COMPARISON:  Previous exam(s). ACR Breast Density Category b: There are scattered areas of fibroglandular density. FINDINGS: No suspicious mammographic findings identified within left breast. The parenchymal pattern stable. An enlarged lymph node is partially visualized in the left axilla. Mammographic images were processed with CAD. Targeted ultrasound is performed, showing a bulky, heterogenous mass within the right upper outer quadrant of the right mastectomy bed. Exact measurements are difficult due to size, but it measures approximately 5.1 x 4.3 x 2.5 cm. Note is made of associated vascularity. This correlates with the PET CT findings. Additionally, two adjacent, morphologically abnormal lymph nodes with complete effacement of the fatty hila are noted in the right axilla. The larger one measures approximately 10 x 10 x 8 mm and likely corresponds with the PET-CT findings. Within the left axilla, there is an enlarged, morphologically abnormal lymph node with complete obliteration of the fatty hilum. It measures up to 1.4 cm in short axis dimension. IMPRESSION: 1. Suspicious right  upper outer quadrant mastectomy bed mass corresponding with recent PET-CT findings. Recommendation is for ultrasound-guided biopsy. 2. Suspicious bilateral axillary lymphadenopathy corresponding with PET-CT findings. Recommendation is for ultrasound-guided biopsy. 3. No mammographic evidence of malignancy within the left breast. RECOMMENDATION: Three area ultrasound-guided biopsy to include right mastectomy bed mass and bilateral axillary lymph nodes. This has been scheduled for 06/28/2019 at 8:30 a.m. I have discussed the findings and recommendations with the patient. Results were also provided in writing at the conclusion of the visit. If applicable, a reminder letter will be sent to the patient regarding the next appointment. BI-RADS CATEGORY  5: Highly suggestive of malignancy. Electronically Signed   By: Kristopher Oppenheim M.D.   On: 06/27/2019 14:35   [place] Rt Breast Bx W Loc Dev 1st Lesion Img Bx Spec US Guide  Addendum Date: 07/01/2019   ADDENDUM REPORT: 07/01/2019 11:17 ADDENDUM: Pathology revealed GRADE III INVASIVE DUCTAL CARCINOMA of the RIGHT mastectomy bed, upper outer, palpable chest wall mass. This was found to be concordant by Dr. Curlene Dolphin. Pathology revealed INVASIVE DUCTAL CARCINOMA of the RIGHT axillary lymph node. There is no distinct nodal tissue identified. This was found to be concordant by Dr. Curlene Dolphin. Pathology revealed INVASIVE DUCTAL CARCINOMA of the LEFT axillary lymph node. There is no distinct nodal tissue  identified. This was found to be concordant by Dr. Curlene Dolphin. Pathology results were discussed with the patient by telephone. The patient reported doing well after the biopsies with tenderness at the sites. Post biopsy instructions and care were reviewed and questions were answered. The patient was encouraged to call [facility] for any additional concerns. Surgical consultation has been arranged with Dr. Alphonsa Overall, per patient request, at  [facility] on July 11, 2019. The patient is scheduled to see Dr. Tressa Busman at [facility] on July 02, 2019. Pathology results were called to Bary Castilla, RN, Nurse Navigator, at [facility] on July 01, 2019. Pathology results reported by Terie Purser, RN on 07/01/2019. Electronically Signed   By: Curlene Dolphin M.D.   On: 07/01/2019 11:17   Result Date: 07/01/2019 CLINICAL DATA:  Ultrasound-guided core needle biopsy was recommended of a suspicious palpable mass right mastectomy bed upper outer quadrant. EXAM: ULTRASOUND GUIDED RIGHT BREAST (MASTECTOMY) CORE NEEDLE BIOPSY COMPARISON:  Previous exam(s). FINDINGS: I met with the patient and we discussed the procedure of ultrasound-guided biopsy, including benefits and alternatives. We discussed the high likelihood of a successful procedure. We discussed the risks of the procedure, including infection, bleeding, tissue injury, clip migration, and inadequate sampling. Informed written consent was given. The usual time-out protocol was performed immediately prior to the procedure. Lesion quadrant: Upper outer quadrant Using sterile technique and 1% Lidocaine with and without epinephrine as local anesthetic, under direct ultrasound visualization, a 14 gauge spring-loaded device was used to perform biopsy of the mass using a lateral approach. At the conclusion of the procedure a HydroMARK tissue marker clip was deployed into the biopsy cavity. IMPRESSION: Ultrasound guided biopsy of the right mastectomy bed. No apparent complications. Electronically Signed: By: Curlene Dolphin M.D. On: 06/28/2019 10:13    RESEARCH: Referred to PREVENT study, but was pregnant at the time; referred to [facility] 11202, but the biopsied lymph node was benign; referred to weight loss study but declined; referred to [facility] 12/09/2000, but the timing of radiation and 8 was greater than 60 days past the date of diagnosis; referred to  health disparity study, but declined; referred to [id number] adjuvant therapy study, but the patient received Xeloda with radiation and therefore was ineligible   ASSESSMENT: 43 y.o. [place] woman status post right breast upper-outer quadrant biopsy 01/28/2016 for a clinical T2 N0 invasive ductal carcinoma, grade 3, triple negative, with an MIB-1 of 70%.  (a) suspicious right axillary lymph node biopsied 01/28/2016 was benign  (1) neoadjuvant chemotherapy: doxorubicin and cyclophosphamide in dose dense fashion 4 started 04/14/16, completed 05/26/2016, followed by paclitaxel and carboplatin weekly 12, Started 06/09/2016  (a) taxol discontinued after 7 doses because of neuropathy, last dose 07/21/2016  (2) genetics testing October 20, 2016 through the 32-gene Comprehensive Cancer Panel offered by [facility] [place], MD) (with MSH2 Exons 1-7 Inversion Analysis) found no deleterious mutations or VUSS  In APC, ATM, AXIN2, BARD1, BMPR1A, BRCA1, BRCA2, BRIP1, CDH1, CDK4, CDKN2A, CHEK2, EPCAM, FANCC, MLH1, MSH2, MSH6, MUTYH, NBN, PALB2, PMS2, POLD1, POLE, PTEN, RAD51C, RAD51D, SCG5/GREM1, SMAD4, STK11, TP53, VHL, and XRCC2.    (3) right mastectomy and sentinel lymph node sampling 09/06/2016 showed a residual ypT1c ypN0, invasive ductal carcinoma, grade 3, with negative margins. Repeat prognostic panel again triple negative   (4) adjuvant radiation with capecitabine/Xeloda sensitization 11/15/16 - 01/12/17 Site/dose:   Right Chest Wall and axilla (4 field) treated to 45  Gy in 25 fractions, and then Boosted an additional 14.4 Gy in 8 fractions.  (5) tobacco abuse disorder: The patient quit smoking 04/04/2018  METASTATIC DISEASE:  July 2020 (6) nonspecific changes noted on chest CT 06/11/2019 were clarified by PET scan 06/19/2019 showing hypermetabolic disease in the right anterior chest wall, right internal mammary nodes, right and left axillary nodes, but no metastatic disease in the neck,  lungs, abdomen or pelvis.  Bone marrow uptake suggests bony metastatic disease.  (a) CARIS requested 07/03/19  (b) CA-27-29 is informative: was 118.3 on 06/04/2019  (7) zoledronate to start 07/17/2019  (8) carboplatin/ gemcitabine days 1 and 8 Q21 day cycle started 07/10/2019   PLAN: Rayetta is tolerating her chemotherapy well.  I have gone ahead and added all her treatments for the first 4 cycles with visits on day 1 of each cycle.  We will also be following the CA-27-29 at the beginning of each cycle.  When she completes 4 cycles, 12 weeks, 8 doses, she will be completely restaged.  She is seriously considering disability.  In fact her right upper extremity is nonfunctional at this point.  If she does decide to apply for disability I would support that decision  We again reviewed nausea prophylaxis and today she will receive her first zoledronate dose.  She has a good understanding of the possible toxicities side effects and complications of that agent  I spent approximately 30 minutes face to face with Dnasia with more than 50% of that time spent in counseling and coordination of care.      [name]. , MD  07/17/19 12:59 PM Medical Oncology and Hematology [facility] [address] [place], [place] [address] Tel. [phone]    Fax. [phone]   I, Jacqualyn Posey am acting as a Education administrator for Chauncey Cruel, MD.   I, Lurline Del MD, have reviewed the above documentation for accuracy and completeness, and I agree with the above.

## 2019-07-17 ENCOUNTER — Inpatient Hospital Stay: Payer: Medicaid Other

## 2019-07-17 ENCOUNTER — Other Ambulatory Visit: Payer: Self-pay | Admitting: Oncology

## 2019-07-17 ENCOUNTER — Inpatient Hospital Stay (HOSPITAL_BASED_OUTPATIENT_CLINIC_OR_DEPARTMENT_OTHER): Payer: Medicaid Other | Admitting: Oncology

## 2019-07-17 ENCOUNTER — Other Ambulatory Visit: Payer: Self-pay

## 2019-07-17 VITALS — BP 118/71 | HR 98 | Temp 98.5°F | Resp 18 | Ht 61.5 in | Wt 208.0 lb

## 2019-07-17 DIAGNOSIS — C50411 Malignant neoplasm of upper-outer quadrant of right female breast: Secondary | ICD-10-CM

## 2019-07-17 DIAGNOSIS — Z95828 Presence of other vascular implants and grafts: Secondary | ICD-10-CM

## 2019-07-17 DIAGNOSIS — Z5111 Encounter for antineoplastic chemotherapy: Secondary | ICD-10-CM | POA: Diagnosis not present

## 2019-07-17 DIAGNOSIS — C7951 Secondary malignant neoplasm of bone: Secondary | ICD-10-CM

## 2019-07-17 DIAGNOSIS — Z171 Estrogen receptor negative status [ER-]: Secondary | ICD-10-CM

## 2019-07-17 DIAGNOSIS — C50919 Malignant neoplasm of unspecified site of unspecified female breast: Secondary | ICD-10-CM | POA: Diagnosis not present

## 2019-07-17 LAB — CBC WITH DIFFERENTIAL/PLATELET
Abs Immature Granulocytes: 0.04 10*3/uL (ref 0.00–0.07)
Basophils Absolute: 0 10*3/uL (ref 0.0–0.1)
Basophils Relative: 0 %
Eosinophils Absolute: 0.1 10*3/uL (ref 0.0–0.5)
Eosinophils Relative: 1 %
HCT: 32 % — ABNORMAL LOW (ref 36.0–46.0)
Hemoglobin: 10.7 g/dL — ABNORMAL LOW (ref 12.0–15.0)
Immature Granulocytes: 1 %
Lymphocytes Relative: 35 %
Lymphs Abs: 3.1 10*3/uL (ref 0.7–4.0)
MCH: 28.1 pg (ref 26.0–34.0)
MCHC: 33.4 g/dL (ref 30.0–36.0)
MCV: 84 fL (ref 80.0–100.0)
Monocytes Absolute: 0.3 10*3/uL (ref 0.1–1.0)
Monocytes Relative: 3 %
Neutro Abs: 5.3 10*3/uL (ref 1.7–7.7)
Neutrophils Relative %: 60 %
Platelets: 157 10*3/uL (ref 150–400)
RBC: 3.81 MIL/uL — ABNORMAL LOW (ref 3.87–5.11)
RDW: 14.2 % (ref 11.5–15.5)
WBC: 8.9 10*3/uL (ref 4.0–10.5)
nRBC: 0 % (ref 0.0–0.2)

## 2019-07-17 LAB — COMPREHENSIVE METABOLIC PANEL
ALT: 20 U/L (ref 0–44)
AST: 16 U/L (ref 15–41)
Albumin: 3.6 g/dL (ref 3.5–5.0)
Alkaline Phosphatase: 73 U/L (ref 38–126)
Anion gap: 13 (ref 5–15)
BUN: 7 mg/dL (ref 6–20)
CO2: 25 mmol/L (ref 22–32)
Calcium: 9.4 mg/dL (ref 8.9–10.3)
Chloride: 102 mmol/L (ref 98–111)
Creatinine, Ser: 0.81 mg/dL (ref 0.44–1.00)
GFR calc Af Amer: >60 mL/min (ref >60)
GFR calc non Af Amer: >60 mL/min (ref >60)
Glucose, Bld: 96 mg/dL (ref 70–99)
Potassium: 3.5 mmol/L (ref 3.5–5.1)
Sodium: 140 mmol/L (ref 135–145)
Total Bilirubin: 0.3 mg/dL (ref 0.3–1.2)
Total Protein: 7.7 g/dL (ref 6.5–8.1)

## 2019-07-17 MED ORDER — HEPARIN SOD (PORK) LOCK FLUSH 100 UNIT/ML IV SOLN
500.0000 [IU] | Freq: Once | INTRAVENOUS | Status: AC | PRN
  Administered 2019-07-17: 500 [IU]
  Filled 2019-07-17: qty 5

## 2019-07-17 MED ORDER — SODIUM CHLORIDE 0.9% FLUSH
10.0000 mL | INTRAVENOUS | Status: DC | PRN
  Administered 2019-07-17: 10 mL via INTRAVENOUS
  Filled 2019-07-17: qty 10

## 2019-07-17 MED ORDER — SODIUM CHLORIDE 0.9% FLUSH
10.0000 mL | INTRAVENOUS | Status: DC | PRN
  Administered 2019-07-17: 10 mL
  Filled 2019-07-17: qty 10

## 2019-07-17 MED ORDER — PALONOSETRON HCL INJECTION 0.25 MG/5ML
INTRAVENOUS | Status: AC
  Filled 2019-07-17: qty 5

## 2019-07-17 MED ORDER — DEXAMETHASONE SODIUM PHOSPHATE 10 MG/ML IJ SOLN
10.0000 mg | Freq: Once | INTRAMUSCULAR | Status: AC
  Administered 2019-07-17: 10 mg via INTRAVENOUS

## 2019-07-17 MED ORDER — TRAMADOL HCL 50 MG PO TABS: 50.0000 mg | ORAL_TABLET | Freq: Four times a day (QID) | ORAL | 0 refills | Status: DC | PRN

## 2019-07-17 MED ORDER — ZOLEDRONIC ACID 4 MG/100ML IV SOLN
4.0000 mg | Freq: Once | INTRAVENOUS | Status: AC
  Administered 2019-07-17: 4 mg via INTRAVENOUS
  Filled 2019-07-17: qty 100

## 2019-07-17 MED ORDER — OMEPRAZOLE 20 MG PO CPDR: 20.0000 mg | DELAYED_RELEASE_CAPSULE | Freq: Every day | ORAL | 1 refills | Status: DC

## 2019-07-17 MED ORDER — SODIUM CHLORIDE 0.9 % IV SOLN
1000.0000 mg/m2 | Freq: Once | INTRAVENOUS | Status: AC
  Administered 2019-07-17: 2052 mg via INTRAVENOUS
  Filled 2019-07-17: qty 53.97

## 2019-07-17 MED ORDER — PALONOSETRON HCL INJECTION 0.25 MG/5ML
0.2500 mg | Freq: Once | INTRAVENOUS | Status: AC
  Administered 2019-07-17: 0.25 mg via INTRAVENOUS

## 2019-07-17 MED ORDER — SODIUM CHLORIDE 0.9 % IV SOLN
300.0000 mg | Freq: Once | INTRAVENOUS | Status: AC
  Administered 2019-07-17: 300 mg via INTRAVENOUS
  Filled 2019-07-17: qty 30

## 2019-07-17 MED ORDER — SODIUM CHLORIDE 0.9 % IV SOLN
Freq: Once | INTRAVENOUS | Status: AC
  Administered 2019-07-17: 13:00:00 via INTRAVENOUS
  Filled 2019-07-17: qty 250

## 2019-07-17 MED ORDER — DEXAMETHASONE SODIUM PHOSPHATE 10 MG/ML IJ SOLN
INTRAMUSCULAR | Status: AC
  Filled 2019-07-17: qty 1

## 2019-07-17 NOTE — Addendum Note (Signed)
Addended by: Chauncey Cruel on: 07/17/2019 01:31 PM   Modules accepted: Orders

## 2019-07-17 NOTE — Patient Instructions (Signed)
Zoledronic Acid injection (Hypercalcemia, Oncology) What is this medicine? ZOLEDRONIC ACID (ZOE le dron ik AS id) lowers the amount of calcium loss from bone. It is used to treat too much calcium in your blood from cancer. It is also used to prevent complications of cancer that has spread to the bone. This medicine may be used for other purposes; ask your health care provider or pharmacist if you have questions. COMMON BRAND NAME(S): Zometa What should I tell my health care provider before I take this medicine? They need to know if you have any of these conditions:  aspirin-sensitive asthma  cancer, especially if you are receiving medicines used to treat cancer  dental disease or wear dentures  infection  kidney disease  receiving corticosteroids like dexamethasone or prednisone  an unusual or allergic reaction to zoledronic acid, other medicines, foods, dyes, or preservatives  pregnant or trying to get pregnant  breast-feeding How should I use this medicine? This medicine is for infusion into a vein. It is given by a health care professional in a hospital or clinic setting. Talk to your pediatrician regarding the use of this medicine in children. Special care may be needed. Overdosage: If you think you have taken too much of this medicine contact a poison control center or emergency room at once. NOTE: This medicine is only for you. Do not share this medicine with others. What if I miss a dose? It is important not to miss your dose. Call your doctor or health care professional if you are unable to keep an appointment. What may interact with this medicine?  certain antibiotics given by injection  NSAIDs, medicines for pain and inflammation, like ibuprofen or naproxen  some diuretics like bumetanide, furosemide  teriparatide  thalidomide This list may not describe all possible interactions. Give your health care provider a list of all the medicines, herbs, non-prescription  drugs, or dietary supplements you use. Also tell them if you smoke, drink alcohol, or use illegal drugs. Some items may interact with your medicine. What should I watch for while using this medicine? Visit your doctor or health care professional for regular checkups. It may be some time before you see the benefit from this medicine. Do not stop taking your medicine unless your doctor tells you to. Your doctor may order blood tests or other tests to see how you are doing. Women should inform their doctor if they wish to become pregnant or think they might be pregnant. There is a potential for serious side effects to an unborn child. Talk to your health care professional or pharmacist for more information. You should make sure that you get enough calcium and vitamin D while you are taking this medicine. Discuss the foods you eat and the vitamins you take with your health care professional. Some people who take this medicine have severe bone, joint, and/or muscle pain. This medicine may also increase your risk for jaw problems or a broken thigh bone. Tell your doctor right away if you have severe pain in your jaw, bones, joints, or muscles. Tell your doctor if you have any pain that does not go away or that gets worse. Tell your dentist and dental surgeon that you are taking this medicine. You should not have major dental surgery while on this medicine. See your dentist to have a dental exam and fix any dental problems before starting this medicine. Take good care of your teeth while on this medicine. Make sure you see your dentist for regular follow-up   appointments. What side effects may I notice from receiving this medicine? Side effects that you should report to your doctor or health care professional as soon as possible:  allergic reactions like skin rash, itching or hives, swelling of the face, lips, or tongue  anxiety, confusion, or depression  breathing problems  changes in vision  eye  pain  feeling faint or lightheaded, falls  jaw pain, especially after dental work  mouth sores  muscle cramps, stiffness, or weakness  redness, blistering, peeling or loosening of the skin, including inside the mouth  trouble passing urine or change in the amount of urine Side effects that usually do not require medical attention (report to your doctor or health care professional if they continue or are bothersome):  bone, joint, or muscle pain  constipation  diarrhea  fever  hair loss  irritation at site where injected  loss of appetite  nausea, vomiting  stomach upset  trouble sleeping  trouble swallowing  weak or tired This list may not describe all possible side effects. Call your doctor for medical advice about side effects. You may report side effects to FDA at 1-800-FDA-1088. Where should I keep my medicine? This drug is given in a hospital or clinic and will not be stored at home. NOTE: This sheet is a summary. It may not cover all possible information. If you have questions about this medicine, talk to your doctor, pharmacist, or health care provider.  2020 Elsevier/Gold Standard (2014-04-19 14:19:39)  

## 2019-07-17 NOTE — Progress Notes (Signed)
Patient informed to take OTC tums day of and day after Zometa infusion per Rennis Harding, RN, per MD Magrinat. Pt verbalizes understanding and agrees with plan of care.

## 2019-07-18 ENCOUNTER — Telehealth: Payer: Self-pay | Admitting: Oncology

## 2019-07-18 NOTE — Telephone Encounter (Signed)
I left a message regarding schedule also mailed

## 2019-07-24 ENCOUNTER — Telehealth: Payer: Self-pay | Admitting: *Deleted

## 2019-07-24 NOTE — Telephone Encounter (Signed)
This RN spoke with Rebecca [name] per VM stating issues with constipation. [name] states last BM was " I think it was last Wednesday".  [name] states she is passing gas. She denies any nausea or bowel discomfort and she is able to drink and eat as usual.  Since then [facility] has used increased calcium ( for use with zometa ) - and started tramadol.  This RN discussed above causes and to hold off on any additional calcium at present.  Recommendation given regarding use of miralax as well as to lubricate anal area for ease and comfort of passing of hard stool. Recommendation also given for Rebecca [name] to increase fluid intake including use of hot liquids.  Keryn verbalized understanding including to call if above not beneficial.

## 2019-07-25 DIAGNOSIS — C773 Secondary and unspecified malignant neoplasm of axilla and upper limb lymph nodes: Secondary | ICD-10-CM | POA: Diagnosis not present

## 2019-07-25 DIAGNOSIS — Z171 Estrogen receptor negative status [ER-]: Secondary | ICD-10-CM | POA: Diagnosis not present

## 2019-07-25 DIAGNOSIS — C50411 Malignant neoplasm of upper-outer quadrant of right female breast: Secondary | ICD-10-CM | POA: Diagnosis not present

## 2019-07-25 DIAGNOSIS — Z803 Family history of malignant neoplasm of breast: Secondary | ICD-10-CM | POA: Diagnosis not present

## 2019-07-28 ENCOUNTER — Other Ambulatory Visit: Payer: Self-pay | Admitting: Oncology

## 2019-07-28 NOTE — Progress Notes (Signed)
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  The tumor mutational burden is low, BRCA 1 and 2 are negative and PI K3 shows a variant of uncertain significance.

## 2019-07-30 NOTE — Progress Notes (Addendum)
[place]  [phone] [phone]) [phone]   ID: [name] DOB: [birth year]  MR#: [id number]  SVX#:[id number]  Patient Care Team: Dorena Dew, FNP as PCP - General (Family Medicine) Alphonsa Overall, MD as Consulting Physician (General Surgery) Tra Wilemon, [name], MD as Consulting Physician (Oncology) Gery Pray, MD as Consulting Physician (Radiation Oncology) Delice Bison, Charlestine Massed, NP as Nurse Practitioner (Hematology and Oncology) Alda Berthold, DO as Consulting Physician (Neurology) OTHER MD:  CHIEF COMPLAINT: triple negative stage IV breast cancer  CURRENT TREATMENT: carboplatin/ gemcitabine; zoledronate   INTERVAL HISTORY: Rebecca [name] returns today for follow-up and treatment of her recurrent triple negative breast cancer. She was last seen here on 07/17/2019.   She continues on carboplatin and gemcitabine.  Today is day 1 cycle 2.  She receives treatment on days 1 and 8 of each 21-day cycle.  She also continues on zolendronate.  Her most recent dose was 07/17/2019 and she receives this every 12 weeks.  We went over her medications today and it is very difficult to know what she is taking.  She apparently never filled out the Neurontin or Effexor prescriptions.  She thinks she is taking the Decadron 2 tablets twice daily on days 2 on 3-week which is of course a good for her nausea.  She does not know whether she has Compazine at home.    REVIEW OF SYSTEMS: Joury tells me she had an episode of chills about 10 days ago, without fever, not associated with dysuria, cough, or diarrhea.  In fact she was constipated.  She went under a blanket and it passed.  She did not call to tell us about that.  She use MiraLAX for the constipation.  She has had no unusual headaches visual changes dizziness gait imbalance nausea or vomiting.  Her port is working well.  A detailed review of systems was otherwise stable.  BREAST CANCER HISTORY: From the original  intake note:  Rebecca [name] herself noted a change in her right breast sometime around September or October 2016. She did not bring it to intermediate medical attention, but on 01/13/2016 she established herself in Dr. Smith Robert' service and she was set up for bilateral diagnostic mammography with tomosynthesis and bilateral ultrasonography at the [facility] 01/19/2016. The breast density was category B. In the upper outer quadrant of the right breast there was a spiculated mass measuring 2.8 cm. On physical exam this was palpable. Targeted ultrasonography confirmed an irregular hypoechoic mass in the right breast 11:30 o'clock position measuring 2.6 cm maximally. Ultrasound of the right axilla showed a morphologically abnormal lymph node.  In the left breast there were some tubular densities behind the areola which by ultrasonography showed benign ductal ectasia.  On 01/28/2016 Mar[place]e underwent biopsy of the right breast mass and abnormal right axillary lymph node. The pathology from this procedure (S [id number]) showed the lymph node to be benign. In the breast however there was an invasive ductal carcinoma, grade 3, which was estrogen and progesterone receptor negative. The proliferation marker was 70%. HER-2 was not amplified with a signals ratio of 1.32. The number per cell was 2.05.  The patient's subsequent history is as detailed below   PAST MEDICAL HISTORY: Past Medical History:  Diagnosis Date  . Anxiety   . Breast cancer ([place])   . Depression   . GERD (gastroesophageal reflux disease)   . History of radiation therapy 11/15/16-01/12/17   right chest wall and axilla treated to 45 Gy in 25 fractions, boosted  and additional 14 Gy in 8 fractions  . Hypertension    diet controlled  . Obesity (BMI 35.0-39.9 without comorbidity)   . Pneumonia    as a child  . Seasonal allergies   . Sickle cell trait ([place])   . Termination of pregnancy (fetus) 04/02/16    PAST SURGICAL HISTORY: Past Surgical  History:  Procedure Laterality Date  . CESAREAN SECTION     2004 and 2007  . MASTECTOMY W/ SENTINEL NODE BIOPSY Right 09/06/2016   Procedure: RIGHT BREAST MASTECTOMY WITH RIGHT AXILLARY SENTINEL LYMPH NODE BIOPSY;  Surgeon: Alphonsa Overall, MD;  Location: [facility];  Service: General;  Laterality: Right;  . PORT-A-CATH REMOVAL Left 09/06/2016   Procedure: REMOVAL PORT-A-CATH;  Surgeon: Alphonsa Overall, MD;  Location: [facility];  Service: General;  Laterality: Left;  . PORTACATH PLACEMENT    . PORTACATH PLACEMENT N/A 07/09/2019   Procedure: INSERTION PORT-A-CATH WITH ULTRASOUND;  Surgeon: Alphonsa Overall, MD;  Location: [facility];  Service: General;  Laterality: N/A;    FAMILY HISTORY Family History  Problem Relation Age of Onset  . Hypertension Mother   . Cancer Mother        dx "intestinal cancer" in her 60s; +surgery  . Other Mother        hysterectomy at young age for unspecified cause  . Heart Problems Mother   . Breast cancer Cousin        maternal 1st cousin dx female breast cancer at 46-46y  . Cancer Father   . Hypertension Father   . Heart Problems Maternal Aunt   . Diabetes Maternal Aunt   . Breast cancer Maternal Uncle        dx 64-65  . Heart Problems Maternal Uncle   . Breast cancer Maternal Grandmother 56  . Throat cancer Maternal Grandfather        d. 18s; smoker  . Sickle cell anemia Paternal Aunt   . Congestive Heart Failure Maternal Aunt   . Multiple sclerosis Cousin   . Cancer Other        maternal great uncle (MGM's brother); cancer removed from his side  . Heart attack Paternal Aunt        d. early 19s  The patient has very little information about her father. Her mother is currently 49 years old. She had a history of cervical cancer at age 55. The patient had 2 brothers, no sisters. The maternal grandfather had throat cancer. A maternal uncle was diagnosed with breast cancer as well as prostate cancer at the age of 16. 2 maternal cousins,  one of the mail, had breast cancer as well.   GYNECOLOGIC HISTORY:  No LMP recorded. Menarche age 43, first live birth age 43. The patient is GX P4. She was still having regular periods at the time of diagnosis. She took oral contraceptives in the 1990s with no side effects.--.  Stopped with chemotherapy and have not resumed as of May 2019   SOCIAL HISTORY:  (Updated 10/2018) She works as a Market researcher. The patient's significant other [name] works at [employer].  At home with the patient are her 3 children [name], [place] and [place]. There are age 53, 36, and 14 as of November 2019.  2 of them are disabled or have significant health problems, one with sickle cell disease, the other with autism and developmental delay.  The patient's son [name], currently 22 years old, lives in [place].    ADVANCED DIRECTIVES: Not in place   HEALTH MAINTENANCE: Social History   Socioeconomic History  . Marital status: Single    Spouse name: Not on file  . Number of children: 4  . Years of education: Not on file  . Highest education level: Not on file  Occupational History  . Occupation: Private Care Attendant    Comment: First choice [employer]  . Financial resource strain: Not on file  . Food insecurity    Worry: Never true    Inability: Never true  . Transportation needs    Medical: No    Non-medical: No  Tobacco Use  . Smoking status: Former Smoker    Packs/day: 1.00    Years: 20.00    Pack years: 20.00    Types: Cigarettes    Quit date: 04/01/2018    Years since quitting: 1.3  . Smokeless tobacco: Never Used  . Tobacco comment: Patient has quit smoking x 1 year now  Substance and Sexual Activity  . Alcohol use: Yes    Comment: occ  . Drug use: No  . Sexual activity: Not on file  Lifestyle  . Physical activity    Days per week: 7 days    Minutes per session: 60 min  . Stress: Not at all  Relationships  .  Social Herbalist on phone: Not on file    Gets together: Not on file    Attends religious service: Not on file    Active member of club or organization: Not on file    Attends meetings of clubs or organizations: Not on file    Relationship status: Not on file  Other Topics Concern  . Not on file  Social History Narrative  . Not on file     Colonoscopy:  PAP:  Bone density:  Lipid panel:  No Known Allergies  Current Outpatient Medications on File Prior to Visit  Medication Sig Dispense Refill  . calcium carbonate (TUMS - DOSED IN MG ELEMENTAL CALCIUM) 500 MG chewable tablet Chew 1 tablet by mouth daily as needed for indigestion or heartburn.    . dexamethasone (DECADRON) 4 MG tablet Take 2 tablets (8 mg total) by mouth daily. Start the day after chemotherapy for 2 days. Take with food. 30 tablet 1  . gabapentin (NEURONTIN) 300 MG capsule Take 1 tablet at bedtime for one week, then increase to 1 tablet morning and bedtime. (Patient taking differently: Take 300 mg by mouth daily as needed (pain). ) 60 capsule 3  . lidocaine-prilocaine (EMLA) cream Apply to affected area once 30 g 3  . omeprazole (PRILOSEC) 20 MG capsule Take 1 capsule (20 mg total) by mouth daily. 30 capsule 1  . prochlorperazine (COMPAZINE) 10 MG tablet Take 1 tablet (10 mg total) by mouth every 6 (six) hours as needed (Nausea or vomiting). 30 tablet 1  . traMADol (ULTRAM) 50 MG tablet Take 1 tablet (50 mg total) by mouth every 6 (six) hours as needed. 30 tablet 0  . venlafaxine XR (EFFEXOR-XR) 75 MG 24 hr capsule Take 1 capsule (75 mg total) by mouth daily with breakfast. 30 capsule 12   No current facility-administered medications on file prior to visit.     OBJECTIVE: [name] in no acute distress  Vitals:   07/31/19 1052  BP: 119/73  Pulse: 86  Resp: 18  Temp: 98.3 F (36.8 C)  SpO2: 100%   Wt Readings from Last  3 Encounters:  07/31/19 208 lb 4.8 oz (94.5 kg)  07/17/19  208 lb (94.3 kg)  07/10/19 212 lb 8 oz (96.4 kg)   Body mass index is 38.72 kg/m.    ECOG FS:1 - Symptomatic but completely ambulatory  Ocular: Sclerae unicteric, pupils round and equal Ear-nose-throat: Wearing a mask Lymphatic: No cervical or supraclavicular adenopathy Lungs no rales or rhonchi Heart regular rate and rhythm Abd soft, nontender, positive bowel sounds MSK no focal spinal tenderness, no joint edema Neuro: non-focal, well-oriented, appropriate affect Breasts: The right breast is status post mastectomy.  The incision is very irregular.  I do not see any obvious change.  Left breast is benign.   Right Chest Wall 07/02/2019:      LAB RESULTS: CMP Latest Ref Rng & Units 07/17/2019 07/10/2019 07/09/2019  Glucose 70 - 99 mg/dL 96 94 104(H)  BUN 6 - 20 mg/dL 7 12 12   Creatinine 0.44 - 1.00 mg/dL 0.81 0.84 0.77  Sodium 135 - 145 mmol/L 140 143 141  Potassium 3.5 - 5.1 mmol/L 3.5 3.8 3.3(L)  Chloride 98 - 111 mmol/L 102 107 108  CO2 22 - 32 mmol/L 25 26 24   Calcium 8.9 - 10.3 mg/dL 9.4 9.8 9.3  Total Protein 6.5 - 8.1 g/dL 7.7 8.1 -  Total Bilirubin 0.3 - 1.2 mg/dL 0.3 0.3 -  Alkaline Phos 38 - 126 U/L 73 74 -  AST 15 - 41 U/L 16 13(L) -  ALT 0 - 44 U/L 20 10 -     CBC    Component Value Date/Time   WBC 5.3 07/31/2019 1036   RBC 3.37 (L) 07/31/2019 1036   HGB 9.2 (L) 07/31/2019 1036   HGB 11.1 (L) 06/04/2019 1218   HGB 10.4 (L) 10/04/2016 1154   HCT 27.8 (L) 07/31/2019 1036   HCT 31.3 (L) 10/04/2016 1154   PLT 365 07/31/2019 1036   PLT 243 06/04/2019 1218   PLT 320 10/04/2016 1154   MCV 82.5 07/31/2019 1036   MCV 95.7 10/04/2016 1154   MCH 27.3 07/31/2019 1036   MCHC 33.1 07/31/2019 1036   RDW 14.6 07/31/2019 1036   RDW 16.3 (H) 10/04/2016 1154   LYMPHSABS PENDING 07/31/2019 1036   LYMPHSABS 1.7 10/04/2016 1154   MONOABS PENDING 07/31/2019 1036   MONOABS 0.5 10/04/2016 1154   EOSABS PENDING 07/31/2019 1036   EOSABS 0.3 10/04/2016 1154   BASOSABS  PENDING 07/31/2019 1036   BASOSABS 0.0 10/04/2016 1154    STUDIES: Dg Chest Port 1 View  Result Date: 07/09/2019 CLINICAL DATA:  Status post Port-A-Cath placement today. EXAM: PORTABLE CHEST 1 VIEW COMPARISON:  PA and lateral chest 05/19/2012. FINDINGS: Port-A-Cath from a left subclavian approach is in place. The tip projects in the right atrium. Lungs are clear. No pneumothorax. Heart size is normal. No acute bony abnormality. IMPRESSION: Port-A-Cath tip projects in the right atrium. Negative for pneumothorax. Electronically Signed   By: Inge Rise M.D.   On: 07/09/2019 09:07   Dg Fluoro Guide Cv Line-no Report  Result Date: 07/09/2019 Fluoroscopy was utilized by the requesting physician.  No radiographic interpretation.    RESEARCH: Referred to PREVENT study, but was pregnant at the time; referred to [facility] 11202, but the biopsied lymph node was benign; referred to weight loss study but declined; referred to [facility] 12/09/2000, but the timing of radiation and 8 was greater than 60 days past the date of diagnosis; referred to health disparity study, but declined; referred to [place] therapy  study, but the patient received Xeloda with radiation and therefore was ineligible   ASSESSMENT: 43 y.o. [place] woman status post right breast upper-outer quadrant biopsy 01/28/2016 for a clinical T2 N0 invasive ductal carcinoma, grade 3, triple negative, with an MIB-1 of 70%.  (a) suspicious right axillary lymph node biopsied 01/28/2016 was benign  (1) neoadjuvant chemotherapy: doxorubicin and cyclophosphamide in dose dense fashion 4 started 04/14/16, completed 05/26/2016, followed by paclitaxel and carboplatin weekly 12, Started 06/09/2016  (a) taxol discontinued after 7 doses because of neuropathy, last dose 07/21/2016  (2) genetics testing October 20, 2016 through the 32-gene Comprehensive Cancer Panel offered by [facility] Junius Roads, MD) (with MSH2 Exons 1-7  Inversion Analysis) found no deleterious mutations or VUSS  In APC, ATM, AXIN2, BARD1, BMPR1A, BRCA1, BRCA2, BRIP1, CDH1, CDK4, CDKN2A, CHEK2, EPCAM, FANCC, MLH1, MSH2, MSH6, MUTYH, NBN, PALB2, PMS2, POLD1, POLE, PTEN, RAD51C, RAD51D, SCG5/GREM1, SMAD4, STK11, TP53, VHL, and XRCC2.    (3) right mastectomy and sentinel lymph node sampling 09/06/2016 showed a residual ypT1c ypN0, invasive ductal carcinoma, grade 3, with negative margins. Repeat prognostic panel again triple negative   (4) adjuvant radiation with capecitabine/Xeloda sensitization 11/15/16 - 01/12/17 Site/dose:   Right Chest Wall and axilla (4 field) treated to 45 Gy in 25 fractions, and then Boosted an additional 14.4 Gy in 8 fractions.  (5) tobacco abuse disorder: The patient quit smoking 04/04/2018  METASTATIC DISEASE:  July 2020 (6) nonspecific changes noted on chest CT 06/11/2019 were clarified by PET scan 06/19/2019 showing hypermetabolic disease in the right anterior chest wall, right internal mammary nodes, right and left axillary nodes, but no metastatic disease in the neck, lungs, abdomen or pelvis.  Bone marrow uptake suggests bony metastatic disease.  (a) CARIS requested obtained from 06/28/2019 sample confirmed a triple negativity, the tumor also was negative for the androgen receptor, was MSI stable and mismatch repair status proficient, with a low mutational burden.  BRCA 1 and 2 were negative and PD-L1 was negative.  PI K3 showed a variant of uncertain significance.  However the tumor was genomic LOH high  (b) CA-27-29 is informative: was 118.3 on 06/04/2019  (7) zoledronate started 07/17/2019  (8) carboplatin/ gemcitabine days 1 and 8 Q21 day cycle started 07/10/2019   PLAN: Emmilyn did well with her first cycle of chemotherapy.  She will receive the second cycle beginning today.  She does not think we need to see her on the day 8 treatment so she will simply come in and get treated.  We will see her with the beginning  of cycle 3.  At that time we will decide whether to repeat a PET scan before cycle 4 or after cycle 4.  I have asked her to call us with any changes that she notices she develops chills fever rash or any other issue she should let us know.     [name]. Kacey Vicuna, MD  07/31/19 10:55 AM Medical Oncology and Hematology [facility] [address] [place], [place] [address] Tel. [phone]    Fax. [phone]   I, Jacqualyn Posey am acting as a Education administrator for Chauncey Cruel, MD.   I, Lurline Del MD, have reviewed the above documentation for accuracy and completeness, and I agree with the above.

## 2019-07-31 ENCOUNTER — Other Ambulatory Visit: Payer: Self-pay

## 2019-07-31 ENCOUNTER — Inpatient Hospital Stay (HOSPITAL_BASED_OUTPATIENT_CLINIC_OR_DEPARTMENT_OTHER): Payer: Medicaid Other | Admitting: Oncology

## 2019-07-31 ENCOUNTER — Inpatient Hospital Stay: Payer: Medicaid Other

## 2019-07-31 VITALS — BP 119/73 | HR 86 | Temp 98.3°F | Resp 18 | Ht 61.5 in | Wt 208.3 lb

## 2019-07-31 DIAGNOSIS — Z5111 Encounter for antineoplastic chemotherapy: Secondary | ICD-10-CM | POA: Diagnosis not present

## 2019-07-31 DIAGNOSIS — Z171 Estrogen receptor negative status [ER-]: Secondary | ICD-10-CM

## 2019-07-31 DIAGNOSIS — C50919 Malignant neoplasm of unspecified site of unspecified female breast: Secondary | ICD-10-CM

## 2019-07-31 DIAGNOSIS — C7951 Secondary malignant neoplasm of bone: Secondary | ICD-10-CM | POA: Diagnosis not present

## 2019-07-31 DIAGNOSIS — C50411 Malignant neoplasm of upper-outer quadrant of right female breast: Secondary | ICD-10-CM

## 2019-07-31 DIAGNOSIS — Z95828 Presence of other vascular implants and grafts: Secondary | ICD-10-CM

## 2019-07-31 LAB — CBC WITH DIFFERENTIAL/PLATELET
Abs Immature Granulocytes: 0.03 10*3/uL (ref 0.00–0.07)
Basophils Absolute: 0 10*3/uL (ref 0.0–0.1)
Basophils Relative: 0 %
Eosinophils Absolute: 0.1 10*3/uL (ref 0.0–0.5)
Eosinophils Relative: 1 %
HCT: 27.8 % — ABNORMAL LOW (ref 36.0–46.0)
Hemoglobin: 9.2 g/dL — ABNORMAL LOW (ref 12.0–15.0)
Immature Granulocytes: 1 %
Lymphocytes Relative: 48 %
Lymphs Abs: 2.5 10*3/uL (ref 0.7–4.0)
MCH: 27.3 pg (ref 26.0–34.0)
MCHC: 33.1 g/dL (ref 30.0–36.0)
MCV: 82.5 fL (ref 80.0–100.0)
Monocytes Absolute: 1 10*3/uL (ref 0.1–1.0)
Monocytes Relative: 19 %
Neutro Abs: 1.6 10*3/uL — ABNORMAL LOW (ref 1.7–7.7)
Neutrophils Relative %: 31 %
Platelets: 365 10*3/uL (ref 150–400)
RBC: 3.37 MIL/uL — ABNORMAL LOW (ref 3.87–5.11)
RDW: 14.6 % (ref 11.5–15.5)
WBC: 5.3 10*3/uL (ref 4.0–10.5)
nRBC: 0 % (ref 0.0–0.2)

## 2019-07-31 LAB — COMPREHENSIVE METABOLIC PANEL
ALT: 26 U/L (ref 0–44)
AST: 25 U/L (ref 15–41)
Albumin: 3.4 g/dL — ABNORMAL LOW (ref 3.5–5.0)
Alkaline Phosphatase: 68 U/L (ref 38–126)
Anion gap: 9 (ref 5–15)
BUN: 8 mg/dL (ref 6–20)
CO2: 25 mmol/L (ref 22–32)
Calcium: 9.3 mg/dL (ref 8.9–10.3)
Chloride: 108 mmol/L (ref 98–111)
Creatinine, Ser: 0.75 mg/dL (ref 0.44–1.00)
GFR calc Af Amer: >60 mL/min (ref >60)
GFR calc non Af Amer: >60 mL/min (ref >60)
Glucose, Bld: 100 mg/dL — ABNORMAL HIGH (ref 70–99)
Potassium: 3.6 mmol/L (ref 3.5–5.1)
Sodium: 142 mmol/L (ref 135–145)
Total Bilirubin: <0.2 mg/dL — ABNORMAL LOW (ref 0.3–1.2)
Total Protein: 7.2 g/dL (ref 6.5–8.1)

## 2019-07-31 MED ORDER — SODIUM CHLORIDE 0.9% FLUSH
10.0000 mL | INTRAVENOUS | Status: DC | PRN
  Administered 2019-07-31: 10 mL
  Filled 2019-07-31: qty 10

## 2019-07-31 MED ORDER — SODIUM CHLORIDE 0.9 % IV SOLN
1000.0000 mg/m2 | Freq: Once | INTRAVENOUS | Status: AC
  Administered 2019-07-31: 2052 mg via INTRAVENOUS
  Filled 2019-07-31: qty 53.97

## 2019-07-31 MED ORDER — DEXAMETHASONE SODIUM PHOSPHATE 10 MG/ML IJ SOLN
10.0000 mg | Freq: Once | INTRAMUSCULAR | Status: AC
  Administered 2019-07-31: 13:00:00 10 mg via INTRAVENOUS

## 2019-07-31 MED ORDER — SODIUM CHLORIDE 0.9% FLUSH
10.0000 mL | INTRAVENOUS | Status: DC | PRN
  Administered 2019-07-31: 10 mL via INTRAVENOUS
  Filled 2019-07-31: qty 10

## 2019-07-31 MED ORDER — HEPARIN SOD (PORK) LOCK FLUSH 100 UNIT/ML IV SOLN
500.0000 [IU] | Freq: Once | INTRAVENOUS | Status: AC | PRN
  Administered 2019-07-31: 500 [IU]
  Filled 2019-07-31: qty 5

## 2019-07-31 MED ORDER — PALONOSETRON HCL INJECTION 0.25 MG/5ML
INTRAVENOUS | Status: AC
  Filled 2019-07-31: qty 5

## 2019-07-31 MED ORDER — SODIUM CHLORIDE 0.9 % IV SOLN
300.0000 mg | Freq: Once | INTRAVENOUS | Status: AC
  Administered 2019-07-31: 300 mg via INTRAVENOUS
  Filled 2019-07-31: qty 30

## 2019-07-31 MED ORDER — DEXAMETHASONE SODIUM PHOSPHATE 10 MG/ML IJ SOLN
INTRAMUSCULAR | Status: AC
  Filled 2019-07-31: qty 1

## 2019-07-31 MED ORDER — SODIUM CHLORIDE 0.9 % IV SOLN
Freq: Once | INTRAVENOUS | Status: AC
  Administered 2019-07-31: 13:00:00 via INTRAVENOUS
  Filled 2019-07-31: qty 250

## 2019-07-31 MED ORDER — PALONOSETRON HCL INJECTION 0.25 MG/5ML
0.2500 mg | Freq: Once | INTRAVENOUS | Status: AC
  Administered 2019-07-31: 0.25 mg via INTRAVENOUS

## 2019-07-31 NOTE — Patient Instructions (Signed)
[facility] Discharge Instructions for Patients Receiving Chemotherapy  Today you received the following chemotherapy agents: Gemcitabine (Gemzar) and Carboplatin (Paraplatin)  To help prevent nausea and vomiting after your treatment, we encourage you to take your nausea medication as directed.   If you develop nausea and vomiting that is not controlled by your nausea medication, call the clinic.   BELOW ARE SYMPTOMS THAT SHOULD BE REPORTED IMMEDIATELY:  *FEVER GREATER THAN 100.5 F  *CHILLS WITH OR WITHOUT FEVER  NAUSEA AND VOMITING THAT IS NOT CONTROLLED WITH YOUR NAUSEA MEDICATION  *UNUSUAL SHORTNESS OF BREATH  *UNUSUAL BRUISING OR BLEEDING  TENDERNESS IN MOUTH AND THROAT WITH OR WITHOUT PRESENCE OF ULCERS  *URINARY PROBLEMS  *BOWEL PROBLEMS  UNUSUAL RASH Items with * indicate a potential emergency and should be followed up as soon as possible.  Feel free to call the clinic should you have any questions or concerns. The clinic phone number is [phone].  Please show the [facility] at check-in to the Emergency Department and triage nurse.  Coronavirus (COVID-19) Are you at risk?  Are you at risk for the Coronavirus (COVID-19)?  To be considered HIGH RISK for Coronavirus (COVID-19), you have to meet the following criteria:  . Traveled to [place], [place], [place], [place] or [place]; or in the [place] to [place], [place], [place], or [place]; and have fever, cough, and shortness of breath within the last 2 weeks of travel OR . Been in close contact with a person diagnosed with COVID-19 within the last 2 weeks and have fever, cough, and shortness of breath . IF YOU DO NOT MEET THESE CRITERIA, YOU ARE CONSIDERED LOW RISK FOR COVID-19.  What to do if you are HIGH RISK for COVID-19?  Marland Kitchen If you are having a medical emergency, call 911. . Seek medical care right away. Before you go to a doctor's office, urgent care or emergency department,  call ahead and tell them about your recent travel, contact with someone diagnosed with COVID-19, and your symptoms. You should receive instructions from your physician's office regarding next steps of care.  . When you arrive at healthcare provider, tell the healthcare staff immediately you have returned from visiting [place], [place], [place], [place] or [place]; or traveled in the [place] to [place], [place], [place], or [place]; in the last two weeks or you have been in close contact with a person diagnosed with COVID-19 in the last 2 weeks.   . Tell the health care staff about your symptoms: fever, cough and shortness of breath. . After you have been seen by a medical provider, you will be either: o Tested for (COVID-19) and discharged home on quarantine except to seek medical care if symptoms worsen, and asked to  - Stay home and avoid contact with others until you get your results (4-5 days)  - Avoid travel on public transportation if possible (such as bus, train, or airplane) or o Sent to the Emergency Department by EMS for evaluation, COVID-19 testing, and possible admission depending on your condition and test results.  What to do if you are LOW RISK for COVID-19?  Reduce your risk of any infection by using the same precautions used for avoiding the common cold or flu:  Marland Kitchen Wash your hands often with soap and warm water for at least 20 seconds.  If soap and water are not readily available, use an alcohol-based hand sanitizer with at least 60% alcohol.  Marland Kitchen  If coughing or sneezing, cover your mouth and nose by coughing or sneezing into the elbow areas of your shirt or coat, into a tissue or into your sleeve (not your hands). . Avoid shaking hands with others and consider head nods or verbal greetings only. . Avoid touching your eyes, nose, or mouth with unwashed hands.  . Avoid close contact with people who are sick. . Avoid places or events with large numbers of people in one  location, like concerts or sporting events. . Carefully consider travel plans you have or are making. . If you are planning any travel outside or inside the [place], visit the CDC's Travelers' Health webpage for the latest health notices. . If you have some symptoms but not all symptoms, continue to monitor at home and seek medical attention if your symptoms worsen. . If you are having a medical emergency, call 911.   ADDITIONAL HEALTHCARE OPTIONS FOR PATIENTS  [facility] / e-Visit: [email or web address]         [facility]: [phone]  [facility]: [phone]                   [facility]: [phone]   

## 2019-08-01 LAB — CANCER ANTIGEN 27.29: CA 27.29: 107.2 U/mL — ABNORMAL HIGH (ref 0.0–38.6)

## 2019-08-07 ENCOUNTER — Inpatient Hospital Stay: Payer: Medicaid Other

## 2019-08-07 ENCOUNTER — Inpatient Hospital Stay: Payer: Medicaid Other | Attending: Oncology

## 2019-08-07 ENCOUNTER — Other Ambulatory Visit: Payer: Self-pay

## 2019-08-07 VITALS — BP 116/85 | HR 93 | Temp 99.0°F | Resp 18

## 2019-08-07 DIAGNOSIS — Z79899 Other long term (current) drug therapy: Secondary | ICD-10-CM | POA: Diagnosis not present

## 2019-08-07 DIAGNOSIS — Z87891 Personal history of nicotine dependence: Secondary | ICD-10-CM | POA: Diagnosis not present

## 2019-08-07 DIAGNOSIS — E669 Obesity, unspecified: Secondary | ICD-10-CM | POA: Diagnosis not present

## 2019-08-07 DIAGNOSIS — K219 Gastro-esophageal reflux disease without esophagitis: Secondary | ICD-10-CM | POA: Insufficient documentation

## 2019-08-07 DIAGNOSIS — I10 Essential (primary) hypertension: Secondary | ICD-10-CM | POA: Insufficient documentation

## 2019-08-07 DIAGNOSIS — Z5111 Encounter for antineoplastic chemotherapy: Secondary | ICD-10-CM | POA: Diagnosis present

## 2019-08-07 DIAGNOSIS — D573 Sickle-cell trait: Secondary | ICD-10-CM | POA: Insufficient documentation

## 2019-08-07 DIAGNOSIS — Z809 Family history of malignant neoplasm, unspecified: Secondary | ICD-10-CM | POA: Diagnosis not present

## 2019-08-07 DIAGNOSIS — Z171 Estrogen receptor negative status [ER-]: Secondary | ICD-10-CM | POA: Diagnosis not present

## 2019-08-07 DIAGNOSIS — C50919 Malignant neoplasm of unspecified site of unspecified female breast: Secondary | ICD-10-CM

## 2019-08-07 DIAGNOSIS — C7951 Secondary malignant neoplasm of bone: Secondary | ICD-10-CM

## 2019-08-07 DIAGNOSIS — C50411 Malignant neoplasm of upper-outer quadrant of right female breast: Secondary | ICD-10-CM | POA: Diagnosis not present

## 2019-08-07 DIAGNOSIS — Z923 Personal history of irradiation: Secondary | ICD-10-CM | POA: Diagnosis not present

## 2019-08-07 DIAGNOSIS — Z9011 Acquired absence of right breast and nipple: Secondary | ICD-10-CM | POA: Diagnosis not present

## 2019-08-07 DIAGNOSIS — Z803 Family history of malignant neoplasm of breast: Secondary | ICD-10-CM | POA: Diagnosis not present

## 2019-08-07 DIAGNOSIS — F418 Other specified anxiety disorders: Secondary | ICD-10-CM | POA: Diagnosis not present

## 2019-08-07 DIAGNOSIS — Z95828 Presence of other vascular implants and grafts: Secondary | ICD-10-CM

## 2019-08-07 DIAGNOSIS — Z801 Family history of malignant neoplasm of trachea, bronchus and lung: Secondary | ICD-10-CM | POA: Insufficient documentation

## 2019-08-07 LAB — CBC WITH DIFFERENTIAL/PLATELET
Abs Immature Granulocytes: 0.07 10*3/uL (ref 0.00–0.07)
Basophils Absolute: 0 10*3/uL (ref 0.0–0.1)
Basophils Relative: 1 %
Eosinophils Absolute: 0 10*3/uL (ref 0.0–0.5)
Eosinophils Relative: 0 %
HCT: 28.5 % — ABNORMAL LOW (ref 36.0–46.0)
Hemoglobin: 9.5 g/dL — ABNORMAL LOW (ref 12.0–15.0)
Immature Granulocytes: 1 %
Lymphocytes Relative: 40 %
Lymphs Abs: 2.9 10*3/uL (ref 0.7–4.0)
MCH: 28 pg (ref 26.0–34.0)
MCHC: 33.3 g/dL (ref 30.0–36.0)
MCV: 84.1 fL (ref 80.0–100.0)
Monocytes Absolute: 0.3 10*3/uL (ref 0.1–1.0)
Monocytes Relative: 5 %
Neutro Abs: 3.9 10*3/uL (ref 1.7–7.7)
Neutrophils Relative %: 53 %
Platelets: 317 10*3/uL (ref 150–400)
RBC: 3.39 MIL/uL — ABNORMAL LOW (ref 3.87–5.11)
RDW: 15.2 % (ref 11.5–15.5)
WBC: 7.3 10*3/uL (ref 4.0–10.5)
nRBC: 0 % (ref 0.0–0.2)

## 2019-08-07 LAB — COMPREHENSIVE METABOLIC PANEL
ALT: 46 U/L — ABNORMAL HIGH (ref 0–44)
AST: 29 U/L (ref 15–41)
Albumin: 3.7 g/dL (ref 3.5–5.0)
Alkaline Phosphatase: 84 U/L (ref 38–126)
Anion gap: 9 (ref 5–15)
BUN: 7 mg/dL (ref 6–20)
CO2: 26 mmol/L (ref 22–32)
Calcium: 8.9 mg/dL (ref 8.9–10.3)
Chloride: 108 mmol/L (ref 98–111)
Creatinine, Ser: 0.73 mg/dL (ref 0.44–1.00)
GFR calc Af Amer: >60 mL/min (ref >60)
GFR calc non Af Amer: >60 mL/min (ref >60)
Glucose, Bld: 104 mg/dL — ABNORMAL HIGH (ref 70–99)
Potassium: 3.8 mmol/L (ref 3.5–5.1)
Sodium: 143 mmol/L (ref 135–145)
Total Bilirubin: <0.2 mg/dL — ABNORMAL LOW (ref 0.3–1.2)
Total Protein: 7.3 g/dL (ref 6.5–8.1)

## 2019-08-07 MED ORDER — SODIUM CHLORIDE 0.9% FLUSH
10.0000 mL | INTRAVENOUS | Status: DC | PRN
  Filled 2019-08-07: qty 10

## 2019-08-07 MED ORDER — SODIUM CHLORIDE 0.9% FLUSH
10.0000 mL | INTRAVENOUS | Status: DC | PRN
  Administered 2019-08-07: 13:00:00 10 mL via INTRAVENOUS
  Filled 2019-08-07: qty 10

## 2019-08-07 MED ORDER — HEPARIN SOD (PORK) LOCK FLUSH 100 UNIT/ML IV SOLN
500.0000 [IU] | Freq: Once | INTRAVENOUS | Status: AC | PRN
  Administered 2019-08-07: 16:00:00 500 [IU]
  Filled 2019-08-07: qty 5

## 2019-08-07 MED ORDER — SODIUM CHLORIDE 0.9 % IV SOLN
1000.0000 mg/m2 | Freq: Once | INTRAVENOUS | Status: AC
  Administered 2019-08-07: 2052 mg via INTRAVENOUS
  Filled 2019-08-07: qty 53.97

## 2019-08-07 MED ORDER — DEXAMETHASONE SODIUM PHOSPHATE 10 MG/ML IJ SOLN
10.0000 mg | Freq: Once | INTRAMUSCULAR | Status: AC
  Administered 2019-08-07: 10 mg via INTRAVENOUS

## 2019-08-07 MED ORDER — DEXAMETHASONE SODIUM PHOSPHATE 10 MG/ML IJ SOLN
INTRAMUSCULAR | Status: AC
  Filled 2019-08-07: qty 1

## 2019-08-07 MED ORDER — SODIUM CHLORIDE 0.9 % IV SOLN
Freq: Once | INTRAVENOUS | Status: AC
  Administered 2019-08-07: 14:00:00 via INTRAVENOUS
  Filled 2019-08-07: qty 250

## 2019-08-07 MED ORDER — SODIUM CHLORIDE 0.9 % IV SOLN
300.0000 mg | Freq: Once | INTRAVENOUS | Status: AC
  Administered 2019-08-07: 300 mg via INTRAVENOUS
  Filled 2019-08-07: qty 30

## 2019-08-07 MED ORDER — SODIUM CHLORIDE 0.9% FLUSH
10.0000 mL | INTRAVENOUS | Status: DC | PRN
  Administered 2019-08-07: 10 mL
  Filled 2019-08-07: qty 10

## 2019-08-07 MED ORDER — PALONOSETRON HCL INJECTION 0.25 MG/5ML
INTRAVENOUS | Status: AC
  Filled 2019-08-07: qty 5

## 2019-08-07 MED ORDER — PALONOSETRON HCL INJECTION 0.25 MG/5ML
0.2500 mg | Freq: Once | INTRAVENOUS | Status: AC
  Administered 2019-08-07: 0.25 mg via INTRAVENOUS

## 2019-08-07 NOTE — Patient Instructions (Signed)
[facility] Discharge Instructions for Patients Receiving Chemotherapy  Today you received the following chemotherapy agents :  Gemcitabine, Carboplatin.  To help prevent nausea and vomiting after your treatment, we encourage you to take your nausea medication as prescribed.   If you develop nausea and vomiting that is not controlled by your nausea medication, call the clinic.   BELOW ARE SYMPTOMS THAT SHOULD BE REPORTED IMMEDIATELY:  *FEVER GREATER THAN 100.5 F  *CHILLS WITH OR WITHOUT FEVER  NAUSEA AND VOMITING THAT IS NOT CONTROLLED WITH YOUR NAUSEA MEDICATION  *UNUSUAL SHORTNESS OF BREATH  *UNUSUAL BRUISING OR BLEEDING  TENDERNESS IN MOUTH AND THROAT WITH OR WITHOUT PRESENCE OF ULCERS  *URINARY PROBLEMS  *BOWEL PROBLEMS  UNUSUAL RASH Items with * indicate a potential emergency and should be followed up as soon as possible.  Feel free to call the clinic should you have any questions or concerns. The clinic phone number is [phone].  Please show the CHEMO ALERT CARD at check-in to the Emergency Department and triage nurse.   

## 2019-08-21 ENCOUNTER — Inpatient Hospital Stay: Payer: Medicaid Other

## 2019-08-21 ENCOUNTER — Other Ambulatory Visit: Payer: Self-pay

## 2019-08-21 ENCOUNTER — Encounter: Payer: Self-pay | Admitting: Adult Health

## 2019-08-21 ENCOUNTER — Inpatient Hospital Stay (HOSPITAL_BASED_OUTPATIENT_CLINIC_OR_DEPARTMENT_OTHER): Payer: Medicaid Other | Admitting: Adult Health

## 2019-08-21 VITALS — BP 124/81 | HR 84 | Temp 97.8°F | Resp 18 | Ht 61.5 in | Wt 208.7 lb

## 2019-08-21 DIAGNOSIS — C50411 Malignant neoplasm of upper-outer quadrant of right female breast: Secondary | ICD-10-CM

## 2019-08-21 DIAGNOSIS — C7951 Secondary malignant neoplasm of bone: Secondary | ICD-10-CM

## 2019-08-21 DIAGNOSIS — Z171 Estrogen receptor negative status [ER-]: Secondary | ICD-10-CM

## 2019-08-21 DIAGNOSIS — C50919 Malignant neoplasm of unspecified site of unspecified female breast: Secondary | ICD-10-CM

## 2019-08-21 DIAGNOSIS — Z5111 Encounter for antineoplastic chemotherapy: Secondary | ICD-10-CM | POA: Diagnosis not present

## 2019-08-21 LAB — COMPREHENSIVE METABOLIC PANEL
ALT: 23 U/L (ref 0–44)
AST: 22 U/L (ref 15–41)
Albumin: 4 g/dL (ref 3.5–5.0)
Alkaline Phosphatase: 65 U/L (ref 38–126)
Anion gap: 7 (ref 5–15)
BUN: 6 mg/dL (ref 6–20)
CO2: 28 mmol/L (ref 22–32)
Calcium: 8.5 mg/dL — ABNORMAL LOW (ref 8.9–10.3)
Chloride: 107 mmol/L (ref 98–111)
Creatinine, Ser: 0.77 mg/dL (ref 0.44–1.00)
GFR calc Af Amer: >60 mL/min (ref >60)
GFR calc non Af Amer: >60 mL/min (ref >60)
Glucose, Bld: 91 mg/dL (ref 70–99)
Potassium: 3.7 mmol/L (ref 3.5–5.1)
Sodium: 142 mmol/L (ref 135–145)
Total Bilirubin: <0.2 mg/dL — ABNORMAL LOW (ref 0.3–1.2)
Total Protein: 7.3 g/dL (ref 6.5–8.1)

## 2019-08-21 LAB — CBC WITH DIFFERENTIAL/PLATELET
Abs Immature Granulocytes: 0.01 10*3/uL (ref 0.00–0.07)
Basophils Absolute: 0 10*3/uL (ref 0.0–0.1)
Basophils Relative: 0 %
Eosinophils Absolute: 0.1 10*3/uL (ref 0.0–0.5)
Eosinophils Relative: 1 %
HCT: 26.1 % — ABNORMAL LOW (ref 36.0–46.0)
Hemoglobin: 8.6 g/dL — ABNORMAL LOW (ref 12.0–15.0)
Immature Granulocytes: 0 %
Lymphocytes Relative: 34 %
Lymphs Abs: 2.4 10*3/uL (ref 0.7–4.0)
MCH: 27.9 pg (ref 26.0–34.0)
MCHC: 33 g/dL (ref 30.0–36.0)
MCV: 84.7 fL (ref 80.0–100.0)
Monocytes Absolute: 0.8 10*3/uL (ref 0.1–1.0)
Monocytes Relative: 11 %
Neutro Abs: 3.9 10*3/uL (ref 1.7–7.7)
Neutrophils Relative %: 54 %
Platelets: 192 10*3/uL (ref 150–400)
RBC: 3.08 MIL/uL — ABNORMAL LOW (ref 3.87–5.11)
RDW: 16.5 % — ABNORMAL HIGH (ref 11.5–15.5)
WBC: 7.2 10*3/uL (ref 4.0–10.5)
nRBC: 0 % (ref 0.0–0.2)

## 2019-08-21 MED ORDER — SODIUM CHLORIDE 0.9 % IV SOLN
1000.0000 mg/m2 | Freq: Once | INTRAVENOUS | Status: AC
  Administered 2019-08-21: 2052 mg via INTRAVENOUS
  Filled 2019-08-21: qty 53.97

## 2019-08-21 MED ORDER — SODIUM CHLORIDE 0.9% FLUSH
10.0000 mL | INTRAVENOUS | Status: DC | PRN
  Administered 2019-08-21: 10 mL
  Filled 2019-08-21: qty 10

## 2019-08-21 MED ORDER — SODIUM CHLORIDE 0.9 % IV SOLN
Freq: Once | INTRAVENOUS | Status: AC
  Administered 2019-08-21: 12:00:00 via INTRAVENOUS
  Filled 2019-08-21: qty 250

## 2019-08-21 MED ORDER — SODIUM CHLORIDE 0.9 % IV SOLN
300.0000 mg | Freq: Once | INTRAVENOUS | Status: AC
  Administered 2019-08-21: 300 mg via INTRAVENOUS
  Filled 2019-08-21: qty 30

## 2019-08-21 MED ORDER — HEPARIN SOD (PORK) LOCK FLUSH 100 UNIT/ML IV SOLN
500.0000 [IU] | Freq: Once | INTRAVENOUS | Status: AC | PRN
  Administered 2019-08-21: 500 [IU]
  Filled 2019-08-21: qty 5

## 2019-08-21 MED ORDER — DEXAMETHASONE SODIUM PHOSPHATE 10 MG/ML IJ SOLN
10.0000 mg | Freq: Once | INTRAMUSCULAR | Status: AC
  Administered 2019-08-21: 12:00:00 10 mg via INTRAVENOUS

## 2019-08-21 MED ORDER — SODIUM CHLORIDE 0.9% FLUSH
10.0000 mL | INTRAVENOUS | Status: DC | PRN
  Administered 2019-08-21: 14:00:00 10 mL
  Filled 2019-08-21: qty 10

## 2019-08-21 MED ORDER — DEXAMETHASONE SODIUM PHOSPHATE 10 MG/ML IJ SOLN
INTRAMUSCULAR | Status: AC
  Filled 2019-08-21: qty 1

## 2019-08-21 MED ORDER — LORAZEPAM 0.5 MG PO TABS: 0.5000 mg | ORAL_TABLET | Freq: Every day | ORAL | 0 refills | Status: DC | PRN

## 2019-08-21 MED ORDER — PALONOSETRON HCL INJECTION 0.25 MG/5ML
INTRAVENOUS | Status: AC
  Filled 2019-08-21: qty 5

## 2019-08-21 MED ORDER — PALONOSETRON HCL INJECTION 0.25 MG/5ML
0.2500 mg | Freq: Once | INTRAVENOUS | Status: AC
  Administered 2019-08-21: 0.25 mg via INTRAVENOUS

## 2019-08-21 MED ORDER — TRAMADOL HCL 50 MG PO TABS: 50.0000 mg | ORAL_TABLET | Freq: Four times a day (QID) | ORAL | 0 refills | Status: DC | PRN

## 2019-08-21 NOTE — Progress Notes (Signed)
[place]  [phone] [phone]   ID: Rebecca [name] DOB: 03-29-1976  MR#: [id number]  EHM#:[id number]  Patient Care Team: Dorena Dew, FNP as PCP - General (Family Medicine) Alphonsa Overall, MD as Consulting Physician (General Surgery) Magrinat, [name], MD as Consulting Physician (Oncology) Gery Pray, MD as Consulting Physician (Radiation Oncology) Delice Bison, Charlestine Massed, NP as Nurse Practitioner (Hematology and Oncology) Alda Berthold, DO as Consulting Physician (Neurology) OTHER MD:  CHIEF COMPLAINT: triple negative stage IV breast cancer  CURRENT TREATMENT: carboplatin/ gemcitabine; zoledronate   INTERVAL HISTORY: Rebecca [name] returns today for follow-up and treatment of her recurrent triple negative breast cancer. She was last seen here on 07/17/2019.   She continues on carboplatin and gemcitabine.  Today is day 1 cycle 3.  She receives treatment on days 1 and 8 of each 21-day cycle.  She also continues on zolendronate.  Her most recent dose was 07/17/2019 and she receives this every 12 weeks.  REVIEW OF SYSTEMS: Rebecca [name] needs a refill on tramadol.  She has pain inher shoulders and upper back and this helps with that.  She needs this about once a day.  She says it hasn't caused constipation or increased sleepiness.  Last fill on 8/12.  She is taking miralax and stool softeners to help with having bowel movements.  She also notes some anxiety and is taking Lorazepam PRN.    Rebecca [name] has no new pain.  She notes her fatigue is slightly worse.  She notes some swelling in her left supraclavicular area.  There is no nodularity there she has felt.  She has no new lesions, or other concerns.  Rebecca [name] is without nausea, vomiting, bowel/bladder changes.  She has no headaches, vision changes, chest pain, palpitations, cough, or shortness of breath.  A detailed ROS was otherwise non contributory.    BREAST CANCER HISTORY: From the original intake note:   [place] herself noted a change in her right breast sometime around September or October 2016. She did not bring it to intermediate medical attention, but on 01/13/2016 she established herself in Dr. Smith Robert' service and she was set up for bilateral diagnostic mammography with tomosynthesis and bilateral ultrasonography at the [facility] 01/19/2016. The breast density was category B. In the upper outer quadrant of the right breast there was a spiculated mass measuring 2.8 cm. On physical exam this was palpable. Targeted ultrasonography confirmed an irregular hypoechoic mass in the right breast 11:30 o'clock position measuring 2.6 cm maximally. Ultrasound of the right axilla showed a morphologically abnormal lymph node.  In the left breast there were some tubular densities behind the areola which by ultrasonography showed benign ductal ectasia.  On 01/28/2016 [facility] underwent biopsy of the right breast mass and abnormal right axillary lymph node. The pathology from this procedure (S [id number]) showed the lymph node to be benign. In the breast however there was an invasive ductal carcinoma, grade 3, which was estrogen and progesterone receptor negative. The proliferation marker was 70%. HER-2 was not amplified with a signals ratio of 1.32. The number per cell was 2.05.  The patient's subsequent history is as detailed below   PAST MEDICAL HISTORY: Past Medical History:  Diagnosis Date  . Anxiety   . Breast cancer ([place])   . Depression   . GERD (gastroesophageal reflux disease)   . History of radiation therapy 11/15/16-01/12/17   right chest wall and axilla treated to 45 Gy in 25 fractions, boosted and additional 14 Gy in 8 fractions  .  Hypertension    diet controlled  . Obesity (BMI 35.0-39.9 without comorbidity)   . Pneumonia    as a child  . Seasonal allergies   . Sickle cell trait ([place])   . Termination of pregnancy (fetus) 04/02/16    PAST SURGICAL HISTORY: Past Surgical History:   Procedure Laterality Date  . CESAREAN SECTION     2004 and 2007  . MASTECTOMY W/ SENTINEL NODE BIOPSY Right 09/06/2016   Procedure: RIGHT BREAST MASTECTOMY WITH RIGHT AXILLARY SENTINEL LYMPH NODE BIOPSY;  Surgeon: Alphonsa Overall, MD;  Location: [facility];  Service: General;  Laterality: Right;  . PORT-A-CATH REMOVAL Left 09/06/2016   Procedure: REMOVAL PORT-A-CATH;  Surgeon: Alphonsa Overall, MD;  Location: [facility];  Service: General;  Laterality: Left;  . PORTACATH PLACEMENT    . PORTACATH PLACEMENT N/A 07/09/2019   Procedure: INSERTION PORT-A-CATH WITH ULTRASOUND;  Surgeon: Alphonsa Overall, MD;  Location: [facility] OR;  Service: General;  Laterality: N/A;    FAMILY HISTORY Family History  Problem Relation Age of Onset  . Hypertension Mother   . Cancer Mother        dx "intestinal cancer" in her 12s; +surgery  . Other Mother        hysterectomy at young age for unspecified cause  . Heart Problems Mother   . Breast cancer Cousin        maternal 1st cousin dx female breast cancer at 84-46y  . Cancer Father   . Hypertension Father   . Heart Problems Maternal Aunt   . Diabetes Maternal Aunt   . Breast cancer Maternal Uncle        dx 64-65  . Heart Problems Maternal Uncle   . Breast cancer Maternal Grandmother 14  . Throat cancer Maternal Grandfather        d. 46s; smoker  . Sickle cell anemia Paternal Aunt   . Congestive Heart Failure Maternal Aunt   . Multiple sclerosis Cousin   . Cancer Other        maternal great uncle (MGM's brother); cancer removed from his side  . Heart attack Paternal Aunt        d. early 18s  The patient has very little information about her father. Her mother is currently 49 years old. She had a history of cervical cancer at age 46. The patient had 2 brothers, no sisters. The maternal grandfather had throat cancer. A maternal uncle was diagnosed with breast cancer as well as prostate cancer at the age of 1. 2 maternal cousins, one of the  mail, had breast cancer as well.   GYNECOLOGIC HISTORY:  No LMP recorded. Menarche age 33, first live birth age 30. The patient is GX P4. She was still having regular periods at the time of diagnosis. She took oral contraceptives in the 1990s with no side effects.--.  Stopped with chemotherapy and have not resumed as of May 2019   SOCIAL HISTORY:  (Updated 10/2018) She works as a Market researcher. The patient's significant other [name] works at [employer].  At home with the patient are her 3 children [name], [place] and [place]. There are age 28, 20, and 46 as of November 2019.  2 of them are disabled or have significant health problems, one with sickle cell disease, the other with autism and developmental delay.  The patient's son [name], currently 46 years old, lives in [place].    ADVANCED DIRECTIVES: Not in place  HEALTH MAINTENANCE: Social History   Socioeconomic History  . Marital status: Single    Spouse name: Not on file  . Number of children: 4  . Years of education: Not on file  . Highest education level: Not on file  Occupational History  . Occupation: Private Care Attendant    Comment: First choice [employer]  . Financial resource strain: Not on file  . Food insecurity    Worry: Never true    Inability: Never true  . Transportation needs    Medical: No    Non-medical: No  Tobacco Use  . Smoking status: Former Smoker    Packs/day: 1.00    Years: 20.00    Pack years: 20.00    Types: Cigarettes    Quit date: 04/01/2018    Years since quitting: 1.3  . Smokeless tobacco: Never Used  . Tobacco comment: Patient has quit smoking x 1 year now  Substance and Sexual Activity  . Alcohol use: Yes    Comment: occ  . Drug use: No  . Sexual activity: Not on file  Lifestyle  . Physical activity    Days per week: 7 days    Minutes per session: 60 min  . Stress: Not at all  Relationships  . Social  Herbalist on phone: Not on file    Gets together: Not on file    Attends religious service: Not on file    Active member of club or organization: Not on file    Attends meetings of clubs or organizations: Not on file    Relationship status: Not on file  Other Topics Concern  . Not on file  Social History Narrative  . Not on file     Colonoscopy:  PAP:  Bone density:  Lipid panel:  No Known Allergies  Current Outpatient Medications on File Prior to Visit  Medication Sig Dispense Refill  . calcium carbonate (TUMS - DOSED IN MG ELEMENTAL CALCIUM) 500 MG chewable tablet Chew 1 tablet by mouth daily as needed for indigestion or heartburn.    . dexamethasone (DECADRON) 4 MG tablet Take 2 tablets (8 mg total) by mouth daily. Start the day after chemotherapy for 2 days. Take with food. 30 tablet 1  . lidocaine-prilocaine (EMLA) cream Apply to affected area once 30 g 3  . omeprazole (PRILOSEC) 20 MG capsule Take 1 capsule (20 mg total) by mouth daily. 30 capsule 1  . prochlorperazine (COMPAZINE) 10 MG tablet Take 1 tablet (10 mg total) by mouth every 6 (six) hours as needed (Nausea or vomiting). 30 tablet 1  . traMADol (ULTRAM) 50 MG tablet Take 1 tablet (50 mg total) by mouth every 6 (six) hours as needed. 30 tablet 0   No current facility-administered medications on file prior to visit.     OBJECTIVE:   Vitals:   08/21/19 1123  BP: 124/81  Pulse: 84  Resp: 18  Temp: 97.8 F (36.6 C)  SpO2: 100%   Wt Readings from Last 3 Encounters:  08/21/19 208 lb 11.2 oz (94.7 kg)  07/31/19 208 lb 4.8 oz (94.5 kg)  07/17/19 208 lb (94.3 kg)   Body mass index is 38.79 kg/m.    ECOG FS:1 - Symptomatic but completely ambulatory GENERAL: Patient is a well appearing female in no acute distress HEENT:  Sclerae anicteric.  Oropharynx clear and moist. No ulcerations or evidence of oropharyngeal candidiasis. Neck is supple.  NODES:  No cervical, supraclavicular, or axillary  lymphadenopathy palpated. Mild swelling noted in left SCV noted however BREAST EXAM:  Deferred. LUNGS:  Clear to auscultation bilaterally.  No wheezes or rhonchi. HEART:  Regular rate and rhythm. No murmur appreciated. ABDOMEN:  Soft, nontender.  Positive, normoactive bowel sounds. No organomegaly palpated. MSK:  No focal spinal tenderness to palpation. Full range of motion bilaterally in the upper extremities. EXTREMITIES:  No peripheral edema.   SKIN:  Clear with no obvious rashes or skin changes. No nail dyscrasia. NEURO:  Nonfocal. Well oriented.  Appropriate affect.       LAB RESULTS: CMP Latest Ref Rng & Units 08/07/2019 07/31/2019 07/17/2019  Glucose 70 - 99 mg/dL 104(H) 100(H) 96  BUN 6 - 20 mg/dL _0 Creatinine 0.44 - 1.00 mg/dL 0.73 0.75 0.81  Sodium 135 - 145 mmol/L 143 142 140  Potassium 3.5 - 5.1 mmol/L 3.8 3.6 3.5  Chloride 98 - 111 mmol/L 108 108 102  CO2 22 - 32 mmol/L _1 Calcium 8.9 - 10.3 mg/dL 8.9 9.3 9.4  Total Protein 6.5 - 8.1 g/dL 7.3 7.2 7.7  Total Bilirubin 0.3 - 1.2 mg/dL <0.2(L) <0.2(L) 0.3  Alkaline Phos 38 - 126 U/L 84 68 73  AST 15 - 41 U/L _2 ALT 0 - 44 U/L 46(H) 26 20     CBC    Component Value Date/Time   WBC 7.2 08/21/2019 1118   RBC 3.08 (L) 08/21/2019 1118   HGB 8.6 (L) 08/21/2019 1118   HGB 11.1 (L) 06/04/2019 1218   HGB 10.4 (L) 10/04/2016 1154   HCT 26.1 (L) 08/21/2019 1118   HCT 31.3 (L) 10/04/2016 1154   PLT 192 08/21/2019 1118   PLT 243 06/04/2019 1218   PLT 320 10/04/2016 1154   MCV 84.7 08/21/2019 1118   MCV 95.7 10/04/2016 1154   MCH 27.9 08/21/2019 1118   MCHC 33.0 08/21/2019 1118   RDW 16.5 (H) 08/21/2019 1118   RDW 16.3 (H) 10/04/2016 1154   LYMPHSABS 2.4 08/21/2019 1118   LYMPHSABS 1.7 10/04/2016 1154   MONOABS 0.8 08/21/2019 1118   MONOABS 0.5 10/04/2016 1154   EOSABS 0.1 08/21/2019 1118   EOSABS 0.3 10/04/2016 1154   BASOSABS 0.0 08/21/2019 1118   BASOSABS 0.0 10/04/2016 1154    STUDIES: No  results found.  RESEARCH: Referred to PREVENT study, but was pregnant at the time; referred to [facility] 11202, but the biopsied lymph node was benign; referred to weight loss study but declined; referred to [facility] 12/09/2000, but the timing of radiation and 8 was greater than 60 days past the date of diagnosis; referred to health disparity study, but declined; referred to [id number] adjuvant therapy study, but the patient received Xeloda with radiation and therefore was ineligible   ASSESSMENT: 43 y.o. [place] woman status post right breast upper-outer quadrant biopsy 01/28/2016 for a clinical T2 N0 invasive ductal carcinoma, grade 3, triple negative, with an MIB-1 of 70%.  (a) suspicious right axillary lymph node biopsied 01/28/2016 was benign  (1) neoadjuvant chemotherapy: doxorubicin and cyclophosphamide in dose dense fashion 4 started 04/14/16, completed 05/26/2016, followed by paclitaxel and carboplatin weekly 12, Started 06/09/2016  (a) taxol discontinued after 7 doses because of neuropathy, last dose 07/21/2016  (2) genetics testing October 20, 2016 through the 32-gene Comprehensive Cancer Panel offered by [facility] Junius Roads, MD) (with MSH2 Exons 1-7 Inversion Analysis) found no deleterious mutations or VUSS  In APC, ATM, AXIN2, BARD1, BMPR1A, BRCA1, BRCA2, BRIP1, CDH1, CDK4,  CDKN2A, CHEK2, EPCAM, FANCC, MLH1, MSH2, MSH6, MUTYH, NBN, PALB2, PMS2, POLD1, POLE, PTEN, RAD51C, RAD51D, SCG5/GREM1, SMAD4, STK11, TP53, VHL, and XRCC2.    (3) right mastectomy and sentinel lymph node sampling 09/06/2016 showed a residual ypT1c ypN0, invasive ductal carcinoma, grade 3, with negative margins. Repeat prognostic panel again triple negative   (4) adjuvant radiation with capecitabine/Xeloda sensitization 11/15/16 - 01/12/17 Site/dose:   Right Chest Wall and axilla (4 field) treated to 45 Gy in 25 fractions, and then Boosted an additional 14.4 Gy in 8 fractions.  (5) tobacco abuse  disorder: The patient quit smoking 04/04/2018  METASTATIC DISEASE:  July 2020 (6) nonspecific changes noted on chest CT 06/11/2019 were clarified by PET scan 06/19/2019 showing hypermetabolic disease in the right anterior chest wall, right internal mammary nodes, right and left axillary nodes, but no metastatic disease in the neck, lungs, abdomen or pelvis.  Bone marrow uptake suggests bony metastatic disease.  (a) CARIS requested obtained from 06/28/2019 sample confirmed a triple negativity, the tumor also was negative for the androgen receptor, was MSI stable and mismatch repair status proficient, with a low mutational burden.  BRCA 1 and 2 were negative and PD-L1 was negative.  PI K3 showed a variant of uncertain significance.  However the tumor was genomic LOH high  (b) CA-27-29 is informative: was 118.3 on 06/04/2019  (7) zoledronate started 07/17/2019  (8) carboplatin/ gemcitabine days 1 and 8 Q21 day cycle started 07/10/2019   PLAN: Rebecca [name] is doing moderately well today.  I placed refills on her Tramadol and Lorazepam today.  I reviewed PMP aware.  I reviewed the goal of pain management which is to control her pain, improve quality of life, and limit side effects, such as increased sleepiness and constipation.  She understands this and is using it appropriately.  She and I reviewed that if she gets to the point that she needs Lorazepam daily for anxiety, then she should change to a different medication for anxiety such as effexor or lexapro.  She understands this.   Rebecca [name] and I reviewed her labs today.  She will proceed with chemotherapy.    Rebecca [name] and I talked about her next scans.  I ordered restaging PET scan after this cycle due to the supraclavicular swelling on the left side and her known left axillary involvement.  She understands that once authorized she will receive a call from scheduling to get this set up.  Rebecca [name] will return in 1 week for labs, and day 8 of treatment, and in 3  weeks for her next cycle.  She was recommended to continue with the appropriate pandemic precautions. She knows to call for any questions that may arise between now and her next appointment.  We are happy to see her sooner if needed.  A total of (30) minutes of face-to-face time was spent with this patient with greater than 50% of that time in counseling and care-coordination.    Wilber Bihari, NP  08/21/19 11:34 AM Medical Oncology and Hematology [facility] [address] [place], [place] [address] Tel. [phone]    Fax. [phone]

## 2019-08-21 NOTE — Patient Instructions (Signed)
[facility] Discharge Instructions for Patients Receiving Chemotherapy  Today you received the following chemotherapy agents :  Gemcitabine, Carboplatin.  To help prevent nausea and vomiting after your treatment, we encourage you to take your nausea medication as prescribed.   If you develop nausea and vomiting that is not controlled by your nausea medication, call the clinic.   BELOW ARE SYMPTOMS THAT SHOULD BE REPORTED IMMEDIATELY:  *FEVER GREATER THAN 100.5 F  *CHILLS WITH OR WITHOUT FEVER  NAUSEA AND VOMITING THAT IS NOT CONTROLLED WITH YOUR NAUSEA MEDICATION  *UNUSUAL SHORTNESS OF BREATH  *UNUSUAL BRUISING OR BLEEDING  TENDERNESS IN MOUTH AND THROAT WITH OR WITHOUT PRESENCE OF ULCERS  *URINARY PROBLEMS  *BOWEL PROBLEMS  UNUSUAL RASH Items with * indicate a potential emergency and should be followed up as soon as possible.  Feel free to call the clinic should you have any questions or concerns. The clinic phone number is [phone].  Please show the CHEMO ALERT CARD at check-in to the Emergency Department and triage nurse.   

## 2019-08-22 LAB — CANCER ANTIGEN 27.29: CA 27.29: 107.5 U/mL — ABNORMAL HIGH (ref 0.0–38.6)

## 2019-08-28 ENCOUNTER — Other Ambulatory Visit: Payer: Self-pay

## 2019-08-28 ENCOUNTER — Inpatient Hospital Stay: Payer: Medicaid Other

## 2019-08-28 VITALS — BP 121/82 | HR 91 | Temp 97.8°F | Resp 20

## 2019-08-28 DIAGNOSIS — C50411 Malignant neoplasm of upper-outer quadrant of right female breast: Secondary | ICD-10-CM

## 2019-08-28 DIAGNOSIS — C50919 Malignant neoplasm of unspecified site of unspecified female breast: Secondary | ICD-10-CM

## 2019-08-28 DIAGNOSIS — Z171 Estrogen receptor negative status [ER-]: Secondary | ICD-10-CM

## 2019-08-28 DIAGNOSIS — C7951 Secondary malignant neoplasm of bone: Secondary | ICD-10-CM

## 2019-08-28 DIAGNOSIS — Z5111 Encounter for antineoplastic chemotherapy: Secondary | ICD-10-CM | POA: Diagnosis not present

## 2019-08-28 LAB — COMPREHENSIVE METABOLIC PANEL
ALT: 29 U/L (ref 0–44)
AST: 22 U/L (ref 15–41)
Albumin: 4.1 g/dL (ref 3.5–5.0)
Alkaline Phosphatase: 68 U/L (ref 38–126)
Anion gap: 11 (ref 5–15)
BUN: 7 mg/dL (ref 6–20)
CO2: 29 mmol/L (ref 22–32)
Calcium: 9.7 mg/dL (ref 8.9–10.3)
Chloride: 103 mmol/L (ref 98–111)
Creatinine, Ser: 0.83 mg/dL (ref 0.44–1.00)
GFR calc Af Amer: >60 mL/min (ref >60)
GFR calc non Af Amer: >60 mL/min (ref >60)
Glucose, Bld: 139 mg/dL — ABNORMAL HIGH (ref 70–99)
Potassium: 3.4 mmol/L — ABNORMAL LOW (ref 3.5–5.1)
Sodium: 143 mmol/L (ref 135–145)
Total Bilirubin: <0.2 mg/dL — ABNORMAL LOW (ref 0.3–1.2)
Total Protein: 7.4 g/dL (ref 6.5–8.1)

## 2019-08-28 LAB — CBC WITH DIFFERENTIAL/PLATELET
Abs Immature Granulocytes: 0.1 10*3/uL — ABNORMAL HIGH (ref 0.00–0.07)
Basophils Absolute: 0 10*3/uL (ref 0.0–0.1)
Basophils Relative: 1 %
Eosinophils Absolute: 0 10*3/uL (ref 0.0–0.5)
Eosinophils Relative: 1 %
HCT: 25 % — ABNORMAL LOW (ref 36.0–46.0)
Hemoglobin: 8.5 g/dL — ABNORMAL LOW (ref 12.0–15.0)
Immature Granulocytes: 2 %
Lymphocytes Relative: 39 %
Lymphs Abs: 2.3 10*3/uL (ref 0.7–4.0)
MCH: 28.5 pg (ref 26.0–34.0)
MCHC: 34 g/dL (ref 30.0–36.0)
MCV: 83.9 fL (ref 80.0–100.0)
Monocytes Absolute: 0.5 10*3/uL (ref 0.1–1.0)
Monocytes Relative: 8 %
Neutro Abs: 2.9 10*3/uL (ref 1.7–7.7)
Neutrophils Relative %: 49 %
Platelets: 251 10*3/uL (ref 150–400)
RBC: 2.98 MIL/uL — ABNORMAL LOW (ref 3.87–5.11)
RDW: 16.8 % — ABNORMAL HIGH (ref 11.5–15.5)
WBC: 5.8 10*3/uL (ref 4.0–10.5)
nRBC: 0.5 % — ABNORMAL HIGH (ref 0.0–0.2)

## 2019-08-28 MED ORDER — SODIUM CHLORIDE 0.9 % IV SOLN
2000.0000 mg | Freq: Once | INTRAVENOUS | Status: AC
  Administered 2019-08-28: 15:00:00 2000 mg via INTRAVENOUS
  Filled 2019-08-28: qty 52.6

## 2019-08-28 MED ORDER — DEXAMETHASONE SODIUM PHOSPHATE 10 MG/ML IJ SOLN
INTRAMUSCULAR | Status: AC
  Filled 2019-08-28: qty 1

## 2019-08-28 MED ORDER — SODIUM CHLORIDE 0.9% FLUSH
10.0000 mL | INTRAVENOUS | Status: DC | PRN
  Administered 2019-08-28: 16:00:00 10 mL
  Filled 2019-08-28: qty 10

## 2019-08-28 MED ORDER — SODIUM CHLORIDE 0.9 % IV SOLN
300.0000 mg | Freq: Once | INTRAVENOUS | Status: AC
  Administered 2019-08-28: 300 mg via INTRAVENOUS
  Filled 2019-08-28: qty 30

## 2019-08-28 MED ORDER — SODIUM CHLORIDE 0.9 % IV SOLN
Freq: Once | INTRAVENOUS | Status: AC
  Administered 2019-08-28: 14:00:00 via INTRAVENOUS
  Filled 2019-08-28: qty 250

## 2019-08-28 MED ORDER — PALONOSETRON HCL INJECTION 0.25 MG/5ML
0.2500 mg | Freq: Once | INTRAVENOUS | Status: AC
  Administered 2019-08-28: 14:00:00 0.25 mg via INTRAVENOUS

## 2019-08-28 MED ORDER — PALONOSETRON HCL INJECTION 0.25 MG/5ML
INTRAVENOUS | Status: AC
  Filled 2019-08-28: qty 5

## 2019-08-28 MED ORDER — DEXAMETHASONE SODIUM PHOSPHATE 10 MG/ML IJ SOLN
10.0000 mg | Freq: Once | INTRAMUSCULAR | Status: AC
  Administered 2019-08-28: 14:00:00 10 mg via INTRAVENOUS

## 2019-08-28 MED ORDER — SODIUM CHLORIDE 0.9% FLUSH
10.0000 mL | INTRAVENOUS | Status: DC | PRN
  Administered 2019-08-28: 10 mL
  Filled 2019-08-28: qty 10

## 2019-08-28 MED ORDER — HEPARIN SOD (PORK) LOCK FLUSH 100 UNIT/ML IV SOLN
500.0000 [IU] | Freq: Once | INTRAVENOUS | Status: AC | PRN
  Administered 2019-08-28: 16:00:00 500 [IU]
  Filled 2019-08-28: qty 5

## 2019-08-28 NOTE — Patient Instructions (Signed)

## 2019-08-28 NOTE — Patient Instructions (Signed)
[facility] Discharge Instructions for Patients Receiving Chemotherapy  Today you received the following chemotherapy agents Gemzar and Carboplatin   To help prevent nausea and vomiting after your treatment, we encourage you to take your nausea medication as directed.    If you develop nausea and vomiting that is not controlled by your nausea medication, call the clinic.   BELOW ARE SYMPTOMS THAT SHOULD BE REPORTED IMMEDIATELY:  *FEVER GREATER THAN 100.5 F  *CHILLS WITH OR WITHOUT FEVER  NAUSEA AND VOMITING THAT IS NOT CONTROLLED WITH YOUR NAUSEA MEDICATION  *UNUSUAL SHORTNESS OF BREATH  *UNUSUAL BRUISING OR BLEEDING  TENDERNESS IN MOUTH AND THROAT WITH OR WITHOUT PRESENCE OF ULCERS  *URINARY PROBLEMS  *BOWEL PROBLEMS  UNUSUAL RASH Items with * indicate a potential emergency and should be followed up as soon as possible.  Feel free to call the clinic should you have any questions or concerns. The clinic phone number is [phone].  Please show the CHEMO ALERT CARD at check-in to the Emergency Department and triage nurse.   

## 2019-08-29 ENCOUNTER — Encounter (HOSPITAL_COMMUNITY): Payer: Medicaid Other

## 2019-09-02 ENCOUNTER — Telehealth: Payer: Self-pay | Admitting: Adult Health

## 2019-09-02 ENCOUNTER — Telehealth: Payer: Self-pay

## 2019-09-02 DIAGNOSIS — C50919 Malignant neoplasm of unspecified site of unspecified female breast: Secondary | ICD-10-CM

## 2019-09-02 NOTE — Telephone Encounter (Signed)
Spoke with Dr. Jake Seats regarding PET scan that was ordered for restaging for [facility]. I explained to her that initially her CT chest was inconclusive for recurrence and we had to do PET scan in order to evaluate for FDG activity to verify recurrence and guide biopsy.  Per Dr. Jake Seats, insurance will not pay for restaging PET scan unless CT chest/abdomen/pelvis is inconclusive.  We will proceed with CT scans of the above.  Authorization: [id number].  This is good for 30 calendar days.

## 2019-09-02 NOTE — Telephone Encounter (Signed)
Spoke with patient and explained about her insurance not covering restaging PET.  Insurance will cover PET scan if CT of chest/abdomen/pelvis is inconclusive.  Explained to patient that scheduling will reach out to her to schedule CT's and restaging PET.  Patient voiced understanding of above and had no questions at this time.  Scheduling notified of changes and was asked to reach out to patient to schedule above.  Authorization number was not needed at this time.

## 2019-09-02 NOTE — Telephone Encounter (Signed)
-----   Message from Gardenia Phlegm, NP sent at 09/02/2019 11:11 AM EDT ----- Spoke with Dr. Jake Seats regarding PET scan that was ordered for restaging for [facility]. I explained to her that initially her CT chest was inconclusive for recurrence and we had to do PET scan in order to evaluate for FDG activity to verify recurrence and guide biopsy.  Per Dr. Jake Seats, insurance will not pay for restaging PET scan unless CT chest/abdomen/pelvis is inconclusive.  We will proceed with CT scans of the above.  Authorization: [id number].  This is good for 30 calendar days.    Please call and cancel PET scan and get them to call patient to get the restaging scheduled.  The authorization is listed above.  Please call patient and let her know that insurance wants [place] to do CT scans first.    Thanks, Mendel Ryder

## 2019-09-03 ENCOUNTER — Telehealth: Payer: Self-pay | Admitting: *Deleted

## 2019-09-03 NOTE — Telephone Encounter (Signed)
This RN spoke with pt per her call stating concern due to " surgical site now draining and has an odor ".  She states site has a dime size opening which has been present for over 2 weeks. Draining and odor just started this week.  She is not running any fevers at this time.  This RN discussed how to do a wet to dry dressing for this evening after cleaning gently with soap and water.  Appointment made at 1130 per noted cancellation of prior patient scheduled at this time.

## 2019-09-03 NOTE — Progress Notes (Signed)
[facility]  [phone] [phone]   ID: Rebecca [name] DOB: 01/19/1976  MR#: [id number]  CSN#:[id number]  Patient Care Team: Hollis, Lachina M, FNP as PCP - General (Family Medicine) Newman, David, MD as Consulting Physician (General Surgery) Magrinat, Gustav C, MD as Consulting Physician (Oncology) Kinard, James, MD as Consulting Physician (Radiation Oncology) ,  Cornetto, NP as Nurse Practitioner (Hematology and Oncology) Patel, Donika K, DO as Consulting Physician (Neurology) OTHER MD:  CHIEF COMPLAINT: triple negative stage IV breast cancer  CURRENT TREATMENT: carboplatin/ gemcitabine; zoledronate   INTERVAL HISTORY: Rebecca [name] returns today for follow-up and treatment of her recurrent triple negative breast cancer. She was last seen here on 07/17/2019.   She continues on carboplatin and gemcitabine.  She has completed three cycles of this therapy.  She receives treatment on days 1 and 8 of each 21-day cycle.  She is scheduled for restaging tomorrow.    She also continues on zolendronate.  Her most recent dose was 07/17/2019 and she receives this every 12 weeks.  REVIEW OF SYSTEMS: Rebecca [name] is here today for urgent evaluation.  She notes that her mastectomy site from 2017 has reopened and is draining a foul odor.  She isn't having pain.  She says this started about 1 week ago and she wonders if it is infection.  She has been applying neosporin and a maxi pad to it.  She denies any fever or chills.  She notes some mild tenderness to palpation around the opening of her site.    Rebecca [name] feels well otherwise.  She has no cough, shortness of breath, chest pain, or palpitations.  She is without nausea, vomiting, bowel/bladder changes.  A detailed ROS was otherwise non contributory.    BREAST CANCER HISTORY: From the original intake note:  Rebecca [name] herself noted a change in her right breast sometime around September or October 2016. She did not bring it  to intermediate medical attention, but on 01/13/2016 she established herself in Dr. Hollis' service and she was set up for bilateral diagnostic mammography with tomosynthesis and [facility]. The breast density was category B. In the upper outer quadrant of the right breast there was a spiculated mass measuring 2.8 cm. On physical exam this was palpable. Targeted ultrasonography confirmed an irregular hypoechoic mass in the right breast 11:30 o'clock position measuring 2.6 cm maximally. Ultrasound of the right axilla showed a morphologically abnormal lymph node.  In the left breast there were some tubular densities behind the areola which by ultrasonography showed benign ductal ectasia.  On 01/28/2016 Rebecca [name] underwent biopsy of the right breast mass and abnormal right axillary lymph node. The pathology from this procedure (S [id number]) showed the lymph node to be benign. In the breast however there was an invasive ductal carcinoma, grade 3, which was estrogen and progesterone receptor negative. The proliferation marker was 70%. HER-2 was not amplified with a signals ratio of 1.32. The number per cell was 2.05.  The patient's subsequent history is as detailed below   PAST MEDICAL HISTORY: Past Medical History:  Diagnosis Date  . Anxiety   . Breast cancer (HCC)   . Depression   . GERD (gastroesophageal reflux disease)   . History of radiation therapy 11/15/16-01/12/17   right chest wall and axilla treated to 45 Gy in 25 fractions, boosted and additional 14 Gy in 8 fractions  . Hypertension    diet controlled  . Obesity (BMI 35.0-39.9 without comorbidity)   . Pneumonia      as a child  . Seasonal allergies   . Sickle cell trait (HCC)   . Termination of pregnancy (fetus) 04/02/16    PAST SURGICAL HISTORY: Past Surgical History:  Procedure Laterality Date  . CESAREAN SECTION     2004 and 2007  . MASTECTOMY W/ SENTINEL NODE BIOPSY Right 09/06/2016    Procedure: RIGHT BREAST MASTECTOMY WITH RIGHT AXILLARY SENTINEL LYMPH NODE BIOPSY;  Surgeon: [name] Newman, MD;  Location: [facility];  Service: General;  Laterality: Right;  . PORT-A-CATH REMOVAL Left 09/06/2016   Procedure: REMOVAL PORT-A-CATH;  Surgeon: [name] Newman, MD;  Location: [facility];  Service: General;  Laterality: Left;  . PORTACATH PLACEMENT    . PORTACATH PLACEMENT N/A 07/09/2019   Procedure: INSERTION PORT-A-CATH WITH ULTRASOUND;  Surgeon: Newman, David, MD;  Location: [facility];  Service: General;  Laterality: N/A;    FAMILY HISTORY Family History  Problem Relation Age of Onset  . Hypertension Mother   . Cancer Mother        dx "intestinal cancer" in her 20s; +surgery  . Other Mother        hysterectomy at young age for unspecified cause  . Heart Problems Mother   . Breast cancer Cousin        maternal 1st cousin dx female breast cancer at 45-46y  . Cancer Father   . Hypertension Father   . Heart Problems Maternal Aunt   . Diabetes Maternal Aunt   . Breast cancer Maternal Uncle        dx 64-65  . Heart Problems Maternal Uncle   . Breast cancer Maternal Grandmother 50  . Throat cancer Maternal Grandfather        d. 50s; smoker  . Sickle cell anemia Paternal Aunt   . Congestive Heart Failure Maternal Aunt   . Multiple sclerosis Cousin   . Cancer Other        maternal great uncle (MGM's brother); cancer removed from his side  . Heart attack Paternal Aunt        d. early 50s  The patient has very little information about her father. Her mother is currently 63 years old. She had a history of cervical cancer at age 28. The patient had 2 brothers, no sisters. The maternal grandfather had throat cancer. A maternal uncle was diagnosed with breast cancer as well as prostate cancer at the age of 65. 2 maternal cousins, one of the mail, had breast cancer as well.   GYNECOLOGIC HISTORY:  No LMP recorded. Menarche age 7, first live birth age 15. The  patient is GX P4. She was still having regular periods at the time of diagnosis. She took oral contraceptives in the 1990s with no side effects.--.  Stopped with chemotherapy and have not resumed as of May 2019   SOCIAL HISTORY:  (Updated 10/2018) She works as a personal care CNA. The patient's significant other [name] works at [employer].  At home with the patient are her 3 children [name], Shakya Huntley and [name]. There are age 16, 15, and 12 as of November 2019.  2 of them are disabled or have significant health problems, one with sickle cell disease, the other with autism and developmental delay.  The patient's son Mark King, currently 26 years old, lives in [place].    ADVANCED DIRECTIVES: Not in place   HEALTH MAINTENANCE: Social History   Socioeconomic History  . Marital status: Single    Spouse name:   Not on file  . Number of children: 4  . Years of education: Not on file  . Highest education level: Not on file  Occupational History  . Occupation: Private Care Attendant    Comment: First choice [employer]  . Financial resource strain: Not on file  . Food insecurity    Worry: Never true    Inability: Never true  . Transportation needs    Medical: No    Non-medical: No  Tobacco Use  . Smoking status: Former Smoker    Packs/day: 1.00    Years: 20.00    Pack years: 20.00    Types: Cigarettes    Quit date: 04/01/2018    Years since quitting: 1.4  . Smokeless tobacco: Never Used  . Tobacco comment: Patient has quit smoking x 1 year now  Substance and Sexual Activity  . Alcohol use: Yes    Comment: occ  . Drug use: No  . Sexual activity: Not on file  Lifestyle  . Physical activity    Days per week: 7 days    Minutes per session: 60 min  . Stress: Not at all  Relationships  . Social Herbalist on phone: Not on file    Gets together: Not on file    Attends religious service: Not on file    Active  member of club or organization: Not on file    Attends meetings of clubs or organizations: Not on file    Relationship status: Not on file  Other Topics Concern  . Not on file  Social History Narrative  . Not on file     Colonoscopy:  PAP:  Bone density:  Lipid panel:  No Known Allergies  Current Outpatient Medications on File Prior to Visit  Medication Sig Dispense Refill  . calcium carbonate (TUMS - DOSED IN MG ELEMENTAL CALCIUM) 500 MG chewable tablet Chew 1 tablet by mouth daily as needed for indigestion or heartburn.    . dexamethasone (DECADRON) 4 MG tablet Take 2 tablets (8 mg total) by mouth daily. Start the day after chemotherapy for 2 days. Take with food. 30 tablet 1  . lidocaine-prilocaine (EMLA) cream Apply to affected area once 30 g 3  . LORazepam (ATIVAN) 0.5 MG tablet Take 1 tablet (0.5 mg total) by mouth daily as needed for anxiety. 30 tablet 0  . omeprazole (PRILOSEC) 20 MG capsule Take 1 capsule (20 mg total) by mouth daily. 30 capsule 1  . prochlorperazine (COMPAZINE) 10 MG tablet Take 1 tablet (10 mg total) by mouth every 6 (six) hours as needed (Nausea or vomiting). 30 tablet 1  . traMADol (ULTRAM) 50 MG tablet Take 1 tablet (50 mg total) by mouth every 6 (six) hours as needed. 30 tablet 0   No current facility-administered medications on file prior to visit.     OBJECTIVE:   Vitals:   09/04/19 1154  BP: 119/71  Pulse: (!) 104  Resp: 18  Temp: 98 F (36.7 [name])  SpO2: 100%   Wt Readings from Last 3 Encounters:  09/04/19 205 lb 8 oz (93.2 kg)  08/21/19 208 lb 11.2 oz (94.7 kg)  07/31/19 208 lb 4.8 oz (94.5 kg)   Body mass index is 38.2 kg/[name].    ECOG FS:1 - Symptomatic but completely ambulatory GENERAL: Patient is a well appearing female in no acute distress HEENT:  Sclerae anicteric.  Oropharynx clear and moist. No ulcerations or evidence of oropharyngeal candidiasis. Neck is supple.  NODES:  No cervical, supraclavicular, or axillary  lymphadenopathy palpated. BREAST EXAM: see picture below LUNGS:  Clear to auscultation bilaterally.  No wheezes or rhonchi. HEART:  Regular rate and rhythm. No murmur appreciated. ABDOMEN:  Soft, nontender.  Positive, normoactive bowel sounds. No organomegaly palpated. MSK:  No focal spinal tenderness to palpation. Full range of motion bilaterally in the upper extremities. EXTREMITIES:  No peripheral edema.   SKIN:  Clear with no obvious rashes or skin changes. No nail dyscrasia. NEURO:  Nonfocal. Well oriented.  Appropriate affect.   Right chest wall on 09/04/2019    LAB RESULTS: CMP Latest Ref Rng & Units 08/28/2019 08/21/2019 08/07/2019  Glucose 70 - 99 mg/dL 139(H) 91 104(H)  BUN 6 - 20 mg/dL 7 6 7  Creatinine 0.44 - 1.00 mg/dL 0.83 0.77 0.73  Sodium 135 - 145 mmol/L 143 142 143  Potassium 3.5 - 5.1 mmol/L 3.4(L) 3.7 3.8  Chloride 98 - 111 mmol/L 103 107 108  CO2 22 - 32 mmol/L 29 28 26  Calcium 8.9 - 10.3 mg/dL 9.7 8.5(L) 8.9  Total Protein 6.5 - 8.1 g/dL 7.4 7.3 7.3  Total Bilirubin 0.3 - 1.2 mg/dL <0.2(L) <0.2(L) <0.2(L)  Alkaline Phos 38 - 126 U/L 68 65 84  AST 15 - 41 U/L 22 22 29  ALT 0 - 44 U/L 29 23 46(H)     CBC    Component Value Date/Time   WBC 5.8 08/28/2019 1300   RBC 2.98 (L) 08/28/2019 1300   HGB 8.5 (L) 08/28/2019 1300   HGB 11.1 (L) 06/04/2019 1218   HGB 10.4 (L) 10/04/2016 1154   HCT 25.0 (L) 08/28/2019 1300   HCT 31.3 (L) 10/04/2016 1154   PLT 251 08/28/2019 1300   PLT 243 06/04/2019 1218   PLT 320 10/04/2016 1154   MCV 83.9 08/28/2019 1300   MCV 95.7 10/04/2016 1154   MCH 28.5 08/28/2019 1300   MCHC 34.0 08/28/2019 1300   RDW 16.8 (H) 08/28/2019 1300   RDW 16.3 (H) 10/04/2016 1154   LYMPHSABS 2.3 08/28/2019 1300   LYMPHSABS 1.7 10/04/2016 1154   MONOABS 0.5 08/28/2019 1300   MONOABS 0.5 10/04/2016 1154   EOSABS 0.0 08/28/2019 1300   EOSABS 0.3 10/04/2016 1154   BASOSABS 0.0 08/28/2019 1300   BASOSABS 0.0 10/04/2016 1154    STUDIES: No  results found.  RESEARCH: Referred to PREVENT study, but was pregnant at the time; referred to [facility] 11202, but the biopsied lymph node was benign; referred to weight loss study but declined; referred to [facility] 12/09/2000, but the timing of radiation and 8 was greater than 60 days past the date of diagnosis; referred to health disparity study, but declined; referred to [id number] adjuvant therapy study, but the patient received Xeloda with radiation and therefore was ineligible   ASSESSMENT: 43 y.o. [place] woman status post right breast upper-outer quadrant biopsy 01/28/2016 for a clinical T2 N0 invasive ductal carcinoma, grade 3, triple negative, with an MIB-1 of 70%.  (a) suspicious right axillary lymph node biopsied 01/28/2016 was benign  (1) neoadjuvant chemotherapy: doxorubicin and cyclophosphamide in dose dense fashion 4 started 04/14/16, completed 05/26/2016, followed by paclitaxel and carboplatin weekly 12, Started 06/09/2016  (a) taxol discontinued after 7 doses because of neuropathy, last dose 07/21/2016  (2) genetics testing October 20, 2016 through the 32-gene Comprehensive Cancer Panel offered by [facility] ([place], [place]) (with MSH2 Exons 1-7 Inversion Analysis) found no deleterious mutations or VUSS  In APC, ATM, AXIN2, BARD1, BMPR1A,   BRCA1, BRCA2, BRIP1, CDH1, CDK4, CDKN2A, CHEK2, EPCAM, FANCC, MLH1, MSH2, MSH6, MUTYH, NBN, PALB2, PMS2, POLD1, POLE, PTEN, RAD51C, RAD51D, SCG5/GREM1, SMAD4, STK11, TP53, VHL, and XRCC2.    (3) right mastectomy and sentinel lymph node sampling 09/06/2016 showed a residual ypT1c ypN0, invasive ductal carcinoma, grade 3, with negative margins. Repeat prognostic panel again triple negative   (4) adjuvant radiation with capecitabine/Xeloda sensitization 11/15/16 - 01/12/17 Site/dose:   Right Chest Wall and axilla (4 field) treated to 45 Gy in 25 fractions, and then Boosted an additional 14.4 Gy in 8 fractions.  (5) tobacco abuse  disorder: The patient quit smoking 04/04/2018  METASTATIC DISEASE:  July 2020 (6) nonspecific changes noted on chest CT 06/11/2019 were clarified by PET scan 06/19/2019 showing hypermetabolic disease in the right anterior chest wall, right internal mammary nodes, right and left axillary nodes, but no metastatic disease in the neck, lungs, abdomen or pelvis.  Bone marrow uptake suggests bony metastatic disease.  (a) CARIS requested obtained from 06/28/2019 sample confirmed a triple negativity, the tumor also was negative for the androgen receptor, was MSI stable and mismatch repair status proficient, with a low mutational burden.  BRCA 1 and 2 were negative and PD-L1 was negative.  PI K3 showed a variant of uncertain significance.  However the tumor was genomic LOH high  (b) CA-27-29 is informative: was 118.3 on 06/04/2019  (7) zoledronate started 07/17/2019  (8) carboplatin/ gemcitabine days 1 and 8 Q21 day cycle started 07/10/2019   PLAN: Charlane is here for urgent evaluation of a new wound on her chest wall.  I am concerned that this is progression and that the tumor is eroding through the skin.  I have reviewed this with Dr. Magrinat, who agrees with proceeding with her restaging, pulling up her caris testing, and consideration of changing therapy once we have all the information.  My nurse changed her dressing today and I prescribed Metronidazole for her to apply over the wound to help with the odor.  She does not want to see surgery at this time.  She is due to see us again on 10/7.  She was recommended to continue with the appropriate pandemic precautions. She knows to call for any questions that may arise between now and her next appointment.  We are happy to see her sooner if needed.   A total of (20) minutes of face-to-face time was spent with this patient with greater than 50% of that time in counseling and care-coordination.     , NP  09/04/19 6:46 PM Medical Oncology and  Hematology [facility] [address] [place], [place] [address] Tel. [phone]    Fax. [phone]   

## 2019-09-04 ENCOUNTER — Inpatient Hospital Stay (HOSPITAL_BASED_OUTPATIENT_CLINIC_OR_DEPARTMENT_OTHER): Payer: Medicaid Other | Admitting: Adult Health

## 2019-09-04 ENCOUNTER — Encounter: Payer: Self-pay | Admitting: Adult Health

## 2019-09-04 ENCOUNTER — Other Ambulatory Visit: Payer: Self-pay | Admitting: Adult Health

## 2019-09-04 ENCOUNTER — Other Ambulatory Visit: Payer: Self-pay

## 2019-09-04 VITALS — BP 119/71 | HR 104 | Temp 98.0°F | Resp 18 | Ht 61.5 in | Wt 205.5 lb

## 2019-09-04 DIAGNOSIS — C50411 Malignant neoplasm of upper-outer quadrant of right female breast: Secondary | ICD-10-CM

## 2019-09-04 DIAGNOSIS — Z171 Estrogen receptor negative status [ER-]: Secondary | ICD-10-CM

## 2019-09-04 DIAGNOSIS — Z5111 Encounter for antineoplastic chemotherapy: Secondary | ICD-10-CM | POA: Diagnosis not present

## 2019-09-04 MED ORDER — METRONIDAZOLE POWD: 1.0000 "application " | Freq: Every day | 0 refills | Status: DC

## 2019-09-04 NOTE — Progress Notes (Signed)
Right chest wall [id number]

## 2019-09-05 ENCOUNTER — Ambulatory Visit (HOSPITAL_COMMUNITY)
Admission: RE | Admit: 2019-09-05 | Discharge: 2019-09-05 | Disposition: A | Discharge status: Home / Self Care | Payer: Medicaid Other | Source: Ambulatory Visit | Attending: Adult Health | Admitting: Adult Health

## 2019-09-05 ENCOUNTER — Ambulatory Visit (HOSPITAL_COMMUNITY): Payer: Medicaid Other

## 2019-09-05 ENCOUNTER — Other Ambulatory Visit: Payer: Self-pay | Admitting: *Deleted

## 2019-09-05 DIAGNOSIS — K760 Fatty (change of) liver, not elsewhere classified: Secondary | ICD-10-CM | POA: Diagnosis not present

## 2019-09-05 DIAGNOSIS — C50919 Malignant neoplasm of unspecified site of unspecified female breast: Secondary | ICD-10-CM

## 2019-09-05 DIAGNOSIS — R590 Localized enlarged lymph nodes: Secondary | ICD-10-CM | POA: Diagnosis not present

## 2019-09-05 MED ORDER — SODIUM CHLORIDE (PF) 0.9 % IJ SOLN
INTRAMUSCULAR | Status: AC
  Filled 2019-09-05: qty 50

## 2019-09-05 MED ORDER — METRONIDAZOLE 500 MG PO TABS: 500.0000 mg | ORAL_TABLET | Freq: Two times a day (BID) | ORAL | 1 refills | Status: DC

## 2019-09-05 MED ORDER — IOHEXOL 300 MG/ML  SOLN
100.0000 mL | Freq: Once | INTRAMUSCULAR | Status: AC | PRN
  Administered 2019-09-05: 100 mL via INTRAVENOUS

## 2019-09-05 NOTE — Progress Notes (Signed)
Pt pharmacy does not carry Metronidazole powder.  Per Dr. [name], pt to crush one tablet of flagyl and apply the power from the crushed tablet to the affect area twice a day with dressing changes. Prescription sent to preferred pharmacy. RN spoke with pt and explained instructions.  Pt verbalized understanding.

## 2019-09-11 ENCOUNTER — Encounter: Payer: Self-pay | Admitting: General Practice

## 2019-09-11 ENCOUNTER — Inpatient Hospital Stay: Payer: Medicaid Other

## 2019-09-11 ENCOUNTER — Encounter: Payer: Self-pay | Admitting: Adult Health

## 2019-09-11 ENCOUNTER — Other Ambulatory Visit: Payer: Self-pay

## 2019-09-11 ENCOUNTER — Inpatient Hospital Stay: Payer: Medicaid Other | Attending: Oncology

## 2019-09-11 ENCOUNTER — Inpatient Hospital Stay (HOSPITAL_BASED_OUTPATIENT_CLINIC_OR_DEPARTMENT_OTHER): Payer: Medicaid Other | Admitting: Adult Health

## 2019-09-11 VITALS — BP 123/78 | HR 98 | Temp 98.5°F | Resp 17 | Ht 61.5 in | Wt 210.0 lb

## 2019-09-11 DIAGNOSIS — C7951 Secondary malignant neoplasm of bone: Secondary | ICD-10-CM

## 2019-09-11 DIAGNOSIS — Z171 Estrogen receptor negative status [ER-]: Secondary | ICD-10-CM | POA: Insufficient documentation

## 2019-09-11 DIAGNOSIS — D696 Thrombocytopenia, unspecified: Secondary | ICD-10-CM | POA: Diagnosis not present

## 2019-09-11 DIAGNOSIS — C50411 Malignant neoplasm of upper-outer quadrant of right female breast: Secondary | ICD-10-CM | POA: Diagnosis not present

## 2019-09-11 DIAGNOSIS — F418 Other specified anxiety disorders: Secondary | ICD-10-CM | POA: Insufficient documentation

## 2019-09-11 DIAGNOSIS — I10 Essential (primary) hypertension: Secondary | ICD-10-CM | POA: Diagnosis not present

## 2019-09-11 DIAGNOSIS — Z87891 Personal history of nicotine dependence: Secondary | ICD-10-CM | POA: Insufficient documentation

## 2019-09-11 DIAGNOSIS — Z923 Personal history of irradiation: Secondary | ICD-10-CM | POA: Diagnosis not present

## 2019-09-11 DIAGNOSIS — Z5111 Encounter for antineoplastic chemotherapy: Secondary | ICD-10-CM | POA: Insufficient documentation

## 2019-09-11 DIAGNOSIS — D649 Anemia, unspecified: Secondary | ICD-10-CM | POA: Diagnosis not present

## 2019-09-11 DIAGNOSIS — Z79899 Other long term (current) drug therapy: Secondary | ICD-10-CM | POA: Diagnosis not present

## 2019-09-11 DIAGNOSIS — K5900 Constipation, unspecified: Secondary | ICD-10-CM | POA: Diagnosis not present

## 2019-09-11 DIAGNOSIS — D573 Sickle-cell trait: Secondary | ICD-10-CM | POA: Diagnosis not present

## 2019-09-11 DIAGNOSIS — C50919 Malignant neoplasm of unspecified site of unspecified female breast: Secondary | ICD-10-CM

## 2019-09-11 DIAGNOSIS — E669 Obesity, unspecified: Secondary | ICD-10-CM | POA: Diagnosis not present

## 2019-09-11 DIAGNOSIS — K219 Gastro-esophageal reflux disease without esophagitis: Secondary | ICD-10-CM | POA: Diagnosis not present

## 2019-09-11 DIAGNOSIS — Z9011 Acquired absence of right breast and nipple: Secondary | ICD-10-CM | POA: Insufficient documentation

## 2019-09-11 LAB — CBC WITH DIFFERENTIAL/PLATELET
Abs Immature Granulocytes: 0.03 10*3/uL (ref 0.00–0.07)
Basophils Absolute: 0 10*3/uL (ref 0.0–0.1)
Basophils Relative: 0 %
Eosinophils Absolute: 0.1 10*3/uL (ref 0.0–0.5)
Eosinophils Relative: 1 %
HCT: 23.5 % — ABNORMAL LOW (ref 36.0–46.0)
Hemoglobin: 7.9 g/dL — ABNORMAL LOW (ref 12.0–15.0)
Immature Granulocytes: 0 %
Lymphocytes Relative: 29 %
Lymphs Abs: 2.2 10*3/uL (ref 0.7–4.0)
MCH: 28.7 pg (ref 26.0–34.0)
MCHC: 33.6 g/dL (ref 30.0–36.0)
MCV: 85.5 fL (ref 80.0–100.0)
Monocytes Absolute: 0.8 10*3/uL (ref 0.1–1.0)
Monocytes Relative: 11 %
Neutro Abs: 4.3 10*3/uL (ref 1.7–7.7)
Neutrophils Relative %: 59 %
Platelets: 140 10*3/uL — ABNORMAL LOW (ref 150–400)
RBC: 2.75 MIL/uL — ABNORMAL LOW (ref 3.87–5.11)
RDW: 19.4 % — ABNORMAL HIGH (ref 11.5–15.5)
WBC: 7.4 10*3/uL (ref 4.0–10.5)
nRBC: 0 % (ref 0.0–0.2)

## 2019-09-11 LAB — COMPREHENSIVE METABOLIC PANEL
ALT: 24 U/L (ref 0–44)
AST: 19 U/L (ref 15–41)
Albumin: 3.9 g/dL (ref 3.5–5.0)
Alkaline Phosphatase: 65 U/L (ref 38–126)
Anion gap: 7 (ref 5–15)
BUN: 5 mg/dL — ABNORMAL LOW (ref 6–20)
CO2: 27 mmol/L (ref 22–32)
Calcium: 8.4 mg/dL — ABNORMAL LOW (ref 8.9–10.3)
Chloride: 108 mmol/L (ref 98–111)
Creatinine, Ser: 0.78 mg/dL (ref 0.44–1.00)
GFR calc Af Amer: >60 mL/min (ref >60)
GFR calc non Af Amer: >60 mL/min (ref >60)
Glucose, Bld: 125 mg/dL — ABNORMAL HIGH (ref 70–99)
Potassium: 3.1 mmol/L — ABNORMAL LOW (ref 3.5–5.1)
Sodium: 142 mmol/L (ref 135–145)
Total Bilirubin: <0.2 mg/dL — ABNORMAL LOW (ref 0.3–1.2)
Total Protein: 7.3 g/dL (ref 6.5–8.1)

## 2019-09-11 MED ORDER — DEXAMETHASONE SODIUM PHOSPHATE 10 MG/ML IJ SOLN
10.0000 mg | Freq: Once | INTRAMUSCULAR | Status: AC
  Administered 2019-09-11: 13:00:00 10 mg via INTRAVENOUS

## 2019-09-11 MED ORDER — SODIUM CHLORIDE 0.9 % IV SOLN
Freq: Once | INTRAVENOUS | Status: AC
  Administered 2019-09-11: 13:00:00 via INTRAVENOUS
  Filled 2019-09-11: qty 250

## 2019-09-11 MED ORDER — SODIUM CHLORIDE 0.9% FLUSH
10.0000 mL | INTRAVENOUS | Status: DC | PRN
  Administered 2019-09-11: 10 mL
  Filled 2019-09-11: qty 10

## 2019-09-11 MED ORDER — SODIUM CHLORIDE 0.9 % IV SOLN
2000.0000 mg | Freq: Once | INTRAVENOUS | Status: AC
  Administered 2019-09-11: 2000 mg via INTRAVENOUS
  Filled 2019-09-11: qty 52.6

## 2019-09-11 MED ORDER — SODIUM CHLORIDE 0.9 % IV SOLN
300.0000 mg | Freq: Once | INTRAVENOUS | Status: AC
  Administered 2019-09-11: 15:00:00 300 mg via INTRAVENOUS
  Filled 2019-09-11: qty 30

## 2019-09-11 MED ORDER — HEPARIN SOD (PORK) LOCK FLUSH 100 UNIT/ML IV SOLN
500.0000 [IU] | Freq: Once | INTRAVENOUS | Status: AC | PRN
  Administered 2019-09-11: 500 [IU]
  Filled 2019-09-11: qty 5

## 2019-09-11 MED ORDER — PALONOSETRON HCL INJECTION 0.25 MG/5ML
0.2500 mg | Freq: Once | INTRAVENOUS | Status: AC
  Administered 2019-09-11: 0.25 mg via INTRAVENOUS

## 2019-09-11 MED ORDER — PALONOSETRON HCL INJECTION 0.25 MG/5ML
INTRAVENOUS | Status: AC
  Filled 2019-09-11: qty 5

## 2019-09-11 MED ORDER — DEXAMETHASONE SODIUM PHOSPHATE 10 MG/ML IJ SOLN
INTRAMUSCULAR | Status: AC
  Filled 2019-09-11: qty 1

## 2019-09-11 NOTE — Progress Notes (Signed)
Per Wilber Bihari, NP ok to treat with K 3.1 and Hgb 7.9.

## 2019-09-11 NOTE — Patient Instructions (Signed)

## 2019-09-11 NOTE — Patient Instructions (Signed)
[facility] Discharge Instructions for Patients Receiving Chemotherapy  Today you received the following chemotherapy agents: Gemzar and Carboplatin   To help prevent nausea and vomiting after your treatment, we encourage you to take your nausea medication as directed.    If you develop nausea and vomiting that is not controlled by your nausea medication, call the clinic.   BELOW ARE SYMPTOMS THAT SHOULD BE REPORTED IMMEDIATELY:  *FEVER GREATER THAN 100.5 F  *CHILLS WITH OR WITHOUT FEVER  NAUSEA AND VOMITING THAT IS NOT CONTROLLED WITH YOUR NAUSEA MEDICATION  *UNUSUAL SHORTNESS OF BREATH  *UNUSUAL BRUISING OR BLEEDING  TENDERNESS IN MOUTH AND THROAT WITH OR WITHOUT PRESENCE OF ULCERS  *URINARY PROBLEMS  *BOWEL PROBLEMS  UNUSUAL RASH Items with * indicate a potential emergency and should be followed up as soon as possible.  Feel free to call the clinic should you have any questions or concerns. The clinic phone number is [phone].  Please show the [facility] at check-in to the Emergency Department and triage nurse.   Potassium Content of Foods  Potassium is a mineral found in many foods and drinks. It affects how the heart works, and helps keep fluids and minerals balanced in the body. The amount of potassium you need each day depends on your age and any medical conditions you may have. Talk to your health care provider or dietitian about how much potassium you need. The following lists of foods provide the general serving size for foods and the approximate amount of potassium in each serving, listed in milligrams (mg). Actual values may vary depending on the product and how it is processed. High in potassium The following foods and beverages have 200 mg or more of potassium per serving:  Apricots (raw) - 2 have 200 mg of potassium.  Apricots (dry) - 5 have 200 mg of potassium.  Artichoke - 1 medium has 345 mg of potassium.  Avocado -  fruit  has 245 mg of potassium.  Banana - 1 medium fruit has 425 mg of potassium.  [place] or baked beans (canned) -  cup has 280 mg of potassium.  White beans (canned) -  cup has 595 mg potassium.  Beef roast - 3 oz has 320 mg of potassium.  Ground beef - 3 oz has 270 mg of potassium.  Beets (raw or cooked) -  cup has 260 mg of potassium.  Bran muffin - 2 oz has 300 mg of potassium.  Broccoli (cooked) -  cup has 230 mg of potassium.  Brussels sprouts -  cup has 250 mg of potassium.  Cantaloupe -  cup has 215 mg of potassium.  Cereal, 100% bran -  cup has 200-400 mg of potassium.  Cheeseburger -1 single fast food burger has 225-400 mg of potassium.  Chicken - 3 oz has 220 mg of potassium.  Clams (canned) - 3 oz has 535 mg of potassium.  Crab - 3 oz has 225 mg of potassium.  Dates - 5 have 270 mg of potassium.  Dried beans and peas -  cup has 300-475 mg of potassium.  Figs (dried) - 2 have 260 mg of potassium.  Fish (halibut, tuna, cod, snapper) - 3 oz has 480 mg of potassium.  Fish (salmon, haddock, swordfish, perch) - 3 oz has 300 mg of potassium.  Fish (tuna, canned) - 3 oz has 200 mg of potassium.  [place] fries (fast food) - 3 oz has 470 mg of potassium.  Granola with fruit  and nuts -  cup has 200 mg of potassium.  Grapefruit juice -  cup has 200 mg of potassium.  Honeydew melon -  cup has 200 mg of potassium.  Kale (raw) - 1 cup has 300 mg of potassium.  Kiwi - 1 medium fruit has 240 mg of potassium.  Kohlrabi, rutabaga, parsnips -  cup has 280 mg of potassium.  Lentils -  cup has 365 mg of potassium.  Mango - 1 each has 325 mg of potassium.  Milk (nonfat, low-fat, whole, buttermilk) - 1 cup has 350-380 mg of potassium.  Milk (chocolate) - 1 cup has 420 mg of potassium  Molasses - 1 Tbsp has 295 mg of potassium.  Mushrooms -  cup has 280 mg of potassium.  Nectarine - 1 each has 275 mg of potassium.  Nuts (almonds, peanuts, hazelnuts,  [place], cashew, mixed) - 1 oz has 200 mg of potassium.  Nuts (pistachios) - 1 oz has 295 mg of potassium.  Orange - 1 fruit has 240 mg of potassium.  Orange juice -  cup has 235 mg of potassium.  Papaya -  medium fruit has 390 mg of potassium.  Peanut butter (chunky) - 2 Tbsp has 240 mg of potassium.  Peanut butter (smooth) - 2 Tbsp has 210 mg of potassium.  Pear - 1 medium (200 mg) of potassium.  Pomegranate - 1 whole fruit has 400 mg of potassium.  Pomegranate juice -  cup has 215 mg of potassium.  Pork - 3 oz has 350 mg of potassium.  Potato chips (salted) - 1 oz has 465 mg of potassium.  Potato (baked with skin) - 1 medium has 925 mg of potassium.  Potato (boiled) -  cup has 255 mg of potassium.  Potato (Mashed) -  cup has 330 mg of potassium.  Prune juice -  cup has 370 mg of potassium.  Prunes - 5 have 305 mg of potassium.  Pudding (chocolate) -  cup has 230 mg of potassium.  Pumpkin (canned) -  cup has 250 mg of potassium.  Raisins (seedless) -  cup has 270 mg of potassium.  Seeds (sunflower or pumpkin) - 1 oz has 240 mg of potassium.  Soy milk - 1 cup has 300 mg of potassium.  Spinach (cooked) - 1/2 cup has 420 mg of potassium.  Spinach (canned) -  cup has 370 mg of potassium.  Sweet potato (baked with skin) - 1 medium has 450 mg of potassium.  Swiss chard -  cup has 480 mg of potassium.  Tomato or vegetable juice -  cup has 275 mg of potassium.  Tomato (sauce or puree) -  cup has 400-550 mg of potassium.  Tomato (raw) - 1 medium has 290 mg of potassium.  Tomato (canned) -  cup has 200-300 mg of potassium.  [place] - 3 oz has 250 mg of potassium.  Wheat germ - 1 oz has 250 mg of potassium.  Winter squash -  cup has 250 mg of potassium.  Yogurt (plain or fruited) - 6 oz has 260-435 mg of potassium.  Zucchini -  cup has 220 mg of potassium. Moderate in potassium The following foods and beverages have 50-200 mg of potassium per  serving:  Apple - 1 fruit has 150 mg of potassium  Apple juice -  cup has 150 mg of potassium  Applesauce -  cup has 90 mg of potassium  Apricot nectar -  cup has 140 mg of potassium  Asparagus (  small spears) -  cup has 155 mg of potassium  Asparagus (large spears) - 6 have 155 mg of potassium  Bagel (cinnamon raisin) - 1 four-inch bagel has 130 mg of potassium  Bagel (egg or plain) - 1 four- inch bagel has 70 mg of potassium  Beans (green) -  cup has 90 mg of potassium  Beans (yellow) -  cup has 190 mg of potassium  Beer, regular - 12 oz has 100 mg of potassium  Beets (canned) -  cup has 125 mg of potassium  Blackberries -  cup has 115 mg of potassium  Blueberries -  cup has 60 mg of potassium  Bread (whole wheat) - 1 slice has 70 mg of potassium  Broccoli (raw) -  cup has 145 mg of potassium  Cabbage -  cup has 150 mg of potassium  Carrots (cooked or raw) -  cup has 180 mg of potassium  Cauliflower (raw) -  cup has 150 mg of potassium  Celery (raw) -  cup has 155 mg of potassium  Cereal, bran flakes -  cup has 120-150 mg of potassium  Cheese (cottage) -  cup has 110 mg of potassium  Cherries - 10 have 150 mg of potassium  Chocolate - 1 oz bar has 165 mg of potassium  Coffee (brewed) - 6 oz has 90 mg of potassium  Corn -  cup or 1 ear has 195 mg of potassium  Cucumbers -  cup has 80 mg of potassium  Egg - 1 large egg has 60 mg of potassium  Eggplant -  cup has 60 mg of potassium  Endive (raw) -  cup has 80 mg of potassium  English muffin - 1 has 65 mg of potassium  Fish (ocean perch) - 3 oz has 192 mg of potassium  Frankfurter, beef or pork - 1 has 75 mg of potassium  Fruit cocktail -  cup has 115 mg of potassium  Grape juice -  cup has 170 mg of potassium  Grapefruit -  fruit has 175 mg of potassium  Grapes -  cup has 155 mg of potassium  Greens: kale, turnip, collard -  cup has 110-150 mg of potassium  Ice  cream or frozen yogurt (chocolate) -  cup has 175 mg of potassium  Ice cream or frozen yogurt (vanilla) -  cup has 120-150 mg of potassium  Lemons, limes - 1 each has 80 mg of potassium  Lettuce - 1 cup has 100 mg of potassium  Mixed vegetables -  cup has 150 mg of potassium  Mushrooms, raw -  cup has 110 mg of potassium  Nuts (walnuts, pecans, or macadamia) - 1 oz has 125 mg of potassium  Oatmeal -  cup has 80 mg of potassium  Okra -  cup has 110 mg of potassium  Onions -  cup has 120 mg of potassium  Peach - 1 has 185 mg of potassium  Peaches (canned) -  cup has 120 mg of potassium  Pears (canned) -  cup has 120 mg of potassium  Peas, green (frozen) -  cup has 90 mg of potassium  Peppers (Green) -  cup has 130 mg of potassium  Peppers (Red) -  cup has 160 mg of potassium  Pineapple juice -  cup has 165 mg of potassium  Pineapple (fresh or canned) -  cup has 100 mg of potassium  Plums - 1 has 105 mg of potassium  Pudding, vanilla -  cup has 150 mg of potassium  Raspberries -  cup has 90 mg of potassium  Rhubarb -  cup has 115 mg of potassium  Rice, wild -  cup has 80 mg of potassium  Shrimp - 3 oz has 155 mg of potassium  Spinach (raw) - 1 cup has 170 mg of potassium  Strawberries -  cup has 125 mg of potassium  Summer squash -  cup has 175-200 mg of potassium  Swiss chard (raw) - 1 cup has 135 mg of potassium  Tangerines - 1 fruit has 140 mg of potassium  Tea, brewed - 6 oz has 65 mg of potassium  Turnips -  cup has 140 mg of potassium  Watermelon -  cup has 85 mg of potassium  Wine (Red, table) - 5 oz has 180 mg of potassium  Wine (White, table) - 5 oz 100 mg of potassium Low in potassium The following foods and beverages have less than 50 mg of potassium per serving.  Bread (white) - 1 slice has 30 mg of potassium  Carbonated beverages - 12 oz has less than 5 mg of potassium  Cheese - 1 oz has 20-30 mg of potassium   Cranberries -  cup has 45 mg of potassium  Cranberry juice cocktail -  cup has 20 mg of potassium  Fats and oils - 1 Tbsp has less than 5 mg of potassium  Hummus - 1 Tbsp has 32 mg of potassium  Nectar (papaya, mango, or pear) -  cup has 35 mg of potassium  Rice (white or brown) -  cup has 50 mg of potassium  Spaghetti or macaroni (cooked) -  cup has 30 mg of potassium  Tortilla, flour or corn - 1 has 50 mg of potassium  Waffle - 1 four-inch waffle has 50 mg of potassium  Water chestnuts -  cup has 40 mg of potassium Summary  Potassium is a mineral found in many foods and drinks. It affects how the heart works, and helps keep fluids and minerals balanced in the body.  The amount of potassium you need each day depends on your age and any existing medical conditions you may have. Your health care provider or dietitian may recommend an amount of potassium that you should have each day. This information is not intended to replace advice given to you by your health care provider. Make sure you discuss any questions you have with your health care provider. Document Released: 07/05/2005 Document Revised: 11/03/2017 Document Reviewed: 02/15/2017 Elsevier Patient Education  [place].    Anemia  Anemia is a condition in which you do not have enough red blood cells or hemoglobin. Hemoglobin is a substance in red blood cells that carries oxygen. When you do not have enough red blood cells or hemoglobin (are anemic), your body cannot get enough oxygen and your organs may not work properly. As a result, you may feel very tired or have other problems. What are the causes? Common causes of anemia include:  Excessive bleeding. Anemia can be caused by excessive bleeding inside or outside the body, including bleeding from the intestine or from periods in women.  Poor nutrition.  Long-lasting (chronic) kidney, thyroid, and liver disease.  Bone marrow disorders.  Cancer and  treatments for cancer.  HIV (human immunodeficiency virus) and AIDS (acquired immunodeficiency syndrome).  Treatments for HIV and AIDS.  Spleen problems.  Blood disorders.  Infections, medicines, and autoimmune disorders that destroy red blood cells. What are  the signs or symptoms? Symptoms of this condition include:  Minor weakness.  Dizziness.  Headache.  Feeling heartbeats that are irregular or faster than normal (palpitations).  Shortness of breath, especially with exercise.  Paleness.  Cold sensitivity.  Indigestion.  Nausea.  Difficulty sleeping.  Difficulty concentrating. Symptoms may occur suddenly or develop slowly. If your anemia is mild, you may not have symptoms. How is this diagnosed? This condition is diagnosed based on:  Blood tests.  Your medical history.  A physical exam.  Bone marrow biopsy. Your health care provider may also check your stool (feces) for blood and may do additional testing to look for the cause of your bleeding. You may also have other tests, including:  Imaging tests, such as a CT scan or MRI.  Endoscopy.  Colonoscopy. How is this treated? Treatment for this condition depends on the cause. If you continue to lose a lot of blood, you may need to be treated at a hospital. Treatment may include:  Taking supplements of iron, vitamin M62, or folic acid.  Taking a hormone medicine (erythropoietin) that can help to stimulate red blood cell growth.  Having a blood transfusion. This may be needed if you lose a lot of blood.  Making changes to your diet.  Having surgery to remove your spleen. Follow these instructions at home:  Take over-the-counter and prescription medicines only as told by your health care provider.  Take supplements only as told by your health care provider.  Follow any diet instructions that you were given.  Keep all follow-up visits as told by your health care provider. This is important.  Contact a health care provider if:  You develop new bleeding anywhere in the body. Get help right away if:  You are very weak.  You are short of breath.  You have pain in your abdomen or chest.  You are dizzy or feel faint.  You have trouble concentrating.  You have bloody or black, tarry stools.  You vomit repeatedly or you vomit up blood. Summary  Anemia is a condition in which you do not have enough red blood cells or enough of a substance in your red blood cells that carries oxygen (hemoglobin).  Symptoms may occur suddenly or develop slowly.  If your anemia is mild, you may not have symptoms.  This condition is diagnosed with blood tests as well as a medical history and physical exam. Other tests may be needed.  Treatment for this condition depends on the cause of the anemia. This information is not intended to replace advice given to you by your health care provider. Make sure you discuss any questions you have with your health care provider. Document Released: 12/29/2004 Document Revised: 11/03/2017 Document Reviewed: 12/23/2016 Elsevier Patient Education  2020 [employer].

## 2019-09-11 NOTE — Progress Notes (Signed)
[id number]

## 2019-09-11 NOTE — Progress Notes (Signed)
[facility] Initial Psychosocial Assessment Clinical Social Work  Clinical Social Work contacted by phone to assess psychosocial, emotional, mental health, and spiritual needs of the patient.   Barriers to care/review of distress screen:  - Transportation:  Do you anticipate any problems getting to appointments?  Do you have someone who can help run errands for you if you need it?  Lately it has been a struggle Didn't drive to today's appointment "because I didn't have any gas."   - Help at home:  What is your living situation (alone, family, other)?  If you are physically unable to care for yourself, who would you call on to help you?  Lives alone w her children (17, 81, 31).  "They try to be helpful."   - Support system:  What does your support system look like?  Who would you call on if you needed some kind of practical help?  What if you needed someone to talk to for emotional support?  Asks family/friends for help, applied for Food Stamps but was ineligible as she was $1 over limit.  Used July check stubs when she was working full time.   - Finances:  Are you concerned about finances.  Considering returning to work?  If not, applying for disability?  Struggling w working, has reduced her hours as result of treatment.  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  Applied for disability last month over the phone w [facility].  Is making her bills, but it is very tight and bills are often overdue.    What is your understanding of where you are with your cancer? Its cause?  Your treatment plan and what happens next?  Cancer recurred this summer, now in chemo for 4 - 6 months total.  "Sometimes I have to lay down and rest."  Is not able to work much in midst of treatment - now down to 2 - 3 days/week of work.    What are your worries for the future as you begin treatment for cancer?  Not being able to provide financially for her family - afraid she will not be able to work and will  not get disability in time.    What are your hopes and priorities during your treatment? What is important to you? What are your goals for your care?  "I have no choice other than to work, I have to do what I have to do."  No child support.  Relies on friends/family  Who help out as they are able  CSW Summary:  Patient and family psychosocial functioning including strengths, limitations, and coping skills:  Referral from Thompson Caul, LPN - pt requests to see CSW while in infusion.  Spoke w her by phone as CSW Wallis Bamberg was covering on site needs.  PT wants gas card.  Took opportunity to assess for other needs.  Is single mother of 3 adolescents, all home and doing virtual school at this time.  Reduced income due to impact of new diagnosis of metastatic breast cancer.  Feels that she has to continue to work as she is sole provider for family.  Anticipates 4 - 6 months of chemo to treat this recurrance.    Identifications of barriers to care:  Limited income, stress from needing to work vs being in treatment  Availability of community resources:  Has Medicaid, denied for [employer] as over income by $1.  Referred to [facility] [employer] [facility], CSW Wallis Bamberg provided one time  gift card.  Can enroll in [place] at subsequent visits.  Referred to [facility] as she is struggling to afford gas to come to treatments.  Sent information on [facility] and [place] also asked to print/provide these applications as patient does not have printer at home.  Encouraged to participate in online support groups at [facility].    Clinical Social Worker follow up needed: Yes.    Will schedule follow up call and help w applications and support as needed.    Edwyna Shell, LCSW Clinical Social Worker Phone:  [phone] Cell:  [phone]

## 2019-09-11 NOTE — Progress Notes (Signed)
[facility] CSW Progress Notes  Referred to Transportation for help w getting to appointments.  Appt set to follow up w patient re financial aid applications given by CSW Section today.  Edwyna Shell, LCSW Clinical Social Worker Phone:  [phone]

## 2019-09-11 NOTE — Progress Notes (Addendum)
[facility]  [phone] [phone]   ID: [name] DOB: 28-Jan-1976  MR#: [id number]  GOT#:[id number]  Patient Care Team: Dorena Dew, FNP as PCP - General (Family Medicine) Alphonsa Overall, MD as Consulting Physician (General Surgery) Magrinat, [name], MD as Consulting Physician (Oncology) Gery Pray, MD as Consulting Physician (Radiation Oncology) Delice Bison, Charlestine Massed, NP as Nurse Practitioner (Hematology and Oncology) Alda Berthold, DO as Consulting Physician (Neurology) OTHER MD:  CHIEF COMPLAINT: triple negative stage IV breast cancer  CURRENT TREATMENT: carboplatin/ gemcitabine; zoledronate   INTERVAL HISTORY: Lazara returns today for follow-up and treatment of her recurrent triple negative breast cancer. She was last seen here on 07/17/2019.   She continues on carboplatin and gemcitabine.   She receives treatment on days 1 and 8 of each 21-day cycle.  She is currently cycle 4 day 1 of treatment.    Since her last visit, she underwent restaging with CT chest/abdomen/pelvis, insurance would not approve a PET scan.    She also continues on zolendronate.  Her most recent dose was 07/17/2019 and she receives this every 12 weeks.  REVIEW OF SYSTEMS: Correen is doing well today.  She has some constipation.  She manages this with stool softeners.  She also has increasing right jaw paint and is taking tramadol at night to help with this pain.  She notes that she had some dental issues and needed extractions and work prior to her recurrence, however that was put on hold once she was diagnosed with recurrence and had to start chemotherapy.  She is working with her dentist to get in with an oral surgeon to get this taken care of due to the increase in pain she has been recently experiencing.  She wants to know when she can schedule this work.    Freddye notes she overall has been tolerating the chemotherapy well.  She is without fever or chills.   She is minimally fatigued.  She has no nausea or vomiting.  She is without cough, shortness of breath, bowel/bladder changes, palpitations, or chest pain.  A detailed ROS was otherwise non contributory.    BREAST CANCER HISTORY: From the original intake note:  Dayra herself noted a change in her right breast sometime around September or October 2016. She did not bring it tto intermediate medical attention, but on 01/13/2016 she established herself in Dr. Smith Robert' service and she was set up for bilateral diagnostic mammography with tomosynthesis and bilateral ultrasonography at the [facility] 01/19/2016. The breast density was category B. In the upper outer quadrant of the right breast there was a spiculated mass measuring 2.8 cm. On physical exam this was palpable. Targeted ultrasonography confirmed an irregular hypoechoic mass in the right breast 11:30 o'clock position measuring 2.6 cm maximally. Ultrasound of the right axilla showed a morphologically abnormal lymph node.  In the left breast there were some tubular densities behind the areola which by ultrasonography showed benign ductal ectasia.  On 01/28/2016 Johnae underwent biopsy of the right breast mass and abnormal right axillary lymph node. The pathology from this procedure (S [id number]) showed the lymph node to be benign. In the breast however there was an invasive ductal carcinoma, grade 3, which was estrogen and progesterone receptor negative. The proliferation marker was 70%. HER-2 was not amplified with a signals ratio of 1.32. The number per cell was 2.05.  The patient's subsequent history is as detailed below   PAST MEDICAL HISTORY: Past Medical History:  Diagnosis Date   Anxiety    Breast cancer ([place])    Depression    GERD (gastroesophageal reflux disease)    History of radiation therapy 11/15/16-01/12/17   right chest wall and axilla treated to 45 Gy in 25 fractions, boosted and additional 14 Gy in 8 fractions    Hypertension    diet controlled   Obesity (BMI 35.0-39.9 without comorbidity)    Pneumonia    as a child   Seasonal allergies    Sickle cell trait ([place])    Termination of pregnancy (fetus) 04/02/16    PAST SURGICAL HISTORY: Past Surgical History:  Procedure Laterality Date   CESAREAN SECTION     2004 and 2007   MASTECTOMY W/ SENTINEL NODE BIOPSY Right 09/06/2016   Procedure: RIGHT BREAST MASTECTOMY WITH RIGHT AXILLARY SENTINEL LYMPH NODE BIOPSY;  Surgeon: Alphonsa Overall, MD;  Location: [facility];  Service: General;  Laterality: Right;   PORT-A-CATH REMOVAL Left 09/06/2016   Procedure: REMOVAL PORT-A-CATH;  Surgeon: Alphonsa Overall, MD;  Location: [facility];  Service: General;  Laterality: Left;   PORTACATH PLACEMENT     PORTACATH PLACEMENT N/A 07/09/2019   Procedure: INSERTION PORT-A-CATH WITH ULTRASOUND;  Surgeon: Alphonsa Overall, MD;  Location: [facility];  Service: General;  Laterality: N/A;    FAMILY HISTORY Family History  Problem Relation Age of Onset   Hypertension Mother    Cancer Mother        dx "intestinal cancer" in her 67s; +surgery   Other Mother        hysterectomy at young age for unspecified cause   Heart Problems Mother    Breast cancer Cousin        maternal 1st cousin dx female breast cancer at 38-46y   Cancer Father    Hypertension Father    Heart Problems Maternal Aunt    Diabetes Maternal Aunt    Breast cancer Maternal Uncle        dx 64-65   Heart Problems Maternal Uncle    Breast cancer Maternal Grandmother 50   Throat cancer Maternal Grandfather        d. 93s; smoker   Sickle cell anemia Paternal Aunt    Congestive Heart Failure Maternal Aunt    Multiple sclerosis Cousin    Cancer Other        maternal great uncle (MGM's brother); cancer removed from his side   Heart attack Paternal Aunt        d. early 64s  The patient has very little information about her father. Her mother is currently 61 years  old. She had a history of cervical cancer at age 32. The patient had 2 brothers, no sisters. The maternal grandfather had throat cancer. A maternal uncle was diagnosed with breast cancer as well as prostate cancer at the age of 106. 2 maternal cousins, one of the mail, had breast cancer as well.   GYNECOLOGIC HISTORY:  No LMP recorded. Menarche age 62, first live birth age 73. The patient is GX P4. She was still having regular periods at the time of diagnosis. She took oral contraceptives in the 1990s with no side effects.--.  Stopped with chemotherapy and have not resumed as of May 2019   SOCIAL HISTORY:  (Updated 10/2018) She works as a Market researcher. The patient's significant other [name] works at [employer].  At home with the patient are her 3 children [name], Prescott and Dandridge. There are  age 73, 53, and 83 as of November 2019.  2 of them are disabled or have significant health problems, one with sickle cell disease, the other with autism and developmental delay.  The patient's son [name], currently 20 years old, lives in [place].    ADVANCED DIRECTIVES: Not in place   HEALTH MAINTENANCE: Social History   Socioeconomic History   Marital status: Single    Spouse name: Not on file   Number of children: 4   Years of education: Not on file   Highest education level: Not on file  Occupational History   Occupation: Private Care Attendant    Comment: First choice [employer] resource strain: Not on file   Food insecurity    Worry: Never true    Inability: Never true   Transportation needs    Medical: No    Non-medical: No  Tobacco Use   Smoking status: Former Smoker    Packs/day: 1.00    Years: 20.00    Pack years: 20.00    Types: Cigarettes    Quit date: 04/01/2018    Years since quitting: 1.4   Smokeless tobacco: Never Used   Tobacco comment: Patient has quit smoking x 1 year now    Substance and Sexual Activity   Alcohol use: Yes    Comment: occ   Drug use: No   Sexual activity: Not on file  Lifestyle   Physical activity    Days per week: 7 days    Minutes per session: 60 min   Stress: Not at all  Relationships   Social connections    Talks on phone: Not on file    Gets together: Not on file    Attends religious service: Not on file    Active member of club or organization: Not on file    Attends meetings of clubs or organizations: Not on file    Relationship status: Not on file  Other Topics Concern   Not on file  Social History Narrative   Not on file     Colonoscopy:  PAP:  Bone density:  Lipid panel:  No Known Allergies  Current Outpatient Medications on File Prior to Visit  Medication Sig Dispense Refill   calcium carbonate (TUMS - DOSED IN MG ELEMENTAL CALCIUM) 500 MG chewable tablet Chew 1 tablet by mouth daily as needed for indigestion or heartburn.     dexamethasone (DECADRON) 4 MG tablet Take 2 tablets (8 mg total) by mouth daily. Start the day after chemotherapy for 2 days. Take with food. 30 tablet 1   lidocaine-prilocaine (EMLA) cream Apply to affected area once 30 g 3   LORazepam (ATIVAN) 0.5 MG tablet Take 1 tablet (0.5 mg total) by mouth daily as needed for anxiety. 30 tablet 0   metroNIDAZOLE (FLAGYL) 500 MG tablet Take 1 tablet (500 mg total) by mouth 2 (two) times daily. Crush one tablet and apply to affected site two times a day with dressing changes 60 tablet 1   omeprazole (PRILOSEC) 20 MG capsule Take 1 capsule (20 mg total) by mouth daily. 30 capsule 1   prochlorperazine (COMPAZINE) 10 MG tablet Take 1 tablet (10 mg total) by mouth every 6 (six) hours as needed (Nausea or vomiting). 30 tablet 1   traMADol (ULTRAM) 50 MG tablet Take 1 tablet (50 mg total) by mouth every 6 (six) hours as needed. 30 tablet 0   No current facility-administered medications on file prior to  visit.     OBJECTIVE:   Vitals:    09/11/19 1115  BP: 123/78  Pulse: 98  Resp: 17  Temp: 98.5 F (36.9 C)  SpO2: 100%   Wt Readings from Last 3 Encounters:  09/11/19 210 lb (95.3 kg)  09/04/19 205 lb 8 oz (93.2 kg)  08/21/19 208 lb 11.2 oz (94.7 kg)   Body mass index is 39.04 kg/m.    ECOG FS:1 - Symptomatic but completely ambulatory GENERAL: Patient is a well appearing female in no acute distress HEENT:  Sclerae anicteric.  Oropharynx clear and moist. No ulcerations or evidence of oropharyngeal candidiasis. Neck is supple.  NODES:  No cervical, supraclavicular, or axillary lymphadenopathy palpated. BREAST EXAM: see picture below LUNGS:  Clear to auscultation bilaterally.  No wheezes or rhonchi. HEART:  Regular rate and rhythm. No murmur appreciated. ABDOMEN:  Soft, nontender.  Positive, normoactive bowel sounds. No organomegaly palpated. MSK:  No focal spinal tenderness to palpation. Full range of motion bilaterally in the upper extremities. EXTREMITIES:  No peripheral edema.   SKIN:  Clear with no obvious rashes or skin changes. No nail dyscrasia. NEURO:  Nonfocal. Well oriented.  Appropriate affect.   Right chest wall on 09/04/2019   Right chest wall on 09/11/2019     LAB RESULTS: CMP Latest Ref Rng & Units 08/28/2019 08/21/2019 08/07/2019  Glucose 70 - 99 mg/dL 139(H) 91 104(H)  BUN 6 - 20 mg/dL _0 Creatinine 0.44 - 1.00 mg/dL 0.83 0.77 0.73  Sodium 135 - 145 mmol/L 143 142 143  Potassium 3.5 - 5.1 mmol/L 3.4(L) 3.7 3.8  Chloride 98 - 111 mmol/L 103 107 108  CO2 22 - 32 mmol/L _1 Calcium 8.9 - 10.3 mg/dL 9.7 8.5(L) 8.9  Total Protein 6.5 - 8.1 g/dL 7.4 7.3 7.3  Total Bilirubin 0.3 - 1.2 mg/dL <0.2(L) <0.2(L) <0.2(L)  Alkaline Phos 38 - 126 U/L 68 65 84  AST 15 - 41 U/L _2 ALT 0 - 44 U/L 29 23 46(H)     CBC    Component Value Date/Time   WBC 5.8 08/28/2019 1300   RBC 2.98 (L) 08/28/2019 1300   HGB 8.5 (L) 08/28/2019 1300   HGB 11.1 (L) 06/04/2019 1218   HGB 10.4 (L)  10/04/2016 1154   HCT 25.0 (L) 08/28/2019 1300   HCT 31.3 (L) 10/04/2016 1154   PLT 251 08/28/2019 1300   PLT 243 06/04/2019 1218   PLT 320 10/04/2016 1154   MCV 83.9 08/28/2019 1300   MCV 95.7 10/04/2016 1154   MCH 28.5 08/28/2019 1300   MCHC 34.0 08/28/2019 1300   RDW 16.8 (H) 08/28/2019 1300   RDW 16.3 (H) 10/04/2016 1154   LYMPHSABS 2.3 08/28/2019 1300   LYMPHSABS 1.7 10/04/2016 1154   MONOABS 0.5 08/28/2019 1300   MONOABS 0.5 10/04/2016 1154   EOSABS 0.0 08/28/2019 1300   EOSABS 0.3 10/04/2016 1154   BASOSABS 0.0 08/28/2019 1300   BASOSABS 0.0 10/04/2016 1154    STUDIES: Ct Chest W Contrast  Result Date: 09/05/2019 CLINICAL DATA:  Recurrent breast cancer, status post mastectomy EXAM: CT CHEST, ABDOMEN, AND PELVIS WITH CONTRAST TECHNIQUE: Multidetector CT imaging of the chest, abdomen and pelvis was performed following the standard protocol during bolus administration of intravenous contrast. CONTRAST:  143m OMNIPAQUE IOHEXOL 300 MG/ML SOLN, additional oral enteric contrast COMPARISON:  PET-CT, 06/19/2019, CT chest, 06/11/2019 FINDINGS: CT CHEST FINDINGS Cardiovascular: Left chest port catheter. Normal heart size. No pericardial effusion.  Mediastinum/Nodes: No change in a right axillary lymph node or soft tissue nodule measuring 1.2 x 0.7 cm (series 2, image 21). Interval decrease in size of an enlarged left axillary lymph node measuring 2.5 x 1.2 cm, previously 3.0 x 1.6 cm when measured similarly (series 2, image 15). Interval decrease in size of a right internal mammary lymph node, measuring approximately 0.6 x 0.6 cm, previously 1.5 x 1.1 cm (series 2, image 15). Thyroid gland, trachea, and esophagus demonstrate no significant findings. Lungs/Pleura: Lungs are clear. No pleural effusion or pneumothorax. Musculoskeletal: Status post right mastectomy. Redemonstrated spiculated appearing soft tissue mass of the right pectoralis major muscle margin, difficult to accurately measure  although approximately 3.0 x 2.5 cm, unchanged compared to prior examination (series 2, image 13). CT ABDOMEN PELVIS FINDINGS Hepatobiliary: No solid liver abnormality is seen. Hepatic steatosis. No gallstones, gallbladder wall thickening, or biliary dilatation. Pancreas: Unremarkable. No pancreatic ductal dilatation or surrounding inflammatory changes. Spleen: Normal in size without significant abnormality. Adrenals/Urinary Tract: Adrenal glands are unremarkable. Kidneys are normal, without renal calculi, solid lesion, or hydronephrosis. Bladder is unremarkable. Stomach/Bowel: Stomach is within normal limits. Appendix appears normal. No evidence of bowel wall thickening, distention, or inflammatory changes. Vascular/Lymphatic: No significant vascular findings are present. No enlarged abdominal or pelvic lymph nodes. Reproductive: No mass or other abnormality. Other: No abdominal wall hernia or abnormality. No abdominopelvic ascites. Musculoskeletal: No acute or significant osseous findings. IMPRESSION: 1. Redemonstrated spiculated appearing soft tissue mass of the right pectoralis major muscle margin status post right mastectomy, difficult to accurately measure although approximately 3.0 x 2.5 cm, unchanged compared to prior examination (series 2, image 13). 2. No change in a right axillary lymph node or soft tissue nodule measuring 1.2 x 0.7 cm (series 2, image 21). 3. Interval decrease in size of an enlarged left axillary lymph node measuring 2.5 x 1.2 cm, previously 3.0 x 1.6 cm when measured similarly (series 2, image 15). Interval decrease in size of a right internal mammary lymph node, measuring approximately 0.6 x 0.6 cm, previously 1.5 x 1.1 cm (series 2, image 15). Overall findings are consistent with a mixed response to treatment. PET-CT may be helpful to assess for response of primary mass to treatment. 4.  No evidence of the metastatic disease in the abdomen or pelvis. Electronically Signed   By: Eddie Candle M.D.   On: 09/05/2019 15:24   Ct Abdomen Pelvis W Contrast  Result Date: 09/05/2019 CLINICAL DATA:  Recurrent breast cancer, status post mastectomy EXAM: CT CHEST, ABDOMEN, AND PELVIS WITH CONTRAST TECHNIQUE: Multidetector CT imaging of the chest, abdomen and pelvis was performed following the standard protocol during bolus administration of intravenous contrast. CONTRAST:  1106m OMNIPAQUE IOHEXOL 300 MG/ML SOLN, additional oral enteric contrast COMPARISON:  PET-CT, 06/19/2019, CT chest, 06/11/2019 FINDINGS: CT CHEST FINDINGS Cardiovascular: Left chest port catheter. Normal heart size. No pericardial effusion. Mediastinum/Nodes: No change in a right axillary lymph node or soft tissue nodule measuring 1.2 x 0.7 cm (series 2, image 21). Interval decrease in size of an enlarged left axillary lymph node measuring 2.5 x 1.2 cm, previously 3.0 x 1.6 cm when measured similarly (series 2, image 15). Interval decrease in size of a right internal mammary lymph node, measuring approximately 0.6 x 0.6 cm, previously 1.5 x 1.1 cm (series 2, image 15). Thyroid gland, trachea, and esophagus demonstrate no significant findings. Lungs/Pleura: Lungs are clear. No pleural effusion or pneumothorax. Musculoskeletal: Status post right mastectomy. Redemonstrated spiculated appearing soft tissue mass  of the right pectoralis major muscle margin, difficult to accurately measure although approximately 3.0 x 2.5 cm, unchanged compared to prior examination (series 2, image 13). CT ABDOMEN PELVIS FINDINGS Hepatobiliary: No solid liver abnormality is seen. Hepatic steatosis. No gallstones, gallbladder wall thickening, or biliary dilatation. Pancreas: Unremarkable. No pancreatic ductal dilatation or surrounding inflammatory changes. Spleen: Normal in size without significant abnormality. Adrenals/Urinary Tract: Adrenal glands are unremarkable. Kidneys are normal, without renal calculi, solid lesion, or hydronephrosis. Bladder is  unremarkable. Stomach/Bowel: Stomach is within normal limits. Appendix appears normal. No evidence of bowel wall thickening, distention, or inflammatory changes. Vascular/Lymphatic: No significant vascular findings are present. No enlarged abdominal or pelvic lymph nodes. Reproductive: No mass or other abnormality. Other: No abdominal wall hernia or abnormality. No abdominopelvic ascites. Musculoskeletal: No acute or significant osseous findings. IMPRESSION: 1. Redemonstrated spiculated appearing soft tissue mass of the right pectoralis major muscle margin status post right mastectomy, difficult to accurately measure although approximately 3.0 x 2.5 cm, unchanged compared to prior examination (series 2, image 13). 2. No change in a right axillary lymph node or soft tissue nodule measuring 1.2 x 0.7 cm (series 2, image 21). 3. Interval decrease in size of an enlarged left axillary lymph node measuring 2.5 x 1.2 cm, previously 3.0 x 1.6 cm when measured similarly (series 2, image 15). Interval decrease in size of a right internal mammary lymph node, measuring approximately 0.6 x 0.6 cm, previously 1.5 x 1.1 cm (series 2, image 15). Overall findings are consistent with a mixed response to treatment. PET-CT may be helpful to assess for response of primary mass to treatment. 4.  No evidence of the metastatic disease in the abdomen or pelvis. Electronically Signed   By: Eddie Candle M.D.   On: 09/05/2019 15:24    RESEARCH: Referred to PREVENT study, but was pregnant at the time; referred to [facility] 11202, but the biopsied lymph node was benign; referred to weight loss study but declined; referred to [facility] 12/09/2000, but the timing of radiation and 8 was greater than 60 days past the date of diagnosis; referred to health disparity study, but declined; referred to [id number] adjuvant therapy study, but the patient received Xeloda with radiation and therefore was ineligible   ASSESSMENT: 43 y.o. [place] woman  status post right breast upper-outer quadrant biopsy 01/28/2016 for a clinical T2 N0 invasive ductal carcinoma, grade 3, triple negative, with an MIB-1 of 70%.  (a) suspicious right axillary lymph node biopsied 01/28/2016 was benign  (1) neoadjuvant chemotherapy: doxorubicin and cyclophosphamide in dose dense fashion 4 started 04/14/16, completed 05/26/2016, followed by paclitaxel and carboplatin weekly 12, Started 06/09/2016  (a) taxol discontinued after 7 doses because of neuropathy, last dose 07/21/2016  (2) genetics testing October 20, 2016 through the 32-gene Comprehensive Cancer Panel offered by [facility] Junius Roads, MD) (with MSH2 Exons 1-7 Inversion Analysis) found no deleterious mutations or VUSS  In APC, ATM, AXIN2, BARD1, BMPR1A, BRCA1, BRCA2, BRIP1, CDH1, CDK4, CDKN2A, CHEK2, EPCAM, FANCC, MLH1, MSH2, MSH6, MUTYH, NBN, PALB2, PMS2, POLD1, POLE, PTEN, RAD51C, RAD51D, SCG5/GREM1, SMAD4, STK11, TP53, VHL, and XRCC2.    (3) right mastectomy and sentinel lymph node sampling 09/06/2016 showed a residual ypT1c ypN0, invasive ductal carcinoma, grade 3, with negative margins. Repeat prognostic panel again triple negative   (4) adjuvant radiation with capecitabine/Xeloda sensitization 11/15/16 - 01/12/17 Site/dose:   Right Chest Wall and axilla (4 field) treated to 45 Gy in 25 fractions, and then Boosted an additional 14.4 Gy in 8  fractions.  (5) tobacco abuse disorder: The patient quit smoking 04/04/2018  METASTATIC DISEASE:  July 2020 (6) nonspecific changes noted on chest CT 06/11/2019 were clarified by PET scan 06/19/2019 showing hypermetabolic disease in the right anterior chest wall, right internal mammary nodes, right and left axillary nodes, but no metastatic disease in the neck, lungs, abdomen or pelvis.  Bone marrow uptake suggests bony metastatic disease.  (a) CARIS requested obtained from 06/28/2019 sample confirmed a triple negativity, the tumor also was negative for the  androgen receptor, was MSI stable and mismatch repair status proficient, with a low mutational burden.  BRCA 1 and 2 were negative and PD-L1 was negative.  PI K3 showed a variant of uncertain significance.  However the tumor was genomic LOH high  (b) CA-27-29 is informative: was 118.3 on 06/04/2019  (7) zoledronate started 07/17/2019--on hold currently due to dental issues, and until 6 weeks at least after dental work  (8) carboplatin/ gemcitabine days 1 and 8 Q21 day cycle started 07/10/2019   PLAN: Daneya is doing well today.  She met with myself and Dr. [name], who also examined her mastectomy site.  The chest wall remains stable to improved, and her scans show stability in some areas, and improvement in others.  She is tolerating her treatments well.  She will continue on therapy with the Gemcitabine and Carboplatin.  She will see [place] on day 1 of her 21 day cycle, and she will return on day 8 for treatment alone.  Due to her dental issues, she was recommended to have any work done during her off week of chemotherapy, towards the end of that time period.  For instance.  She receives chemotherapy on days 1 and 8 of a 21 day cycle, then we would recommend she try to have the dental work between days 17-20.  I gave her a couple of dates to aim for, and she will let us know what she gets arranged.  Due to the dental issues, the Zoledronate is currently on hold.   I reviewed Isla's labs.  Her hemoglobin is mildly decreased.  We will monitor this closely., she is asymptomatic and we will transfuse for hemoglobin less than 7.     Tinya and I talked about healthy diet and exercise.  She will continue this.    We will see Lynore back on 10/28 for cycle 5 of treatment.  She understands her plan.  We will restage her again after 6 cycles of treatment.  She was recommended to continue with the appropriate pandemic precautions. She knows to call for any questions that may arise between now and her next  appointment.  We are happy to see her sooner if needed.    Wilber Bihari, NP  09/11/19 11:26 AM Medical Oncology and Hematology [facility] [address] [place], [place] [address] Tel. [phone]    Fax. [phone]    ADDENDUM:  Keisha's cancer is stable to slightly improved on the current treatment.  She is tolerating it well.  The plan is to continue this for another 3 months and then again restage.  I am concerned that we have limited treatment possibilities for this young woman who at the end of her initial treatment had already received the most active agents in breast cancer at adequate doses, despite which her tumor recurred.  Unfortunately the Caris molecular study does not suggest an obvious alternative.  It may not be a bad idea to start looking for studies once we  document progression.  I personally saw this patient and performed a substantive portion of this encounter with the listed APP documented above.   Chauncey Cruel, MD Medical Oncology and Hematology [facility] [facility] [address] [place], [place] [address] Tel. [phone]    Fax. [phone]

## 2019-09-12 ENCOUNTER — Telehealth: Payer: Self-pay | Admitting: Adult Health

## 2019-09-12 LAB — CANCER ANTIGEN 27.29: CA 27.29: 139.2 U/mL — ABNORMAL HIGH (ref 0.0–38.6)

## 2019-09-12 NOTE — Telephone Encounter (Signed)
I left a message regarding schedule  

## 2019-09-18 ENCOUNTER — Inpatient Hospital Stay: Payer: Medicaid Other | Admitting: General Practice

## 2019-09-18 ENCOUNTER — Telehealth: Payer: Self-pay | Admitting: *Deleted

## 2019-09-18 ENCOUNTER — Inpatient Hospital Stay: Payer: Medicaid Other

## 2019-09-18 ENCOUNTER — Other Ambulatory Visit: Payer: Self-pay

## 2019-09-18 VITALS — BP 109/81 | Temp 98.3°F | Resp 17 | Wt 207.2 lb

## 2019-09-18 VITALS — BP 108/63 | HR 80 | Temp 97.8°F | Resp 16

## 2019-09-18 DIAGNOSIS — C50411 Malignant neoplasm of upper-outer quadrant of right female breast: Secondary | ICD-10-CM

## 2019-09-18 DIAGNOSIS — C50919 Malignant neoplasm of unspecified site of unspecified female breast: Secondary | ICD-10-CM

## 2019-09-18 DIAGNOSIS — C7951 Secondary malignant neoplasm of bone: Secondary | ICD-10-CM

## 2019-09-18 DIAGNOSIS — Z171 Estrogen receptor negative status [ER-]: Secondary | ICD-10-CM

## 2019-09-18 DIAGNOSIS — Z5111 Encounter for antineoplastic chemotherapy: Secondary | ICD-10-CM | POA: Diagnosis not present

## 2019-09-18 LAB — CBC WITH DIFFERENTIAL/PLATELET
Abs Immature Granulocytes: 0.04 10*3/uL (ref 0.00–0.07)
Basophils Absolute: 0 10*3/uL (ref 0.0–0.1)
Basophils Relative: 1 %
Eosinophils Absolute: 0 10*3/uL (ref 0.0–0.5)
Eosinophils Relative: 0 %
HCT: 22.3 % — ABNORMAL LOW (ref 36.0–46.0)
Hemoglobin: 7.5 g/dL — ABNORMAL LOW (ref 12.0–15.0)
Immature Granulocytes: 1 %
Lymphocytes Relative: 42 %
Lymphs Abs: 2.4 10*3/uL (ref 0.7–4.0)
MCH: 29 pg (ref 26.0–34.0)
MCHC: 33.6 g/dL (ref 30.0–36.0)
MCV: 86.1 fL (ref 80.0–100.0)
Monocytes Absolute: 0.4 10*3/uL (ref 0.1–1.0)
Monocytes Relative: 7 %
Neutro Abs: 2.9 10*3/uL (ref 1.7–7.7)
Neutrophils Relative %: 49 %
Platelets: 175 10*3/uL (ref 150–400)
RBC: 2.59 MIL/uL — ABNORMAL LOW (ref 3.87–5.11)
RDW: 19.5 % — ABNORMAL HIGH (ref 11.5–15.5)
WBC: 5.7 10*3/uL (ref 4.0–10.5)
nRBC: 0.3 % — ABNORMAL HIGH (ref 0.0–0.2)

## 2019-09-18 LAB — COMPREHENSIVE METABOLIC PANEL
ALT: 25 U/L (ref 0–44)
AST: 20 U/L (ref 15–41)
Albumin: 4.1 g/dL (ref 3.5–5.0)
Alkaline Phosphatase: 70 U/L (ref 38–126)
Anion gap: 12 (ref 5–15)
BUN: 12 mg/dL (ref 6–20)
CO2: 27 mmol/L (ref 22–32)
Calcium: 9.6 mg/dL (ref 8.9–10.3)
Chloride: 103 mmol/L (ref 98–111)
Creatinine, Ser: 0.83 mg/dL (ref 0.44–1.00)
GFR calc Af Amer: >60 mL/min (ref >60)
GFR calc non Af Amer: >60 mL/min (ref >60)
Glucose, Bld: 93 mg/dL (ref 70–99)
Potassium: 3.6 mmol/L (ref 3.5–5.1)
Sodium: 142 mmol/L (ref 135–145)
Total Bilirubin: <0.2 mg/dL — ABNORMAL LOW (ref 0.3–1.2)
Total Protein: 7.7 g/dL (ref 6.5–8.1)

## 2019-09-18 MED ORDER — SODIUM CHLORIDE 0.9% FLUSH
10.0000 mL | INTRAVENOUS | Status: DC | PRN
  Administered 2019-09-18: 10 mL
  Filled 2019-09-18: qty 10

## 2019-09-18 MED ORDER — DEXAMETHASONE SODIUM PHOSPHATE 10 MG/ML IJ SOLN
10.0000 mg | Freq: Once | INTRAMUSCULAR | Status: AC
  Administered 2019-09-18: 10 mg via INTRAVENOUS

## 2019-09-18 MED ORDER — SODIUM CHLORIDE 0.9 % IV SOLN
Freq: Once | INTRAVENOUS | Status: AC
  Administered 2019-09-18: 14:00:00 via INTRAVENOUS
  Filled 2019-09-18: qty 250

## 2019-09-18 MED ORDER — HEPARIN SOD (PORK) LOCK FLUSH 100 UNIT/ML IV SOLN
500.0000 [IU] | Freq: Once | INTRAVENOUS | Status: AC | PRN
  Administered 2019-09-18: 500 [IU]
  Filled 2019-09-18: qty 5

## 2019-09-18 MED ORDER — DEXAMETHASONE SODIUM PHOSPHATE 10 MG/ML IJ SOLN
INTRAMUSCULAR | Status: AC
  Filled 2019-09-18: qty 1

## 2019-09-18 MED ORDER — SODIUM CHLORIDE 0.9 % IV SOLN
2000.0000 mg | Freq: Once | INTRAVENOUS | Status: AC
  Administered 2019-09-18: 2000 mg via INTRAVENOUS
  Filled 2019-09-18: qty 52.6

## 2019-09-18 MED ORDER — PALONOSETRON HCL INJECTION 0.25 MG/5ML
INTRAVENOUS | Status: AC
  Filled 2019-09-18: qty 5

## 2019-09-18 MED ORDER — SODIUM CHLORIDE 0.9% FLUSH
10.0000 mL | INTRAVENOUS | Status: DC | PRN
  Administered 2019-09-18: 13:00:00 10 mL
  Filled 2019-09-18: qty 10

## 2019-09-18 MED ORDER — PALONOSETRON HCL INJECTION 0.25 MG/5ML
0.2500 mg | Freq: Once | INTRAVENOUS | Status: AC
  Administered 2019-09-18: 0.25 mg via INTRAVENOUS

## 2019-09-18 MED ORDER — SODIUM CHLORIDE 0.9 % IV SOLN
300.0000 mg | Freq: Once | INTRAVENOUS | Status: AC
  Administered 2019-09-18: 300 mg via INTRAVENOUS
  Filled 2019-09-18: qty 30

## 2019-09-18 NOTE — Patient Instructions (Signed)
[facility] Discharge Instructions for Patients Receiving Chemotherapy  Today you received the following chemotherapy agents: Gemzar, Carboplatin   To help prevent nausea and vomiting after your treatment, we encourage you to take your nausea medication as directed.   If you develop nausea and vomiting that is not controlled by your nausea medication, call the clinic.   BELOW ARE SYMPTOMS THAT SHOULD BE REPORTED IMMEDIATELY:  *FEVER GREATER THAN 100.5 F  *CHILLS WITH OR WITHOUT FEVER  NAUSEA AND VOMITING THAT IS NOT CONTROLLED WITH YOUR NAUSEA MEDICATION  *UNUSUAL SHORTNESS OF BREATH  *UNUSUAL BRUISING OR BLEEDING  TENDERNESS IN MOUTH AND THROAT WITH OR WITHOUT PRESENCE OF ULCERS  *URINARY PROBLEMS  *BOWEL PROBLEMS  UNUSUAL RASH Items with * indicate a potential emergency and should be followed up as soon as possible.  Feel free to call the clinic should you have any questions or concerns. The clinic phone number is [phone].  Please show the CHEMO ALERT CARD at check-in to the Emergency Department and triage nurse.   

## 2019-09-18 NOTE — Telephone Encounter (Signed)
May proceed with treatment per provider with heme of 7.5 ( see dictation note per 09/11/2019 visit ).  If pt is asymptomatic will not transfuse at this time.

## 2019-09-19 ENCOUNTER — Encounter: Payer: Self-pay | Admitting: General Practice

## 2019-09-20 ENCOUNTER — Encounter: Payer: Self-pay | Admitting: General Practice

## 2019-09-20 NOTE — Progress Notes (Signed)
[facility] CSW Progress Notes  Application for financial assistance submitted by secure email to [employer].  Edwyna Shell, LCSW Clinical Social Worker Phone:  [phone]

## 2019-09-23 ENCOUNTER — Other Ambulatory Visit: Payer: Self-pay

## 2019-09-23 DIAGNOSIS — Z20822 Contact with and (suspected) exposure to covid-19: Secondary | ICD-10-CM

## 2019-09-25 LAB — NOVEL CORONAVIRUS, NAA: SARS-CoV-2, NAA: NOT DETECTED

## 2019-10-02 ENCOUNTER — Encounter: Payer: Self-pay | Admitting: Adult Health

## 2019-10-02 ENCOUNTER — Inpatient Hospital Stay: Payer: Medicaid Other

## 2019-10-02 ENCOUNTER — Inpatient Hospital Stay (HOSPITAL_BASED_OUTPATIENT_CLINIC_OR_DEPARTMENT_OTHER): Payer: Medicaid Other | Admitting: Adult Health

## 2019-10-02 ENCOUNTER — Encounter: Payer: Self-pay | Admitting: Oncology

## 2019-10-02 ENCOUNTER — Other Ambulatory Visit: Payer: Self-pay

## 2019-10-02 ENCOUNTER — Ambulatory Visit (HOSPITAL_COMMUNITY)
Admission: RE | Admit: 2019-10-02 | Discharge: 2019-10-02 | Disposition: A | Discharge status: Home / Self Care | Payer: Medicaid Other | Source: Ambulatory Visit | Attending: Adult Health | Admitting: Adult Health

## 2019-10-02 VITALS — BP 135/82 | HR 109 | Temp 98.3°F | Resp 18 | Ht 61.5 in | Wt 207.8 lb

## 2019-10-02 VITALS — HR 88

## 2019-10-02 DIAGNOSIS — Z171 Estrogen receptor negative status [ER-]: Secondary | ICD-10-CM

## 2019-10-02 DIAGNOSIS — C7951 Secondary malignant neoplasm of bone: Secondary | ICD-10-CM

## 2019-10-02 DIAGNOSIS — C50411 Malignant neoplasm of upper-outer quadrant of right female breast: Secondary | ICD-10-CM

## 2019-10-02 DIAGNOSIS — C50919 Malignant neoplasm of unspecified site of unspecified female breast: Secondary | ICD-10-CM

## 2019-10-02 DIAGNOSIS — M25551 Pain in right hip: Secondary | ICD-10-CM | POA: Diagnosis not present

## 2019-10-02 DIAGNOSIS — Z5111 Encounter for antineoplastic chemotherapy: Secondary | ICD-10-CM | POA: Diagnosis not present

## 2019-10-02 LAB — COMPREHENSIVE METABOLIC PANEL
ALT: 25 U/L (ref 0–44)
AST: 22 U/L (ref 15–41)
Albumin: 4.1 g/dL (ref 3.5–5.0)
Alkaline Phosphatase: 64 U/L (ref 38–126)
Anion gap: 13 (ref 5–15)
BUN: 10 mg/dL (ref 6–20)
CO2: 25 mmol/L (ref 22–32)
Calcium: 9.1 mg/dL (ref 8.9–10.3)
Chloride: 105 mmol/L (ref 98–111)
Creatinine, Ser: 0.85 mg/dL (ref 0.44–1.00)
GFR calc Af Amer: >60 mL/min (ref >60)
GFR calc non Af Amer: >60 mL/min (ref >60)
Glucose, Bld: 130 mg/dL — ABNORMAL HIGH (ref 70–99)
Potassium: 3.3 mmol/L — ABNORMAL LOW (ref 3.5–5.1)
Sodium: 143 mmol/L (ref 135–145)
Total Bilirubin: <0.2 mg/dL — ABNORMAL LOW (ref 0.3–1.2)
Total Protein: 7.5 g/dL (ref 6.5–8.1)

## 2019-10-02 LAB — CBC WITH DIFFERENTIAL/PLATELET
Abs Immature Granulocytes: 0.03 10*3/uL (ref 0.00–0.07)
Basophils Absolute: 0 10*3/uL (ref 0.0–0.1)
Basophils Relative: 0 %
Eosinophils Absolute: 0.1 10*3/uL (ref 0.0–0.5)
Eosinophils Relative: 1 %
HCT: 20.7 % — ABNORMAL LOW (ref 36.0–46.0)
Hemoglobin: 7.1 g/dL — ABNORMAL LOW (ref 12.0–15.0)
Immature Granulocytes: 0 %
Lymphocytes Relative: 31 %
Lymphs Abs: 2.2 10*3/uL (ref 0.7–4.0)
MCH: 29.8 pg (ref 26.0–34.0)
MCHC: 34.3 g/dL (ref 30.0–36.0)
MCV: 87 fL (ref 80.0–100.0)
Monocytes Absolute: 0.7 10*3/uL (ref 0.1–1.0)
Monocytes Relative: 10 %
Neutro Abs: 4.1 10*3/uL (ref 1.7–7.7)
Neutrophils Relative %: 58 %
Platelets: 73 10*3/uL — ABNORMAL LOW (ref 150–400)
RBC: 2.38 MIL/uL — ABNORMAL LOW (ref 3.87–5.11)
RDW: 21.1 % — ABNORMAL HIGH (ref 11.5–15.5)
WBC: 7.1 10*3/uL (ref 4.0–10.5)
nRBC: 0.3 % — ABNORMAL HIGH (ref 0.0–0.2)

## 2019-10-02 MED ORDER — FAMOTIDINE IN NACL 20-0.9 MG/50ML-% IV SOLN
20.0000 mg | Freq: Once | INTRAVENOUS | Status: AC
  Administered 2019-10-02: 10:00:00 20 mg via INTRAVENOUS

## 2019-10-02 MED ORDER — DIPHENHYDRAMINE HCL 50 MG/ML IJ SOLN
25.0000 mg | Freq: Once | INTRAMUSCULAR | Status: AC
  Administered 2019-10-02: 10:00:00 25 mg via INTRAVENOUS

## 2019-10-02 MED ORDER — PALONOSETRON HCL INJECTION 0.25 MG/5ML
0.2500 mg | Freq: Once | INTRAVENOUS | Status: AC
  Administered 2019-10-02: 10:00:00 0.25 mg via INTRAVENOUS

## 2019-10-02 MED ORDER — SODIUM CHLORIDE 0.9 % IV SOLN
270.0000 mg | Freq: Once | INTRAVENOUS | Status: AC
  Administered 2019-10-02: 11:00:00 270 mg via INTRAVENOUS
  Filled 2019-10-02: qty 27

## 2019-10-02 MED ORDER — SODIUM CHLORIDE 0.9 % IV SOLN
2000.0000 mg | Freq: Once | INTRAVENOUS | Status: AC
  Administered 2019-10-02: 11:00:00 2000 mg via INTRAVENOUS
  Filled 2019-10-02: qty 52.6

## 2019-10-02 MED ORDER — DEXAMETHASONE SODIUM PHOSPHATE 10 MG/ML IJ SOLN
10.0000 mg | Freq: Once | INTRAMUSCULAR | Status: AC
  Administered 2019-10-02: 10:00:00 10 mg via INTRAVENOUS

## 2019-10-02 MED ORDER — SODIUM CHLORIDE 0.9% FLUSH
10.0000 mL | INTRAVENOUS | Status: DC | PRN
  Administered 2019-10-02: 10 mL
  Filled 2019-10-02: qty 10

## 2019-10-02 MED ORDER — HEPARIN SOD (PORK) LOCK FLUSH 100 UNIT/ML IV SOLN
500.0000 [IU] | Freq: Once | INTRAVENOUS | Status: AC | PRN
  Administered 2019-10-02: 12:00:00 500 [IU]
  Filled 2019-10-02: qty 5

## 2019-10-02 MED ORDER — SODIUM CHLORIDE 0.9 % IV SOLN
Freq: Once | INTRAVENOUS | Status: AC
  Administered 2019-10-02: 09:00:00 via INTRAVENOUS
  Filled 2019-10-02: qty 250

## 2019-10-02 MED ORDER — DEXAMETHASONE SODIUM PHOSPHATE 10 MG/ML IJ SOLN
INTRAMUSCULAR | Status: AC
  Filled 2019-10-02: qty 1

## 2019-10-02 MED ORDER — PALONOSETRON HCL INJECTION 0.25 MG/5ML
INTRAVENOUS | Status: AC
  Filled 2019-10-02: qty 5

## 2019-10-02 MED ORDER — DIPHENHYDRAMINE HCL 50 MG/ML IJ SOLN
INTRAMUSCULAR | Status: AC
  Filled 2019-10-02: qty 1

## 2019-10-02 MED ORDER — FAMOTIDINE IN NACL 20-0.9 MG/50ML-% IV SOLN
INTRAVENOUS | Status: AC
  Filled 2019-10-02: qty 50

## 2019-10-02 NOTE — Progress Notes (Signed)
Ok to treat per Suzan Garibaldi NP  with labs today

## 2019-10-02 NOTE — Progress Notes (Signed)
Diphenhydramine and famotidine added to premedications prior to 16th carboplatin dose (AUC=2) per protocol.   Demetrius Charity, PharmD, [facility] Phone: [phone] 10/02/2019

## 2019-10-02 NOTE — Progress Notes (Signed)
[place]  [phone] [phone]   ID: [name] DOB: [birth year]  MR#: [id number]  URK#:[id number]  Patient Care Team: Dorena Dew, FNP as PCP - General (Family Medicine) Alphonsa Overall, MD as Consulting Physician (General Surgery) Magrinat, [name], MD as Consulting Physician (Oncology) Gery Pray, MD as Consulting Physician (Radiation Oncology) Delice Bison, Charlestine Massed, NP as Nurse Practitioner (Hematology and Oncology) Alda Berthold, DO as Consulting Physician (Neurology) OTHER MD:  CHIEF COMPLAINT: triple negative stage IV breast cancer  CURRENT TREATMENT: carboplatin/ gemcitabine; zoledronate   INTERVAL HISTORY: Rebecca [name] returns today for follow-up and treatment of her recurrent triple negative breast cancer.    She continues on carboplatin and gemcitabine.   She receives treatment on days 1 and 8 of each 21-day cycle.  She is due for cycle 5 day 1 of treatment today.    She also continues on zolendronate.  Her most recent dose was 07/17/2019 and she tolerates this well.    REVIEW OF SYSTEMS: Rebecca [name] is having new right hip pain, this has been worsening over the past 4-5 days, and worse with movement.  She is taking Tramadol as needed for her hip pain which helps, but she tries to avoid taking pain medications because she doesn't like taking pills.    Rebecca [name] is anemic.  She has some mild headaches with this. She denies any dizziness, numbness, feeling faint, chest pain, DOE, or any other associated symptoms.  She has no easy bruising or bleeding.    She is without fever or chills.  She has no cough, or shortness of breath.  A detailed ROS was otherwise non contributory.    BREAST CANCER HISTORY: From the original intake note:  Rebecca [name] herself noted a change in her right breast sometime around September or October 2016. She did not bring it tto intermediate medical attention, but on 01/13/2016 she established herself in Dr. Smith Robert'  service and she was set up for bilateral diagnostic mammography with tomosynthesis and bilateral ultrasonography at the [facility] 01/19/2016. The breast density was category B. In the upper outer quadrant of the right breast there was a spiculated mass measuring 2.8 cm. On physical exam this was palpable. Targeted ultrasonography confirmed an irregular hypoechoic mass in the right breast 11:30 o'clock position measuring 2.6 cm maximally. Ultrasound of the right axilla showed a morphologically abnormal lymph node.  In the left breast there were some tubular densities behind the areola which by ultrasonography showed benign ductal ectasia.  On 01/28/2016 Rebecca [name] underwent biopsy of the right breast mass and abnormal right axillary lymph node. The pathology from this procedure (S [id number]) showed the lymph node to be benign. In the breast however there was an invasive ductal carcinoma, grade 3, which was estrogen and progesterone receptor negative. The proliferation marker was 70%. HER-2 was not amplified with a signals ratio of 1.32. The number per cell was 2.05.  The patient's subsequent history is as detailed below   PAST MEDICAL HISTORY: Past Medical History:  Diagnosis Date   Anxiety    Breast cancer ([place])    Depression    GERD (gastroesophageal reflux disease)    History of radiation therapy 11/15/16-01/12/17   right chest wall and axilla treated to 45 Gy in 25 fractions, boosted and additional 14 Gy in 8 fractions   Hypertension    diet controlled   Obesity (BMI 35.0-39.9 without comorbidity)    Pneumonia    as a child   Seasonal allergies  Sickle cell trait ([place])    Termination of pregnancy (fetus) 04/02/16    PAST SURGICAL HISTORY: Past Surgical History:  Procedure Laterality Date   CESAREAN SECTION     2004 and 2007   MASTECTOMY W/ SENTINEL NODE BIOPSY Right 09/06/2016   Procedure: RIGHT BREAST MASTECTOMY WITH RIGHT AXILLARY SENTINEL LYMPH NODE BIOPSY;   Surgeon: Alphonsa Overall, MD;  Location: [facility];  Service: General;  Laterality: Right;   PORT-A-CATH REMOVAL Left 09/06/2016   Procedure: REMOVAL PORT-A-CATH;  Surgeon: Alphonsa Overall, MD;  Location: [facility];  Service: General;  Laterality: Left;   PORTACATH PLACEMENT     PORTACATH PLACEMENT N/A 07/09/2019   Procedure: INSERTION PORT-A-CATH WITH ULTRASOUND;  Surgeon: Alphonsa Overall, MD;  Location: [facility];  Service: General;  Laterality: N/A;    FAMILY HISTORY Family History  Problem Relation Age of Onset   Hypertension Mother    Cancer Mother        dx "intestinal cancer" in her 75s; +surgery   Other Mother        hysterectomy at young age for unspecified cause   Heart Problems Mother    Breast cancer Cousin        maternal 1st cousin dx female breast cancer at 35-46y   Cancer Father    Hypertension Father    Heart Problems Maternal Aunt    Diabetes Maternal Aunt    Breast cancer Maternal Uncle        dx 64-65   Heart Problems Maternal Uncle    Breast cancer Maternal Grandmother 50   Throat cancer Maternal Grandfather        d. 65s; smoker   Sickle cell anemia Paternal Aunt    Congestive Heart Failure Maternal Aunt    Multiple sclerosis Cousin    Cancer Other        maternal great uncle (MGM's brother); cancer removed from his side   Heart attack Paternal Aunt        d. early 66s  The patient has very little information about her father. Her mother is currently 64 years old. She had a history of cervical cancer at age 25. The patient had 2 brothers, no sisters. The maternal grandfather had throat cancer. A maternal uncle was diagnosed with breast cancer as well as prostate cancer at the age of 22. 2 maternal cousins, one of the mail, had breast cancer as well.   GYNECOLOGIC HISTORY:  No LMP recorded. Menarche age 17, first live birth age 30. The patient is GX P4. She was still having regular periods at the time of diagnosis. She  took oral contraceptives in the 1990s with no side effects.--.  Stopped with chemotherapy and have not resumed as of May 2019   SOCIAL HISTORY:  (Updated 10/2018) She works as a Market researcher. The patient's significant other [name] works at [employer].  At home with the patient are her 3 children [name], [place] and [place]. There are age 68, 11, and 50 as of November 2019.  2 of them are disabled or have significant health problems, one with sickle cell disease, the other with autism and developmental delay.  The patient's son [name], currently 62 years old, lives in [place].    ADVANCED DIRECTIVES: Not in place   HEALTH MAINTENANCE: Social History   Socioeconomic History   Marital status: Single    Spouse name: Not on file   Number of children: 4  Years of education: Not on file   Highest education level: Not on file  Occupational History   Occupation: Private Care Attendant    Comment: [employer] resource strain: Not on file   Food insecurity    Worry: Never true    Inability: Never true   Transportation needs    Medical: No    Non-medical: No  Tobacco Use   Smoking status: Former Smoker    Packs/day: 1.00    Years: 20.00    Pack years: 20.00    Types: Cigarettes    Quit date: 04/01/2018    Years since quitting: 1.5   Smokeless tobacco: Never Used   Tobacco comment: Patient has quit smoking x 1 year now  Substance and Sexual Activity   Alcohol use: Yes    Comment: occ   Drug use: No   Sexual activity: Not on file  Lifestyle   Physical activity    Days per week: 7 days    Minutes per session: 60 min   Stress: Not at all  Relationships   Social connections    Talks on phone: Not on file    Gets together: Not on file    Attends religious service: Not on file    Active member of club or organization: Not on file    Attends meetings of clubs or  organizations: Not on file    Relationship status: Not on file  Other Topics Concern   Not on file  Social History Narrative   Not on file     Colonoscopy:  PAP:  Bone density:  Lipid panel:  No Known Allergies  Current Outpatient Medications on File Prior to Visit  Medication Sig Dispense Refill   calcium carbonate (TUMS - DOSED IN MG ELEMENTAL CALCIUM) 500 MG chewable tablet Chew 1 tablet by mouth daily as needed for indigestion or heartburn.     dexamethasone (DECADRON) 4 MG tablet Take 2 tablets (8 mg total) by mouth daily. Start the day after chemotherapy for 2 days. Take with food. 30 tablet 1   lidocaine-prilocaine (EMLA) cream Apply to affected area once 30 g 3   LORazepam (ATIVAN) 0.5 MG tablet Take 1 tablet (0.5 mg total) by mouth daily as needed for anxiety. 30 tablet 0   metroNIDAZOLE (FLAGYL) 500 MG tablet Take 1 tablet (500 mg total) by mouth 2 (two) times daily. Crush one tablet and apply to affected site two times a day with dressing changes 60 tablet 1   omeprazole (PRILOSEC) 20 MG capsule Take 1 capsule (20 mg total) by mouth daily. 30 capsule 1   prochlorperazine (COMPAZINE) 10 MG tablet Take 1 tablet (10 mg total) by mouth every 6 (six) hours as needed (Nausea or vomiting). 30 tablet 1   traMADol (ULTRAM) 50 MG tablet Take 1 tablet (50 mg total) by mouth every 6 (six) hours as needed. 30 tablet 0   No current facility-administered medications on file prior to visit.     OBJECTIVE:   Vitals:   10/02/19 0829  BP: 135/82  Pulse: (!) 109  Resp: 18  Temp: 98.3 F (36.8 C)  SpO2: 100%   Wt Readings from Last 3 Encounters:  10/02/19 207 lb 12.8 oz (94.3 kg)  09/18/19 207 lb 4 oz (94 kg)  09/11/19 210 lb (95.3 kg)   Body mass index is 38.63 kg/m.    ECOG FS:1 - Symptomatic but completely ambulatory GENERAL: Patient is a well appearing  female in no acute distress HEENT:  Sclerae anicteric.  Oropharynx clear and moist. No ulcerations or evidence  of oropharyngeal candidiasis. Neck is supple.  NODES:  No cervical, supraclavicular, or axillary lymphadenopathy palpated. BREAST EXAM: stable LUNGS:  Clear to auscultation bilaterally.  No wheezes or rhonchi. HEART:  Regular rate and rhythm. No murmur appreciated. ABDOMEN:  Soft, nontender.  Positive, normoactive bowel sounds. No organomegaly palpated. MSK:  No focal spinal tenderness to palpation. Full range of motion bilaterally in the upper extremities. EXTREMITIES:  No peripheral edema.   SKIN:  Clear with no obvious rashes or skin changes. No nail dyscrasia. NEURO:  Nonfocal. Well oriented.  Appropriate affect.   Right chest wall on 09/04/2019   Right chest wall on 09/11/2019     LAB RESULTS: CMP Latest Ref Rng & Units 09/18/2019 09/11/2019 08/28/2019  Glucose 70 - 99 mg/dL 93 125(H) 139(H)  BUN 6 - 20 mg/dL 12 5(L) 7  Creatinine 0.44 - 1.00 mg/dL 0.83 0.78 0.83  Sodium 135 - 145 mmol/L 142 142 143  Potassium 3.5 - 5.1 mmol/L 3.6 3.1(L) 3.4(L)  Chloride 98 - 111 mmol/L 103 108 103  CO2 22 - 32 mmol/L _0 Calcium 8.9 - 10.3 mg/dL 9.6 8.4(L) 9.7  Total Protein 6.5 - 8.1 g/dL 7.7 7.3 7.4  Total Bilirubin 0.3 - 1.2 mg/dL <0.2(L) <0.2(L) <0.2(L)  Alkaline Phos 38 - 126 U/L 70 65 68  AST 15 - 41 U/L _1 ALT 0 - 44 U/L _2 CBC    Component Value Date/Time   WBC 7.1 10/02/2019 0806   RBC 2.38 (L) 10/02/2019 0806   HGB 7.1 (L) 10/02/2019 0806   HGB 11.1 (L) 06/04/2019 1218   HGB 10.4 (L) 10/04/2016 1154   HCT 20.7 (L) 10/02/2019 0806   HCT 31.3 (L) 10/04/2016 1154   PLT 73 (L) 10/02/2019 0806   PLT 243 06/04/2019 1218   PLT 320 10/04/2016 1154   MCV 87.0 10/02/2019 0806   MCV 95.7 10/04/2016 1154   MCH 29.8 10/02/2019 0806   MCHC 34.3 10/02/2019 0806   RDW 21.1 (H) 10/02/2019 0806   RDW 16.3 (H) 10/04/2016 1154   LYMPHSABS 2.2 10/02/2019 0806   LYMPHSABS 1.7 10/04/2016 1154   MONOABS 0.7 10/02/2019 0806   MONOABS 0.5 10/04/2016 1154   EOSABS  0.1 10/02/2019 0806   EOSABS 0.3 10/04/2016 1154   BASOSABS 0.0 10/02/2019 0806   BASOSABS 0.0 10/04/2016 1154    STUDIES: Ct Chest W Contrast  Result Date: 09/05/2019 CLINICAL DATA:  Recurrent breast cancer, status post mastectomy EXAM: CT CHEST, ABDOMEN, AND PELVIS WITH CONTRAST TECHNIQUE: Multidetector CT imaging of the chest, abdomen and pelvis was performed following the standard protocol during bolus administration of intravenous contrast. CONTRAST:  162m OMNIPAQUE IOHEXOL 300 MG/ML SOLN, additional oral enteric contrast COMPARISON:  PET-CT, 06/19/2019, CT chest, 06/11/2019 FINDINGS: CT CHEST FINDINGS Cardiovascular: Left chest port catheter. Normal heart size. No pericardial effusion. Mediastinum/Nodes: No change in a right axillary lymph node or soft tissue nodule measuring 1.2 x 0.7 cm (series 2, image 21). Interval decrease in size of an enlarged left axillary lymph node measuring 2.5 x 1.2 cm, previously 3.0 x 1.6 cm when measured similarly (series 2, image 15). Interval decrease in size of a right internal mammary lymph node, measuring approximately 0.6 x 0.6 cm, previously 1.5 x 1.1 cm (series 2, image 15). Thyroid gland, trachea, and esophagus demonstrate no significant findings. Lungs/Pleura:  Lungs are clear. No pleural effusion or pneumothorax. Musculoskeletal: Status post right mastectomy. Redemonstrated spiculated appearing soft tissue mass of the right pectoralis major muscle margin, difficult to accurately measure although approximately 3.0 x 2.5 cm, unchanged compared to prior examination (series 2, image 13). CT ABDOMEN PELVIS FINDINGS Hepatobiliary: No solid liver abnormality is seen. Hepatic steatosis. No gallstones, gallbladder wall thickening, or biliary dilatation. Pancreas: Unremarkable. No pancreatic ductal dilatation or surrounding inflammatory changes. Spleen: Normal in size without significant abnormality. Adrenals/Urinary Tract: Adrenal glands are unremarkable. Kidneys are  normal, without renal calculi, solid lesion, or hydronephrosis. Bladder is unremarkable. Stomach/Bowel: Stomach is within normal limits. Appendix appears normal. No evidence of bowel wall thickening, distention, or inflammatory changes. Vascular/Lymphatic: No significant vascular findings are present. No enlarged abdominal or pelvic lymph nodes. Reproductive: No mass or other abnormality. Other: No abdominal wall hernia or abnormality. No abdominopelvic ascites. Musculoskeletal: No acute or significant osseous findings. IMPRESSION: 1. Redemonstrated spiculated appearing soft tissue mass of the right pectoralis major muscle margin status post right mastectomy, difficult to accurately measure although approximately 3.0 x 2.5 cm, unchanged compared to prior examination (series 2, image 13). 2. No change in a right axillary lymph node or soft tissue nodule measuring 1.2 x 0.7 cm (series 2, image 21). 3. Interval decrease in size of an enlarged left axillary lymph node measuring 2.5 x 1.2 cm, previously 3.0 x 1.6 cm when measured similarly (series 2, image 15). Interval decrease in size of a right internal mammary lymph node, measuring approximately 0.6 x 0.6 cm, previously 1.5 x 1.1 cm (series 2, image 15). Overall findings are consistent with a mixed response to treatment. PET-CT may be helpful to assess for response of primary mass to treatment. 4.  No evidence of the metastatic disease in the abdomen or pelvis. Electronically Signed   By: Eddie Candle M.D.   On: 09/05/2019 15:24   Ct Abdomen Pelvis W Contrast  Result Date: 09/05/2019 CLINICAL DATA:  Recurrent breast cancer, status post mastectomy EXAM: CT CHEST, ABDOMEN, AND PELVIS WITH CONTRAST TECHNIQUE: Multidetector CT imaging of the chest, abdomen and pelvis was performed following the standard protocol during bolus administration of intravenous contrast. CONTRAST:  121m OMNIPAQUE IOHEXOL 300 MG/ML SOLN, additional oral enteric contrast COMPARISON:  PET-CT,  06/19/2019, CT chest, 06/11/2019 FINDINGS: CT CHEST FINDINGS Cardiovascular: Left chest port catheter. Normal heart size. No pericardial effusion. Mediastinum/Nodes: No change in a right axillary lymph node or soft tissue nodule measuring 1.2 x 0.7 cm (series 2, image 21). Interval decrease in size of an enlarged left axillary lymph node measuring 2.5 x 1.2 cm, previously 3.0 x 1.6 cm when measured similarly (series 2, image 15). Interval decrease in size of a right internal mammary lymph node, measuring approximately 0.6 x 0.6 cm, previously 1.5 x 1.1 cm (series 2, image 15). Thyroid gland, trachea, and esophagus demonstrate no significant findings. Lungs/Pleura: Lungs are clear. No pleural effusion or pneumothorax. Musculoskeletal: Status post right mastectomy. Redemonstrated spiculated appearing soft tissue mass of the right pectoralis major muscle margin, difficult to accurately measure although approximately 3.0 x 2.5 cm, unchanged compared to prior examination (series 2, image 13). CT ABDOMEN PELVIS FINDINGS Hepatobiliary: No solid liver abnormality is seen. Hepatic steatosis. No gallstones, gallbladder wall thickening, or biliary dilatation. Pancreas: Unremarkable. No pancreatic ductal dilatation or surrounding inflammatory changes. Spleen: Normal in size without significant abnormality. Adrenals/Urinary Tract: Adrenal glands are unremarkable. Kidneys are normal, without renal calculi, solid lesion, or hydronephrosis. Bladder is unremarkable. Stomach/Bowel: Stomach is  within normal limits. Appendix appears normal. No evidence of bowel wall thickening, distention, or inflammatory changes. Vascular/Lymphatic: No significant vascular findings are present. No enlarged abdominal or pelvic lymph nodes. Reproductive: No mass or other abnormality. Other: No abdominal wall hernia or abnormality. No abdominopelvic ascites. Musculoskeletal: No acute or significant osseous findings. IMPRESSION: 1. Redemonstrated  spiculated appearing soft tissue mass of the right pectoralis major muscle margin status post right mastectomy, difficult to accurately measure although approximately 3.0 x 2.5 cm, unchanged compared to prior examination (series 2, image 13). 2. No change in a right axillary lymph node or soft tissue nodule measuring 1.2 x 0.7 cm (series 2, image 21). 3. Interval decrease in size of an enlarged left axillary lymph node measuring 2.5 x 1.2 cm, previously 3.0 x 1.6 cm when measured similarly (series 2, image 15). Interval decrease in size of a right internal mammary lymph node, measuring approximately 0.6 x 0.6 cm, previously 1.5 x 1.1 cm (series 2, image 15). Overall findings are consistent with a mixed response to treatment. PET-CT may be helpful to assess for response of primary mass to treatment. 4.  No evidence of the metastatic disease in the abdomen or pelvis. Electronically Signed   By: Eddie Candle M.D.   On: 09/05/2019 15:24    RESEARCH: Referred to PREVENT study, but was pregnant at the time; referred to [facility] 11202, but the biopsied lymph node was benign; referred to weight loss study but declined; referred to [facility] 12/09/2000, but the timing of radiation and 8 was greater than 60 days past the date of diagnosis; referred to health disparity study, but declined; referred to [id number] adjuvant therapy study, but the patient received Xeloda with radiation and therefore was ineligible   ASSESSMENT: 43 y.o.  woman status post right breast upper-outer quadrant biopsy 01/28/2016 for a clinical T2 N0 invasive ductal carcinoma, grade 3, triple negative, with an MIB-1 of 70%.  (a) suspicious right axillary lymph node biopsied 01/28/2016 was benign  (1) neoadjuvant chemotherapy: doxorubicin and cyclophosphamide in dose dense fashion 4 started 04/14/16, completed 05/26/2016, followed by paclitaxel and carboplatin weekly 12, Started 06/09/2016  (a) taxol discontinued after 7 doses because  of neuropathy, last dose 07/21/2016  (2) genetics testing October 20, 2016 through the 32-gene Comprehensive Cancer Panel offered by [facility] Junius Roads, MD) (with MSH2 Exons 1-7 Inversion Analysis) found no deleterious mutations or VUSS  In APC, ATM, AXIN2, BARD1, BMPR1A, BRCA1, BRCA2, BRIP1, CDH1, CDK4, CDKN2A, CHEK2, EPCAM, FANCC, MLH1, MSH2, MSH6, MUTYH, NBN, PALB2, PMS2, POLD1, POLE, PTEN, RAD51C, RAD51D, SCG5/GREM1, SMAD4, STK11, TP53, VHL, and XRCC2.    (3) right mastectomy and sentinel lymph node sampling 09/06/2016 showed a residual ypT1c ypN0, invasive ductal carcinoma, grade 3, with negative margins. Repeat prognostic panel again triple negative   (4) adjuvant radiation with capecitabine/Xeloda sensitization 11/15/16 - 01/12/17 Site/dose:   Right Chest Wall and axilla (4 field) treated to 45 Gy in 25 fractions, and then Boosted an additional 14.4 Gy in 8 fractions.  (5) tobacco abuse disorder: The patient quit smoking 04/04/2018  METASTATIC DISEASE:  July 2020 (6) nonspecific changes noted on chest CT 06/11/2019 were clarified by PET scan 06/19/2019 showing hypermetabolic disease in the right anterior chest wall, right internal mammary nodes, right and left axillary nodes, but no metastatic disease in the neck, lungs, abdomen or pelvis.  Bone marrow uptake suggests bony metastatic disease.  (a) CARIS requested obtained from 06/28/2019 sample confirmed a triple negativity, the tumor also was negative for  the androgen receptor, was MSI stable and mismatch repair status proficient, with a low mutational burden.  BRCA 1 and 2 were negative and PD-L1 was negative.  PI K3 showed a variant of uncertain significance.  However the tumor was genomic LOH high  (b) CA-27-29 is informative: was 118.3 on 06/04/2019  (7) zoledronate started 07/17/2019--on hold currently due to dental issues, and until 6 weeks at least after dental work  (8) carboplatin/ gemcitabine days 1 and 8 Q21 day  cycle started 07/10/2019 (restaging in 09/2019 shows no progression, will continue current treatment and restage after 3 more months)   PLAN: Rebecca [name] is doing well today.  She is receiving chemotherapy today with Carboplatin and Gemcitabine.  She has no clinical evidence of progression.  She is anemic and thrombocytopenic.  I reviewed her lab work with Dr. [name] and we will dose reduce the Carbplatin by 10%.   Hikari and I reviewed the fact that she may benefit from a blood transfusion due to her anemia, and some of her symptoms.  She wants to wait and do this until Monday.  I requested a transfusion appointment for Monday, but she knows to call us if she needs anything prior to then.    We will see Rebecca [name] back on Monday for blood transfusion.  She understands her plan.  She will be restaged after she receives three more cycles of treatment.  She was recommended to continue with the appropriate pandemic precautions. She knows to call for any questions that may arise between now and her next appointment.  We are happy to see her sooner if needed.   A total of (30) minutes of face-to-face time was spent with this patient with greater than 50% of that time in counseling and care-coordination.  Wilber Bihari, NP  10/02/19 8:46 AM Medical Oncology and Hematology [facility] [address] [place], [place] [address] Tel. [phone]    Fax. [phone]

## 2019-10-02 NOTE — Patient Instructions (Signed)
Anemia  Anemia is a condition in which you do not have enough red blood cells or hemoglobin. Hemoglobin is a substance in red blood cells that carries oxygen. When you do not have enough red blood cells or hemoglobin (are anemic), your body cannot get enough oxygen and your organs may not work properly. As a result, you may feel very tired or have other problems. What are the causes? Common causes of anemia include:  Excessive bleeding. Anemia can be caused by excessive bleeding inside or outside the body, including bleeding from the intestine or from periods in women.  Poor nutrition.  Long-lasting (chronic) kidney, thyroid, and liver disease.  Bone marrow disorders.  Cancer and treatments for cancer.  HIV (human immunodeficiency virus) and AIDS (acquired immunodeficiency syndrome).  Treatments for HIV and AIDS.  Spleen problems.  Blood disorders.  Infections, medicines, and autoimmune disorders that destroy red blood cells. What are the signs or symptoms? Symptoms of this condition include:  Minor weakness.  Dizziness.  Headache.  Feeling heartbeats that are irregular or faster than normal (palpitations).  Shortness of breath, especially with exercise.  Paleness.  Cold sensitivity.  Indigestion.  Nausea.  Difficulty sleeping.  Difficulty concentrating. Symptoms may occur suddenly or develop slowly. If your anemia is mild, you may not have symptoms. How is this diagnosed? This condition is diagnosed based on:  Blood tests.  Your medical history.  A physical exam.  Bone marrow biopsy. Your health care provider may also check your stool (feces) for blood and may do additional testing to look for the cause of your bleeding. You may also have other tests, including:  Imaging tests, such as a CT scan or MRI.  Endoscopy.  Colonoscopy. How is this treated? Treatment for this condition depends on the cause. If you continue to lose a lot of blood, you may  need to be treated at a hospital. Treatment may include:  Taking supplements of iron, vitamin S31, or folic acid.  Taking a hormone medicine (erythropoietin) that can help to stimulate red blood cell growth.  Having a blood transfusion. This may be needed if you lose a lot of blood.  Making changes to your diet.  Having surgery to remove your spleen. Follow these instructions at home:  Take over-the-counter and prescription medicines only as told by your health care provider.  Take supplements only as told by your health care provider.  Follow any diet instructions that you were given.  Keep all follow-up visits as told by your health care provider. This is important. Contact a health care provider if:  You develop new bleeding anywhere in the body. Get help right away if:  You are very weak.  You are short of breath.  You have pain in your abdomen or chest.  You are dizzy or feel faint.  You have trouble concentrating.  You have bloody or black, tarry stools.  You vomit repeatedly or you vomit up blood. Summary  Anemia is a condition in which you do not have enough red blood cells or enough of a substance in your red blood cells that carries oxygen (hemoglobin).  Symptoms may occur suddenly or develop slowly.  If your anemia is mild, you may not have symptoms.  This condition is diagnosed with blood tests as well as a medical history and physical exam. Other tests may be needed.  Treatment for this condition depends on the cause of the anemia. This information is not intended to replace advice given to you by  your health care provider. Make sure you discuss any questions you have with your health care provider. Document Released: 12/29/2004 Document Revised: 11/03/2017 Document Reviewed: 12/23/2016 Elsevier Patient Education  2020 [employer].

## 2019-10-02 NOTE — Patient Instructions (Signed)
[facility] Discharge Instructions for Patients Receiving Chemotherapy  Today you received the following chemotherapy agents:  Gemcitibine, Carboplatin  To help prevent nausea and vomiting after your treatment, we encourage you to take your nausea medication as prescribed.   If you develop nausea and vomiting that is not controlled by your nausea medication, call the clinic.   BELOW ARE SYMPTOMS THAT SHOULD BE REPORTED IMMEDIATELY:  *FEVER GREATER THAN 100.5 F  *CHILLS WITH OR WITHOUT FEVER  NAUSEA AND VOMITING THAT IS NOT CONTROLLED WITH YOUR NAUSEA MEDICATION  *UNUSUAL SHORTNESS OF BREATH  *UNUSUAL BRUISING OR BLEEDING  TENDERNESS IN MOUTH AND THROAT WITH OR WITHOUT PRESENCE OF ULCERS  *URINARY PROBLEMS  *BOWEL PROBLEMS  UNUSUAL RASH Items with * indicate a potential emergency and should be followed up as soon as possible.  Feel free to call the clinic should you have any questions or concerns. The clinic phone number is [phone].  Please show the CHEMO ALERT CARD at check-in to the Emergency Department and triage nurse.   

## 2019-10-03 LAB — CANCER ANTIGEN 27.29: CA 27.29: 156.9 U/mL — ABNORMAL HIGH (ref 0.0–38.6)

## 2019-10-04 ENCOUNTER — Telehealth: Payer: Self-pay

## 2019-10-04 NOTE — Telephone Encounter (Signed)
Attempted to contact pt to review results of xray and inquire about pain. LVM at [phone] for pt to return call. Called [phone] and was told by person who answered that I had the wrong number.

## 2019-10-05 ENCOUNTER — Emergency Department (HOSPITAL_COMMUNITY)
Admission: EM | Admit: 2019-10-05 | Discharge: 2019-10-05 | Discharge status: Against Medical Advice | Payer: Medicaid Other | Attending: Emergency Medicine | Admitting: Emergency Medicine

## 2019-10-05 ENCOUNTER — Other Ambulatory Visit: Payer: Self-pay

## 2019-10-05 DIAGNOSIS — R110 Nausea: Secondary | ICD-10-CM | POA: Diagnosis not present

## 2019-10-05 DIAGNOSIS — R519 Headache, unspecified: Secondary | ICD-10-CM | POA: Diagnosis present

## 2019-10-05 DIAGNOSIS — D649 Anemia, unspecified: Secondary | ICD-10-CM | POA: Diagnosis not present

## 2019-10-05 DIAGNOSIS — R112 Nausea with vomiting, unspecified: Secondary | ICD-10-CM | POA: Diagnosis not present

## 2019-10-05 MED ORDER — SODIUM CHLORIDE 0.9% IV SOLUTION: Freq: Once | INTRAVENOUS | Status: DC

## 2019-10-05 NOTE — ED Notes (Signed)
Pt walked out of ED. Attempted to get her to wait so I could notify PA she was leaving and she would not wait. PA and RN notified.

## 2019-10-05 NOTE — ED Provider Notes (Signed)
[facility] DEPT Provider Note   CSN: [id number] Arrival date & time: 10/05/19  1134     History   Chief Complaint Chief Complaint  Patient presents with  . Headache  . Nausea    HPI Rebecca [name] is a 43 y.o. female with past medical history of stage IV breast cancer with bony mets, presenting to the emergency department for blood transfusion.  Patient is currently undergoing chemotherapy treatment with carboplatin/gemcitabine, as well as zoledronate.  Her last treatment was on 10/02/2019.  Patient states her treatment has been causing her to be anemic and she has been having intermittent symptoms including headaches with some lightheadedness.  She was scheduled for this Monday for an outpatient blood transfusion and was instructed to contact her oncologist sooner if symptoms worsen.  She states she has been having some worsening of her headaches and woke up today with significant headache with associated nausea and vomiting.  She was instructed to report for blood transfusion, however the clinic is closed and therefore she is here in the ED for transfusion.  Her headache and n/v resolved without intervention.  Not on anticoagulation.  No other bleeding reported. Recent hgb on 10/02/19 was 7.1, and slowly downtrending over the last few weeks.  Hematocrit 20.7.     The history is provided by the patient.    Past Medical History:  Diagnosis Date  . Anxiety   . Breast cancer ([place])   . Depression   . GERD (gastroesophageal reflux disease)   . History of radiation therapy 11/15/16-01/12/17   right chest wall and axilla treated to 45 Gy in 25 fractions, boosted and additional 14 Gy in 8 fractions  . Hypertension    diet controlled  . Obesity (BMI 35.0-39.9 without comorbidity)   . Pneumonia    as a child  . Seasonal allergies   . Sickle cell trait ([place])   . Termination of pregnancy (fetus) 04/02/16    Patient Active Problem List   Diagnosis Date Noted  . Neuropathy due to chemotherapeutic drug ([place]) 07/10/2019  . Recurrent breast adenocarcinoma ([place]) 07/02/2019  . Bone metastases ([place]) 07/02/2019  . Goals of care, counseling/discussion 07/02/2019  . Morbid obesity with body mass index (BMI) of 40.0 to 44.9 in adult ([place]) 04/10/2018  . Genetic testing 11/20/2016  . Family history of breast cancer 11/20/2016  . Malignant neoplasm of upper-outer quadrant of right breast in female, estrogen receptor negative ([place]) 02/04/2016  . Hirsutism 01/13/2016  . Tobacco dependence 01/13/2016  . Irregular periods/menstrual cycles 01/13/2016    Past Surgical History:  Procedure Laterality Date  . CESAREAN SECTION     2004 and 2007  . MASTECTOMY W/ SENTINEL NODE BIOPSY Right 09/06/2016   Procedure: RIGHT BREAST MASTECTOMY WITH RIGHT AXILLARY SENTINEL LYMPH NODE BIOPSY;  Surgeon: Alphonsa Overall, MD;  Location: [facility];  Service: General;  Laterality: Right;  . PORT-A-CATH REMOVAL Left 09/06/2016   Procedure: REMOVAL PORT-A-CATH;  Surgeon: Alphonsa Overall, MD;  Location: [facility];  Service: General;  Laterality: Left;  . PORTACATH PLACEMENT    . PORTACATH PLACEMENT N/A 07/09/2019   Procedure: INSERTION PORT-A-CATH WITH ULTRASOUND;  Surgeon: Alphonsa Overall, MD;  Location: [facility];  Service: General;  Laterality: N/A;     OB History   No obstetric history on file.      Home Medications    Prior to Admission medications   Medication Sig Start Date End Date Taking? Authorizing Provider  calcium carbonate (  TUMS - DOSED IN MG ELEMENTAL CALCIUM) 500 MG chewable tablet Chew 1 tablet by mouth daily as needed for indigestion or heartburn.    [provider]  dexamethasone (DECADRON) 4 MG tablet Take 2 tablets (8 mg total) by mouth daily. Start the day after chemotherapy for 2 days. Take with food. 07/04/19   Magrinat, [name], MD  lidocaine-prilocaine (EMLA) cream Apply to affected area once 07/02/19    Magrinat, [name], MD  LORazepam (ATIVAN) 0.5 MG tablet Take 1 tablet (0.5 mg total) by mouth daily as needed for anxiety. 08/21/19   Gardenia Phlegm, NP  metroNIDAZOLE (FLAGYL) 500 MG tablet Take 1 tablet (500 mg total) by mouth 2 (two) times daily. Crush one tablet and apply to affected site two times a day with dressing changes 09/05/19   Magrinat, [name], MD  omeprazole (PRILOSEC) 20 MG capsule Take 1 capsule (20 mg total) by mouth daily. 07/17/19   Magrinat, [name], MD  prochlorperazine (COMPAZINE) 10 MG tablet Take 1 tablet (10 mg total) by mouth every 6 (six) hours as needed (Nausea or vomiting). 07/02/19   Magrinat, [name], MD  traMADol (ULTRAM) 50 MG tablet Take 1 tablet (50 mg total) by mouth every 6 (six) hours as needed. 08/21/19   Gardenia Phlegm, NP    Family History Family History  Problem Relation Age of Onset  . Hypertension Mother   . Cancer Mother        dx "intestinal cancer" in her 79s; +surgery  . Other Mother        hysterectomy at young age for unspecified cause  . Heart Problems Mother   . Breast cancer Cousin        maternal 1st cousin dx female breast cancer at 28-46y  . Cancer Father   . Hypertension Father   . Heart Problems Maternal Aunt   . Diabetes Maternal Aunt   . Breast cancer Maternal Uncle        dx 64-65  . Heart Problems Maternal Uncle   . Breast cancer Maternal Grandmother 57  . Throat cancer Maternal Grandfather        d. 46s; smoker  . Sickle cell anemia Paternal Aunt   . Congestive Heart Failure Maternal Aunt   . Multiple sclerosis Cousin   . Cancer Other        maternal great uncle (MGM's brother); cancer removed from his side  . Heart attack Paternal Aunt        d. early 89s    Social History Social History   Tobacco Use  . Smoking status: Former Smoker    Packs/day: 1.00    Years: 20.00    Pack years: 20.00    Types: Cigarettes    Quit date: 04/01/2018    Years since quitting: 1.5  . Smokeless tobacco:  Never Used  . Tobacco comment: Patient has quit smoking x 1 year now  Substance Use Topics  . Alcohol use: Yes    Comment: occ  . Drug use: No     Allergies   Patient has no known allergies.   Review of Systems Review of Systems  Eyes: Negative for photophobia and visual disturbance.  Gastrointestinal: Positive for nausea and vomiting.  Allergic/Immunologic: Positive for immunocompromised state.  Neurological: Positive for headaches.  All other systems reviewed and are negative.    Physical Exam Updated Vital Signs BP 112/72 (BP Location: Left Arm)   Pulse (!) 110   Temp 98.6 F (37  C) (Oral)   Resp 18   Ht 5' 1.5" (1.562 m)   Wt 94.3 kg   SpO2 100%   BMI 38.63 kg/m   Physical Exam Vitals signs and nursing note reviewed.  Constitutional:      General: She is not in acute distress.    Appearance: She is well-developed.  HENT:     Head: Normocephalic and atraumatic.  Eyes:     Extraocular Movements: Extraocular movements intact.     Conjunctiva/sclera: Conjunctivae normal.     Pupils: Pupils are equal, round, and reactive to light.  Cardiovascular:     Rate and Rhythm: Normal rate and regular rhythm.     Comments: Left port in place Pulmonary:     Effort: Pulmonary effort is normal. No respiratory distress.     Breath sounds: Normal breath sounds.     Comments: S/p right mastectomy Abdominal:     Palpations: Abdomen is soft.  Skin:    General: Skin is warm.  Neurological:     Mental Status: She is alert.     Comments: Cn grossly normal. Normal strength and coordination  Psychiatric:        Behavior: Behavior normal.      ED Treatments / Results  Labs (all labs ordered are listed, but only abnormal results are displayed) Labs Reviewed  CBC WITH DIFFERENTIAL/PLATELET  COMPREHENSIVE METABOLIC PANEL  TYPE AND SCREEN    EKG None  Radiology No results found.  Procedures Procedures (including critical care time)  Medications Ordered in ED  Medications - No data to display   Initial Impression / Assessment and Plan / ED Course  I have reviewed the triage vital signs and the nursing notes.  Pertinent labs & imaging results that were available during my care of the patient were reviewed by me and considered in my medical decision making (see chart for details).  Clinical Course as of Oct 04 1600  Sat Oct 05, 2019  1553 Per NT, pt reportedly eloped due to delay in lab draw. Unfortunately I was unable to discuss with pt before leaving the department.   [JR]    Clinical Course User Index [JR] Raegan Sipp, [place] N, PA-C       Pt presenting for transfusion due to symptomatic anemia attributed to current chemotherapy treatment. She was initially scheduled for outpt transfusion on Monday, however symptoms worsened and presents to the ED today for treatment. She is complaining of intermittent headache that worsened this morning, however resolved without intervention. Unfortunately, there was a delay in accessing patients port and she no longer wanted to wait, per NT, and was witnessed eloping from the ED.   Final Clinical Impressions(s) / ED Diagnoses   Final diagnoses:  Symptomatic anemia    ED Discharge Orders    None       Donta Fuster, [place] N, PA-C 10/05/19 1601    Hayden Rasmussen, MD 10/06/19 [phone]

## 2019-10-05 NOTE — ED Notes (Signed)
Pt getting irritated about length of time she has been here. Advised pt that I could straight stick her so we could get the blood tests running while we waited for her port to be accessed. Pt initially agreed to let me straight stick her and then told me to wait so she could call her care team at the cancer center because she wanted to just leave. Will check back in on pt momentarily.  PA notified.

## 2019-10-05 NOTE — ED Triage Notes (Signed)
Patient reports she has stage 4 breast cancer and currently being treated at cancer ctr. Patient reports headache starting a week or two ago. She is scheduled for transfusion Monday, but cancer ctr told her to come to ER to be evaluated for headache as it has worsened it the past 3 days. Patient reports nausea and small vomiting episode with bile like substance. Headache was 10/10 pain wise, but patient states head is not hurting any longer.

## 2019-10-05 NOTE — ED Notes (Signed)
Pt seen walking out of triage area towards lobby. Pt advised this  RN she was leaving and was not going to wait to be seen.

## 2019-10-07 ENCOUNTER — Inpatient Hospital Stay: Payer: Medicaid Other

## 2019-10-07 ENCOUNTER — Other Ambulatory Visit: Payer: Self-pay

## 2019-10-07 ENCOUNTER — Telehealth: Payer: Self-pay | Admitting: *Deleted

## 2019-10-07 ENCOUNTER — Ambulatory Visit: Payer: Medicaid Other

## 2019-10-07 ENCOUNTER — Inpatient Hospital Stay: Payer: Medicaid Other | Attending: Oncology

## 2019-10-07 DIAGNOSIS — C50411 Malignant neoplasm of upper-outer quadrant of right female breast: Secondary | ICD-10-CM | POA: Diagnosis not present

## 2019-10-07 DIAGNOSIS — Z87891 Personal history of nicotine dependence: Secondary | ICD-10-CM | POA: Insufficient documentation

## 2019-10-07 DIAGNOSIS — D573 Sickle-cell trait: Secondary | ICD-10-CM | POA: Diagnosis not present

## 2019-10-07 DIAGNOSIS — C7951 Secondary malignant neoplasm of bone: Secondary | ICD-10-CM | POA: Diagnosis not present

## 2019-10-07 DIAGNOSIS — T451X5A Adverse effect of antineoplastic and immunosuppressive drugs, initial encounter: Secondary | ICD-10-CM | POA: Insufficient documentation

## 2019-10-07 DIAGNOSIS — Z923 Personal history of irradiation: Secondary | ICD-10-CM | POA: Diagnosis not present

## 2019-10-07 DIAGNOSIS — E669 Obesity, unspecified: Secondary | ICD-10-CM | POA: Insufficient documentation

## 2019-10-07 DIAGNOSIS — K219 Gastro-esophageal reflux disease without esophagitis: Secondary | ICD-10-CM | POA: Diagnosis not present

## 2019-10-07 DIAGNOSIS — D6481 Anemia due to antineoplastic chemotherapy: Secondary | ICD-10-CM | POA: Insufficient documentation

## 2019-10-07 DIAGNOSIS — Z5111 Encounter for antineoplastic chemotherapy: Secondary | ICD-10-CM | POA: Diagnosis not present

## 2019-10-07 DIAGNOSIS — Z803 Family history of malignant neoplasm of breast: Secondary | ICD-10-CM | POA: Insufficient documentation

## 2019-10-07 DIAGNOSIS — Z171 Estrogen receptor negative status [ER-]: Secondary | ICD-10-CM | POA: Insufficient documentation

## 2019-10-07 DIAGNOSIS — Z79899 Other long term (current) drug therapy: Secondary | ICD-10-CM | POA: Insufficient documentation

## 2019-10-07 DIAGNOSIS — I10 Essential (primary) hypertension: Secondary | ICD-10-CM | POA: Insufficient documentation

## 2019-10-07 DIAGNOSIS — Z9011 Acquired absence of right breast and nipple: Secondary | ICD-10-CM | POA: Diagnosis not present

## 2019-10-07 DIAGNOSIS — C778 Secondary and unspecified malignant neoplasm of lymph nodes of multiple regions: Secondary | ICD-10-CM | POA: Diagnosis not present

## 2019-10-07 DIAGNOSIS — F418 Other specified anxiety disorders: Secondary | ICD-10-CM | POA: Insufficient documentation

## 2019-10-07 LAB — COMPREHENSIVE METABOLIC PANEL
ALT: 20 U/L (ref 0–44)
AST: 21 U/L (ref 15–41)
Albumin: 4 g/dL (ref 3.5–5.0)
Alkaline Phosphatase: 60 U/L (ref 38–126)
Anion gap: 10 (ref 5–15)
BUN: 10 mg/dL (ref 6–20)
CO2: 27 mmol/L (ref 22–32)
Calcium: 9 mg/dL (ref 8.9–10.3)
Chloride: 104 mmol/L (ref 98–111)
Creatinine, Ser: 0.8 mg/dL (ref 0.44–1.00)
GFR calc Af Amer: >60 mL/min (ref >60)
GFR calc non Af Amer: >60 mL/min (ref >60)
Glucose, Bld: 116 mg/dL — ABNORMAL HIGH (ref 70–99)
Potassium: 3.2 mmol/L — ABNORMAL LOW (ref 3.5–5.1)
Sodium: 141 mmol/L (ref 135–145)
Total Bilirubin: 0.5 mg/dL (ref 0.3–1.2)
Total Protein: 7.3 g/dL (ref 6.5–8.1)

## 2019-10-07 LAB — CBC WITH DIFFERENTIAL/PLATELET
Abs Immature Granulocytes: 0 10*3/uL (ref 0.00–0.07)
Basophils Absolute: 0 10*3/uL (ref 0.0–0.1)
Basophils Relative: 2 %
Eosinophils Absolute: 0.1 10*3/uL (ref 0.0–0.5)
Eosinophils Relative: 3 %
HCT: 18.1 % — ABNORMAL LOW (ref 36.0–46.0)
Hemoglobin: 6.3 g/dL — CL (ref 12.0–15.0)
Lymphocytes Relative: 50 %
Lymphs Abs: 1 10*3/uL (ref 0.7–4.0)
MCH: 30.9 pg (ref 26.0–34.0)
MCHC: 34.8 g/dL (ref 30.0–36.0)
MCV: 88.7 fL (ref 80.0–100.0)
Monocytes Absolute: 0.1 10*3/uL (ref 0.1–1.0)
Monocytes Relative: 5 %
Neutro Abs: 0.8 10*3/uL — ABNORMAL LOW (ref 1.7–7.7)
Neutrophils Relative %: 40 %
Platelets: 110 10*3/uL — ABNORMAL LOW (ref 150–400)
RBC: 2.04 MIL/uL — ABNORMAL LOW (ref 3.87–5.11)
RDW: 21.8 % — ABNORMAL HIGH (ref 11.5–15.5)
WBC: 2 10*3/uL — ABNORMAL LOW (ref 4.0–10.5)
nRBC: 0 % (ref 0.0–0.2)

## 2019-10-07 LAB — PREPARE RBC (CROSSMATCH)

## 2019-10-07 LAB — SAMPLE TO BLOOD BANK

## 2019-10-07 LAB — ABO/RH: ABO/RH(D): O POS

## 2019-10-07 MED ORDER — HEPARIN SOD (PORK) LOCK FLUSH 100 UNIT/ML IV SOLN
250.0000 [IU] | INTRAVENOUS | Status: AC | PRN
  Administered 2019-10-07: 500 [IU]
  Filled 2019-10-07: qty 5

## 2019-10-07 MED ORDER — DIPHENHYDRAMINE HCL 25 MG PO CAPS
25.0000 mg | ORAL_CAPSULE | Freq: Once | ORAL | Status: AC
  Administered 2019-10-07: 25 mg via ORAL

## 2019-10-07 MED ORDER — DIPHENHYDRAMINE HCL 25 MG PO CAPS
ORAL_CAPSULE | ORAL | Status: AC
  Filled 2019-10-07: qty 2

## 2019-10-07 MED ORDER — ACETAMINOPHEN 325 MG PO TABS
ORAL_TABLET | ORAL | Status: AC
  Filled 2019-10-07: qty 2

## 2019-10-07 MED ORDER — SODIUM CHLORIDE 0.9% FLUSH
10.0000 mL | INTRAVENOUS | Status: AC | PRN
  Administered 2019-10-07: 10 mL
  Filled 2019-10-07: qty 10

## 2019-10-07 MED ORDER — SODIUM CHLORIDE 0.9% FLUSH
3.0000 mL | INTRAVENOUS | Status: DC | PRN
  Filled 2019-10-07: qty 10

## 2019-10-07 MED ORDER — ACETAMINOPHEN 325 MG PO TABS
650.0000 mg | ORAL_TABLET | Freq: Once | ORAL | Status: AC
  Administered 2019-10-07: 650 mg via ORAL

## 2019-10-07 MED ORDER — SODIUM CHLORIDE 0.9% IV SOLUTION
250.0000 mL | Freq: Once | INTRAVENOUS | Status: AC
  Administered 2019-10-07: 250 mL via INTRAVENOUS
  Filled 2019-10-07: qty 250

## 2019-10-07 NOTE — Telephone Encounter (Signed)
CRITICAL VALUE STICKER  CRITICAL VALUE: Hgb = 6.3.  RECEIVER (on-site recipient of call): Mining engineer, Triage [place].   DATE & TIME NOTIFIED: 10/07/2019 at 0942.   MESSENGER (representative from lab): Kushani MT [employer].  MD NOTIFIED: Collaborative nurse.  TIME OF NOTIFICATION: 10/07/2019 at 0954.  RESPONSE: None.

## 2019-10-07 NOTE — Patient Instructions (Signed)
Anemia  Anemia is a condition in which you do not have enough red blood cells or hemoglobin. Hemoglobin is a substance in red blood cells that carries oxygen. When you do not have enough red blood cells or hemoglobin (are anemic), your body cannot get enough oxygen and your organs may not work properly. As a result, you may feel very tired or have other problems. What are the causes? Common causes of anemia include:  Excessive bleeding. Anemia can be caused by excessive bleeding inside or outside the body, including bleeding from the intestine or from periods in women.  Poor nutrition.  Long-lasting (chronic) kidney, thyroid, and liver disease.  Bone marrow disorders.  Cancer and treatments for cancer.  HIV (human immunodeficiency virus) and AIDS (acquired immunodeficiency syndrome).  Treatments for HIV and AIDS.  Spleen problems.  Blood disorders.  Infections, medicines, and autoimmune disorders that destroy red blood cells. What are the signs or symptoms? Symptoms of this condition include:  Minor weakness.  Dizziness.  Headache.  Feeling heartbeats that are irregular or faster than normal (palpitations).  Shortness of breath, especially with exercise.  Paleness.  Cold sensitivity.  Indigestion.  Nausea.  Difficulty sleeping.  Difficulty concentrating. Symptoms may occur suddenly or develop slowly. If your anemia is mild, you may not have symptoms. How is this diagnosed? This condition is diagnosed based on:  Blood tests.  Your medical history.  A physical exam.  Bone marrow biopsy. Your health care provider may also check your stool (feces) for blood and may do additional testing to look for the cause of your bleeding. You may also have other tests, including:  Imaging tests, such as a CT scan or MRI.  Endoscopy.  Colonoscopy. How is this treated? Treatment for this condition depends on the cause. If you continue to lose a lot of blood, you may  need to be treated at a hospital. Treatment may include:  Taking supplements of iron, vitamin S31, or folic acid.  Taking a hormone medicine (erythropoietin) that can help to stimulate red blood cell growth.  Having a blood transfusion. This may be needed if you lose a lot of blood.  Making changes to your diet.  Having surgery to remove your spleen. Follow these instructions at home:  Take over-the-counter and prescription medicines only as told by your health care provider.  Take supplements only as told by your health care provider.  Follow any diet instructions that you were given.  Keep all follow-up visits as told by your health care provider. This is important. Contact a health care provider if:  You develop new bleeding anywhere in the body. Get help right away if:  You are very weak.  You are short of breath.  You have pain in your abdomen or chest.  You are dizzy or feel faint.  You have trouble concentrating.  You have bloody or black, tarry stools.  You vomit repeatedly or you vomit up blood. Summary  Anemia is a condition in which you do not have enough red blood cells or enough of a substance in your red blood cells that carries oxygen (hemoglobin).  Symptoms may occur suddenly or develop slowly.  If your anemia is mild, you may not have symptoms.  This condition is diagnosed with blood tests as well as a medical history and physical exam. Other tests may be needed.  Treatment for this condition depends on the cause of the anemia. This information is not intended to replace advice given to you by  your health care provider. Make sure you discuss any questions you have with your health care provider. Document Released: 12/29/2004 Document Revised: 11/03/2017 Document Reviewed: 12/23/2016 Elsevier Patient Education  2020 [place] [employer]. Blood Transfusion, Adult  A blood transfusion is a procedure in which you receive donated blood, including plasma,  platelets, and red blood cells, through an IV tube. You may need a blood transfusion because of illness, surgery, or injury. The blood may come from a donor. You may also be able to donate blood for yourself (autologous blood donation) before a surgery if you know that you might require a blood transfusion. The blood given in a transfusion is made up of different types of cells. You may receive:  Red blood cells. These carry oxygen to the cells in the body.  White blood cells. These help you fight infections.  Platelets. These help your blood to clot.  Plasma. This is the liquid part of your blood and it helps with fluid imbalances. If you have hemophilia or another clotting disorder, you may also receive other types of blood products. Tell a health care provider about:  Any allergies you have.  All medicines you are taking, including vitamins, herbs, eye drops, creams, and over-the-counter medicines.  Any problems you or family members have had with anesthetic medicines.  Any blood disorders you have.  Any surgeries you have had.  Any medical conditions you have, including any recent fever or cold symptoms.  Whether you are pregnant or may be pregnant.  Any previous reactions you have had during a blood transfusion. What are the risks? Generally, this is a safe procedure. However, problems may occur, including:  Having an allergic reaction to something in the donated blood. Hives and itching may be symptoms of this type of reaction.  Fever. This may be a reaction to the white blood cells in the transfused blood. Nausea or chest pain may accompany a fever.  Iron overload. This can happen from having many transfusions.  Transfusion-related acute lung injury (TRALI). This is a rare reaction that causes lung damage. The cause is not known.TRALI can occur within hours of a transfusion or several days later.  Sudden (acute) or delayed hemolytic reactions. This happens if your blood  does not match the cells in your transfusion. Your body's defense system (immune system) may try to attack the new cells. This complication is rare. The symptoms include fever, chills, nausea, and low back pain or chest pain.  Infection or disease transmission. This is rare. What happens before the procedure?  You will have a blood test to determine your blood type. This is necessary to know what kind of blood your body will accept and to match it to the donor blood.  If you are going to have a planned surgery, you may be able to do an autologous blood donation. This may be done in case you need to have a transfusion.  If you have had an allergic reaction to a transfusion in the past, you may be given medicine to help prevent a reaction. This medicine may be given to you by mouth or through an IV tube.  You will have your temperature, blood pressure, and pulse monitored before the transfusion.  Follow instructions from your health care provider about eating and drinking restrictions.  Ask your health care provider about: ? Changing or stopping your regular medicines. This is especially important if you are taking diabetes medicines or blood thinners. ? Taking medicines such as aspirin and ibuprofen.  These medicines can thin your blood. Do not take these medicines before your procedure if your health care provider instructs you not to. What happens during the procedure?  An IV tube will be inserted into one of your veins.  The bag of donated blood will be attached to your IV tube. The blood will then enter through your vein.  Your temperature, blood pressure, and pulse will be monitored regularly during the transfusion. This monitoring is done to detect early signs of a transfusion reaction.  If you have any signs or symptoms of a reaction, your transfusion will be stopped and you may be given medicine.  When the transfusion is complete, your IV tube will be removed.  Pressure may be  applied to the IV site for a few minutes.  A bandage (dressing) will be applied. The procedure may vary among health care providers and hospitals. What happens after the procedure?  Your temperature, blood pressure, heart rate, breathing rate, and blood oxygen level will be monitored often.  Your blood may be tested to see how you are responding to the transfusion.  You may be warmed with fluids or blankets to maintain a normal body temperature. Summary  A blood transfusion is a procedure in which you receive donated blood, including plasma, platelets, and red blood cells, through an IV tube.  Your temperature, blood pressure, and pulse will be monitored before, during, and after the transfusion.  Your blood may be tested after the transfusion to see how your body has responded. This information is not intended to replace advice given to you by your health care provider. Make sure you discuss any questions you have with your health care provider. Document Released: 11/18/2000 Document Revised: 10/08/2018 Document Reviewed: 08/18/2016 Elsevier Patient Education  2020 [employer].

## 2019-10-08 LAB — BPAM RBC
Blood Product Expiration Date: 202012022359
ISSUE DATE / TIME: 202011021138
Unit Type and Rh: 5100

## 2019-10-08 LAB — TYPE AND SCREEN
ABO/RH(D): O POS
Antibody Screen: NEGATIVE
Unit division: 0

## 2019-10-09 ENCOUNTER — Other Ambulatory Visit: Payer: Self-pay

## 2019-10-09 ENCOUNTER — Inpatient Hospital Stay (HOSPITAL_BASED_OUTPATIENT_CLINIC_OR_DEPARTMENT_OTHER): Payer: Medicaid Other | Admitting: Adult Health

## 2019-10-09 ENCOUNTER — Inpatient Hospital Stay: Payer: Medicaid Other

## 2019-10-09 ENCOUNTER — Encounter: Payer: Self-pay | Admitting: Adult Health

## 2019-10-09 VITALS — BP 141/63 | HR 110 | Temp 98.2°F | Resp 18 | Ht 61.5 in | Wt 207.7 lb

## 2019-10-09 VITALS — HR 98

## 2019-10-09 DIAGNOSIS — C7951 Secondary malignant neoplasm of bone: Secondary | ICD-10-CM | POA: Diagnosis not present

## 2019-10-09 DIAGNOSIS — C50411 Malignant neoplasm of upper-outer quadrant of right female breast: Secondary | ICD-10-CM

## 2019-10-09 DIAGNOSIS — Z171 Estrogen receptor negative status [ER-]: Secondary | ICD-10-CM

## 2019-10-09 DIAGNOSIS — Z5111 Encounter for antineoplastic chemotherapy: Secondary | ICD-10-CM | POA: Diagnosis not present

## 2019-10-09 DIAGNOSIS — C50919 Malignant neoplasm of unspecified site of unspecified female breast: Secondary | ICD-10-CM

## 2019-10-09 LAB — SAMPLE TO BLOOD BANK

## 2019-10-09 LAB — CBC WITH DIFFERENTIAL/PLATELET
Abs Immature Granulocytes: 0.01 10*3/uL (ref 0.00–0.07)
Basophils Absolute: 0 10*3/uL (ref 0.0–0.1)
Basophils Relative: 0 %
Eosinophils Absolute: 0 10*3/uL (ref 0.0–0.5)
Eosinophils Relative: 0 %
HCT: 22.8 % — ABNORMAL LOW (ref 36.0–46.0)
Hemoglobin: 7.8 g/dL — ABNORMAL LOW (ref 12.0–15.0)
Immature Granulocytes: 0 %
Lymphocytes Relative: 41 %
Lymphs Abs: 2 10*3/uL (ref 0.7–4.0)
MCH: 30.7 pg (ref 26.0–34.0)
MCHC: 34.2 g/dL (ref 30.0–36.0)
MCV: 89.8 fL (ref 80.0–100.0)
Monocytes Absolute: 0.4 10*3/uL (ref 0.1–1.0)
Monocytes Relative: 7 %
Neutro Abs: 2.5 10*3/uL (ref 1.7–7.7)
Neutrophils Relative %: 52 %
Platelets: 91 10*3/uL — ABNORMAL LOW (ref 150–400)
RBC: 2.54 MIL/uL — ABNORMAL LOW (ref 3.87–5.11)
RDW: 21 % — ABNORMAL HIGH (ref 11.5–15.5)
WBC: 4.9 10*3/uL (ref 4.0–10.5)
nRBC: 0 % (ref 0.0–0.2)

## 2019-10-09 LAB — COMPREHENSIVE METABOLIC PANEL
ALT: 18 U/L (ref 0–44)
AST: 20 U/L (ref 15–41)
Albumin: 4.4 g/dL (ref 3.5–5.0)
Alkaline Phosphatase: 64 U/L (ref 38–126)
Anion gap: 11 (ref 5–15)
BUN: 9 mg/dL (ref 6–20)
CO2: 24 mmol/L (ref 22–32)
Calcium: 8.8 mg/dL — ABNORMAL LOW (ref 8.9–10.3)
Chloride: 107 mmol/L (ref 98–111)
Creatinine, Ser: 0.78 mg/dL (ref 0.44–1.00)
GFR calc Af Amer: >60 mL/min (ref >60)
GFR calc non Af Amer: >60 mL/min (ref >60)
Glucose, Bld: 89 mg/dL (ref 70–99)
Potassium: 3.8 mmol/L (ref 3.5–5.1)
Sodium: 142 mmol/L (ref 135–145)
Total Bilirubin: 0.3 mg/dL (ref 0.3–1.2)
Total Protein: 8.1 g/dL (ref 6.5–8.1)

## 2019-10-09 MED ORDER — HEPARIN SOD (PORK) LOCK FLUSH 100 UNIT/ML IV SOLN
500.0000 [IU] | Freq: Once | INTRAVENOUS | Status: AC | PRN
  Administered 2019-10-09: 500 [IU]
  Filled 2019-10-09: qty 5

## 2019-10-09 MED ORDER — SODIUM CHLORIDE 0.9 % IV SOLN
270.0000 mg | Freq: Once | INTRAVENOUS | Status: AC
  Administered 2019-10-09: 270 mg via INTRAVENOUS
  Filled 2019-10-09: qty 27

## 2019-10-09 MED ORDER — DIPHENHYDRAMINE HCL 50 MG/ML IJ SOLN
INTRAMUSCULAR | Status: AC
  Filled 2019-10-09: qty 1

## 2019-10-09 MED ORDER — FAMOTIDINE IN NACL 20-0.9 MG/50ML-% IV SOLN
INTRAVENOUS | Status: AC
  Filled 2019-10-09: qty 50

## 2019-10-09 MED ORDER — PALONOSETRON HCL INJECTION 0.25 MG/5ML
0.2500 mg | Freq: Once | INTRAVENOUS | Status: AC
  Administered 2019-10-09: 0.25 mg via INTRAVENOUS

## 2019-10-09 MED ORDER — FAMOTIDINE IN NACL 20-0.9 MG/50ML-% IV SOLN
20.0000 mg | Freq: Once | INTRAVENOUS | Status: AC
  Administered 2019-10-09: 10:00:00 20 mg via INTRAVENOUS

## 2019-10-09 MED ORDER — SODIUM CHLORIDE 0.9% FLUSH
10.0000 mL | INTRAVENOUS | Status: DC | PRN
  Administered 2019-10-09: 10 mL
  Filled 2019-10-09: qty 10

## 2019-10-09 MED ORDER — SODIUM CHLORIDE 0.9% FLUSH
10.0000 mL | INTRAVENOUS | Status: DC | PRN
  Administered 2019-10-09: 09:00:00 10 mL
  Filled 2019-10-09: qty 10

## 2019-10-09 MED ORDER — DIPHENHYDRAMINE HCL 50 MG/ML IJ SOLN
25.0000 mg | Freq: Once | INTRAMUSCULAR | Status: AC
  Administered 2019-10-09: 10:00:00 25 mg via INTRAVENOUS

## 2019-10-09 MED ORDER — DEXAMETHASONE SODIUM PHOSPHATE 10 MG/ML IJ SOLN
10.0000 mg | Freq: Once | INTRAMUSCULAR | Status: AC
  Administered 2019-10-09: 10:00:00 10 mg via INTRAVENOUS

## 2019-10-09 MED ORDER — SODIUM CHLORIDE 0.9 % IV SOLN
2000.0000 mg | Freq: Once | INTRAVENOUS | Status: AC
  Administered 2019-10-09: 2000 mg via INTRAVENOUS
  Filled 2019-10-09: qty 52.6

## 2019-10-09 MED ORDER — SODIUM CHLORIDE 0.9 % IV SOLN
Freq: Once | INTRAVENOUS | Status: AC
  Administered 2019-10-09: 10:00:00 via INTRAVENOUS
  Filled 2019-10-09: qty 250

## 2019-10-09 MED ORDER — DEXAMETHASONE SODIUM PHOSPHATE 10 MG/ML IJ SOLN
INTRAMUSCULAR | Status: AC
  Filled 2019-10-09: qty 1

## 2019-10-09 MED ORDER — PALONOSETRON HCL INJECTION 0.25 MG/5ML
INTRAVENOUS | Status: AC
  Filled 2019-10-09: qty 5

## 2019-10-09 NOTE — Patient Instructions (Signed)
[facility] Discharge Instructions for Patients Receiving Chemotherapy  Today you received the following chemotherapy agents:  Gemcitibine, Carboplatin  To help prevent nausea and vomiting after your treatment, we encourage you to take your nausea medication as prescribed.   If you develop nausea and vomiting that is not controlled by your nausea medication, call the clinic.   BELOW ARE SYMPTOMS THAT SHOULD BE REPORTED IMMEDIATELY:  *FEVER GREATER THAN 100.5 F  *CHILLS WITH OR WITHOUT FEVER  NAUSEA AND VOMITING THAT IS NOT CONTROLLED WITH YOUR NAUSEA MEDICATION  *UNUSUAL SHORTNESS OF BREATH  *UNUSUAL BRUISING OR BLEEDING  TENDERNESS IN MOUTH AND THROAT WITH OR WITHOUT PRESENCE OF ULCERS  *URINARY PROBLEMS  *BOWEL PROBLEMS  UNUSUAL RASH Items with * indicate a potential emergency and should be followed up as soon as possible.  Feel free to call the clinic should you have any questions or concerns. The clinic phone number is [phone].  Please show the CHEMO ALERT CARD at check-in to the Emergency Department and triage nurse.   

## 2019-10-09 NOTE — Progress Notes (Signed)
[place]  Telephone:(336) [phone] Fax:(336) [phone]   ID: Rebecca [name] DOB: 23-Mar-1976  MR#: [id number]  ERX#:[id number]  Patient Care Team: Dorena Dew, FNP as PCP - General (Family Medicine) Alphonsa Overall, MD as Consulting Physician (General Surgery) Magrinat, [name], MD as Consulting Physician (Oncology) Gery Pray, MD as Consulting Physician (Radiation Oncology) Delice Bison, Charlestine Massed, NP as Nurse Practitioner (Hematology and Oncology) Alda Berthold, DO as Consulting Physician (Neurology) OTHER MD:  CHIEF COMPLAINT: triple negative stage IV breast cancer  CURRENT TREATMENT: carboplatin/ gemcitabine; zoledronate   INTERVAL HISTORY: Rebecca [name] returns today for follow-up and treatment of her recurrent triple negative breast cancer.    She continues on carboplatin and gemcitabine.   She receives treatment on days 1 and 8 of each 21-day cycle.  She is due for cycle 5 day 8 of treatment today.    She also continues on zolendronate.  Her most recent dose was 07/17/2019 and she tolerates this well.    REVIEW OF SYSTEMS: Rebecca [name] has been having new right hip pain, that she told me about last week.  We did x-rays that were negative.  She says that she rubs icy hot on it.  She thinks it is getting better.    Rebecca [name] received a blood transfusion on Monday (two days ago).  Her hemoglobin had dropped to 6.3.  The transfusion went well and her headaches have since resolved.  She says that she is feeling improved.  Her hemoglobin has increased to 7.8 today.    She is without fever or chills.  She has no cough, or shortness of breath. She has no nausea, vomiting, bowel or bladder changes.  She has no new pain.  A detailed ROS was otherwise non contributory.    BREAST CANCER HISTORY: From the original intake note:  Rebecca [name] herself noted a change in her right breast sometime around September or October 2016. She did not bring it tto intermediate medical attention, but on  01/13/2016 she established herself in Dr. Smith Robert' service and she was set up for bilateral diagnostic mammography with tomosynthesis and bilateral ultrasonography at the [facility] 01/19/2016. The breast density was category B. In the upper outer quadrant of the right breast there was a spiculated mass measuring 2.8 cm. On physical exam this was palpable. Targeted ultrasonography confirmed an irregular hypoechoic mass in the right breast 11:30 o'clock position measuring 2.6 cm maximally. Ultrasound of the right axilla showed a morphologically abnormal lymph node.  In the left breast there were some tubular densities behind the areola which by ultrasonography showed benign ductal ectasia.  On 01/28/2016 Rebecca [name] underwent biopsy of the right breast mass and abnormal right axillary lymph node. The pathology from this procedure ([facility] [id number]) showed the lymph node to be benign. In the breast however there was an invasive ductal carcinoma, grade 3, which was estrogen and progesterone receptor negative. The proliferation marker was 70%. HER-2 was not amplified with a signals ratio of 1.32. The number per cell was 2.05.  The patient's subsequent history is as detailed below   PAST MEDICAL HISTORY: Past Medical History:  Diagnosis Date   Anxiety    Breast cancer ([place])    Depression    GERD (gastroesophageal reflux disease)    History of radiation therapy 11/15/16-01/12/17   right chest wall and axilla treated to 45 Gy in 25 fractions, boosted and additional 14 Gy in 8 fractions   Hypertension    diet controlled   Obesity (BMI 35.0-39.9 without  comorbidity)    Pneumonia    as a child   Seasonal allergies    Sickle cell trait ([place])    Termination of pregnancy (fetus) 04/02/16    PAST SURGICAL HISTORY: Past Surgical History:  Procedure Laterality Date   CESAREAN SECTION     2004 and 2007   MASTECTOMY W/ SENTINEL NODE BIOPSY Right 09/06/2016   Procedure: RIGHT BREAST MASTECTOMY WITH  RIGHT AXILLARY SENTINEL LYMPH NODE BIOPSY;  Surgeon: Alphonsa Overall, MD;  Location: [facility];  Service: General;  Laterality: Right;   PORT-A-CATH REMOVAL Left 09/06/2016   Procedure: REMOVAL PORT-A-CATH;  Surgeon: Alphonsa Overall, MD;  Location: [facility];  Service: General;  Laterality: Left;   PORTACATH PLACEMENT     PORTACATH PLACEMENT N/A 07/09/2019   Procedure: INSERTION PORT-A-CATH WITH ULTRASOUND;  Surgeon: Alphonsa Overall, MD;  Location: [facility];  Service: General;  Laterality: N/A;    FAMILY HISTORY Family History  Problem Relation Age of Onset   Hypertension Mother    Cancer Mother        dx "intestinal cancer" in her 62s; +surgery   Other Mother        hysterectomy at young age for unspecified cause   Heart Problems Mother    Breast cancer Cousin        maternal 1st cousin dx female breast cancer at 63-46y   Cancer Father    Hypertension Father    Heart Problems Maternal Aunt    Diabetes Maternal Aunt    Breast cancer Maternal Uncle        dx 64-65   Heart Problems Maternal Uncle    Breast cancer Maternal Grandmother 50   Throat cancer Maternal Grandfather        d. 14s; smoker   Sickle cell anemia Paternal Aunt    Congestive Heart Failure Maternal Aunt    Multiple sclerosis Cousin    Cancer Other        maternal great uncle (MGM's brother); cancer removed from his side   Heart attack Paternal Aunt        d. early 23s  The patient has very little information about her father. Her mother is currently 11 years old. She had a history of cervical cancer at age 69. The patient had 2 brothers, no sisters. The maternal grandfather had throat cancer. A maternal uncle was diagnosed with breast cancer as well as prostate cancer at the age of 11. 2 maternal cousins, one of the mail, had breast cancer as well.   GYNECOLOGIC HISTORY:  No LMP recorded. Menarche age 36, first live birth age 45. The patient is GX P4. She was still having  regular periods at the time of diagnosis. She took oral contraceptives in the 1990s with no side effects.--.  Stopped with chemotherapy and have not resumed as of May 2019   SOCIAL HISTORY:  (Updated 10/2018) She works as a Market researcher. The patient's significant other [name] works at [employer].  At home with the patient are her 3 children [name], [place] and [place]. There are age 22, 43, and 59 as of November 2019.  2 of them are disabled or have significant health problems, one with sickle cell disease, the other with autism and developmental delay.  The patient's son [name], currently 18 years old, lives in [place].    ADVANCED DIRECTIVES: Not in place   HEALTH MAINTENANCE: Social History   Socioeconomic History  Marital status: Single    Spouse name: Not on file   Number of children: 4   Years of education: Not on file   Highest education level: Not on file  Occupational History   Occupation: Private Care Attendant    Comment: [employer] [place] resource strain: Not on file   Food insecurity    Worry: Never true    Inability: Never true   Transportation needs    Medical: No    Non-medical: No  Tobacco Use   Smoking status: Former Smoker    Packs/day: 1.00    Years: 20.00    Pack years: 20.00    Types: Cigarettes    Quit date: 04/01/2018    Years since quitting: 1.5   Smokeless tobacco: Never Used   Tobacco comment: Patient has quit smoking x 1 year now  Substance and Sexual Activity   Alcohol use: Yes    Comment: occ   Drug use: No   Sexual activity: Not on file  Lifestyle   Physical activity    Days per week: 7 days    Minutes per session: 60 min   Stress: Not at all  Relationships   Social connections    Talks on phone: Not on file    Gets together: Not on file    Attends religious service: Not on file    Active member of club or organization: Not on  file    Attends meetings of clubs or organizations: Not on file    Relationship status: Not on file  Other Topics Concern   Not on file  Social History Narrative   Not on file     Colonoscopy:  PAP:  Bone density:  Lipid panel:  No Known Allergies  Current Outpatient Medications on File Prior to Visit  Medication Sig Dispense Refill   calcium carbonate (TUMS - DOSED IN MG ELEMENTAL CALCIUM) 500 MG chewable tablet Chew 1 tablet by mouth daily as needed for indigestion or heartburn.     dexamethasone (DECADRON) 4 MG tablet Take 2 tablets (8 mg total) by mouth daily. Start the day after chemotherapy for 2 days. Take with food. 30 tablet 1   lidocaine-prilocaine (EMLA) cream Apply to affected area once 30 g 3   LORazepam (ATIVAN) 0.5 MG tablet Take 1 tablet (0.5 mg total) by mouth daily as needed for anxiety. 30 tablet 0   metroNIDAZOLE (FLAGYL) 500 MG tablet Take 1 tablet (500 mg total) by mouth 2 (two) times daily. Crush one tablet and apply to affected site two times a day with dressing changes 60 tablet 1   omeprazole (PRILOSEC) 20 MG capsule Take 1 capsule (20 mg total) by mouth daily. 30 capsule 1   prochlorperazine (COMPAZINE) 10 MG tablet Take 1 tablet (10 mg total) by mouth every 6 (six) hours as needed (Nausea or vomiting). 30 tablet 1   traMADol (ULTRAM) 50 MG tablet Take 1 tablet (50 mg total) by mouth every 6 (six) hours as needed. 30 tablet 0   No current facility-administered medications on file prior to visit.     OBJECTIVE:   Vitals:   10/09/19 0857  BP: (!) 141/63  Pulse: (!) 110  Resp: 18  Temp: 98.2 F (36.8 C)  SpO2: 100%   Wt Readings from Last 3 Encounters:  10/09/19 207 lb 11.2 oz (94.2 kg)  10/05/19 207 lb 12.8 oz (94.3 kg)  10/02/19 207 lb 12.8 oz (94.3 kg)  Body mass index is 38.61 kg/m.    ECOG FS:1 - Symptomatic but completely ambulatory GENERAL: Patient is a well appearing female in no acute distress HEENT:  Sclerae anicteric.   Oropharynx clear and moist. No ulcerations or evidence of oropharyngeal candidiasis. Neck is supple.  NODES:  No cervical, supraclavicular, or axillary lymphadenopathy palpated. BREAST EXAM: see below LUNGS:  Clear to auscultation bilaterally.  No wheezes or rhonchi. HEART:  Regular rate and rhythm. No murmur appreciated. ABDOMEN:  Soft, nontender.  Positive, normoactive bowel sounds. No organomegaly palpated. MSK:  No focal spinal tenderness to palpation. Full range of motion bilaterally in the upper extremities. EXTREMITIES:  No peripheral edema.   SKIN:  Clear with no obvious rashes or skin changes. No nail dyscrasia. NEURO:  Nonfocal. Well oriented.  Appropriate affect. Right chest wall 10/09/2019        LAB RESULTS: CMP Latest Ref Rng & Units 10/09/2019 10/07/2019 10/02/2019  Glucose 70 - 99 mg/dL 89 116(H) 130(H)  BUN 6 - 20 mg/dL _0 Creatinine 0.44 - 1.00 mg/dL 0.78 0.80 0.85  Sodium 135 - 145 mmol/L 142 141 143  Potassium 3.5 - 5.1 mmol/L 3.8 3.2(L) 3.3(L)  Chloride 98 - 111 mmol/L 107 104 105  CO2 22 - 32 mmol/L _1 Calcium 8.9 - 10.3 mg/dL 8.8(L) 9.0 9.1  Total Protein 6.5 - 8.1 g/dL 8.1 7.3 7.5  Total Bilirubin 0.3 - 1.2 mg/dL 0.3 0.5 <0.2(L)  Alkaline Phos 38 - 126 U/L 64 60 64  AST 15 - 41 U/L _2 ALT 0 - 44 U/L _3 CBC    Component Value Date/Time   WBC 4.9 10/09/2019 0844   RBC 2.54 (L) 10/09/2019 0844   HGB 7.8 (L) 10/09/2019 0844   HGB 11.1 (L) 06/04/2019 1218   HGB 10.4 (L) 10/04/2016 1154   HCT 22.8 (L) 10/09/2019 0844   HCT 31.3 (L) 10/04/2016 1154   PLT 91 (L) 10/09/2019 0844   PLT 243 06/04/2019 1218   PLT 320 10/04/2016 1154   MCV 89.8 10/09/2019 0844   MCV 95.7 10/04/2016 1154   MCH 30.7 10/09/2019 0844   MCHC 34.2 10/09/2019 0844   RDW 21.0 (H) 10/09/2019 0844   RDW 16.3 (H) 10/04/2016 1154   LYMPHSABS 2.0 10/09/2019 0844   LYMPHSABS 1.7 10/04/2016 1154   MONOABS 0.4 10/09/2019 0844   MONOABS 0.5 10/04/2016  1154   EOSABS 0.0 10/09/2019 0844   EOSABS 0.3 10/04/2016 1154   BASOSABS 0.0 10/09/2019 0844   BASOSABS 0.0 10/04/2016 1154    STUDIES: Dg Hip Unilat With Pelvis 2-3 Views Right  Result Date: 10/02/2019 CLINICAL DATA:  RIGHT hip pain, history metastatic breast cancer question metastasis EXAM: DG HIP (WITH OR WITHOUT PELVIS) 2-3V RIGHT COMPARISON:  CT pelvis 09/05/2019 FINDINGS: Osseous mineralization normal. Hip and SI joint spaces preserved. No acute fracture, dislocation, or bone destruction. No abnormal sclerotic or lytic foci are seen to suggest osseous metastasis. IMPRESSION: No acute osseous abnormalities. If patient has persistent symptoms, consider MR. Electronically Signed   By: Lavonia Dana M.D.   On: 10/02/2019 17:56    RESEARCH: Referred to PREVENT study, but was pregnant at the time; referred to [facility] 11202, but the biopsied lymph node was benign; referred to weight loss study but declined; referred to [facility] 12/09/2000, but the timing of radiation and 8 was greater than 60 days past the date of diagnosis; referred to health  disparity study, but declined; referred to [id number] adjuvant therapy study, but the patient received Xeloda with radiation and therefore was ineligible   ASSESSMENT: 43 y.o. [place] woman status post right breast upper-outer quadrant biopsy 01/28/2016 for a clinical T2 N0 invasive ductal carcinoma, grade 3, triple negative, with an MIB-1 of 70%.  (a) suspicious right axillary lymph node biopsied 01/28/2016 was benign  (1) neoadjuvant chemotherapy: doxorubicin and cyclophosphamide in dose dense fashion 4 started 04/14/16, completed 05/26/2016, followed by paclitaxel and carboplatin weekly 12, Started 06/09/2016  (a) taxol discontinued after 7 doses because of neuropathy, last dose 07/21/2016  (2) genetics testing October 20, 2016 through the 32-gene Comprehensive Cancer Panel offered by [facility] Junius Roads, MD) (with MSH2 Exons 1-7  Inversion Analysis) found no deleterious mutations or VUSS  In APC, ATM, AXIN2, BARD1, BMPR1A, BRCA1, BRCA2, BRIP1, CDH1, CDK4, CDKN2A, CHEK2, EPCAM, FANCC, MLH1, MSH2, MSH6, MUTYH, NBN, PALB2, PMS2, POLD1, POLE, PTEN, RAD51C, RAD51D, SCG5/GREM1, SMAD4, STK11, TP53, VHL, and XRCC2.    (3) right mastectomy and sentinel lymph node sampling 09/06/2016 showed a residual ypT1c ypN0, invasive ductal carcinoma, grade 3, with negative margins. Repeat prognostic panel again triple negative   (4) adjuvant radiation with capecitabine/Xeloda sensitization 11/15/16 - 01/12/17 Site/dose:   Right Chest Wall and axilla (4 field) treated to 45 Gy in 25 fractions, and then Boosted an additional 14.4 Gy in 8 fractions.  (5) tobacco abuse disorder: The patient quit smoking 04/04/2018  METASTATIC DISEASE:  July 2020 (6) nonspecific changes noted on chest CT 06/11/2019 were clarified by PET scan 06/19/2019 showing hypermetabolic disease in the right anterior chest wall, right internal mammary nodes, right and left axillary nodes, but no metastatic disease in the neck, lungs, abdomen or pelvis.  Bone marrow uptake suggests bony metastatic disease.  (a) CARIS requested obtained from 06/28/2019 sample confirmed a triple negativity, the tumor also was negative for the androgen receptor, was MSI stable and mismatch repair status proficient, with a low mutational burden.  BRCA 1 and 2 were negative and PD-L1 was negative.  PI K3 showed a variant of uncertain significance.  However the tumor was genomic LOH high  (b) CA-27-29 is informative: was 118.3 on 06/04/2019  (7) zoledronate started 07/17/2019--on hold currently due to dental issues, and until 6 weeks at least after dental work  (8) carboplatin/ gemcitabine days 1 and 8 Q21 day cycle started 07/10/2019 (restaging in 09/2019 shows no progression, will continue current treatment and restage after 3 more months)   PLAN: Rebecca [name] is doing well today.  She continues to  tolerate treatment well.  Clinically, we are observing her right chest wall mass.  This is more apparent visually on today's visit, however she notes that she had a neosporin bandage on it and she thinks it is actually improving.  We will examine this in 2 weeks to see how it appears.    Rebecca [name] has some mild cytopenias from her metastatic cancer and her chemotherapy treatment.  Her plt count is 91 and her hemoglobin is 7.8.  We will continue to transfuse her for symptomatic anemia when her hemoglobin is around 7 or less.  Her carboplatin has been dose reduced by 10% and she will continue with this.  Rebecca [name] remains active and her hip is feeling better.  I let her know that if it worsens again, we may need to obtain an MRI.  For now we will monitor this.  Rebecca [name] is due for restaging later this month.  I have placed orders for  bone scan and CT chest/abdomen and pelvis, with f/u with Dr. [name] the first week in December.  She understands that we may need to expedite this should her chest wall mass worsen clinically.    Rebecca [name] will return on 11/18 for labs, f/u with me, and her next cycle of Gemcitabine/Carboplatin.  She was recommended to continue with the appropriate pandemic precautions. She knows to call for any questions that may arise between now and her next appointment.  We are happy to see her sooner if needed.   A total of (30) minutes of face-to-face time was spent with this patient with greater than 50% of that time in counseling and care-coordination.  Wilber Bihari, NP  10/09/19 9:47 AM Medical Oncology and Hematology [facility] [address] [place], [place] [address] Tel. [phone]    Fax. [phone]

## 2019-10-09 NOTE — Progress Notes (Signed)
Pts Carbo dose reduced by 10% on 10/02/19 d/t anemia & thrombocytopenia. Dose reduction carried forward by provider.  Kennith Center, Pharm.D., CPP 10/09/2019@12 :21 PM

## 2019-10-09 NOTE — Progress Notes (Signed)
Okay to treat today with Hgb 7.8 and plts 91, per Wilber Bihari, NP.

## 2019-10-09 NOTE — Progress Notes (Signed)
[id number]

## 2019-10-16 NOTE — Progress Notes (Signed)
[place]  [phone] [phone]   ID: [name] DOB: 1976/05/14  MR#: [id number]  XBW#:[id number]  Patient Care Team: Dorena Dew, FNP as PCP - General (Family Medicine) Alphonsa Overall, MD as Consulting Physician (General Surgery) Magrinat, [name], MD as Consulting Physician (Oncology) Gery Pray, MD as Consulting Physician (Radiation Oncology) Delice Bison, Charlestine Massed, NP as Nurse Practitioner (Hematology and Oncology) Alda Berthold, DO as Consulting Physician (Neurology) OTHER MD:  CHIEF COMPLAINT: triple negative stage IV breast cancer  CURRENT TREATMENT: carboplatin/ gemcitabine; zoledronate   INTERVAL HISTORY: Heily returns today for follow-up and treatment of he rtriple negative breast cancer.    She continues on carboplatin and gemcitabine.   She receives treatment on days 1 and 8 of each 21-day cycle.  She is due for cycle 6 day 1 of treatment today.    She also continues on zolendronate.  Her most recent dose was 07/17/2019 and she tolerates this well.  She notes that she needs to have a tooth that broke off removed, and some fragments removed.  She is unsure when to do this, but notes she needs it completed soon.  REVIEW OF SYSTEMS: Deshante is doing well today.  She notes her hip pain has improved.  She says her right chest wall mass is still present, and she isn't sure if it is better or worse.  She denies any new issues such as headaches, fever, chills, chest pain, palpitations, bowel/bladder changes, cough, or shortness of breath.  She is continuing to work and has no significant issues today.    BREAST CANCER HISTORY: From the original intake note:  Arial herself noted a change in her right breast sometime around September or October 2016. She did not bring it tto intermediate medical attention, but on 01/13/2016 she established herself in Dr. Smith Robert' service and she was set up for bilateral diagnostic mammography with  tomosynthesis and bilateral ultrasonography at the [facility] 01/19/2016. The breast density was category B. In the upper outer quadrant of the right breast there was a spiculated mass measuring 2.8 cm. On physical exam this was palpable. Targeted ultrasonography confirmed an irregular hypoechoic mass in the right breast 11:30 o'clock position measuring 2.6 cm maximally. Ultrasound of the right axilla showed a morphologically abnormal lymph node.  In the left breast there were some tubular densities behind the areola which by ultrasonography showed benign ductal ectasia.  On 01/28/2016 Mayleigh underwent biopsy of the right breast mass and abnormal right axillary lymph node. The pathology from this procedure (S AAA [id number]) showed the lymph node to be benign. In the breast however there was an invasive ductal carcinoma, grade 3, which was estrogen and progesterone receptor negative. The proliferation marker was 70%. HER-2 was not amplified with a signals ratio of 1.32. The number per cell was 2.05.  The patient's subsequent history is as detailed below   PAST MEDICAL HISTORY: Past Medical History:  Diagnosis Date   Anxiety    Breast cancer ([place])    Depression    GERD (gastroesophageal reflux disease)    History of radiation therapy 11/15/16-01/12/17   right chest wall and axilla treated to 45 Gy in 25 fractions, boosted and additional 14 Gy in 8 fractions   Hypertension    diet controlled   Obesity (BMI 35.0-39.9 without comorbidity)    Pneumonia    as a child   Seasonal allergies    Sickle cell trait ([place])    Termination of pregnancy (fetus) 04/02/16  PAST SURGICAL HISTORY: Past Surgical History:  Procedure Laterality Date   CESAREAN SECTION     2004 and 2007   MASTECTOMY W/ SENTINEL NODE BIOPSY Right 09/06/2016   Procedure: RIGHT BREAST MASTECTOMY WITH RIGHT AXILLARY SENTINEL LYMPH NODE BIOPSY;  Surgeon: Alphonsa Overall, MD;  Location: [facility];  Service:  General;  Laterality: Right;   PORT-A-CATH REMOVAL Left 09/06/2016   Procedure: REMOVAL PORT-A-CATH;  Surgeon: Alphonsa Overall, MD;  Location: [facility];  Service: General;  Laterality: Left;   PORTACATH PLACEMENT     PORTACATH PLACEMENT N/A 07/09/2019   Procedure: INSERTION PORT-A-CATH WITH ULTRASOUND;  Surgeon: Alphonsa Overall, MD;  Location: [facility];  Service: General;  Laterality: N/A;    FAMILY HISTORY Family History  Problem Relation Age of Onset   Hypertension Mother    Cancer Mother        dx "intestinal cancer" in her 63s; +surgery   Other Mother        hysterectomy at young age for unspecified cause   Heart Problems Mother    Breast cancer Cousin        maternal 1st cousin dx female breast cancer at 28-46y   Cancer Father    Hypertension Father    Heart Problems Maternal Aunt    Diabetes Maternal Aunt    Breast cancer Maternal Uncle        dx 64-65   Heart Problems Maternal Uncle    Breast cancer Maternal Grandmother 50   Throat cancer Maternal Grandfather        d. 6s; smoker   Sickle cell anemia Paternal Aunt    Congestive Heart Failure Maternal Aunt    Multiple sclerosis Cousin    Cancer Other        maternal great uncle (MGM's brother); cancer removed from his side   Heart attack Paternal Aunt        d. early 55s  The patient has very little information about her father. Her mother is currently 35 years old. She had a history of cervical cancer at age 10. The patient had 2 brothers, no sisters. The maternal grandfather had throat cancer. A maternal uncle was diagnosed with breast cancer as well as prostate cancer at the age of 40. 2 maternal cousins, one of the mail, had breast cancer as well.   GYNECOLOGIC HISTORY:  No LMP recorded. Menarche age 56, first live birth age 62. The patient is GX P4. She was still having regular periods at the time of diagnosis. She took oral contraceptives in the 1990s with no side effects.--.  Stopped with  chemotherapy and have not resumed as of May 2019   SOCIAL HISTORY:  (Updated 10/2018) She works as a Market researcher. The patient's significant other [name] works at [employer].  At home with the patient are her 3 children [name], [place] and [place]. There are age 51, 68, and 14 as of November 2019.  2 of them are disabled or have significant health problems, one with sickle cell disease, the other with autism and developmental delay.  The patient's son [name], currently 106 years old, lives in [place].    ADVANCED DIRECTIVES: Not in place   HEALTH MAINTENANCE: Social History   Socioeconomic History   Marital status: Single    Spouse name: Not on file   Number of children: 4   Years of education: Not on file   Highest education level: Not on file  Occupational History   Occupation: Private Care Attendant    Comment: [employer] choice [place] resource strain: Not on file   Food insecurity    Worry: Never true    Inability: Never true   Transportation needs    Medical: No    Non-medical: No  Tobacco Use   Smoking status: Former Smoker    Packs/day: 1.00    Years: 20.00    Pack years: 20.00    Types: Cigarettes    Quit date: 04/01/2018    Years since quitting: 1.5   Smokeless tobacco: Never Used   Tobacco comment: Patient has quit smoking x 1 year now  Substance and Sexual Activity   Alcohol use: Yes    Comment: occ   Drug use: No   Sexual activity: Not on file  Lifestyle   Physical activity    Days per week: 7 days    Minutes per session: 60 min   Stress: Not at all  Relationships   Social connections    Talks on phone: Not on file    Gets together: Not on file    Attends religious service: Not on file    Active member of club or organization: Not on file    Attends meetings of clubs or organizations: Not on file    Relationship status: Not on file  Other Topics Concern     Not on file  Social History Narrative   Not on file     Colonoscopy:  PAP:  Bone density:  Lipid panel:  No Known Allergies  Current Outpatient Medications on File Prior to Visit  Medication Sig Dispense Refill   calcium carbonate (TUMS - DOSED IN MG ELEMENTAL CALCIUM) 500 MG chewable tablet Chew 1 tablet by mouth daily as needed for indigestion or heartburn.     dexamethasone (DECADRON) 4 MG tablet Take 2 tablets (8 mg total) by mouth daily. Start the day after chemotherapy for 2 days. Take with food. 30 tablet 1   lidocaine-prilocaine (EMLA) cream Apply to affected area once 30 g 3   LORazepam (ATIVAN) 0.5 MG tablet Take 1 tablet (0.5 mg total) by mouth daily as needed for anxiety. 30 tablet 0   metroNIDAZOLE (FLAGYL) 500 MG tablet Take 1 tablet (500 mg total) by mouth 2 (two) times daily. Crush one tablet and apply to affected site two times a day with dressing changes 60 tablet 1   omeprazole (PRILOSEC) 20 MG capsule Take 1 capsule (20 mg total) by mouth daily. 30 capsule 1   prochlorperazine (COMPAZINE) 10 MG tablet Take 1 tablet (10 mg total) by mouth every 6 (six) hours as needed (Nausea or vomiting). 30 tablet 1   traMADol (ULTRAM) 50 MG tablet Take 1 tablet (50 mg total) by mouth every 6 (six) hours as needed. 30 tablet 0   No current facility-administered medications on file prior to visit.     OBJECTIVE:   Vitals:   10/23/19 0827  BP: 121/78  Pulse: (!) 106  Resp: 18  Temp: 98.7 F (37.1 C)  SpO2: 100%   Wt Readings from Last 3 Encounters:  10/23/19 210 lb (95.3 kg)  10/09/19 207 lb 11.2 oz (94.2 kg)  10/05/19 207 lb 12.8 oz (94.3 kg)   Body mass index is 39.04 kg/m.    ECOG FS:1 - Symptomatic but completely ambulatory GENERAL: Patient is a well appearing female in no acute distress HEENT:  Sclerae anicteric.  Oropharynx clear and moist.  No ulcerations or evidence of oropharyngeal candidiasis. Neck is supple.  NODES:  No cervical,  supraclavicular, or axillary lymphadenopathy palpated. BREAST EXAM: see below LUNGS:  Clear to auscultation bilaterally.  No wheezes or rhonchi. HEART:  Regular rate and rhythm. No murmur appreciated. ABDOMEN:  Soft, nontender.  Positive, normoactive bowel sounds. No organomegaly palpated. MSK:  No focal spinal tenderness to palpation. Full range of motion bilaterally in the upper extremities. EXTREMITIES:  No peripheral edema.   SKIN:  Clear with no obvious rashes or skin changes. No nail dyscrasia. NEURO:  Nonfocal. Well oriented.  Appropriate affect. Right chest wall 10/09/2019  09/11/2019 on the left; 10/23/2019 on the right      LAB RESULTS: CMP Latest Ref Rng & Units 10/09/2019 10/07/2019 10/02/2019  Glucose 70 - 99 mg/dL 89 116(H) 130(H)  BUN 6 - 20 mg/dL 9 10 10   Creatinine 0.44 - 1.00 mg/dL 0.78 0.80 0.85  Sodium 135 - 145 mmol/L 142 141 143  Potassium 3.5 - 5.1 mmol/L 3.8 3.2(L) 3.3(L)  Chloride 98 - 111 mmol/L 107 104 105  CO2 22 - 32 mmol/L 24 27 25   Calcium 8.9 - 10.3 mg/dL 8.8(L) 9.0 9.1  Total Protein 6.5 - 8.1 g/dL 8.1 7.3 7.5  Total Bilirubin 0.3 - 1.2 mg/dL 0.3 0.5 <0.2(L)  Alkaline Phos 38 - 126 U/L 64 60 64  AST 15 - 41 U/L 20 21 22   ALT 0 - 44 U/L 18 20 25      CBC    Component Value Date/Time   WBC 4.7 10/23/2019 0808   RBC 2.23 (L) 10/23/2019 0808   HGB 7.0 (L) 10/23/2019 0808   HGB 11.1 (L) 06/04/2019 1218   HGB 10.4 (L) 10/04/2016 1154   HCT 20.9 (L) 10/23/2019 0808   HCT 31.3 (L) 10/04/2016 1154   PLT 70 (L) 10/23/2019 0808   PLT 243 06/04/2019 1218   PLT 320 10/04/2016 1154   MCV 93.7 10/23/2019 0808   MCV 95.7 10/04/2016 1154   MCH 31.4 10/23/2019 0808   MCHC 33.5 10/23/2019 0808   RDW 20.7 (H) 10/23/2019 0808   RDW 16.3 (H) 10/04/2016 1154   LYMPHSABS 1.8 10/23/2019 0808   LYMPHSABS 1.7 10/04/2016 1154   MONOABS 0.5 10/23/2019 0808   MONOABS 0.5 10/04/2016 1154   EOSABS 0.0 10/23/2019 0808   EOSABS 0.3 10/04/2016 1154   BASOSABS 0.0  10/23/2019 0808   BASOSABS 0.0 10/04/2016 1154    STUDIES: Dg Hip Unilat With Pelvis 2-3 Views Right  Result Date: 10/02/2019 CLINICAL DATA:  RIGHT hip pain, history metastatic breast cancer question metastasis EXAM: DG HIP (WITH OR WITHOUT PELVIS) 2-3V RIGHT COMPARISON:  CT pelvis 09/05/2019 FINDINGS: Osseous mineralization normal. Hip and SI joint spaces preserved. No acute fracture, dislocation, or bone destruction. No abnormal sclerotic or lytic foci are seen to suggest osseous metastasis. IMPRESSION: No acute osseous abnormalities. If patient has persistent symptoms, consider MR. Electronically Signed   By: Lavonia Dana M.D.   On: 10/02/2019 17:56    RESEARCH: Referred to PREVENT study, but was pregnant at the time; referred to [facility] 11202, but the biopsied lymph node was benign; referred to weight loss study but declined; referred to [facility] 12/09/2000, but the timing of radiation and 8 was greater than 60 days past the date of diagnosis; referred to health disparity study, but declined; referred to [id number] adjuvant therapy study, but the patient received Xeloda with radiation and therefore was ineligible   ASSESSMENT: 43 y.o. [place] woman status  post right breast upper-outer quadrant biopsy 01/28/2016 for a clinical T2 N0 invasive ductal carcinoma, grade 3, triple negative, with an MIB-1 of 70%.  (a) suspicious right axillary lymph node biopsied 01/28/2016 was benign  (1) neoadjuvant chemotherapy: doxorubicin and cyclophosphamide in dose dense fashion 4 started 04/14/16, completed 05/26/2016, followed by paclitaxel and carboplatin weekly 12, Started 06/09/2016  (a) taxol discontinued after 7 doses because of neuropathy, last dose 07/21/2016  (2) genetics testing October 20, 2016 through the 32-gene Comprehensive Cancer Panel offered by [facility] Junius Roads, MD) (with MSH2 Exons 1-7 Inversion Analysis) found no deleterious mutations or VUSS  In APC, ATM, AXIN2,  BARD1, BMPR1A, BRCA1, BRCA2, BRIP1, CDH1, CDK4, CDKN2A, CHEK2, EPCAM, FANCC, MLH1, MSH2, MSH6, MUTYH, NBN, PALB2, PMS2, POLD1, POLE, PTEN, RAD51C, RAD51D, SCG5/GREM1, SMAD4, STK11, TP53, VHL, and XRCC2.    (3) right mastectomy and sentinel lymph node sampling 09/06/2016 showed a residual ypT1c ypN0, invasive ductal carcinoma, grade 3, with negative margins. Repeat prognostic panel again triple negative   (4) adjuvant radiation with capecitabine/Xeloda sensitization 11/15/16 - 01/12/17 Site/dose:   Right Chest Wall and axilla (4 field) treated to 45 Gy in 25 fractions, and then Boosted an additional 14.4 Gy in 8 fractions.  (5) tobacco abuse disorder: The patient quit smoking 04/04/2018  METASTATIC DISEASE:  July 2020 (6) nonspecific changes noted on chest CT 06/11/2019 were clarified by PET scan 06/19/2019 showing hypermetabolic disease in the right anterior chest wall, right internal mammary nodes, right and left axillary nodes, but no metastatic disease in the neck, lungs, abdomen or pelvis.  Bone marrow uptake suggests bony metastatic disease.  (a) CARIS requested obtained from 06/28/2019 sample confirmed a triple negativity, the tumor also was negative for the androgen receptor, was MSI stable and mismatch repair status proficient, with a low mutational burden.  BRCA 1 and 2 were negative and PD-L1 was negative.  PI K3 showed a variant of uncertain significance.  However the tumor was genomic LOH high  (b) CA-27-29 is informative: was 118.3 on 06/04/2019  (7) zoledronate started 07/17/2019--on hold currently due to dental issues, and until 6 weeks at least after dental work  (8) carboplatin/ gemcitabine days 1 and 8 Q21 day cycle started 07/10/2019 (restaging in 09/2019 shows no progression, will continue current treatment and restage after 3 more cycles)   PLAN: Nemesis is doing well today.  I reviewed her pictures today with Dr. [name], and her chest wall mass is essentially unchanged.  She  is tolerating her treatment well with the exception of decreased counts.  She is receiving the carboplatin at an AUC of 2, and the Gemcitabine was previously 1052m/m2, and today we will reduce it to 8081mm2.  I reviewed her CBC with Dr. MaJana Hakimnd she will proceed with treatment today.  As far as her anemia goes, she will receive one unit of blood on Saturday.  I also placed orders today for iron studies, b12 and folate.  She and I discussed aranesp today, for her chemotherapy induced anemia, and she will hopefully be able to start this next week.  I gave her infromation about the Aranesp in her AVS.  As far as having dental extractions, I let her know that she should go ahead and get these scheduled since she is further out from the Zoledronate.  She will let usKoreanow what dates she is looking at. She was recommended to let her oral surgeon know about her treatment and also clear her to resume the Zoledronate once she has  had the dental work.    Joi will return on 11/25 for labs, and her next cycle of Gemcitabine/Carboplatin.  She was recommended to continue with the appropriate pandemic precautions. She knows to call for any questions that may arise between now and her next appointment.  We are happy to see her sooner if needed.   A total of (30) minutes of face-to-face time was spent with this patient with greater than 50% of that time in counseling and care-coordination.  Wilber Bihari, NP  10/23/19 8:54 AM Medical Oncology and Hematology [facility] [address] [place], [place] [address] Tel. [phone]    Fax. [phone]

## 2019-10-23 ENCOUNTER — Other Ambulatory Visit: Payer: Self-pay

## 2019-10-23 ENCOUNTER — Encounter: Payer: Self-pay | Admitting: Adult Health

## 2019-10-23 ENCOUNTER — Encounter: Payer: Self-pay | Admitting: Oncology

## 2019-10-23 ENCOUNTER — Inpatient Hospital Stay: Payer: Medicaid Other

## 2019-10-23 ENCOUNTER — Inpatient Hospital Stay (HOSPITAL_BASED_OUTPATIENT_CLINIC_OR_DEPARTMENT_OTHER): Payer: Medicaid Other | Admitting: Adult Health

## 2019-10-23 VITALS — BP 121/78 | HR 106 | Temp 98.7°F | Resp 18 | Ht 61.5 in | Wt 210.0 lb

## 2019-10-23 VITALS — HR 99

## 2019-10-23 DIAGNOSIS — C50919 Malignant neoplasm of unspecified site of unspecified female breast: Secondary | ICD-10-CM

## 2019-10-23 DIAGNOSIS — Z171 Estrogen receptor negative status [ER-]: Secondary | ICD-10-CM

## 2019-10-23 DIAGNOSIS — C50411 Malignant neoplasm of upper-outer quadrant of right female breast: Secondary | ICD-10-CM

## 2019-10-23 DIAGNOSIS — C7951 Secondary malignant neoplasm of bone: Secondary | ICD-10-CM

## 2019-10-23 DIAGNOSIS — Z5111 Encounter for antineoplastic chemotherapy: Secondary | ICD-10-CM | POA: Diagnosis not present

## 2019-10-23 LAB — CBC WITH DIFFERENTIAL/PLATELET
Abs Immature Granulocytes: 0.01 10*3/uL (ref 0.00–0.07)
Basophils Absolute: 0 10*3/uL (ref 0.0–0.1)
Basophils Relative: 0 %
Eosinophils Absolute: 0 10*3/uL (ref 0.0–0.5)
Eosinophils Relative: 1 %
HCT: 20.9 % — ABNORMAL LOW (ref 36.0–46.0)
Hemoglobin: 7 g/dL — ABNORMAL LOW (ref 12.0–15.0)
Immature Granulocytes: 0 %
Lymphocytes Relative: 37 %
Lymphs Abs: 1.8 10*3/uL (ref 0.7–4.0)
MCH: 31.4 pg (ref 26.0–34.0)
MCHC: 33.5 g/dL (ref 30.0–36.0)
MCV: 93.7 fL (ref 80.0–100.0)
Monocytes Absolute: 0.5 10*3/uL (ref 0.1–1.0)
Monocytes Relative: 11 %
Neutro Abs: 2.4 10*3/uL (ref 1.7–7.7)
Neutrophils Relative %: 51 %
Platelets: 70 10*3/uL — ABNORMAL LOW (ref 150–400)
RBC: 2.23 MIL/uL — ABNORMAL LOW (ref 3.87–5.11)
RDW: 20.7 % — ABNORMAL HIGH (ref 11.5–15.5)
WBC: 4.7 10*3/uL (ref 4.0–10.5)
nRBC: 0.6 % — ABNORMAL HIGH (ref 0.0–0.2)

## 2019-10-23 LAB — COMPREHENSIVE METABOLIC PANEL
ALT: 22 U/L (ref 0–44)
AST: 21 U/L (ref 15–41)
Albumin: 4.2 g/dL (ref 3.5–5.0)
Alkaline Phosphatase: 63 U/L (ref 38–126)
Anion gap: 12 (ref 5–15)
BUN: 10 mg/dL (ref 6–20)
CO2: 23 mmol/L (ref 22–32)
Calcium: 8.2 mg/dL — ABNORMAL LOW (ref 8.9–10.3)
Chloride: 106 mmol/L (ref 98–111)
Creatinine, Ser: 0.81 mg/dL (ref 0.44–1.00)
GFR calc Af Amer: >60 mL/min (ref >60)
GFR calc non Af Amer: >60 mL/min (ref >60)
Glucose, Bld: 101 mg/dL — ABNORMAL HIGH (ref 70–99)
Potassium: 3.5 mmol/L (ref 3.5–5.1)
Sodium: 141 mmol/L (ref 135–145)
Total Bilirubin: <0.2 mg/dL — ABNORMAL LOW (ref 0.3–1.2)
Total Protein: 7.7 g/dL (ref 6.5–8.1)

## 2019-10-23 LAB — FOLATE: Folate: 7.6 ng/mL (ref >5.9)

## 2019-10-23 LAB — SAMPLE TO BLOOD BANK

## 2019-10-23 LAB — IRON AND TIBC
Iron: 47 ug/dL (ref 41–142)
Saturation Ratios: 16 % — ABNORMAL LOW (ref 21–57)
TIBC: 289 ug/dL (ref 236–444)
UIBC: 242 ug/dL (ref 120–384)

## 2019-10-23 LAB — FERRITIN: Ferritin: 381 ng/mL — ABNORMAL HIGH (ref 11–307)

## 2019-10-23 LAB — VITAMIN B12: Vitamin B-12: 610 pg/mL (ref 180–914)

## 2019-10-23 MED ORDER — HEPARIN SOD (PORK) LOCK FLUSH 100 UNIT/ML IV SOLN
500.0000 [IU] | Freq: Once | INTRAVENOUS | Status: AC | PRN
  Administered 2019-10-23: 500 [IU]
  Filled 2019-10-23: qty 5

## 2019-10-23 MED ORDER — PALONOSETRON HCL INJECTION 0.25 MG/5ML
0.2500 mg | Freq: Once | INTRAVENOUS | Status: AC
  Administered 2019-10-23: 0.25 mg via INTRAVENOUS

## 2019-10-23 MED ORDER — SODIUM CHLORIDE 0.9% FLUSH
10.0000 mL | INTRAVENOUS | Status: DC | PRN
  Administered 2019-10-23: 10 mL
  Filled 2019-10-23: qty 10

## 2019-10-23 MED ORDER — FAMOTIDINE IN NACL 20-0.9 MG/50ML-% IV SOLN
20.0000 mg | Freq: Once | INTRAVENOUS | Status: AC
  Administered 2019-10-23: 20 mg via INTRAVENOUS

## 2019-10-23 MED ORDER — DEXAMETHASONE SODIUM PHOSPHATE 10 MG/ML IJ SOLN
INTRAMUSCULAR | Status: AC
  Filled 2019-10-23: qty 1

## 2019-10-23 MED ORDER — PALONOSETRON HCL INJECTION 0.25 MG/5ML
INTRAVENOUS | Status: AC
  Filled 2019-10-23: qty 5

## 2019-10-23 MED ORDER — DIPHENHYDRAMINE HCL 50 MG/ML IJ SOLN
INTRAMUSCULAR | Status: AC
  Filled 2019-10-23: qty 1

## 2019-10-23 MED ORDER — DIPHENHYDRAMINE HCL 50 MG/ML IJ SOLN
25.0000 mg | Freq: Once | INTRAMUSCULAR | Status: AC
  Administered 2019-10-23: 25 mg via INTRAVENOUS

## 2019-10-23 MED ORDER — DEXAMETHASONE SODIUM PHOSPHATE 10 MG/ML IJ SOLN
10.0000 mg | Freq: Once | INTRAMUSCULAR | Status: AC
  Administered 2019-10-23: 10 mg via INTRAVENOUS

## 2019-10-23 MED ORDER — SODIUM CHLORIDE 0.9 % IV SOLN
Freq: Once | INTRAVENOUS | Status: AC
  Administered 2019-10-23: 10:00:00 via INTRAVENOUS
  Filled 2019-10-23: qty 250

## 2019-10-23 MED ORDER — FAMOTIDINE IN NACL 20-0.9 MG/50ML-% IV SOLN
INTRAVENOUS | Status: AC
  Filled 2019-10-23: qty 50

## 2019-10-23 MED ORDER — SODIUM CHLORIDE 0.9 % IV SOLN
270.0000 mg | Freq: Once | INTRAVENOUS | Status: AC
  Administered 2019-10-23: 270 mg via INTRAVENOUS
  Filled 2019-10-23: qty 27

## 2019-10-23 MED ORDER — SODIUM CHLORIDE 0.9 % IV SOLN
800.0000 mg/m2 | Freq: Once | INTRAVENOUS | Status: AC
  Administered 2019-10-23: 1634 mg via INTRAVENOUS
  Filled 2019-10-23: qty 42.98

## 2019-10-23 NOTE — Patient Instructions (Signed)
Darbepoetin Alfa injection What is this medicine? DARBEPOETIN ALFA (dar be POE e tin AL fa) helps your body make more red blood cells. It is used to treat anemia caused by chronic kidney failure and chemotherapy. This medicine may be used for other purposes; ask your health care provider or pharmacist if you have questions. COMMON BRAND NAME(S): Aranesp What should I tell my health care provider before I take this medicine? They need to know if you have any of these conditions:  blood clotting disorders or history of blood clots  cancer patient not on chemotherapy  cystic fibrosis  heart disease, such as angina, heart failure, or a history of a heart attack  hemoglobin level of 12 g/dL or greater  high blood pressure  low levels of folate, iron, or vitamin B12  seizures  an unusual or allergic reaction to darbepoetin, erythropoietin, albumin, hamster proteins, latex, other medicines, foods, dyes, or preservatives  pregnant or trying to get pregnant  breast-feeding How should I use this medicine? This medicine is for injection into a vein or under the skin. It is usually given by a health care professional in a hospital or clinic setting. If you get this medicine at home, you will be taught how to prepare and give this medicine. Use exactly as directed. Take your medicine at regular intervals. Do not take your medicine more often than directed. It is important that you put your used needles and syringes in a special sharps container. Do not put them in a trash can. If you do not have a sharps container, call your pharmacist or healthcare provider to get one. A special MedGuide will be given to you by the pharmacist with each prescription and refill. Be sure to read this information carefully each time. Talk to your pediatrician regarding the use of this medicine in children. While this medicine may be used in children as young as 1 month of age for selected conditions, precautions do  apply. Overdosage: If you think you have taken too much of this medicine contact a poison control center or emergency room at once. NOTE: This medicine is only for you. Do not share this medicine with others. What if I miss a dose? If you miss a dose, take it as soon as you can. If it is almost time for your next dose, take only that dose. Do not take double or extra doses. What may interact with this medicine? Do not take this medicine with any of the following medications:  epoetin alfa This list may not describe all possible interactions. Give your health care provider a list of all the medicines, herbs, non-prescription drugs, or dietary supplements you use. Also tell them if you smoke, drink alcohol, or use illegal drugs. Some items may interact with your medicine. What should I watch for while using this medicine? Your condition will be monitored carefully while you are receiving this medicine. You may need blood work done while you are taking this medicine. This medicine may cause a decrease in vitamin B6. You should make sure that you get enough vitamin B6 while you are taking this medicine. Discuss the foods you eat and the vitamins you take with your health care professional. What side effects may I notice from receiving this medicine? Side effects that you should report to your doctor or health care professional as soon as possible:  allergic reactions like skin rash, itching or hives, swelling of the face, lips, or tongue  breathing problems  changes in   vision  chest pain  confusion, trouble speaking or understanding  feeling faint or lightheaded, falls  high blood pressure  muscle aches or pains  pain, swelling, warmth in the leg  rapid weight gain  severe headaches  sudden numbness or weakness of the face, arm or leg  trouble walking, dizziness, loss of balance or coordination  seizures (convulsions)  swelling of the ankles, feet, hands  unusually weak or  tired Side effects that usually do not require medical attention (report to your doctor or health care professional if they continue or are bothersome):  diarrhea  fever, chills (flu-like symptoms)  headaches  nausea, vomiting  redness, stinging, or swelling at site where injected This list may not describe all possible side effects. Call your doctor for medical advice about side effects. You may report side effects to FDA at 1-800-FDA-1088. Where should I keep my medicine? Keep out of the reach of children. Store in a refrigerator between 2 and 8 degrees C (36 and 46 degrees F). Do not freeze. Do not shake. Throw away any unused portion if using a single-dose vial. Throw away any unused medicine after the expiration date. NOTE: This sheet is a summary. It may not cover all possible information. If you have questions about this medicine, talk to your doctor, pharmacist, or health care provider.  2020 Elsevier/Gold Standard (2017-12-06 16:44:20)  

## 2019-10-23 NOTE — Progress Notes (Signed)
Right chest wall [id number]

## 2019-10-23 NOTE — Patient Instructions (Signed)
[facility] Discharge Instructions for Patients Receiving Chemotherapy  Today you received the following chemotherapy agents: Gemzar, Carboplatin   To help prevent nausea and vomiting after your treatment, we encourage you to take your nausea medication as directed.   If you develop nausea and vomiting that is not controlled by your nausea medication, call the clinic.   BELOW ARE SYMPTOMS THAT SHOULD BE REPORTED IMMEDIATELY:  *FEVER GREATER THAN 100.5 F  *CHILLS WITH OR WITHOUT FEVER  NAUSEA AND VOMITING THAT IS NOT CONTROLLED WITH YOUR NAUSEA MEDICATION  *UNUSUAL SHORTNESS OF BREATH  *UNUSUAL BRUISING OR BLEEDING  TENDERNESS IN MOUTH AND THROAT WITH OR WITHOUT PRESENCE OF ULCERS  *URINARY PROBLEMS  *BOWEL PROBLEMS  UNUSUAL RASH Items with * indicate a potential emergency and should be followed up as soon as possible.  Feel free to call the clinic should you have any questions or concerns. The clinic phone number is [phone].  Please show the CHEMO ALERT CARD at check-in to the Emergency Department and triage nurse.   

## 2019-10-23 NOTE — Progress Notes (Signed)
Labs reported to Wilber Bihari, NP. Received OK to treat.

## 2019-10-24 ENCOUNTER — Other Ambulatory Visit: Payer: Self-pay | Admitting: Emergency Medicine

## 2019-10-24 DIAGNOSIS — C7951 Secondary malignant neoplasm of bone: Secondary | ICD-10-CM

## 2019-10-24 DIAGNOSIS — C50411 Malignant neoplasm of upper-outer quadrant of right female breast: Secondary | ICD-10-CM

## 2019-10-24 DIAGNOSIS — C50919 Malignant neoplasm of unspecified site of unspecified female breast: Secondary | ICD-10-CM

## 2019-10-24 DIAGNOSIS — Z171 Estrogen receptor negative status [ER-]: Secondary | ICD-10-CM

## 2019-10-24 LAB — CANCER ANTIGEN 27.29: CA 27.29: 183.7 U/mL — ABNORMAL HIGH (ref 0.0–38.6)

## 2019-10-25 ENCOUNTER — Other Ambulatory Visit: Payer: Self-pay | Admitting: *Deleted

## 2019-10-25 ENCOUNTER — Other Ambulatory Visit: Payer: Self-pay | Admitting: Oncology

## 2019-10-25 DIAGNOSIS — C50411 Malignant neoplasm of upper-outer quadrant of right female breast: Secondary | ICD-10-CM

## 2019-10-25 DIAGNOSIS — Z171 Estrogen receptor negative status [ER-]: Secondary | ICD-10-CM

## 2019-10-25 DIAGNOSIS — C50919 Malignant neoplasm of unspecified site of unspecified female breast: Secondary | ICD-10-CM

## 2019-10-25 LAB — PREPARE RBC (CROSSMATCH)

## 2019-10-26 ENCOUNTER — Other Ambulatory Visit: Payer: Self-pay

## 2019-10-26 ENCOUNTER — Inpatient Hospital Stay: Payer: Medicaid Other

## 2019-10-26 DIAGNOSIS — C7951 Secondary malignant neoplasm of bone: Secondary | ICD-10-CM

## 2019-10-26 DIAGNOSIS — Z171 Estrogen receptor negative status [ER-]: Secondary | ICD-10-CM

## 2019-10-26 DIAGNOSIS — Z5111 Encounter for antineoplastic chemotherapy: Secondary | ICD-10-CM | POA: Diagnosis not present

## 2019-10-26 DIAGNOSIS — C50411 Malignant neoplasm of upper-outer quadrant of right female breast: Secondary | ICD-10-CM

## 2019-10-26 DIAGNOSIS — C50919 Malignant neoplasm of unspecified site of unspecified female breast: Secondary | ICD-10-CM

## 2019-10-26 LAB — SAMPLE TO BLOOD BANK

## 2019-10-26 MED ORDER — SODIUM CHLORIDE 0.9% IV SOLUTION
250.0000 mL | Freq: Once | INTRAVENOUS | Status: AC
  Administered 2019-10-26: 250 mL via INTRAVENOUS
  Filled 2019-10-26: qty 250

## 2019-10-26 MED ORDER — SODIUM CHLORIDE 0.9% FLUSH
3.0000 mL | INTRAVENOUS | Status: DC | PRN
  Filled 2019-10-26: qty 10

## 2019-10-26 MED ORDER — HEPARIN SOD (PORK) LOCK FLUSH 100 UNIT/ML IV SOLN
250.0000 [IU] | INTRAVENOUS | Status: DC | PRN
  Filled 2019-10-26: qty 5

## 2019-10-26 MED ORDER — SODIUM CHLORIDE 0.9% FLUSH
10.0000 mL | INTRAVENOUS | Status: AC | PRN
  Administered 2019-10-26: 10 mL
  Filled 2019-10-26: qty 10

## 2019-10-26 MED ORDER — HEPARIN SOD (PORK) LOCK FLUSH 100 UNIT/ML IV SOLN
500.0000 [IU] | Freq: Every day | INTRAVENOUS | Status: AC | PRN
  Administered 2019-10-26: 500 [IU]
  Filled 2019-10-26: qty 5

## 2019-10-26 MED ORDER — DIPHENHYDRAMINE HCL 25 MG PO CAPS
ORAL_CAPSULE | ORAL | Status: AC
  Filled 2019-10-26: qty 1

## 2019-10-26 MED ORDER — ACETAMINOPHEN 325 MG PO TABS
ORAL_TABLET | ORAL | Status: AC
  Filled 2019-10-26: qty 2

## 2019-10-26 MED ORDER — DIPHENHYDRAMINE HCL 25 MG PO CAPS
25.0000 mg | ORAL_CAPSULE | Freq: Once | ORAL | Status: AC
  Administered 2019-10-26: 25 mg via ORAL

## 2019-10-26 MED ORDER — HEPARIN SOD (PORK) LOCK FLUSH 100 UNIT/ML IV SOLN
500.0000 [IU] | Freq: Every day | INTRAVENOUS | Status: DC | PRN
  Filled 2019-10-26: qty 5

## 2019-10-26 MED ORDER — ACETAMINOPHEN 325 MG PO TABS
650.0000 mg | ORAL_TABLET | Freq: Once | ORAL | Status: AC
  Administered 2019-10-26: 650 mg via ORAL

## 2019-10-26 NOTE — Patient Instructions (Signed)
Blood Transfusion, Adult, Care After This sheet gives you information about how to care for yourself after your procedure. Your doctor may also give you more specific instructions. If you have problems or questions, contact your doctor. Follow these instructions at home:   Take over-the-counter and prescription medicines only as told by your doctor.  Go back to your normal activities as told by your doctor.  Follow instructions from your doctor about how to take care of the area where an IV tube was put into your vein (insertion site). Make sure you: ? Wash your hands with soap and water before you change your bandage (dressing). If there is no soap and water, use hand sanitizer. ? Change your bandage as told by your doctor.  Check your IV insertion site every day for signs of infection. Check for: ? More redness, swelling, or pain. ? More fluid or blood. ? Warmth. ? Pus or a bad smell. Contact a doctor if:  You have more redness, swelling, or pain around the IV insertion site.  You have more fluid or blood coming from the IV insertion site.  Your IV insertion site feels warm to the touch.  You have pus or a bad smell coming from the IV insertion site.  Your pee (urine) turns pink, red, or brown.  You feel weak after doing your normal activities. Get help right away if:  You have signs of a serious allergic or body defense (immune) system reaction, including: ? Itchiness. ? Hives. ? Trouble breathing. ? Anxiety. ? Pain in your chest or lower back. ? Fever, flushing, and chills. ? Fast pulse. ? Rash. ? Watery poop (diarrhea). ? Throwing up (vomiting). ? Dark pee. ? Serious headache. ? Dizziness. ? Stiff neck. ? Yellow color in your face or the white parts of your eyes (jaundice). Summary  After a blood transfusion, return to your normal activities as told by your doctor.  Every day, check for signs of infection where the IV tube was put into your vein.  Some  signs of infection are warm skin, more redness and pain, more fluid or blood, and pus or a bad smell where the needle went in.  Contact your doctor if you feel weak or have any unusual symptoms. This information is not intended to replace advice given to you by your health care provider. Make sure you discuss any questions you have with your health care provider. Document Released: 12/12/2014 Document Revised: 03/28/2018 Document Reviewed: 07/15/2016 Elsevier Patient Education  2020 Elsevier Inc.  

## 2019-10-27 LAB — TYPE AND SCREEN
ABO/RH(D): O POS
Antibody Screen: NEGATIVE
Unit division: 0

## 2019-10-27 LAB — BPAM RBC
Blood Product Expiration Date: 202012212359
ISSUE DATE / TIME: 202011210854
Unit Type and Rh: 5100

## 2019-10-30 ENCOUNTER — Inpatient Hospital Stay: Payer: Medicaid Other

## 2019-10-30 ENCOUNTER — Other Ambulatory Visit: Payer: Self-pay

## 2019-10-30 VITALS — BP 120/74 | HR 95 | Temp 97.3°F | Resp 17

## 2019-10-30 DIAGNOSIS — Z171 Estrogen receptor negative status [ER-]: Secondary | ICD-10-CM

## 2019-10-30 DIAGNOSIS — C50411 Malignant neoplasm of upper-outer quadrant of right female breast: Secondary | ICD-10-CM

## 2019-10-30 DIAGNOSIS — C7951 Secondary malignant neoplasm of bone: Secondary | ICD-10-CM

## 2019-10-30 DIAGNOSIS — Z5111 Encounter for antineoplastic chemotherapy: Secondary | ICD-10-CM | POA: Diagnosis not present

## 2019-10-30 DIAGNOSIS — C50919 Malignant neoplasm of unspecified site of unspecified female breast: Secondary | ICD-10-CM

## 2019-10-30 LAB — COMPREHENSIVE METABOLIC PANEL
ALT: 27 U/L (ref 0–44)
AST: 27 U/L (ref 15–41)
Albumin: 4.1 g/dL (ref 3.5–5.0)
Alkaline Phosphatase: 62 U/L (ref 38–126)
Anion gap: 10 (ref 5–15)
BUN: 8 mg/dL (ref 6–20)
CO2: 25 mmol/L (ref 22–32)
Calcium: 8.9 mg/dL (ref 8.9–10.3)
Chloride: 106 mmol/L (ref 98–111)
Creatinine, Ser: 0.77 mg/dL (ref 0.44–1.00)
GFR calc Af Amer: >60 mL/min (ref >60)
GFR calc non Af Amer: >60 mL/min (ref >60)
Glucose, Bld: 85 mg/dL (ref 70–99)
Potassium: 3.8 mmol/L (ref 3.5–5.1)
Sodium: 141 mmol/L (ref 135–145)
Total Bilirubin: 0.3 mg/dL (ref 0.3–1.2)
Total Protein: 7.6 g/dL (ref 6.5–8.1)

## 2019-10-30 LAB — CBC WITH DIFFERENTIAL/PLATELET
Abs Immature Granulocytes: 0.02 10*3/uL (ref 0.00–0.07)
Basophils Absolute: 0 10*3/uL (ref 0.0–0.1)
Basophils Relative: 0 %
Eosinophils Absolute: 0 10*3/uL (ref 0.0–0.5)
Eosinophils Relative: 1 %
HCT: 22 % — ABNORMAL LOW (ref 36.0–46.0)
Hemoglobin: 7.5 g/dL — ABNORMAL LOW (ref 12.0–15.0)
Immature Granulocytes: 1 %
Lymphocytes Relative: 47 %
Lymphs Abs: 1.6 10*3/uL (ref 0.7–4.0)
MCH: 31.3 pg (ref 26.0–34.0)
MCHC: 34.1 g/dL (ref 30.0–36.0)
MCV: 91.7 fL (ref 80.0–100.0)
Monocytes Absolute: 0.2 10*3/uL (ref 0.1–1.0)
Monocytes Relative: 7 %
Neutro Abs: 1.5 10*3/uL — ABNORMAL LOW (ref 1.7–7.7)
Neutrophils Relative %: 44 %
Platelets: 94 10*3/uL — ABNORMAL LOW (ref 150–400)
RBC: 2.4 MIL/uL — ABNORMAL LOW (ref 3.87–5.11)
RDW: 19.2 % — ABNORMAL HIGH (ref 11.5–15.5)
WBC: 3.3 10*3/uL — ABNORMAL LOW (ref 4.0–10.5)
nRBC: 0.6 % — ABNORMAL HIGH (ref 0.0–0.2)

## 2019-10-30 LAB — RETICULOCYTES
Immature Retic Fract: 11.5 % (ref 2.3–15.9)
RBC.: 2.4 MIL/uL — ABNORMAL LOW (ref 3.87–5.11)
Retic Count, Absolute: 7.9 10*3/uL — ABNORMAL LOW (ref 19.0–186.0)
Retic Ct Pct: 0.4 % (ref 0.4–3.1)

## 2019-10-30 LAB — SAMPLE TO BLOOD BANK

## 2019-10-30 LAB — FERRITIN: Ferritin: 520 ng/mL — ABNORMAL HIGH (ref 11–307)

## 2019-10-30 MED ORDER — DIPHENHYDRAMINE HCL 50 MG/ML IJ SOLN
INTRAMUSCULAR | Status: AC
  Filled 2019-10-30: qty 1

## 2019-10-30 MED ORDER — PALONOSETRON HCL INJECTION 0.25 MG/5ML
0.2500 mg | Freq: Once | INTRAVENOUS | Status: AC
  Administered 2019-10-30: 0.25 mg via INTRAVENOUS

## 2019-10-30 MED ORDER — SODIUM CHLORIDE 0.9 % IV SOLN
1600.0000 mg | Freq: Once | INTRAVENOUS | Status: AC
  Administered 2019-10-30: 1600 mg via INTRAVENOUS
  Filled 2019-10-30: qty 42.08

## 2019-10-30 MED ORDER — HEPARIN SOD (PORK) LOCK FLUSH 100 UNIT/ML IV SOLN
500.0000 [IU] | Freq: Once | INTRAVENOUS | Status: AC | PRN
  Administered 2019-10-30: 500 [IU]
  Filled 2019-10-30: qty 5

## 2019-10-30 MED ORDER — PALONOSETRON HCL INJECTION 0.25 MG/5ML
INTRAVENOUS | Status: AC
  Filled 2019-10-30: qty 5

## 2019-10-30 MED ORDER — DIPHENHYDRAMINE HCL 50 MG/ML IJ SOLN
25.0000 mg | Freq: Once | INTRAMUSCULAR | Status: AC
  Administered 2019-10-30: 25 mg via INTRAVENOUS

## 2019-10-30 MED ORDER — DEXAMETHASONE SODIUM PHOSPHATE 10 MG/ML IJ SOLN
10.0000 mg | Freq: Once | INTRAMUSCULAR | Status: AC
  Administered 2019-10-30: 10 mg via INTRAVENOUS

## 2019-10-30 MED ORDER — SODIUM CHLORIDE 0.9 % IV SOLN
270.0000 mg | Freq: Once | INTRAVENOUS | Status: AC
  Administered 2019-10-30: 270 mg via INTRAVENOUS
  Filled 2019-10-30: qty 27

## 2019-10-30 MED ORDER — FAMOTIDINE IN NACL 20-0.9 MG/50ML-% IV SOLN
INTRAVENOUS | Status: AC
  Filled 2019-10-30: qty 50

## 2019-10-30 MED ORDER — SODIUM CHLORIDE 0.9% FLUSH
10.0000 mL | INTRAVENOUS | Status: DC | PRN
  Administered 2019-10-30: 10 mL
  Filled 2019-10-30: qty 10

## 2019-10-30 MED ORDER — DEXAMETHASONE SODIUM PHOSPHATE 10 MG/ML IJ SOLN
INTRAMUSCULAR | Status: AC
  Filled 2019-10-30: qty 1

## 2019-10-30 MED ORDER — SODIUM CHLORIDE 0.9 % IV SOLN
Freq: Once | INTRAVENOUS | Status: AC
  Administered 2019-10-30: 10:00:00 via INTRAVENOUS
  Filled 2019-10-30: qty 250

## 2019-10-30 MED ORDER — FAMOTIDINE IN NACL 20-0.9 MG/50ML-% IV SOLN
20.0000 mg | Freq: Once | INTRAVENOUS | Status: AC
  Administered 2019-10-30: 20 mg via INTRAVENOUS

## 2019-10-30 NOTE — Patient Instructions (Signed)
[facility] Discharge Instructions for Patients Receiving Chemotherapy  Today you received the following chemotherapy agents: Gemzar, Carboplatin   To help prevent nausea and vomiting after your treatment, we encourage you to take your nausea medication as directed.   If you develop nausea and vomiting that is not controlled by your nausea medication, call the clinic.   BELOW ARE SYMPTOMS THAT SHOULD BE REPORTED IMMEDIATELY:  *FEVER GREATER THAN 100.5 F  *CHILLS WITH OR WITHOUT FEVER  NAUSEA AND VOMITING THAT IS NOT CONTROLLED WITH YOUR NAUSEA MEDICATION  *UNUSUAL SHORTNESS OF BREATH  *UNUSUAL BRUISING OR BLEEDING  TENDERNESS IN MOUTH AND THROAT WITH OR WITHOUT PRESENCE OF ULCERS  *URINARY PROBLEMS  *BOWEL PROBLEMS  UNUSUAL RASH Items with * indicate a potential emergency and should be followed up as soon as possible.  Feel free to call the clinic should you have any questions or concerns. The clinic phone number is [phone].  Please show the CHEMO ALERT CARD at check-in to the Emergency Department and triage nurse.   

## 2019-10-30 NOTE — Progress Notes (Signed)
Hemoglobin and Platelet counts reported to Dr. [name]. Received OK to treat.

## 2019-11-05 NOTE — Progress Notes (Signed)
[facility]  Telephone:(336) [phone] Fax:(336) [phone]   ID: Unknown Rebecca [name] DOB: 09-05-1976  MR#: [id number]  LYY#:[id number]  Patient Care Team: Dorena Dew, FNP as PCP - General (Family Medicine) Alphonsa Overall, MD as Consulting Physician (General Surgery) , [name], MD as Consulting Physician (Oncology) Gery Pray, MD as Consulting Physician (Radiation Oncology) Delice Bison, Charlestine Massed, NP as Nurse Practitioner (Hematology and Oncology) Alda Berthold, DO as Consulting Physician (Neurology) OTHER MD:  CHIEF COMPLAINT: triple negative stage IV breast cancer  CURRENT TREATMENT: [carboplatin/ gemcitabine]; zoledronate   INTERVAL HISTORY: Winnell returns today for follow-up and treatment of he rtriple negative breast cancer.    She has completed 6 cycles of carboplatin and gemcitabine.   She receives treatment on days 1 and 8 of each 21-day cycle.    She was supposed to have had restaging studies prior to today's visit and they were ordered but for some reason they still have not been scheduled.  Her zolendronate is currently on hold pending some dental work   REVIEW OF SYSTEMS: [name] has tolerated her treatment well.  She does not have problems with nausea or significant fatigue.  She is still working as a Actuary during the day.  She does all her housework at home and takes care of her children.  She is taking appropriate pandemic precautions within the limits of those activities.  She has had no unusual headaches visual changes or any balance problems or falls.  She is mildly constipated.  A detailed review of systems today was otherwise stable   BREAST CANCER HISTORY: From the original intake note:  Rebecca [name] herself noted a change in her right breast sometime around September or October 2016. She did not bring it to intermediate medical attention, but on 01/13/2016 she established herself in Dr. Smith Robert' service and she was set up for bilateral diagnostic  mammography with tomosynthesis and bilateral ultrasonography at the [facility] 01/19/2016. The breast density was category B. In the upper outer quadrant of the right breast there was a spiculated mass measuring 2.8 cm. On physical exam this was palpable. Targeted ultrasonography confirmed an irregular hypoechoic mass in the right breast 11:30 o'clock position measuring 2.6 cm maximally. Ultrasound of the right axilla showed a morphologically abnormal lymph node.  In the left breast there were some tubular densities behind the areola which by ultrasonography showed benign ductal ectasia.  On 01/28/2016 [name] underwent biopsy of the right breast mass and abnormal right axillary lymph node. The pathology from this procedure (S AAA [id number]) showed the lymph node to be benign. In the breast however there was an invasive ductal carcinoma, grade 3, which was estrogen and progesterone receptor negative. The proliferation marker was 70%. HER-2 was not amplified with a signals ratio of 1.32. The number per cell was 2.05.  The patient's subsequent history is as detailed below   PAST MEDICAL HISTORY: Past Medical History:  Diagnosis Date  . Anxiety   . Breast cancer ([place])   . Depression   . GERD (gastroesophageal reflux disease)   . History of radiation therapy 11/15/16-01/12/17   right chest wall and axilla treated to 45 Gy in 25 fractions, boosted and additional 14 Gy in 8 fractions  . Hypertension    diet controlled  . Obesity (BMI 35.0-39.9 without comorbidity)   . Pneumonia    as a child  . Seasonal allergies   . Sickle cell trait ([place])   . Termination of pregnancy (fetus) 04/02/16    PAST  SURGICAL HISTORY: Past Surgical History:  Procedure Laterality Date  . CESAREAN SECTION     2004 and 2007  . MASTECTOMY W/ SENTINEL NODE BIOPSY Right 09/06/2016   Procedure: RIGHT BREAST MASTECTOMY WITH RIGHT AXILLARY SENTINEL LYMPH NODE BIOPSY;  Surgeon: Alphonsa Overall, MD;  Location: [facility];  Service: General;  Laterality: Right;  . PORT-A-CATH REMOVAL Left 09/06/2016   Procedure: REMOVAL PORT-A-CATH;  Surgeon: Alphonsa Overall, MD;  Location: [facility];  Service: General;  Laterality: Left;  . PORTACATH PLACEMENT    . PORTACATH PLACEMENT N/A 07/09/2019   Procedure: INSERTION PORT-A-CATH WITH ULTRASOUND;  Surgeon: Alphonsa Overall, MD;  Location[facility];  Service: General;  Laterality: N/A;    FAMILY HISTORY Family History  Problem Relation Age of Onset  . Hypertension Mother   . Cancer Mother        dx "intestinal cancer" in her 54s; +surgery  . Other Mother        hysterectomy at young age for unspecified cause  . Heart Problems Mother   . Breast cancer Cousin        maternal 1st cousin dx female breast cancer at 32-46y  . Cancer Father   . Hypertension Father   . Heart Problems Maternal Aunt   . Diabetes Maternal Aunt   . Breast cancer Maternal Uncle        dx 64-65  . Heart Problems Maternal Uncle   . Breast cancer Maternal Grandmother 64  . Throat cancer Maternal Grandfather        d. 10s; smoker  . Sickle cell anemia Paternal Aunt   . Congestive Heart Failure Maternal Aunt   . Multiple sclerosis Cousin   . Cancer Other        maternal great uncle (MGM's brother); cancer removed from his side  . Heart attack Paternal Aunt        d. early 38s  The patient has very little information about her father. Her mother is currently 34 years old. She had a history of cervical cancer at age 56. The patient had 2 brothers, no sisters. The maternal grandfather had throat cancer. A maternal uncle was diagnosed with breast cancer as well as prostate cancer at the age of 16. 2 maternal cousins, one of the mail, had breast cancer as well.   GYNECOLOGIC HISTORY:  No LMP recorded. Menarche age 3, first live birth age 24. The patient is GX P4. She was still having regular periods at the time of diagnosis. She took oral contraceptives in the 1990s with no side  effects.--.  Stopped with chemotherapy and have not resumed as of May 2019   SOCIAL HISTORY:  (Updated 10/2018) She works as a Market researcher. The patient's significant other [name] works at [employer].  At home with the patient are her 3 children [name], Temple and Alton. There are age 2, 54, and 54 as of November 2019.  2 of them are disabled or have significant health problems, one with sickle cell disease, the other with autism and developmental delay.  The patient's son [name], currently 67 years old, lives in [place].    ADVANCED DIRECTIVES: Not in place   HEALTH MAINTENANCE: Social History   Socioeconomic History  . Marital status: Single    Spouse name: Not on file  . Number of children: 4  . Years of education: Not on file  . Highest education level: Not on file  Occupational History  . Occupation: Private Care Attendant    Comment: First choice [employer]  . Financial resource strain: Not on file  . Food insecurity    Worry: Never true    Inability: Never true  . Transportation needs    Medical: No    Non-medical: No  Tobacco Use  . Smoking status: Former Smoker    Packs/day: 1.00    Years: 20.00    Pack years: 20.00    Types: Cigarettes    Quit date: 04/01/2018    Years since quitting: 1.6  . Smokeless tobacco: Never Used  . Tobacco comment: Patient has quit smoking x 1 year now  Substance and Sexual Activity  . Alcohol use: Yes    Comment: occ  . Drug use: No  . Sexual activity: Not on file  Lifestyle  . Physical activity    Days per week: 7 days    Minutes per session: 60 min  . Stress: Not at all  Relationships  . Social Herbalist on phone: Not on file    Gets together: Not on file    Attends religious service: Not on file    Active member of club or organization: Not on file    Attends meetings of clubs or organizations: Not on file    Relationship status: Not on  file  Other Topics Concern  . Not on file  Social History Narrative  . Not on file     Colonoscopy:  PAP:  Bone density:  Lipid panel:  No Known Allergies  Current Outpatient Medications on File Prior to Visit  Medication Sig Dispense Refill  . calcium carbonate (TUMS - DOSED IN MG ELEMENTAL CALCIUM) 500 MG chewable tablet Chew 1 tablet by mouth daily as needed for indigestion or heartburn.    . dexamethasone (DECADRON) 4 MG tablet Take 2 tablets (8 mg total) by mouth daily. Start the day after chemotherapy for 2 days. Take with food. 30 tablet 1  . lidocaine-prilocaine (EMLA) cream Apply to affected area once 30 g 3  . metroNIDAZOLE (FLAGYL) 500 MG tablet Take 1 tablet (500 mg total) by mouth 2 (two) times daily. Crush one tablet and apply to affected site two times a day with dressing changes 60 tablet 1  . omeprazole (PRILOSEC) 20 MG capsule Take 1 capsule (20 mg total) by mouth daily. 30 capsule 1  . prochlorperazine (COMPAZINE) 10 MG tablet Take 1 tablet (10 mg total) by mouth every 6 (six) hours as needed (Nausea or vomiting). 30 tablet 1   No current facility-administered medications on file prior to visit.     OBJECTIVE: [name] in no acute distress  Vitals:   11/06/19 0919  BP: 120/76  Pulse: 94  Resp: 18  Temp: 99.1 F (37.3 C)  SpO2: 100%   Wt Readings from Last 3 Encounters:  11/06/19 209 lb 9.6 oz (95.1 kg)  10/23/19 210 lb (95.3 kg)  10/09/19 207 lb 11.2 oz (94.2 kg)   Body mass index is 38.96 kg/m.    ECOG FS:1 - Symptomatic but completely ambulatory  Sclerae unicteric, EOMs intact Wearing a mask Lungs no rales or rhonchi Heart regular rate and rhythm Abd soft, nontender, positive bowel sounds MSK no focal spinal tenderness, no upper extremity lymphedema Neuro: nonfocal, well oriented, appropriate affect Breasts: The right breast is status post mastectomy.  It is imaged below.  The area of recurrence appears slightly  progressive as compared  to prior.  Left breast is benign.  Both axillae are benign.  Right chest wall 11/06/2019    09/11/2019 on the left; 10/23/2019 on the right      LAB RESULTS: CMP Latest Ref Rng & Units 10/30/2019 10/23/2019 10/09/2019  Glucose 70 - 99 mg/dL 85 101(H) 89  BUN 6 - 20 mg/dL _0 Creatinine 0.44 - 1.00 mg/dL 0.77 0.81 0.78  Sodium 135 - 145 mmol/L 141 141 142  Potassium 3.5 - 5.1 mmol/L 3.8 3.5 3.8  Chloride 98 - 111 mmol/L 106 106 107  CO2 22 - 32 mmol/L _1 Calcium 8.9 - 10.3 mg/dL 8.9 8.2(L) 8.8(L)  Total Protein 6.5 - 8.1 g/dL 7.6 7.7 8.1  Total Bilirubin 0.3 - 1.2 mg/dL 0.3 <0.2(L) 0.3  Alkaline Phos 38 - 126 U/L 62 63 64  AST 15 - 41 U/L _2 ALT 0 - 44 U/L _3 CBC    Component Value Date/Time   WBC 3.3 (L) 10/30/2019 0849   RBC 2.40 (L) 10/30/2019 0849   RBC 2.40 (L) 10/30/2019 0849   HGB 7.5 (L) 10/30/2019 0849   HGB 11.1 (L) 06/04/2019 1218   HGB 10.4 (L) 10/04/2016 1154   HCT 22.0 (L) 10/30/2019 0849   HCT 31.3 (L) 10/04/2016 1154   PLT 94 (L) 10/30/2019 0849   PLT 243 06/04/2019 1218   PLT 320 10/04/2016 1154   MCV 91.7 10/30/2019 0849   MCV 95.7 10/04/2016 1154   MCH 31.3 10/30/2019 0849   MCHC 34.1 10/30/2019 0849   RDW 19.2 (H) 10/30/2019 0849   RDW 16.3 (H) 10/04/2016 1154   LYMPHSABS 1.6 10/30/2019 0849   LYMPHSABS 1.7 10/04/2016 1154   MONOABS 0.2 10/30/2019 0849   MONOABS 0.5 10/04/2016 1154   EOSABS 0.0 10/30/2019 0849   EOSABS 0.3 10/04/2016 1154   BASOSABS 0.0 10/30/2019 0849   BASOSABS 0.0 10/04/2016 1154    STUDIES: No results found.  RESEARCH: Referred to PREVENT study, but was pregnant at the time; referred to [facility] 11202, but the biopsied lymph node was benign; referred to weight loss study but declined; referred to [facility] 12/09/2000, but the timing of radiation and 8 was greater than 60 days past the date of diagnosis; referred to health disparity study, but declined; referred  to [id number] adjuvant therapy study, but the patient received Xeloda with radiation and therefore was ineligible   ASSESSMENT: 43 y.o. [place] woman status post right breast upper-outer quadrant biopsy 01/28/2016 for a clinical T2 N0 invasive ductal carcinoma, grade 3, triple negative, with an MIB-1 of 70%.  (a) suspicious right axillary lymph node biopsied 01/28/2016 was benign  (1) neoadjuvant chemotherapy: doxorubicin and cyclophosphamide in dose dense fashion 4 started 04/14/16, completed 05/26/2016, followed by paclitaxel and carboplatin weekly 12, Started 06/09/2016  (a) taxol discontinued after 7 doses because of neuropathy, last dose 07/21/2016  (2) genetics testing October 20, 2016 through the 32-gene Comprehensive Cancer Panel offered by [facility] Junius Roads, MD) (with MSH2 Exons 1-7 Inversion Analysis) found no deleterious mutations or VUSS  In APC, ATM, AXIN2, BARD1, BMPR1A, BRCA1, BRCA2, BRIP1, CDH1, CDK4, CDKN2A, CHEK2, EPCAM, FANCC, MLH1, MSH2, MSH6, MUTYH, NBN, PALB2, PMS2, POLD1, POLE, PTEN, RAD51C, RAD51D, SCG5/GREM1, SMAD4, STK11, TP53, VHL, and XRCC2.    (3) right mastectomy and sentinel lymph node sampling 09/06/2016 showed a residual ypT1c ypN0, invasive ductal carcinoma, grade 3, with negative margins. Repeat prognostic panel again triple negative   (  4) adjuvant radiation with capecitabine/Xeloda sensitization 11/15/16 - 01/12/17 Site/dose:   Right Chest Wall and axilla (4 field) treated to 45 Gy in 25 fractions, and then Boosted an additional 14.4 Gy in 8 fractions.  (5) tobacco abuse disorder: The patient quit smoking 04/04/2018  METASTATIC DISEASE:  July 2020 (6) nonspecific changes noted on chest CT 06/11/2019 were clarified by PET scan 06/19/2019 showing hypermetabolic disease in the right anterior chest wall, right internal mammary nodes, right and left axillary nodes, but no metastatic disease in the neck, lungs, abdomen or pelvis.  Bone marrow uptake  suggests bony metastatic disease.  (a) CARIS requested obtained from 06/28/2019 sample confirmed a triple negativity, the tumor also was negative for the androgen receptor, was MSI stable and mismatch repair status proficient, with a low mutational burden.  BRCA 1 and 2 were negative and PD-L1 was negative.  PI K3 showed a variant of uncertain significance.  However the tumor was genomic LOH high  (b) CA-27-29 is informative: was 118.3 on 06/04/2019  (7) zoledronate started 07/17/2019--on hold currently due to dental issues, and until 6 weeks at least after dental work  (8) carboplatin/ gemcitabine days 1 and 8 Q21 day cycle started 07/10/2019   (a) restaging studies 09/2019 showed no progression  (b) restaging studies after 6 cycles pending  PLAN: Torianne has tolerated the carboplatin and gemcitabine generally well, and has been able to maintain a good functional status.  However I think there is evidence of progression by physical exam today.  I not proceeding with treatment today.  Instead I am trying to get her CT scans of the chest abdomen and pelvis scheduled before her return visit here in 1 week.  At that time assuming we do document disease progression we will have to change her treatment.  We certainly have many options but most likely will go to [facility] and I will enter those orders so we can proceed to treatment next week assuming we do confirm progression and she is agreeable.  We reinforced pandemic precautions today  I spent approximately 30 minutes face to face with Camelle with more than 50% of that time spent in counseling and coordination of care.  She knows to call for any other issue that may develop before the next visit.  [name]. , MD 11/06/19 9:49 AM Medical Oncology and Hematology [facility] [place], [place] [address] Tel. [phone]    Fax. [phone]    I, Wilburn Mylar, am acting as scribe for Dr. [name]. .   I, Lurline Del MD, have reviewed the above documentation for accuracy and completeness, and I agree with the above.

## 2019-11-06 ENCOUNTER — Inpatient Hospital Stay: Payer: Medicaid Other | Attending: Oncology

## 2019-11-06 ENCOUNTER — Inpatient Hospital Stay: Payer: Medicaid Other

## 2019-11-06 ENCOUNTER — Inpatient Hospital Stay (HOSPITAL_BASED_OUTPATIENT_CLINIC_OR_DEPARTMENT_OTHER): Payer: Medicaid Other | Admitting: Oncology

## 2019-11-06 ENCOUNTER — Telehealth: Payer: Self-pay

## 2019-11-06 ENCOUNTER — Other Ambulatory Visit: Payer: Self-pay

## 2019-11-06 VITALS — BP 120/76 | HR 94 | Temp 99.1°F | Resp 18 | Ht 61.5 in | Wt 209.6 lb

## 2019-11-06 DIAGNOSIS — D573 Sickle-cell trait: Secondary | ICD-10-CM | POA: Diagnosis not present

## 2019-11-06 DIAGNOSIS — K219 Gastro-esophageal reflux disease without esophagitis: Secondary | ICD-10-CM | POA: Insufficient documentation

## 2019-11-06 DIAGNOSIS — C50919 Malignant neoplasm of unspecified site of unspecified female breast: Secondary | ICD-10-CM | POA: Diagnosis not present

## 2019-11-06 DIAGNOSIS — C50411 Malignant neoplasm of upper-outer quadrant of right female breast: Secondary | ICD-10-CM

## 2019-11-06 DIAGNOSIS — F418 Other specified anxiety disorders: Secondary | ICD-10-CM | POA: Diagnosis not present

## 2019-11-06 DIAGNOSIS — Z171 Estrogen receptor negative status [ER-]: Secondary | ICD-10-CM | POA: Diagnosis not present

## 2019-11-06 DIAGNOSIS — C7951 Secondary malignant neoplasm of bone: Secondary | ICD-10-CM

## 2019-11-06 DIAGNOSIS — C7989 Secondary malignant neoplasm of other specified sites: Secondary | ICD-10-CM | POA: Diagnosis not present

## 2019-11-06 DIAGNOSIS — T451X5A Adverse effect of antineoplastic and immunosuppressive drugs, initial encounter: Secondary | ICD-10-CM | POA: Diagnosis not present

## 2019-11-06 DIAGNOSIS — Z6841 Body Mass Index (BMI) 40.0 and over, adult: Secondary | ICD-10-CM | POA: Diagnosis not present

## 2019-11-06 DIAGNOSIS — Z79899 Other long term (current) drug therapy: Secondary | ICD-10-CM | POA: Insufficient documentation

## 2019-11-06 DIAGNOSIS — G620 Drug-induced polyneuropathy: Secondary | ICD-10-CM

## 2019-11-06 DIAGNOSIS — C773 Secondary and unspecified malignant neoplasm of axilla and upper limb lymph nodes: Secondary | ICD-10-CM | POA: Insufficient documentation

## 2019-11-06 DIAGNOSIS — Z87891 Personal history of nicotine dependence: Secondary | ICD-10-CM | POA: Diagnosis not present

## 2019-11-06 DIAGNOSIS — I10 Essential (primary) hypertension: Secondary | ICD-10-CM | POA: Insufficient documentation

## 2019-11-06 DIAGNOSIS — E669 Obesity, unspecified: Secondary | ICD-10-CM | POA: Insufficient documentation

## 2019-11-06 DIAGNOSIS — Z803 Family history of malignant neoplasm of breast: Secondary | ICD-10-CM | POA: Insufficient documentation

## 2019-11-06 DIAGNOSIS — K760 Fatty (change of) liver, not elsewhere classified: Secondary | ICD-10-CM | POA: Diagnosis not present

## 2019-11-06 DIAGNOSIS — Z5111 Encounter for antineoplastic chemotherapy: Secondary | ICD-10-CM | POA: Insufficient documentation

## 2019-11-06 DIAGNOSIS — Z7189 Other specified counseling: Secondary | ICD-10-CM

## 2019-11-06 LAB — CBC WITH DIFFERENTIAL/PLATELET
Abs Immature Granulocytes: 0.01 10*3/uL (ref 0.00–0.07)
Basophils Absolute: 0 10*3/uL (ref 0.0–0.1)
Basophils Relative: 0 %
Eosinophils Absolute: 0 10*3/uL (ref 0.0–0.5)
Eosinophils Relative: 0 %
HCT: 20 % — ABNORMAL LOW (ref 36.0–46.0)
Hemoglobin: 6.8 g/dL — CL (ref 12.0–15.0)
Immature Granulocytes: 0 %
Lymphocytes Relative: 45 %
Lymphs Abs: 1.8 10*3/uL (ref 0.7–4.0)
MCH: 31.5 pg (ref 26.0–34.0)
MCHC: 34 g/dL (ref 30.0–36.0)
MCV: 92.6 fL (ref 80.0–100.0)
Monocytes Absolute: 0.1 10*3/uL (ref 0.1–1.0)
Monocytes Relative: 2 %
Neutro Abs: 2.1 10*3/uL (ref 1.7–7.7)
Neutrophils Relative %: 53 %
Platelets: 14 10*3/uL — ABNORMAL LOW (ref 150–400)
RBC: 2.16 MIL/uL — ABNORMAL LOW (ref 3.87–5.11)
RDW: 18.7 % — ABNORMAL HIGH (ref 11.5–15.5)
WBC: 4 10*3/uL (ref 4.0–10.5)
nRBC: 0 % (ref 0.0–0.2)

## 2019-11-06 LAB — COMPREHENSIVE METABOLIC PANEL
ALT: 34 U/L (ref 0–44)
AST: 25 U/L (ref 15–41)
Albumin: 4.1 g/dL (ref 3.5–5.0)
Alkaline Phosphatase: 68 U/L (ref 38–126)
Anion gap: 11 (ref 5–15)
BUN: 10 mg/dL (ref 6–20)
CO2: 27 mmol/L (ref 22–32)
Calcium: 8.9 mg/dL (ref 8.9–10.3)
Chloride: 105 mmol/L (ref 98–111)
Creatinine, Ser: 0.82 mg/dL (ref 0.44–1.00)
GFR calc Af Amer: >60 mL/min (ref >60)
GFR calc non Af Amer: >60 mL/min (ref >60)
Glucose, Bld: 94 mg/dL (ref 70–99)
Potassium: 3.5 mmol/L (ref 3.5–5.1)
Sodium: 143 mmol/L (ref 135–145)
Total Bilirubin: 0.2 mg/dL — ABNORMAL LOW (ref 0.3–1.2)
Total Protein: 7.7 g/dL (ref 6.5–8.1)

## 2019-11-06 LAB — SAMPLE TO BLOOD BANK

## 2019-11-06 MED ORDER — TRAMADOL HCL 50 MG PO TABS: 50.0000 mg | ORAL_TABLET | Freq: Four times a day (QID) | ORAL | 0 refills | Status: DC | PRN

## 2019-11-06 MED ORDER — LORAZEPAM 0.5 MG PO TABS: 0.5000 mg | ORAL_TABLET | Freq: Every day | ORAL | 0 refills | Status: DC | PRN

## 2019-11-06 NOTE — Telephone Encounter (Signed)
MD notified of critical Hgb of 6.8, and platelet count of 14,000.  Per MD pt is asymptomatic, would like to re-check at her appointments next week.    RN notified patient, patient voiced understanding and agreement.  RN educated pt on thrombocytopenia precautions. No further needs.

## 2019-11-06 NOTE — Progress Notes (Signed)
DISCONTINUE OFF PATHWAY REGIMEN - Breast   [id number]:Gemcitabine + Carboplatin (1000/2) q21 Days:   A cycle is every 21 days:     Gemcitabine      Carboplatin   **Always confirm dose/schedule in your pharmacy ordering system**  REASON: Disease Progression PRIOR TREATMENT: Off Pathway: Gemcitabine + Carboplatin (1000/2) q21 Days TREATMENT RESPONSE: Progressive Disease (PD)  START OFF PATHWAY REGIMEN - Breast   [id number]:[facility] (IV cyclophosphamide) q21 days:   A cycle is every 21 days:     Cyclophosphamide      Methotrexate      Fluorouracil   **Always confirm dose/schedule in your pharmacy ordering system**  Patient Characteristics: Distant Metastases or Locoregional Recurrent Disease - Unresected or Locally Advanced Unresectable Disease Progressing after Neoadjuvant and Local Therapies, HER2 Negative/Unknown/Equivocal, ER Negative/Unknown, Chemotherapy, Second Line Therapeutic Status: Distant Metastases BRCA Mutation Status: Absent ER Status: Negative (-) HER2 Status: Negative (-) PR Status: Negative (-) Line of Therapy: Second Line Intent of Therapy: Non-Curative / Palliative Intent, Discussed with Patient

## 2019-11-07 ENCOUNTER — Other Ambulatory Visit: Payer: Self-pay | Admitting: *Deleted

## 2019-11-07 ENCOUNTER — Telehealth: Payer: Self-pay | Admitting: *Deleted

## 2019-11-07 DIAGNOSIS — D649 Anemia, unspecified: Secondary | ICD-10-CM

## 2019-11-07 NOTE — Telephone Encounter (Signed)
This RN spoke with pt per her call stating onset of headache and " not feeling good and I would like to go ahead and get the blood "  Noted heme of 6.8 yesterday with pt denying any symptoms.  She denies any active bleeding.  This RN obtained an appointment tomorrow afternoon for 1 unit of blood.

## 2019-11-08 ENCOUNTER — Inpatient Hospital Stay: Payer: Medicaid Other

## 2019-11-08 ENCOUNTER — Encounter: Payer: Self-pay | Admitting: General Practice

## 2019-11-08 ENCOUNTER — Other Ambulatory Visit: Payer: Self-pay | Admitting: *Deleted

## 2019-11-08 ENCOUNTER — Other Ambulatory Visit: Payer: Self-pay | Admitting: Oncology

## 2019-11-08 ENCOUNTER — Other Ambulatory Visit: Payer: Self-pay

## 2019-11-08 DIAGNOSIS — D649 Anemia, unspecified: Secondary | ICD-10-CM

## 2019-11-08 DIAGNOSIS — D696 Thrombocytopenia, unspecified: Secondary | ICD-10-CM

## 2019-11-08 DIAGNOSIS — C50411 Malignant neoplasm of upper-outer quadrant of right female breast: Secondary | ICD-10-CM

## 2019-11-08 DIAGNOSIS — Z5111 Encounter for antineoplastic chemotherapy: Secondary | ICD-10-CM | POA: Diagnosis not present

## 2019-11-08 DIAGNOSIS — C50919 Malignant neoplasm of unspecified site of unspecified female breast: Secondary | ICD-10-CM

## 2019-11-08 DIAGNOSIS — Z171 Estrogen receptor negative status [ER-]: Secondary | ICD-10-CM

## 2019-11-08 DIAGNOSIS — C7951 Secondary malignant neoplasm of bone: Secondary | ICD-10-CM

## 2019-11-08 LAB — CBC WITH DIFFERENTIAL (CANCER CENTER ONLY)
Abs Immature Granulocytes: 0.01 10*3/uL (ref 0.00–0.07)
Basophils Absolute: 0 10*3/uL (ref 0.0–0.1)
Basophils Relative: 0 %
Eosinophils Absolute: 0 10*3/uL (ref 0.0–0.5)
Eosinophils Relative: 0 %
HCT: 18.4 % — ABNORMAL LOW (ref 36.0–46.0)
Hemoglobin: 6.3 g/dL — CL (ref 12.0–15.0)
Immature Granulocytes: 0 %
Lymphocytes Relative: 50 %
Lymphs Abs: 1.9 10*3/uL (ref 0.7–4.0)
MCH: 31.7 pg (ref 26.0–34.0)
MCHC: 34.2 g/dL (ref 30.0–36.0)
MCV: 92.5 fL (ref 80.0–100.0)
Monocytes Absolute: 0.3 10*3/uL (ref 0.1–1.0)
Monocytes Relative: 8 %
Neutro Abs: 1.6 10*3/uL — ABNORMAL LOW (ref 1.7–7.7)
Neutrophils Relative %: 42 %
Platelet Count: 10 10*3/uL — ABNORMAL LOW (ref 150–400)
RBC: 1.99 MIL/uL — ABNORMAL LOW (ref 3.87–5.11)
RDW: 18.8 % — ABNORMAL HIGH (ref 11.5–15.5)
WBC Count: 3.7 10*3/uL — ABNORMAL LOW (ref 4.0–10.5)
nRBC: 0 % (ref 0.0–0.2)

## 2019-11-08 LAB — COMPREHENSIVE METABOLIC PANEL
ALT: 23 U/L (ref 0–44)
AST: 21 U/L (ref 15–41)
Albumin: 3.9 g/dL (ref 3.5–5.0)
Alkaline Phosphatase: 68 U/L (ref 38–126)
Anion gap: 8 (ref 5–15)
BUN: 9 mg/dL (ref 6–20)
CO2: 29 mmol/L (ref 22–32)
Calcium: 8.2 mg/dL — ABNORMAL LOW (ref 8.9–10.3)
Chloride: 105 mmol/L (ref 98–111)
Creatinine, Ser: 0.84 mg/dL (ref 0.44–1.00)
GFR calc Af Amer: >60 mL/min (ref >60)
GFR calc non Af Amer: >60 mL/min (ref >60)
Glucose, Bld: 103 mg/dL — ABNORMAL HIGH (ref 70–99)
Potassium: 3.2 mmol/L — ABNORMAL LOW (ref 3.5–5.1)
Sodium: 142 mmol/L (ref 135–145)
Total Bilirubin: <0.2 mg/dL — ABNORMAL LOW (ref 0.3–1.2)
Total Protein: 7.2 g/dL (ref 6.5–8.1)

## 2019-11-08 LAB — PREPARE RBC (CROSSMATCH)

## 2019-11-08 MED ORDER — SODIUM CHLORIDE 0.9% FLUSH
3.0000 mL | INTRAVENOUS | Status: DC | PRN
  Filled 2019-11-08: qty 10

## 2019-11-08 MED ORDER — DIPHENHYDRAMINE HCL 25 MG PO CAPS
25.0000 mg | ORAL_CAPSULE | Freq: Once | ORAL | Status: AC
  Administered 2019-11-08: 25 mg via ORAL

## 2019-11-08 MED ORDER — HEPARIN SOD (PORK) LOCK FLUSH 100 UNIT/ML IV SOLN
250.0000 [IU] | INTRAVENOUS | Status: DC | PRN
  Filled 2019-11-08: qty 5

## 2019-11-08 MED ORDER — ACETAMINOPHEN 325 MG PO TABS
ORAL_TABLET | ORAL | Status: AC
  Filled 2019-11-08: qty 2

## 2019-11-08 MED ORDER — ACETAMINOPHEN 325 MG PO TABS
650.0000 mg | ORAL_TABLET | Freq: Once | ORAL | Status: AC
  Administered 2019-11-08: 14:00:00 650 mg via ORAL

## 2019-11-08 MED ORDER — SODIUM CHLORIDE 0.9% FLUSH
10.0000 mL | INTRAVENOUS | Status: DC | PRN
  Filled 2019-11-08: qty 10

## 2019-11-08 MED ORDER — SODIUM CHLORIDE 0.9% IV SOLUTION
250.0000 mL | Freq: Once | INTRAVENOUS | Status: DC
  Filled 2019-11-08: qty 250

## 2019-11-08 MED ORDER — HEPARIN SOD (PORK) LOCK FLUSH 100 UNIT/ML IV SOLN
500.0000 [IU] | Freq: Every day | INTRAVENOUS | Status: AC | PRN
  Administered 2019-11-08: 500 [IU]
  Filled 2019-11-08: qty 5

## 2019-11-08 MED ORDER — SODIUM CHLORIDE 0.9% IV SOLUTION
250.0000 mL | Freq: Once | INTRAVENOUS | Status: AC
  Administered 2019-11-08: 14:00:00 250 mL via INTRAVENOUS
  Filled 2019-11-08: qty 250

## 2019-11-08 MED ORDER — SODIUM CHLORIDE 0.9% FLUSH
10.0000 mL | INTRAVENOUS | Status: AC | PRN
  Administered 2019-11-08: 10 mL
  Filled 2019-11-08: qty 10

## 2019-11-08 MED ORDER — DIPHENHYDRAMINE HCL 25 MG PO CAPS
ORAL_CAPSULE | ORAL | Status: AC
  Filled 2019-11-08: qty 1

## 2019-11-08 NOTE — Progress Notes (Signed)
[facility] CSW Progress Notes  Request received to call patient - spoke w patient while she was in infusion.  Patient states that she has received a disconnect notice from [facility], wants help w bills.  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  Will message Financial Advocate Rebecca [name] to assist w possible [employer] grant as patient has reocurrance of cancer.  Referred patient to [facility] Emergency Assistance Unit for possible help, sent referral to this agency.  Sent patient application for [employer] so she can review and assemble documents needed to apply.  Asked patient to recontact CSWs when she is ready to pursue [employer] application. States she has already applied to [employer].   Rebecca Shell, LCSW Clinical Social Worker Phone:  [phone] Cell:  [phone]

## 2019-11-08 NOTE — Patient Instructions (Signed)
Blood Transfusion, Adult, Care After This sheet gives you information about how to care for yourself after your procedure. Your doctor may also give you more specific instructions. If you have problems or questions, contact your doctor. Follow these instructions at home:   Take over-the-counter and prescription medicines only as told by your doctor.  Go back to your normal activities as told by your doctor.  Follow instructions from your doctor about how to take care of the area where an IV tube was put into your vein (insertion site). Make sure you: ? Wash your hands with soap and water before you change your bandage (dressing). If there is no soap and water, use hand sanitizer. ? Change your bandage as told by your doctor.  Check your IV insertion site every day for signs of infection. Check for: ? More redness, swelling, or pain. ? More fluid or blood. ? Warmth. ? Pus or a bad smell. Contact a doctor if:  You have more redness, swelling, or pain around the IV insertion site.  You have more fluid or blood coming from the IV insertion site.  Your IV insertion site feels warm to the touch.  You have pus or a bad smell coming from the IV insertion site.  Your pee (urine) turns pink, red, or brown.  You feel weak after doing your normal activities. Get help right away if:  You have signs of a serious allergic or body defense (immune) system reaction, including: ? Itchiness. ? Hives. ? Trouble breathing. ? Anxiety. ? Pain in your chest or lower back. ? Fever, flushing, and chills. ? Fast pulse. ? Rash. ? Watery poop (diarrhea). ? Throwing up (vomiting). ? Dark pee. ? Serious headache. ? Dizziness. ? Stiff neck. ? Yellow color in your face or the white parts of your eyes (jaundice). Summary  After a blood transfusion, return to your normal activities as told by your doctor.  Every day, check for signs of infection where the IV tube was put into your vein.  Some  signs of infection are warm skin, more redness and pain, more fluid or blood, and pus or a bad smell where the needle went in.  Contact your doctor if you feel weak or have any unusual symptoms. This information is not intended to replace advice given to you by your health care provider. Make sure you discuss any questions you have with your health care provider. Document Released: 12/12/2014 Document Revised: 03/28/2018 Document Reviewed: 07/15/2016 Elsevier Patient Education  [place].  Platelet Transfusion A platelet transfusion is a procedure in which you receive donated platelets through an IV. Platelets are tiny pieces of blood cells. When you get an injury, platelets clump together in the area to form a blood clot. This helps stop bleeding and is the beginning of the healing process. If you have too few platelets, your blood may have trouble clotting. This may cause you to bleed and bruise very easily. You may need a platelet transfusion if you have a condition that causes a low number of platelets (thrombocytopenia). A platelet transfusion may be used to stop or prevent excessive bleeding. Tell a health care provider about:  Any reactions you have had during previous transfusions.  Any allergies you have.  All medicines you are taking, including vitamins, herbs, eye drops, creams, and over-the-counter medicines.  Any blood disorders you have.  Any surgeries you have had.  Any medical conditions you have.  Whether you are pregnant or may be pregnant.  What are the risks? Generally, this is a safe procedure. However, problems may occur, including:  Fever.  Infection.  Allergic reaction to the donor platelets.  Your body's disease-fighting system (immune system) attacking the donor platelets (hemolytic reaction). This is rare.  A rare reaction that causes lung damage (transfusion-related acute lung injury). What happens before the procedure? Medicines  Ask your  health care provider about: ? Changing or stopping your regular medicines. This is especially important if you are taking diabetes medicines or blood thinners. ? Taking medicines such as aspirin and ibuprofen. These medicines can thin your blood. Do not take these medicines unless your health care provider tells you to take them. ? Taking over-the-counter medicines, vitamins, herbs, and supplements. General instructions  You will have a blood test to determine your blood type. Your blood type determines what kind of platelets you will be given.  Follow instructions from your health care provider about eating or drinking restrictions.  If you have had an allergic reaction to a transfusion in the past, you may be given medicine to help prevent a reaction.  Your temperature, blood pressure, pulse, and breathing will be monitored. What happens during the procedure?   An IV will be inserted into one of your veins.  For your safety, two health care providers will verify your identity along with the donor platelets about to be infused.  A bag of donor platelets will be connected to your IV. The platelets will flow into your bloodstream. This usually takes 30-60 minutes.  Your temperature, blood pressure, pulse, and breathing will be monitored during the transfusion. This helps detect early signs of any reaction.  You will also be monitored for other symptoms that may indicate a reaction, including chills, hives, or itching.  If you have signs of a reaction at any time, your transfusion will be stopped, and you may be given medicine to help manage the reaction.  When your transfusion is complete, your IV will be removed.  Pressure may be applied to the IV site for a few minutes to stop any bleeding.  The IV site will be covered with a bandage (dressing). The procedure may vary among health care providers and hospitals. What happens after the procedure?  Your blood pressure, temperature,  pulse, and breathing will be monitored until you leave the hospital or clinic.  You may have some bruising and soreness at your IV site. Follow these instructions at home: Medicines  Take over-the-counter and prescription medicines only as told by your health care provider.  Talk with your health care provider before you take any medicines that contain aspirin or NSAIDs. These medicines increase your risk for dangerous bleeding. General instructions  Change or remove your dressing as told by your health care provider.  Return to your normal activities as told by your health care provider. Ask your health care provider what activities are safe for you.  Do not take baths, swim, or use a hot tub until your health care provider approves. Ask your health care provider if you may take showers.  Check your IV site every day for signs of infection. Check for: ? Redness, swelling, or pain. ? Fluid or blood. If fluid or blood drains from your IV site, use your hands to press down firmly on a bandage covering the area for a minute or two. Doing this should stop the bleeding. ? Warmth. ? Pus or a bad smell.  Keep all follow-up visits as told by your health care provider.  This is important. Contact a health care provider if you have:  A headache that does not go away with medicine.  Hives, rash, or itchy skin.  Nausea or vomiting.  Unusual tiredness or weakness.  Signs of infection at your IV site. Get help right away if:  You have a fever or chills.  You urinate less often than usual.  Your urine is darker colored than normal.  You have any of the following: ? Trouble breathing. ? Pain in your back, abdomen, or chest. ? Cool, clammy skin. ? A fast heartbeat. Summary  Platelets are tiny pieces of blood cells that clump together to form a blood clot when you have an injury. If you have too few platelets, your blood may have trouble clotting.  A platelet transfusion is a  procedure in which you receive donated platelets through an IV.  A platelet transfusion may be used to stop or prevent excessive bleeding.  After the procedure, check your IV site every day for signs of infection, including redness, swelling, pain, or warmth. This information is not intended to replace advice given to you by your health care provider. Make sure you discuss any questions you have with your health care provider. Document Released: 09/18/2007 Document Revised: 12/27/2017 Document Reviewed: 12/27/2017 Elsevier Patient Education  2020 [employer].

## 2019-11-09 LAB — CANCER ANTIGEN 27.29: CA 27.29: 200.8 U/mL — ABNORMAL HIGH (ref 0.0–38.6)

## 2019-11-11 ENCOUNTER — Telehealth: Payer: Self-pay | Admitting: Adult Health

## 2019-11-11 LAB — TYPE AND SCREEN
ABO/RH(D): O POS
Antibody Screen: NEGATIVE
Unit division: 0

## 2019-11-11 LAB — BPAM RBC
Blood Product Expiration Date: 202101052359
ISSUE DATE / TIME: 202012041553
Unit Type and Rh: 5100

## 2019-11-11 LAB — BPAM PLATELET PHERESIS
Blood Product Expiration Date: 202012052359
ISSUE DATE / TIME: 202012041454
Unit Type and Rh: 2800

## 2019-11-11 LAB — PREPARE PLATELET PHERESIS: Unit division: 0

## 2019-11-11 NOTE — Telephone Encounter (Signed)
I talk with patient regarding schedule  

## 2019-11-12 NOTE — Progress Notes (Signed)
[facility]  [phone] [phone]   ID: Rebecca [name] DOB: [birth year]  MR#: [id number]  OIB#:[id number]  Patient Care Team: Dorena Dew, FNP as PCP - General (Family Medicine) Alphonsa Overall, MD as Consulting Physician (General Surgery) Ilaria Much, [name], MD as Consulting Physician (Oncology) Gery Pray, MD as Consulting Physician (Radiation Oncology) Delice Bison, Charlestine Massed, NP as Nurse Practitioner (Hematology and Oncology) Alda Berthold, DO as Consulting Physician (Neurology) OTHER MD:  CHIEF COMPLAINT: triple negative stage IV breast cancer  CURRENT TREATMENT: CMF chemotherapy; [zoledronate]   INTERVAL HISTORY: Rebecca [name] returns today for follow-up and treatment of he rtriple negative breast cancer.    She has completed 6 cycles of carboplatin and gemcitabine, with her last dose 10/30/2019.  We just restage her with CTs of the chest abdomen and pelvis.  The good news is that they still do not show any involvement of the lungs or liver, the bad news is that we do not see any evidence of response.  In fact there has been some growth in the measurable disease (a target lymph node for example went from 2.6 to 3.0 cm.  We have held the zoledronate until she gets some dental work done   REVIEW OF SYSTEMS: Rebecca [name] continues to do remarkably well.  She continues to work, mostly as a Actuary; she takes care of her family and household issues.  She denies unusual headaches visual changes cough phlegm production pleurisy shortness of breath or change in bowel or bladder habits.  She has low counts and particularly anemia but has tolerated transfusions well.  A detailed review of systems today was otherwise stable.   BREAST CANCER HISTORY: From the original intake note:  Rebecca [name] herself noted a change in her right breast sometime around September or October 2016. She did not bring it to intermediate medical attention, but on 01/13/2016 she established  herself in Dr. Smith Robert' service and she was set up for bilateral diagnostic mammography with tomosynthesis and bilateral ultrasonography at the [facility] 01/19/2016. The breast density was category B. In the upper outer quadrant of the right breast there was a spiculated mass measuring 2.8 cm. On physical exam this was palpable. Targeted ultrasonography confirmed an irregular hypoechoic mass in the right breast 11:30 o'clock position measuring 2.6 cm maximally. Ultrasound of the right axilla showed a morphologically abnormal lymph node.  In the left breast there were some tubular densities behind the areola which by ultrasonography showed benign ductal ectasia.  On 01/28/2016 Rebecca [name] underwent biopsy of the right breast mass and abnormal right axillary lymph node. The pathology from this procedure (S [facility] [id number]) showed the lymph node to be benign. In the breast however there was an invasive ductal carcinoma, grade 3, which was estrogen and progesterone receptor negative. The proliferation marker was 70%. HER-2 was not amplified with a signals ratio of 1.32. The number per cell was 2.05.  The patient's subsequent history is as detailed below   PAST MEDICAL HISTORY: Past Medical History:  Diagnosis Date   Anxiety    Breast cancer ([place])    Depression    GERD (gastroesophageal reflux disease)    History of radiation therapy 11/15/16-01/12/17   right chest wall and axilla treated to 45 Gy in 25 fractions, boosted and additional 14 Gy in 8 fractions   Hypertension    diet controlled   Obesity (BMI 35.0-39.9 without comorbidity)    Pneumonia    as a child   Seasonal allergies  Sickle cell trait ([place])    Termination of pregnancy (fetus) 04/02/16    PAST SURGICAL HISTORY: Past Surgical History:  Procedure Laterality Date   CESAREAN SECTION     2004 and 2007   MASTECTOMY W/ SENTINEL NODE BIOPSY Right 09/06/2016   Procedure: RIGHT BREAST MASTECTOMY WITH RIGHT AXILLARY SENTINEL  LYMPH NODE BIOPSY;  Surgeon: Alphonsa Overall, MD;  Location: [facility];  Service: General;  Laterality: Right;   PORT-A-CATH REMOVAL Left 09/06/2016   Procedure: REMOVAL PORT-A-CATH;  Surgeon: Alphonsa Overall, MD;  Location: [facility];  Service: General;  Laterality: Left;   PORTACATH PLACEMENT     PORTACATH PLACEMENT N/A 07/09/2019   Procedure: INSERTION PORT-A-CATH WITH ULTRASOUND;  Surgeon: Alphonsa Overall, MD;  Location: [facility];  Service: General;  Laterality: N/A;    FAMILY HISTORY Family History  Problem Relation Age of Onset   Hypertension Mother    Cancer Mother        dx "intestinal cancer" in her 45s; +surgery   Other Mother        hysterectomy at young age for unspecified cause   Heart Problems Mother    Breast cancer Cousin        maternal 1st cousin dx female breast cancer at 41-46y   Cancer Father    Hypertension Father    Heart Problems Maternal Aunt    Diabetes Maternal Aunt    Breast cancer Maternal Uncle        dx 64-65   Heart Problems Maternal Uncle    Breast cancer Maternal Grandmother 50   Throat cancer Maternal Grandfather        d. 74s; smoker   Sickle cell anemia Paternal Aunt    Congestive Heart Failure Maternal Aunt    Multiple sclerosis Cousin    Cancer Other        maternal great uncle (MGM's brother); cancer removed from his side   Heart attack Paternal Aunt        d. early 28s  The patient has very little information about her father. Her mother is currently 59 years old. She had a history of cervical cancer at age 73. The patient had 2 brothers, no sisters. The maternal grandfather had throat cancer. A maternal uncle was diagnosed with breast cancer as well as prostate cancer at the age of 55. 2 maternal cousins, one of the mail, had breast cancer as well.   GYNECOLOGIC HISTORY:  No LMP recorded. Menarche age 13, first live birth age 33. The patient is GX P4. She was still having regular periods at the time  of diagnosis. She took oral contraceptives in the 1990s with no side effects.--.  Stopped with chemotherapy and have not resumed as of May 2019   SOCIAL HISTORY:  (Updated 10/2018) She works as a Market researcher. The patient's significant other [name] works at [employer].  At home with the patient are her 3 children [name], [place] and [place]. There are age 10, 82, and 35 as of November 2019.  2 of them are disabled or have significant health problems, one with sickle cell disease, the other with autism and developmental delay.  The patient's son [name], currently 23 years old, lives in [place].    ADVANCED DIRECTIVES: Not in place   HEALTH MAINTENANCE: Social History   Socioeconomic History   Marital status: Single    Spouse name: Not on file   Number of children: 4  Years of education: Not on file   Highest education level: Not on file  Occupational History   Occupation: Private Care Attendant    Comment: First choice [place] resource strain: Not on file   Food insecurity    Worry: Never true    Inability: Never true   Transportation needs    Medical: No    Non-medical: No  Tobacco Use   Smoking status: Former Smoker    Packs/day: 1.00    Years: 20.00    Pack years: 20.00    Types: Cigarettes    Quit date: 04/01/2018    Years since quitting: 1.6   Smokeless tobacco: Never Used   Tobacco comment: Patient has quit smoking x 1 year now  Substance and Sexual Activity   Alcohol use: Yes    Comment: occ   Drug use: No   Sexual activity: Not on file  Lifestyle   Physical activity    Days per week: 7 days    Minutes per session: 60 min   Stress: Not at all  Relationships   Social connections    Talks on phone: Not on file    Gets together: Not on file    Attends religious service: Not on file    Active member of club or organization: Not on file    Attends meetings of  clubs or organizations: Not on file    Relationship status: Not on file  Other Topics Concern   Not on file  Social History Narrative   Not on file     Colonoscopy:  PAP:  Bone density:  Lipid panel:  No Known Allergies  Current Outpatient Medications on File Prior to Visit  Medication Sig Dispense Refill   calcium carbonate (TUMS - DOSED IN MG ELEMENTAL CALCIUM) 500 MG chewable tablet Chew 1 tablet by mouth daily as needed for indigestion or heartburn.     LORazepam (ATIVAN) 0.5 MG tablet Take 1 tablet (0.5 mg total) by mouth daily as needed for anxiety. 30 tablet 0   metroNIDAZOLE (FLAGYL) 500 MG tablet Take 1 tablet (500 mg total) by mouth 2 (two) times daily. Crush one tablet and apply to affected site two times a day with dressing changes 60 tablet 1   omeprazole (PRILOSEC) 20 MG capsule Take 1 capsule (20 mg total) by mouth daily. 30 capsule 1   traMADol (ULTRAM) 50 MG tablet Take 1 tablet (50 mg total) by mouth every 6 (six) hours as needed. 30 tablet 0   [DISCONTINUED] prochlorperazine (COMPAZINE) 10 MG tablet Take 1 tablet (10 mg total) by mouth every 6 (six) hours as needed (Nausea or vomiting). 30 tablet 1   No current facility-administered medications on file prior to visit.     OBJECTIVE: [name] who appears stated age  Vitals:   11/13/19 1114  BP: 128/74  Pulse: (!) 106  Resp: 18  Temp: 97.8 F (36.6 C)  SpO2: 100%   Wt Readings from Last 3 Encounters:  11/13/19 208 lb 6.4 oz (94.5 kg)  11/06/19 209 lb 9.6 oz (95.1 kg)  10/23/19 210 lb (95.3 kg)   Body mass index is 38.74 kg/m.    ECOG FS:1 - Symptomatic but completely ambulatory  Sclerae unicteric, EOMs intact Wearing a mask. No cervical or supraclavicular adenopathy Lungs no rales or rhonchi Heart regular rate and rhythm Abd soft, obese, nontender, positive bowel sounds MSK no focal spinal tenderness, no upper extremity lymphedema Neuro: nonfocal, well  oriented,  appropriate affect Breasts: Deferred  Right chest wall 11/06/2019    09/11/2019 on the left; 10/23/2019 on the right      LAB RESULTS: CMP Latest Ref Rng & Units 11/13/2019 11/08/2019 11/06/2019  Glucose 70 - 99 mg/dL 111(H) 103(H) 94  BUN 6 - 20 mg/dL 7 9 10   Creatinine 0.44 - 1.00 mg/dL 0.86 0.84 0.82  Sodium 135 - 145 mmol/L 142 142 143  Potassium 3.5 - 5.1 mmol/L 3.3(L) 3.2(L) 3.5  Chloride 98 - 111 mmol/L 105 105 105  CO2 22 - 32 mmol/L 29 29 27   Calcium 8.9 - 10.3 mg/dL 8.5(L) 8.2(L) 8.9  Total Protein 6.5 - 8.1 g/dL 7.6 7.2 7.7  Total Bilirubin 0.3 - 1.2 mg/dL 0.6 <0.2(L) 0.2(L)  Alkaline Phos 38 - 126 U/L 66 68 68  AST 15 - 41 U/L 44(H) 21 25  ALT 0 - 44 U/L 46(H) 23 34     CBC    Component Value Date/Time   WBC 4.8 11/13/2019 1100   RBC 2.55 (L) 11/13/2019 1100   HGB 7.8 (L) 11/13/2019 1100   HGB 6.3 (LL) 11/08/2019 1350   HGB 10.4 (L) 10/04/2016 1154   HCT 23.2 (L) 11/13/2019 1100   HCT 31.3 (L) 10/04/2016 1154   PLT 70 (L) 11/13/2019 1100   PLT 10 (L) 11/08/2019 1350   PLT 320 10/04/2016 1154   MCV 91.0 11/13/2019 1100   MCV 95.7 10/04/2016 1154   MCH 30.6 11/13/2019 1100   MCHC 33.6 11/13/2019 1100   RDW 19.8 (H) 11/13/2019 1100   RDW 16.3 (H) 10/04/2016 1154   LYMPHSABS 1.6 11/13/2019 1100   LYMPHSABS 1.7 10/04/2016 1154   MONOABS 0.8 11/13/2019 1100   MONOABS 0.5 10/04/2016 1154   EOSABS 0.0 11/13/2019 1100   EOSABS 0.3 10/04/2016 1154   BASOSABS 0.0 11/13/2019 1100   BASOSABS 0.0 10/04/2016 1154    STUDIES: Ct Chest W Contrast  Result Date: 11/13/2019 CLINICAL DATA:  Recurrent right breast cancer, status post mastectomy, ongoing chemotherapy EXAM: CT CHEST, ABDOMEN, AND PELVIS WITH CONTRAST TECHNIQUE: Multidetector CT imaging of the chest, abdomen and pelvis was performed following the standard protocol during bolus administration of intravenous contrast. CONTRAST:  133m OMNIPAQUE IOHEXOL 300 MG/ML SOLN, additional oral enteric contrast  COMPARISON:  CT chest abdomen pelvis, 09/05/2019, PET-CT, 06/19/2019, CT chest, 06/11/2019 FINDINGS: CT CHEST FINDINGS Cardiovascular: Left chest port catheter. Normal heart size. No pericardial effusion. Mediastinum/Nodes: Slight interval increase in size of left axillary and subpectoral lymph nodes, a dominant large left axillary lymph node, measuring 3.0 x 1.5 cm, previously 2.6 x 1.3 cm (series 2, image 11). Slight interval enlargement of subcentimeter right axillary lymph nodes, an index node measuring 6 mm, previously 3 mm (series 2, image 17). Stable subcentimeter right internal mammary nodes (series 2, image 16). Thymic remnant in the anterior mediastinum. Thyroid gland, trachea, and esophagus demonstrate no significant findings. Lungs/Pleura: Lungs are clear. No pleural effusion or pneumothorax. Musculoskeletal: Redemonstrated spiculated soft tissue mass of the right pectoralis major muscle, which is again difficult to accurately measure due to isoattenuating with adjacent muscle, although when guided by prior PET avidity measures approximately 3.1 x 2.4 cm, and does not appear significantly changed (series 2, image 17). There is a new adjacent solid soft tissue nodule overlying the anterior superior pectoralis major measuring 1.4 x 0.9 cm at the site of subtle FDG avidity and fat stranding seen on initial staging PET-CT (series 2, image 10). No significant change in more ill-defined  soft tissue nodules at the lateral margin of the mastectomy site which were FDG avid on prior examination (series 2, image 21). CT ABDOMEN PELVIS FINDINGS Hepatobiliary: No solid liver abnormality is seen. Hepatic steatosis. No gallstones, gallbladder wall thickening, or biliary dilatation. Pancreas: Unremarkable. No pancreatic ductal dilatation or surrounding inflammatory changes. Spleen: Normal in size without significant abnormality. Adrenals/Urinary Tract: Adrenal glands are unremarkable. Kidneys are normal, without renal  calculi, solid lesion, or hydronephrosis. Bladder is unremarkable. Stomach/Bowel: Stomach is within normal limits. Appendix appears normal. No evidence of bowel wall thickening, distention, or inflammatory changes. Vascular/Lymphatic: No significant vascular findings are present. No enlarged abdominal or pelvic lymph nodes. Reproductive: No mass or other abnormality. Other: No abdominal wall hernia or abnormality. No abdominopelvic ascites. Musculoskeletal: No acute or significant osseous findings. IMPRESSION: 1. Slight interval increase in size of left axillary and subpectoral lymph nodes, a dominant large left axillary lymph node, measuring 3.0 x 1.5 cm, previously 2.6 x 1.3 cm (series 2, image 11). Slight interval enlargement of subcentimeter right axillary lymph nodes, an index node measuring 6 mm, previously 3 mm (series 2, image 17). Findings are consistent with worsened nodal metastatic disease. 2. Redemonstrated spiculated soft tissue mass of the right pectoralis major muscle, which is again difficult to accurately measure due to isoattenuating with adjacent muscle, although when guided by prior PET avidity measures approximately 3.1 x 2.4 cm, and does not appear significantly changed (series 2, image 17). There is a new adjacent solid soft tissue nodule overlying the anterior superior pectoralis major measuring 1.4 x 0.9 cm at the site of subtle FDG avidity and fat stranding seen on initial staging PET-CT (series 2, image 10). Findings are consistent with worsened local disease although the dominant mass is unchanged. 3. No evidence of metastatic disease within the abdomen or pelvis. Electronically Signed   By: Eddie Candle M.D.   On: 11/13/2019 09:36   Ct Abdomen Pelvis W Contrast  Result Date: 11/13/2019 CLINICAL DATA:  Recurrent right breast cancer, status post mastectomy, ongoing chemotherapy EXAM: CT CHEST, ABDOMEN, AND PELVIS WITH CONTRAST TECHNIQUE: Multidetector CT imaging of the chest, abdomen  and pelvis was performed following the standard protocol during bolus administration of intravenous contrast. CONTRAST:  185m OMNIPAQUE IOHEXOL 300 MG/ML SOLN, additional oral enteric contrast COMPARISON:  CT chest abdomen pelvis, 09/05/2019, PET-CT, 06/19/2019, CT chest, 06/11/2019 FINDINGS: CT CHEST FINDINGS Cardiovascular: Left chest port catheter. Normal heart size. No pericardial effusion. Mediastinum/Nodes: Slight interval increase in size of left axillary and subpectoral lymph nodes, a dominant large left axillary lymph node, measuring 3.0 x 1.5 cm, previously 2.6 x 1.3 cm (series 2, image 11). Slight interval enlargement of subcentimeter right axillary lymph nodes, an index node measuring 6 mm, previously 3 mm (series 2, image 17). Stable subcentimeter right internal mammary nodes (series 2, image 16). Thymic remnant in the anterior mediastinum. Thyroid gland, trachea, and esophagus demonstrate no significant findings. Lungs/Pleura: Lungs are clear. No pleural effusion or pneumothorax. Musculoskeletal: Redemonstrated spiculated soft tissue mass of the right pectoralis major muscle, which is again difficult to accurately measure due to isoattenuating with adjacent muscle, although when guided by prior PET avidity measures approximately 3.1 x 2.4 cm, and does not appear significantly changed (series 2, image 17). There is a new adjacent solid soft tissue nodule overlying the anterior superior pectoralis major measuring 1.4 x 0.9 cm at the site of subtle FDG avidity and fat stranding seen on initial staging PET-CT (series 2, image 10). No significant change in  more ill-defined soft tissue nodules at the lateral margin of the mastectomy site which were FDG avid on prior examination (series 2, image 21). CT ABDOMEN PELVIS FINDINGS Hepatobiliary: No solid liver abnormality is seen. Hepatic steatosis. No gallstones, gallbladder wall thickening, or biliary dilatation. Pancreas: Unremarkable. No pancreatic ductal  dilatation or surrounding inflammatory changes. Spleen: Normal in size without significant abnormality. Adrenals/Urinary Tract: Adrenal glands are unremarkable. Kidneys are normal, without renal calculi, solid lesion, or hydronephrosis. Bladder is unremarkable. Stomach/Bowel: Stomach is within normal limits. Appendix appears normal. No evidence of bowel wall thickening, distention, or inflammatory changes. Vascular/Lymphatic: No significant vascular findings are present. No enlarged abdominal or pelvic lymph nodes. Reproductive: No mass or other abnormality. Other: No abdominal wall hernia or abnormality. No abdominopelvic ascites. Musculoskeletal: No acute or significant osseous findings. IMPRESSION: 1. Slight interval increase in size of left axillary and subpectoral lymph nodes, a dominant large left axillary lymph node, measuring 3.0 x 1.5 cm, previously 2.6 x 1.3 cm (series 2, image 11). Slight interval enlargement of subcentimeter right axillary lymph nodes, an index node measuring 6 mm, previously 3 mm (series 2, image 17). Findings are consistent with worsened nodal metastatic disease. 2. Redemonstrated spiculated soft tissue mass of the right pectoralis major muscle, which is again difficult to accurately measure due to isoattenuating with adjacent muscle, although when guided by prior PET avidity measures approximately 3.1 x 2.4 cm, and does not appear significantly changed (series 2, image 17). There is a new adjacent solid soft tissue nodule overlying the anterior superior pectoralis major measuring 1.4 x 0.9 cm at the site of subtle FDG avidity and fat stranding seen on initial staging PET-CT (series 2, image 10). Findings are consistent with worsened local disease although the dominant mass is unchanged. 3. No evidence of metastatic disease within the abdomen or pelvis. Electronically Signed   By: Eddie Candle M.D.   On: 11/13/2019 09:36    RESEARCH: Referred to PREVENT study, but was pregnant at  the time; referred to [facility] 11202, but the biopsied lymph node was benign; referred to weight loss study but declined; referred to [facility] 12/09/2000, but the timing of radiation and 8 was greater than 60 days past the date of diagnosis; referred to health disparity study, but declined; referred to [id number] adjuvant therapy study, but the patient received Xeloda with radiation and therefore was ineligible   ASSESSMENT: 43 y.o. Rebecca [name] woman status post right breast upper-outer quadrant biopsy 01/28/2016 for a clinical T2 N0 invasive ductal carcinoma, grade 3, triple negative, with an MIB-1 of 70%.  (a) suspicious right axillary lymph node biopsied 01/28/2016 was benign  (1) neoadjuvant chemotherapy: doxorubicin and cyclophosphamide in dose dense fashion 4 started 04/14/16, completed 05/26/2016, followed by paclitaxel and carboplatin weekly 12, Started 06/09/2016  (a) taxol discontinued after 7 doses because of neuropathy, last dose 07/21/2016  (2) genetics testing October 20, 2016 through the 32-gene Comprehensive Cancer Panel offered by [facility] Junius Roads, MD) (with MSH2 Exons 1-7 Inversion Analysis) found no deleterious mutations or VUSS  In APC, ATM, AXIN2, BARD1, BMPR1A, BRCA1, BRCA2, BRIP1, CDH1, CDK4, CDKN2A, CHEK2, EPCAM, FANCC, MLH1, MSH2, MSH6, MUTYH, NBN, PALB2, PMS2, POLD1, POLE, PTEN, RAD51C, RAD51D, SCG5/GREM1, SMAD4, STK11, TP53, VHL, and XRCC2.    (3) right mastectomy and sentinel lymph node sampling 09/06/2016 showed a residual ypT1c ypN0, invasive ductal carcinoma, grade 3, with negative margins. Repeat prognostic panel again triple negative   (4) adjuvant radiation with capecitabine/Xeloda sensitization 11/15/16 - 01/12/17 Site/dose:  Right Chest Wall and axilla (4 field) treated to 45 Gy in 25 fractions, and then Boosted an additional 14.4 Gy in 8 fractions.  (5) tobacco abuse disorder: The patient quit smoking 04/04/2018  METASTATIC DISEASE:  July  2020 (6) nonspecific changes noted on chest CT 06/11/2019 were clarified by PET scan 06/19/2019 showing hypermetabolic disease in the right anterior chest wall, right internal mammary nodes, right and left axillary nodes, but no metastatic disease in the neck, lungs, abdomen or pelvis.  Bone marrow uptake suggests bony metastatic disease.  (a) CARIS requested obtained from 06/28/2019 sample confirmed a triple negativity, the tumor also was negative for the androgen receptor, was MSI stable and mismatch repair status proficient, with a low mutational burden.  BRCA 1 and 2 were negative and PD-L1 was negative.  PI K3 showed a variant of uncertain significance.  However the tumor was genomic LOH high  (b) CA-27-29 is informative: was 118.3 on 06/04/2019  (7) zoledronate started 07/17/2019--on hold currently due to dental issues, and until 6 weeks at least after dental work  (8) carboplatin/ gemcitabine days 1 and 8 Q21 day cycle started 07/10/2019, last dose 10/30/2019  (a) restaging studies 09/2019 showed no progression  (b) restaging studies after 6 cycles showed mild disease progression  (9) cyclophosphamide, methotrexate, fluorouracil chemotherapy starting 11/13/2019, to be repeated every 21 days.  PLAN: Rebecca [name] tolerated her prior chemo well but it did not really work for her.  We can accept stable disease but not tumor growth.  Accordingly we are changing chemotherapy.  Today we discussed the possible toxicities side effects and complications of CMF.  Generally this is tolerated well.  I did ask her to take prochlorperazine tonight and tomorrow morning and then as needed (she has been using no nausea for the carboplatin/gemcitabine, with no nausea symptoms to date).  Very likely she will need transfusion soon.  I am going to see her in about a week to make sure she tolerated CMF well and to check her nadir counts.  We will obtain a type and screen at the same time and transfuse if necessary  The  plan is to do 4 cycles of CMF and then restage  Once she gets her dental problems taken care of we will resume the zoledronate  She knows to call for any other issue that may develop before the next visit.  [name]. Colie Josten, MD 11/13/19 12:10 PM Medical Oncology and Hematology [facility] [place], [place] [address] Tel. [phone]    Fax. [phone]

## 2019-11-13 ENCOUNTER — Other Ambulatory Visit: Payer: Self-pay | Admitting: Oncology

## 2019-11-13 ENCOUNTER — Inpatient Hospital Stay (HOSPITAL_BASED_OUTPATIENT_CLINIC_OR_DEPARTMENT_OTHER): Payer: Medicaid Other | Admitting: Oncology

## 2019-11-13 ENCOUNTER — Inpatient Hospital Stay: Payer: Medicaid Other

## 2019-11-13 ENCOUNTER — Ambulatory Visit (HOSPITAL_COMMUNITY)
Admission: RE | Admit: 2019-11-13 | Discharge: 2019-11-13 | Disposition: A | Discharge status: Home / Self Care | Payer: Medicaid Other | Source: Ambulatory Visit | Attending: Adult Health | Admitting: Adult Health

## 2019-11-13 ENCOUNTER — Encounter (HOSPITAL_COMMUNITY)
Admission: RE | Admit: 2019-11-13 | Discharge: 2019-11-13 | Disposition: A | Discharge status: Home / Self Care | Payer: Medicaid Other | Source: Ambulatory Visit | Attending: Adult Health | Admitting: Adult Health

## 2019-11-13 ENCOUNTER — Other Ambulatory Visit: Payer: Self-pay

## 2019-11-13 ENCOUNTER — Ambulatory Visit (HOSPITAL_COMMUNITY): Payer: Medicaid Other

## 2019-11-13 VITALS — BP 128/74 | HR 106 | Temp 97.8°F | Resp 18 | Ht 61.5 in | Wt 208.4 lb

## 2019-11-13 VITALS — HR 97

## 2019-11-13 DIAGNOSIS — Z171 Estrogen receptor negative status [ER-]: Secondary | ICD-10-CM

## 2019-11-13 DIAGNOSIS — C50411 Malignant neoplasm of upper-outer quadrant of right female breast: Secondary | ICD-10-CM

## 2019-11-13 DIAGNOSIS — C50919 Malignant neoplasm of unspecified site of unspecified female breast: Secondary | ICD-10-CM

## 2019-11-13 DIAGNOSIS — C7951 Secondary malignant neoplasm of bone: Secondary | ICD-10-CM

## 2019-11-13 DIAGNOSIS — Z5111 Encounter for antineoplastic chemotherapy: Secondary | ICD-10-CM | POA: Diagnosis not present

## 2019-11-13 DIAGNOSIS — C50911 Malignant neoplasm of unspecified site of right female breast: Secondary | ICD-10-CM | POA: Diagnosis not present

## 2019-11-13 LAB — CBC WITH DIFFERENTIAL/PLATELET
Abs Immature Granulocytes: 0.02 10*3/uL (ref 0.00–0.07)
Basophils Absolute: 0 10*3/uL (ref 0.0–0.1)
Basophils Relative: 0 %
Eosinophils Absolute: 0 10*3/uL (ref 0.0–0.5)
Eosinophils Relative: 0 %
HCT: 23.2 % — ABNORMAL LOW (ref 36.0–46.0)
Hemoglobin: 7.8 g/dL — ABNORMAL LOW (ref 12.0–15.0)
Immature Granulocytes: 0 %
Lymphocytes Relative: 32 %
Lymphs Abs: 1.6 10*3/uL (ref 0.7–4.0)
MCH: 30.6 pg (ref 26.0–34.0)
MCHC: 33.6 g/dL (ref 30.0–36.0)
MCV: 91 fL (ref 80.0–100.0)
Monocytes Absolute: 0.8 10*3/uL (ref 0.1–1.0)
Monocytes Relative: 16 %
Neutro Abs: 2.4 10*3/uL (ref 1.7–7.7)
Neutrophils Relative %: 52 %
Platelets: 70 10*3/uL — ABNORMAL LOW (ref 150–400)
RBC: 2.55 MIL/uL — ABNORMAL LOW (ref 3.87–5.11)
RDW: 19.8 % — ABNORMAL HIGH (ref 11.5–15.5)
WBC: 4.8 10*3/uL (ref 4.0–10.5)
nRBC: 1 % — ABNORMAL HIGH (ref 0.0–0.2)

## 2019-11-13 LAB — COMPREHENSIVE METABOLIC PANEL
ALT: 46 U/L — ABNORMAL HIGH (ref 0–44)
AST: 44 U/L — ABNORMAL HIGH (ref 15–41)
Albumin: 4.1 g/dL (ref 3.5–5.0)
Alkaline Phosphatase: 66 U/L (ref 38–126)
Anion gap: 8 (ref 5–15)
BUN: 7 mg/dL (ref 6–20)
CO2: 29 mmol/L (ref 22–32)
Calcium: 8.5 mg/dL — ABNORMAL LOW (ref 8.9–10.3)
Chloride: 105 mmol/L (ref 98–111)
Creatinine, Ser: 0.86 mg/dL (ref 0.44–1.00)
GFR calc Af Amer: >60 mL/min (ref >60)
GFR calc non Af Amer: >60 mL/min (ref >60)
Glucose, Bld: 111 mg/dL — ABNORMAL HIGH (ref 70–99)
Potassium: 3.3 mmol/L — ABNORMAL LOW (ref 3.5–5.1)
Sodium: 142 mmol/L (ref 135–145)
Total Bilirubin: 0.6 mg/dL (ref 0.3–1.2)
Total Protein: 7.6 g/dL (ref 6.5–8.1)

## 2019-11-13 LAB — SAMPLE TO BLOOD BANK

## 2019-11-13 MED ORDER — FLUOROURACIL CHEMO INJECTION 2.5 GM/50ML
600.0000 mg/m2 | Freq: Once | INTRAVENOUS | Status: AC
  Administered 2019-11-13: 15:00:00 1200 mg via INTRAVENOUS
  Filled 2019-11-13: qty 24

## 2019-11-13 MED ORDER — PALONOSETRON HCL INJECTION 0.25 MG/5ML
INTRAVENOUS | Status: AC
  Filled 2019-11-13: qty 5

## 2019-11-13 MED ORDER — DEXAMETHASONE SODIUM PHOSPHATE 10 MG/ML IJ SOLN
10.0000 mg | Freq: Once | INTRAMUSCULAR | Status: AC
  Administered 2019-11-13: 13:00:00 10 mg via INTRAVENOUS

## 2019-11-13 MED ORDER — SODIUM CHLORIDE 0.9 % IV SOLN: 10.0000 mg | Freq: Once | INTRAVENOUS | Status: DC

## 2019-11-13 MED ORDER — IOHEXOL 300 MG/ML  SOLN
100.0000 mL | Freq: Once | INTRAMUSCULAR | Status: AC | PRN
  Administered 2019-11-13: 100 mL via INTRAVENOUS

## 2019-11-13 MED ORDER — DEXAMETHASONE SODIUM PHOSPHATE 10 MG/ML IJ SOLN
INTRAMUSCULAR | Status: AC
  Filled 2019-11-13: qty 1

## 2019-11-13 MED ORDER — METHOTREXATE SODIUM (PF) CHEMO INJECTION 250 MG/10ML
39.3500 mg/m2 | Freq: Once | INTRAMUSCULAR | Status: AC
  Administered 2019-11-13: 80 mg via INTRAVENOUS
  Filled 2019-11-13: qty 3.2

## 2019-11-13 MED ORDER — SODIUM CHLORIDE 0.9 % IV SOLN
600.0000 mg/m2 | Freq: Once | INTRAVENOUS | Status: AC
  Administered 2019-11-13: 14:00:00 1220 mg via INTRAVENOUS
  Filled 2019-11-13: qty 61

## 2019-11-13 MED ORDER — SODIUM CHLORIDE 0.9 % IV SOLN
Freq: Once | INTRAVENOUS | Status: AC
  Administered 2019-11-13: 12:00:00 via INTRAVENOUS
  Filled 2019-11-13: qty 250

## 2019-11-13 MED ORDER — HEPARIN SOD (PORK) LOCK FLUSH 100 UNIT/ML IV SOLN
500.0000 [IU] | Freq: Once | INTRAVENOUS | Status: AC | PRN
  Administered 2019-11-13: 500 [IU]
  Filled 2019-11-13: qty 5

## 2019-11-13 MED ORDER — TECHNETIUM TC 99M MEDRONATE IV KIT
21.7000 | PACK | Freq: Once | INTRAVENOUS | Status: AC | PRN
  Administered 2019-11-13: 08:00:00 21.7 via INTRAVENOUS

## 2019-11-13 MED ORDER — SODIUM CHLORIDE 0.9% FLUSH
10.0000 mL | INTRAVENOUS | Status: DC | PRN
  Administered 2019-11-13: 11:00:00 10 mL
  Filled 2019-11-13: qty 10

## 2019-11-13 MED ORDER — PALONOSETRON HCL INJECTION 0.25 MG/5ML
0.2500 mg | Freq: Once | INTRAVENOUS | Status: AC
  Administered 2019-11-13: 13:00:00 0.25 mg via INTRAVENOUS

## 2019-11-13 MED ORDER — SODIUM CHLORIDE (PF) 0.9 % IJ SOLN
INTRAMUSCULAR | Status: AC
  Filled 2019-11-13: qty 50

## 2019-11-13 MED ORDER — SODIUM CHLORIDE 0.9% FLUSH
10.0000 mL | INTRAVENOUS | Status: DC | PRN
  Administered 2019-11-13: 10 mL
  Filled 2019-11-13: qty 10

## 2019-11-13 NOTE — Patient Instructions (Signed)

## 2019-11-13 NOTE — Progress Notes (Signed)
Okay to treat D1C1 CMF with plt 70 and hgb 7.8 per Dr. [name].

## 2019-11-13 NOTE — Patient Instructions (Signed)
[facility] Discharge Instructions for Patients Receiving Chemotherapy  Today you received the following chemotherapy agents: Cytoxan, methotrexate, 5FU  To help prevent nausea and vomiting after your treatment, we encourage you to take your nausea medication as directed.   If you develop nausea and vomiting that is not controlled by your nausea medication, call the clinic.   BELOW ARE SYMPTOMS THAT SHOULD BE REPORTED IMMEDIATELY:  *FEVER GREATER THAN 100.5 F  *CHILLS WITH OR WITHOUT FEVER  NAUSEA AND VOMITING THAT IS NOT CONTROLLED WITH YOUR NAUSEA MEDICATION  *UNUSUAL SHORTNESS OF BREATH  *UNUSUAL BRUISING OR BLEEDING  TENDERNESS IN MOUTH AND THROAT WITH OR WITHOUT PRESENCE OF ULCERS  *URINARY PROBLEMS  *BOWEL PROBLEMS  UNUSUAL RASH Items with * indicate a potential emergency and should be followed up as soon as possible.  Feel free to call the clinic should you have any questions or concerns. The clinic phone number is [phone].  Please show the [facility] at check-in to the Emergency Department and triage nurse.  Cyclophosphamide injection What is this medicine? CYCLOPHOSPHAMIDE (sye kloe FOSS fa mide) is a chemotherapy drug. It slows the growth of cancer cells. This medicine is used to treat many types of cancer like lymphoma, myeloma, leukemia, breast cancer, and ovarian cancer, to name a few. This medicine may be used for other purposes; ask your health care provider or pharmacist if you have questions. COMMON BRAND NAME(S): Cytoxan, Neosar What should I tell my health care provider before I take this medicine? They need to know if you have any of these conditions:  blood disorders  history of other chemotherapy  infection  kidney disease  liver disease  recent or ongoing radiation therapy  tumors in the bone marrow  an unusual or allergic reaction to cyclophosphamide, other chemotherapy, other medicines, foods, dyes, or  preservatives  pregnant or trying to get pregnant  breast-feeding How should I use this medicine? This drug is usually given as an injection into a vein or muscle or by infusion into a vein. It is administered in a hospital or clinic by a specially trained health care professional. Talk to your pediatrician regarding the use of this medicine in children. Special care may be needed. Overdosage: If you think you have taken too much of this medicine contact a poison control center or emergency room at once. NOTE: This medicine is only for you. Do not share this medicine with others. What if I miss a dose? It is important not to miss your dose. Call your doctor or health care professional if you are unable to keep an appointment. What may interact with this medicine? This medicine may interact with the following medications:  amiodarone  amphotericin B  azathioprine  certain antiviral medicines for HIV or AIDS such as protease inhibitors (e.g., indinavir, ritonavir) and zidovudine  certain blood pressure medications such as benazepril, captopril, enalapril, fosinopril, lisinopril, moexipril, monopril, perindopril, quinapril, ramipril, trandolapril  certain cancer medications such as anthracyclines (e.g., daunorubicin, doxorubicin), busulfan, cytarabine, paclitaxel, pentostatin, tamoxifen, trastuzumab  certain diuretics such as chlorothiazide, chlorthalidone, hydrochlorothiazide, indapamide, metolazone  certain medicines that treat or prevent blood clots like warfarin  certain muscle relaxants such as succinylcholine  cyclosporine  etanercept  indomethacin  medicines to increase blood counts like filgrastim, pegfilgrastim, sargramostim  medicines used as general anesthesia  metronidazole  natalizumab This list may not describe all possible interactions. Give your health care provider a list of all the medicines, herbs, non-prescription drugs, or dietary supplements  you use.  Also tell them if you smoke, drink alcohol, or use illegal drugs. Some items may interact with your medicine. What should I watch for while using this medicine? Visit your doctor for checks on your progress. This drug may make you feel generally unwell. This is not uncommon, as chemotherapy can affect healthy cells as well as cancer cells. Report any side effects. Continue your course of treatment even though you feel ill unless your doctor tells you to stop. Drink water or other fluids as directed. Urinate often, even at night. In some cases, you may be given additional medicines to help with side effects. Follow all directions for their use. Call your doctor or health care professional for advice if you get a fever, chills or sore throat, or other symptoms of a cold or flu. Do not treat yourself. This drug decreases your body's ability to fight infections. Try to avoid being around people who are sick. This medicine may increase your risk to bruise or bleed. Call your doctor or health care professional if you notice any unusual bleeding. Be careful brushing and flossing your teeth or using a toothpick because you may get an infection or bleed more easily. If you have any dental work done, tell your dentist you are receiving this medicine. You may get drowsy or dizzy. Do not drive, use machinery, or do anything that needs mental alertness until you know how this medicine affects you. Do not become pregnant while taking this medicine or for 1 year after stopping it. Women should inform their doctor if they wish to become pregnant or think they might be pregnant. Men should not father a child while taking this medicine and for 4 months after stopping it. There is a potential for serious side effects to an unborn child. Talk to your health care professional or pharmacist for more information. Do not breast-feed an infant while taking this medicine. This medicine may interfere with the ability to have a  child. This medicine has caused ovarian failure in some women. This medicine has caused reduced sperm counts in some men. You should talk with your doctor or health care professional if you are concerned about your fertility. If you are going to have surgery, tell your doctor or health care professional that you have taken this medicine. What side effects may I notice from receiving this medicine? Side effects that you should report to your doctor or health care professional as soon as possible:  allergic reactions like skin rash, itching or hives, swelling of the face, lips, or tongue  low blood counts - this medicine may decrease the number of white blood cells, red blood cells and platelets. You may be at increased risk for infections and bleeding.  signs of infection - fever or chills, cough, sore throat, pain or difficulty passing urine  signs of decreased platelets or bleeding - bruising, pinpoint red spots on the skin, black, tarry stools, blood in the urine  signs of decreased red blood cells - unusually weak or tired, fainting spells, lightheadedness  breathing problems  dark urine  dizziness  palpitations  swelling of the ankles, feet, hands  trouble passing urine or change in the amount of urine  weight gain  yellowing of the eyes or skin Side effects that usually do not require medical attention (report to your doctor or health care professional if they continue or are bothersome):  changes in nail or skin color  hair loss  missed menstrual periods  mouth  sores  nausea, vomiting This list may not describe all possible side effects. Call your doctor for medical advice about side effects. You may report side effects to FDA at 1-800-FDA-1088. Where should I keep my medicine? This drug is given in a hospital or clinic and will not be stored at home. NOTE: This sheet is a summary. It may not cover all possible information. If you have questions about this medicine,  talk to your doctor, pharmacist, or health care provider.  2020 Elsevier/Gold Standard (2012-10-05 16:22:58)  Methotrexate injection What is this medicine? METHOTREXATE (METH oh TREX ate) is a chemotherapy drug used to treat cancer including breast cancer, leukemia, and lymphoma. This medicine can also be used to treat psoriasis and certain kinds of arthritis. This medicine may be used for other purposes; ask your health care provider or pharmacist if you have questions. What should I tell my health care provider before I take this medicine? They need to know if you have any of these conditions:  fluid in the stomach area or lungs  if you often drink alcohol  infection or immune system problems  kidney disease  liver disease  low blood counts, like low white cell, platelet, or red cell counts  lung disease  radiation therapy  stomach ulcers  ulcerative colitis  an unusual or allergic reaction to methotrexate, other medicines, foods, dyes, or preservatives  pregnant or trying to get pregnant  breast-feeding How should I use this medicine? This medicine is for infusion into a vein or for injection into muscle or into the spinal fluid (whichever applies). It is usually given by a health care professional in a hospital or clinic setting. In rare cases, you might get this medicine at home. You will be taught how to give this medicine. Use exactly as directed. Take your medicine at regular intervals. Do not take your medicine more often than directed. If this medicine is used for arthritis or psoriasis, it should be taken weekly, NOT daily. It is important that you put your used needles and syringes in a special sharps container. Do not put them in a trash can. If you do not have a sharps container, call your pharmacist or healthcare provider to get one. Talk to your pediatrician regarding the use of this medicine in children. While this drug may be prescribed for children as young  as 2 years for selected conditions, precautions do apply. Overdosage: If you think you have taken too much of this medicine contact a poison control center or emergency room at once. NOTE: This medicine is only for you. Do not share this medicine with others. What if I miss a dose? It is important not to miss your dose. Call your doctor or health care professional if you are unable to keep an appointment. If you give yourself the medicine and you miss a dose, talk with your doctor or health care professional. Do not take double or extra doses. What may interact with this medicine? This medicine may interact with the following medications:  acitretin  aspirin or aspirin-like medicines including salicylates  azathioprine  certain antibiotics like chloramphenicol, penicillin, tetracycline  certain medicines for stomach problems like esomeprazole, omeprazole, pantoprazole  cyclosporine  gold  hydroxychloroquine  live virus vaccines  mercaptopurine  NSAIDs, medicines for pain and inflammation, like ibuprofen or naproxen  other cytotoxic agents  penicillamine  phenylbutazone  phenytoin  probenacid  retinoids such as isotretinoin and tretinoin  steroid medicines like prednisone or cortisone  sulfonamides like sulfasalazine  and trimethoprim/sulfamethoxazole  theophylline This list may not describe all possible interactions. Give your health care provider a list of all the medicines, herbs, non-prescription drugs, or dietary supplements you use. Also tell them if you smoke, drink alcohol, or use illegal drugs. Some items may interact with your medicine. What should I watch for while using this medicine? Avoid alcoholic drinks. In some cases, you may be given additional medicines to help with side effects. Follow all directions for their use. This medicine can make you more sensitive to the sun. Keep out of the sun. If you cannot avoid being in the sun, wear protective  clothing and use sunscreen. Do not use sun lamps or tanning beds/booths. You may get drowsy or dizzy. Do not drive, use machinery, or do anything that needs mental alertness until you know how this medicine affects you. Do not stand or sit up quickly, especially if you are an older patient. This reduces the risk of dizzy or fainting spells. You may need blood work done while you are taking this medicine. Call your doctor or health care professional for advice if you get a fever, chills or sore throat, or other symptoms of a cold or flu. Do not treat yourself. This drug decreases your body's ability to fight infections. Try to avoid being around people who are sick. This medicine may increase your risk to bruise or bleed. Call your doctor or health care professional if you notice any unusual bleeding. Check with your doctor or health care professional if you get an attack of severe diarrhea, nausea and vomiting, or if you sweat a lot. The loss of too much body fluid can make it dangerous for you to take this medicine. Talk to your doctor about your risk of cancer. You may be more at risk for certain types of cancers if you take this medicine. Both men and women must use effective birth control with this medicine. Do not become pregnant while taking this medicine or until at least 1 normal menstrual cycle has occurred after stopping it. Women should inform their doctor if they wish to become pregnant or think they might be pregnant. Men should not father a child while taking this medicine and for 3 months after stopping it. There is a potential for serious side effects to an unborn child. Talk to your health care professional or pharmacist for more information. Do not breast-feed an infant while taking this medicine. What side effects may I notice from receiving this medicine? Side effects that you should report to your doctor or health care professional as soon as possible:  allergic reactions like skin  rash, itching or hives, swelling of the face, lips, or tongue  back pain  breathing problems or shortness of breath  confusion  diarrhea  dry, nonproductive cough  low blood counts - this medicine may decrease the number of white blood cells, red blood cells and platelets. You may be at increased risk of infections and bleeding  mouth sores  redness, blistering, peeling or loosening of the skin, including inside the mouth  seizures  severe headaches  signs of infection - fever or chills, cough, sore throat, pain or difficulty passing urine  signs and symptoms of bleeding such as bloody or black, tarry stools; red or dark-brown urine; spitting up blood or brown material that looks like coffee grounds; red spots on the skin; unusual bruising or bleeding from the eye, gums, or nose  signs and symptoms of kidney injury like trouble passing  urine or change in the amount of urine  signs and symptoms of liver injury like dark yellow or brown urine; general ill feeling or flu-like symptoms; light-colored stools; loss of appetite; nausea; right upper belly pain; unusually weak or tired; yellowing of the eyes or skin  stiff neck  vomiting Side effects that usually do not require medical attention (report to your doctor or health care professional if they continue or are bothersome):  dizziness  hair loss  headache  stomach pain  upset stomach This list may not describe all possible side effects. Call your doctor for medical advice about side effects. You may report side effects to FDA at 1-800-FDA-1088. Where should I keep my medicine? If you are using this medicine at home, you will be instructed on how to store this medicine. Throw away any unused medicine after the expiration date on the label. NOTE: This sheet is a summary. It may not cover all possible information. If you have questions about this medicine, talk to your doctor, pharmacist, or health care provider.  2020  Elsevier/Gold Standard (2017-07-13 13:31:42)  Fluorouracil, 5-FU injection What is this medicine? FLUOROURACIL, 5-FU (flure oh YOOR a sil) is a chemotherapy drug. It slows the growth of cancer cells. This medicine is used to treat many types of cancer like breast cancer, colon or rectal cancer, pancreatic cancer, and stomach cancer. This medicine may be used for other purposes; ask your health care provider or pharmacist if you have questions. COMMON BRAND NAME(S): Adrucil What should I tell my health care provider before I take this medicine? They need to know if you have any of these conditions:  blood disorders  dihydropyrimidine dehydrogenase (DPD) deficiency  infection (especially a virus infection such as chickenpox, cold sores, or herpes)  kidney disease  liver disease  malnourished, poor nutrition  recent or ongoing radiation therapy  an unusual or allergic reaction to fluorouracil, other chemotherapy, other medicines, foods, dyes, or preservatives  pregnant or trying to get pregnant  breast-feeding How should I use this medicine? This drug is given as an infusion or injection into a vein. It is administered in a hospital or clinic by a specially trained health care professional. Talk to your pediatrician regarding the use of this medicine in children. Special care may be needed. Overdosage: If you think you have taken too much of this medicine contact a poison control center or emergency room at once. NOTE: This medicine is only for you. Do not share this medicine with others. What if I miss a dose? It is important not to miss your dose. Call your doctor or health care professional if you are unable to keep an appointment. What may interact with this medicine?  allopurinol  cimetidine  dapsone  digoxin  hydroxyurea  leucovorin  levamisole  medicines for seizures like ethotoin, fosphenytoin, phenytoin  medicines to increase blood counts like filgrastim,  pegfilgrastim, sargramostim  medicines that treat or prevent blood clots like warfarin, enoxaparin, and dalteparin  methotrexate  metronidazole  pyrimethamine  some other chemotherapy drugs like busulfan, cisplatin, estramustine, vinblastine  trimethoprim  trimetrexate  vaccines Talk to your doctor or health care professional before taking any of these medicines:  acetaminophen  aspirin  ibuprofen  ketoprofen  naproxen This list may not describe all possible interactions. Give your health care provider a list of all the medicines, herbs, non-prescription drugs, or dietary supplements you use. Also tell them if you smoke, drink alcohol, or use illegal drugs. Some items may interact  with your medicine. What should I watch for while using this medicine? Visit your doctor for checks on your progress. This drug may make you feel generally unwell. This is not uncommon, as chemotherapy can affect healthy cells as well as cancer cells. Report any side effects. Continue your course of treatment even though you feel ill unless your doctor tells you to stop. In some cases, you may be given additional medicines to help with side effects. Follow all directions for their use. Call your doctor or health care professional for advice if you get a fever, chills or sore throat, or other symptoms of a cold or flu. Do not treat yourself. This drug decreases your body's ability to fight infections. Try to avoid being around people who are sick. This medicine may increase your risk to bruise or bleed. Call your doctor or health care professional if you notice any unusual bleeding. Be careful brushing and flossing your teeth or using a toothpick because you may get an infection or bleed more easily. If you have any dental work done, tell your dentist you are receiving this medicine. Avoid taking products that contain aspirin, acetaminophen, ibuprofen, naproxen, or ketoprofen unless instructed by your  doctor. These medicines may hide a fever. Do not become pregnant while taking this medicine. Women should inform their doctor if they wish to become pregnant or think they might be pregnant. There is a potential for serious side effects to an unborn child. Talk to your health care professional or pharmacist for more information. Do not breast-feed an infant while taking this medicine. Men should inform their doctor if they wish to father a child. This medicine may lower sperm counts. Do not treat diarrhea with over the counter products. Contact your doctor if you have diarrhea that lasts more than 2 days or if it is severe and watery. This medicine can make you more sensitive to the sun. Keep out of the sun. If you cannot avoid being in the sun, wear protective clothing and use sunscreen. Do not use sun lamps or tanning beds/booths. What side effects may I notice from receiving this medicine? Side effects that you should report to your doctor or health care professional as soon as possible:  allergic reactions like skin rash, itching or hives, swelling of the face, lips, or tongue  low blood counts - this medicine may decrease the number of white blood cells, red blood cells and platelets. You may be at increased risk for infections and bleeding.  signs of infection - fever or chills, cough, sore throat, pain or difficulty passing urine  signs of decreased platelets or bleeding - bruising, pinpoint red spots on the skin, black, tarry stools, blood in the urine  signs of decreased red blood cells - unusually weak or tired, fainting spells, lightheadedness  breathing problems  changes in vision  chest pain  mouth sores  nausea and vomiting  pain, swelling, redness at site where injected  pain, tingling, numbness in the hands or feet  redness, swelling, or sores on hands or feet  stomach pain  unusual bleeding Side effects that usually do not require medical attention (report to  your doctor or health care professional if they continue or are bothersome):  changes in finger or toe nails  diarrhea  dry or itchy skin  hair loss  headache  loss of appetite  sensitivity of eyes to the light  stomach upset  unusually teary eyes This list may not describe all possible side effects.  Call your doctor for medical advice about side effects. You may report side effects to FDA at 1-800-FDA-1088. Where should I keep my medicine? This drug is given in a hospital or clinic and will not be stored at home. NOTE: This sheet is a summary. It may not cover all possible information. If you have questions about this medicine, talk to your doctor, pharmacist, or health care provider.  2020 Elsevier/Gold Standard (2008-03-26 13:53:16)

## 2019-11-14 ENCOUNTER — Telehealth: Payer: Self-pay | Admitting: Oncology

## 2019-11-14 ENCOUNTER — Telehealth: Payer: Self-pay | Admitting: *Deleted

## 2019-11-14 NOTE — Telephone Encounter (Signed)
I talk with patient regarding schedule  

## 2019-11-15 ENCOUNTER — Encounter: Payer: Self-pay | Admitting: General Practice

## 2019-11-15 NOTE — Progress Notes (Signed)
[facility] CSW Progress Notes  Follow up call to patient to assess progress towards goal of increased financial assistance.  Pt states that she did not go to [employer] but has gotten need taken care of.  Did request [employer] gas card for next visit - cards given to B Epperson in [employer] and patient asked to come to [facility] on 12./18 to collect.  Edwyna Shell, LCSW Clinical Social Worker Phone:  [phone]

## 2019-11-21 ENCOUNTER — Other Ambulatory Visit: Payer: Self-pay

## 2019-11-21 ENCOUNTER — Inpatient Hospital Stay: Payer: Medicaid Other

## 2019-11-21 DIAGNOSIS — C50411 Malignant neoplasm of upper-outer quadrant of right female breast: Secondary | ICD-10-CM

## 2019-11-21 DIAGNOSIS — C50919 Malignant neoplasm of unspecified site of unspecified female breast: Secondary | ICD-10-CM

## 2019-11-21 DIAGNOSIS — Z171 Estrogen receptor negative status [ER-]: Secondary | ICD-10-CM

## 2019-11-21 DIAGNOSIS — C7951 Secondary malignant neoplasm of bone: Secondary | ICD-10-CM

## 2019-11-21 DIAGNOSIS — Z5111 Encounter for antineoplastic chemotherapy: Secondary | ICD-10-CM | POA: Diagnosis not present

## 2019-11-21 DIAGNOSIS — D630 Anemia in neoplastic disease: Secondary | ICD-10-CM

## 2019-11-21 LAB — CBC WITH DIFFERENTIAL/PLATELET
Abs Immature Granulocytes: 0.02 10*3/uL (ref 0.00–0.07)
Basophils Absolute: 0 10*3/uL (ref 0.0–0.1)
Basophils Relative: 0 %
Eosinophils Absolute: 0 10*3/uL (ref 0.0–0.5)
Eosinophils Relative: 1 %
HCT: 22.7 % — ABNORMAL LOW (ref 36.0–46.0)
Hemoglobin: 7.5 g/dL — ABNORMAL LOW (ref 12.0–15.0)
Immature Granulocytes: 1 %
Lymphocytes Relative: 50 %
Lymphs Abs: 1.5 10*3/uL (ref 0.7–4.0)
MCH: 30.6 pg (ref 26.0–34.0)
MCHC: 33 g/dL (ref 30.0–36.0)
MCV: 92.7 fL (ref 80.0–100.0)
Monocytes Absolute: 0.3 10*3/uL (ref 0.1–1.0)
Monocytes Relative: 11 %
Neutro Abs: 1.1 10*3/uL — ABNORMAL LOW (ref 1.7–7.7)
Neutrophils Relative %: 37 %
Platelets: 99 10*3/uL — ABNORMAL LOW (ref 150–400)
RBC: 2.45 MIL/uL — ABNORMAL LOW (ref 3.87–5.11)
RDW: 20.6 % — ABNORMAL HIGH (ref 11.5–15.5)
WBC: 2.9 10*3/uL — ABNORMAL LOW (ref 4.0–10.5)
nRBC: 3.1 % — ABNORMAL HIGH (ref 0.0–0.2)

## 2019-11-21 LAB — RETICULOCYTES
Immature Retic Fract: 37.4 % — ABNORMAL HIGH (ref 2.3–15.9)
RBC.: 2.44 MIL/uL — ABNORMAL LOW (ref 3.87–5.11)
Retic Count, Absolute: 45.6 10*3/uL (ref 19.0–186.0)
Retic Ct Pct: 1.9 % (ref 0.4–3.1)

## 2019-11-21 LAB — COMPREHENSIVE METABOLIC PANEL
ALT: 17 U/L (ref 0–44)
AST: 19 U/L (ref 15–41)
Albumin: 4.3 g/dL (ref 3.5–5.0)
Alkaline Phosphatase: 64 U/L (ref 38–126)
Anion gap: 11 (ref 5–15)
BUN: 10 mg/dL (ref 6–20)
CO2: 27 mmol/L (ref 22–32)
Calcium: 9.2 mg/dL (ref 8.9–10.3)
Chloride: 103 mmol/L (ref 98–111)
Creatinine, Ser: 0.91 mg/dL (ref 0.44–1.00)
GFR calc Af Amer: >60 mL/min (ref >60)
GFR calc non Af Amer: >60 mL/min (ref >60)
Glucose, Bld: 106 mg/dL — ABNORMAL HIGH (ref 70–99)
Potassium: 3.7 mmol/L (ref 3.5–5.1)
Sodium: 141 mmol/L (ref 135–145)
Total Bilirubin: 0.4 mg/dL (ref 0.3–1.2)
Total Protein: 8.1 g/dL (ref 6.5–8.1)

## 2019-11-21 LAB — SAMPLE TO BLOOD BANK

## 2019-11-21 NOTE — Progress Notes (Signed)
Rebecca [name]  [phone] [phone]   ID: [name] DOB: 1976/06/29  MR#: [id number]  PZW#:[id number]  Patient Care Team: Dorena Dew, FNP as PCP - General (Family Medicine) Alphonsa Overall, MD as Consulting Physician (General Surgery) Tallie Dodds, [name], MD as Consulting Physician (Oncology) Gery Pray, MD as Consulting Physician (Radiation Oncology) Delice Bison, Charlestine Massed, NP as Nurse Practitioner (Hematology and Oncology) Alda Berthold, DO as Consulting Physician (Neurology) OTHER MD:  CHIEF COMPLAINT: triple negative stage IV breast cancer  CURRENT TREATMENT: CMF chemotherapy; [zoledronate]   INTERVAL HISTORY: Rebecca [name] returns today for follow-up and treatment of he triple negative breast cancer.    Because of disease progression we switched her chemotherapy to CMF with her first dose on 11/13/2019.  Today is cycle 1 day 10.  We are holding the zoledronate until she gets some dental extractions done.   REVIEW OF SYSTEMS: [name] tolerated her first dose of CMF well.  She did not have significant fatigue.  She had minimal nausea even though she did not take any nausea medicines.  One night she had a tolerance.  She says her energy is fine and she is able to continue to work.  Her port worked well.  She does have anemia.  She has mild shortness of breath associated with this but this is intermittent.  She has not had fevers bleeding or other significant systemic symptoms.  A detailed review of systems today was stable   BREAST CANCER HISTORY: From the original intake note:  [name] herself noted a change in her right breast sometime around September or October 2016. She did not bring it to intermediate medical attention, but on 01/13/2016 she established herself in Dr. Smith Robert' service and she was set up for bilateral diagnostic mammography with tomosynthesis and bilateral ultrasonography at the [facility] 01/19/2016. The breast density  was category B. In the upper outer quadrant of the right breast there was a spiculated mass measuring 2.8 cm. On physical exam this was palpable. Targeted ultrasonography confirmed an irregular hypoechoic mass in the right breast 11:30 o'clock position measuring 2.6 cm maximally. Ultrasound of the right axilla showed a morphologically abnormal lymph node.  In the left breast there were some tubular densities behind the areola which by ultrasonography showed benign ductal ectasia.  On 01/28/2016 [name] underwent biopsy of the right breast mass and abnormal right axillary lymph node. The pathology from this procedure (S [facility] [phone]) showed the lymph node to be benign. In the breast however there was an invasive ductal carcinoma, grade 3, which was estrogen and progesterone receptor negative. The proliferation marker was 70%. HER-2 was not amplified with a signals ratio of 1.32. The number per cell was 2.05.  The patient's subsequent history is as detailed below   PAST MEDICAL HISTORY: Past Medical History:  Diagnosis Date  . Anxiety   . Breast cancer ([place])   . Depression   . GERD (gastroesophageal reflux disease)   . History of radiation therapy 11/15/16-01/12/17   right chest wall and axilla treated to 45 Gy in 25 fractions, boosted and additional 14 Gy in 8 fractions  . Hypertension    diet controlled  . Obesity (BMI 35.0-39.9 without comorbidity)   . Pneumonia    as a child  . Seasonal allergies   . Sickle cell trait ([place])   . Termination of pregnancy (fetus) 04/02/16    PAST SURGICAL HISTORY: Past Surgical History:  Procedure Laterality Date  . CESAREAN SECTION  2004 and 2007  . MASTECTOMY W/ SENTINEL NODE BIOPSY Right 09/06/2016   Procedure: RIGHT BREAST MASTECTOMY WITH RIGHT AXILLARY SENTINEL LYMPH NODE BIOPSY;  Surgeon: Alphonsa Overall, MD;  Location: [facility];  Service: General;  Laterality: Right;  . PORT-A-CATH REMOVAL Left 09/06/2016   Procedure: REMOVAL  PORT-A-CATH;  Surgeon: Alphonsa Overall, MD;  Location: [facility];  Service: General;  Laterality: Left;  . PORTACATH PLACEMENT    . PORTACATH PLACEMENT N/A 07/09/2019   Procedure: INSERTION PORT-A-CATH WITH ULTRASOUND;  Surgeon: Alphonsa Overall, MD;  Location[facility];  Service: General;  Laterality: N/A;    FAMILY HISTORY Family History  Problem Relation Age of Onset  . Hypertension Mother   . Cancer Mother        dx "intestinal cancer" in her 75s; +surgery  . Other Mother        hysterectomy at young age for unspecified cause  . Heart Problems Mother   . Breast cancer Cousin        maternal 1st cousin dx female breast cancer at 46-46y  . Cancer Father   . Hypertension Father   . Heart Problems Maternal Aunt   . Diabetes Maternal Aunt   . Breast cancer Maternal Uncle        dx 64-65  . Heart Problems Maternal Uncle   . Breast cancer Maternal Grandmother 16  . Throat cancer Maternal Grandfather        d. 37s; smoker  . Sickle cell anemia Paternal Aunt   . Congestive Heart Failure Maternal Aunt   . Multiple sclerosis Cousin   . Cancer Other        maternal great uncle (MGM's brother); cancer removed from his side  . Heart attack Paternal Aunt        d. early 17s  The patient has very little information about her father. Her mother is currently 62 years old. She had a history of cervical cancer at age 31. The patient had 2 brothers, no sisters. The maternal grandfather had throat cancer. A maternal uncle was diagnosed with breast cancer as well as prostate cancer at the age of 2. 2 maternal cousins, one of the mail, had breast cancer as well.   GYNECOLOGIC HISTORY:  No LMP recorded. Menarche age 59, first live birth age 28. The patient is GX P4. She was still having regular periods at the time of diagnosis. She took oral contraceptives in the 1990s with no side effects.--.  Stopped with chemotherapy and have not resumed as of May 2019   SOCIAL HISTORY:  (Updated  10/2018) She works as a Market researcher. The patient's significant other [name] works at [employer].  At home with the patient are her 3 children [name], [name] and [place]. There are age 56, 59, and 45 as of November 2019.  2 of them are disabled or have significant health problems, one with sickle cell disease, the other with autism and developmental delay.  The patient's son [name], currently 70 years old, lives in [place].    ADVANCED DIRECTIVES: Not in place   HEALTH MAINTENANCE: Social History   Tobacco Use  . Smoking status: Former Smoker    Packs/day: 1.00    Years: 20.00    Pack years: 20.00    Types: Cigarettes    Quit date: 04/01/2018    Years since quitting: 1.6  . Smokeless tobacco: Never Used  . Tobacco comment: Patient has quit  smoking x 1 year now  Substance Use Topics  . Alcohol use: Yes    Comment: occ  . Drug use: No    Colonoscopy:  PAP:  Bone density:  Lipid panel:  No Known Allergies  Current Outpatient Medications on File Prior to Visit  Medication Sig Dispense Refill  . calcium carbonate (TUMS - DOSED IN MG ELEMENTAL CALCIUM) 500 MG chewable tablet Chew 1 tablet by mouth daily as needed for indigestion or heartburn.    Marland Kitchen LORazepam (ATIVAN) 0.5 MG tablet Take 1 tablet (0.5 mg total) by mouth daily as needed for anxiety. 30 tablet 0  . metroNIDAZOLE (FLAGYL) 500 MG tablet Take 1 tablet (500 mg total) by mouth 2 (two) times daily. Crush one tablet and apply to affected site two times a day with dressing changes 60 tablet 1  . omeprazole (PRILOSEC) 20 MG capsule Take 1 capsule (20 mg total) by mouth daily. 30 capsule 1  . traMADol (ULTRAM) 50 MG tablet Take 1 tablet (50 mg total) by mouth every 6 (six) hours as needed. 30 tablet 0  . [DISCONTINUED] prochlorperazine (COMPAZINE) 10 MG tablet Take 1 tablet (10 mg total) by mouth every 6 (six) hours as needed (Nausea or vomiting). 30 tablet 1   No current  facility-administered medications on file prior to visit.    OBJECTIVE: Young African-American [name] in no acute distress  There were no vitals filed for this visit. Wt Readings from Last 3 Encounters:  11/13/19 208 lb 6.4 oz (94.5 kg)  11/06/19 209 lb 9.6 oz (95.1 kg)  10/23/19 210 lb (95.3 kg)   There is no height or weight on file to calculate BMI.    ECOG FS:1 - Symptomatic but completely ambulatory  Sclerae unicteric, EOMs intact Wearing a mask No cervical or supraclavicular adenopathy Lungs no rales or rhonchi Heart regular rate and rhythm Abd soft, nontender, positive bowel sounds MSK no focal spinal tenderness, no right upper extremity lymphedema Neuro: nonfocal, well oriented, appropriate affect Breasts: The left breast is unremarkable.  The right breast is status post mastectomy and the chest wall area of recurrence is imaged below.  Right chest wall area 11/22/2019, which is the new baseline   LAB RESULTS: CMP Latest Ref Rng & Units 11/21/2019 11/13/2019 11/08/2019  Glucose 70 - 99 mg/dL 106(H) 111(H) 103(H)  BUN 6 - 20 mg/dL 10 7 9   Creatinine 0.44 - 1.00 mg/dL 0.91 0.86 0.84  Sodium 135 - 145 mmol/L 141 142 142  Potassium 3.5 - 5.1 mmol/L 3.7 3.3(L) 3.2(L)  Chloride 98 - 111 mmol/L 103 105 105  CO2 22 - 32 mmol/L 27 29 29   Calcium 8.9 - 10.3 mg/dL 9.2 8.5(L) 8.2(L)  Total Protein 6.5 - 8.1 g/dL 8.1 7.6 7.2  Total Bilirubin 0.3 - 1.2 mg/dL 0.4 0.6 <0.2(L)  Alkaline Phos 38 - 126 U/L 64 66 68  AST 15 - 41 U/L 19 44(H) 21  ALT 0 - 44 U/L 17 46(H) 23     CBC    Component Value Date/Time   WBC 2.9 (L) 11/21/2019 1445   RBC 2.45 (L) 11/21/2019 1445   RBC 2.44 (L) 11/21/2019 1445   HGB 7.5 (L) 11/21/2019 1445   HGB 6.3 (LL) 11/08/2019 1350   HGB 10.4 (L) 10/04/2016 1154   HCT 22.7 (L) 11/21/2019 1445   HCT 31.3 (L) 10/04/2016 1154   PLT 99 (L) 11/21/2019 1445   PLT 10 (L) 11/08/2019 1350   PLT 320 10/04/2016 1154   MCV  92.7 11/21/2019 1445   MCV 95.7  10/04/2016 1154   MCH 30.6 11/21/2019 1445   MCHC 33.0 11/21/2019 1445   RDW 20.6 (H) 11/21/2019 1445   RDW 16.3 (H) 10/04/2016 1154   LYMPHSABS 1.5 11/21/2019 1445   LYMPHSABS 1.7 10/04/2016 1154   MONOABS 0.3 11/21/2019 1445   MONOABS 0.5 10/04/2016 1154   EOSABS 0.0 11/21/2019 1445   EOSABS 0.3 10/04/2016 1154   BASOSABS 0.0 11/21/2019 1445   BASOSABS 0.0 10/04/2016 1154    STUDIES: CT Chest W Contrast  Result Date: 11/13/2019 CLINICAL DATA:  Recurrent right breast cancer, status post mastectomy, ongoing chemotherapy EXAM: CT CHEST, ABDOMEN, AND PELVIS WITH CONTRAST TECHNIQUE: Multidetector CT imaging of the chest, abdomen and pelvis was performed following the standard protocol during bolus administration of intravenous contrast. CONTRAST:  142m OMNIPAQUE IOHEXOL 300 MG/ML SOLN, additional oral enteric contrast COMPARISON:  CT chest abdomen pelvis, 09/05/2019, PET-CT, 06/19/2019, CT chest, 06/11/2019 FINDINGS: CT CHEST FINDINGS Cardiovascular: Left chest port catheter. Normal heart size. No pericardial effusion. Mediastinum/Nodes: Slight interval increase in size of left axillary and subpectoral lymph nodes, a dominant large left axillary lymph node, measuring 3.0 x 1.5 cm, previously 2.6 x 1.3 cm (series 2, image 11). Slight interval enlargement of subcentimeter right axillary lymph nodes, an index node measuring 6 mm, previously 3 mm (series 2, image 17). Stable subcentimeter right internal mammary nodes (series 2, image 16). Thymic remnant in the anterior mediastinum. Thyroid gland, trachea, and esophagus demonstrate no significant findings. Lungs/Pleura: Lungs are clear. No pleural effusion or pneumothorax. Musculoskeletal: Redemonstrated spiculated soft tissue mass of the right pectoralis major muscle, which is again difficult to accurately measure due to isoattenuating with adjacent muscle, although when guided by prior PET avidity measures approximately 3.1 x 2.4 cm, and does not appear  significantly changed (series 2, image 17). There is a new adjacent solid soft tissue nodule overlying the anterior superior pectoralis major measuring 1.4 x 0.9 cm at the site of subtle FDG avidity and fat stranding seen on initial staging PET-CT (series 2, image 10). No significant change in more ill-defined soft tissue nodules at the lateral margin of the mastectomy site which were FDG avid on prior examination (series 2, image 21). CT ABDOMEN PELVIS FINDINGS Hepatobiliary: No solid liver abnormality is seen. Hepatic steatosis. No gallstones, gallbladder wall thickening, or biliary dilatation. Pancreas: Unremarkable. No pancreatic ductal dilatation or surrounding inflammatory changes. Spleen: Normal in size without significant abnormality. Adrenals/Urinary Tract: Adrenal glands are unremarkable. Kidneys are normal, without renal calculi, solid lesion, or hydronephrosis. Bladder is unremarkable. Stomach/Bowel: Stomach is within normal limits. Appendix appears normal. No evidence of bowel wall thickening, distention, or inflammatory changes. Vascular/Lymphatic: No significant vascular findings are present. No enlarged abdominal or pelvic lymph nodes. Reproductive: No mass or other abnormality. Other: No abdominal wall hernia or abnormality. No abdominopelvic ascites. Musculoskeletal: No acute or significant osseous findings. IMPRESSION: 1. Slight interval increase in size of left axillary and subpectoral lymph nodes, a dominant large left axillary lymph node, measuring 3.0 x 1.5 cm, previously 2.6 x 1.3 cm (series 2, image 11). Slight interval enlargement of subcentimeter right axillary lymph nodes, an index node measuring 6 mm, previously 3 mm (series 2, image 17). Findings are consistent with worsened nodal metastatic disease. 2. Redemonstrated spiculated soft tissue mass of the right pectoralis major muscle, which is again difficult to accurately measure due to isoattenuating with adjacent muscle, although when  guided by prior PET avidity measures approximately 3.1 x 2.4 cm,  and does not appear significantly changed (series 2, image 17). There is a new adjacent solid soft tissue nodule overlying the anterior superior pectoralis major measuring 1.4 x 0.9 cm at the site of subtle FDG avidity and fat stranding seen on initial staging PET-CT (series 2, image 10). Findings are consistent with worsened local disease although the dominant mass is unchanged. 3. No evidence of metastatic disease within the abdomen or pelvis. Electronically Signed   By: Eddie Candle M.D.   On: 11/13/2019 09:36   NM Bone Scan Whole Body  Result Date: 11/13/2019 CLINICAL DATA:  43 year old with prior malignant RIGHT mastectomy in 2017 with recurrence for which the patient is currently undergoing chemotherapy. Staging evaluation. EXAM: NUCLEAR MEDICINE WHOLE BODY BONE SCAN TECHNIQUE: Whole body anterior and posterior images were obtained approximately 3 hours after intravenous injection of radiopharmaceutical. RADIOPHARMACEUTICALS:  21.7 mCi Technetium-16mMDP IV COMPARISON:  No prior bone scan.  PET-CT 06/19/2019 is correlated. FINDINGS: No abnormal activity in the skeleton to suggest osseous metastatic disease. Expected excretion of contrast by the urinary tract. No visible degenerative joint activity. IMPRESSION: Normal examination. Specifically, no evidence of osseous metastatic disease. Electronically Signed   By: TEvangeline DakinM.D.   On: 11/13/2019 15:28   CT Abdomen Pelvis W Contrast  Result Date: 11/13/2019 CLINICAL DATA:  Recurrent right breast cancer, status post mastectomy, ongoing chemotherapy EXAM: CT CHEST, ABDOMEN, AND PELVIS WITH CONTRAST TECHNIQUE: Multidetector CT imaging of the chest, abdomen and pelvis was performed following the standard protocol during bolus administration of intravenous contrast. CONTRAST:  1055mOMNIPAQUE IOHEXOL 300 MG/ML SOLN, additional oral enteric contrast COMPARISON:  CT chest abdomen pelvis,  09/05/2019, PET-CT, 06/19/2019, CT chest, 06/11/2019 FINDINGS: CT CHEST FINDINGS Cardiovascular: Left chest port catheter. Normal heart size. No pericardial effusion. Mediastinum/Nodes: Slight interval increase in size of left axillary and subpectoral lymph nodes, a dominant large left axillary lymph node, measuring 3.0 x 1.5 cm, previously 2.6 x 1.3 cm (series 2, image 11). Slight interval enlargement of subcentimeter right axillary lymph nodes, an index node measuring 6 mm, previously 3 mm (series 2, image 17). Stable subcentimeter right internal mammary nodes (series 2, image 16). Thymic remnant in the anterior mediastinum. Thyroid gland, trachea, and esophagus demonstrate no significant findings. Lungs/Pleura: Lungs are clear. No pleural effusion or pneumothorax. Musculoskeletal: Redemonstrated spiculated soft tissue mass of the right pectoralis major muscle, which is again difficult to accurately measure due to isoattenuating with adjacent muscle, although when guided by prior PET avidity measures approximately 3.1 x 2.4 cm, and does not appear significantly changed (series 2, image 17). There is a new adjacent solid soft tissue nodule overlying the anterior superior pectoralis major measuring 1.4 x 0.9 cm at the site of subtle FDG avidity and fat stranding seen on initial staging PET-CT (series 2, image 10). No significant change in more ill-defined soft tissue nodules at the lateral margin of the mastectomy site which were FDG avid on prior examination (series 2, image 21). CT ABDOMEN PELVIS FINDINGS Hepatobiliary: No solid liver abnormality is seen. Hepatic steatosis. No gallstones, gallbladder wall thickening, or biliary dilatation. Pancreas: Unremarkable. No pancreatic ductal dilatation or surrounding inflammatory changes. Spleen: Normal in size without significant abnormality. Adrenals/Urinary Tract: Adrenal glands are unremarkable. Kidneys are normal, without renal calculi, solid lesion, or  hydronephrosis. Bladder is unremarkable. Stomach/Bowel: Stomach is within normal limits. Appendix appears normal. No evidence of bowel wall thickening, distention, or inflammatory changes. Vascular/Lymphatic: No significant vascular findings are present. No enlarged abdominal or pelvic lymph  nodes. Reproductive: No mass or other abnormality. Other: No abdominal wall hernia or abnormality. No abdominopelvic ascites. Musculoskeletal: No acute or significant osseous findings. IMPRESSION: 1. Slight interval increase in size of left axillary and subpectoral lymph nodes, a dominant large left axillary lymph node, measuring 3.0 x 1.5 cm, previously 2.6 x 1.3 cm (series 2, image 11). Slight interval enlargement of subcentimeter right axillary lymph nodes, an index node measuring 6 mm, previously 3 mm (series 2, image 17). Findings are consistent with worsened nodal metastatic disease. 2. Redemonstrated spiculated soft tissue mass of the right pectoralis major muscle, which is again difficult to accurately measure due to isoattenuating with adjacent muscle, although when guided by prior PET avidity measures approximately 3.1 x 2.4 cm, and does not appear significantly changed (series 2, image 17). There is a new adjacent solid soft tissue nodule overlying the anterior superior pectoralis major measuring 1.4 x 0.9 cm at the site of subtle FDG avidity and fat stranding seen on initial staging PET-CT (series 2, image 10). Findings are consistent with worsened local disease although the dominant mass is unchanged. 3. No evidence of metastatic disease within the abdomen or pelvis. Electronically Signed   By: Eddie Candle M.D.   On: 11/13/2019 09:36    RESEARCH: Referred to PREVENT study, but was pregnant at the time; referred to [facility] 11202, but the biopsied lymph node was benign; referred to weight loss study but declined; referred to [facility] 12/09/2000, but the timing of radiation and 8 was greater than 60 days past  the date of diagnosis; referred to health disparity study, but declined; referred to [id number] adjuvant therapy study, but the patient received Xeloda with radiation and therefore was ineligible   ASSESSMENT: 43 y.o. Rebecca [name] status post right breast upper-outer quadrant biopsy 01/28/2016 for a clinical T2 N0 invasive ductal carcinoma, grade 3, triple negative, with an MIB-1 of 70%.  (a) suspicious right axillary lymph node biopsied 01/28/2016 was benign  (1) neoadjuvant chemotherapy: doxorubicin and cyclophosphamide in dose dense fashion 4 started 04/14/16, completed 05/26/2016, followed by paclitaxel and carboplatin weekly 12, Started 06/09/2016  (a) taxol discontinued after 7 doses because of neuropathy, last dose 07/21/2016  (2) genetics testing October 20, 2016 through the 32-gene Comprehensive Cancer Panel offered by [facility] Junius Roads, MD) (with MSH2 Exons 1-7 Inversion Analysis) found no deleterious mutations or VUSS  In APC, ATM, AXIN2, BARD1, BMPR1A, BRCA1, BRCA2, BRIP1, CDH1, CDK4, CDKN2A, CHEK2, EPCAM, FANCC, MLH1, MSH2, MSH6, MUTYH, NBN, PALB2, PMS2, POLD1, POLE, PTEN, RAD51C, RAD51D, SCG5/GREM1, SMAD4, STK11, TP53, VHL, and XRCC2.    (3) right mastectomy and sentinel lymph node sampling 09/06/2016 showed a residual ypT1c ypN0, invasive ductal carcinoma, grade 3, with negative margins. Repeat prognostic panel again triple negative   (4) adjuvant radiation with capecitabine/Xeloda sensitization 11/15/16 - 01/12/17 Site/dose:   Right Chest Wall and axilla (4 field) treated to 45 Gy in 25 fractions, and then Boosted an additional 14.4 Gy in 8 fractions.  (5) tobacco abuse disorder: The patient quit smoking 04/04/2018  METASTATIC DISEASE:  July 2020 (6) nonspecific changes noted on chest CT 06/11/2019 were clarified by PET scan 06/19/2019 showing hypermetabolic disease in the right anterior chest wall, right internal mammary nodes, right and left axillary nodes, but  no metastatic disease in the neck, lungs, abdomen or pelvis.  Bone marrow uptake suggests bony metastatic disease.  (a) CARIS requested obtained from 06/28/2019 sample confirmed a triple negativity, the tumor also was negative for the androgen receptor, was  MSI stable and mismatch repair status proficient, with a low mutational burden.  BRCA 1 and 2 were negative and PD-L1 was negative.  PI K3 showed a variant of uncertain significance.  However the tumor was genomic LOH high  (b) CA-27-29 is informative: was 118.3 on 06/04/2019  (7) zoledronate started 07/17/2019--on hold currently due to dental issues, and until 6 weeks at least after dental work  (8) carboplatin/ gemcitabine days 1 and 8 Q21 day cycle started 07/10/2019, last dose 10/30/2019  (a) restaging studies 09/2019 showed no progression  (b) restaging studies after 6 cycles showed mild disease progression  (9) cyclophosphamide, methotrexate, fluorouracil chemotherapy starting 11/13/2019, to be repeated every 21 days.  PLAN: Avo did well with her first cycle of CMF.  The plan is to proceed to a total of 4 cycles and then restage.  We will be able to observe the changes on her right chest wall area.  Hopefully this will "dry" and form some eschars in the coming weeks.  She will receive 2 units of blood today.  She will let us know if she has any complications from that or if she has any other symptoms prior to her return here for cycle 2 of CMF 12/04/2019  [name]. Donnis Pecha, MD 11/22/19 11:04 AM Medical Oncology and Hematology [facility] [place], [place] [address] Tel. [phone]    Fax. [phone]    I, Wilburn Mylar, am acting as scribe for Dr. [name]. Rebecca [name].  I, Lurline Del MD, have reviewed the above documentation for accuracy and completeness, and I agree with the above.

## 2019-11-22 ENCOUNTER — Inpatient Hospital Stay (HOSPITAL_BASED_OUTPATIENT_CLINIC_OR_DEPARTMENT_OTHER): Payer: Medicaid Other | Admitting: Oncology

## 2019-11-22 ENCOUNTER — Inpatient Hospital Stay: Payer: Medicaid Other

## 2019-11-22 ENCOUNTER — Other Ambulatory Visit: Payer: Self-pay

## 2019-11-22 VITALS — BP 124/91 | HR 101 | Temp 98.2°F | Resp 18 | Ht 61.5 in | Wt 208.0 lb

## 2019-11-22 DIAGNOSIS — C50919 Malignant neoplasm of unspecified site of unspecified female breast: Secondary | ICD-10-CM | POA: Diagnosis not present

## 2019-11-22 DIAGNOSIS — C7951 Secondary malignant neoplasm of bone: Secondary | ICD-10-CM

## 2019-11-22 DIAGNOSIS — T451X5A Adverse effect of antineoplastic and immunosuppressive drugs, initial encounter: Secondary | ICD-10-CM | POA: Diagnosis not present

## 2019-11-22 DIAGNOSIS — G620 Drug-induced polyneuropathy: Secondary | ICD-10-CM | POA: Diagnosis not present

## 2019-11-22 DIAGNOSIS — D630 Anemia in neoplastic disease: Secondary | ICD-10-CM

## 2019-11-22 DIAGNOSIS — Z7189 Other specified counseling: Secondary | ICD-10-CM

## 2019-11-22 DIAGNOSIS — C50411 Malignant neoplasm of upper-outer quadrant of right female breast: Secondary | ICD-10-CM

## 2019-11-22 DIAGNOSIS — Z171 Estrogen receptor negative status [ER-]: Secondary | ICD-10-CM

## 2019-11-22 DIAGNOSIS — Z6841 Body Mass Index (BMI) 40.0 and over, adult: Secondary | ICD-10-CM | POA: Diagnosis not present

## 2019-11-22 DIAGNOSIS — Z5111 Encounter for antineoplastic chemotherapy: Secondary | ICD-10-CM | POA: Diagnosis not present

## 2019-11-22 LAB — FERRITIN: Ferritin: 535 ng/mL — ABNORMAL HIGH (ref 11–307)

## 2019-11-22 MED ORDER — DIPHENHYDRAMINE HCL 25 MG PO CAPS
ORAL_CAPSULE | ORAL | Status: AC
  Filled 2019-11-22: qty 1

## 2019-11-22 MED ORDER — SODIUM CHLORIDE 0.9% IV SOLUTION
250.0000 mL | Freq: Once | INTRAVENOUS | Status: AC
  Administered 2019-11-22: 250 mL via INTRAVENOUS
  Filled 2019-11-22: qty 250

## 2019-11-22 MED ORDER — ACETAMINOPHEN 325 MG PO TABS
650.0000 mg | ORAL_TABLET | Freq: Once | ORAL | Status: AC
  Administered 2019-11-22: 650 mg via ORAL

## 2019-11-22 MED ORDER — SODIUM CHLORIDE 0.9% FLUSH
10.0000 mL | INTRAVENOUS | Status: AC | PRN
  Administered 2019-11-22: 10 mL
  Filled 2019-11-22: qty 10

## 2019-11-22 MED ORDER — ACETAMINOPHEN 325 MG PO TABS
ORAL_TABLET | ORAL | Status: AC
  Filled 2019-11-22: qty 2

## 2019-11-22 MED ORDER — DIPHENHYDRAMINE HCL 25 MG PO CAPS
25.0000 mg | ORAL_CAPSULE | Freq: Once | ORAL | Status: AC
  Administered 2019-11-22: 12:00:00 25 mg via ORAL

## 2019-11-22 MED ORDER — HEPARIN SOD (PORK) LOCK FLUSH 100 UNIT/ML IV SOLN
500.0000 [IU] | Freq: Every day | INTRAVENOUS | Status: AC | PRN
  Administered 2019-11-22: 500 [IU]
  Filled 2019-11-22: qty 5

## 2019-11-22 NOTE — Patient Instructions (Signed)

## 2019-11-24 LAB — TYPE AND SCREEN
ABO/RH(D): O POS
Antibody Screen: NEGATIVE
Unit division: 0
Unit division: 0

## 2019-11-24 LAB — BPAM RBC
Blood Product Expiration Date: 202101172359
Blood Product Expiration Date: 202101192359
ISSUE DATE / TIME: 202012181248
ISSUE DATE / TIME: 202012181248
Unit Type and Rh: 5100
Unit Type and Rh: 5100

## 2019-11-28 ENCOUNTER — Telehealth: Payer: Self-pay | Admitting: *Deleted

## 2019-11-28 ENCOUNTER — Inpatient Hospital Stay: Payer: Medicaid Other

## 2019-11-28 ENCOUNTER — Other Ambulatory Visit: Payer: Self-pay | Admitting: Hematology and Oncology

## 2019-11-28 ENCOUNTER — Inpatient Hospital Stay (HOSPITAL_BASED_OUTPATIENT_CLINIC_OR_DEPARTMENT_OTHER): Payer: Medicaid Other | Admitting: Adult Health

## 2019-11-28 DIAGNOSIS — Z171 Estrogen receptor negative status [ER-]: Secondary | ICD-10-CM

## 2019-11-28 DIAGNOSIS — C7951 Secondary malignant neoplasm of bone: Secondary | ICD-10-CM

## 2019-11-28 DIAGNOSIS — C50411 Malignant neoplasm of upper-outer quadrant of right female breast: Secondary | ICD-10-CM

## 2019-11-28 NOTE — Progress Notes (Signed)
Patient did not show for appointment today.

## 2019-11-28 NOTE — Telephone Encounter (Signed)
Called pt to f/u due to missed appt's today. Pt returned callback. Pt stated she had forgotten appt. Stated, "I thought my appt's were on the 30th." Also pt stated that she was tested yesterday for COVID due to her family member being exposed and is hospitalized. Advised pt to please call back with results so that we may schedule accordingly. Pt verbalized understanding.

## 2019-11-30 ENCOUNTER — Inpatient Hospital Stay: Payer: Medicaid Other

## 2019-12-10 ENCOUNTER — Inpatient Hospital Stay: Payer: Medicaid Other | Attending: Oncology | Admitting: Oncology

## 2019-12-10 ENCOUNTER — Other Ambulatory Visit: Payer: Self-pay

## 2019-12-10 VITALS — BP 111/85 | HR 98 | Temp 97.9°F | Resp 17 | Ht 61.5 in | Wt 201.1 lb

## 2019-12-10 DIAGNOSIS — C50919 Malignant neoplasm of unspecified site of unspecified female breast: Secondary | ICD-10-CM | POA: Diagnosis not present

## 2019-12-10 DIAGNOSIS — Z171 Estrogen receptor negative status [ER-]: Secondary | ICD-10-CM | POA: Insufficient documentation

## 2019-12-10 DIAGNOSIS — Z803 Family history of malignant neoplasm of breast: Secondary | ICD-10-CM | POA: Insufficient documentation

## 2019-12-10 DIAGNOSIS — C50411 Malignant neoplasm of upper-outer quadrant of right female breast: Secondary | ICD-10-CM | POA: Diagnosis not present

## 2019-12-10 DIAGNOSIS — N6042 Mammary duct ectasia of left breast: Secondary | ICD-10-CM | POA: Diagnosis not present

## 2019-12-10 DIAGNOSIS — G620 Drug-induced polyneuropathy: Secondary | ICD-10-CM | POA: Diagnosis not present

## 2019-12-10 DIAGNOSIS — K1230 Oral mucositis (ulcerative), unspecified: Secondary | ICD-10-CM | POA: Diagnosis not present

## 2019-12-10 DIAGNOSIS — Z87891 Personal history of nicotine dependence: Secondary | ICD-10-CM | POA: Insufficient documentation

## 2019-12-10 DIAGNOSIS — E669 Obesity, unspecified: Secondary | ICD-10-CM | POA: Diagnosis not present

## 2019-12-10 DIAGNOSIS — K219 Gastro-esophageal reflux disease without esophagitis: Secondary | ICD-10-CM | POA: Insufficient documentation

## 2019-12-10 DIAGNOSIS — Z9011 Acquired absence of right breast and nipple: Secondary | ICD-10-CM | POA: Insufficient documentation

## 2019-12-10 DIAGNOSIS — Z7189 Other specified counseling: Secondary | ICD-10-CM | POA: Diagnosis not present

## 2019-12-10 DIAGNOSIS — Z5111 Encounter for antineoplastic chemotherapy: Secondary | ICD-10-CM | POA: Diagnosis not present

## 2019-12-10 DIAGNOSIS — T451X5A Adverse effect of antineoplastic and immunosuppressive drugs, initial encounter: Secondary | ICD-10-CM

## 2019-12-10 DIAGNOSIS — K760 Fatty (change of) liver, not elsewhere classified: Secondary | ICD-10-CM | POA: Diagnosis not present

## 2019-12-10 DIAGNOSIS — F418 Other specified anxiety disorders: Secondary | ICD-10-CM | POA: Diagnosis not present

## 2019-12-10 DIAGNOSIS — I10 Essential (primary) hypertension: Secondary | ICD-10-CM | POA: Diagnosis not present

## 2019-12-10 DIAGNOSIS — Z6841 Body Mass Index (BMI) 40.0 and over, adult: Secondary | ICD-10-CM

## 2019-12-10 DIAGNOSIS — D573 Sickle-cell trait: Secondary | ICD-10-CM | POA: Diagnosis not present

## 2019-12-10 DIAGNOSIS — C778 Secondary and unspecified malignant neoplasm of lymph nodes of multiple regions: Secondary | ICD-10-CM | POA: Diagnosis not present

## 2019-12-10 DIAGNOSIS — C7951 Secondary malignant neoplasm of bone: Secondary | ICD-10-CM

## 2019-12-10 DIAGNOSIS — Z79899 Other long term (current) drug therapy: Secondary | ICD-10-CM | POA: Diagnosis not present

## 2019-12-10 NOTE — Progress Notes (Signed)
[place]  [phone] [phone]   ID: Rebecca [name] DOB: October 19, 1976  MR#: [id number]  EHO#:[id number]  Patient Care Team: Rebecca Dew, FNP as PCP - General (Family Medicine) Rebecca Overall, MD as Consulting Physician (General Surgery) Rebecca [name], Rebecca Dad, MD as Consulting Physician (Oncology) Rebecca Pray, MD as Consulting Physician (Radiation Oncology) Rebecca [name], Rebecca Massed, NP as Nurse Practitioner (Hematology and Oncology) Rebecca Berthold, DO as Consulting Physician (Neurology) OTHER MD:  CHIEF COMPLAINT: triple negative stage IV breast cancer  CURRENT TREATMENT: CMF chemotherapy; [zoledronate]   INTERVAL HISTORY: Rebecca [name] returns today for follow-up and treatment of he triple negative breast cancer.  She had been scheduled to have her second dose of CMF chemotherapy 12/04/2019 but for some reason she did not make that appointment.  Because of disease progression we switched her chemotherapy to CMF with her first dose on 11/13/2019.  She tolerated that remarkably well.  In fact she says she wants stronger chemotherapy because she had so few side effects and she does not see any improvement in the right chest wall area.  We are holding the zoledronate until she gets some dental extractions done.   REVIEW OF SYSTEMS: Rebecca [name] continues to work as before.  She had quite holidays at home with her children.  She denies unusual headaches visual changes nausea vomiting cough phlegm production pleurisy or shortness of breath.  She is concerned that there is a change in her left breast she wanted me to check as well.  A detailed review of systems today was otherwise stable   BREAST CANCER HISTORY: From the original intake note:  Rebecca [name] herself noted a change in her right breast sometime around September or October 2016. She did not bring it to intermediate medical attention, but on 01/13/2016 she established herself in Dr. Smith Robert' service and she was  set up for bilateral diagnostic mammography with tomosynthesis and bilateral ultrasonography at the [facility] 01/19/2016. The breast density was category B. In the upper outer quadrant of the right breast there was a spiculated mass measuring 2.8 cm. On physical exam this was palpable. Targeted ultrasonography confirmed an irregular hypoechoic mass in the right breast 11:30 o'clock position measuring 2.6 cm maximally. Ultrasound of the right axilla showed a morphologically abnormal lymph node.  In the left breast there were some tubular densities behind the areola which by ultrasonography showed benign ductal ectasia.  On 01/28/2016 Merelyn underwent biopsy of the right breast mass and abnormal right axillary lymph node. The pathology from this procedure (S [id number]) showed the lymph node to be benign. In the breast however there was an invasive ductal carcinoma, grade 3, which was estrogen and progesterone receptor negative. The proliferation marker was 70%. HER-2 was not amplified with a signals ratio of 1.32. The number per cell was 2.05.  The patient's subsequent history is as detailed below   PAST MEDICAL HISTORY: Past Medical History:  Diagnosis Date  . Anxiety   . Breast cancer ([place])   . Depression   . GERD (gastroesophageal reflux disease)   . History of radiation therapy 11/15/16-01/12/17   right chest wall and axilla treated to 45 Gy in 25 fractions, boosted and additional 14 Gy in 8 fractions  . Hypertension    diet controlled  . Obesity (BMI 35.0-39.9 without comorbidity)   . Pneumonia    as a child  . Seasonal allergies   . Sickle cell trait ([place])   . Termination of pregnancy (fetus) 04/02/16  PAST SURGICAL HISTORY: Past Surgical History:  Procedure Laterality Date  . CESAREAN SECTION     2004 and 2007  . MASTECTOMY W/ SENTINEL NODE BIOPSY Right 09/06/2016   Procedure: RIGHT BREAST MASTECTOMY WITH RIGHT AXILLARY SENTINEL LYMPH NODE BIOPSY;  Surgeon: Rebecca Overall, MD;   Location: [facility];  Service: General;  Laterality: Right;  . PORT-A-CATH REMOVAL Left 09/06/2016   Procedure: REMOVAL PORT-A-CATH;  Surgeon: Rebecca Overall, MD;  Location: [facility];  Service: General;  Laterality: Left;  . PORTACATH PLACEMENT    . PORTACATH PLACEMENT N/A 07/09/2019   Procedure: INSERTION PORT-A-CATH WITH ULTRASOUND;  Surgeon: Rebecca Overall, MD;  Location: [facility] OR;  Service: General;  Laterality: N/A;    FAMILY HISTORY Family History  Problem Relation Age of Onset  . Hypertension Mother   . Cancer Mother        dx "intestinal cancer" in her 18s; +surgery  . Other Mother        hysterectomy at young age for unspecified cause  . Heart Problems Mother   . Breast cancer Cousin        maternal 1st cousin dx female breast cancer at 12-46y  . Cancer Father   . Hypertension Father   . Heart Problems Maternal Aunt   . Diabetes Maternal Aunt   . Breast cancer Maternal Uncle        dx 64-65  . Heart Problems Maternal Uncle   . Breast cancer Maternal Grandmother 64  . Throat cancer Maternal Grandfather        d. 64s; smoker  . Sickle cell anemia Paternal Aunt   . Congestive Heart Failure Maternal Aunt   . Multiple sclerosis Cousin   . Cancer Other        maternal great uncle (MGM's brother); cancer removed from his side  . Heart attack Paternal Aunt        d. early 63s  The patient has very little information about her father. Her mother is currently 10 years old. She had a history of cervical cancer at age 2. The patient had 2 brothers, no sisters. The maternal grandfather had throat cancer. A maternal uncle was diagnosed with breast cancer as well as prostate cancer at the age of 41. 2 maternal cousins, one of them female, had breast cancer as well.   GYNECOLOGIC HISTORY:  No LMP recorded. Menarche age 61, first live birth age 14. The patient is GX P4. She was still having regular periods at the time of diagnosis. She took oral contraceptives in  the 1990s with no side effects.--.  Stopped with chemotherapy and have not resumed as of May 2019   SOCIAL HISTORY:  (Updated 10/2018) She works as a Market researcher. The patient's significant other [name] works at [employer].  At home with the patient are her 3 children [name], [place] and [place]. There are age 31, 25, and 27 as of November 2019.  2 of them are disabled or have significant health problems, one with sickle cell disease, the other with autism and developmental delay.  The patient's son [name], currently 84 years old, lives in [place].    ADVANCED DIRECTIVES: Not in place   HEALTH MAINTENANCE: Social History   Tobacco Use  . Smoking status: Former Smoker    Packs/day: 1.00    Years: 20.00    Pack years: 20.00    Types: Cigarettes    Quit date: 04/01/2018  Years since quitting: 1.6  . Smokeless tobacco: Never Used  . Tobacco comment: Patient has quit smoking x 1 year now  Substance Use Topics  . Alcohol use: Yes    Comment: occ  . Drug use: No    Colonoscopy:  PAP:  Bone density:  Lipid panel:  No Known Allergies  Current Outpatient Medications on File Prior to Visit  Medication Sig Dispense Refill  . calcium carbonate (TUMS - DOSED IN MG ELEMENTAL CALCIUM) 500 MG chewable tablet Chew 1 tablet by mouth daily as needed for indigestion or heartburn.    Marland Kitchen LORazepam (ATIVAN) 0.5 MG tablet Take 1 tablet (0.5 mg total) by mouth daily as needed for anxiety. 30 tablet 0  . metroNIDAZOLE (FLAGYL) 500 MG tablet Take 1 tablet (500 mg total) by mouth 2 (two) times daily. Crush one tablet and apply to affected site two times a day with dressing changes 60 tablet 1  . omeprazole (PRILOSEC) 20 MG capsule Take 1 capsule (20 mg total) by mouth daily. 30 capsule 1  . traMADol (ULTRAM) 50 MG tablet Take 1 tablet (50 mg total) by mouth every 6 (six) hours as needed. 30 tablet 0  . [DISCONTINUED] prochlorperazine (COMPAZINE) 10  MG tablet Take 1 tablet (10 mg total) by mouth every 6 (six) hours as needed (Nausea or vomiting). 30 tablet 1   No current facility-administered medications on file prior to visit.    OBJECTIVE: [name] who appears stated age  88:   12/10/19 1452  BP: 111/85  Pulse: 98  Resp: 17  Temp: 97.9 F (36.6 C)  SpO2: 100%   Wt Readings from Last 3 Encounters:  12/10/19 201 lb 1.6 oz (91.2 kg)  11/22/19 208 lb (94.3 kg)  11/13/19 208 lb 6.4 oz (94.5 kg)   Body mass index is 37.38 kg/m.    ECOG FS:1 - Symptomatic but completely ambulatory  Sclerae unicteric, EOMs intact Wearing a mask No cervical or supraclavicular adenopathy Lungs no rales or rhonchi Heart regular rate and rhythm Abd soft, nontender, positive bowel sounds MSK no focal spinal tenderness, no upper extremity lymphedema Neuro: nonfocal, well oriented, appropriate affect Breasts: The right breast is status post mastectomy with local recurrence.  Checking that area it does not look significantly different from prior.  The left breast shows a small, about 2 mm, hard movable area in the upper outer quadrant, without skin or nipple involvement.  Both axillae are benign.   Right chest wall area 11/22/2019, which is the new baseline   LAB RESULTS: CMP Latest Ref Rng & Units 11/21/2019 11/13/2019 11/08/2019  Glucose 70 - 99 mg/dL 106(H) 111(H) 103(H)  BUN 6 - 20 mg/dL _0 Creatinine 0.44 - 1.00 mg/dL 0.91 0.86 0.84  Sodium 135 - 145 mmol/L 141 142 142  Potassium 3.5 - 5.1 mmol/L 3.7 3.3(L) 3.2(L)  Chloride 98 - 111 mmol/L 103 105 105  CO2 22 - 32 mmol/L _1 Calcium 8.9 - 10.3 mg/dL 9.2 8.5(L) 8.2(L)  Total Protein 6.5 - 8.1 g/dL 8.1 7.6 7.2  Total Bilirubin 0.3 - 1.2 mg/dL 0.4 0.6 <0.2(L)  Alkaline Phos 38 - 126 U/L 64 66 68  AST 15 - 41 U/L 19 44(H) 21  ALT 0 - 44 U/L 17 46(H) 23     CBC    Component Value Date/Time   WBC 2.9 (L) 11/21/2019 1445   RBC 2.45 (L) 11/21/2019  1445   RBC 2.44 (L) 11/21/2019 1445  HGB 7.5 (L) 11/21/2019 1445   HGB 6.3 (LL) 11/08/2019 1350   HGB 10.4 (L) 10/04/2016 1154   HCT 22.7 (L) 11/21/2019 1445   HCT 31.3 (L) 10/04/2016 1154   PLT 99 (L) 11/21/2019 1445   PLT 10 (L) 11/08/2019 1350   PLT 320 10/04/2016 1154   MCV 92.7 11/21/2019 1445   MCV 95.7 10/04/2016 1154   MCH 30.6 11/21/2019 1445   MCHC 33.0 11/21/2019 1445   RDW 20.6 (H) 11/21/2019 1445   RDW 16.3 (H) 10/04/2016 1154   LYMPHSABS 1.5 11/21/2019 1445   LYMPHSABS 1.7 10/04/2016 1154   MONOABS 0.3 11/21/2019 1445   MONOABS 0.5 10/04/2016 1154   EOSABS 0.0 11/21/2019 1445   EOSABS 0.3 10/04/2016 1154   BASOSABS 0.0 11/21/2019 1445   BASOSABS 0.0 10/04/2016 1154    STUDIES: CT Chest W Contrast  Result Date: 11/13/2019 CLINICAL DATA:  Recurrent right breast cancer, status post mastectomy, ongoing chemotherapy EXAM: CT CHEST, ABDOMEN, AND PELVIS WITH CONTRAST TECHNIQUE: Multidetector CT imaging of the chest, abdomen and pelvis was performed following the standard protocol during bolus administration of intravenous contrast. CONTRAST:  158m OMNIPAQUE IOHEXOL 300 MG/ML SOLN, additional oral enteric contrast COMPARISON:  CT chest abdomen pelvis, 09/05/2019, PET-CT, 06/19/2019, CT chest, 06/11/2019 FINDINGS: CT CHEST FINDINGS Cardiovascular: Left chest port catheter. Normal heart size. No pericardial effusion. Mediastinum/Nodes: Slight interval increase in size of left axillary and subpectoral lymph nodes, a dominant large left axillary lymph node, measuring 3.0 x 1.5 cm, previously 2.6 x 1.3 cm (series 2, image 11). Slight interval enlargement of subcentimeter right axillary lymph nodes, an index node measuring 6 mm, previously 3 mm (series 2, image 17). Stable subcentimeter right internal mammary nodes (series 2, image 16). Thymic remnant in the anterior mediastinum. Thyroid gland, trachea, and esophagus demonstrate no significant findings. Lungs/Pleura: Lungs are clear.  No pleural effusion or pneumothorax. Musculoskeletal: Redemonstrated spiculated soft tissue mass of the right pectoralis major muscle, which is again difficult to accurately measure due to isoattenuating with adjacent muscle, although when guided by prior PET avidity measures approximately 3.1 x 2.4 cm, and does not appear significantly changed (series 2, image 17). There is a new adjacent solid soft tissue nodule overlying the anterior superior pectoralis major measuring 1.4 x 0.9 cm at the site of subtle FDG avidity and fat stranding seen on initial staging PET-CT (series 2, image 10). No significant change in more ill-defined soft tissue nodules at the lateral margin of the mastectomy site which were FDG avid on prior examination (series 2, image 21). CT ABDOMEN PELVIS FINDINGS Hepatobiliary: No solid liver abnormality is seen. Hepatic steatosis. No gallstones, gallbladder wall thickening, or biliary dilatation. Pancreas: Unremarkable. No pancreatic ductal dilatation or surrounding inflammatory changes. Spleen: Normal in size without significant abnormality. Adrenals/Urinary Tract: Adrenal glands are unremarkable. Kidneys are normal, without renal calculi, solid lesion, or hydronephrosis. Bladder is unremarkable. Stomach/Bowel: Stomach is within normal limits. Appendix appears normal. No evidence of bowel wall thickening, distention, or inflammatory changes. Vascular/Lymphatic: No significant vascular findings are present. No enlarged abdominal or pelvic lymph nodes. Reproductive: No mass or other abnormality. Other: No abdominal wall hernia or abnormality. No abdominopelvic ascites. Musculoskeletal: No acute or significant osseous findings. IMPRESSION: 1. Slight interval increase in size of left axillary and subpectoral lymph nodes, a dominant large left axillary lymph node, measuring 3.0 x 1.5 cm, previously 2.6 x 1.3 cm (series 2, image 11). Slight interval enlargement of subcentimeter right axillary lymph  nodes, an index node measuring  6 mm, previously 3 mm (series 2, image 17). Findings are consistent with worsened nodal metastatic disease. 2. Redemonstrated spiculated soft tissue mass of the right pectoralis major muscle, which is again difficult to accurately measure due to isoattenuating with adjacent muscle, although when guided by prior PET avidity measures approximately 3.1 x 2.4 cm, and does not appear significantly changed (series 2, image 17). There is a new adjacent solid soft tissue nodule overlying the anterior superior pectoralis major measuring 1.4 x 0.9 cm at the site of subtle FDG avidity and fat stranding seen on initial staging PET-CT (series 2, image 10). Findings are consistent with worsened local disease although the dominant mass is unchanged. 3. No evidence of metastatic disease within the abdomen or pelvis. Electronically Signed   By: Eddie Candle M.D.   On: 11/13/2019 09:36   NM Bone Scan Whole Body  Result Date: 11/13/2019 CLINICAL DATA:  44 year old with prior malignant RIGHT mastectomy in 2017 with recurrence for which the patient is currently undergoing chemotherapy. Staging evaluation. EXAM: NUCLEAR MEDICINE WHOLE BODY BONE SCAN TECHNIQUE: Whole body anterior and posterior images were obtained approximately 3 hours after intravenous injection of radiopharmaceutical. RADIOPHARMACEUTICALS:  21.7 mCi Technetium-60mMDP IV COMPARISON:  No prior bone scan.  PET-CT 06/19/2019 is correlated. FINDINGS: No abnormal activity in the skeleton to suggest osseous metastatic disease. Expected excretion of contrast by the urinary tract. No visible degenerative joint activity. IMPRESSION: Normal examination. Specifically, no evidence of osseous metastatic disease. Electronically Signed   By: TEvangeline DakinM.D.   On: 11/13/2019 15:28   CT Abdomen Pelvis W Contrast  Result Date: 11/13/2019 CLINICAL DATA:  Recurrent right breast cancer, status post mastectomy, ongoing chemotherapy EXAM: CT  CHEST, ABDOMEN, AND PELVIS WITH CONTRAST TECHNIQUE: Multidetector CT imaging of the chest, abdomen and pelvis was performed following the standard protocol during bolus administration of intravenous contrast. CONTRAST:  1047mOMNIPAQUE IOHEXOL 300 MG/ML SOLN, additional oral enteric contrast COMPARISON:  CT chest abdomen pelvis, 09/05/2019, PET-CT, 06/19/2019, CT chest, 06/11/2019 FINDINGS: CT CHEST FINDINGS Cardiovascular: Left chest port catheter. Normal heart size. No pericardial effusion. Mediastinum/Nodes: Slight interval increase in size of left axillary and subpectoral lymph nodes, a dominant large left axillary lymph node, measuring 3.0 x 1.5 cm, previously 2.6 x 1.3 cm (series 2, image 11). Slight interval enlargement of subcentimeter right axillary lymph nodes, an index node measuring 6 mm, previously 3 mm (series 2, image 17). Stable subcentimeter right internal mammary nodes (series 2, image 16). Thymic remnant in the anterior mediastinum. Thyroid gland, trachea, and esophagus demonstrate no significant findings. Lungs/Pleura: Lungs are clear. No pleural effusion or pneumothorax. Musculoskeletal: Redemonstrated spiculated soft tissue mass of the right pectoralis major muscle, which is again difficult to accurately measure due to isoattenuating with adjacent muscle, although when guided by prior PET avidity measures approximately 3.1 x 2.4 cm, and does not appear significantly changed (series 2, image 17). There is a new adjacent solid soft tissue nodule overlying the anterior superior pectoralis major measuring 1.4 x 0.9 cm at the site of subtle FDG avidity and fat stranding seen on initial staging PET-CT (series 2, image 10). No significant change in more ill-defined soft tissue nodules at the lateral margin of the mastectomy site which were FDG avid on prior examination (series 2, image 21). CT ABDOMEN PELVIS FINDINGS Hepatobiliary: No solid liver abnormality is seen. Hepatic steatosis. No gallstones,  gallbladder wall thickening, or biliary dilatation. Pancreas: Unremarkable. No pancreatic ductal dilatation or surrounding inflammatory changes. Spleen: Normal in  size without significant abnormality. Adrenals/Urinary Tract: Adrenal glands are unremarkable. Kidneys are normal, without renal calculi, solid lesion, or hydronephrosis. Bladder is unremarkable. Stomach/Bowel: Stomach is within normal limits. Appendix appears normal. No evidence of bowel wall thickening, distention, or inflammatory changes. Vascular/Lymphatic: No significant vascular findings are present. No enlarged abdominal or pelvic lymph nodes. Reproductive: No mass or other abnormality. Other: No abdominal wall hernia or abnormality. No abdominopelvic ascites. Musculoskeletal: No acute or significant osseous findings. IMPRESSION: 1. Slight interval increase in size of left axillary and subpectoral lymph nodes, a dominant large left axillary lymph node, measuring 3.0 x 1.5 cm, previously 2.6 x 1.3 cm (series 2, image 11). Slight interval enlargement of subcentimeter right axillary lymph nodes, an index node measuring 6 mm, previously 3 mm (series 2, image 17). Findings are consistent with worsened nodal metastatic disease. 2. Redemonstrated spiculated soft tissue mass of the right pectoralis major muscle, which is again difficult to accurately measure due to isoattenuating with adjacent muscle, although when guided by prior PET avidity measures approximately 3.1 x 2.4 cm, and does not appear significantly changed (series 2, image 17). There is a new adjacent solid soft tissue nodule overlying the anterior superior pectoralis major measuring 1.4 x 0.9 cm at the site of subtle FDG avidity and fat stranding seen on initial staging PET-CT (series 2, image 10). Findings are consistent with worsened local disease although the dominant mass is unchanged. 3. No evidence of metastatic disease within the abdomen or pelvis. Electronically Signed   By: Eddie Candle M.D.   On: 11/13/2019 09:36    RESEARCH: Referred to PREVENT study, but was pregnant at the time; referred to [facility] 11202, but the biopsied lymph node was benign; referred to weight loss study but declined; referred to [facility] 12/09/2000, but the timing of radiation and 8 was greater than 60 days past the date of diagnosis; referred to health disparity study, but declined; referred to [id number] adjuvant therapy study, but the patient received Xeloda with radiation and therefore was ineligible   ASSESSMENT: 44 y.o. [place] woman status post right breast upper-outer quadrant biopsy 01/28/2016 for a clinical T2 N0 invasive ductal carcinoma, grade 3, triple negative, with an MIB-1 of 70%.  (a) suspicious right axillary lymph node biopsied 01/28/2016 was benign  (1) neoadjuvant chemotherapy: doxorubicin and cyclophosphamide in dose dense fashion 4 started 04/14/16, completed 05/26/2016, followed by paclitaxel and carboplatin weekly 12, Started 06/09/2016  (a) taxol discontinued after 7 doses because of neuropathy, last dose 07/21/2016  (2) genetics testing October 20, 2016 through the 32-gene Comprehensive Cancer Panel offered by [facility] Junius Roads, MD) (with MSH2 Exons 1-7 Inversion Analysis) found no deleterious mutations or VUSS  In APC, ATM, AXIN2, BARD1, BMPR1A, BRCA1, BRCA2, BRIP1, CDH1, CDK4, CDKN2A, CHEK2, EPCAM, FANCC, MLH1, MSH2, MSH6, MUTYH, NBN, PALB2, PMS2, POLD1, POLE, PTEN, RAD51C, RAD51D, SCG5/GREM1, SMAD4, STK11, TP53, VHL, and XRCC2.    (3) right mastectomy and sentinel lymph node sampling 09/06/2016 showed a residual ypT1c ypN0, invasive ductal carcinoma, grade 3, with negative margins. Repeat prognostic panel again triple negative   (4) adjuvant radiation with capecitabine/Xeloda sensitization 11/15/16 - 01/12/17 Site/dose:   Right Chest Wall and axilla (4 field) treated to 45 Gy in 25 fractions, and then Boosted an additional 14.4 Gy in 8  fractions.  (5) tobacco abuse disorder: The patient quit smoking 04/04/2018  METASTATIC DISEASE:  July 2020 (6) nonspecific changes noted on chest CT 06/11/2019 were clarified by PET scan 06/19/2019 showing hypermetabolic disease in  the right anterior chest wall, right internal mammary nodes, right and left axillary nodes, but no metastatic disease in the neck, lungs, abdomen or pelvis.  Bone marrow uptake suggests bony metastatic disease.  (a) CARIS requested obtained from 06/28/2019 sample confirmed a triple negativity, the tumor also was negative for the androgen receptor, was MSI stable and mismatch repair status proficient, with a low mutational burden.  BRCA 1 and 2 were negative and PD-L1 was negative.  PI K3 showed a variant of uncertain significance.  However the tumor was genomic LOH high  (b) CA-27-29 is informative: was 118.3 on 06/04/2019  (7) zoledronate started 07/17/2019--on hold currently due to dental issues, and until 6 weeks at least after dental work  (8) carboplatin/ gemcitabine days 1 and 8 Q21 day cycle started 07/10/2019, last dose 10/30/2019  (a) restaging studies 09/2019 showed no progression  (b) restaging studies after 6 cycles showed mild disease progression  (9) cyclophosphamide, methotrexate, fluorouracil chemotherapy starting 11/13/2019, to be repeated every 21 days.   PLAN: Asra did not show for her second dose of CMF.  We will try to get that scheduled for this week.  She is concerned that there was no immediate change in the right chest wall area and that she found something in the left breast area.  We have previously biopsied the left axilla and documented cancer there so having a finding in the left breast itself is not going to change the [name] picture.  Nevertheless it is something to continue to follow by physical exam and future visits.  She requested "stronger chemo".  We certainly have that and we could change to that but I hate to give up on CMF  after only 1 cycle.  I would prefer 4 cycles but we are going to reassess when she returns to see me in about 2 weeks.  There has been no significant change after 2 cycles of CMF we will consider other alternatives, most likely every 3-week docetaxel  She knows to call for any other issue that may develop before the next visit  I spent 30 minutes total time in this encounter with Moishe Spice C. Betta Balla, MD 12/10/19 3:38 PM Medical Oncology and Hematology [facility] [place], [place] [address] Tel. [phone]    Fax. [phone]    I, Wilburn Mylar, am acting as scribe for Dr. Virgie [name]. Kamla Skilton.  I, Lurline Del MD, have reviewed the above documentation for accuracy and completeness, and I agree with the above.

## 2019-12-11 ENCOUNTER — Inpatient Hospital Stay: Payer: Medicaid Other

## 2019-12-11 ENCOUNTER — Other Ambulatory Visit: Payer: Self-pay | Admitting: *Deleted

## 2019-12-11 ENCOUNTER — Other Ambulatory Visit: Payer: Self-pay | Admitting: Oncology

## 2019-12-11 VITALS — BP 127/85 | HR 91 | Temp 97.3°F | Resp 16

## 2019-12-11 DIAGNOSIS — C7951 Secondary malignant neoplasm of bone: Secondary | ICD-10-CM

## 2019-12-11 DIAGNOSIS — C50919 Malignant neoplasm of unspecified site of unspecified female breast: Secondary | ICD-10-CM

## 2019-12-11 DIAGNOSIS — C50411 Malignant neoplasm of upper-outer quadrant of right female breast: Secondary | ICD-10-CM

## 2019-12-11 DIAGNOSIS — Z5111 Encounter for antineoplastic chemotherapy: Secondary | ICD-10-CM | POA: Diagnosis not present

## 2019-12-11 DIAGNOSIS — Z171 Estrogen receptor negative status [ER-]: Secondary | ICD-10-CM

## 2019-12-11 LAB — CBC WITH DIFFERENTIAL/PLATELET
Abs Immature Granulocytes: 0.04 10*3/uL (ref 0.00–0.07)
Basophils Absolute: 0 10*3/uL (ref 0.0–0.1)
Basophils Relative: 0 %
Eosinophils Absolute: 0.1 10*3/uL (ref 0.0–0.5)
Eosinophils Relative: 1 %
HCT: 31.2 % — ABNORMAL LOW (ref 36.0–46.0)
Hemoglobin: 10.3 g/dL — ABNORMAL LOW (ref 12.0–15.0)
Immature Granulocytes: 0 %
Lymphocytes Relative: 23 %
Lymphs Abs: 2.4 10*3/uL (ref 0.7–4.0)
MCH: 31 pg (ref 26.0–34.0)
MCHC: 33 g/dL (ref 30.0–36.0)
MCV: 94 fL (ref 80.0–100.0)
Monocytes Absolute: 0.8 10*3/uL (ref 0.1–1.0)
Monocytes Relative: 8 %
Neutro Abs: 7.2 10*3/uL (ref 1.7–7.7)
Neutrophils Relative %: 68 %
Platelets: 153 10*3/uL (ref 150–400)
RBC: 3.32 MIL/uL — ABNORMAL LOW (ref 3.87–5.11)
RDW: 18.8 % — ABNORMAL HIGH (ref 11.5–15.5)
WBC: 10.5 10*3/uL (ref 4.0–10.5)
nRBC: 0 % (ref 0.0–0.2)

## 2019-12-11 LAB — COMPREHENSIVE METABOLIC PANEL
ALT: 51 U/L — ABNORMAL HIGH (ref 0–44)
AST: 43 U/L — ABNORMAL HIGH (ref 15–41)
Albumin: 4.1 g/dL (ref 3.5–5.0)
Alkaline Phosphatase: 63 U/L (ref 38–126)
Anion gap: 11 (ref 5–15)
BUN: 12 mg/dL (ref 6–20)
CO2: 30 mmol/L (ref 22–32)
Calcium: 9.1 mg/dL (ref 8.9–10.3)
Chloride: 104 mmol/L (ref 98–111)
Creatinine, Ser: 0.96 mg/dL (ref 0.44–1.00)
GFR calc Af Amer: >60 mL/min (ref >60)
GFR calc non Af Amer: >60 mL/min (ref >60)
Glucose, Bld: 105 mg/dL — ABNORMAL HIGH (ref 70–99)
Potassium: 3.3 mmol/L — ABNORMAL LOW (ref 3.5–5.1)
Sodium: 145 mmol/L (ref 135–145)
Total Bilirubin: 0.3 mg/dL (ref 0.3–1.2)
Total Protein: 8 g/dL (ref 6.5–8.1)

## 2019-12-11 LAB — SAMPLE TO BLOOD BANK

## 2019-12-11 LAB — RETICULOCYTES
Immature Retic Fract: 15.2 % (ref 2.3–15.9)
RBC.: 3.32 MIL/uL — ABNORMAL LOW (ref 3.87–5.11)
Retic Count, Absolute: 39.8 10*3/uL (ref 19.0–186.0)
Retic Ct Pct: 1.2 % (ref 0.4–3.1)

## 2019-12-11 LAB — FERRITIN: Ferritin: 835 ng/mL — ABNORMAL HIGH (ref 11–307)

## 2019-12-11 MED ORDER — SODIUM CHLORIDE 0.9 % IV SOLN
Freq: Once | INTRAVENOUS | Status: AC
  Filled 2019-12-11: qty 250

## 2019-12-11 MED ORDER — PALONOSETRON HCL INJECTION 0.25 MG/5ML
0.2500 mg | Freq: Once | INTRAVENOUS | Status: AC
  Administered 2019-12-11: 0.25 mg via INTRAVENOUS

## 2019-12-11 MED ORDER — FLUOROURACIL CHEMO INJECTION 2.5 GM/50ML
600.0000 mg/m2 | Freq: Once | INTRAVENOUS | Status: AC
  Administered 2019-12-11: 1200 mg via INTRAVENOUS
  Filled 2019-12-11: qty 24

## 2019-12-11 MED ORDER — DEXAMETHASONE SODIUM PHOSPHATE 10 MG/ML IJ SOLN
10.0000 mg | Freq: Once | INTRAMUSCULAR | Status: AC
  Administered 2019-12-11: 10 mg via INTRAVENOUS

## 2019-12-11 MED ORDER — SODIUM CHLORIDE 0.9 % IV SOLN
600.0000 mg/m2 | Freq: Once | INTRAVENOUS | Status: AC
  Administered 2019-12-11: 1220 mg via INTRAVENOUS
  Filled 2019-12-11: qty 61

## 2019-12-11 MED ORDER — SODIUM CHLORIDE 0.9% FLUSH
10.0000 mL | INTRAVENOUS | Status: DC | PRN
  Administered 2019-12-11: 10 mL
  Filled 2019-12-11: qty 10

## 2019-12-11 MED ORDER — OMEPRAZOLE 20 MG PO CPDR: 20.0000 mg | DELAYED_RELEASE_CAPSULE | Freq: Every day | ORAL | 1 refills | Status: DC

## 2019-12-11 MED ORDER — HEPARIN SOD (PORK) LOCK FLUSH 100 UNIT/ML IV SOLN
500.0000 [IU] | Freq: Once | INTRAVENOUS | Status: AC | PRN
  Administered 2019-12-11: 500 [IU]
  Filled 2019-12-11: qty 5

## 2019-12-11 MED ORDER — PALONOSETRON HCL INJECTION 0.25 MG/5ML
INTRAVENOUS | Status: AC
  Filled 2019-12-11: qty 5

## 2019-12-11 MED ORDER — LORAZEPAM 0.5 MG PO TABS: 0.5000 mg | ORAL_TABLET | Freq: Every day | ORAL | 0 refills | Status: DC | PRN

## 2019-12-11 MED ORDER — TRAMADOL HCL 50 MG PO TABS: 50.0000 mg | ORAL_TABLET | Freq: Four times a day (QID) | ORAL | 0 refills | Status: DC | PRN

## 2019-12-11 MED ORDER — METHOTREXATE SODIUM (PF) CHEMO INJECTION 250 MG/10ML
39.4000 mg/m2 | Freq: Once | INTRAMUSCULAR | Status: AC
  Administered 2019-12-11: 80 mg via INTRAVENOUS
  Filled 2019-12-11: qty 3.2

## 2019-12-11 MED ORDER — DEXAMETHASONE SODIUM PHOSPHATE 10 MG/ML IJ SOLN
INTRAMUSCULAR | Status: AC
  Filled 2019-12-11: qty 1

## 2019-12-11 NOTE — Patient Instructions (Signed)
[facility] Discharge Instructions for Patients Receiving Chemotherapy  Today you received the following chemotherapy agents: Cytoxan, methotrexate, 5FU  To help prevent nausea and vomiting after your treatment, we encourage you to take your nausea medication as directed.   If you develop nausea and vomiting that is not controlled by your nausea medication, call the clinic.   BELOW ARE SYMPTOMS THAT SHOULD BE REPORTED IMMEDIATELY:  *FEVER GREATER THAN 100.5 F  *CHILLS WITH OR WITHOUT FEVER  NAUSEA AND VOMITING THAT IS NOT CONTROLLED WITH YOUR NAUSEA MEDICATION  *UNUSUAL SHORTNESS OF BREATH  *UNUSUAL BRUISING OR BLEEDING  TENDERNESS IN MOUTH AND THROAT WITH OR WITHOUT PRESENCE OF ULCERS  *URINARY PROBLEMS  *BOWEL PROBLEMS  UNUSUAL RASH Items with * indicate a potential emergency and should be followed up as soon as possible.  Feel free to call the clinic should you have any questions or concerns. The clinic phone number is [phone].  Please show the [facility] at check-in to the Emergency Department and triage nurse.  Cyclophosphamide injection What is this medicine? CYCLOPHOSPHAMIDE (sye kloe FOSS fa mide) is a chemotherapy drug. It slows the growth of cancer cells. This medicine is used to treat many types of cancer like lymphoma, myeloma, leukemia, breast cancer, and ovarian cancer, to name a few. This medicine may be used for other purposes; ask your health care provider or pharmacist if you have questions. COMMON BRAND NAME(S): Cytoxan, Neosar What should I tell my health care provider before I take this medicine? They need to know if you have any of these conditions:  blood disorders  history of other chemotherapy  infection  kidney disease  liver disease  recent or ongoing radiation therapy  tumors in the bone marrow  an unusual or allergic reaction to cyclophosphamide, other chemotherapy, other medicines, foods, dyes, or  preservatives  pregnant or trying to get pregnant  breast-feeding How should I use this medicine? This drug is usually given as an injection into a vein or muscle or by infusion into a vein. It is administered in a hospital or clinic by a specially trained health care professional. Talk to your pediatrician regarding the use of this medicine in children. Special care may be needed. Overdosage: If you think you have taken too much of this medicine contact a poison control center or emergency room at once. NOTE: This medicine is only for you. Do not share this medicine with others. What if I miss a dose? It is important not to miss your dose. Call your doctor or health care professional if you are unable to keep an appointment. What may interact with this medicine? This medicine may interact with the following medications:  amiodarone  amphotericin B  azathioprine  certain antiviral medicines for HIV or AIDS such as protease inhibitors (e.g., indinavir, ritonavir) and zidovudine  certain blood pressure medications such as benazepril, captopril, enalapril, fosinopril, lisinopril, moexipril, monopril, perindopril, quinapril, ramipril, trandolapril  certain cancer medications such as anthracyclines (e.g., daunorubicin, doxorubicin), busulfan, cytarabine, paclitaxel, pentostatin, tamoxifen, trastuzumab  certain diuretics such as chlorothiazide, chlorthalidone, hydrochlorothiazide, indapamide, metolazone  certain medicines that treat or prevent blood clots like warfarin  certain muscle relaxants such as succinylcholine  cyclosporine  etanercept  indomethacin  medicines to increase blood counts like filgrastim, pegfilgrastim, sargramostim  medicines used as general anesthesia  metronidazole  natalizumab This list may not describe all possible interactions. Give your health care provider a list of all the medicines, herbs, non-prescription drugs, or dietary supplements  you use.  Also tell them if you smoke, drink alcohol, or use illegal drugs. Some items may interact with your medicine. What should I watch for while using this medicine? Visit your doctor for checks on your progress. This drug may make you feel generally unwell. This is not uncommon, as chemotherapy can affect healthy cells as well as cancer cells. Report any side effects. Continue your course of treatment even though you feel ill unless your doctor tells you to stop. Drink water or other fluids as directed. Urinate often, even at night. In some cases, you may be given additional medicines to help with side effects. Follow all directions for their use. Call your doctor or health care professional for advice if you get a fever, chills or sore throat, or other symptoms of a cold or flu. Do not treat yourself. This drug decreases your body's ability to fight infections. Try to avoid being around people who are sick. This medicine may increase your risk to bruise or bleed. Call your doctor or health care professional if you notice any unusual bleeding. Be careful brushing and flossing your teeth or using a toothpick because you may get an infection or bleed more easily. If you have any dental work done, tell your dentist you are receiving this medicine. You may get drowsy or dizzy. Do not drive, use machinery, or do anything that needs mental alertness until you know how this medicine affects you. Do not become pregnant while taking this medicine or for 1 year after stopping it. Women should inform their doctor if they wish to become pregnant or think they might be pregnant. Men should not father a child while taking this medicine and for 4 months after stopping it. There is a potential for serious side effects to an unborn child. Talk to your health care professional or pharmacist for more information. Do not breast-feed an infant while taking this medicine. This medicine may interfere with the ability to have a  child. This medicine has caused ovarian failure in some women. This medicine has caused reduced sperm counts in some men. You should talk with your doctor or health care professional if you are concerned about your fertility. If you are going to have surgery, tell your doctor or health care professional that you have taken this medicine. What side effects may I notice from receiving this medicine? Side effects that you should report to your doctor or health care professional as soon as possible:  allergic reactions like skin rash, itching or hives, swelling of the face, lips, or tongue  low blood counts - this medicine may decrease the number of white blood cells, red blood cells and platelets. You may be at increased risk for infections and bleeding.  signs of infection - fever or chills, cough, sore throat, pain or difficulty passing urine  signs of decreased platelets or bleeding - bruising, pinpoint red spots on the skin, black, tarry stools, blood in the urine  signs of decreased red blood cells - unusually weak or tired, fainting spells, lightheadedness  breathing problems  dark urine  dizziness  palpitations  swelling of the ankles, feet, hands  trouble passing urine or change in the amount of urine  weight gain  yellowing of the eyes or skin Side effects that usually do not require medical attention (report to your doctor or health care professional if they continue or are bothersome):  changes in nail or skin color  hair loss  missed menstrual periods  mouth  sores  nausea, vomiting This list may not describe all possible side effects. Call your doctor for medical advice about side effects. You may report side effects to FDA at 1-800-FDA-1088. Where should I keep my medicine? This drug is given in a hospital or clinic and will not be stored at home. NOTE: This sheet is a summary. It may not cover all possible information. If you have questions about this medicine,  talk to your doctor, pharmacist, or health care provider.  2020 [employer]vier/Gold Standard (2012-10-05 16:22:58)  Methotrexate injection What is this medicine? METHOTREXATE (METH oh TREX ate) is a chemotherapy drug used to treat cancer including breast cancer, leukemia, and lymphoma. This medicine can also be used to treat psoriasis and certain kinds of arthritis. This medicine may be used for other purposes; ask your health care provider or pharmacist if you have questions. What should I tell my health care provider before I take this medicine? They need to know if you have any of these conditions:  fluid in the stomach area or lungs  if you often drink alcohol  infection or immune system problems  kidney disease  liver disease  low blood counts, like low white cell, platelet, or red cell counts  lung disease  radiation therapy  stomach ulcers  ulcerative colitis  an unusual or allergic reaction to methotrexate, other medicines, foods, dyes, or preservatives  pregnant or trying to get pregnant  breast-feeding How should I use this medicine? This medicine is for infusion into a vein or for injection into muscle or into the spinal fluid (whichever applies). It is usually given by a health care professional in a hospital or clinic setting. In rare cases, you might get this medicine at home. You will be taught how to give this medicine. Use exactly as directed. Take your medicine at regular intervals. Do not take your medicine more often than directed. If this medicine is used for arthritis or psoriasis, it should be taken weekly, NOT daily. It is important that you put your used needles and syringes in a special sharps container. Do not put them in a trash can. If you do not have a sharps container, call your pharmacist or healthcare provider to get one. Talk to your pediatrician regarding the use of this medicine in children. While this drug may be prescribed for children as young  as 2 years for selected conditions, precautions do apply. Overdosage: If you think you have taken too much of this medicine contact a poison control center or emergency room at once. NOTE: This medicine is only for you. Do not share this medicine with others. What if I miss a dose? It is important not to miss your dose. Call your doctor or health care professional if you are unable to keep an appointment. If you give yourself the medicine and you miss a dose, talk with your doctor or health care professional. Do not take double or extra doses. What may interact with this medicine? This medicine may interact with the following medications:  acitretin  aspirin or aspirin-like medicines including salicylates  azathioprine  certain antibiotics like chloramphenicol, penicillin, tetracycline  certain medicines for stomach problems like esomeprazole, omeprazole, pantoprazole  cyclosporine  gold  hydroxychloroquine  live virus vaccines  mercaptopurine  NSAIDs, medicines for pain and inflammation, like ibuprofen or naproxen  other cytotoxic agents  penicillamine  phenylbutazone  phenytoin  probenacid  retinoids such as isotretinoin and tretinoin  steroid medicines like prednisone or cortisone  sulfonamides like sulfasalazine  and trimethoprim/sulfamethoxazole  theophylline This list may not describe all possible interactions. Give your health care provider a list of all the medicines, herbs, non-prescription drugs, or dietary supplements you use. Also tell them if you smoke, drink alcohol, or use illegal drugs. Some items may interact with your medicine. What should I watch for while using this medicine? Avoid alcoholic drinks. In some cases, you may be given additional medicines to help with side effects. Follow all directions for their use. This medicine can make you more sensitive to the sun. Keep out of the sun. If you cannot avoid being in the sun, wear protective  clothing and use sunscreen. Do not use sun lamps or tanning beds/booths. You may get drowsy or dizzy. Do not drive, use machinery, or do anything that needs mental alertness until you know how this medicine affects you. Do not stand or sit up quickly, especially if you are an older patient. This reduces the risk of dizzy or fainting spells. You may need blood work done while you are taking this medicine. Call your doctor or health care professional for advice if you get a fever, chills or sore throat, or other symptoms of a cold or flu. Do not treat yourself. This drug decreases your body's ability to fight infections. Try to avoid being around people who are sick. This medicine may increase your risk to bruise or bleed. Call your doctor or health care professional if you notice any unusual bleeding. Check with your doctor or health care professional if you get an attack of severe diarrhea, nausea and vomiting, or if you sweat a lot. The loss of too much body fluid can make it dangerous for you to take this medicine. Talk to your doctor about your risk of cancer. You may be more at risk for certain types of cancers if you take this medicine. Both men and women must use effective birth control with this medicine. Do not become pregnant while taking this medicine or until at least 1 normal menstrual cycle has occurred after stopping it. Women should inform their doctor if they wish to become pregnant or think they might be pregnant. Men should not father a child while taking this medicine and for 3 months after stopping it. There is a potential for serious side effects to an unborn child. Talk to your health care professional or pharmacist for more information. Do not breast-feed an infant while taking this medicine. What side effects may I notice from receiving this medicine? Side effects that you should report to your doctor or health care professional as soon as possible:  allergic reactions like skin  rash, itching or hives, swelling of the face, lips, or tongue  back pain  breathing problems or shortness of breath  confusion  diarrhea  dry, nonproductive cough  low blood counts - this medicine may decrease the number of white blood cells, red blood cells and platelets. You may be at increased risk of infections and bleeding  mouth sores  redness, blistering, peeling or loosening of the skin, including inside the mouth  seizures  severe headaches  signs of infection - fever or chills, cough, sore throat, pain or difficulty passing urine  signs and symptoms of bleeding such as bloody or black, tarry stools; red or dark-brown urine; spitting up blood or brown material that looks like coffee grounds; red spots on the skin; unusual bruising or bleeding from the eye, gums, or nose  signs and symptoms of kidney injury like trouble passing  urine or change in the amount of urine  signs and symptoms of liver injury like dark yellow or brown urine; general ill feeling or flu-like symptoms; light-colored stools; loss of appetite; nausea; right upper belly pain; unusually weak or tired; yellowing of the eyes or skin  stiff neck  vomiting Side effects that usually do not require medical attention (report to your doctor or health care professional if they continue or are bothersome):  dizziness  hair loss  headache  stomach pain  upset stomach This list may not describe all possible side effects. Call your doctor for medical advice about side effects. You may report side effects to FDA at 1-800-FDA-1088. Where should I keep my medicine? If you are using this medicine at home, you will be instructed on how to store this medicine. Throw away any unused medicine after the expiration date on the label. NOTE: This sheet is a summary. It may not cover all possible information. If you have questions about this medicine, talk to your doctor, pharmacist, or health care provider.  2020  [employer]vier/Gold Standard (2017-07-13 13:31:42)  Fluorouracil, 5-FU injection What is this medicine? FLUOROURACIL, 5-FU (flure oh YOOR a sil) is a chemotherapy drug. It slows the growth of cancer cells. This medicine is used to treat many types of cancer like breast cancer, colon or rectal cancer, pancreatic cancer, and stomach cancer. This medicine may be used for other purposes; ask your health care provider or pharmacist if you have questions. COMMON BRAND NAME(S): Adrucil What should I tell my health care provider before I take this medicine? They need to know if you have any of these conditions:  blood disorders  dihydropyrimidine dehydrogenase (DPD) deficiency  infection (especially a virus infection such as chickenpox, cold sores, or herpes)  kidney disease  liver disease  malnourished, poor nutrition  recent or ongoing radiation therapy  an unusual or allergic reaction to fluorouracil, other chemotherapy, other medicines, foods, dyes, or preservatives  pregnant or trying to get pregnant  breast-feeding How should I use this medicine? This drug is given as an infusion or injection into a vein. It is administered in a hospital or clinic by a specially trained health care professional. Talk to your pediatrician regarding the use of this medicine in children. Special care may be needed. Overdosage: If you think you have taken too much of this medicine contact a poison control center or emergency room at once. NOTE: This medicine is only for you. Do not share this medicine with others. What if I miss a dose? It is important not to miss your dose. Call your doctor or health care professional if you are unable to keep an appointment. What may interact with this medicine?  allopurinol  cimetidine  dapsone  digoxin  hydroxyurea  leucovorin  levamisole  medicines for seizures like ethotoin, fosphenytoin, phenytoin  medicines to increase blood counts like filgrastim,  pegfilgrastim, sargramostim  medicines that treat or prevent blood clots like warfarin, enoxaparin, and dalteparin  methotrexate  metronidazole  pyrimethamine  some other chemotherapy drugs like busulfan, cisplatin, estramustine, vinblastine  trimethoprim  trimetrexate  vaccines Talk to your doctor or health care professional before taking any of these medicines:  acetaminophen  aspirin  ibuprofen  ketoprofen  naproxen This list may not describe all possible interactions. Give your health care provider a list of all the medicines, herbs, non-prescription drugs, or dietary supplements you use. Also tell them if you smoke, drink alcohol, or use illegal drugs. Some items may interact  with your medicine. What should I watch for while using this medicine? Visit your doctor for checks on your progress. This drug may make you feel generally unwell. This is not uncommon, as chemotherapy can affect healthy cells as well as cancer cells. Report any side effects. Continue your course of treatment even though you feel ill unless your doctor tells you to stop. In some cases, you may be given additional medicines to help with side effects. Follow all directions for their use. Call your doctor or health care professional for advice if you get a fever, chills or sore throat, or other symptoms of a cold or flu. Do not treat yourself. This drug decreases your body's ability to fight infections. Try to avoid being around people who are sick. This medicine may increase your risk to bruise or bleed. Call your doctor or health care professional if you notice any unusual bleeding. Be careful brushing and flossing your teeth or using a toothpick because you may get an infection or bleed more easily. If you have any dental work done, tell your dentist you are receiving this medicine. Avoid taking products that contain aspirin, acetaminophen, ibuprofen, naproxen, or ketoprofen unless instructed by your  doctor. These medicines may hide a fever. Do not become pregnant while taking this medicine. Women should inform their doctor if they wish to become pregnant or think they might be pregnant. There is a potential for serious side effects to an unborn child. Talk to your health care professional or pharmacist for more information. Do not breast-feed an infant while taking this medicine. Men should inform their doctor if they wish to father a child. This medicine may lower sperm counts. Do not treat diarrhea with over the counter products. Contact your doctor if you have diarrhea that lasts more than 2 days or if it is severe and watery. This medicine can make you more sensitive to the sun. Keep out of the sun. If you cannot avoid being in the sun, wear protective clothing and use sunscreen. Do not use sun lamps or tanning beds/booths. What side effects may I notice from receiving this medicine? Side effects that you should report to your doctor or health care professional as soon as possible:  allergic reactions like skin rash, itching or hives, swelling of the face, lips, or tongue  low blood counts - this medicine may decrease the number of white blood cells, red blood cells and platelets. You may be at increased risk for infections and bleeding.  signs of infection - fever or chills, cough, sore throat, pain or difficulty passing urine  signs of decreased platelets or bleeding - bruising, pinpoint red spots on the skin, black, tarry stools, blood in the urine  signs of decreased red blood cells - unusually weak or tired, fainting spells, lightheadedness  breathing problems  changes in vision  chest pain  mouth sores  nausea and vomiting  pain, swelling, redness at site where injected  pain, tingling, numbness in the hands or feet  redness, swelling, or sores on hands or feet  stomach pain  unusual bleeding Side effects that usually do not require medical attention (report to  your doctor or health care professional if they continue or are bothersome):  changes in finger or toe nails  diarrhea  dry or itchy skin  hair loss  headache  loss of appetite  sensitivity of eyes to the light  stomach upset  unusually teary eyes This list may not describe all possible side effects.  Call your doctor for medical advice about side effects. You may report side effects to FDA at 1-800-FDA-1088. Where should I keep my medicine? This drug is given in a hospital or clinic and will not be stored at home. NOTE: This sheet is a summary. It may not cover all possible information. If you have questions about this medicine, talk to your doctor, pharmacist, or health care provider.  2020 [employer]vier/Gold Standard (2008-03-26 13:53:16)

## 2019-12-12 LAB — CANCER ANTIGEN 27.29: CA 27.29: 291.2 U/mL — ABNORMAL HIGH (ref 0.0–38.6)

## 2019-12-20 ENCOUNTER — Other Ambulatory Visit: Payer: Self-pay | Admitting: *Deleted

## 2019-12-25 ENCOUNTER — Other Ambulatory Visit: Payer: Medicaid Other

## 2019-12-25 ENCOUNTER — Ambulatory Visit: Payer: Medicaid Other | Admitting: Oncology

## 2019-12-25 ENCOUNTER — Ambulatory Visit: Payer: Medicaid Other

## 2019-12-25 NOTE — Progress Notes (Signed)
[place]  [phone]) [phone] [phone]) [phone]   ID: [name] DOB: 1976/02/12  MR#: [id number]  JQB#:[id number]  Patient Care Team: Rebecca Dew, FNP as PCP - General (Family Medicine) Rebecca Overall, MD as Consulting Physician (General Surgery) Rebecca [name], Rebecca Dad, MD as Consulting Physician (Oncology) Rebecca Pray, MD as Consulting Physician (Radiation Oncology) Rebecca [name], Rebecca Massed, NP as Nurse Practitioner (Hematology and Oncology) Rebecca Berthold, DO as Consulting Physician (Neurology) OTHER MD:  CHIEF COMPLAINT: triple negative stage IV breast cancer  CURRENT TREATMENT: CMF chemotherapy; [zoledronate]   INTERVAL HISTORY: Rebecca [name] returns today for follow-up and treatment of he triple negative breast cancer.  She has had 2 doses of CMF chemotherapy, with a 1 weeks delay in his second dose of (not clear of why she rescheduled).  After the first dose she was very concerned that there had been no obvious improvement.  We decided we would take a look after the second dose and that is why she is here today.   REVIEW OF SYSTEMS: Rebecca [name] continues to work but is feeling tired.  She had minimal mouth sores with the chemo.  Otherwise she has tolerated treatment remarkably well.  She thinks there may have been a little bit of improvement on her chest wall lesion but is not sure.  A detailed review of systems today was otherwise stable   BREAST CANCER HISTORY: From the original intake note:  Rebecca [name] noted a change in her right breast sometime around September or October 2016. She did not bring it to intermediate medical attention, but on 01/13/2016 she established [name] in Rebecca [name]' service and she was set up for bilateral diagnostic mammography with tomosynthesis and bilateral ultrasonography at the [facility] 01/19/2016. The breast density was category B. In the upper outer quadrant of the right breast there was a spiculated mass measuring 2.8 cm.  On physical exam this was palpable. Targeted ultrasonography confirmed an irregular hypoechoic mass in the right breast 11:30 o'clock position measuring 2.6 cm maximally. Ultrasound of the right axilla showed a morphologically abnormal lymph node.  In the left breast there were some tubular densities behind the areola which by ultrasonography showed benign ductal ectasia.  On 01/28/2016 Rebecca [name] underwent biopsy of the right breast mass and abnormal right axillary lymph node. The pathology from this procedure (S AAA [phone] [phone]) showed the lymph node to be benign. In the breast however there was an invasive ductal carcinoma, grade 3, which was estrogen and progesterone receptor negative. The proliferation marker was 70%. HER-2 was not amplified with a signals ratio of 1.32. The number per cell was 2.05.  The patient's subsequent history is as detailed below   PAST MEDICAL HISTORY: Past Medical History:  Diagnosis Date  . Anxiety   . Breast cancer ([place])   . Depression   . GERD (gastroesophageal reflux disease)   . History of radiation therapy 11/15/16-01/12/17   right chest wall and axilla treated to 45 Gy in 25 fractions, boosted and additional 14 Gy in 8 fractions  . Hypertension    diet controlled  . Obesity (BMI 35.0-39.9 without comorbidity)   . Pneumonia    as a child  . Seasonal allergies   . Sickle cell trait ([place])   . Termination of pregnancy (fetus) 04/02/16    PAST SURGICAL HISTORY: Past Surgical History:  Procedure Laterality Date  . CESAREAN SECTION     2004 and 2007  . MASTECTOMY W/ SENTINEL NODE BIOPSY Right 09/06/2016   Procedure:  RIGHT BREAST MASTECTOMY WITH RIGHT AXILLARY SENTINEL LYMPH NODE BIOPSY;  Surgeon: Rebecca Overall, MD;  Location: [facility];  Service: General;  Laterality: Right;  . PORT-A-CATH REMOVAL Left 09/06/2016   Procedure: REMOVAL PORT-A-CATH;  Surgeon: Rebecca Overall, MD;  Location: [facility];  Service: General;  Laterality: Left;   . PORTACATH PLACEMENT    . PORTACATH PLACEMENT N/A 07/09/2019   Procedure: INSERTION PORT-A-CATH WITH ULTRASOUND;  Surgeon: Rebecca Overall, MD;  Location[facility] OR;  Service: General;  Laterality: N/A;    FAMILY HISTORY Family History  Problem Relation Age of Onset  . Hypertension Mother   . Cancer Mother        dx "intestinal cancer" in her 31s; +surgery  . Other Mother        hysterectomy at young age for unspecified cause  . Heart Problems Mother   . Breast cancer Cousin        maternal 1st cousin dx female breast cancer at 27-46y  . Cancer Father   . Hypertension Father   . Heart Problems Maternal Aunt   . Diabetes Maternal Aunt   . Breast cancer Maternal Uncle        dx 64-65  . Heart Problems Maternal Uncle   . Breast cancer Maternal Grandmother 37  . Throat cancer Maternal Grandfather        d. 53s; smoker  . Sickle cell anemia Paternal Aunt   . Congestive Heart Failure Maternal Aunt   . Multiple sclerosis Cousin   . Cancer Other        maternal great uncle (MGM's brother); cancer removed from his side  . Heart attack Paternal Aunt        d. early 54s  The patient has very little information about her father. Her mother is currently 55 years old. She had a history of cervical cancer at age 55. The patient had 2 brothers, no sisters. The maternal grandfather had throat cancer. A maternal uncle was diagnosed with breast cancer as well as prostate cancer at the age of 41. 2 maternal cousins, one of them female, had breast cancer as well.   GYNECOLOGIC HISTORY:  No LMP recorded. Menarche age 23, first live birth age 6. The patient is GX P4. She was still having regular periods at the time of diagnosis. She took oral contraceptives in the 1990s with no side effects.--.  Stopped with chemotherapy and have not resumed as of May 2019   SOCIAL HISTORY:  (Updated 10/2018) She works as a Market researcher. The patient's significant other Rebecca [name] works at [employer].  At  home with the patient are her 3 children Rebecca [name], Rebecca [name] and Rebecca [name]. There are age 8, 34, and 17 as of November 2019.  2 of them are disabled or have significant health problems, one with sickle cell disease, the other with autism and developmental delay.  The patient's son [name], currently 78 years old, lives in [place].    ADVANCED DIRECTIVES: Not in place   HEALTH MAINTENANCE: Social History   Tobacco Use  . Smoking status: Former Smoker    Packs/day: 1.00    Years: 20.00    Pack years: 20.00    Types: Cigarettes    Quit date: 04/01/2018    Years since quitting: 1.7  . Smokeless tobacco: Never Used  . Tobacco comment: Patient has quit smoking x 1 year now  Substance Use Topics  . Alcohol use: Yes  Comment: occ  . Drug use: No    Colonoscopy:  PAP:  Bone density:  Lipid panel:  No Known Allergies  Current Outpatient Medications on File Prior to Visit  Medication Sig Dispense Refill  . calcium carbonate (TUMS - DOSED IN MG ELEMENTAL CALCIUM) 500 MG chewable tablet Chew 1 tablet by mouth daily as needed for indigestion or heartburn.    Marland Kitchen LORazepam (ATIVAN) 0.5 MG tablet Take 1 tablet (0.5 mg total) by mouth daily as needed for anxiety. 30 tablet 0  . omeprazole (PRILOSEC) 20 MG capsule Take 1 capsule (20 mg total) by mouth daily. 30 capsule 1  . traMADol (ULTRAM) 50 MG tablet Take 1 tablet (50 mg total) by mouth every 6 (six) hours as needed. 30 tablet 0  . [DISCONTINUED] prochlorperazine (COMPAZINE) 10 MG tablet Take 1 tablet (10 mg total) by mouth every 6 (six) hours as needed (Nausea or vomiting). 30 tablet 1   No current facility-administered medications on file prior to visit.    OBJECTIVE: Young African-American [name] in no acute distress  Vitals:   12/26/19 1455  BP: 135/84  Pulse: 97  Resp: 18  Temp: 97.9 F (36.6 C)  SpO2: 100%   Wt Readings from Last 3 Encounters:  12/26/19 200 lb 3.2 oz (90.8 kg)  12/10/19 201  lb 1.6 oz (91.2 kg)  11/22/19 208 lb (94.3 kg)   Body mass index is 37.21 kg/m.    ECOG FS:1 - Symptomatic but completely ambulatory  Sclerae unicteric, EOMs intact Wearing a mask No cervical or supraclavicular adenopathy Lungs no rales or rhonchi Heart regular rate and rhythm Abd soft, nontender, positive bowel sounds MSK no focal spinal tenderness, no upper extremity lymphedema Neuro: nonfocal, well oriented, appropriate affect Breasts: The right breast is status post mastectomy with local recurrence as imaged below.  Right chest wall 12/26/2019:     Right chest wall area 11/22/2019, which is the new baseline   LAB RESULTS: CMP Latest Ref Rng & Units 12/11/2019 11/21/2019 11/13/2019  Glucose 70 - 99 mg/dL 105(H) 106(H) 111(H)  BUN 6 - 20 mg/dL 12 10 7   Creatinine 0.44 - 1.00 mg/dL 0.96 0.91 0.86  Sodium 135 - 145 mmol/L 145 141 142  Potassium 3.5 - 5.1 mmol/L 3.3(L) 3.7 3.3(L)  Chloride 98 - 111 mmol/L 104 103 105  CO2 22 - 32 mmol/L 30 27 29   Calcium 8.9 - 10.3 mg/dL 9.1 9.2 8.5(L)  Total Protein 6.5 - 8.1 g/dL 8.0 8.1 7.6  Total Bilirubin 0.3 - 1.2 mg/dL 0.3 0.4 0.6  Alkaline Phos 38 - 126 U/L 63 64 66  AST 15 - 41 U/L 43(H) 19 44(H)  ALT 0 - 44 U/L 51(H) 17 46(H)     CBC    Component Value Date/Time   WBC 10.5 12/11/2019 1341   RBC 3.32 (L) 12/11/2019 1341   RBC 3.32 (L) 12/11/2019 1340   HGB 10.3 (L) 12/11/2019 1341   HGB 6.3 (LL) 11/08/2019 1350   HGB 10.4 (L) 10/04/2016 1154   HCT 31.2 (L) 12/11/2019 1341   HCT 31.3 (L) 10/04/2016 1154   PLT 153 12/11/2019 1341   PLT 10 (L) 11/08/2019 1350   PLT 320 10/04/2016 1154   MCV 94.0 12/11/2019 1341   MCV 95.7 10/04/2016 1154   MCH 31.0 12/11/2019 1341   MCHC 33.0 12/11/2019 1341   RDW 18.8 (H) 12/11/2019 1341   RDW 16.3 (H) 10/04/2016 1154   LYMPHSABS 2.4 12/11/2019 1341   LYMPHSABS 1.7 10/04/2016 1154  MONOABS 0.8 12/11/2019 1341   MONOABS 0.5 10/04/2016 1154   EOSABS 0.1 12/11/2019 1341   EOSABS  0.3 10/04/2016 1154   BASOSABS 0.0 12/11/2019 1341   BASOSABS 0.0 10/04/2016 1154    STUDIES: No results found.  RESEARCH: Referred to PREVENT study, but was pregnant at the time; referred to [facility] 11202, but the biopsied lymph node was benign; referred to weight loss study but declined; referred to [facility] 12/09/2000, but the timing of radiation and 8 was greater than 60 days past the date of diagnosis; referred to health disparity study, but declined; referred to [id number] adjuvant therapy study, but the patient received Xeloda with radiation and therefore was ineligible   ASSESSMENT: 44 y.o. Rebecca [name] status post right breast upper-outer quadrant biopsy 01/28/2016 for a clinical T2 N0 invasive ductal carcinoma, grade 3, triple negative, with an MIB-1 of 70%.  (a) suspicious right axillary lymph node biopsied 01/28/2016 was benign  (1) neoadjuvant chemotherapy: doxorubicin and cyclophosphamide in dose dense fashion 4 started 04/14/16, completed 05/26/2016, followed by paclitaxel and carboplatin weekly 12, Started 06/09/2016  (a) taxol discontinued after 7 doses because of neuropathy, last dose 07/21/2016  (2) genetics testing October 20, 2016 through the 32-gene Comprehensive Cancer Panel offered by [facility] Junius Roads, MD) (with MSH2 Exons 1-7 Inversion Analysis) found no deleterious mutations or VUSS  In APC, ATM, AXIN2, BARD1, BMPR1A, BRCA1, BRCA2, BRIP1, CDH1, CDK4, CDKN2A, CHEK2, EPCAM, FANCC, MLH1, MSH2, MSH6, MUTYH, NBN, PALB2, PMS2, POLD1, POLE, PTEN, RAD51C, RAD51D, SCG5/GREM1, SMAD4, STK11, TP53, VHL, and XRCC2.    (3) right mastectomy and sentinel lymph node sampling 09/06/2016 showed a residual ypT1c ypN0, invasive ductal carcinoma, grade 3, with negative margins. Repeat prognostic panel again triple negative   (4) adjuvant radiation with capecitabine/Xeloda sensitization 11/15/16 - 01/12/17 Site/dose:   Right Chest Wall and axilla (4 field) treated to  45 Gy in 25 fractions, and then Boosted an additional 14.4 Gy in 8 fractions.  (5) tobacco abuse disorder: The patient quit smoking 04/04/2018  METASTATIC DISEASE:  July 2020 (6) nonspecific changes noted on chest CT 06/11/2019 were clarified by PET scan 06/19/2019 showing hypermetabolic disease in the right anterior chest wall, right internal mammary nodes, right and left axillary nodes, but no metastatic disease in the neck, lungs, abdomen or pelvis.  Bone marrow uptake suggests bony metastatic disease.  (a) CARIS requested obtained from 06/28/2019 sample confirmed a triple negativity, the tumor also was negative for the androgen receptor, was MSI stable and mismatch repair status proficient, with a low mutational burden.  BRCA 1 and 2 were negative and PD-L1 was negative.  PI K3 showed a variant of uncertain significance.  However the tumor was genomic LOH high  (b) CA-27-29 is informative: was 118.3 on 06/04/2019  (7) zoledronate started 07/17/2019--on hold currently due to dental issues, and until 6 weeks at least after dental work  (8) carboplatin/ gemcitabine days 1 and 8 Q21 day cycle started 07/10/2019, last dose 10/30/2019  (a) restaging studies 09/2019 showed no progression  (b) restaging studies after 6 cycles showed mild disease progression  (9) cyclophosphamide, methotrexate, fluorouracil chemotherapy started 11/13/2019, repeated every 21 days.   PLAN: Rebecca [name] is tolerating CMF generally well.  She is having some mouth sores.  I am writing for valacyclovir for her to take daily.  That should suppress that issue.  I do not see any evidence of response in fact there may have been a slight worsening.  However CMF is not the most aggressive  chemo and I would prefer to proceed through cycle #3.  That is scheduled for next week.  She then will return to see me about 10 days after that.  There has not been any evidence of the area in question "drying up" we will switch to every 3-week  [place].  Total encounter time 25 minutes.Sarajane Jews C. Aulden Calise, MD 12/26/19 5:00 PM Medical Oncology and Hematology [facility] [place], [place] [address] Tel. [phone]    Fax. [phone]    I, Wilburn Mylar, am acting as scribe for Dr. Virgie [name]. Faten Frieson.  I, Lurline Del MD, have reviewed the above documentation for accuracy and completeness, and I agree with the above.   *Total Encounter Time as defined by the Centers for Medicare and Medicaid Services includes, in addition to the face-to-face time of a patient visit (documented in the note above) non-face-to-face time: obtaining and reviewing outside history, ordering and reviewing medications, tests or procedures, care coordination (communications with other health care professionals or caregivers) and documentation in the medical record.

## 2019-12-26 ENCOUNTER — Inpatient Hospital Stay (HOSPITAL_BASED_OUTPATIENT_CLINIC_OR_DEPARTMENT_OTHER): Payer: Medicaid Other | Admitting: Oncology

## 2019-12-26 ENCOUNTER — Other Ambulatory Visit: Payer: Self-pay

## 2019-12-26 VITALS — BP 135/84 | HR 97 | Temp 97.9°F | Resp 18 | Ht 61.5 in | Wt 200.2 lb

## 2019-12-26 DIAGNOSIS — C50919 Malignant neoplasm of unspecified site of unspecified female breast: Secondary | ICD-10-CM

## 2019-12-26 DIAGNOSIS — Z171 Estrogen receptor negative status [ER-]: Secondary | ICD-10-CM

## 2019-12-26 DIAGNOSIS — C7951 Secondary malignant neoplasm of bone: Secondary | ICD-10-CM

## 2019-12-26 DIAGNOSIS — Z7189 Other specified counseling: Secondary | ICD-10-CM

## 2019-12-26 DIAGNOSIS — G620 Drug-induced polyneuropathy: Secondary | ICD-10-CM | POA: Diagnosis not present

## 2019-12-26 DIAGNOSIS — T451X5A Adverse effect of antineoplastic and immunosuppressive drugs, initial encounter: Secondary | ICD-10-CM

## 2019-12-26 DIAGNOSIS — Z6841 Body Mass Index (BMI) 40.0 and over, adult: Secondary | ICD-10-CM | POA: Diagnosis not present

## 2019-12-26 DIAGNOSIS — C50411 Malignant neoplasm of upper-outer quadrant of right female breast: Secondary | ICD-10-CM | POA: Diagnosis not present

## 2019-12-26 DIAGNOSIS — Z5111 Encounter for antineoplastic chemotherapy: Secondary | ICD-10-CM | POA: Diagnosis not present

## 2019-12-26 MED ORDER — VALACYCLOVIR HCL 1 G PO TABS: 1000.0000 mg | ORAL_TABLET | Freq: Every day | ORAL | 1 refills | Status: DC

## 2019-12-26 MED FILL — valACYclovir HCL 1 GM TABS: 1 | 30 days supply | Qty: 30 | Fill #0

## 2019-12-27 ENCOUNTER — Telehealth: Payer: Self-pay | Admitting: Oncology

## 2019-12-27 NOTE — Telephone Encounter (Signed)
I talk with patient regarding schedule  

## 2019-12-30 ENCOUNTER — Other Ambulatory Visit: Payer: Self-pay | Admitting: Oncology

## 2019-12-31 ENCOUNTER — Inpatient Hospital Stay: Payer: Medicaid Other | Admitting: Oncology

## 2019-12-31 ENCOUNTER — Inpatient Hospital Stay: Payer: Medicaid Other

## 2019-12-31 ENCOUNTER — Other Ambulatory Visit: Payer: Self-pay

## 2019-12-31 VITALS — BP 113/72 | HR 96 | Temp 97.7°F | Resp 16

## 2019-12-31 DIAGNOSIS — Z171 Estrogen receptor negative status [ER-]: Secondary | ICD-10-CM

## 2019-12-31 DIAGNOSIS — Z23 Encounter for immunization: Secondary | ICD-10-CM

## 2019-12-31 DIAGNOSIS — Z5111 Encounter for antineoplastic chemotherapy: Secondary | ICD-10-CM | POA: Diagnosis not present

## 2019-12-31 DIAGNOSIS — C50919 Malignant neoplasm of unspecified site of unspecified female breast: Secondary | ICD-10-CM

## 2019-12-31 DIAGNOSIS — C7951 Secondary malignant neoplasm of bone: Secondary | ICD-10-CM

## 2019-12-31 DIAGNOSIS — C50411 Malignant neoplasm of upper-outer quadrant of right female breast: Secondary | ICD-10-CM

## 2019-12-31 LAB — COMPREHENSIVE METABOLIC PANEL
ALT: 18 U/L (ref 0–44)
AST: 20 U/L (ref 15–41)
Albumin: 4.1 g/dL (ref 3.5–5.0)
Alkaline Phosphatase: 72 U/L (ref 38–126)
Anion gap: 11 (ref 5–15)
BUN: 17 mg/dL (ref 6–20)
CO2: 27 mmol/L (ref 22–32)
Calcium: 9.2 mg/dL (ref 8.9–10.3)
Chloride: 104 mmol/L (ref 98–111)
Creatinine, Ser: 1.12 mg/dL — ABNORMAL HIGH (ref 0.44–1.00)
GFR calc Af Amer: >60 mL/min (ref >60)
GFR calc non Af Amer: 59 mL/min — ABNORMAL LOW (ref >60)
Glucose, Bld: 86 mg/dL (ref 70–99)
Potassium: 3.6 mmol/L (ref 3.5–5.1)
Sodium: 142 mmol/L (ref 135–145)
Total Bilirubin: 0.2 mg/dL — ABNORMAL LOW (ref 0.3–1.2)
Total Protein: 8.1 g/dL (ref 6.5–8.1)

## 2019-12-31 LAB — CBC WITH DIFFERENTIAL/PLATELET
Abs Immature Granulocytes: 0.05 10*3/uL (ref 0.00–0.07)
Basophils Absolute: 0 10*3/uL (ref 0.0–0.1)
Basophils Relative: 0 %
Eosinophils Absolute: 0.1 10*3/uL (ref 0.0–0.5)
Eosinophils Relative: 2 %
HCT: 28.9 % — ABNORMAL LOW (ref 36.0–46.0)
Hemoglobin: 9.8 g/dL — ABNORMAL LOW (ref 12.0–15.0)
Immature Granulocytes: 1 %
Lymphocytes Relative: 30 %
Lymphs Abs: 2.1 10*3/uL (ref 0.7–4.0)
MCH: 31.4 pg (ref 26.0–34.0)
MCHC: 33.9 g/dL (ref 30.0–36.0)
MCV: 92.6 fL (ref 80.0–100.0)
Monocytes Absolute: 1 10*3/uL (ref 0.1–1.0)
Monocytes Relative: 14 %
Neutro Abs: 3.7 10*3/uL (ref 1.7–7.7)
Neutrophils Relative %: 53 %
Platelets: 116 10*3/uL — ABNORMAL LOW (ref 150–400)
RBC: 3.12 MIL/uL — ABNORMAL LOW (ref 3.87–5.11)
RDW: 18.6 % — ABNORMAL HIGH (ref 11.5–15.5)
WBC: 6.9 10*3/uL (ref 4.0–10.5)
nRBC: 0 % (ref 0.0–0.2)

## 2019-12-31 LAB — RETICULOCYTES
Immature Retic Fract: 24 % — ABNORMAL HIGH (ref 2.3–15.9)
RBC.: 3.12 MIL/uL — ABNORMAL LOW (ref 3.87–5.11)
Retic Count, Absolute: 59.3 10*3/uL (ref 19.0–186.0)
Retic Ct Pct: 1.9 % (ref 0.4–3.1)

## 2019-12-31 LAB — SAMPLE TO BLOOD BANK

## 2019-12-31 LAB — FERRITIN: Ferritin: 802 ng/mL — ABNORMAL HIGH (ref 11–307)

## 2019-12-31 MED ORDER — HEPARIN SOD (PORK) LOCK FLUSH 100 UNIT/ML IV SOLN
500.0000 [IU] | Freq: Once | INTRAVENOUS | Status: AC | PRN
  Administered 2019-12-31: 500 [IU]
  Filled 2019-12-31: qty 5

## 2019-12-31 MED ORDER — PALONOSETRON HCL INJECTION 0.25 MG/5ML
0.2500 mg | Freq: Once | INTRAVENOUS | Status: AC
  Administered 2019-12-31: 0.25 mg via INTRAVENOUS

## 2019-12-31 MED ORDER — SODIUM CHLORIDE 0.9% FLUSH
10.0000 mL | INTRAVENOUS | Status: DC | PRN
  Administered 2019-12-31: 10 mL
  Filled 2019-12-31: qty 10

## 2019-12-31 MED ORDER — INFLUENZA VAC SPLIT QUAD 0.5 ML IM SUSY: 0.5000 mL | PREFILLED_SYRINGE | Freq: Once | INTRAMUSCULAR | Status: DC

## 2019-12-31 MED ORDER — PALONOSETRON HCL INJECTION 0.25 MG/5ML
INTRAVENOUS | Status: AC
  Filled 2019-12-31: qty 5

## 2019-12-31 MED ORDER — METHOTREXATE SODIUM (PF) CHEMO INJECTION 250 MG/10ML
39.4000 mg/m2 | Freq: Once | INTRAMUSCULAR | Status: AC
  Administered 2019-12-31: 80 mg via INTRAVENOUS
  Filled 2019-12-31: qty 3.2

## 2019-12-31 MED ORDER — DEXAMETHASONE SODIUM PHOSPHATE 10 MG/ML IJ SOLN
10.0000 mg | Freq: Once | INTRAMUSCULAR | Status: AC
  Administered 2019-12-31: 10 mg via INTRAVENOUS

## 2019-12-31 MED ORDER — FLUOROURACIL CHEMO INJECTION 2.5 GM/50ML
600.0000 mg/m2 | Freq: Once | INTRAVENOUS | Status: AC
  Administered 2019-12-31: 1200 mg via INTRAVENOUS
  Filled 2019-12-31: qty 24

## 2019-12-31 MED ORDER — DEXAMETHASONE SODIUM PHOSPHATE 10 MG/ML IJ SOLN
INTRAMUSCULAR | Status: AC
  Filled 2019-12-31: qty 1

## 2019-12-31 MED ORDER — SODIUM CHLORIDE 0.9 % IV SOLN
Freq: Once | INTRAVENOUS | Status: AC
  Filled 2019-12-31: qty 250

## 2019-12-31 MED ORDER — SODIUM CHLORIDE 0.9 % IV SOLN
600.0000 mg/m2 | Freq: Once | INTRAVENOUS | Status: AC
  Administered 2019-12-31: 1220 mg via INTRAVENOUS
  Filled 2019-12-31: qty 61

## 2019-12-31 NOTE — Patient Instructions (Signed)

## 2019-12-31 NOTE — Patient Instructions (Signed)
[facility] Discharge Instructions for Patients Receiving Chemotherapy  Today you received the following chemotherapy agents: cyclophosphamide, methotrexate, and fluorouracil.  To help prevent nausea and vomiting after your treatment, we encourage you to take your nausea medication as directed.   If you develop nausea and vomiting that is not controlled by your nausea medication, call the clinic.   BELOW ARE SYMPTOMS THAT SHOULD BE REPORTED IMMEDIATELY:  *FEVER GREATER THAN 100.5 F  *CHILLS WITH OR WITHOUT FEVER  NAUSEA AND VOMITING THAT IS NOT CONTROLLED WITH YOUR NAUSEA MEDICATION  *UNUSUAL SHORTNESS OF BREATH  *UNUSUAL BRUISING OR BLEEDING  TENDERNESS IN MOUTH AND THROAT WITH OR WITHOUT PRESENCE OF ULCERS  *URINARY PROBLEMS  *BOWEL PROBLEMS  UNUSUAL RASH Items with * indicate a potential emergency and should be followed up as soon as possible.  Feel free to call the clinic should you have any questions or concerns. The clinic phone number is [phone].  Please show the CHEMO ALERT CARD at check-in to the Emergency Department and triage nurse.   

## 2020-01-01 ENCOUNTER — Ambulatory Visit (HOSPITAL_BASED_OUTPATIENT_CLINIC_OR_DEPARTMENT_OTHER): Payer: Medicaid Other | Admitting: Medical

## 2020-01-01 DIAGNOSIS — Z171 Estrogen receptor negative status [ER-]: Secondary | ICD-10-CM

## 2020-01-01 DIAGNOSIS — C50411 Malignant neoplasm of upper-outer quadrant of right female breast: Secondary | ICD-10-CM

## 2020-01-01 LAB — CANCER ANTIGEN 27.29: CA 27.29: 304.4 U/mL — ABNORMAL HIGH (ref 0.0–38.6)

## 2020-01-02 NOTE — Progress Notes (Signed)
The patient was seen in the infusion room today as she was receiving CMF. She reported that she multiple pinpoint slightly raised non-pruritic lesions over her neck and upper chest. An exam confirmed this finding. She was told to use OTC hydrocortisone cream as needed.  Sandi Mealy, MHS, PA-C Physician Assistant

## 2020-01-13 NOTE — Progress Notes (Signed)
Rebecca [name]  [phone] [phone]   ID: Unknown Rebecca [name] DOB: [birth year]  MR#: [id number]  ZHY#:[id number]  Patient Care Team: Dorena Dew, FNP as PCP - General (Family Medicine) Alphonsa Overall, MD as Consulting Physician (General Surgery) Ilias Stcharles, [name], MD as Consulting Physician (Oncology) Gery Pray, MD as Consulting Physician (Radiation Oncology) Delice Bison, Charlestine Massed, NP as Nurse Practitioner (Hematology and Oncology) Alda Berthold, DO as Consulting Physician (Neurology) OTHER MD:  CHIEF COMPLAINT: triple negative stage IV breast cancer  CURRENT TREATMENT: To start capecitabine; [zoledronate]   INTERVAL HISTORY: December returns today for follow-up and treatment of he triple negative breast cancer.    She has had 3 doses of CMF chemotherapy.  After the first dose she was very concerned that there had been no obvious improvement. We discussed this at her last visit on 12/26/2019. If there is no evidence of the area in question "drying up," we will switch to every 3-week Taxotere.  That unfortunately is what is happened.  There has been no significant change in the chest lesion we are following.  In fact there has been obvious progression as discussed below.  REVIEW OF SYSTEMS: Rebecca [name] generally is doing well.  She continues to work as a Actuary.  She denies pain, fever or unusual headaches, visual changes, nausea, vomiting, mouth sores, or change in bowel or bladder habits.  Her port has worked well.  A detailed review of systems was otherwise stable.   BREAST CANCER HISTORY: From the original intake note:  Rebecca [name] herself noted a change in her right breast sometime around September or October 2016. She did not bring it to intermediate medical attention, but on 01/13/2016 she established herself in Dr. Smith Robert' service and she was set up for bilateral diagnostic mammography with tomosynthesis and bilateral ultrasonography at the [facility] 01/19/2016. The breast density was category B. In the upper outer quadrant of the right breast there was a spiculated mass measuring 2.8 cm. On physical exam this was palpable. Targeted ultrasonography confirmed an irregular hypoechoic mass in the right breast 11:30 o'clock position measuring 2.6 cm maximally. Ultrasound of the right axilla showed a morphologically abnormal lymph node.  In the left breast there were some tubular densities behind the areola which by ultrasonography showed benign ductal ectasia.  On 01/28/2016 [facility] underwent biopsy of the right breast mass and abnormal right axillary lymph node. The pathology from this procedure (S [id number]) showed the lymph node to be benign. In the breast however there was an invasive ductal carcinoma, grade 3, which was estrogen and progesterone receptor negative. The proliferation marker was 70%. HER-2 was not amplified with a signals ratio of 1.32. The number per cell was 2.05.  The patient's subsequent history is as detailed below   PAST MEDICAL HISTORY: Past Medical History:  Diagnosis Date  . Anxiety   . Breast cancer ([place])   . Depression   . GERD (gastroesophageal reflux disease)   . History of radiation therapy 11/15/16-01/12/17   right chest wall and axilla treated to 45 Gy in 25 fractions, boosted and additional 14 Gy in 8 fractions  . Hypertension    diet controlled  . Obesity (BMI 35.0-39.9 without comorbidity)   . Pneumonia    as a child  . Seasonal allergies   . Sickle cell trait ([place])   . Termination of pregnancy (fetus) 04/02/16    PAST SURGICAL HISTORY: Past Surgical History:  Procedure Laterality Date  . CESAREAN SECTION  2004 and 2007  . MASTECTOMY W/ SENTINEL NODE BIOPSY Right 09/06/2016   Procedure: RIGHT BREAST MASTECTOMY WITH RIGHT AXILLARY SENTINEL LYMPH NODE BIOPSY;  Surgeon: Alphonsa Overall, MD;  Location: [facility];  Service: General;  Laterality: Right;  . PORT-A-CATH REMOVAL Left  09/06/2016   Procedure: REMOVAL PORT-A-CATH;  Surgeon: Alphonsa Overall, MD;  Location: [facility];  Service: General;  Laterality: Left;  . PORTACATH PLACEMENT    . PORTACATH PLACEMENT N/A 07/09/2019   Procedure: INSERTION PORT-A-CATH WITH ULTRASOUND;  Surgeon: Alphonsa Overall, MD;  Location: [facility];  Service: General;  Laterality: N/A;    FAMILY HISTORY Family History  Problem Relation Age of Onset  . Hypertension Mother   . Cancer Mother        dx "intestinal cancer" in her 37s; +surgery  . Other Mother        hysterectomy at young age for unspecified cause  . Heart Problems Mother   . Breast cancer Cousin        maternal 1st cousin dx female breast cancer at 48-46y  . Cancer Father   . Hypertension Father   . Heart Problems Maternal Aunt   . Diabetes Maternal Aunt   . Breast cancer Maternal Uncle        dx 64-65  . Heart Problems Maternal Uncle   . Breast cancer Maternal Grandmother 1  . Throat cancer Maternal Grandfather        d. 63s; smoker  . Sickle cell anemia Paternal Aunt   . Congestive Heart Failure Maternal Aunt   . Multiple sclerosis Cousin   . Cancer Other        maternal great uncle (MGM's brother); cancer removed from his side  . Heart attack Paternal Aunt        d. early 41s  The patient has very little information about her father. Her mother is currently 34 years old. She had a history of cervical cancer at age 34. The patient had 2 brothers, no sisters. The maternal grandfather had throat cancer. A maternal uncle was diagnosed with breast cancer as well as prostate cancer at the age of 8. 2 maternal cousins, one of them female, had breast cancer as well.   GYNECOLOGIC HISTORY:  No LMP recorded. Menarche age 22, first live birth age 11. The patient is GX P4. She was still having regular periods at the time of diagnosis. She took oral contraceptives in the 1990s with no side effects.--.  Stopped with chemotherapy and have not resumed as of May  2019   SOCIAL HISTORY:  (Updated 10/2018) She works as a Market researcher. The patient's significant other [name] works at [employer].  At home with the patient are her 3 children [name], [place] and [place]. There are age 39, 4, and 2 as of November 2019.  2 of them are disabled or have significant health problems, one with sickle cell disease, the other with autism and developmental delay.  The patient's son [name], currently 28 years old, lives in [place].    ADVANCED DIRECTIVES: Not in place   HEALTH MAINTENANCE: Social History   Tobacco Use  . Smoking status: Former Smoker    Packs/day: 1.00    Years: 20.00    Pack years: 20.00    Types: Cigarettes    Quit date: 04/01/2018    Years since quitting: 1.7  . Smokeless tobacco: Never Used  . Tobacco comment: Patient has quit  smoking x 1 year now  Substance Use Topics  . Alcohol use: Yes    Comment: occ  . Drug use: No    Colonoscopy:  PAP:  Bone density:  Lipid panel:  No Known Allergies  Current Outpatient Medications on File Prior to Visit  Medication Sig Dispense Refill  . calcium carbonate (TUMS - DOSED IN MG ELEMENTAL CALCIUM) 500 MG chewable tablet Chew 1 tablet by mouth daily as needed for indigestion or heartburn.    Marland Kitchen LORazepam (ATIVAN) 0.5 MG tablet Take 1 tablet (0.5 mg total) by mouth daily as needed for anxiety. 30 tablet 0  . omeprazole (PRILOSEC) 20 MG capsule Take 1 capsule (20 mg total) by mouth daily. 30 capsule 1  . traMADol (ULTRAM) 50 MG tablet Take 1 tablet (50 mg total) by mouth every 6 (six) hours as needed. 30 tablet 0  . valACYclovir (VALTREX) 1000 MG tablet Take 1 tablet (1,000 mg total) by mouth daily. 90 tablet 1  . [DISCONTINUED] prochlorperazine (COMPAZINE) 10 MG tablet Take 1 tablet (10 mg total) by mouth every 6 (six) hours as needed (Nausea or vomiting). 30 tablet 1   No current facility-administered medications on file prior to visit.     OBJECTIVE: [name] who appears stated age  65:   01/14/20 1225  BP: 117/75  Pulse: (!) 110  Resp: 20  Temp: 99.1 F (37.3 C)  SpO2: 99%   Wt Readings from Last 3 Encounters:  01/14/20 202 lb 6.4 oz (91.8 kg)  12/26/19 200 lb 3.2 oz (90.8 kg)  12/10/19 201 lb 1.6 oz (91.2 kg)   Body mass index is 37.62 kg/m.    ECOG FS:1 - Symptomatic but completely ambulatory  Sclerae unicteric, EOMs intact Wearing a mask Lungs no rales or rhonchi Heart regular rate and rhythm Abd soft, obese, nontender, positive bowel sounds MSK no focal spinal tenderness Neuro: nonfocal, well oriented, appropriate affect Breasts: The right breast is status post mastectomy with local recurrence.  The denuded area of concern is slightly larger than before and in addition there are 3/4 satellite lesions medially, each measuring approximately 2 to 3 mm.   Right chest wall 12/26/2019:     Right chest wall area 11/22/2019, which is the new baseline   LAB RESULTS: CMP Latest Ref Rng & Units 12/31/2019 12/11/2019 11/21/2019  Glucose 70 - 99 mg/dL 86 105(H) 106(H)  BUN 6 - 20 mg/dL 17 12 10   Creatinine 0.44 - 1.00 mg/dL 1.12(H) 0.96 0.91  Sodium 135 - 145 mmol/L 142 145 141  Potassium 3.5 - 5.1 mmol/L 3.6 3.3(L) 3.7  Chloride 98 - 111 mmol/L 104 104 103  CO2 22 - 32 mmol/L 27 30 27   Calcium 8.9 - 10.3 mg/dL 9.2 9.1 9.2  Total Protein 6.5 - 8.1 g/dL 8.1 8.0 8.1  Total Bilirubin 0.3 - 1.2 mg/dL 0.2(L) 0.3 0.4  Alkaline Phos 38 - 126 U/L 72 63 64  AST 15 - 41 U/L 20 43(H) 19  ALT 0 - 44 U/L 18 51(H) 17     CBC    Component Value Date/Time   WBC 6.9 12/31/2019 1215   RBC 3.12 (L) 12/31/2019 1215   RBC 3.12 (L) 12/31/2019 1215   HGB 9.8 (L) 12/31/2019 1215   HGB 6.3 (LL) 11/08/2019 1350   HGB 10.4 (L) 10/04/2016 1154   HCT 28.9 (L) 12/31/2019 1215   HCT 31.3 (L) 10/04/2016 1154   PLT 116 (L) 12/31/2019 1215   PLT 10 (L) 11/08/2019  1350   PLT 320 10/04/2016 1154    MCV 92.6 12/31/2019 1215   MCV 95.7 10/04/2016 1154   MCH 31.4 12/31/2019 1215   MCHC 33.9 12/31/2019 1215   RDW 18.6 (H) 12/31/2019 1215   RDW 16.3 (H) 10/04/2016 1154   LYMPHSABS 2.1 12/31/2019 1215   LYMPHSABS 1.7 10/04/2016 1154   MONOABS 1.0 12/31/2019 1215   MONOABS 0.5 10/04/2016 1154   EOSABS 0.1 12/31/2019 1215   EOSABS 0.3 10/04/2016 1154   BASOSABS 0.0 12/31/2019 1215   BASOSABS 0.0 10/04/2016 1154    STUDIES: No results found.  RESEARCH: Referred to PREVENT study, but was pregnant at the time; referred to [facility] 11202, but the biopsied lymph node was benign; referred to weight loss study but declined; referred to [facility] 12/09/2000, but the timing of radiation and 8 was greater than 60 days past the date of diagnosis; referred to health disparity study, but declined; referred to [id number] adjuvant therapy study, but the patient received Xeloda with radiation and therefore was ineligible   ASSESSMENT: 44 y.o. [place] woman status post right breast upper-outer quadrant biopsy 01/28/2016 for a clinical T2 N0 invasive ductal carcinoma, grade 3, triple negative, with an MIB-1 of 70%.  (a) suspicious right axillary lymph node biopsied 01/28/2016 was benign  (1) neoadjuvant chemotherapy: doxorubicin and cyclophosphamide in dose dense fashion 4 started 04/14/16, completed 05/26/2016, followed by paclitaxel and carboplatin weekly 12, Started 06/09/2016  (a) taxol discontinued after 7 doses because of neuropathy, last dose 07/21/2016  (2) genetics testing October 20, 2016 through the 32-gene Comprehensive Cancer Panel offered by [facility] Junius Roads, MD) (with MSH2 Exons 1-7 Inversion Analysis) found no deleterious mutations or VUSS  In APC, ATM, AXIN2, BARD1, BMPR1A, BRCA1, BRCA2, BRIP1, CDH1, CDK4, CDKN2A, CHEK2, EPCAM, FANCC, MLH1, MSH2, MSH6, MUTYH, NBN, PALB2, PMS2, POLD1, POLE, PTEN, RAD51C, RAD51D, SCG5/GREM1, SMAD4, STK11, TP53, VHL, and XRCC2.    (3)  right mastectomy and sentinel lymph node sampling 09/06/2016 showed a residual ypT1c ypN0, invasive ductal carcinoma, grade 3, with negative margins. Repeat prognostic panel again triple negative   (4) adjuvant radiation with capecitabine/Xeloda sensitization 11/15/16 - 01/12/17 Site/dose:   Right Chest Wall and axilla (4 field) treated to 45 Gy in 25 fractions, and then Boosted an additional 14.4 Gy in 8 fractions.  (5) tobacco abuse disorder: The patient quit smoking 04/04/2018  METASTATIC DISEASE:  July 2020 (6) nonspecific changes noted on chest CT 06/11/2019 were clarified by PET scan 06/19/2019 showing hypermetabolic disease in the right anterior chest wall, right internal mammary nodes, right and left axillary nodes, but no metastatic disease in the neck, lungs, abdomen or pelvis.  Bone marrow uptake suggests bony metastatic disease.  (a) CARIS requested obtained from 06/28/2019 sample confirmed a triple negativity, the tumor also was negative for the androgen receptor, was MSI stable and mismatch repair status proficient, with a low mutational burden.  BRCA 1 and 2 were negative and PD-L1 was negative.  PI K3 showed a variant of uncertain significance.  However the tumor was genomic LOH high  (b) CA-27-29 is informative: was 118.3 on 06/04/2019  (7) zoledronate started 07/17/2019--on hold currently due to dental issues, and until 6 weeks at least after dental work  (8) carboplatin/ gemcitabine days 1 and 8 Q21 day cycle started 07/10/2019, last dose 10/30/2019  (a) restaging studies 09/2019 showed no progression  (b) restaging studies after 6 cycles showed mild disease progression  (9) cyclophosphamide, methotrexate, fluorouracil chemotherapy started 11/13/2019, repeated every 21 days.  (  a) discontinued after cycle 3 (12/31/2019): Not effective  (10) to start capecitabine 01/21/2020, given 1 week on and 1 week off   PLAN: Annina did generally well with CMF, although she did feel  fatigue and had a mild rash.  Nevertheless the area on the right chest wall which we have been following is clearly progressed and there are now several satellite lesions slightly medial from the main area of concern.  (Unfortunately the camera did not work today to document).  Accordingly we are discontinuing CMF chemotherapy.  We discussed Taxotere every 3 weeks or capecitabine 1 week on 1 week off.  We are going to start with the capecitabine and I have put the orders in.  We also discussed the possible toxicities side effects and complications of this agent.  I am hopeful she will be able to start on 01/21/2020.  She is already scheduled for lab work that day.  She will see me 2 weeks later on March 2, and I will be the start of her second cycle.  Ideally we would do this for a total of 2 months and then reassess.  We discussed whether or not to do a CT of the chest at this point.  She would like to try this medication first and then reconsider that  She knows to call for any other issue that may develop before the next visit.  Total encounter time 30 minutes.Sarajane Jews C. Ella Golomb, MD 01/14/20 12:34 PM Medical Oncology and Hematology [facility] [place], [place] [address] Tel. [phone]    Fax. [phone]    I, Wilburn Mylar, am acting as scribe for Dr. [name]. Meilyn Heindl.  I, Lurline Del MD, have reviewed the above documentation for accuracy and completeness, and I agree with the above.   *Total Encounter Time as defined by the Centers for Medicare and Medicaid Services includes, in addition to the face-to-face time of a patient visit (documented in the note above) non-face-to-face time: obtaining and reviewing outside history, ordering and reviewing medications, tests or procedures, care coordination (communications with other health care professionals or caregivers) and documentation in the medical record.

## 2020-01-14 ENCOUNTER — Telehealth: Payer: Self-pay

## 2020-01-14 ENCOUNTER — Inpatient Hospital Stay: Payer: Medicaid Other | Attending: Oncology | Admitting: Oncology

## 2020-01-14 ENCOUNTER — Other Ambulatory Visit: Payer: Self-pay

## 2020-01-14 VITALS — BP 117/75 | HR 110 | Temp 99.1°F | Resp 20 | Ht 61.5 in | Wt 202.4 lb

## 2020-01-14 DIAGNOSIS — I10 Essential (primary) hypertension: Secondary | ICD-10-CM | POA: Diagnosis not present

## 2020-01-14 DIAGNOSIS — Z87891 Personal history of nicotine dependence: Secondary | ICD-10-CM | POA: Insufficient documentation

## 2020-01-14 DIAGNOSIS — K219 Gastro-esophageal reflux disease without esophagitis: Secondary | ICD-10-CM | POA: Insufficient documentation

## 2020-01-14 DIAGNOSIS — Z6841 Body Mass Index (BMI) 40.0 and over, adult: Secondary | ICD-10-CM | POA: Diagnosis not present

## 2020-01-14 DIAGNOSIS — G620 Drug-induced polyneuropathy: Secondary | ICD-10-CM

## 2020-01-14 DIAGNOSIS — C7951 Secondary malignant neoplasm of bone: Secondary | ICD-10-CM

## 2020-01-14 DIAGNOSIS — C50411 Malignant neoplasm of upper-outer quadrant of right female breast: Secondary | ICD-10-CM | POA: Insufficient documentation

## 2020-01-14 DIAGNOSIS — Z7189 Other specified counseling: Secondary | ICD-10-CM | POA: Diagnosis not present

## 2020-01-14 DIAGNOSIS — R21 Rash and other nonspecific skin eruption: Secondary | ICD-10-CM | POA: Insufficient documentation

## 2020-01-14 DIAGNOSIS — D573 Sickle-cell trait: Secondary | ICD-10-CM | POA: Diagnosis not present

## 2020-01-14 DIAGNOSIS — R5383 Other fatigue: Secondary | ICD-10-CM | POA: Insufficient documentation

## 2020-01-14 DIAGNOSIS — Z9221 Personal history of antineoplastic chemotherapy: Secondary | ICD-10-CM | POA: Insufficient documentation

## 2020-01-14 DIAGNOSIS — F419 Anxiety disorder, unspecified: Secondary | ICD-10-CM | POA: Diagnosis not present

## 2020-01-14 DIAGNOSIS — Z171 Estrogen receptor negative status [ER-]: Secondary | ICD-10-CM

## 2020-01-14 DIAGNOSIS — Z803 Family history of malignant neoplasm of breast: Secondary | ICD-10-CM | POA: Insufficient documentation

## 2020-01-14 DIAGNOSIS — T451X5A Adverse effect of antineoplastic and immunosuppressive drugs, initial encounter: Secondary | ICD-10-CM | POA: Diagnosis not present

## 2020-01-14 DIAGNOSIS — Z79899 Other long term (current) drug therapy: Secondary | ICD-10-CM | POA: Diagnosis not present

## 2020-01-14 DIAGNOSIS — E669 Obesity, unspecified: Secondary | ICD-10-CM | POA: Diagnosis not present

## 2020-01-14 DIAGNOSIS — C50919 Malignant neoplasm of unspecified site of unspecified female breast: Secondary | ICD-10-CM

## 2020-01-14 MED ORDER — CAPECITABINE 500 MG PO TABS: 1500.0000 mg | ORAL_TABLET | Freq: Two times a day (BID) | ORAL | 6 refills | Status: DC

## 2020-01-14 NOTE — Telephone Encounter (Signed)
Oral Oncology Patient Advocate Encounter  After completing a benefits investigation, prior authorization for Xeloda is not required at this time through [facility].  Patient's copay is $3.  [place] [facility] Phone [phone] Fax [phone] 01/14/2020 2:52 PM

## 2020-01-15 ENCOUNTER — Telehealth: Payer: Self-pay | Admitting: Pharmacist

## 2020-01-15 ENCOUNTER — Telehealth: Payer: Self-pay | Admitting: Oncology

## 2020-01-15 NOTE — Telephone Encounter (Signed)
Oral Oncology Pharmacist Encounter  Received new prescription for Xeloda (capecitabine) for the treatment of triple negative metastatic breast cancer, planned duration until disease progression or unacceptable drug toxicity.  CMP from 12/31/19 assessed, no relevant lab abnormalities. Prescription dose and frequency assessed.   Current medication list in Epic reviewed, one relevant DDIs with capecitabine identified: -Omeprazole: Proton Pump Inhibitors (PPI) may diminish the therapeutic effect of capecitabine. Recommend evaluating the need for a PPI/acid suppression. If acid suppression is needed, recommend switching to a H2 antagonist (eg, famotidine) to avoid this DDI.   Prescription has been e-scribed to the [facility] for benefits analysis and approval.  Oral Oncology Clinic will continue to follow for insurance authorization, copayment issues, initial counseling and start date.  Darl Pikes, PharmD, BCPS, [facility] Hematology/Oncology Clinical Pharmacist [facility] [phone]  01/15/2020 10:48 PM

## 2020-01-15 NOTE — Telephone Encounter (Signed)
I talk with patient regarding schedule change °

## 2020-01-16 MED FILL — CAPECITABINE 500 MG TABLET: 500 | 28 days supply | Qty: 84 | Fill #0

## 2020-01-16 NOTE — Telephone Encounter (Signed)
Oral Chemotherapy Pharmacist Encounter  [place] will deliver Xeloda to Rebecca [name] tomorrow 01/17/20. She knows the plan is for her to get started on 01/21/20.  Patient Education I spoke with patient for overview of new oral chemotherapy medication: Xeloda (capecitabine) for the treatment of triple negative metastatic breast cancer, planned duration until disease progression or unacceptable drug toxicity.   Pt is doing well. Counseled patient on administration, dosing, side effects, monitoring, drug-food interactions, safe handling, storage, and disposal. Patient will take 3 tablets (1,500 mg total) by mouth 2 (two) times daily after a meal. Take for 7 days, then off 7 days, then repeat.  Reviewed DDI with omeprazole with the patient. She reported taking omeprazole as needed and was okay with stopping the omeprazole. I let her know that she can use famotidine as an alternative for reflux.  Side effects include but not limited to: diarrhea, hand-foot syndrome, N/V, edema, fatigue.    Reviewed with patient importance of keeping a medication schedule and plan for any missed doses.  Rebecca [name] voiced understanding and appreciation. All questions answered. Medication handout placed in the mail.  Provided patient with Oral [facility] phone number. Patient knows to call the office with questions or concerns. Oral Chemotherapy Navigation Clinic will continue to follow.  Darl Pikes, PharmD, BCPS, [facility] Hematology/Oncology Clinical Pharmacist [facility] [phone]  01/16/2020 11:34 AM

## 2020-01-21 ENCOUNTER — Other Ambulatory Visit: Payer: Self-pay

## 2020-01-21 ENCOUNTER — Inpatient Hospital Stay: Payer: Medicaid Other

## 2020-01-21 DIAGNOSIS — R5383 Other fatigue: Secondary | ICD-10-CM | POA: Diagnosis not present

## 2020-01-21 DIAGNOSIS — I10 Essential (primary) hypertension: Secondary | ICD-10-CM | POA: Diagnosis not present

## 2020-01-21 DIAGNOSIS — C50411 Malignant neoplasm of upper-outer quadrant of right female breast: Secondary | ICD-10-CM | POA: Diagnosis not present

## 2020-01-21 DIAGNOSIS — K219 Gastro-esophageal reflux disease without esophagitis: Secondary | ICD-10-CM | POA: Diagnosis not present

## 2020-01-21 DIAGNOSIS — Z171 Estrogen receptor negative status [ER-]: Secondary | ICD-10-CM | POA: Diagnosis not present

## 2020-01-21 DIAGNOSIS — Z9221 Personal history of antineoplastic chemotherapy: Secondary | ICD-10-CM | POA: Diagnosis not present

## 2020-01-21 DIAGNOSIS — C7951 Secondary malignant neoplasm of bone: Secondary | ICD-10-CM

## 2020-01-21 DIAGNOSIS — C50919 Malignant neoplasm of unspecified site of unspecified female breast: Secondary | ICD-10-CM

## 2020-01-21 DIAGNOSIS — D573 Sickle-cell trait: Secondary | ICD-10-CM | POA: Diagnosis not present

## 2020-01-21 DIAGNOSIS — F419 Anxiety disorder, unspecified: Secondary | ICD-10-CM | POA: Diagnosis not present

## 2020-01-21 DIAGNOSIS — E669 Obesity, unspecified: Secondary | ICD-10-CM | POA: Diagnosis not present

## 2020-01-21 DIAGNOSIS — Z87891 Personal history of nicotine dependence: Secondary | ICD-10-CM | POA: Diagnosis not present

## 2020-01-21 DIAGNOSIS — Z803 Family history of malignant neoplasm of breast: Secondary | ICD-10-CM | POA: Diagnosis not present

## 2020-01-21 DIAGNOSIS — R21 Rash and other nonspecific skin eruption: Secondary | ICD-10-CM | POA: Diagnosis not present

## 2020-01-21 DIAGNOSIS — Z79899 Other long term (current) drug therapy: Secondary | ICD-10-CM | POA: Diagnosis not present

## 2020-01-21 LAB — CBC WITH DIFFERENTIAL/PLATELET
Abs Immature Granulocytes: 0.09 10*3/uL — ABNORMAL HIGH (ref 0.00–0.07)
Basophils Absolute: 0 10*3/uL (ref 0.0–0.1)
Basophils Relative: 0 %
Eosinophils Absolute: 0.1 10*3/uL (ref 0.0–0.5)
Eosinophils Relative: 1 %
HCT: 25.3 % — ABNORMAL LOW (ref 36.0–46.0)
Hemoglobin: 8.5 g/dL — ABNORMAL LOW (ref 12.0–15.0)
Immature Granulocytes: 1 %
Lymphocytes Relative: 33 %
Lymphs Abs: 2.4 10*3/uL (ref 0.7–4.0)
MCH: 32 pg (ref 26.0–34.0)
MCHC: 33.6 g/dL (ref 30.0–36.0)
MCV: 95.1 fL (ref 80.0–100.0)
Monocytes Absolute: 0.7 10*3/uL (ref 0.1–1.0)
Monocytes Relative: 10 %
Neutro Abs: 4 10*3/uL (ref 1.7–7.7)
Neutrophils Relative %: 55 %
Platelets: 129 10*3/uL — ABNORMAL LOW (ref 150–400)
RBC: 2.66 MIL/uL — ABNORMAL LOW (ref 3.87–5.11)
RDW: 19 % — ABNORMAL HIGH (ref 11.5–15.5)
WBC: 7.3 10*3/uL (ref 4.0–10.5)
nRBC: 0.4 % — ABNORMAL HIGH (ref 0.0–0.2)

## 2020-01-21 LAB — RETICULOCYTES
Immature Retic Fract: 45.5 % — ABNORMAL HIGH (ref 2.3–15.9)
RBC.: 2.62 MIL/uL — ABNORMAL LOW (ref 3.87–5.11)
Retic Count, Absolute: 47.2 10*3/uL (ref 19.0–186.0)
Retic Ct Pct: 1.8 % (ref 0.4–3.1)

## 2020-01-21 LAB — COMPREHENSIVE METABOLIC PANEL
ALT: 23 U/L (ref 0–44)
AST: 21 U/L (ref 15–41)
Albumin: 3.7 g/dL (ref 3.5–5.0)
Alkaline Phosphatase: 74 U/L (ref 38–126)
Anion gap: 8 (ref 5–15)
BUN: 10 mg/dL (ref 6–20)
CO2: 26 mmol/L (ref 22–32)
Calcium: 8.6 mg/dL — ABNORMAL LOW (ref 8.9–10.3)
Chloride: 109 mmol/L (ref 98–111)
Creatinine, Ser: 0.84 mg/dL (ref 0.44–1.00)
GFR calc Af Amer: >60 mL/min (ref >60)
GFR calc non Af Amer: >60 mL/min (ref >60)
Glucose, Bld: 108 mg/dL — ABNORMAL HIGH (ref 70–99)
Potassium: 3.3 mmol/L — ABNORMAL LOW (ref 3.5–5.1)
Sodium: 143 mmol/L (ref 135–145)
Total Bilirubin: 0.2 mg/dL — ABNORMAL LOW (ref 0.3–1.2)
Total Protein: 7.3 g/dL (ref 6.5–8.1)

## 2020-01-21 LAB — FERRITIN: Ferritin: 1310 ng/mL — ABNORMAL HIGH (ref 11–307)

## 2020-01-21 LAB — SAMPLE TO BLOOD BANK

## 2020-01-22 LAB — CANCER ANTIGEN 27.29: CA 27.29: 248.6 U/mL — ABNORMAL HIGH (ref 0.0–38.6)

## 2020-02-10 NOTE — Progress Notes (Signed)
[place]  [phone] [phone]   ID: Unknown Rebecca [name] DOB: 1976-03-01  MR#: [id number]  AYT#:[id number]  Patient Care Team: Rebecca Dew, FNP as PCP - General (Family Medicine) Rebecca Overall, MD as Consulting Physician (General Surgery) Rebecca [name], Rebecca Dad, MD as Consulting Physician (Oncology) Rebecca Pray, MD as Consulting Physician (Radiation Oncology) Rebecca [name], Rebecca Massed, NP as Nurse Practitioner (Hematology and Oncology) Rebecca Berthold, DO as Consulting Physician (Neurology) OTHER MD:  Rebecca [name]: triple negative stage IV breast cancer  CURRENT TREATMENT: capecitabine; [zoledronate]   INTERVAL HISTORY: Rebecca [name] returns today for follow-up and treatment of her triple negative breast cancer.    She was switched to capecitabine 1 week on, 1 week off in light of her disease progression. She started this on 01/21/2020.  Lab Results  Component Value Date   CA2729 248.6 (H) 01/21/2020   CA2729 304.4 (H) 12/31/2019   CA2729 291.2 (H) 12/11/2019   CA2729 200.8 (H) 11/08/2019   CA2729 183.7 (H) 10/23/2019    REVIEW OF SYSTEMS: Rebecca [name] is tolerating the capecitabine well.  In particular problems with diarrhea, mouth sores or palmar plantar dysesthesia.  She feels a little itchy in her face and lower legs at times.  She is not sure whether it is having any effect on her chest wall metastases.  Gives out a little she says but she still is sitting few days each week.  Family is otherwise doing fine.  She has had both Materna shots, the second 1 on 01/15/2020   BREAST CANCER HISTORY: From the original intake note:  Rebecca [name] herself noted a change in her right breast sometime around September or October 2016. She did not bring it to intermediate medical attention, but on 01/13/2016 she established herself in Rebecca [name]' service and she was set up for bilateral diagnostic mammography with tomosynthesis and bilateral ultrasonography at the [facility] 01/19/2016. The breast density was category B. In the upper outer quadrant of the right breast there was a spiculated mass measuring 2.8 cm. On physical exam this was palpable. Targeted ultrasonography confirmed an irregular hypoechoic mass in the right breast 11:30 o'clock position measuring 2.6 cm maximally. Ultrasound of the right axilla showed a morphologically abnormal lymph node.  In the left breast there were some tubular densities behind the areola which by ultrasonography showed benign ductal ectasia.  On 01/28/2016 Rebecca [name] underwent biopsy of the right breast mass and abnormal right axillary lymph node. The pathology from this procedure (S [id number]) showed the lymph node to be benign. In the breast however there was an invasive ductal carcinoma, grade 3, which was estrogen and progesterone receptor negative. The proliferation marker was 70%. HER-2 was not amplified with a signals ratio of 1.32. The number per cell was 2.05.  The patient's subsequent history is as detailed below   PAST MEDICAL HISTORY: Past Medical History:  Diagnosis Date   Anxiety    Breast cancer ([place])    Depression    GERD (gastroesophageal reflux disease)    History of radiation therapy 11/15/16-01/12/17   right chest wall and axilla treated to 45 Gy in 25 fractions, boosted and additional 14 Gy in 8 fractions   Hypertension    diet controlled   Obesity (BMI 35.0-39.9 without comorbidity)    Pneumonia    as a child   Seasonal allergies    Sickle cell trait ([place])    Termination of pregnancy (fetus) 04/02/16    PAST SURGICAL HISTORY: Past Surgical History:  Procedure Laterality Date   CESAREAN SECTION     2004 and 2007   MASTECTOMY W/ SENTINEL NODE BIOPSY Right 09/06/2016   Procedure: RIGHT BREAST MASTECTOMY WITH RIGHT AXILLARY SENTINEL LYMPH NODE BIOPSY;  Surgeon: Rebecca Overall, MD;  Location: [facility];  Service: General;  Laterality: Right;   PORT-A-CATH REMOVAL Left  09/06/2016   Procedure: REMOVAL PORT-A-CATH;  Surgeon: Rebecca Overall, MD;  Location: [facility];  Service: General;  Laterality: Left;   PORTACATH PLACEMENT     PORTACATH PLACEMENT N/A 07/09/2019   Procedure: INSERTION PORT-A-CATH WITH ULTRASOUND;  Surgeon: Rebecca Overall, MD;  Location: [facility];  Service: General;  Laterality: N/A;    FAMILY HISTORY Family History  Problem Relation Age of Onset   Hypertension Mother    Cancer Mother        dx "intestinal cancer" in her 58s; +surgery   Other Mother        hysterectomy at young age for unspecified cause   Heart Problems Mother    Breast cancer Cousin        maternal 1st cousin dx female breast cancer at 39-46y   Cancer Father    Hypertension Father    Heart Problems Maternal Aunt    Diabetes Maternal Aunt    Breast cancer Maternal Uncle        dx 64-65   Heart Problems Maternal Uncle    Breast cancer Maternal Grandmother 50   Throat cancer Maternal Grandfather        d. 53s; smoker   Sickle cell anemia Paternal Aunt    Congestive Heart Failure Maternal Aunt    Multiple sclerosis Cousin    Cancer Other        maternal great uncle (MGM's brother); cancer removed from his side   Heart attack Paternal Aunt        d. early 21s  The patient has very little information about her father. Her mother is currently 33 years old. She had a history of cervical cancer at age 26. The patient had 2 brothers, no sisters. The maternal grandfather had throat cancer. A maternal uncle was diagnosed with breast cancer as well as prostate cancer at the age of 54. 2 maternal cousins, one of them female, had breast cancer as well.   GYNECOLOGIC HISTORY:  No LMP recorded. Menarche age 61, first live birth age 83. The patient is GX P4. She was still having regular periods at the time of diagnosis. She took oral contraceptives in the 1990s with no side effects.--.  Stopped with chemotherapy and have not resumed as of May  2019   SOCIAL HISTORY:  (Updated 10/2018) She works as a Market researcher. The patient's significant other Rebecca [name] works at [employer].  At home with the patient are her 3 children Rebecca [name], [place] and [place]. There are age 43, 13, and 59 as of November 2019.  2 of them are disabled or have significant health problems, one with sickle cell disease, the other with autism and developmental delay.  The patient's son [name], currently 30 years old, lives in [place].    ADVANCED DIRECTIVES: Not in place   HEALTH MAINTENANCE: Social History   Tobacco Use   Smoking status: Former Smoker    Packs/day: 1.00    Years: 20.00    Pack years: 20.00    Types: Cigarettes    Quit date: 04/01/2018    Years since quitting: 1.8  Smokeless tobacco: Never Used   Tobacco comment: Patient has quit smoking x 1 year now  Substance Use Topics   Alcohol use: Yes    Comment: occ   Drug use: No    Colonoscopy:  PAP:  Bone density:  Lipid panel:  No Known Allergies  Current Outpatient Medications on File Prior to Visit  Medication Sig Dispense Refill   calcium carbonate (TUMS - DOSED IN MG ELEMENTAL CALCIUM) 500 MG chewable tablet Chew 1 tablet by mouth daily as needed for indigestion or heartburn.     capecitabine (XELODA) 500 MG tablet Take 3 tablets (1,500 mg total) by mouth 2 (two) times daily after a meal. Take for 7 days, then off 7 days, then repeat. 84 tablet 6   LORazepam (ATIVAN) 0.5 MG tablet Take 1 tablet (0.5 mg total) by mouth daily as needed for anxiety. 30 tablet 0   traMADol (ULTRAM) 50 MG tablet Take 1 tablet (50 mg total) by mouth every 6 (six) hours as needed. 30 tablet 0   valACYclovir (VALTREX) 1000 MG tablet Take 1 tablet (1,000 mg total) by mouth daily. 90 tablet 1   [DISCONTINUED] prochlorperazine (COMPAZINE) 10 MG tablet Take 1 tablet (10 mg total) by mouth every 6 (six) hours as needed (Nausea or vomiting). 30 tablet  1   No current facility-administered medications on file prior to visit.    OBJECTIVE: [name] in no acute distress  Vitals:   02/11/20 1435  BP: 109/80  Pulse: 88  Resp: 18  Temp: 98.1 F (36.7 C)  SpO2: 100%   Wt Readings from Last 3 Encounters:  02/11/20 198 lb 3.2 oz (89.9 kg)  01/14/20 202 lb 6.4 oz (91.8 kg)  12/26/19 200 lb 3.2 oz (90.8 kg)   Body mass index is 36.84 kg/m.    ECOG FS:1 - Symptomatic but completely ambulatory  Sclerae unicteric, EOMs intact Wearing a mask No cervical or supraclavicular adenopathy Lungs no rales or rhonchi Heart regular rate and rhythm Abd soft, nontender, positive bowel sounds MSK no focal spinal tenderness, no upper extremity lymphedema Neuro: nonfocal, well oriented, appropriate affect Breasts: The right breast, status post mastectomy, with local recurrence, is imaged again below. Right chest wall 02/11/2020.     Right chest wall 12/26/2019:     Right chest wall area 11/22/2019, which is the new baseline   LAB RESULTS: CMP Latest Ref Rng & Units 02/11/2020 01/21/2020 12/31/2019  Glucose 70 - 99 mg/dL 107(H) 108(H) 86  BUN 6 - 20 mg/dL _0 Creatinine 0.44 - 1.00 mg/dL 0.83 0.84 1.12(H)  Sodium 135 - 145 mmol/L 142 143 142  Potassium 3.5 - 5.1 mmol/L 3.3(L) 3.3(L) 3.6  Chloride 98 - 111 mmol/L 106 109 104  CO2 22 - 32 mmol/L _1 Calcium 8.9 - 10.3 mg/dL 8.8(L) 8.6(L) 9.2  Total Protein 6.5 - 8.1 g/dL 8.0 7.3 8.1  Total Bilirubin 0.3 - 1.2 mg/dL 0.4 0.2(L) 0.2(L)  Alkaline Phos 38 - 126 U/L 71 74 72  AST 15 - 41 U/L _2 ALT 0 - 44 U/L _3 CBC    Component Value Date/Time   WBC 8.2 02/11/2020 1416   RBC 2.94 (L) 02/11/2020 1416   RBC 2.92 (L) 02/11/2020 1416   HGB 9.7 (L) 02/11/2020 1416   HGB 6.3 (LL) 11/08/2019 1350   HGB 10.4 (L) 10/04/2016 1154   HCT 28.4 (L) 02/11/2020 1416   HCT 31.3 (L)  10/04/2016 1154   PLT 175 02/11/2020 1416   PLT 10 (L) 11/08/2019  1350   PLT 320 10/04/2016 1154   MCV 97.3 02/11/2020 1416   MCV 95.7 10/04/2016 1154   MCH 33.2 02/11/2020 1416   MCHC 34.2 02/11/2020 1416   RDW 19.5 (H) 02/11/2020 1416   RDW 16.3 (H) 10/04/2016 1154   LYMPHSABS 1.6 02/11/2020 1416   LYMPHSABS 1.7 10/04/2016 1154   MONOABS 0.7 02/11/2020 1416   MONOABS 0.5 10/04/2016 1154   EOSABS 0.1 02/11/2020 1416   EOSABS 0.3 10/04/2016 1154   BASOSABS 0.0 02/11/2020 1416   BASOSABS 0.0 10/04/2016 1154    STUDIES: No results found.   RESEARCH: Referred to PREVENT study, but was pregnant at the time; referred to [facility] 11202, but the biopsied lymph node was benign; referred to weight loss study but declined; referred to [facility] 12/09/2000, but the timing of radiation and 8 was greater than 60 days past the date of diagnosis; referred to health disparity study, but declined; referred to [id number] adjuvant therapy study, but the patient received Xeloda with radiation and therefore was ineligible   ASSESSMENT: 44 y.o. [place] woman status post right breast upper-outer quadrant biopsy 01/28/2016 for a clinical T2 N0 invasive ductal carcinoma, grade 3, triple negative, with an MIB-1 of 70%.  (a) suspicious right axillary lymph node biopsied 01/28/2016 was benign  (1) neoadjuvant chemotherapy: doxorubicin and cyclophosphamide in dose dense fashion 4 started 04/14/16, completed 05/26/2016, followed by paclitaxel and carboplatin weekly 12, Started 06/09/2016  (a) taxol discontinued after 7 doses because of neuropathy, last dose 07/21/2016  (2) genetics testing October 20, 2016 through the 32-gene Comprehensive Cancer Panel offered by [facility] Rebecca Roads, MD) (with MSH2 Exons 1-7 Inversion Analysis) found no deleterious mutations or VUSS  In APC, ATM, AXIN2, BARD1, BMPR1A, BRCA1, BRCA2, BRIP1, CDH1, CDK4, CDKN2A, CHEK2, EPCAM, FANCC, MLH1, MSH2, MSH6, MUTYH, NBN, PALB2, PMS2, POLD1, POLE, PTEN, RAD51C, RAD51D, SCG5/GREM1, SMAD4,  STK11, TP53, VHL, and XRCC2.    (3) right mastectomy and sentinel lymph node sampling 09/06/2016 showed a residual ypT1c ypN0, invasive ductal carcinoma, grade 3, with negative margins. Repeat prognostic panel again triple negative   (4) adjuvant radiation with capecitabine/Xeloda sensitization 11/15/16 - 01/12/17 Site/dose:   Right Chest Wall and axilla (4 field) treated to 45 Gy in 25 fractions, and then Boosted an additional 14.4 Gy in 8 fractions.  (5) tobacco abuse disorder: The patient quit smoking 04/04/2018  METASTATIC DISEASE:  July 2020 (6) nonspecific changes noted on chest CT 06/11/2019 were clarified by PET scan 06/19/2019 showing hypermetabolic disease in the right anterior chest wall, right internal mammary nodes, right and left axillary nodes, but no metastatic disease in the neck, lungs, abdomen or pelvis.  Bone marrow uptake suggests bony metastatic disease.  (a) CARIS requested obtained from 06/28/2019 sample confirmed a triple negativity, the tumor also was negative for the androgen receptor, was MSI stable and mismatch repair status proficient, with a low mutational burden.  BRCA 1 and 2 were negative and PD-L1 was negative.  PI K3 showed a variant of uncertain significance.  However the tumor was genomic LOH high  (b) CA-27-29 is informative: was 118.3 on 06/04/2019  (7) zoledronate started 07/17/2019--on hold currently due to dental issues, and until 6 weeks at least after dental work  (8) carboplatin/ gemcitabine days 1 and 8 Q21 day cycle started 07/10/2019, last dose 10/30/2019  (a) restaging studies 09/2019 showed no progression  (b) restaging studies after 6 cycles showed  mild disease progression  (9) cyclophosphamide, methotrexate, fluorouracil chemotherapy started 11/13/2019, repeated every 21 days.  (a) discontinued after cycle 3 (12/31/2019): No evidence of response  (10) started capecitabine 01/21/2020, given 1 week on and 1 week off at standard doses (1500 mg  twice daily)   PLAN: Hartlyn is tolerating the capecitabine well.  I do not see evidence of response at this point but she has only had 2 weeks of treatment so far.  She is currently on the "off week" and will restart treatment on 02/17/2020 and then again on 03/02/2020.  I will see her 3 weeks from today.  The plan would be to continue capecitabine 2 for a minimum of 5 cycles, 10 weeks, before deciding on effectiveness.  She knows to call for any other issue that may develop before the next visit.  Total encounter time 48mnutes.*Sarajane JewsC. Jandiel Magallanes, MD 02/11/20 4:07 PM Medical Oncology and H[facility] [id number]. [phone]   Fax. [phone]   I, KWilburn Mylar am acting as scribe for Dr. GVirgie [name] Rebecca [name].  I, GLurline DelMD, have reviewed the above documentation for accuracy and completeness, and I agree with the above.   *Total Encounter Time as defined by the Centers for Medicare and Medicaid Services includes, in addition to the face-to-face time of a patient visit (documented in the note above) non-face-to-face time: obtaining and reviewing outside history, ordering and reviewing medications, tests or procedures, care coordination (communications with other health care professionals or caregivers) and documentation in the medical record.

## 2020-02-11 ENCOUNTER — Ambulatory Visit: Payer: Medicaid Other

## 2020-02-11 ENCOUNTER — Inpatient Hospital Stay: Payer: Medicaid Other

## 2020-02-11 ENCOUNTER — Other Ambulatory Visit: Payer: Medicaid Other

## 2020-02-11 ENCOUNTER — Telehealth: Payer: Self-pay | Admitting: Oncology

## 2020-02-11 ENCOUNTER — Inpatient Hospital Stay: Payer: Medicaid Other | Attending: Oncology

## 2020-02-11 ENCOUNTER — Inpatient Hospital Stay (HOSPITAL_BASED_OUTPATIENT_CLINIC_OR_DEPARTMENT_OTHER): Payer: Medicaid Other | Admitting: Oncology

## 2020-02-11 ENCOUNTER — Other Ambulatory Visit: Payer: Self-pay

## 2020-02-11 VITALS — BP 109/80 | HR 88 | Temp 98.1°F | Resp 18 | Ht 61.5 in | Wt 198.2 lb

## 2020-02-11 DIAGNOSIS — I10 Essential (primary) hypertension: Secondary | ICD-10-CM | POA: Insufficient documentation

## 2020-02-11 DIAGNOSIS — Z803 Family history of malignant neoplasm of breast: Secondary | ICD-10-CM | POA: Insufficient documentation

## 2020-02-11 DIAGNOSIS — Z923 Personal history of irradiation: Secondary | ICD-10-CM | POA: Diagnosis not present

## 2020-02-11 DIAGNOSIS — C50411 Malignant neoplasm of upper-outer quadrant of right female breast: Secondary | ICD-10-CM

## 2020-02-11 DIAGNOSIS — E669 Obesity, unspecified: Secondary | ICD-10-CM | POA: Diagnosis not present

## 2020-02-11 DIAGNOSIS — F418 Other specified anxiety disorders: Secondary | ICD-10-CM | POA: Insufficient documentation

## 2020-02-11 DIAGNOSIS — Z8042 Family history of malignant neoplasm of prostate: Secondary | ICD-10-CM | POA: Diagnosis not present

## 2020-02-11 DIAGNOSIS — C7989 Secondary malignant neoplasm of other specified sites: Secondary | ICD-10-CM | POA: Diagnosis not present

## 2020-02-11 DIAGNOSIS — Z87891 Personal history of nicotine dependence: Secondary | ICD-10-CM | POA: Insufficient documentation

## 2020-02-11 DIAGNOSIS — Z171 Estrogen receptor negative status [ER-]: Secondary | ICD-10-CM | POA: Diagnosis not present

## 2020-02-11 DIAGNOSIS — K219 Gastro-esophageal reflux disease without esophagitis: Secondary | ICD-10-CM | POA: Insufficient documentation

## 2020-02-11 DIAGNOSIS — C7951 Secondary malignant neoplasm of bone: Secondary | ICD-10-CM

## 2020-02-11 DIAGNOSIS — L299 Pruritus, unspecified: Secondary | ICD-10-CM | POA: Diagnosis not present

## 2020-02-11 DIAGNOSIS — Z8541 Personal history of malignant neoplasm of cervix uteri: Secondary | ICD-10-CM | POA: Diagnosis not present

## 2020-02-11 DIAGNOSIS — Z801 Family history of malignant neoplasm of trachea, bronchus and lung: Secondary | ICD-10-CM | POA: Insufficient documentation

## 2020-02-11 DIAGNOSIS — C50919 Malignant neoplasm of unspecified site of unspecified female breast: Secondary | ICD-10-CM

## 2020-02-11 DIAGNOSIS — D573 Sickle-cell trait: Secondary | ICD-10-CM | POA: Diagnosis not present

## 2020-02-11 DIAGNOSIS — Z9011 Acquired absence of right breast and nipple: Secondary | ICD-10-CM | POA: Insufficient documentation

## 2020-02-11 LAB — COMPREHENSIVE METABOLIC PANEL
ALT: 10 U/L (ref 0–44)
AST: 17 U/L (ref 15–41)
Albumin: 4 g/dL (ref 3.5–5.0)
Alkaline Phosphatase: 71 U/L (ref 38–126)
Anion gap: 8 (ref 5–15)
BUN: 10 mg/dL (ref 6–20)
CO2: 28 mmol/L (ref 22–32)
Calcium: 8.8 mg/dL — ABNORMAL LOW (ref 8.9–10.3)
Chloride: 106 mmol/L (ref 98–111)
Creatinine, Ser: 0.83 mg/dL (ref 0.44–1.00)
GFR calc Af Amer: >60 mL/min (ref >60)
GFR calc non Af Amer: >60 mL/min (ref >60)
Glucose, Bld: 107 mg/dL — ABNORMAL HIGH (ref 70–99)
Potassium: 3.3 mmol/L — ABNORMAL LOW (ref 3.5–5.1)
Sodium: 142 mmol/L (ref 135–145)
Total Bilirubin: 0.4 mg/dL (ref 0.3–1.2)
Total Protein: 8 g/dL (ref 6.5–8.1)

## 2020-02-11 LAB — SAMPLE TO BLOOD BANK

## 2020-02-11 LAB — CBC WITH DIFFERENTIAL/PLATELET
Abs Immature Granulocytes: 0.02 10*3/uL (ref 0.00–0.07)
Basophils Absolute: 0 10*3/uL (ref 0.0–0.1)
Basophils Relative: 0 %
Eosinophils Absolute: 0.1 10*3/uL (ref 0.0–0.5)
Eosinophils Relative: 2 %
HCT: 28.4 % — ABNORMAL LOW (ref 36.0–46.0)
Hemoglobin: 9.7 g/dL — ABNORMAL LOW (ref 12.0–15.0)
Immature Granulocytes: 0 %
Lymphocytes Relative: 20 %
Lymphs Abs: 1.6 10*3/uL (ref 0.7–4.0)
MCH: 33.2 pg (ref 26.0–34.0)
MCHC: 34.2 g/dL (ref 30.0–36.0)
MCV: 97.3 fL (ref 80.0–100.0)
Monocytes Absolute: 0.7 10*3/uL (ref 0.1–1.0)
Monocytes Relative: 9 %
Neutro Abs: 5.7 10*3/uL (ref 1.7–7.7)
Neutrophils Relative %: 69 %
Platelets: 175 10*3/uL (ref 150–400)
RBC: 2.92 MIL/uL — ABNORMAL LOW (ref 3.87–5.11)
RDW: 19.5 % — ABNORMAL HIGH (ref 11.5–15.5)
WBC: 8.2 10*3/uL (ref 4.0–10.5)
nRBC: 0 % (ref 0.0–0.2)

## 2020-02-11 LAB — RETICULOCYTES
Immature Retic Fract: 23.6 % — ABNORMAL HIGH (ref 2.3–15.9)
RBC.: 2.94 MIL/uL — ABNORMAL LOW (ref 3.87–5.11)
Retic Count, Absolute: 43.5 10*3/uL (ref 19.0–186.0)
Retic Ct Pct: 1.5 % (ref 0.4–3.1)

## 2020-02-11 LAB — FERRITIN: Ferritin: 772 ng/mL — ABNORMAL HIGH (ref 11–307)

## 2020-02-11 MED ORDER — SODIUM CHLORIDE 0.9% FLUSH
10.0000 mL | INTRAVENOUS | Status: DC | PRN
  Administered 2020-02-11: 10 mL
  Filled 2020-02-11: qty 10

## 2020-02-11 MED ORDER — HEPARIN SOD (PORK) LOCK FLUSH 100 UNIT/ML IV SOLN
500.0000 [IU] | Freq: Once | INTRAVENOUS | Status: AC | PRN
  Administered 2020-02-11: 500 [IU]
  Filled 2020-02-11: qty 5

## 2020-02-11 NOTE — Telephone Encounter (Signed)
Scheduled 3/29 apt per 3/9 los. Was not able to reach pt at the phone number on file. I mailed an appt reminder and letter.

## 2020-02-12 LAB — CANCER ANTIGEN 27.29: CA 27.29: 340.6 U/mL — ABNORMAL HIGH (ref 0.0–38.6)

## 2020-02-13 MED FILL — CAPECITABINE 500 MG TABLET: 500 | 28 days supply | Qty: 84 | Fill #1

## 2020-03-01 NOTE — Progress Notes (Signed)
[facility]  [phone] [phone]   ID: [name] DOB: 12-Jan-1976  MR#: [id number]  SLH#:[id number]  Patient Care Team: Dorena Dew, FNP as PCP - General (Family Medicine) Alphonsa Overall, MD as Consulting Physician (General Surgery) Hyacinth Marcelli, [name], MD as Consulting Physician (Oncology) Gery Pray, MD as Consulting Physician (Radiation Oncology) Delice Bison, Charlestine Massed, NP as Nurse Practitioner (Hematology and Oncology) Alda Berthold, DO as Consulting Physician (Neurology) OTHER MD:  CHIEF COMPLAINT: triple negative stage IV breast cancer  CURRENT TREATMENT: capecitabine; [zoledronate]   INTERVAL HISTORY: Rayssa returns today for follow-up and treatment of her triple negative breast cancer.    She was switched to capecitabine 1 week on, 1 week off in light of her disease progression. She started this on 01/21/2020.  She is tolerating this well as far as it goes, with no diarrhea, mouth sores, or palmar plantar erythrodysesthesia.  She has no co-pay for this medication.  Lab Results  Component Value Date   CA2729 340.6 (H) 02/11/2020   CA2729 248.6 (H) 01/21/2020   CA2729 304.4 (H) 12/31/2019   CA2729 291.2 (H) 12/11/2019   CA2729 200.8 (H) 11/08/2019    REVIEW OF SYSTEMS: Gwenith continues to work as before and tells me family is fine.  She has had both her Covid shots.  She thinks despite the capecitabine the tumor may be growing.  She has discomfort in the right chest wall area which she treats with tramadol perhaps once a day.  This does constipate her and she treats that with fruits and vegetables and occasionally a laxative.  There have been no unusual headaches visual changes nausea vomiting or falls.  She denies cough phlegm production or pleurisy.  Detailed review of systems was otherwise stable.   BREAST CANCER HISTORY: From the original intake note:  Onell herself noted a change in her right breast sometime around  September or October 2016. She did not bring it to intermediate medical attention, but on 01/13/2016 she established herself in Dr. Smith Robert' service and she was set up for bilateral diagnostic mammography with tomosynthesis and bilateral ultrasonography at the [facility] 01/19/2016. The breast density was category B. In the upper outer quadrant of the right breast there was a spiculated mass measuring 2.8 cm. On physical exam this was palpable. Targeted ultrasonography confirmed an irregular hypoechoic mass in the right breast 11:30 o'clock position measuring 2.6 cm maximally. Ultrasound of the right axilla showed a morphologically abnormal lymph node.  In the left breast there were some tubular densities behind the areola which by ultrasonography showed benign ductal ectasia.  On 01/28/2016 Zakayla underwent biopsy of the right breast mass and abnormal right axillary lymph node. The pathology from this procedure (S AAA [id number]) showed the lymph node to be benign. In the breast however there was an invasive ductal carcinoma, grade 3, which was estrogen and progesterone receptor negative. The proliferation marker was 70%. HER-2 was not amplified with a signals ratio of 1.32. The number per cell was 2.05.  The patient's subsequent history is as detailed below   PAST MEDICAL HISTORY: Past Medical History:  Diagnosis Date   Anxiety    Breast cancer ([place])    Depression    GERD (gastroesophageal reflux disease)    History of radiation therapy 11/15/16-01/12/17   right chest wall and axilla treated to 45 Gy in 25 fractions, boosted and additional 14 Gy in 8 fractions   Hypertension    diet controlled   Obesity (  BMI 35.0-39.9 without comorbidity)    Pneumonia    as a child   Seasonal allergies    Sickle cell trait ([place])    Termination of pregnancy (fetus) 04/02/16    PAST SURGICAL HISTORY: Past Surgical History:  Procedure Laterality Date   CESAREAN SECTION     2004 and 2007    MASTECTOMY W/ SENTINEL NODE BIOPSY Right 09/06/2016   Procedure: RIGHT BREAST MASTECTOMY WITH RIGHT AXILLARY SENTINEL LYMPH NODE BIOPSY;  Surgeon: Alphonsa Overall, MD;  Location: [facility];  Service: General;  Laterality: Right;   PORT-A-CATH REMOVAL Left 09/06/2016   Procedure: REMOVAL PORT-A-CATH;  Surgeon: Alphonsa Overall, MD;  Location: [facility];  Service: General;  Laterality: Left;   PORTACATH PLACEMENT     PORTACATH PLACEMENT N/A 07/09/2019   Procedure: INSERTION PORT-A-CATH WITH ULTRASOUND;  Surgeon: Alphonsa Overall, MD;  Location: [facility];  Service: General;  Laterality: N/A;    FAMILY HISTORY Family History  Problem Relation Age of Onset   Hypertension Mother    Cancer Mother        dx "intestinal cancer" in her 16s; +surgery   Other Mother        hysterectomy at young age for unspecified cause   Heart Problems Mother    Breast cancer Cousin        maternal 1st cousin dx female breast cancer at 3-46y   Cancer Father    Hypertension Father    Heart Problems Maternal Aunt    Diabetes Maternal Aunt    Breast cancer Maternal Uncle        dx 64-65   Heart Problems Maternal Uncle    Breast cancer Maternal Grandmother 50   Throat cancer Maternal Grandfather        d. 70s; smoker   Sickle cell anemia Paternal Aunt    Congestive Heart Failure Maternal Aunt    Multiple sclerosis Cousin    Cancer Other        maternal great uncle (MGM's brother); cancer removed from his side   Heart attack Paternal Aunt        d. early 84s  The patient has very little information about her father. Her mother is currently 37 years old. She had a history of cervical cancer at age 33. The patient had 2 brothers, no sisters. The maternal grandfather had throat cancer. A maternal uncle was diagnosed with breast cancer as well as prostate cancer at the age of 30. 2 maternal cousins, one of them female, had breast cancer as well.   GYNECOLOGIC HISTORY:  No LMP  recorded. Menarche age 25, first live birth age 74. The patient is GX P4. She was still having regular periods at the time of diagnosis. She took oral contraceptives in the 1990s with no side effects.--.  Stopped with chemotherapy and have not resumed as of May 2019   SOCIAL HISTORY:  (Updated 10/2018) She works as a Market researcher. The patient's significant other [name] works at [employer].  At home with the patient are her 3 children [name], Wann and Sonterra. There are age 63, 63, and 22 as of November 2019.  2 of them are disabled or have significant health problems, one with sickle cell disease, the other with autism and developmental delay.  The patient's son [name], currently 44 years old, lives in [place].    ADVANCED DIRECTIVES: Not in place   HEALTH MAINTENANCE: Social History   Tobacco  Use   Smoking status: Former Smoker    Packs/day: 1.00    Years: 20.00    Pack years: 20.00    Types: Cigarettes    Quit date: 04/01/2018    Years since quitting: 1.9   Smokeless tobacco: Never Used   Tobacco comment: Patient has quit smoking x 1 year now  Substance Use Topics   Alcohol use: Yes    Comment: occ   Drug use: No    Colonoscopy:  PAP:  Bone density:  Lipid panel:  No Known Allergies  Current Outpatient Medications on File Prior to Visit  Medication Sig Dispense Refill   calcium carbonate (TUMS - DOSED IN MG ELEMENTAL CALCIUM) 500 MG chewable tablet Chew 1 tablet by mouth daily as needed for indigestion or heartburn.     capecitabine (XELODA) 500 MG tablet Take 3 tablets (1,500 mg total) by mouth 2 (two) times daily after a meal. Take for 7 days, then off 7 days, then repeat. 84 tablet 6   LORazepam (ATIVAN) 0.5 MG tablet Take 1 tablet (0.5 mg total) by mouth daily as needed for anxiety. 30 tablet 0   traMADol (ULTRAM) 50 MG tablet Take 1 tablet (50 mg total) by mouth every 6 (six) hours as needed. 30 tablet 0    valACYclovir (VALTREX) 1000 MG tablet Take 1 tablet (1,000 mg total) by mouth daily. 90 tablet 1   [DISCONTINUED] prochlorperazine (COMPAZINE) 10 MG tablet Take 1 tablet (10 mg total) by mouth every 6 (six) hours as needed (Nausea or vomiting). 30 tablet 1   No current facility-administered medications on file prior to visit.    OBJECTIVE: African-American woman who appears stated age  3:   03/02/20 1257  BP: 127/81  Pulse: 83  Resp: 17  Temp: 97.8 F (36.6 C)  SpO2: 100%   Wt Readings from Last 3 Encounters:  03/02/20 202 lb (91.6 kg)  02/11/20 198 lb 3.2 oz (89.9 kg)  01/14/20 202 lb 6.4 oz (91.8 kg)   Body mass index is 37.55 kg/m.    ECOG FS:1 - Symptomatic but completely ambulatory  Sclerae unicteric, EOMs intact Wearing a mask No cervical or supraclavicular adenopathy Lungs no rales or rhonchi Heart regular rate and rhythm Abd soft, obese, nontender, positive bowel sounds MSK no focal spinal tenderness Neuro: nonfocal, well oriented, appropriate affect Breasts: The right breast is status post mastectomy, with chest wall recurrence which has been imaged repeatedly below.  The left axilla has palpable adenopathy, which is tender.   Right chest wall 02/11/2020.     Right chest wall 12/26/2019:     Right chest wall area 11/22/2019, which is the new baseline   LAB RESULTS: CMP Latest Ref Rng & Units 02/11/2020 01/21/2020 12/31/2019  Glucose 70 - 99 mg/dL 107(H) 108(H) 86  BUN 6 - 20 mg/dL _0 Creatinine 0.44 - 1.00 mg/dL 0.83 0.84 1.12(H)  Sodium 135 - 145 mmol/L 142 143 142  Potassium 3.5 - 5.1 mmol/L 3.3(L) 3.3(L) 3.6  Chloride 98 - 111 mmol/L 106 109 104  CO2 22 - 32 mmol/L _1 Calcium 8.9 - 10.3 mg/dL 8.8(L) 8.6(L) 9.2  Total Protein 6.5 - 8.1 g/dL 8.0 7.3 8.1  Total Bilirubin 0.3 - 1.2 mg/dL 0.4 0.2(L) 0.2(L)  Alkaline Phos 38 - 126 U/L 71 74 72  AST 15 - 41 U/L _2 ALT 0 - 44 U/L _3 CBC    Component Value  Date/Time   WBC 10.1 03/02/2020 1255   RBC 3.07 (L) 03/02/2020 1255   RBC 3.10 (L) 03/02/2020 1255   HGB 10.3 (L) 03/02/2020 1255   HGB 6.3 (LL) 11/08/2019 1350   HGB 10.4 (L) 10/04/2016 1154   HCT 30.8 (L) 03/02/2020 1255   HCT 31.3 (L) 10/04/2016 1154   PLT 150 03/02/2020 1255   PLT 10 (L) 11/08/2019 1350   PLT 320 10/04/2016 1154   MCV 99.4 03/02/2020 1255   MCV 95.7 10/04/2016 1154   MCH 33.2 03/02/2020 1255   MCHC 33.4 03/02/2020 1255   RDW 18.3 (H) 03/02/2020 1255   RDW 16.3 (H) 10/04/2016 1154   LYMPHSABS 1.8 03/02/2020 1255   LYMPHSABS 1.7 10/04/2016 1154   MONOABS 0.7 03/02/2020 1255   MONOABS 0.5 10/04/2016 1154   EOSABS 0.1 03/02/2020 1255   EOSABS 0.3 10/04/2016 1154   BASOSABS 0.0 03/02/2020 1255   BASOSABS 0.0 10/04/2016 1154    STUDIES: No results found.   RESEARCH: Referred to PREVENT study, but was pregnant at the time; referred to [facility] 11202, but the biopsied lymph node was benign; referred to weight loss study but declined; referred to [facility] 12/09/2000, but the timing of radiation and 8 was greater than 60 days past the date of diagnosis; referred to health disparity study, but declined; referred to [id number] adjuvant therapy study, but the patient received Xeloda with radiation and therefore was ineligible   ASSESSMENT: 44 y.o. [place] woman status post right breast upper-outer quadrant biopsy 01/28/2016 for a clinical T2 N0 invasive ductal carcinoma, grade 3, triple negative, with an MIB-1 of 70%.  (a) suspicious right axillary lymph node biopsied 01/28/2016 was benign  (1) neoadjuvant chemotherapy: doxorubicin and cyclophosphamide in dose dense fashion 4 started 04/14/16, completed 05/26/2016, followed by paclitaxel and carboplatin weekly 12, Started 06/09/2016  (a) taxol discontinued after 7 doses because of neuropathy, last dose 07/21/2016  (2) genetics testing October 20, 2016 through the 32-gene Comprehensive Cancer Panel offered by  [facility] Junius Roads, MD) (with MSH2 Exons 1-7 Inversion Analysis) found no deleterious mutations or VUSS  In APC, ATM, AXIN2, BARD1, BMPR1A, BRCA1, BRCA2, BRIP1, CDH1, CDK4, CDKN2A, CHEK2, EPCAM, FANCC, MLH1, MSH2, MSH6, MUTYH, NBN, PALB2, PMS2, POLD1, POLE, PTEN, RAD51C, RAD51D, SCG5/GREM1, SMAD4, STK11, TP53, VHL, and XRCC2.    (3) right mastectomy and sentinel lymph node sampling 09/06/2016 showed a residual ypT1c ypN0, invasive ductal carcinoma, grade 3, with negative margins. Repeat prognostic panel again triple negative   (4) adjuvant radiation with capecitabine/Xeloda sensitization 11/15/16 - 01/12/17 Site/dose:   Right Chest Wall and axilla (4 field) treated to 45 Gy in 25 fractions, and then Boosted an additional 14.4 Gy in 8 fractions.  (5) tobacco abuse disorder: The patient quit smoking 04/04/2018  METASTATIC DISEASE:  July 2020: chest wall, bones, nodes (6) nonspecific changes noted on chest CT 06/11/2019 were clarified by PET scan 06/19/2019 showing hypermetabolic disease in the right anterior chest wall, right internal mammary nodes, right and left axillary nodes, but no metastatic disease in the neck, lungs, abdomen or pelvis.  Bone marrow uptake suggests bony metastatic disease.  (a) CARIS requested obtained from 06/28/2019 sample confirmed a triple negativity, the tumor also was negative for the androgen receptor, was MSI stable and mismatch repair status proficient, with a low mutational burden.  BRCA 1 and 2 were negative and PD-L1 was negative.  PI K3 showed a variant of uncertain significance.  However the tumor was genomic LOH high  (b) CA-27-29 is  informative: was 118.3 on 06/04/2019  (7) zoledronate started 07/17/2019--on hold currently due to dental issues, and until 6 weeks at least after dental work  (8) carboplatin/ gemcitabine days 1 and 8 Q21 day cycle started 07/10/2019, last dose 10/30/2019  (a) restaging studies 09/2019 showed no progression  (b)  restaging studies after 6 cycles showed mild disease progression  (9) cyclophosphamide, methotrexate, fluorouracil chemotherapy started 11/13/2019, repeated every 21 days.  (a) discontinued after cycle 3 (12/31/2019): No evidence of response  (10) started capecitabine 01/21/2020, given 1 week on and 1 week off at standard doses (1500 mg twice daily)   PLAN: Rozalynn has had 2 cycles of Doxil, 1 week on 1 week off.  This is really equivalent to 114-day cycle.  I certainly do not see evidence of response but it is very early.  I think it would be prudent before giving up on this medication to continuing for another two 1 week cycles.  She will resume treatment next week and then again the week of April 19.  I am going to obtain an echocardiogram and a chest CT scan before she sees me April 27.  In the case, which may be likely, that we do not see any evidence of response or worse to receive progression, she can proceed directly to Doxil that same day.  She does have a port already in place.  She knows to call us if anything else develops before the next visit.  Total encounter time 25 minutes.Sarajane Jews C. Yamilette Garretson, MD 03/02/20 1:21 PM Medical Oncology and Hematology [facility] [place], [place] [address] Tel. [phone]    Fax. [phone]    I, Wilburn Mylar, am acting as scribe for Dr. [name]. Shealyn Sean.  I, Lurline Del MD, have reviewed the above documentation for accuracy and completeness, and I agree with the above.   *Total Encounter Time as defined by the Centers for Medicare and Medicaid Services includes, in addition to the face-to-face time of a patient visit (documented in the note above) non-face-to-face time: obtaining and reviewing outside history, ordering and reviewing medications, tests or procedures, care coordination (communications with other health care professionals or caregivers) and documentation in the medical record.

## 2020-03-02 ENCOUNTER — Other Ambulatory Visit: Payer: Self-pay

## 2020-03-02 ENCOUNTER — Inpatient Hospital Stay: Payer: Medicaid Other

## 2020-03-02 ENCOUNTER — Telehealth: Payer: Self-pay | Admitting: Oncology

## 2020-03-02 ENCOUNTER — Inpatient Hospital Stay (HOSPITAL_BASED_OUTPATIENT_CLINIC_OR_DEPARTMENT_OTHER): Payer: Medicaid Other | Admitting: Oncology

## 2020-03-02 VITALS — BP 127/81 | HR 83 | Temp 97.8°F | Resp 17 | Ht 61.5 in | Wt 202.0 lb

## 2020-03-02 DIAGNOSIS — C50411 Malignant neoplasm of upper-outer quadrant of right female breast: Secondary | ICD-10-CM

## 2020-03-02 DIAGNOSIS — C7951 Secondary malignant neoplasm of bone: Secondary | ICD-10-CM | POA: Diagnosis not present

## 2020-03-02 DIAGNOSIS — Z171 Estrogen receptor negative status [ER-]: Secondary | ICD-10-CM | POA: Diagnosis not present

## 2020-03-02 DIAGNOSIS — Z7189 Other specified counseling: Secondary | ICD-10-CM

## 2020-03-02 DIAGNOSIS — C50919 Malignant neoplasm of unspecified site of unspecified female breast: Secondary | ICD-10-CM

## 2020-03-02 LAB — CBC WITH DIFFERENTIAL/PLATELET
Abs Immature Granulocytes: 0.03 10*3/uL (ref 0.00–0.07)
Basophils Absolute: 0 10*3/uL (ref 0.0–0.1)
Basophils Relative: 0 %
Eosinophils Absolute: 0.1 10*3/uL (ref 0.0–0.5)
Eosinophils Relative: 1 %
HCT: 30.8 % — ABNORMAL LOW (ref 36.0–46.0)
Hemoglobin: 10.3 g/dL — ABNORMAL LOW (ref 12.0–15.0)
Immature Granulocytes: 0 %
Lymphocytes Relative: 18 %
Lymphs Abs: 1.8 10*3/uL (ref 0.7–4.0)
MCH: 33.2 pg (ref 26.0–34.0)
MCHC: 33.4 g/dL (ref 30.0–36.0)
MCV: 99.4 fL (ref 80.0–100.0)
Monocytes Absolute: 0.7 10*3/uL (ref 0.1–1.0)
Monocytes Relative: 7 %
Neutro Abs: 7.4 10*3/uL (ref 1.7–7.7)
Neutrophils Relative %: 74 %
Platelets: 150 10*3/uL (ref 150–400)
RBC: 3.1 MIL/uL — ABNORMAL LOW (ref 3.87–5.11)
RDW: 18.3 % — ABNORMAL HIGH (ref 11.5–15.5)
WBC: 10.1 10*3/uL (ref 4.0–10.5)
nRBC: 0 % (ref 0.0–0.2)

## 2020-03-02 LAB — RETICULOCYTES
Immature Retic Fract: 12.7 % (ref 2.3–15.9)
RBC.: 3.07 MIL/uL — ABNORMAL LOW (ref 3.87–5.11)
Retic Count, Absolute: 50 10*3/uL (ref 19.0–186.0)
Retic Ct Pct: 1.6 % (ref 0.4–3.1)

## 2020-03-02 LAB — COMPREHENSIVE METABOLIC PANEL
ALT: 10 U/L (ref 0–44)
AST: 15 U/L (ref 15–41)
Albumin: 4 g/dL (ref 3.5–5.0)
Alkaline Phosphatase: 72 U/L (ref 38–126)
Anion gap: 8 (ref 5–15)
BUN: 12 mg/dL (ref 6–20)
CO2: 28 mmol/L (ref 22–32)
Calcium: 9.7 mg/dL (ref 8.9–10.3)
Chloride: 106 mmol/L (ref 98–111)
Creatinine, Ser: 0.92 mg/dL (ref 0.44–1.00)
GFR calc Af Amer: >60 mL/min (ref >60)
GFR calc non Af Amer: >60 mL/min (ref >60)
Glucose, Bld: 113 mg/dL — ABNORMAL HIGH (ref 70–99)
Potassium: 3.5 mmol/L (ref 3.5–5.1)
Sodium: 142 mmol/L (ref 135–145)
Total Bilirubin: 0.3 mg/dL (ref 0.3–1.2)
Total Protein: 8.2 g/dL — ABNORMAL HIGH (ref 6.5–8.1)

## 2020-03-02 LAB — SAMPLE TO BLOOD BANK

## 2020-03-02 LAB — FERRITIN: Ferritin: 527 ng/mL — ABNORMAL HIGH (ref 11–307)

## 2020-03-02 MED ORDER — PROCHLORPERAZINE MALEATE 10 MG PO TABS: 10.0000 mg | ORAL_TABLET | Freq: Four times a day (QID) | ORAL | 1 refills | Status: DC | PRN

## 2020-03-02 MED ORDER — LIDOCAINE-PRILOCAINE 2.5-2.5 % EX CREA: TOPICAL_CREAM | CUTANEOUS | 3 refills | Status: DC

## 2020-03-02 MED FILL — LIDOCAINE-PRILOCAINE CREAM: 2.5-2.5 | 30 days supply | Qty: 30 | Fill #0

## 2020-03-02 NOTE — Progress Notes (Signed)
DISCONTINUE OFF PATHWAY REGIMEN - Breast   [id number]:CMF (IV cyclophosphamide) q21 days:   A cycle is every 21 days:     Cyclophosphamide      Methotrexate      Fluorouracil   **Always confirm dose/schedule in your pharmacy ordering system**  REASON: Other Reason PRIOR TREATMENT: Off Pathway: CMF (IV cyclophosphamide) q21 days TREATMENT RESPONSE: Unable to Evaluate  START ON PATHWAY REGIMEN - Breast     A cycle is every 28 days:     Liposomal doxorubicin   **Always confirm dose/schedule in your pharmacy ordering system**  Patient Characteristics: Distant Metastases or Locoregional Recurrent Disease - Unresected or Locally Advanced Unresectable Disease Progressing after Neoadjuvant and Local Therapies, HER2 Negative/Unknown/Equivocal, ER Negative/Unknown, Chemotherapy, Third Line and Beyond,  Anthracycline Candidate Therapeutic Status: Distant Metastases BRCA Mutation Status: Absent ER Status: Negative (-) HER2 Status: Negative (-) PR Status: Negative (-) Line of Therapy: Third Line and Beyond Intent of Therapy: Non-Curative / Palliative Intent, Discussed with Patient

## 2020-03-02 NOTE — Telephone Encounter (Signed)
Scheduled appt per 3/29 los. Pt confirmed appt date and time.

## 2020-03-03 LAB — CANCER ANTIGEN 27.29: CA 27.29: 411.5 U/mL — ABNORMAL HIGH (ref 0.0–38.6)

## 2020-03-12 MED FILL — CAPECITABINE 500 MG TABLET: 500 | 28 days supply | Qty: 84 | Fill #2

## 2020-03-23 ENCOUNTER — Telehealth: Payer: Self-pay | Admitting: *Deleted

## 2020-03-23 NOTE — Telephone Encounter (Signed)
This RN spoke with pt who states she has not received her chemo medication that was to be mailed to her last week.  She states she received a package but it was for her lidocaine not the Xeloda.  This RN contacted [facility] and was informed that lidocaine was filled on 03/10/2020 and Xeloda on 03/12/2020.  The Xeloda was mailed per [employer] with noted tracking as received on 03/13/2020 at 348 pm.  Address verified.  This RN reiterated that pt firmly states she has not received the Xeloda.  Pharmacist states for pt to call them further per above.  This RN informed Karlisa of conversation including noted tracking information from [facility].  Georgea states she has been home " everyday because I got the kids and the door is even open "  This RN gave Holdyn above number to call for further discussion.

## 2020-03-24 ENCOUNTER — Ambulatory Visit (HOSPITAL_COMMUNITY): Payer: Medicaid Other

## 2020-03-24 ENCOUNTER — Other Ambulatory Visit: Payer: Self-pay

## 2020-03-26 ENCOUNTER — Other Ambulatory Visit (HOSPITAL_COMMUNITY): Payer: Medicaid Other

## 2020-03-26 ENCOUNTER — Telehealth (HOSPITAL_COMMUNITY): Payer: Self-pay | Admitting: Oncology

## 2020-03-26 NOTE — Telephone Encounter (Signed)
Patient No Showed appointment for echocardiogram on 03/26/2020.  I called patient to reschedule and left a message due to no answer. I will continue to call patient to reschedule test.

## 2020-03-30 ENCOUNTER — Telehealth: Payer: Self-pay | Admitting: *Deleted

## 2020-03-30 ENCOUNTER — Telehealth: Payer: Self-pay | Admitting: Oncology

## 2020-03-30 NOTE — Telephone Encounter (Signed)
Scheduled appt per 4/26 sch message - pt is aware of appt change

## 2020-03-31 ENCOUNTER — Inpatient Hospital Stay: Payer: Medicaid Other

## 2020-03-31 ENCOUNTER — Inpatient Hospital Stay: Payer: Medicaid Other | Admitting: Oncology

## 2020-04-01 NOTE — Progress Notes (Signed)
Pharmacist Chemotherapy Monitoring - Initial Assessment    Anticipated start date: 04/07/20    Regimen:  . Are orders appropriate based on the patient's diagnosis, regimen, and cycle? Yes . Does the plan date match the patient's scheduled date? Yes . Is the sequencing of drugs appropriate? Yes . Are the premedications appropriate for the patient's regimen? Yes . Prior Authorization for treatment is: Approved o If applicable, is the correct biosimilar selected based on the patient's insurance? not applicable  Organ Function and Labs: Marland Kitchen Are dose adjustments needed based on the patient's renal function, hepatic function, or hematologic function? Yes . Are appropriate labs ordered prior to the start of patient's treatment? Yes . Other organ system assessment, if indicated: anthracyclines: Echo/ MUGA . The following baseline labs, if indicated, have been ordered: N/A  Dose Assessment: . Are the drug doses appropriate? Yes . Are the following correct: o Drug concentrations Yes o IV fluid compatible with drug Yes o Administration routes Yes o Timing of therapy Yes . If applicable, does the patient have documented access for treatment and/or plans for port-a-cath placement? yes . If applicable, have lifetime cumulative doses been properly documented and assessed? yes Lifetime Dose Tracking  . Doxorubicin: 239.692 mg/m2 (456 mg) = 53.26 % of the maximum lifetime dose of 450 mg/m2  . Carboplatin: 5,480 mg = 0.01 % of the maximum lifetime dose of 999,999,999 mg  o   Toxicity Monitoring/Prevention: . The patient has the following take home antiemetics prescribed: Prochlorperazine, Dexamethasone and Lorazepam . The patient has the following take home medications prescribed: N/A . Medication allergies and previous infusion related reactions, if applicable, have been reviewed and addressed. Yes . The patient's current medication list has been assessed for drug-drug interactions with their  chemotherapy regimen. no significant drug-drug interactions were identified on review.  Order Review: . Are the treatment plan orders signed? Yes . Is the patient scheduled to see a provider prior to their treatment? Yes  I verify that I have reviewed each item in the above checklist and answered each question accordingly. Needs ECHO prior to treatment, message sent to MD  Deloria Brassfield K 04/01/2020 1:42 PM

## 2020-04-02 ENCOUNTER — Ambulatory Visit (HOSPITAL_COMMUNITY)
Admission: RE | Admit: 2020-04-02 | Discharge: 2020-04-02 | Disposition: A | Discharge status: Home / Self Care | Payer: Medicaid Other | Source: Ambulatory Visit | Attending: Oncology | Admitting: Oncology

## 2020-04-02 ENCOUNTER — Other Ambulatory Visit: Payer: Self-pay

## 2020-04-02 ENCOUNTER — Encounter (HOSPITAL_COMMUNITY): Payer: Self-pay

## 2020-04-02 ENCOUNTER — Ambulatory Visit (HOSPITAL_BASED_OUTPATIENT_CLINIC_OR_DEPARTMENT_OTHER)
Admission: RE | Admit: 2020-04-02 | Discharge: 2020-04-02 | Disposition: A | Discharge status: Home / Self Care | Payer: Medicaid Other | Source: Ambulatory Visit | Attending: Oncology | Admitting: Oncology

## 2020-04-02 DIAGNOSIS — C50919 Malignant neoplasm of unspecified site of unspecified female breast: Secondary | ICD-10-CM | POA: Diagnosis not present

## 2020-04-02 DIAGNOSIS — Z171 Estrogen receptor negative status [ER-]: Secondary | ICD-10-CM | POA: Insufficient documentation

## 2020-04-02 DIAGNOSIS — C50411 Malignant neoplasm of upper-outer quadrant of right female breast: Secondary | ICD-10-CM | POA: Diagnosis not present

## 2020-04-02 DIAGNOSIS — C7951 Secondary malignant neoplasm of bone: Secondary | ICD-10-CM | POA: Diagnosis not present

## 2020-04-02 DIAGNOSIS — Z853 Personal history of malignant neoplasm of breast: Secondary | ICD-10-CM | POA: Diagnosis not present

## 2020-04-02 MED ORDER — SODIUM CHLORIDE (PF) 0.9 % IJ SOLN
INTRAMUSCULAR | Status: AC
  Filled 2020-04-02: qty 50

## 2020-04-02 MED ORDER — IOHEXOL 300 MG/ML  SOLN
75.0000 mL | Freq: Once | INTRAMUSCULAR | Status: AC | PRN
  Administered 2020-04-02: 75 mL via INTRAVENOUS

## 2020-04-02 NOTE — Progress Notes (Signed)
  Echocardiogram 2D Echocardiogram has been performed.  Rebecca [name] 04/02/2020, 1:43 PM

## 2020-04-06 ENCOUNTER — Other Ambulatory Visit: Payer: Self-pay | Admitting: Oncology

## 2020-04-06 ENCOUNTER — Telehealth: Payer: Self-pay | Admitting: *Deleted

## 2020-04-06 NOTE — Telephone Encounter (Signed)
This RN spoke with Inez Catalina RN with [facility] Palliative team- stating care should have been reinstate - and presently will be.  She states she has also spoke with [facility] home health who can see the patient this week for assumption of home care needs.  Referral will need to be placed and then [facility] nurse coordinator will pull for initiation of services.  Order placed per above by this RN

## 2020-04-06 NOTE — Progress Notes (Addendum)
[facility]  [phone] [phone]   ID: Rebecca [name] DOB: 12-16-1975  MR#: [id number]  JYN#:[id number]  Patient Care Team: Dorena Dew, FNP as PCP - General (Family Medicine) Alphonsa Overall, MD as Consulting Physician (General Surgery) Magrinat, [name], MD as Consulting Physician (Oncology) Gery Pray, MD as Consulting Physician (Radiation Oncology) Delice Bison, Charlestine Massed, NP as Nurse Practitioner (Hematology and Oncology) Alda Berthold, DO as Consulting Physician (Neurology) OTHER MD:  CHIEF COMPLAINT: triple negative stage IV breast cancer  CURRENT TREATMENT: Doxil   INTERVAL HISTORY: Rebecca [name] returns today for follow-up and treatment of her triple negative breast cancer.    She started Capecitabine BID 1 week on and 1 week off on 01/21/2020.  Her most recent CT chest was completed on 04/02/2020 which showed moderate disease progression in the right chest wall, new chest wall skin thickening, new right axillary adenopathy, and a subtle indeterminate density in the liver.  Due to her progression she is being changed to Doxil, given on day 1 of a 28 day cycle and will start this today.    She also underwent an echocardiogram on 04/02/2020 that showed a stable EF of 50-55%.  It was completed with different echo equipment that her prior echo and therefore repeat in 3 months was recommended.    REVIEW OF SYSTEMS: Rebecca [name] had continued on Capecitabine with good tolerance.  She hasn't noted a definite increase in the size of the tumor.  She hasn't noted an improvement either.  She has been dressing her right chest wall fungating mass with gauze and tape.  She notes that generally she feels well.  She has intermittent pain at the chest wall that is relieved with Tramadol.  This was last filled 12/2018.  She notes she needs it a few times per week and it does not cause excessive sleepiness.    Rebecca [name] denies any fever, chills, chest pain, palpitations,  cough, bowel/bladder changes, nausea, vomiting, fatigue, or other concerns.  A detailed ROS was otherwise non contributory.    BREAST CANCER HISTORY: From the original intake note:  Rebecca [name] noted a change in her right breast sometime around September or October 2016. She did not bring it to intermediate medical attention, but on 01/13/2016 she established [name] in Rebecca [name]' service and she was set up for bilateral diagnostic mammography with tomosynthesis and bilateral ultrasonography at the [facility] 01/19/2016. The breast density was category B. In the upper outer quadrant of the right breast there was a spiculated mass measuring 2.8 cm. On physical exam this was palpable. Targeted ultrasonography confirmed an irregular hypoechoic mass in the right breast 11:30 o'clock position measuring 2.6 cm maximally. Ultrasound of the right axilla showed a morphologically abnormal lymph node.  In the left breast there were some tubular densities behind the areola which by ultrasonography showed benign ductal ectasia.  On 01/28/2016 Rebecca [name] underwent biopsy of the right breast mass and abnormal right axillary lymph node. The pathology from this procedure (S [id number]) showed the lymph node to be benign. In the breast however there was an invasive ductal carcinoma, grade 3, which was estrogen and progesterone receptor negative. The proliferation marker was 70%. HER-2 was not amplified with a signals ratio of 1.32. The number per cell was 2.05.  The patient's subsequent history is as detailed below   PAST MEDICAL HISTORY: Past Medical History:  Diagnosis Date  . Anxiety   . Breast cancer ([place])   . Depression   .  GERD (gastroesophageal reflux disease)   . History of radiation therapy 11/15/16-01/12/17   right chest wall and axilla treated to 45 Gy in 25 fractions, boosted and additional 14 Gy in 8 fractions  . Hypertension    diet controlled  . Obesity (BMI 35.0-39.9 without comorbidity)   .  Pneumonia    as a child  . Seasonal allergies   . Sickle cell trait ([place])   . Termination of pregnancy (fetus) 04/02/16    PAST SURGICAL HISTORY: Past Surgical History:  Procedure Laterality Date  . CESAREAN SECTION     2004 and 2007  . MASTECTOMY W/ SENTINEL NODE BIOPSY Right 09/06/2016   Procedure: RIGHT BREAST MASTECTOMY WITH RIGHT AXILLARY SENTINEL LYMPH NODE BIOPSY;  Surgeon: Alphonsa Overall, MD;  Location: [facility];  Service: General;  Laterality: Right;  . PORT-A-CATH REMOVAL Left 09/06/2016   Procedure: REMOVAL PORT-A-CATH;  Surgeon: Alphonsa Overall, MD;  Location: [facility];  Service: General;  Laterality: Left;  . PORTACATH PLACEMENT    . PORTACATH PLACEMENT N/A 07/09/2019   Procedure: INSERTION PORT-A-CATH WITH ULTRASOUND;  Surgeon: Alphonsa Overall, MD;  Location: [facility];  Service: General;  Laterality: N/A;    FAMILY HISTORY Family History  Problem Relation Age of Onset  . Hypertension Mother   . Cancer Mother        dx "intestinal cancer" in her 27s; +surgery  . Other Mother        hysterectomy at young age for unspecified cause  . Heart Problems Mother   . Breast cancer Cousin        maternal 1st cousin dx female breast cancer at 73-46y  . Cancer Father   . Hypertension Father   . Heart Problems Maternal Aunt   . Diabetes Maternal Aunt   . Breast cancer Maternal Uncle        dx 64-65  . Heart Problems Maternal Uncle   . Breast cancer Maternal Grandmother 11  . Throat cancer Maternal Grandfather        d. 76s; smoker  . Sickle cell anemia Paternal Aunt   . Congestive Heart Failure Maternal Aunt   . Multiple sclerosis Cousin   . Cancer Other        maternal great uncle (MGM's brother); cancer removed from his side  . Heart attack Paternal Aunt        d. early 7s  The patient has very little information about her father. Her mother is currently 41 years old. She had a history of cervical cancer at age 79. The patient had 2 brothers, no  sisters. The maternal grandfather had throat cancer. A maternal uncle was diagnosed with breast cancer as well as prostate cancer at the age of 41. 2 maternal cousins, one of them female, had breast cancer as well.   GYNECOLOGIC HISTORY:  No LMP recorded. Menarche age 21, first live birth age 37. The patient is GX P4. She was still having regular periods at the time of diagnosis. She took oral contraceptives in the 1990s with no side effects.--.  Stopped with chemotherapy and have not resumed as of May 2019   SOCIAL HISTORY:  (Updated 10/2018) She works as a Market researcher. The patient's significant other Dwayne [name] works at [employer].  At home with the patient are her 3 children Rebecca [name], Rebecca [name] and Rebecca [name]. There are age 35, 74, and 51 as of November 2019.  2 of them are disabled or have significant  health problems, one with sickle cell disease, the other with autism and developmental delay.  The patient's son Rebecca [name], currently 29 years old, lives in [place].    ADVANCED DIRECTIVES: Not in place   HEALTH MAINTENANCE: Social History   Tobacco Use  . Smoking status: Former Smoker    Packs/day: 1.00    Years: 20.00    Pack years: 20.00    Types: Cigarettes    Quit date: 04/01/2018    Years since quitting: 2.0  . Smokeless tobacco: Never Used  . Tobacco comment: Patient has quit smoking x 1 year now  Substance Use Topics  . Alcohol use: Yes    Comment: occ  . Drug use: No    Colonoscopy:  PAP:  Bone density:  Lipid panel:  No Known Allergies  Current Outpatient Medications on File Prior to Visit  Medication Sig Dispense Refill  . calcium carbonate (TUMS - DOSED IN MG ELEMENTAL CALCIUM) 500 MG chewable tablet Chew 1 tablet by mouth daily as needed for indigestion or heartburn.    . lidocaine-prilocaine (EMLA) cream Apply to affected area once 30 g 3  . LORazepam (ATIVAN) 0.5 MG tablet Take 1 tablet (0.5 mg total) by mouth daily as  needed for anxiety. 30 tablet 0  . prochlorperazine (COMPAZINE) 10 MG tablet Take 1 tablet (10 mg total) by mouth every 6 (six) hours as needed (Nausea or vomiting). 30 tablet 1  . valACYclovir (VALTREX) 1000 MG tablet Take 1 tablet (1,000 mg total) by mouth daily. 90 tablet 1   No current facility-administered medications on file prior to visit.    OBJECTIVE: African-American woman who appears stated age  42:   04/07/20 1227  BP: 125/75  Pulse: 84  Resp: 18  Temp: 98 F (36.7 C)  SpO2: 100%   Wt Readings from Last 3 Encounters:  04/07/20 207 lb 3.2 oz (94 kg)  03/02/20 202 lb (91.6 kg)  02/11/20 198 lb 3.2 oz (89.9 kg)   Body mass index is 38.52 kg/m.    ECOG FS:1 - Symptomatic but completely ambulatory GENERAL: Patient is a well appearing female in no acute distress HEENT:  Sclerae anicteric. Mask in place. Neck is supple.  NODES:  No cervical, supraclavicular, or axillary lymphadenopathy palpated.  BREAST EXAM:  Fungating lesion to right chest wall slightly worse than imaged below, left upper outer breast with palpable 2cm mass. LUNGS:  Clear to auscultation bilaterally.  No wheezes or rhonchi. HEART:  Regular rate and rhythm. No murmur appreciated. ABDOMEN:  Soft, nontender.  Positive, normoactive bowel sounds. No organomegaly palpated. MSK:  No focal spinal tenderness to palpation. EXTREMITIES:  No peripheral edema.   SKIN:  Clear with no obvious rashes or skin changes. No nail dyscrasia. NEURO:  Nonfocal. Well oriented.  Appropriate affect.     Right chest wall 02/11/2020.     Right chest wall 12/26/2019:     Right chest wall area 11/22/2019, which is the new baseline   LAB RESULTS: CMP Latest Ref Rng & Units 04/07/2020 03/02/2020 02/11/2020  Glucose 70 - 99 mg/dL 116(H) 113(H) 107(H)  BUN 6 - 20 mg/dL 12 12 10   Creatinine 0.44 - 1.00 mg/dL 0.99 0.92 0.83  Sodium 135 - 145 mmol/L 143 142 142  Potassium 3.5 - 5.1 mmol/L 3.7 3.5 3.3(L)  Chloride 98 -  111 mmol/L 107 106 106  CO2 22 - 32 mmol/L 28 28 28   Calcium 8.9 - 10.3 mg/dL 9.4 9.7 8.8(L)  Total Protein 6.5 -  8.1 g/dL 7.7 8.2(H) 8.0  Total Bilirubin 0.3 - 1.2 mg/dL 0.3 0.3 0.4  Alkaline Phos 38 - 126 U/L 74 72 71  AST 15 - 41 U/L 15 15 17   ALT 0 - 44 U/L 10 10 10     CBC    Component Value Date/Time   WBC 9.5 04/07/2020 1203   RBC 3.14 (L) 04/07/2020 1204   RBC 3.12 (L) 04/07/2020 1203   HGB 10.5 (L) 04/07/2020 1203   HGB 6.3 (LL) 11/08/2019 1350   HGB 10.4 (L) 10/04/2016 1154   HCT 31.2 (L) 04/07/2020 1203   HCT 31.3 (L) 10/04/2016 1154   PLT 155 04/07/2020 1203   PLT 10 (L) 11/08/2019 1350   PLT 320 10/04/2016 1154   MCV 100.0 04/07/2020 1203   MCV 95.7 10/04/2016 1154   MCH 33.7 04/07/2020 1203   MCHC 33.7 04/07/2020 1203   RDW 16.2 (H) 04/07/2020 1203   RDW 16.3 (H) 10/04/2016 1154   LYMPHSABS 1.8 04/07/2020 1203   LYMPHSABS 1.7 10/04/2016 1154   MONOABS 0.8 04/07/2020 1203   MONOABS 0.5 10/04/2016 1154   EOSABS 0.1 04/07/2020 1203   EOSABS 0.3 10/04/2016 1154   BASOSABS 0.0 04/07/2020 1203   BASOSABS 0.0 10/04/2016 1154    STUDIES: CT Chest W Contrast  Result Date: 04/02/2020 CLINICAL DATA:  Breast cancer diagnosed in 2016 with recurrence in 2020. Right mastectomy. Radiation therapy complete. Chemotherapy in progress. EXAM: CT CHEST WITH CONTRAST TECHNIQUE: Multidetector CT imaging of the chest was performed during intravenous contrast administration. CONTRAST:  56m OMNIPAQUE IOHEXOL 300 MG/ML  SOLN COMPARISON:  11/13/2019 FINDINGS: Cardiovascular: Left-sided Port-A-Cath terminates at the high right atrium. Normal aortic caliber. Normal heart size, without pericardial effusion. No central pulmonary embolism, on this non-dedicated study. Mediastinum/Nodes: New right axillary adenopathy. 1.1 cm on 39/2 versus 5 mm on the prior. Index left axillary nodal mass measures 2.3 x 4.4 cm on 58/2 versus 1.4 x 2.9 cm on the prior exam (when remeasured). Left subpectoral  node measures 1.4 cm on 65/2 versus 9 mm on the prior. No mediastinal or hilar adenopathy. Right internal mammary soft tissue fullness, likely related to adenopathy. 8 mm on 45/2 versus 4 mm on the prior exam. Prevascular increased density including on 52/2, likely due to residual thymic tissue. Lungs/Pleura: No pleural fluid.  Clear lungs. Upper Abdomen: Diffusely heterogeneous density including within the hepatic dome is subtle and nonspecific. Example 95/2. Normal imaged portions of the spleen, stomach, pancreas, gallbladder, adrenal glands, kidneys. Musculoskeletal: Right mastectomy. New left breast lesions, with incomplete imaging of the left breast. Example laterally at 1.6 cm on 61/2. Right chest wall progression. Soft tissue mass which is contiguous with the pectoralis musculature measures on the order of 3.8 x 3.1 cm today versus 3.7 x 2.2 cm on the prior exam (when remeasured). New or progressive chest wall skin thickening and irregularity, including at 1.3 cm on 60/2. IMPRESSION: 1. Moderate disease progression, as evidenced by increased left and new right axillary adenopathy. New left breast masses and progressive right chest wall disease. 2. Right internal mammary soft tissue fullness, likely due to progressive adenopathy. 3. Heterogeneous hepatic dome density is subtle and could simply be related to steatosis and the phase of bolus timing. This could either be re-evaluated at follow-up or more entirely characterized with dedicated pre and post contrast abdominal MRI. Electronically Signed   By: KAbigail MiyamotoM.D.   On: 04/02/2020 15:12   ECHOCARDIOGRAM COMPLETE  Result Date: 04/03/2020  ECHOCARDIOGRAM REPORT   Patient Name:   NATTALIE SANTIESTEBAN POTTS-HAIRSTON Date of Exam: 04/02/2020 Medical Rec #:  [id number]                  Height:       61.5 in Accession #:    [id number]                 Weight:       202.0 lb Date of Birth:  1976/06/19                   BSA:          1.908 m Patient Age:    8 years                    BP:           110/75 mmHg Patient Gender: F                          HR:           80 bpm. Exam Location:  Outpatient Procedure: 2D Echo Indications:    chemotherapy evaluation  History:        Patient has prior history of Echocardiogram examinations, most                 recent 08/03/2016. Risk Factors:Hypertension. Breast cancer.  Sonographer:    Jannett Celestine RDCS (AE) Referring Phys: [id number] Chauncey Cruel  Sonographer Comments: Image acquisition challenging due to patient body habitus. IMPRESSIONS  1. Since the prior study on 08/03/2016 LVEF has slightly decreased from 55-60% to 50-55%. GLS was performed on different softwares and is not comparable. Consider repeating study in 3 months.  2. Left ventricular ejection fraction, by estimation, is 50 to 55%. The left ventricle has low normal function. The left ventricle has no regional wall motion abnormalities. Left ventricular diastolic parameters were normal. The average left ventricular  global longitudinal strain is -11.5 %.  3. Right ventricular systolic function is normal. The right ventricular size is normal.  4. The mitral valve is normal in structure. Trivial mitral valve regurgitation. No evidence of mitral stenosis.  5. The aortic valve is normal in structure. Aortic valve regurgitation is not visualized. No aortic stenosis is present.  6. The inferior vena cava is normal in size with greater than 50% respiratory variability, suggesting right atrial pressure of 3 mmHg. FINDINGS  Left Ventricle: Left ventricular ejection fraction, by estimation, is 50 to 55%. The left ventricle has low normal function. The left ventricle has no regional wall motion abnormalities. The average left ventricular global longitudinal strain is -11.5 %. The left ventricular internal cavity size was normal in size. There is no left ventricular hypertrophy. Left ventricular diastolic parameters were normal. Right Ventricle: The right ventricular size is normal. No  increase in right ventricular wall thickness. Right ventricular systolic function is normal. Left Atrium: Left atrial size was normal in size. Right Atrium: Right atrial size was normal in size. Pericardium: There is no evidence of pericardial effusion. Mitral Valve: The mitral valve is normal in structure. Normal mobility of the mitral valve leaflets. Trivial mitral valve regurgitation. No evidence of mitral valve stenosis. Tricuspid Valve: The tricuspid valve is normal in structure. Tricuspid valve regurgitation is not demonstrated. No evidence of tricuspid stenosis. Aortic Valve: The aortic valve is normal in structure. Aortic valve regurgitation is not visualized. No aortic stenosis is present. Pulmonic  Valve: The pulmonic valve was normal in structure. Pulmonic valve regurgitation is not visualized. No evidence of pulmonic stenosis. Aorta: The aortic root is normal in size and structure. Venous: The inferior vena cava is normal in size with greater than 50% respiratory variability, suggesting right atrial pressure of 3 mmHg. IAS/Shunts: No atrial level shunt detected by color flow Doppler.  LEFT VENTRICLE PLAX 2D LVIDd:         4.90 cm  Diastology LVIDs:         3.10 cm  LV e' lateral:   9.57 cm/s LV PW:         0.70 cm  LV E/e' lateral: 11.4 LV IVS:        0.90 cm  LV e' medial:    7.51 cm/s LVOT diam:     2.00 cm  LV E/e' medial:  14.5 LV SV:         49 LV SV Index:   26       2D Longitudinal Strain LVOT Area:     3.14 cm 2D Strain GLS Avg:     -11.5 %  RIGHT VENTRICLE RV S prime:     12.20 cm/s TAPSE (M-mode): 1.4 cm LEFT ATRIUM             Index       RIGHT ATRIUM           Index LA diam:        3.10 cm 1.62 cm/m  RA Area:     11.10 cm LA Vol (A2C):   35.5 ml 18.60 ml/m RA Volume:   24.20 ml  12.68 ml/m LA Vol (A4C):   24.5 ml 12.84 ml/m LA Biplane Vol: 29.7 ml 15.56 ml/m  AORTIC VALVE LVOT Vmax:   85.00 cm/s LVOT Vmean:  64.100 cm/s LVOT VTI:    0.157 m  AORTA Ao Root diam: 2.80 cm MITRAL VALVE MV  Area (PHT): 3.54 cm     SHUNTS MV Decel Time: 214 msec     Systemic VTI:  0.16 m MV E velocity: 109.00 cm/s  Systemic Diam: 2.00 cm MV A velocity: 73.70 cm/s MV E/A ratio:  1.48 Ena Dawley MD Electronically signed by Ena Dawley MD Signature Date/Time: 04/03/2020/12:53:52 PM    Final      RESEARCH: Referred to PREVENT study, but was pregnant at the time; referred to [facility] 11202, but the biopsied lymph node was benign; referred to weight loss study but declined; referred to [facility] 12/09/2000, but the timing of radiation and 8 was greater than 60 days past the date of diagnosis; referred to health disparity study, but declined; referred to [id number] adjuvant therapy study, but the patient received Xeloda with radiation and therefore was ineligible   ASSESSMENT: 44 y.o. [place] woman status post right breast upper-outer quadrant biopsy 01/28/2016 for a clinical T2 N0 invasive ductal carcinoma, grade 3, triple negative, with an MIB-1 of 70%.  (a) suspicious right axillary lymph node biopsied 01/28/2016 was benign  (1) neoadjuvant chemotherapy: doxorubicin and cyclophosphamide in dose dense fashion 4 started 04/14/16, completed 05/26/2016, followed by paclitaxel and carboplatin weekly 12, Started 06/09/2016  (a) taxol discontinued after 7 doses because of neuropathy, last dose 07/21/2016  (2) genetics testing October 20, 2016 through the 32-gene Comprehensive Cancer Panel offered by [facility] Junius Roads, MD) (with MSH2 Exons 1-7 Inversion Analysis) found no deleterious mutations or VUSS  In APC, ATM, AXIN2, BARD1, BMPR1A, BRCA1, BRCA2, BRIP1, CDH1, CDK4, CDKN2A, CHEK2, EPCAM, FANCC,  MLH1, MSH2, MSH6, MUTYH, NBN, PALB2, PMS2, POLD1, POLE, PTEN, RAD51C, RAD51D, SCG5/GREM1, SMAD4, STK11, TP53, VHL, and XRCC2.    (3) right mastectomy and sentinel lymph node sampling 09/06/2016 showed a residual ypT1c ypN0, invasive ductal carcinoma, grade 3, with negative margins. Repeat  prognostic panel again triple negative   (4) adjuvant radiation with capecitabine/Xeloda sensitization 11/15/16 - 01/12/17 Site/dose:   Right Chest Wall and axilla (4 field) treated to 45 Gy in 25 fractions, and then Boosted an additional 14.4 Gy in 8 fractions.  (5) tobacco abuse disorder: The patient quit smoking 04/04/2018  METASTATIC DISEASE:  July 2020: chest wall, bones, nodes (6) nonspecific changes noted on chest CT 06/11/2019 were clarified by PET scan 06/19/2019 showing hypermetabolic disease in the right anterior chest wall, right internal mammary nodes, right and left axillary nodes, but no metastatic disease in the neck, lungs, abdomen or pelvis.  Bone marrow uptake suggests bony metastatic disease.  (a) CARIS requested obtained from 06/28/2019 sample confirmed a triple negativity, the tumor also was negative for the androgen receptor, was MSI stable and mismatch repair status proficient, with a low mutational burden.  BRCA 1 and 2 were negative and PD-L1 was negative.  PI K3 showed a variant of uncertain significance.  However the tumor was genomic LOH high  (b) CA-27-29 is informative: was 118.3 on 06/04/2019  (7) zoledronate started 07/17/2019--on hold currently due to dental issues, and until 6 weeks at least after dental work  (8) carboplatin/ gemcitabine days 1 and 8 Q21 day cycle started 07/10/2019, last dose 10/30/2019  (a) restaging studies 09/2019 showed no progression  (b) restaging studies after 6 cycles showed mild disease progression  (9) cyclophosphamide, methotrexate, fluorouracil chemotherapy started 11/13/2019, repeated every 21 days.  (a) discontinued after cycle 3 (12/31/2019): No evidence of response  (10) started capecitabine 01/21/2020, given 1 week on and 1 week off at standard doses (1500 mg twice daily)   (a) Discontinued after almost three months of treatment (last on 04/06/2020)  (b) Disease progression on 4/27 CT scan showing increasing right chest wall  disease, left breast lesions, and ? Liver involvement.  (c) MRI liver pending.  (11) Started Doxil given on day 1 of a 28 day cycle   (a) echo on 04/02/2020 shows EF of 50-55%, recommends repeat in 06/2020   PLAN: Deyona and I reviewed her CT scan results.  These demonstrated progression of disease.  Her tumor is worse on her chest wall, she has new right axillary adenopathy, new left breast lesions, and ? Liver involvement that will require MRI to fully evaluate.  She is understandably concerned.    She is switching to receive IV chemotherapy with Doxil.  She met with Dr. [name] today to review this in detail.  He discussed risks and benefits in detail and she is agreeable to treatment.   I refilled her Tramadol.  Her refills are appropriate, and PMP aware was reviewed.  She is remaining functional when she takes tramadol and we reviewed goals of pain management which she understands.   Dr. [name] recommended that she get a second opinion, and Charlette would like to be evaluated at [facility].  We will place that referral.    Ceana will return in 2 weeks for labs and f/u with Dr. [name].  She was recommended to continue with the appropriate pandemic precautions. She knows to call for any questions that may arise between now and her next appointment.  We are happy to see her sooner if needed.  Total encounter time 30 minutes.Wilber Bihari, NP 04/07/20 1:05 PM Medical Oncology and Hematology [facility] [place], [place] [address] Tel. [phone]    Fax. [phone]   ADDENDUM: Rebecca [name] situation is difficult and we have not yet found the right treatment for her.  I am hopeful the Doxil will give [place] a response.  If not the next step I think would be Trodelvy/sacituzumab govitecan.  I think she will be reassured by receiving a second opinion.  She is interested in going to [facility] and we will try to facilitate that for her.  On 04/14/2020 the patient underwent  liver MRI which thankfully showed no evidence of liver metastases.  She does have cholelithiasis without cholecystitis.  There was no abdominal adenopathy.  No aggressive bony lesions were noted  I personally saw this patient and performed a substantive portion of this encounter with the listed APP documented above.   Chauncey Cruel, MD Medical Oncology and Hematology [facility] [address] [place], [place] [address] Tel. [phone]    Fax. [phone]   *Total Encounter Time as defined by the Centers for Medicare and Medicaid Services includes, in addition to the face-to-face time of a patient visit (documented in the note above) non-face-to-face time: obtaining and reviewing outside history, ordering and reviewing medications, tests or procedures, care coordination (communications with other health care professionals or caregivers) and documentation in the medical record.

## 2020-04-07 ENCOUNTER — Inpatient Hospital Stay: Payer: Medicaid Other

## 2020-04-07 ENCOUNTER — Inpatient Hospital Stay (HOSPITAL_BASED_OUTPATIENT_CLINIC_OR_DEPARTMENT_OTHER): Payer: Medicaid Other | Admitting: Medical

## 2020-04-07 ENCOUNTER — Inpatient Hospital Stay (HOSPITAL_BASED_OUTPATIENT_CLINIC_OR_DEPARTMENT_OTHER): Payer: Medicaid Other | Admitting: Adult Health

## 2020-04-07 ENCOUNTER — Inpatient Hospital Stay: Payer: Medicaid Other | Attending: Oncology

## 2020-04-07 ENCOUNTER — Other Ambulatory Visit: Payer: Self-pay

## 2020-04-07 ENCOUNTER — Encounter: Payer: Self-pay | Admitting: Adult Health

## 2020-04-07 VITALS — BP 111/76 | HR 100 | Temp 98.0°F | Resp 18

## 2020-04-07 VITALS — BP 125/75 | HR 84 | Temp 98.0°F | Resp 18 | Ht 61.5 in | Wt 207.2 lb

## 2020-04-07 DIAGNOSIS — Z171 Estrogen receptor negative status [ER-]: Secondary | ICD-10-CM

## 2020-04-07 DIAGNOSIS — C50919 Malignant neoplasm of unspecified site of unspecified female breast: Secondary | ICD-10-CM

## 2020-04-07 DIAGNOSIS — D573 Sickle-cell trait: Secondary | ICD-10-CM | POA: Insufficient documentation

## 2020-04-07 DIAGNOSIS — R590 Localized enlarged lymph nodes: Secondary | ICD-10-CM | POA: Diagnosis not present

## 2020-04-07 DIAGNOSIS — T8090XA Unspecified complication following infusion and therapeutic injection, initial encounter: Secondary | ICD-10-CM

## 2020-04-07 DIAGNOSIS — C7951 Secondary malignant neoplasm of bone: Secondary | ICD-10-CM

## 2020-04-07 DIAGNOSIS — Z87891 Personal history of nicotine dependence: Secondary | ICD-10-CM | POA: Diagnosis not present

## 2020-04-07 DIAGNOSIS — Z7189 Other specified counseling: Secondary | ICD-10-CM

## 2020-04-07 DIAGNOSIS — C787 Secondary malignant neoplasm of liver and intrahepatic bile duct: Secondary | ICD-10-CM

## 2020-04-07 DIAGNOSIS — C50411 Malignant neoplasm of upper-outer quadrant of right female breast: Secondary | ICD-10-CM

## 2020-04-07 DIAGNOSIS — I10 Essential (primary) hypertension: Secondary | ICD-10-CM | POA: Insufficient documentation

## 2020-04-07 DIAGNOSIS — F418 Other specified anxiety disorders: Secondary | ICD-10-CM | POA: Diagnosis not present

## 2020-04-07 DIAGNOSIS — Z803 Family history of malignant neoplasm of breast: Secondary | ICD-10-CM | POA: Insufficient documentation

## 2020-04-07 DIAGNOSIS — E669 Obesity, unspecified: Secondary | ICD-10-CM | POA: Insufficient documentation

## 2020-04-07 DIAGNOSIS — Z5111 Encounter for antineoplastic chemotherapy: Secondary | ICD-10-CM | POA: Diagnosis not present

## 2020-04-07 DIAGNOSIS — Z79899 Other long term (current) drug therapy: Secondary | ICD-10-CM | POA: Insufficient documentation

## 2020-04-07 DIAGNOSIS — K219 Gastro-esophageal reflux disease without esophagitis: Secondary | ICD-10-CM | POA: Insufficient documentation

## 2020-04-07 LAB — CBC WITH DIFFERENTIAL/PLATELET
Abs Immature Granulocytes: 0.06 10*3/uL (ref 0.00–0.07)
Basophils Absolute: 0 10*3/uL (ref 0.0–0.1)
Basophils Relative: 0 %
Eosinophils Absolute: 0.1 10*3/uL (ref 0.0–0.5)
Eosinophils Relative: 1 %
HCT: 31.2 % — ABNORMAL LOW (ref 36.0–46.0)
Hemoglobin: 10.5 g/dL — ABNORMAL LOW (ref 12.0–15.0)
Immature Granulocytes: 1 %
Lymphocytes Relative: 19 %
Lymphs Abs: 1.8 10*3/uL (ref 0.7–4.0)
MCH: 33.7 pg (ref 26.0–34.0)
MCHC: 33.7 g/dL (ref 30.0–36.0)
MCV: 100 fL (ref 80.0–100.0)
Monocytes Absolute: 0.8 10*3/uL (ref 0.1–1.0)
Monocytes Relative: 8 %
Neutro Abs: 6.7 10*3/uL (ref 1.7–7.7)
Neutrophils Relative %: 71 %
Platelets: 155 10*3/uL (ref 150–400)
RBC: 3.12 MIL/uL — ABNORMAL LOW (ref 3.87–5.11)
RDW: 16.2 % — ABNORMAL HIGH (ref 11.5–15.5)
WBC: 9.5 10*3/uL (ref 4.0–10.5)
nRBC: 0 % (ref 0.0–0.2)

## 2020-04-07 LAB — COMPREHENSIVE METABOLIC PANEL
ALT: 10 U/L (ref 0–44)
AST: 15 U/L (ref 15–41)
Albumin: 3.8 g/dL (ref 3.5–5.0)
Alkaline Phosphatase: 74 U/L (ref 38–126)
Anion gap: 8 (ref 5–15)
BUN: 12 mg/dL (ref 6–20)
CO2: 28 mmol/L (ref 22–32)
Calcium: 9.4 mg/dL (ref 8.9–10.3)
Chloride: 107 mmol/L (ref 98–111)
Creatinine, Ser: 0.99 mg/dL (ref 0.44–1.00)
GFR calc Af Amer: >60 mL/min (ref >60)
GFR calc non Af Amer: >60 mL/min (ref >60)
Glucose, Bld: 116 mg/dL — ABNORMAL HIGH (ref 70–99)
Potassium: 3.7 mmol/L (ref 3.5–5.1)
Sodium: 143 mmol/L (ref 135–145)
Total Bilirubin: 0.3 mg/dL (ref 0.3–1.2)
Total Protein: 7.7 g/dL (ref 6.5–8.1)

## 2020-04-07 LAB — RETICULOCYTES
Immature Retic Fract: 16.9 % — ABNORMAL HIGH (ref 2.3–15.9)
RBC.: 3.14 MIL/uL — ABNORMAL LOW (ref 3.87–5.11)
Retic Count, Absolute: 42.1 10*3/uL (ref 19.0–186.0)
Retic Ct Pct: 1.3 % (ref 0.4–3.1)

## 2020-04-07 LAB — SAMPLE TO BLOOD BANK

## 2020-04-07 LAB — FERRITIN: Ferritin: 422 ng/mL — ABNORMAL HIGH (ref 11–307)

## 2020-04-07 MED ORDER — HEPARIN SOD (PORK) LOCK FLUSH 100 UNIT/ML IV SOLN
500.0000 [IU] | Freq: Once | INTRAVENOUS | Status: AC | PRN
  Administered 2020-04-07: 18:00:00 500 [IU]
  Filled 2020-04-07: qty 5

## 2020-04-07 MED ORDER — DEXTROSE 5 % IV SOLN
Freq: Once | INTRAVENOUS | Status: AC
  Filled 2020-04-07: qty 250

## 2020-04-07 MED ORDER — FAMOTIDINE IN NACL 20-0.9 MG/50ML-% IV SOLN
20.0000 mg | Freq: Once | INTRAVENOUS | Status: AC
  Administered 2020-04-07: 15:00:00 20 mg via INTRAVENOUS

## 2020-04-07 MED ORDER — LORAZEPAM 2 MG/ML IJ SOLN
INTRAMUSCULAR | Status: AC
  Filled 2020-04-07: qty 1

## 2020-04-07 MED ORDER — MORPHINE SULFATE (PF) 4 MG/ML IV SOLN
INTRAVENOUS | Status: AC
  Filled 2020-04-07: qty 1

## 2020-04-07 MED ORDER — ATROPINE SULFATE 1 MG/ML IJ SOLN
0.4000 mg | Freq: Once | INTRAMUSCULAR | Status: AC
  Administered 2020-04-07: 0.4 mg via INTRAVENOUS

## 2020-04-07 MED ORDER — SODIUM CHLORIDE 0.9 % IV SOLN
10.0000 mg | Freq: Once | INTRAVENOUS | Status: AC
  Administered 2020-04-07: 14:00:00 10 mg via INTRAVENOUS
  Filled 2020-04-07: qty 10

## 2020-04-07 MED ORDER — SODIUM CHLORIDE 0.9% FLUSH
10.0000 mL | INTRAVENOUS | Status: DC | PRN
  Administered 2020-04-07: 10 mL
  Filled 2020-04-07: qty 10

## 2020-04-07 MED ORDER — DOXORUBICIN HCL LIPOSOMAL CHEMO INJECTION 2 MG/ML
50.0000 mg/m2 | Freq: Once | INTRAVENOUS | Status: AC
  Administered 2020-04-07: 15:00:00 100 mg via INTRAVENOUS
  Filled 2020-04-07: qty 50

## 2020-04-07 MED ORDER — ATROPINE SULFATE 0.4 MG/ML IJ SOLN
INTRAMUSCULAR | Status: AC
  Filled 2020-04-07: qty 1

## 2020-04-07 MED ORDER — DIPHENHYDRAMINE HCL 50 MG/ML IJ SOLN
25.0000 mg | Freq: Once | INTRAMUSCULAR | Status: AC
  Administered 2020-04-07: 25 mg via INTRAVENOUS

## 2020-04-07 MED ORDER — LORAZEPAM 2 MG/ML IJ SOLN
0.5000 mg | Freq: Once | INTRAMUSCULAR | Status: AC
  Administered 2020-04-07: 0.5 mg via INTRAVENOUS

## 2020-04-07 MED ORDER — MORPHINE SULFATE 4 MG/ML IJ SOLN
2.0000 mg | Freq: Once | INTRAMUSCULAR | Status: AC
  Administered 2020-04-07: 2 mg via INTRAVENOUS
  Filled 2020-04-07: qty 1

## 2020-04-07 MED ORDER — TRAMADOL HCL 50 MG PO TABS: 50.0000 mg | ORAL_TABLET | Freq: Four times a day (QID) | ORAL | 0 refills | Status: DC | PRN

## 2020-04-07 NOTE — Patient Instructions (Signed)
Doxorubicin injection What is this medicine? DOXORUBICIN (dox oh ROO bi sin) is a chemotherapy drug. It is used to treat many kinds of cancer like leukemia, lymphoma, neuroblastoma, sarcoma, and Wilms' tumor. It is also used to treat bladder cancer, breast cancer, lung cancer, ovarian cancer, stomach cancer, and thyroid cancer. This medicine may be used for other purposes; ask your health care provider or pharmacist if you have questions. COMMON BRAND NAME(S): Adriamycin, Adriamycin PFS, Adriamycin RDF, Rubex What should I tell my health care provider before I take this medicine? They need to know if you have any of these conditions:  heart disease  history of low blood counts caused by a medicine  liver disease  recent or ongoing radiation therapy  an unusual or allergic reaction to doxorubicin, other chemotherapy agents, other medicines, foods, dyes, or preservatives  pregnant or trying to get pregnant  breast-feeding How should I use this medicine? This drug is given as an infusion into a vein. It is administered in a hospital or clinic by a specially trained health care professional. If you have pain, swelling, burning or any unusual feeling around the site of your injection, tell your health care professional right away. Talk to your pediatrician regarding the use of this medicine in children. Special care may be needed. Overdosage: If you think you have taken too much of this medicine contact a poison control center or emergency room at once. NOTE: This medicine is only for you. Do not share this medicine with others. What if I miss a dose? It is important not to miss your dose. Call your doctor or health care professional if you are unable to keep an appointment. What may interact with this medicine? This medicine may interact with the following medications:  6-mercaptopurine  paclitaxel  phenytoin  St. John's Wort  trastuzumab  verapamil This list may not describe  all possible interactions. Give your health care provider a list of all the medicines, herbs, non-prescription drugs, or dietary supplements you use. Also tell them if you smoke, drink alcohol, or use illegal drugs. Some items may interact with your medicine. What should I watch for while using this medicine? This drug may make you feel generally unwell. This is not uncommon, as chemotherapy can affect healthy cells as well as cancer cells. Report any side effects. Continue your course of treatment even though you feel ill unless your doctor tells you to stop. There is a maximum amount of this medicine you should receive throughout your life. The amount depends on the medical condition being treated and your overall health. Your doctor will watch how much of this medicine you receive in your lifetime. Tell your doctor if you have taken this medicine before. You may need blood work done while you are taking this medicine. Your urine may turn red for a few days after your dose. This is not blood. If your urine is dark or brown, call your doctor. In some cases, you may be given additional medicines to help with side effects. Follow all directions for their use. Call your doctor or health care professional for advice if you get a fever, chills or sore throat, or other symptoms of a cold or flu. Do not treat yourself. This drug decreases your body's ability to fight infections. Try to avoid being around people who are sick. This medicine may increase your risk to bruise or bleed. Call your doctor or health care professional if you notice any unusual bleeding. Talk to your doctor   about your risk of cancer. You may be more at risk for certain types of cancers if you take this medicine. Do not become pregnant while taking this medicine or for 6 months after stopping it. Women should inform their doctor if they wish to become pregnant or think they might be pregnant. Men should not father a child while taking this  medicine and for 6 months after stopping it. There is a potential for serious side effects to an unborn child. Talk to your health care professional or pharmacist for more information. Do not breast-feed an infant while taking this medicine. This medicine has caused ovarian failure in some women and reduced sperm counts in some men This medicine may interfere with the ability to have a child. Talk with your doctor or health care professional if you are concerned about your fertility. This medicine may cause a decrease in Co-Enzyme Q-10. You should make sure that you get enough Co-Enzyme Q-10 while you are taking this medicine. Discuss the foods you eat and the vitamins you take with your health care professional. What side effects may I notice from receiving this medicine? Side effects that you should report to your doctor or health care professional as soon as possible:  allergic reactions like skin rash, itching or hives, swelling of the face, lips, or tongue  breathing problems  chest pain  fast or irregular heartbeat  low blood counts - this medicine may decrease the number of white blood cells, red blood cells and platelets. You may be at increased risk for infections and bleeding.  pain, redness, or irritation at site where injected  signs of infection - fever or chills, cough, sore throat, pain or difficulty passing urine  signs of decreased platelets or bleeding - bruising, pinpoint red spots on the skin, black, tarry stools, blood in the urine  swelling of the ankles, feet, hands  tiredness  weakness Side effects that usually do not require medical attention (report to your doctor or health care professional if they continue or are bothersome):  diarrhea  hair loss  mouth sores  nail discoloration or damage  nausea  red colored urine  vomiting This list may not describe all possible side effects. Call your doctor for medical advice about side effects. You may report  side effects to FDA at 1-800-FDA-1088. Where should I keep my medicine? This drug is given in a hospital or clinic and will not be stored at home. NOTE: This sheet is a summary. It may not cover all possible information. If you have questions about this medicine, talk to your doctor, pharmacist, or health care provider.  2020 [employer] (2017-07-05 11:01:26)  

## 2020-04-07 NOTE — Progress Notes (Signed)
First time Doxorubicin. 48min into tx pt reacted with symptoms of SOB, back pain, increase anxiety and diaphoresis. Additional meds given: O2 2L, Pepcid, benadryl 25mg , Morphine 2mg , and ativan 0.5mg . Tx restarted 79minutes after additional medications. New complaint of abdominal pain. Atropine 0.4mg  given.

## 2020-04-08 ENCOUNTER — Other Ambulatory Visit: Payer: Self-pay | Admitting: Oncology

## 2020-04-08 ENCOUNTER — Telehealth: Payer: Self-pay | Admitting: Adult Health

## 2020-04-08 LAB — CANCER ANTIGEN 27.29: CA 27.29: 441.7 U/mL — ABNORMAL HIGH (ref 0.0–38.6)

## 2020-04-08 NOTE — Progress Notes (Signed)
    DATE:  04/07/2020                                          X  CHEMO/IMMUNOTHERAPY REACTION            MD:  Dr. Sarajane Jews Magrinat   AGENT/BLOOD PRODUCT RECEIVING TODAY:              Doxil   AGENT/BLOOD PRODUCT RECEIVING IMMEDIATELY PRIOR TO REACTION:          Doxil   VS: BP:      155/107   P:        99       SPO2:        100% on 2 L of O2 via nasal cannula                  REACTION(S):            Back pain, shortness of breath, and anxiety   PREMEDS:      Dexamethasone 20 mg IV x1   INTERVENTION: Doxil was paused and the patient was given Benadryl 25 mg IV x1, Pepcid 20 mg IV x1, morphine sulfate 2 mg IV x1 and Ativan 0.5 mg IV x1   Review of Systems  Review of Systems  Constitutional: Negative for chills, diaphoresis and fever.  HENT: Negative for trouble swallowing and voice change.   Respiratory: Positive for shortness of breath. Negative for cough, chest tightness and wheezing.   Cardiovascular: Negative for chest pain and palpitations.  Gastrointestinal: Negative for abdominal pain, constipation, diarrhea, nausea and vomiting.  Musculoskeletal: Positive for back pain. Negative for myalgias.  Neurological: Negative for dizziness, light-headedness and headaches.  Psychiatric/Behavioral: The patient is nervous/anxious.      Physical Exam  Physical Exam Constitutional:      General: She is not in acute distress.    Appearance: She is not diaphoretic.  HENT:     Head: Normocephalic and atraumatic.  Cardiovascular:     Rate and Rhythm: Normal rate and regular rhythm.     Heart sounds: Normal heart sounds. No murmur. No friction rub. No gallop.   Pulmonary:     Effort: Pulmonary effort is normal. No respiratory distress.     Breath sounds: Normal breath sounds. No wheezing or rales.  Skin:    General: Skin is warm and dry.     Findings: No erythema or rash.  Neurological:     Mental Status: She is alert.  Psychiatric:     Comments: The patient appears to be anxious       OUTCOME:                 The patient's symptoms resolved and her Doxil was restarted and completed without further symptoms.   Sandi Mealy, MHS, PA-C

## 2020-04-08 NOTE — Telephone Encounter (Signed)
Scheduled appt per 5/4 los. Pt did not answer and I was not able to leave a voicemail because pt's voicemail box was full. Sent msg to HIM to mail letter.

## 2020-04-14 ENCOUNTER — Telehealth: Payer: Self-pay | Admitting: Adult Health

## 2020-04-14 ENCOUNTER — Other Ambulatory Visit: Payer: Self-pay

## 2020-04-14 ENCOUNTER — Observation Stay (HOSPITAL_COMMUNITY)
Admission: RE | Admit: 2020-04-14 | Discharge: 2020-04-14 | Disposition: A | Discharge status: Home / Self Care | Payer: Medicaid Other | Source: Ambulatory Visit | Attending: Adult Health | Admitting: Adult Health

## 2020-04-14 DIAGNOSIS — C50919 Malignant neoplasm of unspecified site of unspecified female breast: Secondary | ICD-10-CM | POA: Insufficient documentation

## 2020-04-14 DIAGNOSIS — K8020 Calculus of gallbladder without cholecystitis without obstruction: Secondary | ICD-10-CM | POA: Diagnosis not present

## 2020-04-14 DIAGNOSIS — C787 Secondary malignant neoplasm of liver and intrahepatic bile duct: Secondary | ICD-10-CM

## 2020-04-14 MED ORDER — GADOBUTROL 1 MMOL/ML IV SOLN
10.0000 mL | Freq: Once | INTRAVENOUS | Status: AC | PRN
  Administered 2020-04-14: 10 mL via INTRAVENOUS

## 2020-04-14 NOTE — Telephone Encounter (Signed)
Attempted to call patient twice with MRI results.  Unable to get through.    Wilber Bihari, NP

## 2020-04-20 NOTE — Progress Notes (Signed)
[place]  [phone]) [phone] [phone]) [phone]   ID: Rebecca [name] DOB: [birth year]  MR#: [id number]  FFM#:[id number]  Patient Care Team: Rebecca Dew, FNP as PCP - General (Family Medicine) Rebecca Overall, MD as Consulting Physician (General Surgery) Rebecca [name], Rebecca Dad, MD as Consulting Physician (Oncology) Rebecca Pray, MD as Consulting Physician (Radiation Oncology) Rebecca [name], Rebecca Massed, NP as Nurse Practitioner (Hematology and Oncology) Rebecca Berthold, DO as Consulting Physician (Neurology) OTHER MD:  CHIEF COMPLAINT: triple negative stage IV breast cancer  CURRENT TREATMENT: Doxil q. 21 days   INTERVAL HISTORY: Rebecca [name] returns today for follow-up and treatment of her triple negative breast cancer.    Due to disease progression, she was changed to Doxil, given on day 1 of a 28 day cycle, at her last visit on 04/07/2020.  Today is day 15 cycle 1.  Her most recent echocardiogram on 04/02/2020 showed a stable EF of 50-55%.  It was completed with different echo equipment that her prior echo and therefore repeat in 3 months was recommended.    Since her last visit, she underwent abdomen MRI on 04/14/2020 showing: no evidence liver metastasis; cholelithiasis without evidence of cholecystitis; no abdominal lymphadenopathy.   REVIEW OF SYSTEMS: Rebecca [name] feels tired.  She is discouraged.  She feels the cancer in her chest is "eating up her chest".  There is a little bit of a smell.  Despite all this she continues to work.  She has had no fever, unusual headache, change in bowel or bladder habits.  She requested a refill in her lorazepam.  She says she is not taking pain medicine regularly but would like to have some extra once available.  Review of PMP aware shows no one else is writing either medication for her and she is using a single pharmacy.  Detailed review of systems was otherwise stable.    BREAST CANCER HISTORY: From the original intake note:  Rebecca [name]  herself noted a change in her right breast sometime around September or October 2016. She did not bring it to intermediate medical attention, but on 01/13/2016 she established herself in Dr. Smith Robert' service and she was set up for bilateral diagnostic mammography with tomosynthesis and bilateral ultrasonography at the [facility] 01/19/2016. The breast density was category B. In the upper outer quadrant of the right breast there was a spiculated mass measuring 2.8 cm. On physical exam this was palpable. Targeted ultrasonography confirmed an irregular hypoechoic mass in the right breast 11:30 o'clock position measuring 2.6 cm maximally. Ultrasound of the right axilla showed a morphologically abnormal lymph node.  In the left breast there were some tubular densities behind the areola which by ultrasonography showed benign ductal ectasia.  On 01/28/2016 Rebecca [name] underwent biopsy of the right breast mass and abnormal right axillary lymph node. The pathology from this procedure (S AAA [id number]) showed the lymph node to be benign. In the breast however there was an invasive ductal carcinoma, grade 3, which was estrogen and progesterone receptor negative. The proliferation marker was 70%. HER-2 was not amplified with a signals ratio of 1.32. The number per cell was 2.05.  The patient's subsequent history is as detailed below   PAST MEDICAL HISTORY: Past Medical History:  Diagnosis Date  . Anxiety   . Breast cancer ([place])   . Depression   . GERD (gastroesophageal reflux disease)   . History of radiation therapy 11/15/16-01/12/17   right chest wall and axilla treated to 45 Gy in 25 fractions, boosted and  additional 14 Gy in 8 fractions  . Hypertension    diet controlled  . Obesity (BMI 35.0-39.9 without comorbidity)   . Pneumonia    as a child  . Seasonal allergies   . Sickle cell trait ([place])   . Termination of pregnancy (fetus) 04/02/16    PAST SURGICAL HISTORY: Past Surgical History:  Procedure  Laterality Date  . CESAREAN SECTION     2004 and 2007  . MASTECTOMY W/ SENTINEL NODE BIOPSY Right 09/06/2016   Procedure: RIGHT BREAST MASTECTOMY WITH RIGHT AXILLARY SENTINEL LYMPH NODE BIOPSY;  Surgeon: Rebecca Overall, MD;  Location: [facility];  Service: General;  Laterality: Right;  . PORT-A-CATH REMOVAL Left 09/06/2016   Procedure: REMOVAL PORT-A-CATH;  Surgeon: Rebecca Overall, MD;  Location: [facility];  Service: General;  Laterality: Left;  . PORTACATH PLACEMENT    . PORTACATH PLACEMENT N/A 07/09/2019   Procedure: INSERTION PORT-A-CATH WITH ULTRASOUND;  Surgeon: Rebecca Overall, MD;  Location: [facility];  Service: General;  Laterality: N/A;    FAMILY HISTORY Family History  Problem Relation Age of Onset  . Hypertension Mother   . Cancer Mother        dx "intestinal cancer" in her 5s; +surgery  . Other Mother        hysterectomy at young age for unspecified cause  . Heart Problems Mother   . Breast cancer Cousin        maternal 1st cousin dx female breast cancer at 2-46y  . Cancer Father   . Hypertension Father   . Heart Problems Maternal Aunt   . Diabetes Maternal Aunt   . Breast cancer Maternal Uncle        dx 64-65  . Heart Problems Maternal Uncle   . Breast cancer Maternal Grandmother 33  . Throat cancer Maternal Grandfather        d. 83s; smoker  . Sickle cell anemia Paternal Aunt   . Congestive Heart Failure Maternal Aunt   . Multiple sclerosis Cousin   . Cancer Other        maternal great uncle (MGM's brother); cancer removed from his side  . Heart attack Paternal Aunt        d. early 19s  The patient has very little information about her father. Her mother is currently 54 years old. She had a history of cervical cancer at age 55. The patient had 2 brothers, no sisters. The maternal grandfather had throat cancer. A maternal uncle was diagnosed with breast cancer as well as prostate cancer at the age of 46. 2 maternal cousins, one of them female, had  breast cancer as well.   GYNECOLOGIC HISTORY:  No LMP recorded. Menarche age 55, first live birth age 34. The patient is GX P4. She was still having regular periods at the time of diagnosis. She took oral contraceptives in the 1990s with no side effects.--.  Stopped with chemotherapy and have not resumed as of May 2019   SOCIAL HISTORY:  (Updated 10/2018) She works as a Market researcher. The patient's significant other [name] works at [employer].  At home with the patient are her 3 children [name], Clarington and McKenzie. There are age 49, 10, and 77 as of November 2019.  2 of them are disabled or have significant health problems, one with sickle cell disease, the other with autism and developmental delay.  The patient's son [name], currently 4 years old, lives in [name].  ADVANCED DIRECTIVES: Not in place   HEALTH MAINTENANCE: Social History   Tobacco Use  . Smoking status: Former Smoker    Packs/day: 1.00    Years: 20.00    Pack years: 20.00    Types: Cigarettes    Quit date: 04/01/2018    Years since quitting: 2.0  . Smokeless tobacco: Never Used  . Tobacco comment: Patient has quit smoking x 1 year now  Substance Use Topics  . Alcohol use: Yes    Comment: occ  . Drug use: No    Colonoscopy:  PAP:  Bone density:  Lipid panel:  No Known Allergies  Current Outpatient Medications on File Prior to Visit  Medication Sig Dispense Refill  . calcium carbonate (TUMS - DOSED IN MG ELEMENTAL CALCIUM) 500 MG chewable tablet Chew 1 tablet by mouth daily as needed for indigestion or heartburn.    . lidocaine-prilocaine (EMLA) cream Apply to affected area once 30 g 3  . prochlorperazine (COMPAZINE) 10 MG tablet Take 1 tablet (10 mg total) by mouth every 6 (six) hours as needed (Nausea or vomiting). 30 tablet 1  . valACYclovir (VALTREX) 1000 MG tablet Take 1 tablet (1,000 mg total) by mouth daily. 90 tablet 1   No current  facility-administered medications on file prior to visit.    OBJECTIVE: African-American woman   Vitals:   04/21/20 1218  BP: 113/72  Pulse: (!) 103  Resp: 18  Temp: 98.6 F (37 C)  SpO2: 100%   Wt Readings from Last 3 Encounters:  04/21/20 203 lb 14.4 oz (92.5 kg)  04/07/20 207 lb 3.2 oz (94 kg)  03/02/20 202 lb (91.6 kg)   Body mass index is 37.9 kg/m.    ECOG FS:1 - Symptomatic but completely ambulatory  Sclerae unicteric, EOMs intact Wearing a mask No cervical or supraclavicular adenopathy Lungs no rales or rhonchi Heart regular rate and rhythm Abd soft, nontender, positive bowel sounds MSK no focal spinal tenderness, no upper extremity lymphedema Neuro: nonfocal, well oriented, appropriate affect Breasts: Status post right mastectomy.  The right chest wall is imaged below.  The left breast is unremarkable.  Right breast 04/21/2020      Right chest wall 02/11/2020.     Right chest wall 12/26/2019:     Right chest wall area 11/22/2019, which is the new baseline   LAB RESULTS: CMP Latest Ref Rng & Units 04/21/2020 04/07/2020 03/02/2020  Glucose 70 - 99 mg/dL 105(H) 116(H) 113(H)  BUN 6 - 20 mg/dL _0 Creatinine 0.44 - 1.00 mg/dL 0.86 0.99 0.92  Sodium 135 - 145 mmol/L 144 143 142  Potassium 3.5 - 5.1 mmol/L 3.8 3.7 3.5  Chloride 98 - 111 mmol/L 104 107 106  CO2 22 - 32 mmol/L _1 Calcium 8.9 - 10.3 mg/dL 9.6 9.4 9.7  Total Protein 6.5 - 8.1 g/dL 7.8 7.7 8.2(H)  Total Bilirubin 0.3 - 1.2 mg/dL 0.3 0.3 0.3  Alkaline Phos 38 - 126 U/L 77 74 72  AST 15 - 41 U/L _2 ALT 0 - 44 U/L _3 CBC    Component Value Date/Time   WBC 3.7 (L) 04/21/2020 1200   RBC 2.93 (L) 04/21/2020 1200   HGB 9.7 (L) 04/21/2020 1200   HGB 6.3 (LL) 11/08/2019 1350   HGB 10.4 (L) 10/04/2016 1154   HCT 28.4 (L) 04/21/2020 1200   HCT 31.3 (L) 10/04/2016 1154   PLT 47 (L) 04/21/2020 1200  PLT 10 (L) 11/08/2019 1350   PLT 320 10/04/2016 1154   MCV  96.9 04/21/2020 1200   MCV 95.7 10/04/2016 1154   MCH 33.1 04/21/2020 1200   MCHC 34.2 04/21/2020 1200   RDW 15.2 04/21/2020 1200   RDW 16.3 (H) 10/04/2016 1154   LYMPHSABS 1.1 04/21/2020 1200   LYMPHSABS 1.7 10/04/2016 1154   MONOABS 0.1 04/21/2020 1200   MONOABS 0.5 10/04/2016 1154   EOSABS 0.0 04/21/2020 1200   EOSABS 0.3 10/04/2016 1154   BASOSABS 0.0 04/21/2020 1200   BASOSABS 0.0 10/04/2016 1154    STUDIES: CT Chest W Contrast  Result Date: 04/02/2020 CLINICAL DATA:  Breast cancer diagnosed in 2016 with recurrence in 2020. Right mastectomy. Radiation therapy complete. Chemotherapy in progress. EXAM: CT CHEST WITH CONTRAST TECHNIQUE: Multidetector CT imaging of the chest was performed during intravenous contrast administration. CONTRAST:  95m OMNIPAQUE IOHEXOL 300 MG/ML  SOLN COMPARISON:  11/13/2019 FINDINGS: Cardiovascular: Left-sided Port-A-Cath terminates at the high right atrium. Normal aortic caliber. Normal heart size, without pericardial effusion. No central pulmonary embolism, on this non-dedicated study. Mediastinum/Nodes: New right axillary adenopathy. 1.1 cm on 39/2 versus 5 mm on the prior. Index left axillary nodal mass measures 2.3 x 4.4 cm on 58/2 versus 1.4 x 2.9 cm on the prior exam (when remeasured). Left subpectoral node measures 1.4 cm on 65/2 versus 9 mm on the prior. No mediastinal or hilar adenopathy. Right internal mammary soft tissue fullness, likely related to adenopathy. 8 mm on 45/2 versus 4 mm on the prior exam. Prevascular increased density including on 52/2, likely due to residual thymic tissue. Lungs/Pleura: No pleural fluid.  Clear lungs. Upper Abdomen: Diffusely heterogeneous density including within the hepatic dome is subtle and nonspecific. Example 95/2. Normal imaged portions of the spleen, stomach, pancreas, gallbladder, adrenal glands, kidneys. Musculoskeletal: Right mastectomy. New left breast lesions, with incomplete imaging of the left breast.  Example laterally at 1.6 cm on 61/2. Right chest wall progression. Soft tissue mass which is contiguous with the pectoralis musculature measures on the order of 3.8 x 3.1 cm today versus 3.7 x 2.2 cm on the prior exam (when remeasured). New or progressive chest wall skin thickening and irregularity, including at 1.3 cm on 60/2. IMPRESSION: 1. Moderate disease progression, as evidenced by increased left and new right axillary adenopathy. New left breast masses and progressive right chest wall disease. 2. Right internal mammary soft tissue fullness, likely due to progressive adenopathy. 3. Heterogeneous hepatic dome density is subtle and could simply be related to steatosis and the phase of bolus timing. This could either be re-evaluated at follow-up or more entirely characterized with dedicated pre and post contrast abdominal MRI. Electronically Signed   By: KAbigail MiyamotoM.D.   On: 04/02/2020 15:12   MR Abdomen W Wo Contrast  Result Date: 04/14/2020 CLINICAL DATA:  Breast cancer.  Concern for liver metastasis on CT. EXAM: MRI ABDOMEN WITHOUT AND WITH CONTRAST TECHNIQUE: Multiplanar multisequence MR imaging of the abdomen was performed both before and after the administration of intravenous contrast. CONTRAST:  128mGADAVIST GADOBUTROL 1 MMOL/ML IV SOLN COMPARISON:  CT 04/03/2019 FINDINGS: Lower chest:  Lung bases are clear. Hepatobiliary: No discrete lesion in the dome of the liver to correspond to the ill-defined region heterogeneous density described on comparison CT. No significant hepatic steatosis present. Postcontrast enhanced imaging demonstrates no enhancement through this region the dome of the liver. No additional enhancing hepatic lesion identified. Enhancement of the the transverse colon superimposed on the inferior  RIGHT hepatic lobe is favored benign. (Image 45/15) There are multiple gallstones completely filling the lumen of the gallbladder (image 21/series 22). The stones measure approximately 4  mm each in are too numerous to count. No gallbladder inflammation. Common bile duct normal caliber. No intrahepatic duct dilatation Pancreas: Normal pancreatic parenchymal intensity. No ductal dilatation or inflammation. Spleen: Normal spleen. Adrenals/urinary tract: Adrenal glands and kidneys are normal. Stomach/Bowel: Stomach and limited of the small bowel is unremarkable Vascular/Lymphatic: Abdominal aortic normal caliber. No retroperitoneal periportal lymphadenopathy. Musculoskeletal: No aggressive osseous lesion IMPRESSION: 1. No evidence liver metastasis. 2. Cholelithiasis without evidence of cholecystitis. Too numerous to count small gallstones present. No choledocholithiasis 3. No abdominal lymphadenopathy. Electronically Signed   By: Suzy Bouchard M.D.   On: 04/14/2020 13:02   ECHOCARDIOGRAM COMPLETE  Result Date: 04/03/2020    ECHOCARDIOGRAM REPORT   Patient Name:   JAIYAH BEINING [name] Date of Exam: 04/02/2020 Medical Rec #:  [id number]                  Height:       61.5 in Accession #:    [id number]                 Weight:       202.0 lb Date of Birth:  [birth year]                   BSA:          1.908 m Patient Age:    25 years                   BP:           110/75 mmHg Patient Gender: F                          HR:           80 bpm. Exam Location:  Outpatient Procedure: 2D Echo Indications:    chemotherapy evaluation  History:        Patient has prior history of Echocardiogram examinations, most                 recent 08/03/2016. Risk Factors:Hypertension. Breast cancer.  Sonographer:    Jannett Celestine RDCS (AE) Referring Phys: [id number] Chauncey Cruel  Sonographer Comments: Image acquisition challenging due to patient body habitus. IMPRESSIONS  1. Since the prior study on 08/03/2016 LVEF has slightly decreased from 55-60% to 50-55%. GLS was performed on different softwares and is not comparable. Consider repeating study in 3 months.  2. Left ventricular ejection fraction, by estimation, is 50 to  55%. The left ventricle has low normal function. The left ventricle has no regional wall motion abnormalities. Left ventricular diastolic parameters were normal. The average left ventricular  global longitudinal strain is -11.5 %.  3. Right ventricular systolic function is normal. The right ventricular size is normal.  4. The mitral valve is normal in structure. Trivial mitral valve regurgitation. No evidence of mitral stenosis.  5. The aortic valve is normal in structure. Aortic valve regurgitation is not visualized. No aortic stenosis is present.  6. The inferior vena cava is normal in size with greater than 50% respiratory variability, suggesting right atrial pressure of 3 mmHg. FINDINGS  Left Ventricle: Left ventricular ejection fraction, by estimation, is 50 to 55%. The left ventricle has low normal function. The left ventricle has no regional wall motion abnormalities. The average left ventricular  global longitudinal strain is -11.5 %. The left ventricular internal cavity size was normal in size. There is no left ventricular hypertrophy. Left ventricular diastolic parameters were normal. Right Ventricle: The right ventricular size is normal. No increase in right ventricular wall thickness. Right ventricular systolic function is normal. Left Atrium: Left atrial size was normal in size. Right Atrium: Right atrial size was normal in size. Pericardium: There is no evidence of pericardial effusion. Mitral Valve: The mitral valve is normal in structure. Normal mobility of the mitral valve leaflets. Trivial mitral valve regurgitation. No evidence of mitral valve stenosis. Tricuspid Valve: The tricuspid valve is normal in structure. Tricuspid valve regurgitation is not demonstrated. No evidence of tricuspid stenosis. Aortic Valve: The aortic valve is normal in structure. Aortic valve regurgitation is not visualized. No aortic stenosis is present. Pulmonic Valve: The pulmonic valve was normal in structure. Pulmonic  valve regurgitation is not visualized. No evidence of pulmonic stenosis. Aorta: The aortic root is normal in size and structure. Venous: The inferior vena cava is normal in size with greater than 50% respiratory variability, suggesting right atrial pressure of 3 mmHg. IAS/Shunts: No atrial level shunt detected by color flow Doppler.  LEFT VENTRICLE PLAX 2D LVIDd:         4.90 cm  Diastology LVIDs:         3.10 cm  LV e' lateral:   9.57 cm/s LV PW:         0.70 cm  LV E/e' lateral: 11.4 LV IVS:        0.90 cm  LV e' medial:    7.51 cm/s LVOT diam:     2.00 cm  LV E/e' medial:  14.5 LV SV:         49 LV SV Index:   26       2D Longitudinal Strain LVOT Area:     3.14 cm 2D Strain GLS Avg:     -11.5 %  RIGHT VENTRICLE RV S prime:     12.20 cm/s TAPSE (M-mode): 1.4 cm LEFT ATRIUM             Index       RIGHT ATRIUM           Index LA diam:        3.10 cm 1.62 cm/m  RA Area:     11.10 cm LA Vol (A2C):   35.5 ml 18.60 ml/m RA Volume:   24.20 ml  12.68 ml/m LA Vol (A4C):   24.5 ml 12.84 ml/m LA Biplane Vol: 29.7 ml 15.56 ml/m  AORTIC VALVE LVOT Vmax:   85.00 cm/s LVOT Vmean:  64.100 cm/s LVOT VTI:    0.157 m  AORTA Ao Root diam: 2.80 cm MITRAL VALVE MV Area (PHT): 3.54 cm     SHUNTS MV Decel Time: 214 msec     Systemic VTI:  0.16 m MV E velocity: 109.00 cm/s  Systemic Diam: 2.00 cm MV A velocity: 73.70 cm/s MV E/A ratio:  1.48 Ena Dawley MD Electronically signed by Ena Dawley MD Signature Date/Time: 04/03/2020/12:53:52 PM    Final      RESEARCH: Referred to PREVENT study, but was pregnant at the time; referred to [facility], but the biopsied lymph node was benign; referred to weight loss study but declined; referred to [facility] 12/09/2000, but the timing of radiation and 8 was greater than 60 days past the date of diagnosis; referred to health disparity study, but declined; referred to [facility]  adjuvant therapy study, but the patient received Xeloda with radiation and therefore was  ineligible   ASSESSMENT: 44 y.o. [place] woman status post right breast upper-outer quadrant biopsy 01/28/2016 for a clinical T2 N0 invasive ductal carcinoma, grade 3, triple negative, with an MIB-1 of 70%.  (a) suspicious right axillary lymph node biopsied 01/28/2016 was benign  (1) neoadjuvant chemotherapy: doxorubicin and cyclophosphamide in dose dense fashion 4 started 04/14/16, completed 05/26/2016, followed by paclitaxel and carboplatin weekly 12, Started 06/09/2016  (a) taxol discontinued after 7 doses because of neuropathy, last dose 07/21/2016  (2) genetics testing October 20, 2016 through the 32-gene Comprehensive Cancer Panel offered by [facility] Junius Roads, MD) (with MSH2 Exons 1-7 Inversion Analysis) found no deleterious mutations or VUSS  In APC, ATM, AXIN2, BARD1, BMPR1A, BRCA1, BRCA2, BRIP1, CDH1, CDK4, CDKN2A, CHEK2, EPCAM, FANCC, MLH1, MSH2, MSH6, MUTYH, NBN, PALB2, PMS2, POLD1, POLE, PTEN, RAD51C, RAD51D, SCG5/GREM1, SMAD4, STK11, TP53, VHL, and XRCC2.    (3) right mastectomy and sentinel lymph node sampling 09/06/2016 showed a residual ypT1c ypN0, invasive ductal carcinoma, grade 3, with negative margins. Repeat prognostic panel again triple negative   (4) adjuvant radiation with capecitabine/Xeloda sensitization 11/15/16 - 01/12/17 Site/dose:   Right Chest Wall and axilla (4 field) treated to 45 Gy in 25 fractions, and then Boosted an additional 14.4 Gy in 8 fractions.  (5) tobacco abuse disorder: The patient quit smoking 04/04/2018  METASTATIC DISEASE:  July 2020: chest wall, bones, nodes (6) nonspecific changes noted on chest CT 06/11/2019 were clarified by PET scan 06/19/2019 showing hypermetabolic disease in the right anterior chest wall, right internal mammary nodes, right and left axillary nodes, but no metastatic disease in the neck, lungs, abdomen or pelvis.  Bone marrow uptake suggests bony metastatic disease.  (a) CARIS requested obtained from  06/28/2019 sample confirmed a triple negativity, the tumor also was negative for the androgen receptor, was MSI stable and mismatch repair status proficient, with a low mutational burden.  BRCA 1 and 2 were negative and PD-L1 was negative.  PI K3 showed a variant of uncertain significance.  However the tumor was genomic LOH high  (b) CA-27-29 is informative: was 118.3 on 06/04/2019  (7) zoledronate started 07/17/2019--on hold currently due to dental issues, and until 6 weeks at least after dental work  (8) carboplatin/ gemcitabine days 1 and 8 Q21 day cycle started 07/10/2019, last dose 10/30/2019  (a) restaging studies 09/2019 showed no progression  (b) restaging studies after 6 cycles showed mild disease progression  (9) cyclophosphamide, methotrexate, fluorouracil chemotherapy started 11/13/2019, repeated every 21 days.  (a) discontinued after cycle 3 (12/31/2019): No evidence of response  (10) started capecitabine 01/21/2020, given 1 week on and 1 week off at standard doses (1500 mg twice daily)   (a) Discontinued after almost three months of treatment (last on 04/06/2020)  (b) Disease progression on 4/27 CT scan showing increasing right chest wall disease, left breast lesions, and ? Liver involvement.  (c) MRI liver pending.  (11) Started liposomal doxorubicin/Doxil given on day 1 of a 28 day cycle   (a) echo on 04/02/2020 shows EF of 50-55%, recommends repeat in 06/2020   PLAN: Kathy tolerated the first dose of liposomal doxorubicin well and I think we may be able to give this to her every 3 weeks.  Possibly the more intense treatment will get [place] a better result.  After 3 cycles if there has not been significant response we will switch to sacituzumab govitecan.  I think she may  do better with wet-to-dry dressings.  We showed her how to do that today and gave her the appropriate materials.  I am also starting her on doxycycline daily to cut back on this slight smell from the wound.  I was  delighted that we did not see involvement of the liver and I gave her a copy of that MRI.  We had placed a referral to [facility] for second opinion but I do not see an appointment date and she has not heard from them yet.  I think that will be more relevant to her important if we do not get a response from either Doxil or Trodelvy  Total encounter time today 35 minutes.Sarajane Jews C. Altin Sease, MD 04/21/20 1:08 PM Medical Oncology and Hematology [facility] [place], [place] [address] Tel. [phone]    Fax. [phone]   I, Wilburn Mylar, am acting as scribe for Dr. Virgie [name]. Idalys Konecny.  I, Lurline Del MD, have reviewed the above documentation for accuracy and completeness, and I agree with the above.    *Total Encounter Time as defined by the Centers for Medicare and Medicaid Services includes, in addition to the face-to-face time of a patient visit (documented in the note above) non-face-to-face time: obtaining and reviewing outside history, ordering and reviewing medications, tests or procedures, care coordination (communications with other health care professionals or caregivers) and documentation in the medical record.

## 2020-04-21 ENCOUNTER — Other Ambulatory Visit: Payer: Self-pay

## 2020-04-21 ENCOUNTER — Inpatient Hospital Stay: Payer: Medicaid Other

## 2020-04-21 ENCOUNTER — Inpatient Hospital Stay (HOSPITAL_BASED_OUTPATIENT_CLINIC_OR_DEPARTMENT_OTHER): Payer: Medicaid Other | Admitting: Oncology

## 2020-04-21 VITALS — BP 113/72 | HR 103 | Temp 98.6°F | Resp 18 | Ht 61.5 in | Wt 203.9 lb

## 2020-04-21 DIAGNOSIS — Z5111 Encounter for antineoplastic chemotherapy: Secondary | ICD-10-CM | POA: Diagnosis not present

## 2020-04-21 DIAGNOSIS — Z171 Estrogen receptor negative status [ER-]: Secondary | ICD-10-CM

## 2020-04-21 DIAGNOSIS — C7951 Secondary malignant neoplasm of bone: Secondary | ICD-10-CM

## 2020-04-21 DIAGNOSIS — C50411 Malignant neoplasm of upper-outer quadrant of right female breast: Secondary | ICD-10-CM | POA: Diagnosis not present

## 2020-04-21 LAB — CBC WITH DIFFERENTIAL/PLATELET
Abs Immature Granulocytes: 0.02 10*3/uL (ref 0.00–0.07)
Basophils Absolute: 0 10*3/uL (ref 0.0–0.1)
Basophils Relative: 1 %
Eosinophils Absolute: 0 10*3/uL (ref 0.0–0.5)
Eosinophils Relative: 1 %
HCT: 28.4 % — ABNORMAL LOW (ref 36.0–46.0)
Hemoglobin: 9.7 g/dL — ABNORMAL LOW (ref 12.0–15.0)
Immature Granulocytes: 1 %
Lymphocytes Relative: 30 %
Lymphs Abs: 1.1 10*3/uL (ref 0.7–4.0)
MCH: 33.1 pg (ref 26.0–34.0)
MCHC: 34.2 g/dL (ref 30.0–36.0)
MCV: 96.9 fL (ref 80.0–100.0)
Monocytes Absolute: 0.1 10*3/uL (ref 0.1–1.0)
Monocytes Relative: 4 %
Neutro Abs: 2.3 10*3/uL (ref 1.7–7.7)
Neutrophils Relative %: 63 %
Platelets: 47 10*3/uL — ABNORMAL LOW (ref 150–400)
RBC: 2.93 MIL/uL — ABNORMAL LOW (ref 3.87–5.11)
RDW: 15.2 % (ref 11.5–15.5)
WBC: 3.7 10*3/uL — ABNORMAL LOW (ref 4.0–10.5)
nRBC: 0 % (ref 0.0–0.2)

## 2020-04-21 LAB — SAMPLE TO BLOOD BANK

## 2020-04-21 LAB — COMPREHENSIVE METABOLIC PANEL
ALT: 10 U/L (ref 0–44)
AST: 15 U/L (ref 15–41)
Albumin: 3.7 g/dL (ref 3.5–5.0)
Alkaline Phosphatase: 77 U/L (ref 38–126)
Anion gap: 11 (ref 5–15)
BUN: 9 mg/dL (ref 6–20)
CO2: 29 mmol/L (ref 22–32)
Calcium: 9.6 mg/dL (ref 8.9–10.3)
Chloride: 104 mmol/L (ref 98–111)
Creatinine, Ser: 0.86 mg/dL (ref 0.44–1.00)
GFR calc Af Amer: >60 mL/min (ref >60)
GFR calc non Af Amer: >60 mL/min (ref >60)
Glucose, Bld: 105 mg/dL — ABNORMAL HIGH (ref 70–99)
Potassium: 3.8 mmol/L (ref 3.5–5.1)
Sodium: 144 mmol/L (ref 135–145)
Total Bilirubin: 0.3 mg/dL (ref 0.3–1.2)
Total Protein: 7.8 g/dL (ref 6.5–8.1)

## 2020-04-21 MED ORDER — DOXYCYCLINE HYCLATE 100 MG PO TABS
100.0000 mg | ORAL_TABLET | Freq: Every day | ORAL | 4 refills | Status: DC
Start: 2020-04-21 — End: 2020-08-14

## 2020-04-21 MED ORDER — TRAMADOL HCL 50 MG PO TABS: 50.0000 mg | ORAL_TABLET | Freq: Four times a day (QID) | ORAL | 0 refills | Status: DC | PRN

## 2020-04-21 MED ORDER — LORAZEPAM 0.5 MG PO TABS: 0.5000 mg | ORAL_TABLET | Freq: Every day | ORAL | 0 refills | Status: DC | PRN

## 2020-04-22 ENCOUNTER — Telehealth: Payer: Self-pay | Admitting: Oncology

## 2020-04-22 NOTE — Telephone Encounter (Signed)
Cancelled and Scheduled appts per 5/18 los. Pt confirmed appt dates and times.

## 2020-04-27 NOTE — Progress Notes (Signed)
Per Dr. [name], add diphenhydramine and famotidine as premedications for infusion reaction with cycle 1. Please also run C2 at first dose rate of 1 mg/min (80 mg = 80 min).   Demetrius Charity, PharmD, BCPS, [facility] Phone: [phone] 04/27/2020

## 2020-04-28 ENCOUNTER — Inpatient Hospital Stay: Payer: Medicaid Other

## 2020-04-28 ENCOUNTER — Other Ambulatory Visit: Payer: Medicaid Other

## 2020-04-28 ENCOUNTER — Ambulatory Visit: Payer: Medicaid Other

## 2020-04-28 ENCOUNTER — Other Ambulatory Visit: Payer: Self-pay

## 2020-04-28 VITALS — BP 112/78 | HR 100 | Temp 98.0°F | Resp 17 | Wt 204.5 lb

## 2020-04-28 DIAGNOSIS — Z171 Estrogen receptor negative status [ER-]: Secondary | ICD-10-CM

## 2020-04-28 DIAGNOSIS — C50411 Malignant neoplasm of upper-outer quadrant of right female breast: Secondary | ICD-10-CM

## 2020-04-28 DIAGNOSIS — C7951 Secondary malignant neoplasm of bone: Secondary | ICD-10-CM

## 2020-04-28 DIAGNOSIS — C50919 Malignant neoplasm of unspecified site of unspecified female breast: Secondary | ICD-10-CM

## 2020-04-28 DIAGNOSIS — Z7189 Other specified counseling: Secondary | ICD-10-CM

## 2020-04-28 DIAGNOSIS — Z5111 Encounter for antineoplastic chemotherapy: Secondary | ICD-10-CM | POA: Diagnosis not present

## 2020-04-28 LAB — COMPREHENSIVE METABOLIC PANEL WITH GFR
ALT: 9 U/L (ref 0–44)
AST: 12 U/L — ABNORMAL LOW (ref 15–41)
Albumin: 3.6 g/dL (ref 3.5–5.0)
Alkaline Phosphatase: 69 U/L (ref 38–126)
Anion gap: 9 (ref 5–15)
BUN: 6 mg/dL (ref 6–20)
CO2: 26 mmol/L (ref 22–32)
Calcium: 9 mg/dL (ref 8.9–10.3)
Chloride: 108 mmol/L (ref 98–111)
Creatinine, Ser: 0.8 mg/dL (ref 0.44–1.00)
GFR calc Af Amer: >60 mL/min
GFR calc non Af Amer: >60 mL/min
Glucose, Bld: 131 mg/dL — ABNORMAL HIGH (ref 70–99)
Potassium: 3.2 mmol/L — ABNORMAL LOW (ref 3.5–5.1)
Sodium: 143 mmol/L (ref 135–145)
Total Bilirubin: 0.2 mg/dL — ABNORMAL LOW (ref 0.3–1.2)
Total Protein: 7.3 g/dL (ref 6.5–8.1)

## 2020-04-28 LAB — CBC WITH DIFFERENTIAL/PLATELET
Abs Immature Granulocytes: 0.02 K/uL (ref 0.00–0.07)
Basophils Absolute: 0 K/uL (ref 0.0–0.1)
Basophils Relative: 1 %
Eosinophils Absolute: 0 K/uL (ref 0.0–0.5)
Eosinophils Relative: 1 %
HCT: 28.2 % — ABNORMAL LOW (ref 36.0–46.0)
Hemoglobin: 9.5 g/dL — ABNORMAL LOW (ref 12.0–15.0)
Immature Granulocytes: 1 %
Lymphocytes Relative: 32 %
Lymphs Abs: 1.4 K/uL (ref 0.7–4.0)
MCH: 33.6 pg (ref 26.0–34.0)
MCHC: 33.7 g/dL (ref 30.0–36.0)
MCV: 99.6 fL (ref 80.0–100.0)
Monocytes Absolute: 0.8 K/uL (ref 0.1–1.0)
Monocytes Relative: 17 %
Neutro Abs: 2.2 K/uL (ref 1.7–7.7)
Neutrophils Relative %: 48 %
Platelets: 197 K/uL (ref 150–400)
RBC: 2.83 MIL/uL — ABNORMAL LOW (ref 3.87–5.11)
RDW: 15.5 % (ref 11.5–15.5)
WBC: 4.4 K/uL (ref 4.0–10.5)
nRBC: 0 % (ref 0.0–0.2)

## 2020-04-28 LAB — SAMPLE TO BLOOD BANK

## 2020-04-28 LAB — RETICULOCYTES
Immature Retic Fract: 27.5 % — ABNORMAL HIGH (ref 2.3–15.9)
RBC.: 2.8 MIL/uL — ABNORMAL LOW (ref 3.87–5.11)
Retic Count, Absolute: 46.5 K/uL (ref 19.0–186.0)
Retic Ct Pct: 1.7 % (ref 0.4–3.1)

## 2020-04-28 MED ORDER — SODIUM CHLORIDE 0.9% FLUSH
10.0000 mL | INTRAVENOUS | Status: DC | PRN
  Administered 2020-04-28: 10 mL
  Filled 2020-04-28: qty 10

## 2020-04-28 MED ORDER — FAMOTIDINE IN NACL 20-0.9 MG/50ML-% IV SOLN
20.0000 mg | Freq: Once | INTRAVENOUS | Status: AC
  Administered 2020-04-28: 20 mg via INTRAVENOUS

## 2020-04-28 MED ORDER — DIPHENHYDRAMINE HCL 25 MG PO CAPS
ORAL_CAPSULE | ORAL | Status: AC
  Filled 2020-04-28: qty 1

## 2020-04-28 MED ORDER — FAMOTIDINE IN NACL 20-0.9 MG/50ML-% IV SOLN
INTRAVENOUS | Status: AC
  Filled 2020-04-28: qty 50

## 2020-04-28 MED ORDER — SODIUM CHLORIDE 0.9 % IV SOLN
10.0000 mg | Freq: Once | INTRAVENOUS | Status: AC
  Administered 2020-04-28: 10 mg via INTRAVENOUS
  Filled 2020-04-28: qty 10

## 2020-04-28 MED ORDER — DOXORUBICIN HCL LIPOSOMAL CHEMO INJECTION 2 MG/ML
40.0000 mg/m2 | Freq: Once | INTRAVENOUS | Status: AC
  Administered 2020-04-28: 80 mg via INTRAVENOUS
  Filled 2020-04-28: qty 40

## 2020-04-28 MED ORDER — DIPHENHYDRAMINE HCL 25 MG PO CAPS
25.0000 mg | ORAL_CAPSULE | Freq: Once | ORAL | Status: AC
  Administered 2020-04-28: 25 mg via ORAL

## 2020-04-28 MED ORDER — DEXTROSE 5 % IV SOLN
Freq: Once | INTRAVENOUS | Status: AC
  Filled 2020-04-28: qty 250

## 2020-04-28 MED ORDER — HEPARIN SOD (PORK) LOCK FLUSH 100 UNIT/ML IV SOLN
500.0000 [IU] | Freq: Once | INTRAVENOUS | Status: AC | PRN
  Administered 2020-04-28: 500 [IU]
  Filled 2020-04-28: qty 5

## 2020-04-28 NOTE — Patient Instructions (Signed)
[facility] Discharge Instructions for Patients Receiving Chemotherapy  Today you received the following chemotherapy agents Doxorubicin (DOXIL).  To help prevent nausea and vomiting after your treatment, we encourage you to take your nausea medication as prescribed.  If you develop nausea and vomiting that is not controlled by your nausea medication, call the clinic.   BELOW ARE SYMPTOMS THAT SHOULD BE REPORTED IMMEDIATELY:  *FEVER GREATER THAN 100.5 F  *CHILLS WITH OR WITHOUT FEVER  NAUSEA AND VOMITING THAT IS NOT CONTROLLED WITH YOUR NAUSEA MEDICATION  *UNUSUAL SHORTNESS OF BREATH  *UNUSUAL BRUISING OR BLEEDING  TENDERNESS IN MOUTH AND THROAT WITH OR WITHOUT PRESENCE OF ULCERS  *URINARY PROBLEMS  *BOWEL PROBLEMS  UNUSUAL RASH Items with * indicate a potential emergency and should be followed up as soon as possible.  Feel free to call the clinic should you have any questions or concerns. The clinic phone number is [phone].  Please show the Micro at check-in to the Emergency Department and triage nurse.

## 2020-04-29 LAB — CANCER ANTIGEN 27.29: CA 27.29: 428.1 U/mL — ABNORMAL HIGH (ref 0.0–38.6)

## 2020-04-29 LAB — FERRITIN: Ferritin: 486 ng/mL — ABNORMAL HIGH (ref 11–307)

## 2020-05-06 ENCOUNTER — Other Ambulatory Visit: Payer: Self-pay

## 2020-05-06 ENCOUNTER — Encounter: Payer: Self-pay | Admitting: Adult Health

## 2020-05-06 ENCOUNTER — Ambulatory Visit: Payer: Medicaid Other

## 2020-05-06 ENCOUNTER — Inpatient Hospital Stay: Payer: Medicaid Other

## 2020-05-06 ENCOUNTER — Inpatient Hospital Stay: Payer: Medicaid Other | Attending: Oncology | Admitting: Adult Health

## 2020-05-06 VITALS — BP 110/76 | HR 100 | Temp 98.2°F | Resp 20 | Ht 61.5 in | Wt 202.8 lb

## 2020-05-06 DIAGNOSIS — Z87891 Personal history of nicotine dependence: Secondary | ICD-10-CM | POA: Diagnosis not present

## 2020-05-06 DIAGNOSIS — I10 Essential (primary) hypertension: Secondary | ICD-10-CM | POA: Insufficient documentation

## 2020-05-06 DIAGNOSIS — Z171 Estrogen receptor negative status [ER-]: Secondary | ICD-10-CM | POA: Insufficient documentation

## 2020-05-06 DIAGNOSIS — K219 Gastro-esophageal reflux disease without esophagitis: Secondary | ICD-10-CM | POA: Diagnosis not present

## 2020-05-06 DIAGNOSIS — C7951 Secondary malignant neoplasm of bone: Secondary | ICD-10-CM | POA: Diagnosis not present

## 2020-05-06 DIAGNOSIS — Z923 Personal history of irradiation: Secondary | ICD-10-CM | POA: Diagnosis not present

## 2020-05-06 DIAGNOSIS — C50411 Malignant neoplasm of upper-outer quadrant of right female breast: Secondary | ICD-10-CM | POA: Diagnosis not present

## 2020-05-06 DIAGNOSIS — F418 Other specified anxiety disorders: Secondary | ICD-10-CM | POA: Diagnosis not present

## 2020-05-06 DIAGNOSIS — Z79899 Other long term (current) drug therapy: Secondary | ICD-10-CM | POA: Insufficient documentation

## 2020-05-06 DIAGNOSIS — D573 Sickle-cell trait: Secondary | ICD-10-CM | POA: Insufficient documentation

## 2020-05-06 DIAGNOSIS — Z9221 Personal history of antineoplastic chemotherapy: Secondary | ICD-10-CM | POA: Insufficient documentation

## 2020-05-06 DIAGNOSIS — C787 Secondary malignant neoplasm of liver and intrahepatic bile duct: Secondary | ICD-10-CM | POA: Diagnosis not present

## 2020-05-06 LAB — CBC WITH DIFFERENTIAL/PLATELET
Abs Immature Granulocytes: 0.07 10*3/uL (ref 0.00–0.07)
Basophils Absolute: 0 10*3/uL (ref 0.0–0.1)
Basophils Relative: 0 %
Eosinophils Absolute: 0 10*3/uL (ref 0.0–0.5)
Eosinophils Relative: 0 %
HCT: 28.4 % — ABNORMAL LOW (ref 36.0–46.0)
Hemoglobin: 9.8 g/dL — ABNORMAL LOW (ref 12.0–15.0)
Immature Granulocytes: 1 %
Lymphocytes Relative: 25 %
Lymphs Abs: 1.9 10*3/uL (ref 0.7–4.0)
MCH: 33.1 pg (ref 26.0–34.0)
MCHC: 34.5 g/dL (ref 30.0–36.0)
MCV: 95.9 fL (ref 80.0–100.0)
Monocytes Absolute: 0.5 10*3/uL (ref 0.1–1.0)
Monocytes Relative: 6 %
Neutro Abs: 5.2 10*3/uL (ref 1.7–7.7)
Neutrophils Relative %: 68 %
Platelets: 213 10*3/uL (ref 150–400)
RBC: 2.96 MIL/uL — ABNORMAL LOW (ref 3.87–5.11)
RDW: 15.3 % (ref 11.5–15.5)
WBC: 7.6 10*3/uL (ref 4.0–10.5)
nRBC: 0 % (ref 0.0–0.2)

## 2020-05-06 LAB — SAMPLE TO BLOOD BANK

## 2020-05-06 LAB — COMPREHENSIVE METABOLIC PANEL
ALT: 9 U/L (ref 0–44)
AST: 13 U/L — ABNORMAL LOW (ref 15–41)
Albumin: 3.5 g/dL (ref 3.5–5.0)
Alkaline Phosphatase: 80 U/L (ref 38–126)
Anion gap: 11 (ref 5–15)
BUN: 13 mg/dL (ref 6–20)
CO2: 24 mmol/L (ref 22–32)
Calcium: 9.2 mg/dL (ref 8.9–10.3)
Chloride: 106 mmol/L (ref 98–111)
Creatinine, Ser: 0.91 mg/dL (ref 0.44–1.00)
GFR calc Af Amer: >60 mL/min (ref >60)
GFR calc non Af Amer: >60 mL/min (ref >60)
Glucose, Bld: 102 mg/dL — ABNORMAL HIGH (ref 70–99)
Potassium: 3.6 mmol/L (ref 3.5–5.1)
Sodium: 141 mmol/L (ref 135–145)
Total Bilirubin: 0.2 mg/dL — ABNORMAL LOW (ref 0.3–1.2)
Total Protein: 7.6 g/dL (ref 6.5–8.1)

## 2020-05-06 NOTE — Progress Notes (Signed)
Rebecca [name]  [phone] [phone]   ID: [name] DOB: 14-Sep-1976  MR#: [id number]  TDS#:[id number]  Patient Care Team: Dorena Dew, FNP as PCP - General (Family Medicine) Alphonsa Overall, MD as Consulting Physician (General Surgery) Magrinat, [name], MD as Consulting Physician (Oncology) Gery Pray, MD as Consulting Physician (Radiation Oncology) Delice Bison, Charlestine Massed, NP as Nurse Practitioner (Hematology and Oncology) Alda Berthold, DO as Consulting Physician (Neurology) OTHER MD:  CHIEF COMPLAINT: triple negative stage IV breast cancer  CURRENT TREATMENT: Doxil q. 21 days   INTERVAL HISTORY: Rebecca [name] returns today for follow-up and treatment of her triple negative breast cancer.    Due to disease progression, she was changed to Doxil, given on day 1 of a 21 day cycle, starting on 04/07/2020.  Her most recent echocardiogram on 04/02/2020 showed a stable EF of 50-55%.     REVIEW OF SYSTEMS: Rebecca [name] feels tired.  She says that her right chest wall wound is improving, and since starting the doxycycline that Dr. [name] prescribed the odor is less intense.  She is changing the dressing wet to dry daily.  She did not change it today so that we could examine it.  She denies any significant nausea, vomiting, bowel, or bladder issues.  Her pain is controlled with Tramadol.  She is taking this about once a day and notes that it is not making her too sleepy and increases her ability to function.  PMP aware reviewed today, no red flags.  She is active, and is hopeful that with her chest wall wound improving this means that her cancer is responding to the doxil.  A detailed ROS was otherwise non contributory today.    BREAST CANCER HISTORY: From the original intake note:  Jasiyah herself noted a change in her right breast sometime around September or October 2016. She did not bring it to intermediate medical attention, but on 01/13/2016 she  established herself in Dr. Smith Robert' service and she was set up for bilateral diagnostic mammography with tomosynthesis and bilateral ultrasonography at the [facility] 01/19/2016. The breast density was category B. In the upper outer quadrant of the right breast there was a spiculated mass measuring 2.8 cm. On physical exam this was palpable. Targeted ultrasonography confirmed an irregular hypoechoic mass in the right breast 11:30 o'clock position measuring 2.6 cm maximally. Ultrasound of the right axilla showed a morphologically abnormal lymph node.  In the left breast there were some tubular densities behind the areola which by ultrasonography showed benign ductal ectasia.  On 01/28/2016 [facility] underwent biopsy of the right breast mass and abnormal right axillary lymph node. The pathology from this procedure (S AAA [phone]) showed the lymph node to be benign. In the breast however there was an invasive ductal carcinoma, grade 3, which was estrogen and progesterone receptor negative. The proliferation marker was 70%. HER-2 was not amplified with a signals ratio of 1.32. The number per cell was 2.05.  The patient's subsequent history is as detailed below   PAST MEDICAL HISTORY: Past Medical History:  Diagnosis Date  . Anxiety   . Breast cancer ([place])   . Depression   . GERD (gastroesophageal reflux disease)   . History of radiation therapy 11/15/16-01/12/17   right chest wall and axilla treated to 45 Gy in 25 fractions, boosted and additional 14 Gy in 8 fractions  . Hypertension    diet controlled  . Obesity (BMI 35.0-39.9 without comorbidity)   . Pneumonia  as a child  . Seasonal allergies   . Sickle cell trait ([place])   . Termination of pregnancy (fetus) 04/02/16    PAST SURGICAL HISTORY: Past Surgical History:  Procedure Laterality Date  . CESAREAN SECTION     2004 and 2007  . MASTECTOMY W/ SENTINEL NODE BIOPSY Right 09/06/2016   Procedure: RIGHT BREAST MASTECTOMY WITH RIGHT AXILLARY  SENTINEL LYMPH NODE BIOPSY;  Surgeon: Alphonsa Overall, MD;  Location: [facility];  Service: General;  Laterality: Right;  . PORT-A-CATH REMOVAL Left 09/06/2016   Procedure: REMOVAL PORT-A-CATH;  Surgeon: Alphonsa Overall, MD;  Location: [facility];  Service: General;  Laterality: Left;  . PORTACATH PLACEMENT    . PORTACATH PLACEMENT N/A 07/09/2019   Procedure: INSERTION PORT-A-CATH WITH ULTRASOUND;  Surgeon: Alphonsa Overall, MD;  Location: [facility];  Service: General;  Laterality: N/A;    FAMILY HISTORY Family History  Problem Relation Age of Onset  . Hypertension Mother   . Cancer Mother        dx "intestinal cancer" in her 47s; +surgery  . Other Mother        hysterectomy at young age for unspecified cause  . Heart Problems Mother   . Breast cancer Cousin        maternal 1st cousin dx female breast cancer at 31-46y  . Cancer Father   . Hypertension Father   . Heart Problems Maternal Aunt   . Diabetes Maternal Aunt   . Breast cancer Maternal Uncle        dx 64-65  . Heart Problems Maternal Uncle   . Breast cancer Maternal Grandmother 49  . Throat cancer Maternal Grandfather        d. 101s; smoker  . Sickle cell anemia Paternal Aunt   . Congestive Heart Failure Maternal Aunt   . Multiple sclerosis Cousin   . Cancer Other        maternal great uncle (MGM's brother); cancer removed from his side  . Heart attack Paternal Aunt        d. early 44s  The patient has very little information about her father. Her mother is currently 59 years old. She had a history of cervical cancer at age 39. The patient had 2 brothers, no sisters. The maternal grandfather had throat cancer. A maternal uncle was diagnosed with breast cancer as well as prostate cancer at the age of 39. 2 maternal cousins, one of them female, had breast cancer as well.   GYNECOLOGIC HISTORY:  No LMP recorded. Menarche age 47, first live birth age 8. The patient is GX P4. She was still having regular periods  at the time of diagnosis. She took oral contraceptives in the 1990s with no side effects.--.  Stopped with chemotherapy and have not resumed as of May 2019   SOCIAL HISTORY:  (Updated 10/2018) She works as a Market researcher. The patient's significant other [name] works at [employer].  At home with the patient are her 3 children [name], [place] and [place]. There are age 56, 53, and 34 as of November 2019.  2 of them are disabled or have significant health problems, one with sickle cell disease, the other with autism and developmental delay.  The patient's son [name], currently 50 years old, lives in [place].    ADVANCED DIRECTIVES: Not in place   HEALTH MAINTENANCE: Social History   Tobacco Use  . Smoking status: Former Smoker    Packs/day:  1.00    Years: 20.00    Pack years: 20.00    Types: Cigarettes    Quit date: 04/01/2018    Years since quitting: 2.1  . Smokeless tobacco: Never Used  . Tobacco comment: Patient has quit smoking x 1 year now  Substance Use Topics  . Alcohol use: Yes    Comment: occ  . Drug use: No    Colonoscopy:  PAP:  Bone density:  Lipid panel:  No Known Allergies  Current Outpatient Medications on File Prior to Visit  Medication Sig Dispense Refill  . calcium carbonate (TUMS - DOSED IN MG ELEMENTAL CALCIUM) 500 MG chewable tablet Chew 1 tablet by mouth daily as needed for indigestion or heartburn.    . doxycycline (VIBRA-TABS) 100 MG tablet Take 1 tablet (100 mg total) by mouth daily. 30 tablet 4  . lidocaine-prilocaine (EMLA) cream Apply to affected area once 30 g 3  . LORazepam (ATIVAN) 0.5 MG tablet Take 1 tablet (0.5 mg total) by mouth daily as needed for anxiety. 30 tablet 0  . prochlorperazine (COMPAZINE) 10 MG tablet Take 1 tablet (10 mg total) by mouth every 6 (six) hours as needed (Nausea or vomiting). 30 tablet 1  . traMADol (ULTRAM) 50 MG tablet Take 1 tablet (50 mg total) by mouth every 6  (six) hours as needed. 30 tablet 0  . valACYclovir (VALTREX) 1000 MG tablet Take 1 tablet (1,000 mg total) by mouth daily. 90 tablet 1   No current facility-administered medications on file prior to visit.    OBJECTIVE: African-American woman   Vitals:   05/06/20 1142  BP: 110/76  Pulse: 100  Resp: 20  Temp: 98.2 F (36.8 C)  SpO2: 100%   Wt Readings from Last 3 Encounters:  05/06/20 202 lb 12.8 oz (92 kg)  04/28/20 204 lb 8 oz (92.8 kg)  04/21/20 203 lb 14.4 oz (92.5 kg)   Body mass index is 37.7 kg/m.    ECOG FS:1 - Symptomatic but completely ambulatory  GENERAL: Patient is a well appearing female in no acute distress HEENT:  Sclerae anicteric. Mask in place. Neck is supple.  NODES:  No cervical, supraclavicular, or axillary lymphadenopathy palpated.  BREAST EXAM:  See picture below, faint foul odor noted LUNGS:  Clear to auscultation bilaterally.  No wheezes or rhonchi. HEART:  Regular rate and rhythm. No murmur appreciated. ABDOMEN:  Soft, nontender.  Positive, normoactive bowel sounds. No organomegaly palpated. MSK:  No focal spinal tenderness to palpation. Full range of motion bilaterally in the upper extremities. EXTREMITIES:  No peripheral edema.   SKIN:  Clear with no obvious rashes or skin changes. No nail dyscrasia. NEURO:  Nonfocal. Well oriented.  Appropriate affect.  Right breast 05/06/2020   Right breast 04/21/2020      Right chest wall 02/11/2020.     Right chest wall 12/26/2019:     Right chest wall area 11/22/2019, which is the new baseline   LAB RESULTS: CMP Latest Ref Rng & Units 05/06/2020 04/28/2020 04/21/2020  Glucose 70 - 99 mg/dL 102(H) 131(H) 105(H)  BUN 6 - 20 mg/dL 13 6 9   Creatinine 0.44 - 1.00 mg/dL 0.91 0.80 0.86  Sodium 135 - 145 mmol/L 141 143 144  Potassium 3.5 - 5.1 mmol/L 3.6 3.2(L) 3.8  Chloride 98 - 111 mmol/L 106 108 104  CO2 22 - 32 mmol/L 24 26 29   Calcium 8.9 - 10.3 mg/dL 9.2 9.0 9.6  Total Protein 6.5 - 8.1  g/dL 7.6 7.3 7.8  Total Bilirubin 0.3 - 1.2 mg/dL 0.2(L) 0.2(L) 0.3  Alkaline Phos 38 - 126 U/L 80 69 77  AST 15 - 41 U/L 13(L) 12(L) 15  ALT 0 - 44 U/L 9 9 10     CBC    Component Value Date/Time   WBC 7.6 05/06/2020 1127   RBC 2.96 (L) 05/06/2020 1127   HGB 9.8 (L) 05/06/2020 1127   HGB 6.3 (LL) 11/08/2019 1350   HGB 10.4 (L) 10/04/2016 1154   HCT 28.4 (L) 05/06/2020 1127   HCT 31.3 (L) 10/04/2016 1154   PLT 213 05/06/2020 1127   PLT 10 (L) 11/08/2019 1350   PLT 320 10/04/2016 1154   MCV 95.9 05/06/2020 1127   MCV 95.7 10/04/2016 1154   MCH 33.1 05/06/2020 1127   MCHC 34.5 05/06/2020 1127   RDW 15.3 05/06/2020 1127   RDW 16.3 (H) 10/04/2016 1154   LYMPHSABS 1.9 05/06/2020 1127   LYMPHSABS 1.7 10/04/2016 1154   MONOABS 0.5 05/06/2020 1127   MONOABS 0.5 10/04/2016 1154   EOSABS 0.0 05/06/2020 1127   EOSABS 0.3 10/04/2016 1154   BASOSABS 0.0 05/06/2020 1127   BASOSABS 0.0 10/04/2016 1154    STUDIES: MR Abdomen W Wo Contrast  Result Date: 04/14/2020 CLINICAL DATA:  Breast cancer.  Concern for liver metastasis on CT. EXAM: MRI ABDOMEN WITHOUT AND WITH CONTRAST TECHNIQUE: Multiplanar multisequence MR imaging of the abdomen was performed both before and after the administration of intravenous contrast. CONTRAST:  55m GADAVIST GADOBUTROL 1 MMOL/ML IV SOLN COMPARISON:  CT 04/03/2019 FINDINGS: Lower chest:  Lung bases are clear. Hepatobiliary: No discrete lesion in the dome of the liver to correspond to the ill-defined region heterogeneous density described on comparison CT. No significant hepatic steatosis present. Postcontrast enhanced imaging demonstrates no enhancement through this region the dome of the liver. No additional enhancing hepatic lesion identified. Enhancement of the the transverse colon superimposed on the inferior RIGHT hepatic lobe is favored benign. (Image 45/15) There are multiple gallstones completely filling the lumen of the gallbladder (image 21/series 22). The  stones measure approximately 4 mm each in are too numerous to count. No gallbladder inflammation. Common bile duct normal caliber. No intrahepatic duct dilatation Pancreas: Normal pancreatic parenchymal intensity. No ductal dilatation or inflammation. Spleen: Normal spleen. Adrenals/urinary tract: Adrenal glands and kidneys are normal. Stomach/Bowel: Stomach and limited of the small bowel is unremarkable Vascular/Lymphatic: Abdominal aortic normal caliber. No retroperitoneal periportal lymphadenopathy. Musculoskeletal: No aggressive osseous lesion IMPRESSION: 1. No evidence liver metastasis. 2. Cholelithiasis without evidence of cholecystitis. Too numerous to count small gallstones present. No choledocholithiasis 3. No abdominal lymphadenopathy. Electronically Signed   By: SSuzy BouchardM.D.   On: 04/14/2020 13:02     RESEARCH: Referred to PREVENT study, but was pregnant at the time; referred to [facility] 11202, but the biopsied lymph node was benign; referred to weight loss study but declined; referred to [facility] 12/09/2000, but the timing of radiation and 8 was greater than 60 days past the date of diagnosis; referred to health disparity study, but declined; referred to [id number] adjuvant therapy study, but the patient received Xeloda with radiation and therefore was ineligible   ASSESSMENT: 44y.o. Rebecca [name] woman status post right breast upper-outer quadrant biopsy 01/28/2016 for a clinical T2 N0 invasive ductal carcinoma, grade 3, triple negative, with an MIB-1 of 70%.  (a) suspicious right axillary lymph node biopsied 01/28/2016 was benign  (1) neoadjuvant chemotherapy: doxorubicin and cyclophosphamide in dose dense fashion 4 started 04/14/16, completed 05/26/2016, followed  by paclitaxel and carboplatin weekly 12, Started 06/09/2016  (a) taxol discontinued after 7 doses because of neuropathy, last dose 07/21/2016  (2) genetics testing October 20, 2016 through the 32-gene Comprehensive  Cancer Panel offered by [facility] Junius Roads, MD) (with MSH2 Exons 1-7 Inversion Analysis) found no deleterious mutations or VUSS  In APC, ATM, AXIN2, BARD1, BMPR1A, BRCA1, BRCA2, BRIP1, CDH1, CDK4, CDKN2A, CHEK2, EPCAM, FANCC, MLH1, MSH2, MSH6, MUTYH, NBN, PALB2, PMS2, POLD1, POLE, PTEN, RAD51C, RAD51D, SCG5/GREM1, SMAD4, STK11, TP53, VHL, and XRCC2.    (3) right mastectomy and sentinel lymph node sampling 09/06/2016 showed a residual ypT1c ypN0, invasive ductal carcinoma, grade 3, with negative margins. Repeat prognostic panel again triple negative   (4) adjuvant radiation with capecitabine/Xeloda sensitization 11/15/16 - 01/12/17 Site/dose:   Right Chest Wall and axilla (4 field) treated to 45 Gy in 25 fractions, and then Boosted an additional 14.4 Gy in 8 fractions.  (5) tobacco abuse disorder: The patient quit smoking 04/04/2018  METASTATIC DISEASE:  July 2020: chest wall, bones, nodes (6) nonspecific changes noted on chest CT 06/11/2019 were clarified by PET scan 06/19/2019 showing hypermetabolic disease in the right anterior chest wall, right internal mammary nodes, right and left axillary nodes, but no metastatic disease in the neck, lungs, abdomen or pelvis.  Bone marrow uptake suggests bony metastatic disease.  (a) CARIS requested obtained from 06/28/2019 sample confirmed a triple negativity, the tumor also was negative for the androgen receptor, was MSI stable and mismatch repair status proficient, with a low mutational burden.  BRCA 1 and 2 were negative and PD-L1 was negative.  PI K3 showed a variant of uncertain significance.  However the tumor was genomic LOH high  (b) CA-27-29 is informative: was 118.3 on 06/04/2019  (7) zoledronate started 07/17/2019--on hold currently due to dental issues, and until 6 weeks at least after dental work  (8) carboplatin/ gemcitabine days 1 and 8 Q21 day cycle started 07/10/2019, last dose 10/30/2019  (a) restaging studies 09/2019 showed no  progression  (b) restaging studies after 6 cycles showed mild disease progression  (9) cyclophosphamide, methotrexate, fluorouracil chemotherapy started 11/13/2019, repeated every 21 days.  (a) discontinued after cycle 3 (12/31/2019): No evidence of response  (10) started capecitabine 01/21/2020, given 1 week on and 1 week off at standard doses (1500 mg twice daily)   (a) Discontinued after almost three months of treatment (last on 04/06/2020)  (b) Disease progression on 4/27 CT scan showing increasing right chest wall disease, left breast lesions, and ? Liver involvement.  (c) MRI liver pending.  (11) Started liposomal doxorubicin/Doxil given on day 1 of a 21 day cycle on 04/07/2020  (a) echo on 04/02/2020 shows EF of 50-55%, recommends repeat in 06/2020    PLAN: Rebecca [name] continues on Doxil given every 21 days.  She is tolerating this well and her labs are stable today.  We compared the picture of her chest wall today versus on 5/18 and clearly it is improving.  This is promising.  She is happy to hear this.  She will continue to do wet to dry dressings.    She and I discussed that we will re evaluate after three cycles.  She and I discussed Sacituzumab govotecan and the promising results it has had in trials for metastatic breast cancer.  This is our next step and she is happy that there are options for her in the future.    We reviewed her pain and her pain regimen and it is adequate.  She  and I discussed goals of treating her pain, which is to keep her functioning, and not too sleepy, and also to avoid other side effects like constipation that frequently can happen with opioids.  She understands that and we reviewed bowel regimens that can help avoid this.    We will see Rebecca [name] back in 2 weeks for labs, f/u and her next treatment with Doxil.  I am placing orders for her next echocardiogram that is due in July.  She was recommended to continue with the appropriate pandemic precautions. She knows to  call for any questions that may arise between now and her next appointment.  We are happy to see her sooner if needed.   Total encounter time today 30 minutes.Wilber Bihari, NP 05/08/20 8:13 AM Medical Oncology and Hematology [facility] [place], [name] [address] Tel. [phone]    Fax. [phone]    *Total Encounter Time as defined by the Centers for Medicare and Medicaid Services includes, in addition to the face-to-face time of a patient visit (documented in the note above) non-face-to-face time: obtaining and reviewing outside history, ordering and reviewing medications, tests or procedures, care coordination (communications with other health care professionals or caregivers) and documentation in the medical record.

## 2020-05-06 NOTE — Progress Notes (Signed)
Right chest wall [id number]

## 2020-05-07 ENCOUNTER — Telehealth: Payer: Self-pay | Admitting: Adult Health

## 2020-05-07 NOTE — Telephone Encounter (Signed)
No 6/2 los. No changes made to pt's schedule.

## 2020-05-18 NOTE — Progress Notes (Signed)
Rebecca [name]  [phone]) [phone] [phone]) [phone]   ID: Unknown Rebecca [name] DOB: 01/03/1976  MR#: [id number]  SHF#:[id number]  Patient Care Team: Dorena Dew, FNP as PCP - General (Family Medicine) Alphonsa Overall, MD as Consulting Physician (General Surgery) Dwanna Goshert, [name], MD as Consulting Physician (Oncology) Gery Pray, MD as Consulting Physician (Radiation Oncology) Delice Bison, Charlestine Massed, NP as Nurse Practitioner (Hematology and Oncology) Alda Berthold, DO as Consulting Physician (Neurology) OTHER MD:  CHIEF COMPLAINT: triple negative stage IV breast cancer  CURRENT TREATMENT: Doxil q. 21 days   INTERVAL HISTORY: [name] returns today for follow-up and treatment of her triple negative breast cancer.    Due to disease progression, she was changed to Doxil, given on day 1 of a 21 day cycle, starting on 04/07/2020. Today is day 1 cycle 3 of 4 planned.  Her most recent echocardiogram on 04/02/2020 showed a stable EF of 50-55%. She is due for repeat in July.   REVIEW OF SYSTEMS: [name] continues to work.  She says the kids at home are "okay".  She thinks the wound in her chest wall has a better looking base, pink now, and she dresses it every day, packing it and doing wet-to-dry.  She has developed a little bit of a rash due to the glue from the paper tape.  Otherwise a detailed review of systems today was stable.    BREAST CANCER HISTORY: From the original intake note:  Rebecca [name] herself noted a change in her right breast sometime around September or October 2016. She did not bring it to intermediate medical attention, but on 01/13/2016 she established herself in Dr. Smith Robert' service and she was set up for bilateral diagnostic mammography with tomosynthesis and bilateral ultrasonography at the [facility] 01/19/2016. The breast density was category B. In the upper outer quadrant of the right breast there was a spiculated mass measuring 2.8 cm. On physical exam  this was palpable. Targeted ultrasonography confirmed an irregular hypoechoic mass in the right breast 11:30 o'clock position measuring 2.6 cm maximally. Ultrasound of the right axilla showed a morphologically abnormal lymph node.  In the left breast there were some tubular densities behind the areola which by ultrasonography showed benign ductal ectasia.  On 01/28/2016 [facility] underwent biopsy of the right breast mass and abnormal right axillary lymph node. The pathology from this procedure (S AAA [id number]) showed the lymph node to be benign. In the breast however there was an invasive ductal carcinoma, grade 3, which was estrogen and progesterone receptor negative. The proliferation marker was 70%. HER-2 was not amplified with a signals ratio of 1.32. The number per cell was 2.05.  The patient's subsequent history is as detailed below   PAST MEDICAL HISTORY: Past Medical History:  Diagnosis Date  . Anxiety   . Breast cancer ([place])   . Depression   . GERD (gastroesophageal reflux disease)   . History of radiation therapy 11/15/16-01/12/17   right chest wall and axilla treated to 45 Gy in 25 fractions, boosted and additional 14 Gy in 8 fractions  . Hypertension    diet controlled  . Obesity (BMI 35.0-39.9 without comorbidity)   . Pneumonia    as a child  . Seasonal allergies   . Sickle cell trait ([place])   . Termination of pregnancy (fetus) 04/02/16    PAST SURGICAL HISTORY: Past Surgical History:  Procedure Laterality Date  . CESAREAN SECTION     2004 and 2007  . MASTECTOMY W/ SENTINEL NODE  BIOPSY Right 09/06/2016   Procedure: RIGHT BREAST MASTECTOMY WITH RIGHT AXILLARY SENTINEL LYMPH NODE BIOPSY;  Surgeon: Alphonsa Overall, MD;  Location: [facility];  Service: General;  Laterality: Right;  . PORT-A-CATH REMOVAL Left 09/06/2016   Procedure: REMOVAL PORT-A-CATH;  Surgeon: Alphonsa Overall, MD;  Location: [place];  Service: General;  Laterality: Left;  . PORTACATH  PLACEMENT    . PORTACATH PLACEMENT N/A 07/09/2019   Procedure: INSERTION PORT-A-CATH WITH ULTRASOUND;  Surgeon: Alphonsa Overall, MD;  Location[facility];  Service: General;  Laterality: N/A;    FAMILY HISTORY Family History  Problem Relation Age of Onset  . Hypertension Mother   . Cancer Mother        dx "intestinal cancer" in her 27s; +surgery  . Other Mother        hysterectomy at young age for unspecified cause  . Heart Problems Mother   . Breast cancer Cousin        maternal 1st cousin dx female breast cancer at 43-46y  . Cancer Father   . Hypertension Father   . Heart Problems Maternal Aunt   . Diabetes Maternal Aunt   . Breast cancer Maternal Uncle        dx 64-65  . Heart Problems Maternal Uncle   . Breast cancer Maternal Grandmother 25  . Throat cancer Maternal Grandfather        d. 49s; smoker  . Sickle cell anemia Paternal Aunt   . Congestive Heart Failure Maternal Aunt   . Multiple sclerosis Cousin   . Cancer Other        maternal great uncle (MGM's brother); cancer removed from his side  . Heart attack Paternal Aunt        d. early 84s  The patient has very little information about her father. Her mother is currently 33 years old. She had a history of cervical cancer at age 23. The patient had 2 brothers, no sisters. The maternal grandfather had throat cancer. A maternal uncle was diagnosed with breast cancer as well as prostate cancer at the age of 91. 2 maternal cousins, one of them female, had breast cancer as well.   GYNECOLOGIC HISTORY:  No LMP recorded. Menarche age 60, first live birth age 59. The patient is GX P4. She was still having regular periods at the time of diagnosis. She took oral contraceptives in the 1990s with no side effects.--.  Stopped with chemotherapy and have not resumed as of May 2019   SOCIAL HISTORY:  (Updated 10/2018) She works as a Market researcher. The patient's significant other Rebecca [name] works at [employer].  At home with the  patient are her 3 children Rebecca [name], Rebecca [name] and Rebecca [name]. There are age 13, 32, and 5 as of November 2019.  2 of them are disabled or have significant health problems, one with sickle cell disease, the other with autism and developmental delay.  The patient's son [name], currently 74 years old, lives in [place].    ADVANCED DIRECTIVES: Not in place   HEALTH MAINTENANCE: Social History   Tobacco Use  . Smoking status: Former Smoker    Packs/day: 1.00    Years: 20.00    Pack years: 20.00    Types: Cigarettes    Quit date: 04/01/2018    Years since quitting: 2.1  . Smokeless tobacco: Never Used  . Tobacco comment: Patient has quit smoking x 1 year now  Vaping Use  .  Vaping Use: Never used  Substance Use Topics  . Alcohol use: Yes    Comment: occ  . Drug use: No    Colonoscopy:  PAP:  Bone density:  Lipid panel:  No Known Allergies  Current Outpatient Medications on File Prior to Visit  Medication Sig Dispense Refill  . calcium carbonate (TUMS - DOSED IN MG ELEMENTAL CALCIUM) 500 MG chewable tablet Chew 1 tablet by mouth daily as needed for indigestion or heartburn.    . doxycycline (VIBRA-TABS) 100 MG tablet Take 1 tablet (100 mg total) by mouth daily. 30 tablet 4  . lidocaine-prilocaine (EMLA) cream Apply to affected area once 30 g 3  . LORazepam (ATIVAN) 0.5 MG tablet Take 1 tablet (0.5 mg total) by mouth daily as needed for anxiety. 30 tablet 0  . prochlorperazine (COMPAZINE) 10 MG tablet Take 1 tablet (10 mg total) by mouth every 6 (six) hours as needed (Nausea or vomiting). 30 tablet 1  . traMADol (ULTRAM) 50 MG tablet Take 1 tablet (50 mg total) by mouth every 6 (six) hours as needed. 30 tablet 0  . valACYclovir (VALTREX) 1000 MG tablet Take 1 tablet (1,000 mg total) by mouth daily. 90 tablet 1   No current facility-administered medications on file prior to visit.    OBJECTIVE: African-American woman who appears stated age  44:     05/19/20 0856  BP: 119/85  Pulse: 97  Resp: 17  Temp: 98 F (36.7 C)  SpO2: 100%   Wt Readings from Last 3 Encounters:  05/19/20 205 lb 8 oz (93.2 kg)  05/06/20 202 lb 12.8 oz (92 kg)  04/28/20 204 lb 8 oz (92.8 kg)   Body mass index is 38.2 kg/m.    ECOG FS:1 - Symptomatic but completely ambulatory  Sclerae unicteric, EOMs intact Wearing a mask No cervical or supraclavicular adenopathy Lungs no rales or rhonchi Heart regular rate and rhythm Abd soft, nontender, positive bowel sounds MSK no focal spinal tenderness, no upper extremity lymphedema Neuro: nonfocal, well oriented, appropriate affect Breasts: Right breast is status post mastectomy.  There is a chest wound which does look more "healthy" in the last time, with a pink base, but not obviously improved otherwise as compared to baseline.   Right breast 05/06/2020    Right chest wall area 11/22/2019, which is the new baseline   LAB RESULTS: CMP Latest Ref Rng & Units 05/06/2020 04/28/2020 04/21/2020  Glucose 70 - 99 mg/dL 102(H) 131(H) 105(H)  BUN 6 - 20 mg/dL _0 Creatinine 0.44 - 1.00 mg/dL 0.91 0.80 0.86  Sodium 135 - 145 mmol/L 141 143 144  Potassium 3.5 - 5.1 mmol/L 3.6 3.2(L) 3.8  Chloride 98 - 111 mmol/L 106 108 104  CO2 22 - 32 mmol/L _1 Calcium 8.9 - 10.3 mg/dL 9.2 9.0 9.6  Total Protein 6.5 - 8.1 g/dL 7.6 7.3 7.8  Total Bilirubin 0.3 - 1.2 mg/dL 0.2(L) 0.2(L) 0.3  Alkaline Phos 38 - 126 U/L 80 69 77  AST 15 - 41 U/L 13(L) 12(L) 15  ALT 0 - 44 U/L _2 CBC    Component Value Date/Time   WBC 6.1 05/19/2020 0840   RBC 3.04 (L) 05/19/2020 0840   RBC 3.03 (L) 05/19/2020 0840   HGB 10.0 (L) 05/19/2020 0840   HGB 6.3 (LL) 11/08/2019 1350   HGB 10.4 (L) 10/04/2016 1154   HCT 29.3 (L) 05/19/2020 0840   HCT 31.3 (L) 10/04/2016 1154  PLT 190 05/19/2020 0840   PLT 10 (L) 11/08/2019 1350   PLT 320 10/04/2016 1154   MCV 96.4 05/19/2020 0840   MCV 95.7 10/04/2016 1154   MCH 32.9  05/19/2020 0840   MCHC 34.1 05/19/2020 0840   RDW 15.8 (H) 05/19/2020 0840   RDW 16.3 (H) 10/04/2016 1154   LYMPHSABS 1.5 05/19/2020 0840   LYMPHSABS 1.7 10/04/2016 1154   MONOABS 0.8 05/19/2020 0840   MONOABS 0.5 10/04/2016 1154   EOSABS 0.2 05/19/2020 0840   EOSABS 0.3 10/04/2016 1154   BASOSABS 0.0 05/19/2020 0840   BASOSABS 0.0 10/04/2016 1154    STUDIES: No results found.   RESEARCH: Referred to PREVENT study, but was pregnant at the time; referred to [facility] 11202, but the biopsied lymph node was benign; referred to weight loss study but declined; referred to [facility] 12/09/2000, but the timing of radiation and 8 was greater than 60 days past the date of diagnosis; referred to health disparity study, but declined; referred to [id number] adjuvant therapy study, but the patient received Xeloda with radiation and therefore was ineligible   ASSESSMENT: 44 y.o. [place] woman status post right breast upper-outer quadrant biopsy 01/28/2016 for a clinical T2 N0 invasive ductal carcinoma, grade 3, triple negative, with an MIB-1 of 70%.  (a) suspicious right axillary lymph node biopsied 01/28/2016 was benign  (1) neoadjuvant chemotherapy: doxorubicin and cyclophosphamide in dose dense fashion 4 started 04/14/16, completed 05/26/2016, followed by paclitaxel and carboplatin weekly 12, Started 06/09/2016  (a) taxol discontinued after 7 doses because of neuropathy, last dose 07/21/2016  (2) genetics testing October 20, 2016 through the 32-gene Comprehensive Cancer Panel offered by [facility] [place] Roads, MD) (with MSH2 Exons 1-7 Inversion Analysis) found no deleterious mutations or VUSS  In APC, ATM, AXIN2, BARD1, BMPR1A, BRCA1, BRCA2, BRIP1, CDH1, CDK4, CDKN2A, CHEK2, EPCAM, FANCC, MLH1, MSH2, MSH6, MUTYH, NBN, PALB2, PMS2, POLD1, POLE, PTEN, RAD51C, RAD51D, SCG5/GREM1, SMAD4, STK11, TP53, VHL, and XRCC2.    (3) right mastectomy and sentinel lymph node sampling 09/06/2016  showed a residual ypT1c ypN0, invasive ductal carcinoma, grade 3, with negative margins. Repeat prognostic panel again triple negative   (4) adjuvant radiation with capecitabine/Xeloda sensitization 11/15/16 - 01/12/17 Site/dose:   Right Chest Wall and axilla (4 field) treated to 45 Gy in 25 fractions, and then Boosted an additional 14.4 Gy in 8 fractions.  (5) tobacco abuse disorder: The patient quit smoking 04/04/2018  METASTATIC DISEASE:  July 2020: chest wall, bones, nodes (6) nonspecific changes noted on chest CT 06/11/2019 were clarified by PET scan 06/19/2019 showing hypermetabolic disease in the right anterior chest wall, right internal mammary nodes, right and left axillary nodes, but no metastatic disease in the neck, lungs, abdomen or pelvis.  Bone marrow uptake suggests bony metastatic disease.  (a) CARIS requested obtained from 06/28/2019 sample confirmed a triple negativity, the tumor also was negative for the androgen receptor, was MSI stable and mismatch repair status proficient, with a low mutational burden.  BRCA 1 and 2 were negative and PD-L1 was negative.  PI K3 showed a variant of uncertain significance.  However the tumor was genomic LOH high  (b) CA-27-29 is informative: was 118.3 on 06/04/2019  (7) zoledronate started 07/17/2019--on hold currently due to dental issues, and until 6 weeks at least after dental work  (8) carboplatin/ gemcitabine days 1 and 8 Q21 day cycle started 07/10/2019, last dose 10/30/2019  (a) restaging studies 09/2019 showed no progression  (b) restaging studies after 6 cycles showed  mild disease progression  (9) cyclophosphamide, methotrexate, fluorouracil chemotherapy started 11/13/2019, repeated every 21 days.  (a) discontinued after cycle 3 (12/31/2019): No evidence of response  (10) started capecitabine 01/21/2020, given 1 week on and 1 week off at standard doses (1500 mg twice daily)   (a) Discontinued after almost three months of treatment  (last on 04/06/2020)  (b) Disease progression on 4/27 CT scan showing increasing right chest wall disease, left breast lesions, and ? Liver involvement.  (c) MRI liver 04/14/2020 shows no liver invovlement  (11) Started liposomal doxorubicin/Doxil given on day 1 of a 21 day cycle on 04/07/2020  (a) echo on 04/02/2020 shows EF of 50-55%, repeat in 06/2020    PLAN: Sherlonda is tolerating Doxil quite well.  This is not a fast acting chemo drug and it is difficult to tell whether it is actually working or not.  She is clearly not much worse.  She may be slightly better.  We discussed whether doing a CT scan before the fourth dose or after an decided to go ahead and get a fourth dose just to make 100% sure before we give up on this treatment which she is tolerating well.  Accordingly she will receive Doxil today and 3 weeks from today after which she will be restaged  She knows to call for any other issue that may develop before then  Total encounter time 25 minutes.Sarajane Jews C. Lara Palinkas, MD 05/19/20 9:11 AM Medical Oncology and Hematology [facility] [place], [place] [address] Tel. [phone]    Fax. [phone]   I, Wilburn Mylar, am acting as scribe for Dr. [name]. Madalena Kesecker.  I, Lurline Del MD, have reviewed the above documentation for accuracy and completeness, and I agree with the above.    *Total Encounter Time as defined by the Centers for Medicare and Medicaid Services includes, in addition to the face-to-face time of a patient visit (documented in the note above) non-face-to-face time: obtaining and reviewing outside history, ordering and reviewing medications, tests or procedures, care coordination (communications with other health care professionals or caregivers) and documentation in the medical record.

## 2020-05-19 ENCOUNTER — Inpatient Hospital Stay: Payer: Medicaid Other

## 2020-05-19 ENCOUNTER — Inpatient Hospital Stay (HOSPITAL_BASED_OUTPATIENT_CLINIC_OR_DEPARTMENT_OTHER): Payer: Medicaid Other | Admitting: Oncology

## 2020-05-19 ENCOUNTER — Other Ambulatory Visit: Payer: Self-pay

## 2020-05-19 VITALS — BP 119/85 | HR 97 | Temp 98.0°F | Resp 17 | Ht 61.5 in | Wt 205.5 lb

## 2020-05-19 DIAGNOSIS — C50919 Malignant neoplasm of unspecified site of unspecified female breast: Secondary | ICD-10-CM

## 2020-05-19 DIAGNOSIS — Z171 Estrogen receptor negative status [ER-]: Secondary | ICD-10-CM

## 2020-05-19 DIAGNOSIS — C50411 Malignant neoplasm of upper-outer quadrant of right female breast: Secondary | ICD-10-CM | POA: Diagnosis not present

## 2020-05-19 DIAGNOSIS — C7951 Secondary malignant neoplasm of bone: Secondary | ICD-10-CM

## 2020-05-19 DIAGNOSIS — Z7189 Other specified counseling: Secondary | ICD-10-CM

## 2020-05-19 LAB — CBC WITH DIFFERENTIAL/PLATELET
Abs Immature Granulocytes: 0.02 10*3/uL (ref 0.00–0.07)
Basophils Absolute: 0 10*3/uL (ref 0.0–0.1)
Basophils Relative: 1 %
Eosinophils Absolute: 0.2 10*3/uL (ref 0.0–0.5)
Eosinophils Relative: 3 %
HCT: 29.3 % — ABNORMAL LOW (ref 36.0–46.0)
Hemoglobin: 10 g/dL — ABNORMAL LOW (ref 12.0–15.0)
Immature Granulocytes: 0 %
Lymphocytes Relative: 25 %
Lymphs Abs: 1.5 10*3/uL (ref 0.7–4.0)
MCH: 32.9 pg (ref 26.0–34.0)
MCHC: 34.1 g/dL (ref 30.0–36.0)
MCV: 96.4 fL (ref 80.0–100.0)
Monocytes Absolute: 0.8 10*3/uL (ref 0.1–1.0)
Monocytes Relative: 13 %
Neutro Abs: 3.5 10*3/uL (ref 1.7–7.7)
Neutrophils Relative %: 58 %
Platelets: 190 10*3/uL (ref 150–400)
RBC: 3.04 MIL/uL — ABNORMAL LOW (ref 3.87–5.11)
RDW: 15.8 % — ABNORMAL HIGH (ref 11.5–15.5)
WBC: 6.1 10*3/uL (ref 4.0–10.5)
nRBC: 0 % (ref 0.0–0.2)

## 2020-05-19 LAB — COMPREHENSIVE METABOLIC PANEL
ALT: 10 U/L (ref 0–44)
AST: 13 U/L — ABNORMAL LOW (ref 15–41)
Albumin: 3.7 g/dL (ref 3.5–5.0)
Alkaline Phosphatase: 71 U/L (ref 38–126)
Anion gap: 11 (ref 5–15)
BUN: 8 mg/dL (ref 6–20)
CO2: 26 mmol/L (ref 22–32)
Calcium: 9.1 mg/dL (ref 8.9–10.3)
Chloride: 107 mmol/L (ref 98–111)
Creatinine, Ser: 0.79 mg/dL (ref 0.44–1.00)
GFR calc Af Amer: >60 mL/min (ref >60)
GFR calc non Af Amer: >60 mL/min (ref >60)
Glucose, Bld: 112 mg/dL — ABNORMAL HIGH (ref 70–99)
Potassium: 3.6 mmol/L (ref 3.5–5.1)
Sodium: 144 mmol/L (ref 135–145)
Total Bilirubin: 0.2 mg/dL — ABNORMAL LOW (ref 0.3–1.2)
Total Protein: 7.4 g/dL (ref 6.5–8.1)

## 2020-05-19 LAB — FERRITIN: Ferritin: 407 ng/mL — ABNORMAL HIGH (ref 11–307)

## 2020-05-19 LAB — SAMPLE TO BLOOD BANK

## 2020-05-19 LAB — RETICULOCYTES
Immature Retic Fract: 28.8 % — ABNORMAL HIGH (ref 2.3–15.9)
RBC.: 3.03 MIL/uL — ABNORMAL LOW (ref 3.87–5.11)
Retic Count, Absolute: 55.8 10*3/uL (ref 19.0–186.0)
Retic Ct Pct: 1.8 % (ref 0.4–3.1)

## 2020-05-19 MED ORDER — DEXTROSE 5 % IV SOLN
Freq: Once | INTRAVENOUS | Status: AC
  Filled 2020-05-19: qty 250

## 2020-05-19 MED ORDER — SODIUM CHLORIDE 0.9 % IV SOLN
10.0000 mg | Freq: Once | INTRAVENOUS | Status: AC
  Administered 2020-05-19: 10 mg via INTRAVENOUS
  Filled 2020-05-19: qty 10

## 2020-05-19 MED ORDER — DOXORUBICIN HCL LIPOSOMAL CHEMO INJECTION 2 MG/ML
40.0000 mg/m2 | Freq: Once | INTRAVENOUS | Status: AC
  Administered 2020-05-19: 80 mg via INTRAVENOUS
  Filled 2020-05-19: qty 25

## 2020-05-19 MED ORDER — SODIUM CHLORIDE 0.9% FLUSH
10.0000 mL | INTRAVENOUS | Status: DC | PRN
  Administered 2020-05-19: 10 mL
  Filled 2020-05-19: qty 10

## 2020-05-19 MED ORDER — DIPHENHYDRAMINE HCL 25 MG PO CAPS
ORAL_CAPSULE | ORAL | Status: AC
  Filled 2020-05-19: qty 1

## 2020-05-19 MED ORDER — FAMOTIDINE IN NACL 20-0.9 MG/50ML-% IV SOLN
INTRAVENOUS | Status: AC
  Filled 2020-05-19: qty 50

## 2020-05-19 MED ORDER — HEPARIN SOD (PORK) LOCK FLUSH 100 UNIT/ML IV SOLN
500.0000 [IU] | Freq: Once | INTRAVENOUS | Status: AC | PRN
  Administered 2020-05-19: 500 [IU]
  Filled 2020-05-19: qty 5

## 2020-05-19 MED ORDER — DIPHENHYDRAMINE HCL 25 MG PO CAPS
25.0000 mg | ORAL_CAPSULE | Freq: Once | ORAL | Status: AC
  Administered 2020-05-19: 25 mg via ORAL

## 2020-05-19 MED ORDER — FAMOTIDINE IN NACL 20-0.9 MG/50ML-% IV SOLN
20.0000 mg | Freq: Once | INTRAVENOUS | Status: AC
  Administered 2020-05-19: 20 mg via INTRAVENOUS

## 2020-05-19 NOTE — Patient Instructions (Signed)
[name] Discharge Instructions for Patients Receiving Chemotherapy  Today you received the following chemotherapy agents Doxil   To help prevent nausea and vomiting after your treatment, we encourage you to take your nausea medication as directed.   If you develop nausea and vomiting that is not controlled by your nausea medication, call the clinic.   BELOW ARE SYMPTOMS THAT SHOULD BE REPORTED IMMEDIATELY:  *FEVER GREATER THAN 100.5 F  *CHILLS WITH OR WITHOUT FEVER  NAUSEA AND VOMITING THAT IS NOT CONTROLLED WITH YOUR NAUSEA MEDICATION  *UNUSUAL SHORTNESS OF BREATH  *UNUSUAL BRUISING OR BLEEDING  TENDERNESS IN MOUTH AND THROAT WITH OR WITHOUT PRESENCE OF ULCERS  *URINARY PROBLEMS  *BOWEL PROBLEMS  UNUSUAL RASH Items with * indicate a potential emergency and should be followed up as soon as possible.  Feel free to call the clinic should you have any questions or concerns. The clinic phone number is [phone].  Please show the CHEMO ALERT CARD at check-in to the Emergency Department and triage nurse.   

## 2020-05-19 NOTE — Patient Instructions (Signed)

## 2020-05-20 ENCOUNTER — Telehealth: Payer: Self-pay | Admitting: Oncology

## 2020-05-20 LAB — CANCER ANTIGEN 27.29: CA 27.29: 441.6 U/mL — ABNORMAL HIGH (ref 0.0–38.6)

## 2020-05-20 NOTE — Telephone Encounter (Signed)
No 5/15 los. No changes made to pt's schedule.

## 2020-06-02 ENCOUNTER — Ambulatory Visit: Payer: Medicaid Other

## 2020-06-02 ENCOUNTER — Other Ambulatory Visit: Payer: Medicaid Other

## 2020-06-02 ENCOUNTER — Ambulatory Visit: Payer: Medicaid Other | Admitting: Adult Health

## 2020-06-09 ENCOUNTER — Other Ambulatory Visit: Payer: Self-pay

## 2020-06-09 ENCOUNTER — Inpatient Hospital Stay: Payer: Medicaid Other

## 2020-06-09 ENCOUNTER — Inpatient Hospital Stay: Payer: Medicaid Other | Attending: Oncology

## 2020-06-09 VITALS — BP 104/64 | HR 99 | Temp 98.1°F | Resp 18 | Wt 203.0 lb

## 2020-06-09 DIAGNOSIS — C50919 Malignant neoplasm of unspecified site of unspecified female breast: Secondary | ICD-10-CM

## 2020-06-09 DIAGNOSIS — Z171 Estrogen receptor negative status [ER-]: Secondary | ICD-10-CM

## 2020-06-09 DIAGNOSIS — C7951 Secondary malignant neoplasm of bone: Secondary | ICD-10-CM

## 2020-06-09 DIAGNOSIS — Z5111 Encounter for antineoplastic chemotherapy: Secondary | ICD-10-CM | POA: Diagnosis not present

## 2020-06-09 DIAGNOSIS — Z79899 Other long term (current) drug therapy: Secondary | ICD-10-CM | POA: Diagnosis not present

## 2020-06-09 DIAGNOSIS — Z7189 Other specified counseling: Secondary | ICD-10-CM

## 2020-06-09 DIAGNOSIS — Z170 Estrogen receptor positive status [ER+]: Secondary | ICD-10-CM | POA: Diagnosis not present

## 2020-06-09 DIAGNOSIS — C50411 Malignant neoplasm of upper-outer quadrant of right female breast: Secondary | ICD-10-CM | POA: Diagnosis not present

## 2020-06-09 LAB — CBC WITH DIFFERENTIAL/PLATELET
Abs Immature Granulocytes: 0.07 10*3/uL (ref 0.00–0.07)
Basophils Absolute: 0 10*3/uL (ref 0.0–0.1)
Basophils Relative: 1 %
Eosinophils Absolute: 0.1 10*3/uL (ref 0.0–0.5)
Eosinophils Relative: 2 %
HCT: 28.8 % — ABNORMAL LOW (ref 36.0–46.0)
Hemoglobin: 9.6 g/dL — ABNORMAL LOW (ref 12.0–15.0)
Immature Granulocytes: 1 %
Lymphocytes Relative: 23 %
Lymphs Abs: 1.4 10*3/uL (ref 0.7–4.0)
MCH: 31.7 pg (ref 26.0–34.0)
MCHC: 33.3 g/dL (ref 30.0–36.0)
MCV: 95 fL (ref 80.0–100.0)
Monocytes Absolute: 0.9 10*3/uL (ref 0.1–1.0)
Monocytes Relative: 14 %
Neutro Abs: 3.7 10*3/uL (ref 1.7–7.7)
Neutrophils Relative %: 59 %
Platelets: 190 10*3/uL (ref 150–400)
RBC: 3.03 MIL/uL — ABNORMAL LOW (ref 3.87–5.11)
RDW: 16.1 % — ABNORMAL HIGH (ref 11.5–15.5)
WBC: 6.2 10*3/uL (ref 4.0–10.5)
nRBC: 0 % (ref 0.0–0.2)

## 2020-06-09 LAB — COMPREHENSIVE METABOLIC PANEL
ALT: 11 U/L (ref 0–44)
AST: 17 U/L (ref 15–41)
Albumin: 3.7 g/dL (ref 3.5–5.0)
Alkaline Phosphatase: 66 U/L (ref 38–126)
Anion gap: 12 (ref 5–15)
BUN: 9 mg/dL (ref 6–20)
CO2: 26 mmol/L (ref 22–32)
Calcium: 9.6 mg/dL (ref 8.9–10.3)
Chloride: 104 mmol/L (ref 98–111)
Creatinine, Ser: 0.96 mg/dL (ref 0.44–1.00)
GFR calc Af Amer: >60 mL/min (ref >60)
GFR calc non Af Amer: >60 mL/min (ref >60)
Glucose, Bld: 93 mg/dL (ref 70–99)
Potassium: 3.3 mmol/L — ABNORMAL LOW (ref 3.5–5.1)
Sodium: 142 mmol/L (ref 135–145)
Total Bilirubin: 0.3 mg/dL (ref 0.3–1.2)
Total Protein: 7.7 g/dL (ref 6.5–8.1)

## 2020-06-09 LAB — FERRITIN: Ferritin: 463 ng/mL — ABNORMAL HIGH (ref 11–307)

## 2020-06-09 LAB — SAMPLE TO BLOOD BANK

## 2020-06-09 LAB — RETICULOCYTES
Immature Retic Fract: 27.8 % — ABNORMAL HIGH (ref 2.3–15.9)
RBC.: 3.02 MIL/uL — ABNORMAL LOW (ref 3.87–5.11)
Retic Count, Absolute: 53.5 10*3/uL (ref 19.0–186.0)
Retic Ct Pct: 1.8 % (ref 0.4–3.1)

## 2020-06-09 MED ORDER — SODIUM CHLORIDE 0.9% FLUSH
10.0000 mL | INTRAVENOUS | Status: DC | PRN
  Administered 2020-06-09: 10 mL
  Filled 2020-06-09: qty 10

## 2020-06-09 MED ORDER — DIPHENHYDRAMINE HCL 25 MG PO CAPS
25.0000 mg | ORAL_CAPSULE | Freq: Once | ORAL | Status: AC
  Administered 2020-06-09: 25 mg via ORAL

## 2020-06-09 MED ORDER — FAMOTIDINE IN NACL 20-0.9 MG/50ML-% IV SOLN
20.0000 mg | Freq: Once | INTRAVENOUS | Status: AC
  Administered 2020-06-09: 20 mg via INTRAVENOUS

## 2020-06-09 MED ORDER — DOXORUBICIN HCL LIPOSOMAL CHEMO INJECTION 2 MG/ML
40.0000 mg/m2 | Freq: Once | INTRAVENOUS | Status: AC
  Administered 2020-06-09: 80 mg via INTRAVENOUS
  Filled 2020-06-09: qty 40

## 2020-06-09 MED ORDER — SODIUM CHLORIDE 0.9 % IV SOLN
10.0000 mg | Freq: Once | INTRAVENOUS | Status: AC
  Administered 2020-06-09: 10 mg via INTRAVENOUS
  Filled 2020-06-09: qty 10

## 2020-06-09 MED ORDER — DEXTROSE 5 % IV SOLN
Freq: Once | INTRAVENOUS | Status: AC
  Filled 2020-06-09: qty 250

## 2020-06-09 MED ORDER — DIPHENHYDRAMINE HCL 25 MG PO CAPS
ORAL_CAPSULE | ORAL | Status: AC
  Filled 2020-06-09: qty 1

## 2020-06-09 MED ORDER — SODIUM CHLORIDE 0.9% FLUSH
10.0000 mL | INTRAVENOUS | Status: DC | PRN
  Administered 2020-06-09 (×2): 10 mL
  Filled 2020-06-09: qty 10

## 2020-06-09 MED ORDER — HEPARIN SOD (PORK) LOCK FLUSH 100 UNIT/ML IV SOLN
500.0000 [IU] | Freq: Once | INTRAVENOUS | Status: AC | PRN
  Administered 2020-06-09: 500 [IU]
  Filled 2020-06-09: qty 5

## 2020-06-09 MED ORDER — FAMOTIDINE IN NACL 20-0.9 MG/50ML-% IV SOLN
INTRAVENOUS | Status: AC
  Filled 2020-06-09: qty 50

## 2020-06-10 LAB — CANCER ANTIGEN 27.29: CA 27.29: 407.5 U/mL — ABNORMAL HIGH (ref 0.0–38.6)

## 2020-06-10 NOTE — Telephone Encounter (Signed)
No entry 

## 2020-06-19 IMAGING — CT CT CHEST W/ CM
2 of 5 series · 13 of 36 positions shown, 16 images · IV contrast (OMNIPAQUE)
Comparison: PET-CT, 06/19/2019, CT chest, 06/11/2019

CLINICAL DATA: Recurrent breast cancer, status post mastectomy

EXAM:
CT CHEST, ABDOMEN, AND PELVIS WITH CONTRAST
TECHNIQUE: Multidetector CT imaging of the chest, abdomen and pelvis was
performed following the standard protocol during bolus
administration of intravenous contrast.
CONTRAST:  100mL OMNIPAQUE IOHEXOL 300 MG/ML SOLN, additional oral
enteric contrast

[Series 2: cap with · axial · 0.80mm/px · z∈[-577,-92]mm · 10 of 119 slices shown, 13 images]
[im 11/119  mediastinal]
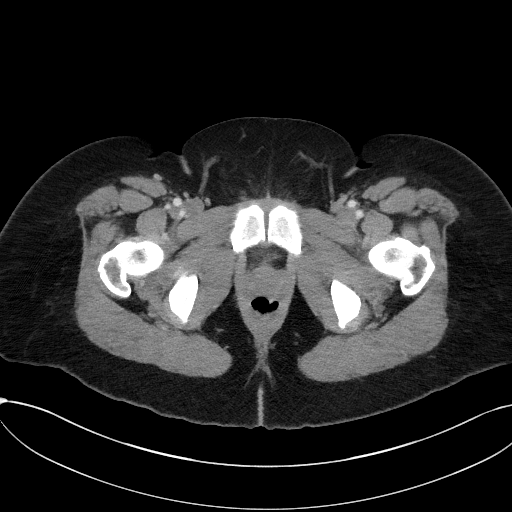
[im 11/119  lung]
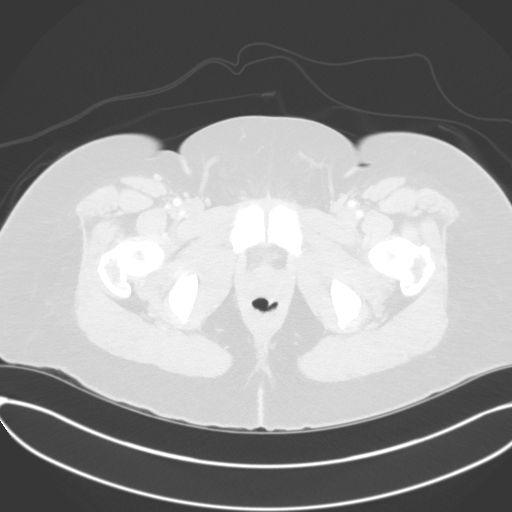
[im 22/119  lung]
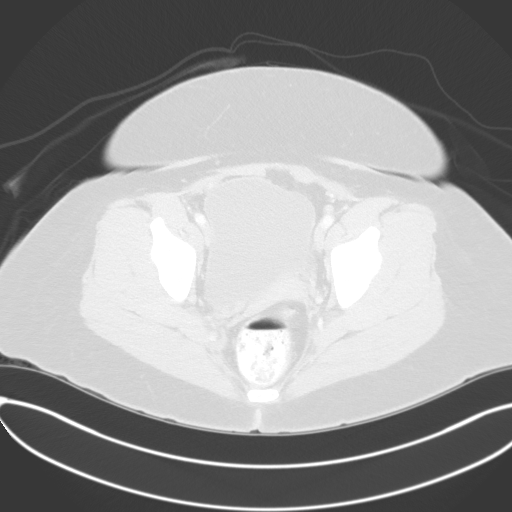
[im 33/119  lung]
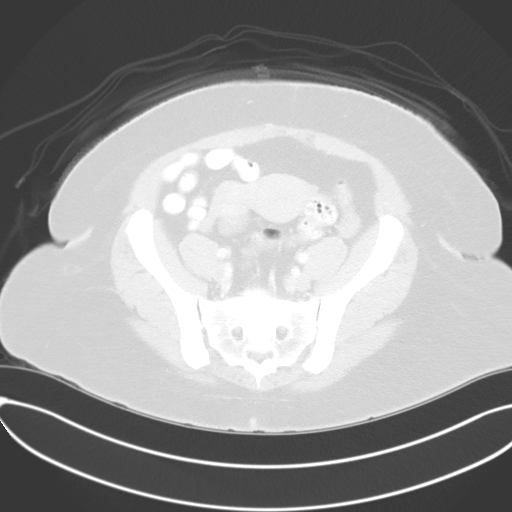
[im 43/119  lung]
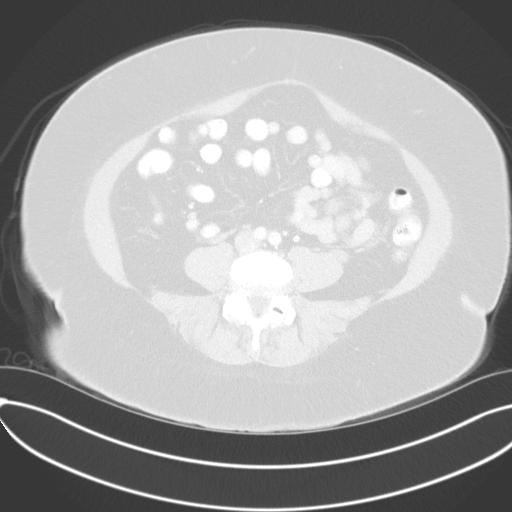
[im 54/119  mediastinal]
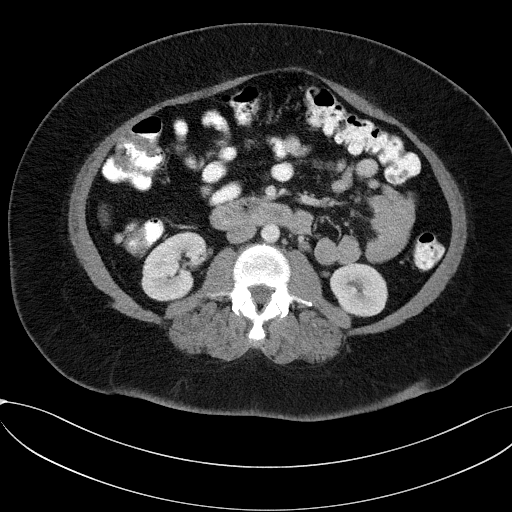
[im 54/119  lung]
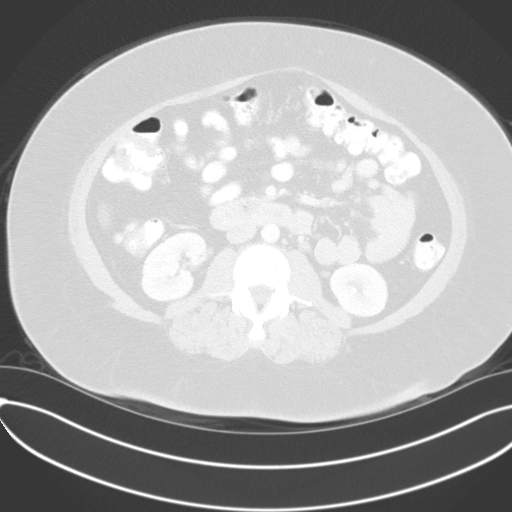
[im 65/119  lung]
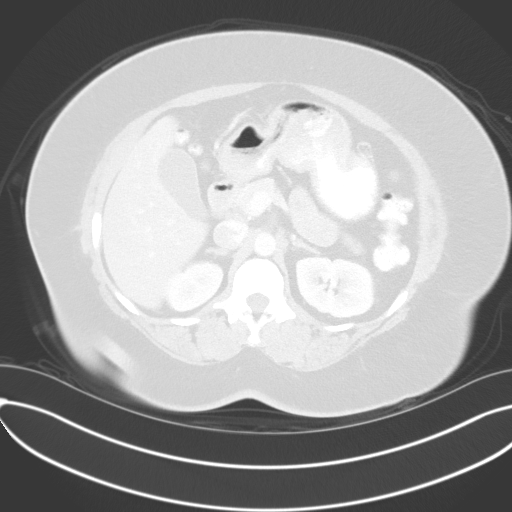
[im 76/119  lung]
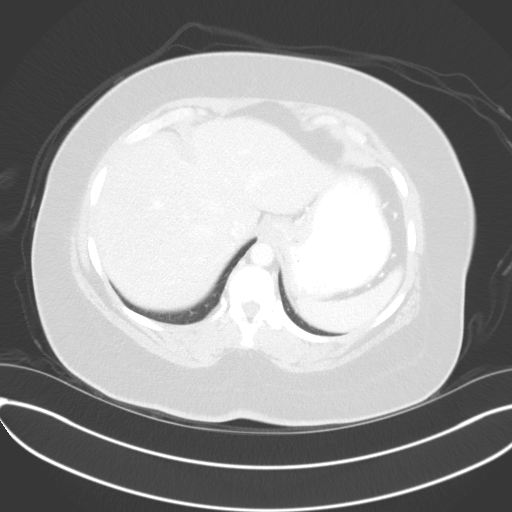
[im 86/119  lung]
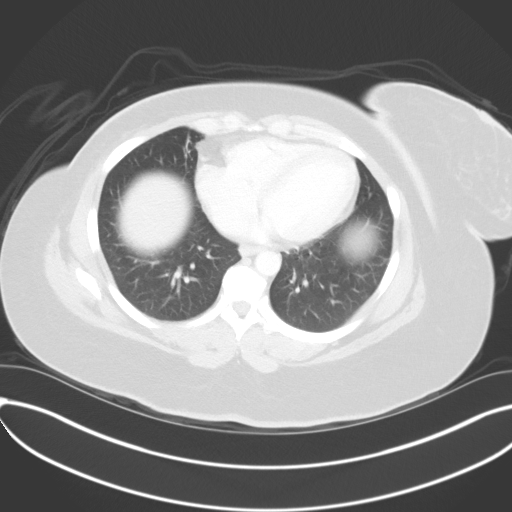
[im 97/119  mediastinal]
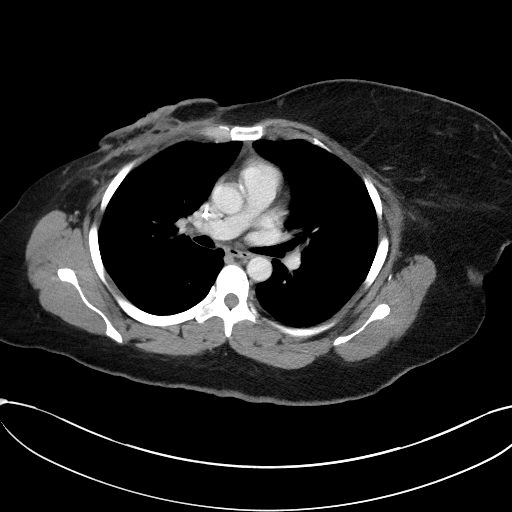
[im 97/119  lung]
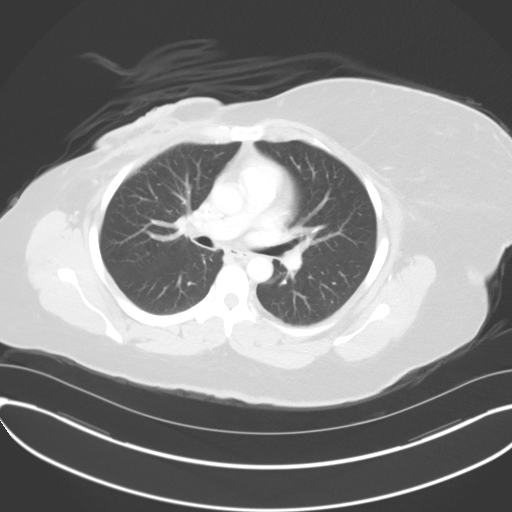
[im 108/119  lung]
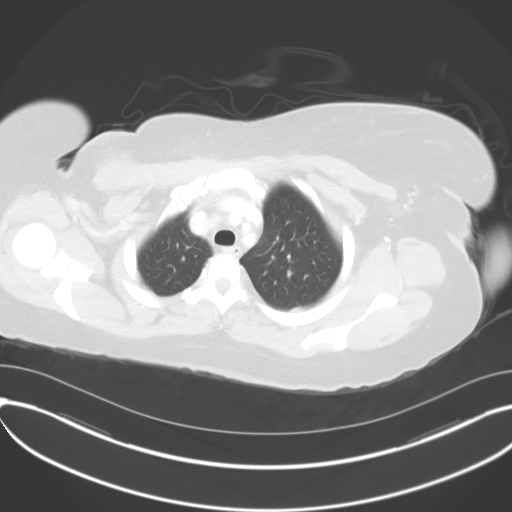

[Series 5: coronals · coronal · 0.82mm/px · 3 of 173 slices shown]
[im 35/173  lung]
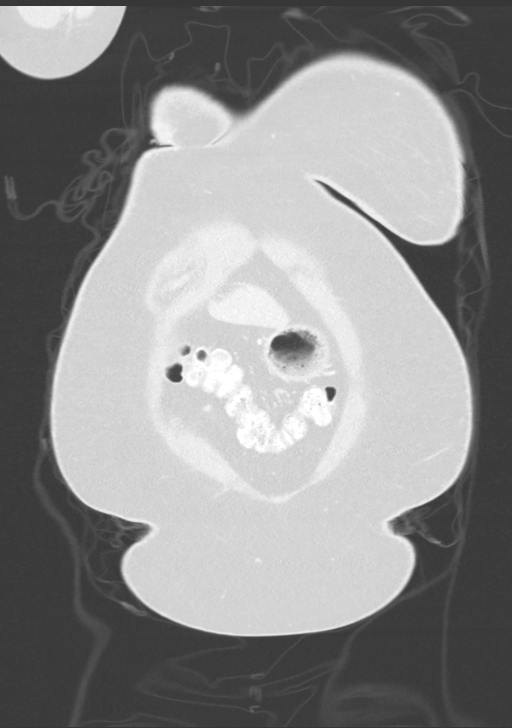
[im 69/173  lung]
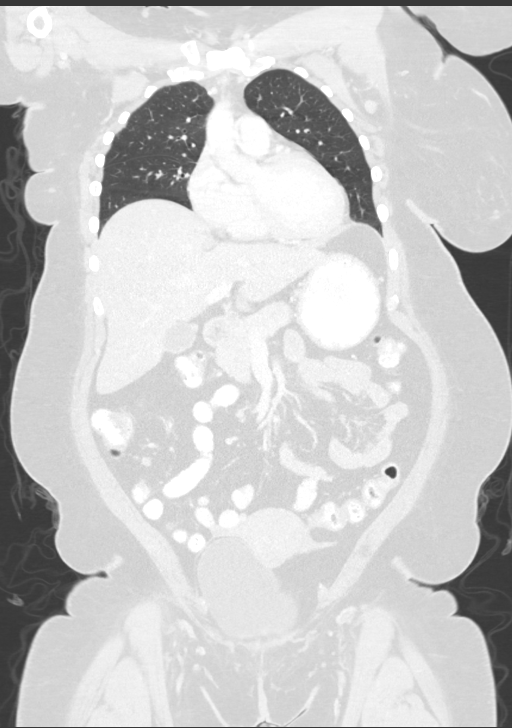
[im 104/173  lung]
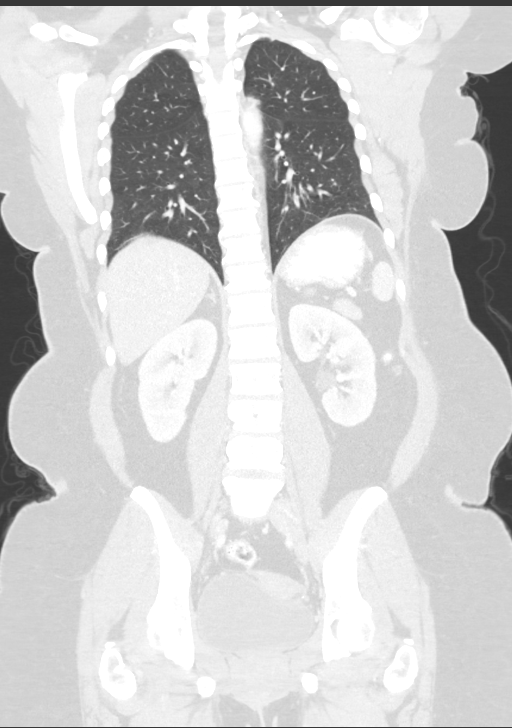

[13 of 36 positions shown; findings below may reference images not displayed]

FINDINGS: CT CHEST FINDINGS

Cardiovascular: Left chest port catheter. Normal heart size. No
pericardial effusion.

Mediastinum/Nodes: No change in a right axillary lymph node or soft
tissue nodule measuring 1.2 x 0.7 cm (series 2, image 21). Interval
decrease in size of an enlarged left axillary lymph node measuring
2.5 x 1.2 cm, previously 3.0 x 1.6 cm when measured similarly
(series 2, image 15). Interval decrease in size of a right internal
mammary lymph node, measuring approximately 0.6 x 0.6 cm, previously
1.5 x 1.1 cm (series 2, image 15). Thyroid gland, trachea, and
esophagus demonstrate no significant findings.

Lungs/Pleura: Lungs are clear. No pleural effusion or pneumothorax.

Musculoskeletal: Status post right mastectomy. Redemonstrated
spiculated appearing soft tissue mass of the right pectoralis major
muscle margin, difficult to accurately measure although
approximately 3.0 x 2.5 cm, unchanged compared to prior examination
(series 2, image 13).

CT ABDOMEN PELVIS FINDINGS

Hepatobiliary: No solid liver abnormality is seen. Hepatic
steatosis. No gallstones, gallbladder wall thickening, or biliary
dilatation.

Pancreas: Unremarkable. No pancreatic ductal dilatation or
surrounding inflammatory changes.

Spleen: Normal in size without significant abnormality.

Adrenals/Urinary Tract: Adrenal glands are unremarkable. Kidneys are
normal, without renal calculi, solid lesion, or hydronephrosis.
Bladder is unremarkable.

Stomach/Bowel: Stomach is within normal limits. Appendix appears
normal. No evidence of bowel wall thickening, distention, or
inflammatory changes.

Vascular/Lymphatic: No significant vascular findings are present. No
enlarged abdominal or pelvic lymph nodes.

Reproductive: No mass or other abnormality.

Other: No abdominal wall hernia or abnormality. No abdominopelvic
ascites.

Musculoskeletal: No acute or significant osseous findings.
IMPRESSION: 1. Redemonstrated spiculated appearing soft tissue mass of the right
pectoralis major muscle margin status post right mastectomy,
difficult to accurately measure although approximately 3.0 x 2.5 cm,
unchanged compared to prior examination (series 2, image 13).

2. No change in a right axillary lymph node or soft tissue nodule
measuring 1.2 x 0.7 cm (series 2, image 21).

3. Interval decrease in size of an enlarged left axillary lymph node
measuring 2.5 x 1.2 cm, previously 3.0 x 1.6 cm when measured
similarly (series 2, image 15). Interval decrease in size of a right
internal mammary lymph node, measuring approximately 0.6 x 0.6 cm,
previously 1.5 x 1.1 cm (series 2, image 15). Overall findings are
consistent with a mixed response to treatment. PET-CT may be helpful
to assess for response of primary mass to treatment.

4.  No evidence of the metastatic disease in the abdomen or pelvis.

## 2020-06-19 IMAGING — CT CT ABD-PELV W/ CM
2 of 5 series · 13 of 36 positions shown, 16 images · IV contrast (OMNIPAQUE)
Comparison: PET-CT, 06/19/2019, CT chest, 06/11/2019

CLINICAL DATA: Recurrent breast cancer, status post mastectomy

EXAM:
CT CHEST, ABDOMEN, AND PELVIS WITH CONTRAST
TECHNIQUE: Multidetector CT imaging of the chest, abdomen and pelvis was
performed following the standard protocol during bolus
administration of intravenous contrast.
CONTRAST:  100mL OMNIPAQUE IOHEXOL 300 MG/ML SOLN, additional oral
enteric contrast

[Series 2: cap with · axial · 0.80mm/px · z∈[-577,-92]mm · 10 of 119 slices shown, 13 images]
[im 11/119  mediastinal]
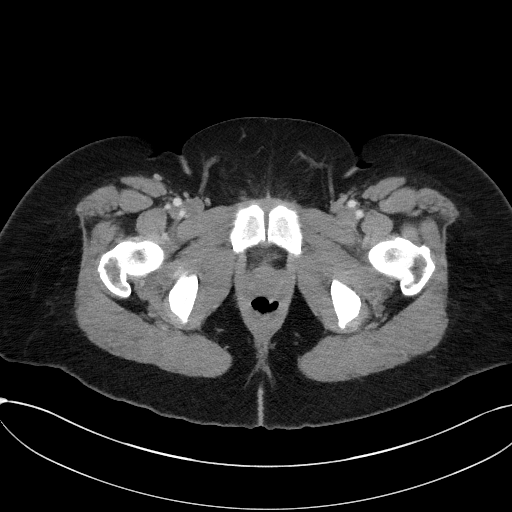
[im 11/119  lung]
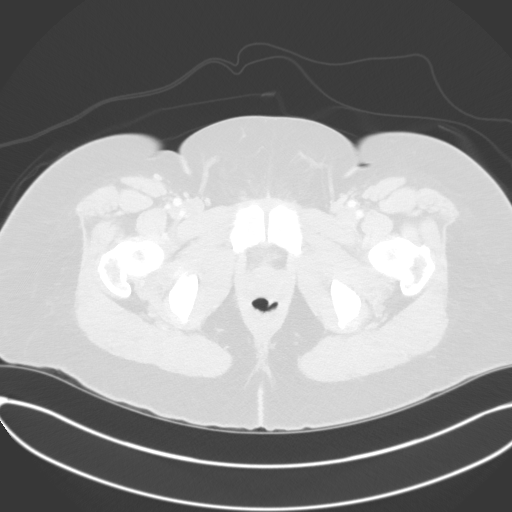
[im 22/119  lung]
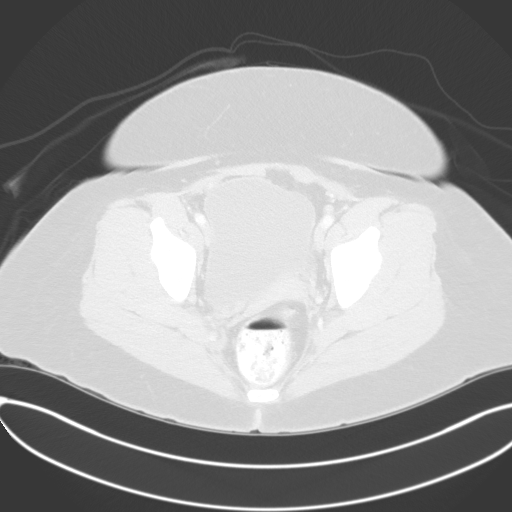
[im 33/119  lung]
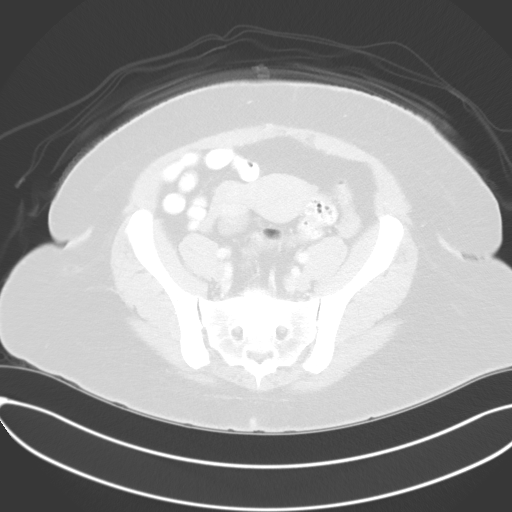
[im 43/119  lung]
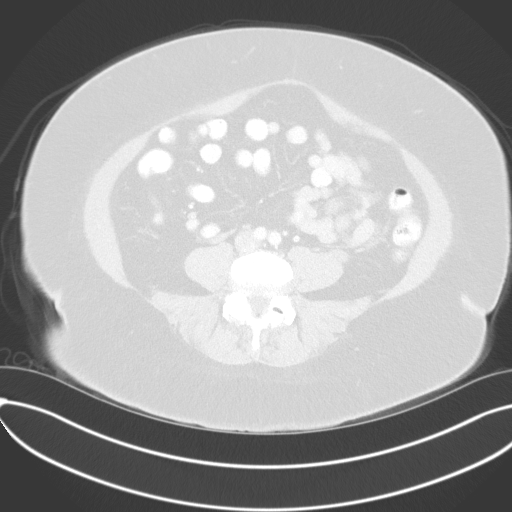
[im 54/119  mediastinal]
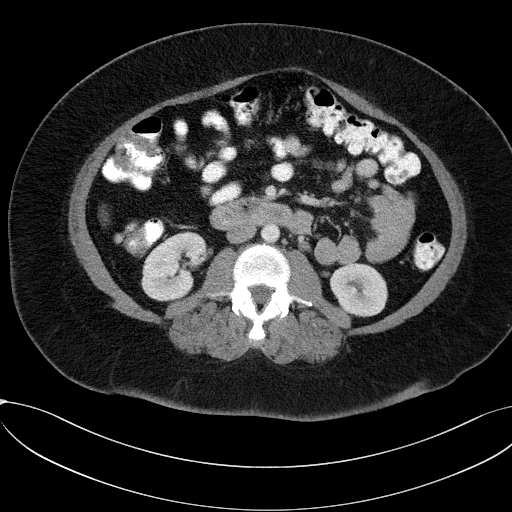
[im 54/119  lung]
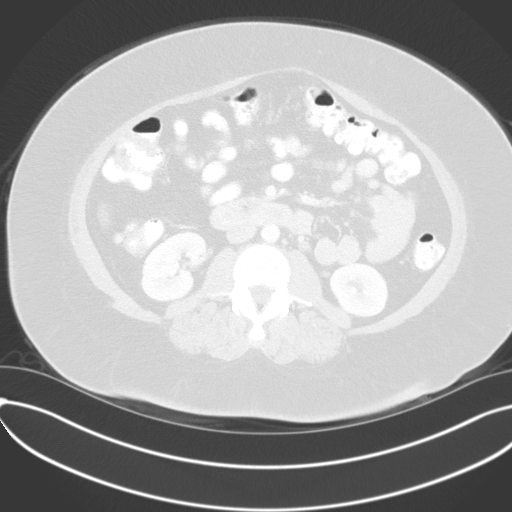
[im 65/119  lung]
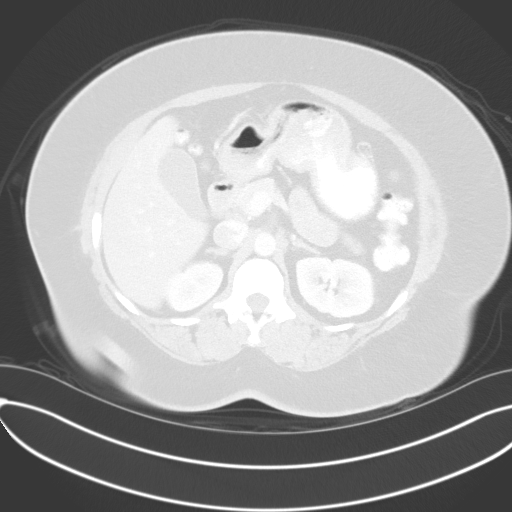
[im 76/119  lung]
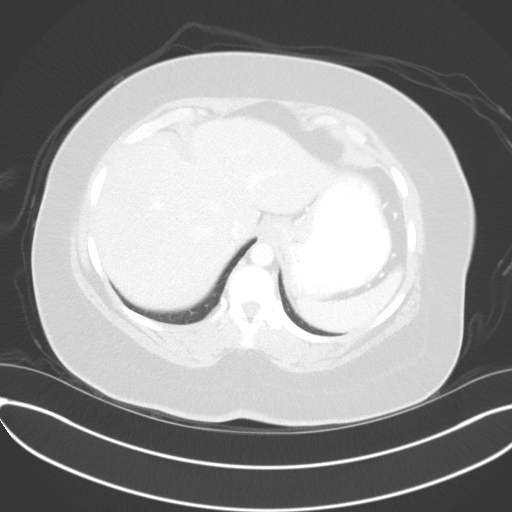
[im 86/119  lung]
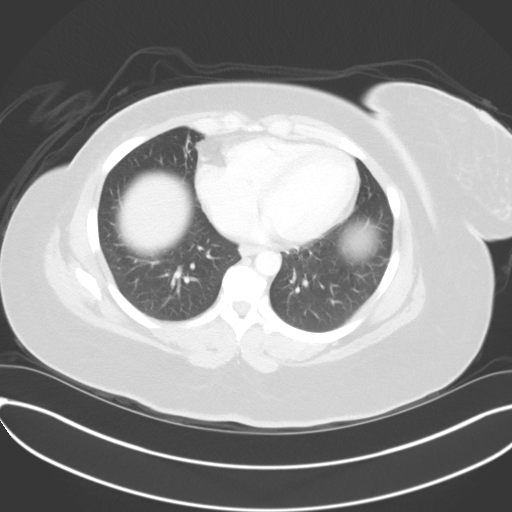
[im 97/119  mediastinal]
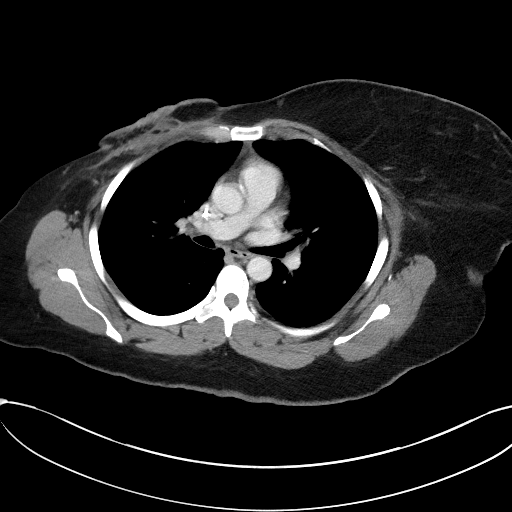
[im 97/119  lung]
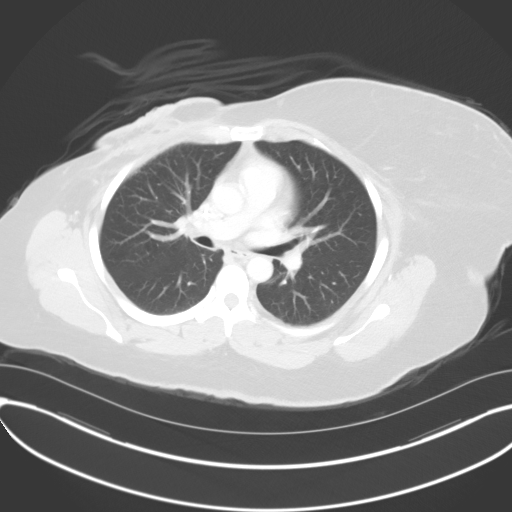
[im 108/119  lung]
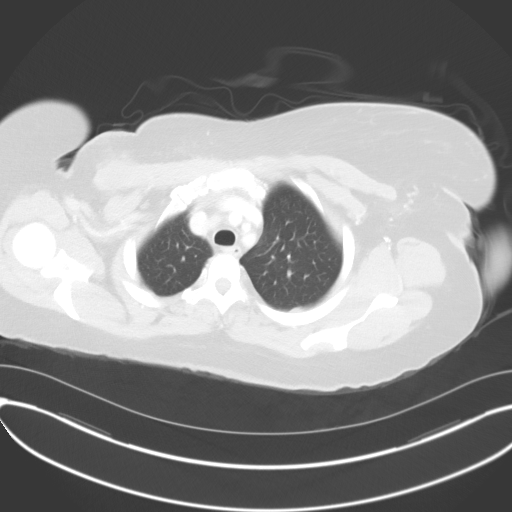

[Series 5: coronals · coronal · 0.82mm/px · 3 of 173 slices shown]
[im 35/173  lung]
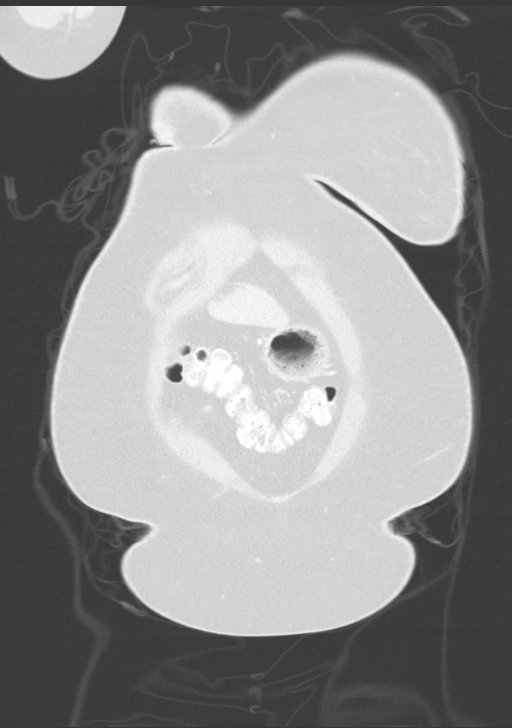
[im 69/173  lung]
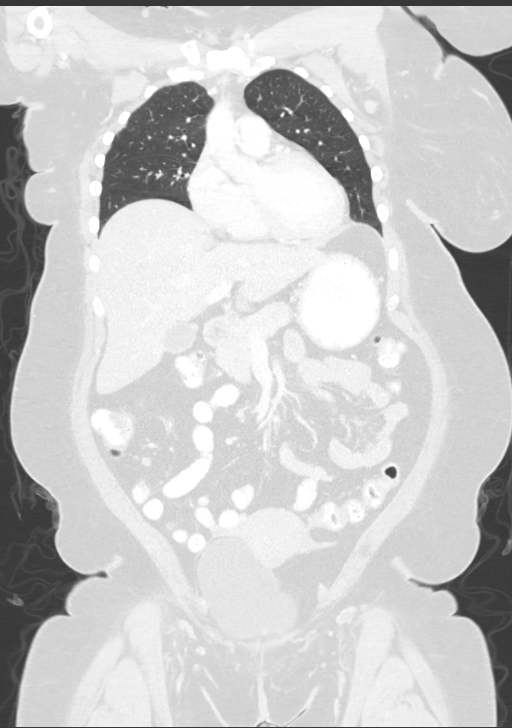
[im 104/173  lung]
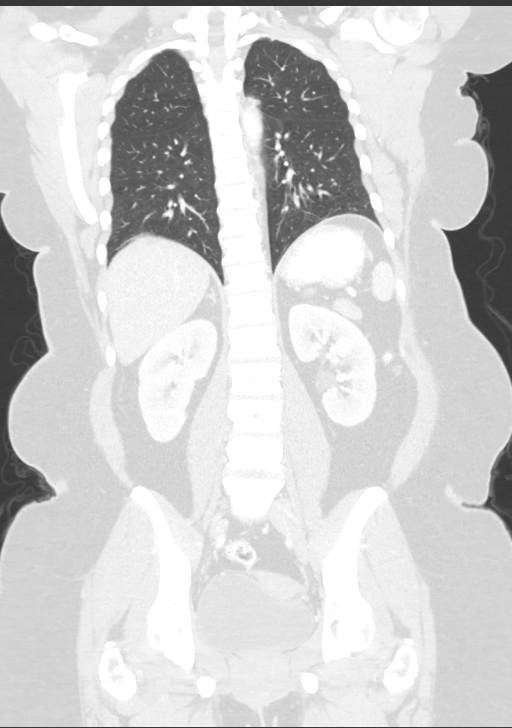

[13 of 36 positions shown; findings below may reference images not displayed]

FINDINGS: CT CHEST FINDINGS

Cardiovascular: Left chest port catheter. Normal heart size. No
pericardial effusion.

Mediastinum/Nodes: No change in a right axillary lymph node or soft
tissue nodule measuring 1.2 x 0.7 cm (series 2, image 21). Interval
decrease in size of an enlarged left axillary lymph node measuring
2.5 x 1.2 cm, previously 3.0 x 1.6 cm when measured similarly
(series 2, image 15). Interval decrease in size of a right internal
mammary lymph node, measuring approximately 0.6 x 0.6 cm, previously
1.5 x 1.1 cm (series 2, image 15). Thyroid gland, trachea, and
esophagus demonstrate no significant findings.

Lungs/Pleura: Lungs are clear. No pleural effusion or pneumothorax.

Musculoskeletal: Status post right mastectomy. Redemonstrated
spiculated appearing soft tissue mass of the right pectoralis major
muscle margin, difficult to accurately measure although
approximately 3.0 x 2.5 cm, unchanged compared to prior examination
(series 2, image 13).

CT ABDOMEN PELVIS FINDINGS

Hepatobiliary: No solid liver abnormality is seen. Hepatic
steatosis. No gallstones, gallbladder wall thickening, or biliary
dilatation.

Pancreas: Unremarkable. No pancreatic ductal dilatation or
surrounding inflammatory changes.

Spleen: Normal in size without significant abnormality.

Adrenals/Urinary Tract: Adrenal glands are unremarkable. Kidneys are
normal, without renal calculi, solid lesion, or hydronephrosis.
Bladder is unremarkable.

Stomach/Bowel: Stomach is within normal limits. Appendix appears
normal. No evidence of bowel wall thickening, distention, or
inflammatory changes.

Vascular/Lymphatic: No significant vascular findings are present. No
enlarged abdominal or pelvic lymph nodes.

Reproductive: No mass or other abnormality.

Other: No abdominal wall hernia or abnormality. No abdominopelvic
ascites.

Musculoskeletal: No acute or significant osseous findings.
IMPRESSION: 1. Redemonstrated spiculated appearing soft tissue mass of the right
pectoralis major muscle margin status post right mastectomy,
difficult to accurately measure although approximately 3.0 x 2.5 cm,
unchanged compared to prior examination (series 2, image 13).

2. No change in a right axillary lymph node or soft tissue nodule
measuring 1.2 x 0.7 cm (series 2, image 21).

3. Interval decrease in size of an enlarged left axillary lymph node
measuring 2.5 x 1.2 cm, previously 3.0 x 1.6 cm when measured
similarly (series 2, image 15). Interval decrease in size of a right
internal mammary lymph node, measuring approximately 0.6 x 0.6 cm,
previously 1.5 x 1.1 cm (series 2, image 15). Overall findings are
consistent with a mixed response to treatment. PET-CT may be helpful
to assess for response of primary mass to treatment.

4.  No evidence of the metastatic disease in the abdomen or pelvis.

## 2020-06-22 ENCOUNTER — Other Ambulatory Visit: Payer: Self-pay | Admitting: *Deleted

## 2020-06-22 DIAGNOSIS — Z171 Estrogen receptor negative status [ER-]: Secondary | ICD-10-CM

## 2020-06-22 DIAGNOSIS — C50919 Malignant neoplasm of unspecified site of unspecified female breast: Secondary | ICD-10-CM

## 2020-06-22 DIAGNOSIS — R1319 Other dysphagia: Secondary | ICD-10-CM

## 2020-06-22 DIAGNOSIS — C7951 Secondary malignant neoplasm of bone: Secondary | ICD-10-CM

## 2020-06-25 ENCOUNTER — Other Ambulatory Visit: Payer: Self-pay | Admitting: *Deleted

## 2020-06-25 DIAGNOSIS — C50411 Malignant neoplasm of upper-outer quadrant of right female breast: Secondary | ICD-10-CM

## 2020-06-26 ENCOUNTER — Other Ambulatory Visit: Payer: Self-pay

## 2020-06-26 ENCOUNTER — Ambulatory Visit (HOSPITAL_COMMUNITY)
Admission: RE | Admit: 2020-06-26 | Discharge: 2020-06-26 | Disposition: A | Discharge status: Home / Self Care | Payer: Medicaid Other | Source: Ambulatory Visit | Attending: Oncology | Admitting: Oncology

## 2020-06-26 ENCOUNTER — Encounter (HOSPITAL_COMMUNITY): Payer: Self-pay

## 2020-06-26 DIAGNOSIS — R1319 Other dysphagia: Secondary | ICD-10-CM | POA: Insufficient documentation

## 2020-06-26 DIAGNOSIS — C50919 Malignant neoplasm of unspecified site of unspecified female breast: Secondary | ICD-10-CM | POA: Diagnosis not present

## 2020-06-26 DIAGNOSIS — C7951 Secondary malignant neoplasm of bone: Secondary | ICD-10-CM | POA: Diagnosis present

## 2020-06-26 DIAGNOSIS — Z171 Estrogen receptor negative status [ER-]: Secondary | ICD-10-CM | POA: Diagnosis present

## 2020-06-26 DIAGNOSIS — C50411 Malignant neoplasm of upper-outer quadrant of right female breast: Secondary | ICD-10-CM | POA: Insufficient documentation

## 2020-06-26 DIAGNOSIS — R1310 Dysphagia, unspecified: Secondary | ICD-10-CM | POA: Diagnosis not present

## 2020-06-26 MED ORDER — SODIUM CHLORIDE (PF) 0.9 % IJ SOLN
INTRAMUSCULAR | Status: AC
  Filled 2020-06-26: qty 50

## 2020-06-26 MED ORDER — IOHEXOL 300 MG/ML  SOLN
75.0000 mL | Freq: Once | INTRAMUSCULAR | Status: AC | PRN
  Administered 2020-06-26: 75 mL via INTRAVENOUS

## 2020-06-29 ENCOUNTER — Telehealth: Payer: Self-pay | Admitting: Oncology

## 2020-06-29 ENCOUNTER — Other Ambulatory Visit: Payer: Self-pay | Admitting: Oncology

## 2020-06-29 NOTE — Progress Notes (Signed)
[name]  Telephone:([phone]) [phone] [phone]   ID: Unknown Rebecca [name] DOB: 03-13-1976  MR#: [id number]  QMV#:[id number]  Patient Care Team: Dorena Dew, FNP as PCP - General (Family Medicine) Alphonsa Overall, MD as Consulting Physician (General Surgery) Jerica Creegan, [name], MD as Consulting Physician (Oncology) Gery Pray, MD as Consulting Physician (Radiation Oncology) Delice Bison, Charlestine Massed, NP as Nurse Practitioner (Hematology and Oncology) Alda Berthold, DO as Consulting Physician (Neurology) OTHER MD:  CHIEF COMPLAINT: triple negative stage IV breast cancer  CURRENT TREATMENT: to start [name]   INTERVAL HISTORY: Rebecca [name] was scheduled today for follow-up and treatment of her triple negative breast cancer.  However she did not show for her visit  Since her last visit, she underwent restaging neck and chest CT on 06/26/2020. Chest CT showed: possible slight interval increase of bulky left axillary adenopathy, associated with possible very slight increase of right internal mammary nodal enlargement; slight improvement to expansion of right pectoralis musculature, and diminished amount of soft tissue in right chest wall though there is now frank ulceration; hepatic steatosis with lobular hepatic contours, raising concern for liver disease/early cirrhosis.  Neck CT showed no evidence of metastatic disease.  She was changed to Doxil, given on day 1 of a 21 day cycle, starting on 04/07/2020.  She has received 4 cycles and would receive the fifth cycle 06/30/2020 if we decide to continue on this medication.  Her most recent echocardiogram on 04/02/2020 showed a stable EF of 50-55%. She is due for repeat this month.   REVIEW OF SYSTEMS: Embry     BREAST CANCER HISTORY: From the original intake note:  [name] herself noted a change in her right breast sometime around September or October 2016. She did not bring it to intermediate medical attention, but on  01/13/2016 she established herself in Dr. Smith Robert' service and she was set up for bilateral diagnostic mammography with tomosynthesis and bilateral ultrasonography at the [facility] 01/19/2016. The breast density was category B. In the upper outer quadrant of the right breast there was a spiculated mass measuring 2.8 cm. On physical exam this was palpable. Targeted ultrasonography confirmed an irregular hypoechoic mass in the right breast 11:30 o'clock position measuring 2.6 cm maximally. Ultrasound of the right axilla showed a morphologically abnormal lymph node.  In the left breast there were some tubular densities behind the areola which by ultrasonography showed benign ductal ectasia.  On 01/28/2016 [name] underwent biopsy of the right breast mass and abnormal right axillary lymph node. The pathology from this procedure (S [id number]) showed the lymph node to be benign. In the breast however there was an invasive ductal carcinoma, grade 3, which was estrogen and progesterone receptor negative. The proliferation marker was 70%. HER-2 was not amplified with a signals ratio of 1.32. The number per cell was 2.05.  The patient's subsequent history is as detailed below   PAST MEDICAL HISTORY: Past Medical History:  Diagnosis Date  . Anxiety   . Breast cancer ([place])   . Depression   . GERD (gastroesophageal reflux disease)   . History of radiation therapy 11/15/16-01/12/17   right chest wall and axilla treated to 45 Gy in 25 fractions, boosted and additional 14 Gy in 8 fractions  . Hypertension    diet controlled  . Obesity (BMI 35.0-39.9 without comorbidity)   . Pneumonia    as a child  . Seasonal allergies   . Sickle cell trait ([place])   . Termination of pregnancy (  fetus) 04/02/16    PAST SURGICAL HISTORY: Past Surgical History:  Procedure Laterality Date  . CESAREAN SECTION     2004 and 2007  . MASTECTOMY W/ SENTINEL NODE BIOPSY Right 09/06/2016   Procedure: RIGHT BREAST MASTECTOMY WITH  RIGHT AXILLARY SENTINEL LYMPH NODE BIOPSY;  Surgeon: Alphonsa Overall, MD;  Location: [facility];  Service: General;  Laterality: Right;  . PORT-A-CATH REMOVAL Left 09/06/2016   Procedure: REMOVAL PORT-A-CATH;  Surgeon: Alphonsa Overall, MD;  Location: [facility];  Service: General;  Laterality: Left;  . PORTACATH PLACEMENT    . PORTACATH PLACEMENT N/A 07/09/2019   Procedure: INSERTION PORT-A-CATH WITH ULTRASOUND;  Surgeon: Alphonsa Overall, MD;  Location[facility];  Service: General;  Laterality: N/A;    FAMILY HISTORY Family History  Problem Relation Age of Onset  . Hypertension Mother   . Cancer Mother        dx "intestinal cancer" in her 18s; +surgery  . Other Mother        hysterectomy at young age for unspecified cause  . Heart Problems Mother   . Breast cancer Cousin        maternal 1st cousin dx female breast cancer at 81-46y  . Cancer Father   . Hypertension Father   . Heart Problems Maternal Aunt   . Diabetes Maternal Aunt   . Breast cancer Maternal Uncle        dx 64-65  . Heart Problems Maternal Uncle   . Breast cancer Maternal Grandmother 59  . Throat cancer Maternal Grandfather        d. 78s; smoker  . Sickle cell anemia Paternal Aunt   . Congestive Heart Failure Maternal Aunt   . Multiple sclerosis Cousin   . Cancer Other        maternal great uncle (MGM's brother); cancer removed from his side  . Heart attack Paternal Aunt        d. early 27s  The patient has very little information about her father. Her mother is currently 56 years old. She had a history of cervical cancer at age 28. The patient had 2 brothers, no sisters. The maternal grandfather had throat cancer. A maternal uncle was diagnosed with breast cancer as well as prostate cancer at the age of 31. 2 maternal cousins, one of them female, had breast cancer as well.   GYNECOLOGIC HISTORY:  No LMP recorded. Menarche age 42, first live birth age 67. The patient is GX P4. She was still having  regular periods at the time of diagnosis. She took oral contraceptives in the 1990s with no side effects.--.  Stopped with chemotherapy and have not resumed as of May 2019   SOCIAL HISTORY:  (Updated 10/2018) She works as a Market researcher. The patient's significant other [name] works at [employer].  At home with the patient are her 3 children [name], [place] and [place]. There are age 58, 37, and 41 as of November 2019.  2 of them are disabled or have significant health problems, one with sickle cell disease, the other with autism and developmental delay.  The patient's son [name], currently 53 years old, lives in [place].    ADVANCED DIRECTIVES: Not in place   HEALTH MAINTENANCE: Social History   Tobacco Use  . Smoking status: Former Smoker    Packs/day: 1.00    Years: 20.00    Pack years: 20.00    Types: Cigarettes  Quit date: 04/01/2018    Years since quitting: 2.2  . Smokeless tobacco: Never Used  . Tobacco comment: Patient has quit smoking x 1 year now  Vaping Use  . Vaping Use: Never used  Substance Use Topics  . Alcohol use: Yes    Comment: occ  . Drug use: No    Colonoscopy:  PAP:  Bone density:  Lipid panel:  No Known Allergies  Current Outpatient Medications on File Prior to Visit  Medication Sig Dispense Refill  . calcium carbonate (TUMS - DOSED IN MG ELEMENTAL CALCIUM) 500 MG chewable tablet Chew 1 tablet by mouth daily as needed for indigestion or heartburn.    . doxycycline (VIBRA-TABS) 100 MG tablet Take 1 tablet (100 mg total) by mouth daily. 30 tablet 4  . lidocaine-prilocaine (EMLA) cream Apply to affected area once 30 g 3  . LORazepam (ATIVAN) 0.5 MG tablet Take 1 tablet (0.5 mg total) by mouth daily as needed for anxiety. 30 tablet 0  . prochlorperazine (COMPAZINE) 10 MG tablet Take 1 tablet (10 mg total) by mouth every 6 (six) hours as needed (Nausea or vomiting). 30 tablet 1  . traMADol (ULTRAM)  50 MG tablet Take 1 tablet (50 mg total) by mouth every 6 (six) hours as needed. 30 tablet 0  . valACYclovir (VALTREX) 1000 MG tablet Take 1 tablet (1,000 mg total) by mouth daily. 90 tablet 1   No current facility-administered medications on file prior to visit.    OBJECTIVE: African-American woman  There were no vitals filed for this visit. Wt Readings from Last 3 Encounters:  06/09/20 203 lb (92.1 kg)  05/19/20 205 lb 8 oz (93.2 kg)  05/06/20 202 lb 12.8 oz (92 kg)   There is no height or weight on file to calculate BMI.    ECOG FS:   Right breast 05/06/2020    Right chest wall area 11/22/2019, which is the new baseline   LAB RESULTS: CMP Latest Ref Rng & Units 06/09/2020 05/19/2020 05/06/2020  Glucose 70 - 99 mg/dL 93 112(H) 102(H)  BUN 6 - 20 mg/dL _0 Creatinine 0.44 - 1.00 mg/dL 0.96 0.79 0.91  Sodium 135 - 145 mmol/L 142 144 141  Potassium 3.5 - 5.1 mmol/L 3.3(L) 3.6 3.6  Chloride 98 - 111 mmol/L 104 107 106  CO2 22 - 32 mmol/L _1 Calcium 8.9 - 10.3 mg/dL 9.6 9.1 9.2  Total Protein 6.5 - 8.1 g/dL 7.7 7.4 7.6  Total Bilirubin 0.3 - 1.2 mg/dL 0.3 0.2(L) 0.2(L)  Alkaline Phos 38 - 126 U/L 66 71 80  AST 15 - 41 U/L 17 13(L) 13(L)  ALT 0 - 44 U/L _2 CBC    Component Value Date/Time   WBC 6.2 06/09/2020 1339   RBC 3.03 (L) 06/09/2020 1339   RBC 3.02 (L) 06/09/2020 1339   HGB 9.6 (L) 06/09/2020 1339   HGB 6.3 (LL) 11/08/2019 1350   HGB 10.4 (L) 10/04/2016 1154   HCT 28.8 (L) 06/09/2020 1339   HCT 31.3 (L) 10/04/2016 1154   PLT 190 06/09/2020 1339   PLT 10 (L) 11/08/2019 1350   PLT 320 10/04/2016 1154   MCV 95.0 06/09/2020 1339   MCV 95.7 10/04/2016 1154   MCH 31.7 06/09/2020 1339   MCHC 33.3 06/09/2020 1339   RDW 16.1 (H) 06/09/2020 1339   RDW 16.3 (H) 10/04/2016 1154   LYMPHSABS 1.4 06/09/2020 1339   LYMPHSABS 1.7 10/04/2016 1154  MONOABS 0.9 06/09/2020 1339   MONOABS 0.5 10/04/2016 1154   EOSABS 0.1 06/09/2020 1339   EOSABS 0.3  10/04/2016 1154   BASOSABS 0.0 06/09/2020 1339   BASOSABS 0.0 10/04/2016 1154    STUDIES: CT Soft Tissue Neck W Contrast  Result Date: 06/27/2020 CLINICAL DATA:  Metastatic breast cancer with difficulty swallowing. EXAM: CT NECK WITH CONTRAST TECHNIQUE: Multidetector CT imaging of the neck was performed using the standard protocol following the bolus administration of intravenous contrast. CONTRAST:  54m OMNIPAQUE IOHEXOL 300 MG/ML  SOLN COMPARISON:  None. FINDINGS: Pharynx and larynx: No evidence of mass or swelling. No mass effect on the airway. Salivary glands: No inflammation, mass, or stone. Thyroid: Normal. Lymph nodes: None enlarged in the neck. Vascular: Partially covered left subclavian porta catheter. Limited intracranial: Negative Visualized orbits: Partial coverage is negative Mastoids and visualized paranasal sinuses: Clear. Skeleton: No evidence of metastatic disease. Cervical endplate spurring. Upper chest: Reported on chest CT from the same day IMPRESSION: No evidence of metastatic disease to the neck. No explanation for difficulty swallowing. Electronically Signed   By: JMonte FantasiaM.D.   On: 06/27/2020 16:26   CT Chest W Contrast  Result Date: 06/26/2020 CLINICAL DATA:  Breast cancer staging, history of mastectomy with recurrence, status post chemo radiotherapy. EXAM: CT CHEST WITH CONTRAST TECHNIQUE: Multidetector CT imaging of the chest was performed during intravenous contrast administration. CONTRAST:  750mOMNIPAQUE IOHEXOL 300 MG/ML  SOLN COMPARISON:  CT chest 04/02/2020 FINDINGS: Cardiovascular: Normal caliber thoracic aorta. Central pulmonary vasculature on venous phase assessment is unremarkable. Heart size is normal. No pericardial effusion. LEFT-sided Port-A-Cath terminating at the caval to atrial junction. Mediastinum/Nodes: Bulky LEFT axillary adenopathy with perhaps slight interval increase the interval (16, series 2) 16 mm LEFT subpectoral lymph node previously  approximately 14-15 mm. Short axis dimension of bulky LEFT axillary adenopathy (image 38, series 2) 2.4 cm previously, now 2.7 cm. The area is limited with respect overall assessment however given incomplete imaging of this location. Another high LEFT axillary lymph node measuring 13 mm (image 21, series 2) previously approximately 9 mm short axis. RIGHT axillary nodal enlargement (image 36, series 2) 1 cm short axis previously 11 mm. Anterior mediastinal soft tissue along the internal mammary chain (image 28, series 2) 11 mm, previously 7 mm. Expansion of RIGHT pectoralis major and soft tissue along its inferior extent may be slightly diminished, difficult to measure given infiltrative nature. Ulcerated area, at the site of previous chest wall soft tissue with less soft tissue and now frank ulceration in the area along the inferior margin of the pectoralis and RIGHT anterior chest wall. Plaque-like area of soft tissue thickening previously measured up to 13 mm, above the area of ulceration where there is still dermal thickening is approximately 5 mm on today's exam. No hilar adenopathy.  No mediastinal adenopathy. Lungs/Pleura: No consolidation. No pleural effusion. Pleural thickening along the RIGHT anterior chest wall. Scarring in the RIGHT middle lobe. No suspicious mass or nodule in the chest. Upper Abdomen: Hepatic steatosis with lobular hepatic contours. Limited assessment of the liver without focal, suspicious lesion. No pericholecystic stranding. Gallbladder incompletely imaged. Adrenal glands are unremarkable with respect to imaged portions. No acute upper abdominal process. Musculoskeletal: Chest wall findings and axillary findings described in detail above. No destructive bone finding. No acute bone process. IMPRESSION: 1. Bulky LEFT axillary adenopathy with perhaps slight interval increase the interval, also associated with RIGHT internal mammary nodal enlargement that may show very slight increase when  compared to the previous study, attention on follow-up. 2. Expansion of RIGHT pectoralis musculature may be slightly improved and the amount of soft tissue in the area over the RIGHT chest wall has diminished though there is now frank ulceration at the site of soft tissue thickening seen on the previous exam overlying inferior aspect of the pectoralis muscle. 3. Hepatic steatosis with lobular hepatic contours. Findings raise the question of liver disease/early cirrhosis. 4. Aortic atherosclerosis. Aortic Atherosclerosis (ICD10-I70.0). Electronically Signed   By: Zetta Bills M.D.   On: 06/26/2020 12:19     RESEARCH: Referred to PREVENT study, but was pregnant at the time; referred to [facility] 11202, but the biopsied lymph node was benign; referred to weight loss study but declined; referred to [facility] 12/09/2000, but the timing of radiation and 8 was greater than 60 days past the date of diagnosis; referred to health disparity study, but declined; referred to [id number] adjuvant therapy study, but the patient received Xeloda with radiation and therefore was ineligible   ASSESSMENT: 44 y.o. [place] woman status post right breast upper-outer quadrant biopsy 01/28/2016 for a clinical T2 N0 invasive ductal carcinoma, grade 3, triple negative, with an MIB-1 of 70%.  (a) suspicious right axillary lymph node biopsied 01/28/2016 was benign  (1) neoadjuvant chemotherapy: doxorubicin and cyclophosphamide in dose dense fashion 4 started 04/14/16, completed 05/26/2016, followed by paclitaxel and carboplatin weekly 12, Started 06/09/2016  (a) taxol discontinued after 7 doses because of neuropathy, last dose 07/21/2016  (2) genetics testing October 20, 2016 through the 32-gene Comprehensive Cancer Panel offered by [facility] Junius Roads, MD) (with MSH2 Exons 1-7 Inversion Analysis) found no deleterious mutations or VUSS  In APC, ATM, AXIN2, BARD1, BMPR1A, BRCA1, BRCA2, BRIP1, CDH1, CDK4, CDKN2A,  CHEK2, EPCAM, FANCC, MLH1, MSH2, MSH6, MUTYH, NBN, PALB2, PMS2, POLD1, POLE, PTEN, RAD51C, RAD51D, SCG5/GREM1, SMAD4, STK11, TP53, VHL, and XRCC2.    (3) right mastectomy and sentinel lymph node sampling 09/06/2016 showed a residual ypT1c ypN0, invasive ductal carcinoma, grade 3, with negative margins. Repeat prognostic panel again triple negative   (4) adjuvant radiation with capecitabine/Xeloda sensitization 11/15/16 - 01/12/17 Site/dose:   Right Chest Wall and axilla (4 field) treated to 45 Gy in 25 fractions, and then Boosted an additional 14.4 Gy in 8 fractions.  (5) tobacco abuse disorder: The patient quit smoking 04/04/2018  METASTATIC DISEASE:  July 2020: chest wall, bones, nodes (6) nonspecific changes noted on chest CT 06/11/2019 were clarified by PET scan 06/19/2019 showing hypermetabolic disease in the right anterior chest wall, right internal mammary nodes, right and left axillary nodes, but no metastatic disease in the neck, lungs, abdomen or pelvis.  Bone marrow uptake suggests bony metastatic disease.  (a) CARIS requested obtained from 06/28/2019 sample confirmed a triple negativity, the tumor also was negative for the androgen receptor, was MSI stable and mismatch repair status proficient, with a low mutational burden.  BRCA 1 and 2 were negative and PD-L1 was negative.  PI K3 showed a variant of uncertain significance.  However the tumor was genomic LOH high  (b) CA-27-29 is informative: was 118.3 on 06/04/2019  (7) zoledronate started 07/17/2019--on hold currently due to dental issues, and until 6 weeks at least after dental work  (8) carboplatin/ gemcitabine days 1 and 8 Q21 day cycle started 07/10/2019, last dose 10/30/2019  (a) restaging studies 09/2019 showed no progression  (b) restaging studies after 6 cycles showed mild disease progression  (9) cyclophosphamide, methotrexate, fluorouracil chemotherapy started 11/13/2019, repeated every 21 days.  (  a) discontinued after  cycle 3 (12/31/2019): No evidence of response  (10) started capecitabine 01/21/2020, given 1 week on and 1 week off at standard doses (1500 mg twice daily)   (a) Discontinued after almost three months of treatment (last on 04/06/2020)  (b) Disease progression on 4/27 CT scan showing increasing right chest wall disease, left breast lesions, and ? Liver involvement.  (c) MRI liver 04/14/2020 shows no liver invovlement  (11) Started liposomal doxorubicin/Doxil given on day 1 of a 21 day cycle on 04/07/2020  (a) echo on 04/02/2020 shows EF of 50-55%, repeat in 06/2020  (b) chest CT scan after 4 cycles of Doxil shows some evidence of progression in left axillary and right internal mammary adenopathy  (c) Doxil  after 06/09/2020 dose  (12) to start sacituzumab govitecan/ Trodelvy 07/07/2020    PLAN: Aida did not show for her appointment 06/30/2020.  We are trying to contact her and let her know that we are changing her treatment.  She will start hopefully [name] next week.  She will meet with [place] to discuss side effects toxicities and complications prior to starting.  [name]. Jourdyn Ferrin, MD 07/01/20 12:49 PM Medical Oncology and Hematology [facility] [place], [place] [address] Tel. [phone]    Fax. [phone]   I, Wilburn Mylar, am acting as scribe for Dr. [name]. Mellissa Conley.  I, Lurline Del MD, have reviewed the above documentation for accuracy and completeness, and I agree with the above.   *Total Encounter Time as defined by the Centers for Medicare and Medicaid Services includes, in addition to the face-to-face time of a patient visit (documented in the note above) non-face-to-face time: obtaining and reviewing outside history, ordering and reviewing medications, tests or procedures, care coordination (communications with other health care professionals or caregivers) and documentation in the medical record.

## 2020-06-29 NOTE — Telephone Encounter (Signed)
Scheduled appts per 7/26 sch msg. Called patient. No answer, not able to leave msg

## 2020-06-30 ENCOUNTER — Inpatient Hospital Stay (HOSPITAL_BASED_OUTPATIENT_CLINIC_OR_DEPARTMENT_OTHER): Payer: Medicaid Other | Admitting: Oncology

## 2020-06-30 ENCOUNTER — Inpatient Hospital Stay: Payer: Medicaid Other

## 2020-06-30 DIAGNOSIS — C50919 Malignant neoplasm of unspecified site of unspecified female breast: Secondary | ICD-10-CM

## 2020-06-30 DIAGNOSIS — Z7189 Other specified counseling: Secondary | ICD-10-CM

## 2020-06-30 DIAGNOSIS — Z171 Estrogen receptor negative status [ER-]: Secondary | ICD-10-CM

## 2020-06-30 DIAGNOSIS — C7951 Secondary malignant neoplasm of bone: Secondary | ICD-10-CM

## 2020-06-30 DIAGNOSIS — C50411 Malignant neoplasm of upper-outer quadrant of right female breast: Secondary | ICD-10-CM

## 2020-07-01 ENCOUNTER — Telehealth: Payer: Self-pay | Admitting: *Deleted

## 2020-07-01 ENCOUNTER — Inpatient Hospital Stay: Payer: Medicaid Other

## 2020-07-01 MED ORDER — LIDOCAINE-PRILOCAINE 2.5-2.5 % EX CREA: TOPICAL_CREAM | CUTANEOUS | 3 refills | Status: DC

## 2020-07-01 MED ORDER — DEXAMETHASONE 4 MG PO TABS: ORAL_TABLET | ORAL | 1 refills | Status: DC

## 2020-07-01 MED ORDER — PROCHLORPERAZINE MALEATE 10 MG PO TABS: 10.0000 mg | ORAL_TABLET | Freq: Four times a day (QID) | ORAL | 1 refills | Status: DC | PRN

## 2020-07-01 MED ORDER — LOPERAMIDE HCL 2 MG PO TABS: ORAL_TABLET | ORAL | 1 refills | Status: DC

## 2020-07-01 NOTE — Telephone Encounter (Signed)
This RN attempted to call pt per her VM requesting results of recent scans.  Note pt was scheduled for visit yesterday and was a no show ( likely schedulers unable to reach pt )- this RN attempted to call pt back and obtained her VM.  Message left informing pt to call this RN for discussion of scans and MD plan.  Note pt will not get treated today - needs to see MD.

## 2020-07-01 NOTE — Progress Notes (Signed)
DISCONTINUE ON PATHWAY REGIMEN - Breast     A cycle is every 28 days:     Liposomal doxorubicin   **Always confirm dose/schedule in your pharmacy ordering system**  REASON: Disease Progression PRIOR TREATMENT: [id number]: Liposomal Doxorubicin 50 mg/m2 q28 Days TREATMENT RESPONSE: Progressive Disease (PD)  START ON PATHWAY REGIMEN - Breast     A cycle is every 21 days:     Sacituzumab govitecan-hziy   **Always confirm dose/schedule in your pharmacy ordering system**  Patient Characteristics: Distant Metastases or Locoregional Recurrent Disease - Unresected or Locally Advanced Unresectable Disease Progressing after Neoadjuvant and Local Therapies, HER2 Negative/Unknown/Equivocal, ER Negative/Unknown, Chemotherapy, Third Line and Beyond, Prior  or Contraindicated Anthracycline and Prior or Contraindicated Eribulin Therapeutic Status: Distant Metastases ER Status: Negative (-) HER2 Status: Negative (-) PR Status: Negative (-) Therapy Approach Indicated: Standard Chemotherapy/Endocrine Therapy Line of Therapy: Third Line and Beyond Intent of Therapy: Non-Curative / Palliative Intent, Discussed with Patient

## 2020-07-06 NOTE — Progress Notes (Signed)
Pharmacist Chemotherapy Monitoring - Initial Assessment    Anticipated start date: 07/10/20  Regimen:  . Are orders appropriate based on the patient's diagnosis, regimen, and cycle? Yes . Does the plan date match the patient's scheduled date? Yes . Is the sequencing of drugs appropriate? Yes . Are the premedications appropriate for the patient's regimen? Yes . Prior Authorization for treatment is: Pending o If applicable, is the correct biosimilar selected based on the patient's insurance? not applicable  Organ Function and Labs: Marland Kitchen Are dose adjustments needed based on the patient's renal function, hepatic function, or hematologic function? No . Are appropriate labs ordered prior to the start of patient's treatment? Yes . Other organ system assessment, if indicated: women of childbearing potential: pregnancy status  . The following baseline labs, if indicated, have been ordered: N/A  Dose Assessment: . Are the drug doses appropriate? Yes . Are the following correct: o Drug concentrations Yes o IV fluid compatible with drug Yes o Administration routes Yes o Timing of therapy Yes . If applicable, does the patient have documented access for treatment and/or plans for port-a-cath placement? yes . If applicable, have lifetime cumulative doses been properly documented and assessed? yes Lifetime Dose Tracking  . Doxorubicin: 239.692 mg/m2 (456 mg) = 53.26 % of the maximum lifetime dose of 450 mg/m2  . Carboplatin: 5,480 mg = 0.01 % of the maximum lifetime dose of 999,999,999 mg  . Liposomal Doxorubicin: 169.107 mg/m2 (340 mg) = 37.58 % of the maximum lifetime dose of 450 mg/m2  o   Toxicity Monitoring/Prevention: . The patient has the following take home antiemetics prescribed: Prochlorperazine, Dexamethasone and Lorazepam . The patient has the following take home medications prescribed: VZV prophylaxis . Medication allergies and previous infusion related reactions, if applicable, have been  reviewed and addressed. Yes . The patient's current medication list has been assessed for drug-drug interactions with their chemotherapy regimen. no significant drug-drug interactions were identified on review.  Order Review: . Are the treatment plan orders signed? No . Is the patient scheduled to see a provider prior to their treatment? Yes  I verify that I have reviewed each item in the above checklist and answered each question accordingly.  Rebecca [name] 07/06/2020 10:48 AM

## 2020-07-07 ENCOUNTER — Inpatient Hospital Stay: Payer: Medicaid Other

## 2020-07-07 ENCOUNTER — Inpatient Hospital Stay: Payer: Medicaid Other | Admitting: Adult Health

## 2020-07-08 ENCOUNTER — Ambulatory Visit: Payer: Medicaid Other

## 2020-07-09 ENCOUNTER — Encounter (HOSPITAL_COMMUNITY): Payer: Self-pay

## 2020-07-09 ENCOUNTER — Telehealth: Payer: Self-pay | Admitting: *Deleted

## 2020-07-09 ENCOUNTER — Emergency Department (HOSPITAL_COMMUNITY)
Admission: EM | Admit: 2020-07-09 | Discharge: 2020-07-09 | Disposition: A | Discharge status: Home / Self Care | Payer: Medicaid Other | Attending: Emergency Medicine | Admitting: Emergency Medicine

## 2020-07-09 ENCOUNTER — Emergency Department (HOSPITAL_COMMUNITY): Payer: Medicaid Other

## 2020-07-09 ENCOUNTER — Other Ambulatory Visit: Payer: Self-pay

## 2020-07-09 DIAGNOSIS — Z5321 Procedure and treatment not carried out due to patient leaving prior to being seen by health care provider: Secondary | ICD-10-CM | POA: Insufficient documentation

## 2020-07-09 DIAGNOSIS — R079 Chest pain, unspecified: Secondary | ICD-10-CM | POA: Diagnosis not present

## 2020-07-09 DIAGNOSIS — R000 Tachycardia, unspecified: Secondary | ICD-10-CM | POA: Insufficient documentation

## 2020-07-09 DIAGNOSIS — J90 Pleural effusion, not elsewhere classified: Secondary | ICD-10-CM | POA: Diagnosis not present

## 2020-07-09 DIAGNOSIS — R0602 Shortness of breath: Secondary | ICD-10-CM | POA: Insufficient documentation

## 2020-07-09 DIAGNOSIS — J9811 Atelectasis: Secondary | ICD-10-CM | POA: Diagnosis not present

## 2020-07-09 NOTE — Telephone Encounter (Signed)
This RN spoke with pt per her VM stating she wasn't able to get the CT scheduled for today due to "I was going to the hospital to see my son and as I was walking up - I had a panic attack and couldn't get my breath and then they took me to the ER - but I left "  Note pt had a CXR while in the ER.  Pt is presently at home- she states she has lorazepam on hand and she has taken it.  She states she feels better.  She is able to take a very deep breath and not feel any discomfort or coughing.  This RN reviewed cxr with pt with noting of mild atelectasis - recommended for pt to purposely take very deep breaths several time every hour to help expand the lungs.  She asked why she had the fluid- informed her likely due to need for more chemo which will decrease known tumor growth.  She is scheduled for visit in AM and treatment.  No other acute needs at this time.  This note will be forwarded to providers for review and any further recommendations if needed.

## 2020-07-09 NOTE — ED Triage Notes (Signed)
Pt arrives POV for eval of shortness of breath onset a few minutes prior to arrival. Pt in no acute respiratory distress in triage, whispering during triage stating that she cannot breath. RR 20, satting 100% on RA, good air movement. Pt denies pain associated w/ SOB. Non tachycardic

## 2020-07-09 NOTE — ED Notes (Signed)
Pt is waiting outside in wheelchair

## 2020-07-09 NOTE — ED Notes (Signed)
Pt left the lobby at 14:12 pm.

## 2020-07-10 ENCOUNTER — Encounter: Payer: Self-pay | Admitting: Adult Health

## 2020-07-10 ENCOUNTER — Inpatient Hospital Stay: Payer: Medicaid Other

## 2020-07-10 ENCOUNTER — Inpatient Hospital Stay: Payer: Medicaid Other | Attending: Oncology

## 2020-07-10 ENCOUNTER — Other Ambulatory Visit: Payer: Self-pay

## 2020-07-10 ENCOUNTER — Inpatient Hospital Stay (HOSPITAL_BASED_OUTPATIENT_CLINIC_OR_DEPARTMENT_OTHER): Payer: Medicaid Other | Admitting: Adult Health

## 2020-07-10 VITALS — BP 94/64 | HR 73 | Temp 97.6°F | Resp 16

## 2020-07-10 DIAGNOSIS — C50919 Malignant neoplasm of unspecified site of unspecified female breast: Secondary | ICD-10-CM

## 2020-07-10 DIAGNOSIS — Z9011 Acquired absence of right breast and nipple: Secondary | ICD-10-CM | POA: Diagnosis not present

## 2020-07-10 DIAGNOSIS — Z171 Estrogen receptor negative status [ER-]: Secondary | ICD-10-CM | POA: Diagnosis not present

## 2020-07-10 DIAGNOSIS — C7951 Secondary malignant neoplasm of bone: Secondary | ICD-10-CM

## 2020-07-10 DIAGNOSIS — C778 Secondary and unspecified malignant neoplasm of lymph nodes of multiple regions: Secondary | ICD-10-CM | POA: Insufficient documentation

## 2020-07-10 DIAGNOSIS — Z5112 Encounter for antineoplastic immunotherapy: Secondary | ICD-10-CM | POA: Diagnosis present

## 2020-07-10 DIAGNOSIS — C792 Secondary malignant neoplasm of skin: Secondary | ICD-10-CM | POA: Diagnosis not present

## 2020-07-10 DIAGNOSIS — E669 Obesity, unspecified: Secondary | ICD-10-CM | POA: Insufficient documentation

## 2020-07-10 DIAGNOSIS — R110 Nausea: Secondary | ICD-10-CM | POA: Diagnosis not present

## 2020-07-10 DIAGNOSIS — K760 Fatty (change of) liver, not elsewhere classified: Secondary | ICD-10-CM | POA: Insufficient documentation

## 2020-07-10 DIAGNOSIS — Z803 Family history of malignant neoplasm of breast: Secondary | ICD-10-CM | POA: Insufficient documentation

## 2020-07-10 DIAGNOSIS — I10 Essential (primary) hypertension: Secondary | ICD-10-CM | POA: Diagnosis not present

## 2020-07-10 DIAGNOSIS — D509 Iron deficiency anemia, unspecified: Secondary | ICD-10-CM | POA: Insufficient documentation

## 2020-07-10 DIAGNOSIS — G893 Neoplasm related pain (acute) (chronic): Secondary | ICD-10-CM | POA: Diagnosis not present

## 2020-07-10 DIAGNOSIS — C50411 Malignant neoplasm of upper-outer quadrant of right female breast: Secondary | ICD-10-CM

## 2020-07-10 DIAGNOSIS — K5900 Constipation, unspecified: Secondary | ICD-10-CM | POA: Insufficient documentation

## 2020-07-10 DIAGNOSIS — F419 Anxiety disorder, unspecified: Secondary | ICD-10-CM | POA: Insufficient documentation

## 2020-07-10 DIAGNOSIS — B373 Candidiasis of vulva and vagina: Secondary | ICD-10-CM | POA: Insufficient documentation

## 2020-07-10 DIAGNOSIS — I700 Atherosclerosis of aorta: Secondary | ICD-10-CM | POA: Diagnosis not present

## 2020-07-10 DIAGNOSIS — Z800 Family history of malignant neoplasm of digestive organs: Secondary | ICD-10-CM | POA: Insufficient documentation

## 2020-07-10 DIAGNOSIS — Z7189 Other specified counseling: Secondary | ICD-10-CM

## 2020-07-10 DIAGNOSIS — K219 Gastro-esophageal reflux disease without esophagitis: Secondary | ICD-10-CM | POA: Diagnosis not present

## 2020-07-10 DIAGNOSIS — D573 Sickle-cell trait: Secondary | ICD-10-CM | POA: Insufficient documentation

## 2020-07-10 DIAGNOSIS — Z87891 Personal history of nicotine dependence: Secondary | ICD-10-CM | POA: Diagnosis not present

## 2020-07-10 LAB — RETICULOCYTES
Immature Retic Fract: 28.9 % — ABNORMAL HIGH (ref 2.3–15.9)
RBC.: 3.09 MIL/uL — ABNORMAL LOW (ref 3.87–5.11)
Retic Count, Absolute: 44.2 10*3/uL (ref 19.0–186.0)
Retic Ct Pct: 1.4 % (ref 0.4–3.1)

## 2020-07-10 LAB — CBC WITH DIFFERENTIAL/PLATELET
Abs Immature Granulocytes: 0.02 10*3/uL (ref 0.00–0.07)
Basophils Absolute: 0 10*3/uL (ref 0.0–0.1)
Basophils Relative: 1 %
Eosinophils Absolute: 0 10*3/uL (ref 0.0–0.5)
Eosinophils Relative: 1 %
HCT: 28.4 % — ABNORMAL LOW (ref 36.0–46.0)
Hemoglobin: 9.5 g/dL — ABNORMAL LOW (ref 12.0–15.0)
Immature Granulocytes: 0 %
Lymphocytes Relative: 20 %
Lymphs Abs: 1.5 10*3/uL (ref 0.7–4.0)
MCH: 31 pg (ref 26.0–34.0)
MCHC: 33.5 g/dL (ref 30.0–36.0)
MCV: 92.8 fL (ref 80.0–100.0)
Monocytes Absolute: 1.4 10*3/uL — ABNORMAL HIGH (ref 0.1–1.0)
Monocytes Relative: 19 %
Neutro Abs: 4.5 10*3/uL (ref 1.7–7.7)
Neutrophils Relative %: 59 %
Platelets: 235 10*3/uL (ref 150–400)
RBC: 3.06 MIL/uL — ABNORMAL LOW (ref 3.87–5.11)
RDW: 17 % — ABNORMAL HIGH (ref 11.5–15.5)
WBC: 7.6 10*3/uL (ref 4.0–10.5)
nRBC: 0 % (ref 0.0–0.2)

## 2020-07-10 LAB — COMPREHENSIVE METABOLIC PANEL
ALT: 10 U/L (ref 0–44)
AST: 19 U/L (ref 15–41)
Albumin: 3.7 g/dL (ref 3.5–5.0)
Alkaline Phosphatase: 69 U/L (ref 38–126)
Anion gap: 11 (ref 5–15)
BUN: 6 mg/dL (ref 6–20)
CO2: 24 mmol/L (ref 22–32)
Calcium: 9.7 mg/dL (ref 8.9–10.3)
Chloride: 106 mmol/L (ref 98–111)
Creatinine, Ser: 0.77 mg/dL (ref 0.44–1.00)
GFR calc Af Amer: >60 mL/min (ref >60)
GFR calc non Af Amer: >60 mL/min (ref >60)
Glucose, Bld: 99 mg/dL (ref 70–99)
Potassium: 3.6 mmol/L (ref 3.5–5.1)
Sodium: 141 mmol/L (ref 135–145)
Total Bilirubin: 0.3 mg/dL (ref 0.3–1.2)
Total Protein: 7.7 g/dL (ref 6.5–8.1)

## 2020-07-10 LAB — SAMPLE TO BLOOD BANK

## 2020-07-10 LAB — FERRITIN: Ferritin: 769 ng/mL — ABNORMAL HIGH (ref 11–307)

## 2020-07-10 MED ORDER — TRAMADOL HCL 50 MG PO TABS: 50.0000 mg | ORAL_TABLET | Freq: Four times a day (QID) | ORAL | 0 refills | Status: DC | PRN

## 2020-07-10 MED ORDER — ACETAMINOPHEN 325 MG PO TABS
ORAL_TABLET | ORAL | Status: AC
  Filled 2020-07-10: qty 2

## 2020-07-10 MED ORDER — FAMOTIDINE IN NACL 20-0.9 MG/50ML-% IV SOLN
INTRAVENOUS | Status: AC
  Filled 2020-07-10: qty 50

## 2020-07-10 MED ORDER — ATROPINE SULFATE 1 MG/ML IJ SOLN: 0.5000 mg | Freq: Once | INTRAMUSCULAR | Status: DC | PRN

## 2020-07-10 MED ORDER — SODIUM CHLORIDE 0.9% FLUSH
10.0000 mL | INTRAVENOUS | Status: DC | PRN
  Administered 2020-07-10: 10 mL
  Filled 2020-07-10: qty 10

## 2020-07-10 MED ORDER — SODIUM CHLORIDE 0.9 % IV SOLN
150.0000 mg | Freq: Once | INTRAVENOUS | Status: AC
  Administered 2020-07-10: 150 mg via INTRAVENOUS
  Filled 2020-07-10: qty 150

## 2020-07-10 MED ORDER — DIPHENHYDRAMINE HCL 50 MG/ML IJ SOLN
50.0000 mg | Freq: Once | INTRAMUSCULAR | Status: AC
  Administered 2020-07-10: 50 mg via INTRAVENOUS

## 2020-07-10 MED ORDER — SODIUM CHLORIDE 0.9 % IV SOLN
10.0000 mg/kg | Freq: Once | INTRAVENOUS | Status: AC
  Administered 2020-07-10: 1000 mg via INTRAVENOUS
  Filled 2020-07-10: qty 100

## 2020-07-10 MED ORDER — DIPHENHYDRAMINE HCL 25 MG PO CAPS
ORAL_CAPSULE | ORAL | Status: AC
  Filled 2020-07-10: qty 2

## 2020-07-10 MED ORDER — PALONOSETRON HCL INJECTION 0.25 MG/5ML
0.2500 mg | Freq: Once | INTRAVENOUS | Status: AC
  Administered 2020-07-10: 0.25 mg via INTRAVENOUS

## 2020-07-10 MED ORDER — ACETAMINOPHEN 325 MG PO TABS
650.0000 mg | ORAL_TABLET | Freq: Once | ORAL | Status: AC
  Administered 2020-07-10: 650 mg via ORAL

## 2020-07-10 MED ORDER — DIPHENHYDRAMINE HCL 50 MG/ML IJ SOLN
INTRAMUSCULAR | Status: AC
  Filled 2020-07-10: qty 1

## 2020-07-10 MED ORDER — HEPARIN SOD (PORK) LOCK FLUSH 100 UNIT/ML IV SOLN
500.0000 [IU] | Freq: Once | INTRAVENOUS | Status: AC | PRN
  Administered 2020-07-10: 500 [IU]
  Filled 2020-07-10: qty 5

## 2020-07-10 MED ORDER — PALONOSETRON HCL INJECTION 0.25 MG/5ML
INTRAVENOUS | Status: AC
  Filled 2020-07-10: qty 10

## 2020-07-10 MED ORDER — LORAZEPAM 0.5 MG PO TABS: 0.5000 mg | ORAL_TABLET | Freq: Every day | ORAL | 0 refills | Status: DC | PRN

## 2020-07-10 MED ORDER — SODIUM CHLORIDE 0.9 % IV SOLN
10.0000 mg | Freq: Once | INTRAVENOUS | Status: AC
  Administered 2020-07-10: 10 mg via INTRAVENOUS
  Filled 2020-07-10: qty 10

## 2020-07-10 MED ORDER — FAMOTIDINE IN NACL 20-0.9 MG/50ML-% IV SOLN
20.0000 mg | Freq: Once | INTRAVENOUS | Status: AC
  Administered 2020-07-10: 20 mg via INTRAVENOUS

## 2020-07-10 MED ORDER — SODIUM CHLORIDE 0.9 % IV SOLN
Freq: Once | INTRAVENOUS | Status: AC
  Filled 2020-07-10: qty 250

## 2020-07-10 NOTE — Patient Instructions (Signed)
Sacituzumab Govitecan Injection What is this medicine? SACITUZUMAB GOVITECAN (SAK i TOOZ ue mab GOE vi TEE kan) is a monoclonal antibody combined with chemotherapy. It is used to treat breast cancer. This medicine may be used for other purposes; ask your health care provider or pharmacist if you have questions. COMMON BRAND NAME(S): TRODELVY What should I tell my health care provider before I take this medicine? They need to know if you have any of these conditions:  diarrhea  infection (especially a virus infection such as chickenpox, cold sores, or herpes)  liver disease  low blood counts, like low white cell, platelet, or red cell counts  an unusual or allergic reaction to sacituzumab govitecan, other medicines, foods, dyes, or preservatives  pregnant or trying to get pregnant  breast-feeding How should I use this medicine? This medicine is for infusion into a vein. It is given by a healthcare professional in a hospital or clinic setting. Talk to your pediatrician about the use of this medicine in children. Special care may be needed. Overdosage: If you think you have taken too much of this medicine contact a poison control center or emergency room at once. NOTE: This medicine is only for you. Do not share this medicine with others. What if I miss a dose? Keep appointments for follow-up doses. It is important not to miss your dose. Call your doctor or health care professional if you are unable to keep an appointment. What may interact with this medicine?  certain antivirals for HIV or hepatitis  gemfibrozil This list may not describe all possible interactions. Give your health care provider a list of all the medicines, herbs, non-prescription drugs, or dietary supplements you use. Also tell them if you smoke, drink alcohol, or use illegal drugs. Some items may interact with your medicine. What should I watch for while using this medicine? Your condition will be monitored  carefully while you are receiving this medicine. You may need blood work done while you are taking this medicine. This medicine can cause serious allergic reactions. To reduce your risk, you may need to take medicine before treatment with this medicine. Take your medicine as directed. Do not become pregnant while taking this medicine or for 6 months after stopping it. Women should inform their health care professional if they wish to become pregnant or think they might be pregnant. Men should not father a child while taking this medicine and for 3 months after stopping it. There is potential for serious side effects to an unborn child. Talk to your health care professional for more information. Do not breast-feed a child while taking this medicine or for 1 month after stopping it. This medicine may increase your risk of getting an infection. Call your health care professional for advice if you get a fever, chills, or sore throat, or other symptoms of a cold or flu. Do not treat yourself. Try to avoid being around people who are sick. Avoid taking medicines that contain aspirin, acetaminophen, ibuprofen, naproxen, or ketoprofen unless instructed by your health care professional. These medicines may hide a fever. This medicine has caused ovarian failure in some women. This medicine may make it more difficult to get pregnant. Talk to your health care professional if you are concerned about your fertility. What side effects may I notice from receiving this medicine? Side effects that you should report to your doctor or health care professional as soon as possible:  allergic reactions like skin rash, itching or hives; swelling of the face,  lips, or tongue  diarrhea  nausea/vomiting  signs and symptoms of infection like fever; chills; cough; sore throat; pain or trouble passing urine  signs and symptoms of low red blood cells or anemia such as unusually weak or tired; feeling faint or lightheaded;  falls; breathing problems Side effects that usually do not require medical attention (report these to your doctor or health care professional if they continue or are bothersome):  constipation  hair loss  headache  loss of appetite  signs and symptoms of high blood sugar such as being more thirsty or hungry or having to urinate more than normal. You may also feel very tired or have blurry vision.  trouble sleeping This list may not describe all possible side effects. Call your doctor for medical advice about side effects. You may report side effects to FDA at 1-800-FDA-1088. Where should I keep my medicine? This medicine is given in a hospital or clinic and will not be stored at home. NOTE: This sheet is a summary. It may not cover all possible information. If you have questions about this medicine, talk to your doctor, pharmacist, or health care provider.  2020 [employer] Standard (2019-04-01 11:31:44)

## 2020-07-10 NOTE — Progress Notes (Signed)
Pt needs wound dsg change done to (R) chest per Wilber Bihari, NP. Pt prefers to perform dsg changes herself as she is used to doing them at home. Pt given (2) 4X4 gauze, abd pad, saline and tape for dsg change.

## 2020-07-10 NOTE — Patient Instructions (Addendum)
[facility] Discharge Instructions for Patients Receiving Chemotherapy  Today you received the following chemotherapy agents: sacituzumab govitecan.  To help prevent nausea and vomiting after your treatment, we encourage you to take your nausea medication as directed.   If you develop nausea and vomiting that is not controlled by your nausea medication, call the clinic.   BELOW ARE SYMPTOMS THAT SHOULD BE REPORTED IMMEDIATELY:  *FEVER GREATER THAN 100.5 F  *CHILLS WITH OR WITHOUT FEVER  NAUSEA AND VOMITING THAT IS NOT CONTROLLED WITH YOUR NAUSEA MEDICATION  *UNUSUAL SHORTNESS OF BREATH  *UNUSUAL BRUISING OR BLEEDING  TENDERNESS IN MOUTH AND THROAT WITH OR WITHOUT PRESENCE OF ULCERS  *URINARY PROBLEMS  *BOWEL PROBLEMS  UNUSUAL RASH Items with * indicate a potential emergency and should be followed up as soon as possible.  Feel free to call the clinic should you have any questions or concerns. The clinic phone number is [phone].  Please show the [facility] at check-in to the Emergency Department and triage nurse.  Sacituzumab Govitecan Injection What is this medicine? SACITUZUMAB GOVITECAN (SAK i TOOZ ue mab GOE vi TEE kan) is a monoclonal antibody combined with chemotherapy. It is used to treat breast cancer. This medicine may be used for other purposes; ask your health care provider or pharmacist if you have questions. COMMON BRAND NAME(S): TRODELVY What should I tell my health care provider before I take this medicine? They need to know if you have any of these conditions:  diarrhea  infection (especially a virus infection such as chickenpox, cold sores, or herpes)  liver disease  low blood counts, like low white cell, platelet, or red cell counts  an unusual or allergic reaction to sacituzumab govitecan, other medicines, foods, dyes, or preservatives  pregnant or trying to get pregnant  breast-feeding How should I use this  medicine? This medicine is for infusion into a vein. It is given by a healthcare professional in a hospital or clinic setting. Talk to your pediatrician about the use of this medicine in children. Special care may be needed. Overdosage: If you think you have taken too much of this medicine contact a poison control center or emergency room at once. NOTE: This medicine is only for you. Do not share this medicine with others. What if I miss a dose? Keep appointments for follow-up doses. It is important not to miss your dose. Call your doctor or health care professional if you are unable to keep an appointment. What may interact with this medicine?  certain antivirals for HIV or hepatitis  gemfibrozil This list may not describe all possible interactions. Give your health care provider a list of all the medicines, herbs, non-prescription drugs, or dietary supplements you use. Also tell them if you smoke, drink alcohol, or use illegal drugs. Some items may interact with your medicine. What should I watch for while using this medicine? Your condition will be monitored carefully while you are receiving this medicine. You may need blood work done while you are taking this medicine. This medicine can cause serious allergic reactions. To reduce your risk, you may need to take medicine before treatment with this medicine. Take your medicine as directed. Do not become pregnant while taking this medicine or for 6 months after stopping it. Women should inform their health care professional if they wish to become pregnant or think they might be pregnant. Men should not father a child while taking this medicine and for 3 months after stopping it. There  is potential for serious side effects to an unborn child. Talk to your health care professional for more information. Do not breast-feed a child while taking this medicine or for 1 month after stopping it. This medicine may increase your risk of getting an  infection. Call your health care professional for advice if you get a fever, chills, or sore throat, or other symptoms of a cold or flu. Do not treat yourself. Try to avoid being around people who are sick. Avoid taking medicines that contain aspirin, acetaminophen, ibuprofen, naproxen, or ketoprofen unless instructed by your health care professional. These medicines may hide a fever. This medicine has caused ovarian failure in some women. This medicine may make it more difficult to get pregnant. Talk to your health care professional if you are concerned about your fertility. What side effects may I notice from receiving this medicine? Side effects that you should report to your doctor or health care professional as soon as possible:  allergic reactions like skin rash, itching or hives; swelling of the face, lips, or tongue  diarrhea  nausea/vomiting  signs and symptoms of infection like fever; chills; cough; sore throat; pain or trouble passing urine  signs and symptoms of low red blood cells or anemia such as unusually weak or tired; feeling faint or lightheaded; falls; breathing problems Side effects that usually do not require medical attention (report these to your doctor or health care professional if they continue or are bothersome):  constipation  hair loss  headache  loss of appetite  signs and symptoms of high blood sugar such as being more thirsty or hungry or having to urinate more than normal. You may also feel very tired or have blurry vision.  trouble sleeping This list may not describe all possible side effects. Call your doctor for medical advice about side effects. You may report side effects to FDA at 1-800-FDA-1088. Where should I keep my medicine? This medicine is given in a hospital or clinic and will not be stored at home. NOTE: This sheet is a summary. It may not cover all possible information. If you have questions about this medicine, talk to your doctor,  pharmacist, or health care provider.  2020 [employer]vier/Gold Standard (2019-04-01 11:31:44)

## 2020-07-10 NOTE — Progress Notes (Signed)
Rebecca [name]  [phone] [phone]   ID: Unknown Rebecca [name] DOB: Nov 27, 1976  MR#: [id number]  ULA#:[id number]  Patient Care Team: Dorena Dew, FNP as PCP - General (Family Medicine) Alphonsa Overall, MD as Consulting Physician (General Surgery) Magrinat, [name], MD as Consulting Physician (Oncology) Gery Pray, MD as Consulting Physician (Radiation Oncology) Delice Bison, Charlestine Massed, NP as Nurse Practitioner (Hematology and Oncology) Alda Berthold, DO as Consulting Physician (Neurology) OTHER MD:  CHIEF COMPLAINT: triple negative stage IV breast cancer  CURRENT TREATMENT: to start [name]   INTERVAL HISTORY: Rebecca [name] was scheduled today for follow-up and treatment of her triple negative breast cancer.  Her most recent CT scan was consistent with progression and she is starting on therapy with [name].  She continues to pack her wound with saline soaked gauze, and dresses it. She notes stinging at the site when changing her dressing.  She also noted that after her most recent Doxil she developed some skin peeling on her palms.  This is improving.    Margee is requesting a refill on her pain medication.  She is taking Tramadol about one a day when needed for pain in her chest wall.  She is also requesting a refill on her Lorazepam as she takes this daily as needed for anxiety.    REVIEW OF SYSTEMS: Olamide denies any new issues today such as fever, chills, chest pain, palpitations, cough, bowel/bladder changes, shortness of breath, headaches, vision issues, or any other concerns.  A detailed ROS was otherwise non contributory.      BREAST CANCER HISTORY: From the original intake note:  [name] herself noted a change in her right breast sometime around September or October 2016. She did not bring it to intermediate medical attention, but on 01/13/2016 she established herself in Dr. Smith Robert' service and she was set up for bilateral diagnostic mammography  with tomosynthesis and bilateral ultrasonography at the [facility] 01/19/2016. The breast density was category B. In the upper outer quadrant of the right breast there was a spiculated mass measuring 2.8 cm. On physical exam this was palpable. Targeted ultrasonography confirmed an irregular hypoechoic mass in the right breast 11:30 o'clock position measuring 2.6 cm maximally. Ultrasound of the right axilla showed a morphologically abnormal lymph node.  In the left breast there were some tubular densities behind the areola which by ultrasonography showed benign ductal ectasia.  On 01/28/2016 [name] underwent biopsy of the right breast mass and abnormal right axillary lymph node. The pathology from this procedure (S [id number]) showed the lymph node to be benign. In the breast however there was an invasive ductal carcinoma, grade 3, which was estrogen and progesterone receptor negative. The proliferation marker was 70%. HER-2 was not amplified with a signals ratio of 1.32. The number per cell was 2.05.  The patient's subsequent history is as detailed below   PAST MEDICAL HISTORY: Past Medical History:  Diagnosis Date  . Anxiety   . Breast cancer ([place])   . Depression   . GERD (gastroesophageal reflux disease)   . History of radiation therapy 11/15/16-01/12/17   right chest wall and axilla treated to 45 Gy in 25 fractions, boosted and additional 14 Gy in 8 fractions  . Hypertension    diet controlled  . Obesity (BMI 35.0-39.9 without comorbidity)   . Pneumonia    as a child  . Seasonal allergies   . Sickle cell trait ([place])   . Termination of pregnancy (fetus) 04/02/16  PAST SURGICAL HISTORY: Past Surgical History:  Procedure Laterality Date  . CESAREAN SECTION     2004 and 2007  . MASTECTOMY W/ SENTINEL NODE BIOPSY Right 09/06/2016   Procedure: RIGHT BREAST MASTECTOMY WITH RIGHT AXILLARY SENTINEL LYMPH NODE BIOPSY;  Surgeon: David Newman, MD;  Location: [facility];   Service: General;  Laterality: Right;  . PORT-A-CATH REMOVAL Left 09/06/2016   Procedure: REMOVAL PORT-A-CATH;  Surgeon: David Newman, MD;  Location: [facility];  Service: General;  Laterality: Left;  . PORTACATH PLACEMENT    . PORTACATH PLACEMENT N/A 07/09/2019   Procedure: INSERTION PORT-A-CATH WITH ULTRASOUND;  Surgeon: Newman, David, MD;  Location: [facility];  Service: General;  Laterality: N/A;    FAMILY HISTORY Family History  Problem Relation Age of Onset  . Hypertension Mother   . Cancer Mother        dx "intestinal cancer" in her 20s; +surgery  . Other Mother        hysterectomy at young age for unspecified cause  . Heart Problems Mother   . Breast cancer Cousin        maternal 1st cousin dx female breast cancer at 45-46y  . Cancer Father   . Hypertension Father   . Heart Problems Maternal Aunt   . Diabetes Maternal Aunt   . Breast cancer Maternal Uncle        dx 64-65  . Heart Problems Maternal Uncle   . Breast cancer Maternal Grandmother 50  . Throat cancer Maternal Grandfather        d. 50s; smoker  . Sickle cell anemia Paternal Aunt   . Congestive Heart Failure Maternal Aunt   . Multiple sclerosis Cousin   . Cancer Other        maternal great uncle (MGM's brother); cancer removed from his side  . Heart attack Paternal Aunt        d. early 50s  The patient has very little information about her father. Her mother is currently 63 years old. She had a history of cervical cancer at age 28. The patient had 2 brothers, no sisters. The maternal grandfather had throat cancer. A maternal uncle was diagnosed with breast cancer as well as prostate cancer at the age of 65. 2 maternal cousins, one of them female, had breast cancer as well.   GYNECOLOGIC HISTORY:  No LMP recorded. Menarche age 7, first live birth age 15. The patient is GX P4. She was still having regular periods at the time of diagnosis. She took oral contraceptives in the 1990s with no side effects.--.   Stopped with chemotherapy and have not resumed as of May 2019   SOCIAL HISTORY:  (Updated 10/2018) She works as a personal care CNA. The patient's significant other [name] works at [employer].  At home with the patient are her 3 children [name], Shakya Huntley and [name]. There are age 16, 15, and 12 as of November 2019.  2 of them are disabled or have significant health problems, one with sickle cell disease, the other with autism and developmental delay.  The patient's son Mark King, currently 26 years old, lives in [place].    ADVANCED DIRECTIVES: Not in place   HEALTH MAINTENANCE: Social History   Tobacco Use  . Smoking status: Former Smoker    Packs/day: 1.00    Years: 20.00    Pack years: 20.00    Types: Cigarettes    Quit date: 04/01/2018      Years since quitting: 2.2  . Smokeless tobacco: Never Used  . Tobacco comment: Patient has quit smoking x 1 year now  Vaping Use  . Vaping Use: Never used  Substance Use Topics  . Alcohol use: Yes    Comment: occ  . Drug use: No    Colonoscopy:  PAP:  Bone density:  Lipid panel:  No Known Allergies  Current Outpatient Medications on File Prior to Visit  Medication Sig Dispense Refill  . calcium carbonate (TUMS - DOSED IN MG ELEMENTAL CALCIUM) 500 MG chewable tablet Chew 1 tablet by mouth daily as needed for indigestion or heartburn.    . dexamethasone (DECADRON) 4 MG tablet Take 2 tablets (8 mg) daily for 3 days after chemotherapy. Take with food. 30 tablet 1  . doxycycline (VIBRA-TABS) 100 MG tablet Take 1 tablet (100 mg total) by mouth daily. 30 tablet 4  . lidocaine-prilocaine (EMLA) cream Apply to affected area once 30 g 3  . loperamide (IMODIUM A-D) 2 MG tablet Take 2 at diarrhea onset, then 1 every 2hr until 12hrs with no BM. May take 2 every 4hrs at night. If diarrhea recurs repeat. 100 tablet 1  . LORazepam (ATIVAN) 0.5 MG tablet Take 1 tablet (0.5 mg total) by mouth daily as  needed for anxiety. 30 tablet 0  . prochlorperazine (COMPAZINE) 10 MG tablet Take 1 tablet (10 mg total) by mouth every 6 (six) hours as needed (Nausea or vomiting). 30 tablet 1  . traMADol (ULTRAM) 50 MG tablet Take 1 tablet (50 mg total) by mouth every 6 (six) hours as needed. 30 tablet 0  . valACYclovir (VALTREX) 1000 MG tablet Take 1 tablet (1,000 mg total) by mouth daily. 90 tablet 1   No current facility-administered medications on file prior to visit.    OBJECTIVE: African-American woman  Vitals:   07/10/20 0928  BP: 117/79  Pulse: 100  Resp: 18  Temp: 98 F (36.7 C)  SpO2: 100%   Wt Readings from Last 3 Encounters:  07/10/20 196 lb 14.4 oz (89.3 kg)  07/09/20 202 lb 13.2 oz (92 kg)  06/09/20 203 lb (92.1 kg)   Body mass index is 36.6 kg/m.    ECOG FS: 2 GENERAL: Patient is a well appearing female in no acute distress HEENT:  Sclerae anicteric.  Mask in place. Neck is supple.  NODES:  No cervical, supraclavicular, or axillary lymphadenopathy palpated.  BREAST EXAM:  Deferred. LUNGS:  Clear to auscultation bilaterally.  No wheezes or rhonchi. HEART:  Regular rate and rhythm. No murmur appreciated. ABDOMEN:  Soft, nontender.  Positive, normoactive bowel sounds. No organomegaly palpated. MSK:  No focal spinal tenderness to palpation. Full range of motion bilaterally in the upper extremities. EXTREMITIES:  No peripheral edema.   SKIN:  Clear with no obvious rashes or skin changes. No nail dyscrasia. NEURO:  Nonfocal. Well oriented.  Appropriate affect.  Right chest wall 07/10/2020--at start of Trodelvy    Right breast 05/06/2020    Right chest wall area 11/22/2019, which is the new baseline   LAB RESULTS: CMP Latest Ref Rng & Units 06/09/2020 05/19/2020 05/06/2020  Glucose 70 - 99 mg/dL 93 112(H) 102(H)  BUN 6 - 20 mg/dL _0 Creatinine 0.44 - 1.00 mg/dL 0.96 0.79 0.91  Sodium 135 - 145 mmol/L 142 144 141  Potassium 3.5 - 5.1 mmol/L 3.3(L) 3.6 3.6  Chloride 98 -  111 mmol/L 104 107 106  CO2 22 - 32 mmol/L _1 Calcium  8.9 - 10.3 mg/dL 9.6 9.1 9.2  Total Protein 6.5 - 8.1 g/dL 7.7 7.4 7.6  Total Bilirubin 0.3 - 1.2 mg/dL 0.3 0.2(L) 0.2(L)  Alkaline Phos 38 - 126 U/L 66 71 80  AST 15 - 41 U/L 17 13(L) 13(L)  ALT 0 - 44 U/L _0 CBC    Component Value Date/Time   WBC 7.6 07/10/2020 0918   RBC 3.09 (L) 07/10/2020 0918   RBC 3.06 (L) 07/10/2020 0918   HGB 9.5 (L) 07/10/2020 0918   HGB 6.3 (LL) 11/08/2019 1350   HGB 10.4 (L) 10/04/2016 1154   HCT 28.4 (L) 07/10/2020 0918   HCT 31.3 (L) 10/04/2016 1154   PLT 235 07/10/2020 0918   PLT 10 (L) 11/08/2019 1350   PLT 320 10/04/2016 1154   MCV 92.8 07/10/2020 0918   MCV 95.7 10/04/2016 1154   MCH 31.0 07/10/2020 0918   MCHC 33.5 07/10/2020 0918   RDW 17.0 (H) 07/10/2020 0918   RDW 16.3 (H) 10/04/2016 1154   LYMPHSABS PENDING 07/10/2020 0918   LYMPHSABS 1.7 10/04/2016 1154   MONOABS PENDING 07/10/2020 0918   MONOABS 0.5 10/04/2016 1154   EOSABS PENDING 07/10/2020 0918   EOSABS 0.3 10/04/2016 1154   BASOSABS PENDING 07/10/2020 0918   BASOSABS 0.0 10/04/2016 1154    STUDIES: DG Chest 2 View  Result Date: 07/09/2020 CLINICAL DATA:  Chest pain EXAM: CHEST - 2 VIEW COMPARISON:  06/26/2020 CT chest. 07/09/2019 chest radiograph and prior. FINDINGS: Cardiomediastinal silhouette within normal limits. Minimal left basilar opacities, likely atelectasis. No pneumothorax or pleural effusion. No acute osseous abnormality. Left chest wall Port-A-Cath tip overlies the upper right atrium. IMPRESSION: Minimal left basilar opacities, likely atelectasis. No acute airspace disease. Electronically Signed   By: Primitivo Gauze M.D.   On: 07/09/2020 13:22   CT Soft Tissue Neck W Contrast  Result Date: 06/27/2020 CLINICAL DATA:  Metastatic breast cancer with difficulty swallowing. EXAM: CT NECK WITH CONTRAST TECHNIQUE: Multidetector CT imaging of the neck was performed using the standard protocol  following the bolus administration of intravenous contrast. CONTRAST:  38m OMNIPAQUE IOHEXOL 300 MG/ML  SOLN COMPARISON:  None. FINDINGS: Pharynx and larynx: No evidence of mass or swelling. No mass effect on the airway. Salivary glands: No inflammation, mass, or stone. Thyroid: Normal. Lymph nodes: None enlarged in the neck. Vascular: Partially covered left subclavian porta catheter. Limited intracranial: Negative Visualized orbits: Partial coverage is negative Mastoids and visualized paranasal sinuses: Clear. Skeleton: No evidence of metastatic disease. Cervical endplate spurring. Upper chest: Reported on chest CT from the same day IMPRESSION: No evidence of metastatic disease to the neck. No explanation for difficulty swallowing. Electronically Signed   By: JMonte FantasiaM.D.   On: 06/27/2020 16:26   CT Chest W Contrast  Result Date: 06/26/2020 CLINICAL DATA:  Breast cancer staging, history of mastectomy with recurrence, status post chemo radiotherapy. EXAM: CT CHEST WITH CONTRAST TECHNIQUE: Multidetector CT imaging of the chest was performed during intravenous contrast administration. CONTRAST:  765mOMNIPAQUE IOHEXOL 300 MG/ML  SOLN COMPARISON:  CT chest 04/02/2020 FINDINGS: Cardiovascular: Normal caliber thoracic aorta. Central pulmonary vasculature on venous phase assessment is unremarkable. Heart size is normal. No pericardial effusion. LEFT-sided Port-A-Cath terminating at the caval to atrial junction. Mediastinum/Nodes: Bulky LEFT axillary adenopathy with perhaps slight interval increase the interval (16, series 2) 16 mm LEFT subpectoral lymph node previously approximately 14-15 mm. Short axis dimension of bulky LEFT axillary adenopathy (image 38, series 2) 2.4 cm  previously, now 2.7 cm. The area is limited with respect overall assessment however given incomplete imaging of this location. Another high LEFT axillary lymph node measuring 13 mm (image 21, series 2) previously approximately 9 mm short  axis. RIGHT axillary nodal enlargement (image 36, series 2) 1 cm short axis previously 11 mm. Anterior mediastinal soft tissue along the internal mammary chain (image 28, series 2) 11 mm, previously 7 mm. Expansion of RIGHT pectoralis major and soft tissue along its inferior extent may be slightly diminished, difficult to measure given infiltrative nature. Ulcerated area, at the site of previous chest wall soft tissue with less soft tissue and now frank ulceration in the area along the inferior margin of the pectoralis and RIGHT anterior chest wall. Plaque-like area of soft tissue thickening previously measured up to 13 mm, above the area of ulceration where there is still dermal thickening is approximately 5 mm on today's exam. No hilar adenopathy.  No mediastinal adenopathy. Lungs/Pleura: No consolidation. No pleural effusion. Pleural thickening along the RIGHT anterior chest wall. Scarring in the RIGHT middle lobe. No suspicious mass or nodule in the chest. Upper Abdomen: Hepatic steatosis with lobular hepatic contours. Limited assessment of the liver without focal, suspicious lesion. No pericholecystic stranding. Gallbladder incompletely imaged. Adrenal glands are unremarkable with respect to imaged portions. No acute upper abdominal process. Musculoskeletal: Chest wall findings and axillary findings described in detail above. No destructive bone finding. No acute bone process. IMPRESSION: 1. Bulky LEFT axillary adenopathy with perhaps slight interval increase the interval, also associated with RIGHT internal mammary nodal enlargement that may show very slight increase when compared to the previous study, attention on follow-up. 2. Expansion of RIGHT pectoralis musculature may be slightly improved and the amount of soft tissue in the area over the RIGHT chest wall has diminished though there is now frank ulceration at the site of soft tissue thickening seen on the previous exam overlying inferior aspect of the  pectoralis muscle. 3. Hepatic steatosis with lobular hepatic contours. Findings raise the question of liver disease/early cirrhosis. 4. Aortic atherosclerosis. Aortic Atherosclerosis (ICD10-I70.0). Electronically Signed   By: Zetta Bills M.D.   On: 06/26/2020 12:19     RESEARCH: Referred to PREVENT study, but was pregnant at the time; referred to [facility] 11202, but the biopsied lymph node was benign; referred to weight loss study but declined; referred to [facility] 12/09/2000, but the timing of radiation and 8 was greater than 60 days past the date of diagnosis; referred to health disparity study, but declined; referred to [id number] adjuvant therapy study, but the patient received Xeloda with radiation and therefore was ineligible   ASSESSMENT: 44 y.o. [place] woman status post right breast upper-outer quadrant biopsy 01/28/2016 for a clinical T2 N0 invasive ductal carcinoma, grade 3, triple negative, with an MIB-1 of 70%.  (a) suspicious right axillary lymph node biopsied 01/28/2016 was benign  (1) neoadjuvant chemotherapy: doxorubicin and cyclophosphamide in dose dense fashion 4 started 04/14/16, completed 05/26/2016, followed by paclitaxel and carboplatin weekly 12, Started 06/09/2016  (a) taxol discontinued after 7 doses because of neuropathy, last dose 07/21/2016  (2) genetics testing October 20, 2016 through the 32-gene Comprehensive Cancer Panel offered by [facility] Junius Roads, MD) (with MSH2 Exons 1-7 Inversion Analysis) found no deleterious mutations or VUSS  In APC, ATM, AXIN2, BARD1, BMPR1A, BRCA1, BRCA2, BRIP1, CDH1, CDK4, CDKN2A, CHEK2, EPCAM, FANCC, MLH1, MSH2, MSH6, MUTYH, NBN, PALB2, PMS2, POLD1, POLE, PTEN, RAD51C, RAD51D, SCG5/GREM1, SMAD4, STK11, TP53, VHL, and XRCC2.    (  3) right mastectomy and sentinel lymph node sampling 09/06/2016 showed a residual ypT1c ypN0, invasive ductal carcinoma, grade 3, with negative margins. Repeat prognostic panel again triple  negative   (4) adjuvant radiation with capecitabine/Xeloda sensitization 11/15/16 - 01/12/17 Site/dose:   Right Chest Wall and axilla (4 field) treated to 45 Gy in 25 fractions, and then Boosted an additional 14.4 Gy in 8 fractions.  (5) tobacco abuse disorder: The patient quit smoking 04/04/2018  METASTATIC DISEASE:  July 2020: chest wall, bones, nodes (6) nonspecific changes noted on chest CT 06/11/2019 were clarified by PET scan 06/19/2019 showing hypermetabolic disease in the right anterior chest wall, right internal mammary nodes, right and left axillary nodes, but no metastatic disease in the neck, lungs, abdomen or pelvis.  Bone marrow uptake suggests bony metastatic disease.  (a) CARIS requested obtained from 06/28/2019 sample confirmed a triple negativity, the tumor also was negative for the androgen receptor, was MSI stable and mismatch repair status proficient, with a low mutational burden.  BRCA 1 and 2 were negative and PD-L1 was negative.  PI K3 showed a variant of uncertain significance.  However the tumor was genomic LOH high  (b) CA-27-29 is informative: was 118.3 on 06/04/2019  (7) zoledronate started 07/17/2019--on hold currently due to dental issues, and until 6 weeks at least after dental work  (8) carboplatin/ gemcitabine days 1 and 8 Q21 day cycle started 07/10/2019, last dose 10/30/2019  (a) restaging studies 09/2019 showed no progression  (b) restaging studies after 6 cycles showed mild disease progression  (9) cyclophosphamide, methotrexate, fluorouracil chemotherapy started 11/13/2019, repeated every 21 days.  (a) discontinued after cycle 3 (12/31/2019): No evidence of response  (10) started capecitabine 01/21/2020, given 1 week on and 1 week off at standard doses (1500 mg twice daily)   (a) Discontinued after almost three months of treatment (last on 04/06/2020)  (b) Disease progression on 4/27 CT scan showing increasing right chest wall disease, left breast lesions,  and ? Liver involvement.  (c) MRI liver 04/14/2020 shows no liver invovlement  (11) Started liposomal doxorubicin/Doxil given on day 1 of a 21 day cycle on 04/07/2020  (a) echo on 04/02/2020 shows EF of 50-55%, repeat in 06/2020  (b) chest CT scan after 4 cycles of Doxil shows some evidence of progression in left axillary and right internal mammary adenopathy  (c) Doxil  after 06/09/2020 dose  (12) to start sacituzumab govitecan/ Trodelvy 07/07/2020    PLAN: Cashlynn is here today to start new treatment with Trodelvy.  We reviewed common adverse effects and I let her know the issue we very likely may run into is decreased WBC requiring Neupogen or Neulasta considering her h/o previous chemotherapy.  She understands this.  A patient info sheet was placed in her after visit summary.    She will receive this on day 1 and 8 of a 21 day cycle.  She will be restaged after 4 cycles with CT chest, and I placed the orders accordingly.    Sophiagrace and I talked about her skin.  This is likely related to the Doxil that she is no longer receiving and I am hopeful she will continue to notice improvement the further she gets away from that therapy.    Shanequa has cancer related pain and will continue taking her current regimen with Tramadol as she is maintaining her ability to function.  She is not to tired from this and is not having any constipation.  I reviewed PMP aware, no red   flags, last fill was in 04/2020.  #30 sent to her pharmacy.  I also refilled her Lorazepam given daily as needed for her anxiety.  She continues to tolerate this regimen well, and per my PMP aware review her last fill was in 04/2020.    We will see Waverley back in one week for labs, f/u, and her day 8 Trodelvy.  She knows to call for any questions that may arise between now and her next appointment.  We are happy to see her sooner if needed.   Total encounter time: 30 minutes*  Wilber Bihari, NP 07/10/20 9:38 AM Medical Oncology and  Hematology [facility] [place], [place] [address] Tel. [phone]    Fax. [phone]  *Total Encounter Time as defined by the Centers for Medicare and Medicaid Services includes, in addition to the face-to-face time of a patient visit (documented in the note above) non-face-to-face time: obtaining and reviewing outside history, ordering and reviewing medications, tests or procedures, care coordination (communications with other health care professionals or caregivers) and documentation in the medical record.    *Total Encounter Time as defined by the Centers for Medicare and Medicaid Services includes, in addition to the face-to-face time of a patient visit (documented in the note above) non-face-to-face time: obtaining and reviewing outside history, ordering and reviewing medications, tests or procedures, care coordination (communications with other health care professionals or caregivers) and documentation in the medical record.

## 2020-07-10 NOTE — Progress Notes (Signed)
Ok to give Trodelvy 1000 mg today per Dr. Lindi Adie (in Dr. [name] absence). Weight/dose difference >10%.  Kennith Center, Pharm.D., CPP 07/10/2020@12 :30 PM

## 2020-07-10 NOTE — Progress Notes (Signed)
Right chest wall at trodelvy start

## 2020-07-11 LAB — CANCER ANTIGEN 27.29: CA 27.29: 324.1 U/mL — ABNORMAL HIGH (ref 0.0–38.6)

## 2020-07-13 ENCOUNTER — Telehealth: Payer: Self-pay | Admitting: *Deleted

## 2020-07-13 ENCOUNTER — Telehealth: Payer: Self-pay | Admitting: Adult Health

## 2020-07-13 NOTE — Telephone Encounter (Signed)
Scheduled appts per 8/6 los. Pt confirmed next appt date and time.

## 2020-07-14 ENCOUNTER — Other Ambulatory Visit: Payer: Medicaid Other

## 2020-07-14 ENCOUNTER — Inpatient Hospital Stay: Payer: Medicaid Other

## 2020-07-14 ENCOUNTER — Inpatient Hospital Stay: Payer: Medicaid Other | Admitting: Adult Health

## 2020-07-16 ENCOUNTER — Other Ambulatory Visit: Payer: Medicaid Other

## 2020-07-16 ENCOUNTER — Other Ambulatory Visit (HOSPITAL_COMMUNITY): Payer: Medicaid Other

## 2020-07-17 ENCOUNTER — Ambulatory Visit (HOSPITAL_COMMUNITY)
Admission: RE | Admit: 2020-07-17 | Discharge: 2020-07-17 | Disposition: A | Discharge status: Home / Self Care | Payer: Medicaid Other | Source: Ambulatory Visit | Attending: Adult Health | Admitting: Adult Health

## 2020-07-17 ENCOUNTER — Inpatient Hospital Stay: Payer: Medicaid Other

## 2020-07-17 ENCOUNTER — Other Ambulatory Visit: Payer: Self-pay

## 2020-07-17 ENCOUNTER — Inpatient Hospital Stay (HOSPITAL_BASED_OUTPATIENT_CLINIC_OR_DEPARTMENT_OTHER): Payer: Medicaid Other | Admitting: Adult Health

## 2020-07-17 ENCOUNTER — Encounter: Payer: Self-pay | Admitting: Adult Health

## 2020-07-17 VITALS — BP 112/72 | HR 124 | Temp 98.5°F | Resp 17 | Ht 61.5 in | Wt 192.5 lb

## 2020-07-17 VITALS — HR 108

## 2020-07-17 DIAGNOSIS — Z171 Estrogen receptor negative status [ER-]: Secondary | ICD-10-CM

## 2020-07-17 DIAGNOSIS — C7951 Secondary malignant neoplasm of bone: Secondary | ICD-10-CM

## 2020-07-17 DIAGNOSIS — C50411 Malignant neoplasm of upper-outer quadrant of right female breast: Secondary | ICD-10-CM

## 2020-07-17 DIAGNOSIS — C50919 Malignant neoplasm of unspecified site of unspecified female breast: Secondary | ICD-10-CM

## 2020-07-17 DIAGNOSIS — K5900 Constipation, unspecified: Secondary | ICD-10-CM | POA: Diagnosis not present

## 2020-07-17 DIAGNOSIS — M47816 Spondylosis without myelopathy or radiculopathy, lumbar region: Secondary | ICD-10-CM | POA: Diagnosis not present

## 2020-07-17 DIAGNOSIS — Z5112 Encounter for antineoplastic immunotherapy: Secondary | ICD-10-CM | POA: Diagnosis not present

## 2020-07-17 DIAGNOSIS — Z7189 Other specified counseling: Secondary | ICD-10-CM

## 2020-07-17 LAB — CBC WITH DIFFERENTIAL/PLATELET
Abs Immature Granulocytes: 0.01 10*3/uL (ref 0.00–0.07)
Basophils Absolute: 0 10*3/uL (ref 0.0–0.1)
Basophils Relative: 1 %
Eosinophils Absolute: 0 10*3/uL (ref 0.0–0.5)
Eosinophils Relative: 1 %
HCT: 27.2 % — ABNORMAL LOW (ref 36.0–46.0)
Hemoglobin: 9.1 g/dL — ABNORMAL LOW (ref 12.0–15.0)
Immature Granulocytes: 0 %
Lymphocytes Relative: 38 %
Lymphs Abs: 1.2 10*3/uL (ref 0.7–4.0)
MCH: 30.4 pg (ref 26.0–34.0)
MCHC: 33.5 g/dL (ref 30.0–36.0)
MCV: 91 fL (ref 80.0–100.0)
Monocytes Absolute: 0.7 10*3/uL (ref 0.1–1.0)
Monocytes Relative: 22 %
Neutro Abs: 1.2 10*3/uL — ABNORMAL LOW (ref 1.7–7.7)
Neutrophils Relative %: 38 %
Platelets: 158 10*3/uL (ref 150–400)
RBC: 2.99 MIL/uL — ABNORMAL LOW (ref 3.87–5.11)
RDW: 16.8 % — ABNORMAL HIGH (ref 11.5–15.5)
WBC: 3.1 10*3/uL — ABNORMAL LOW (ref 4.0–10.5)
nRBC: 0 % (ref 0.0–0.2)

## 2020-07-17 LAB — COMPREHENSIVE METABOLIC PANEL
ALT: 7 U/L (ref 0–44)
AST: 11 U/L — ABNORMAL LOW (ref 15–41)
Albumin: 3.7 g/dL (ref 3.5–5.0)
Alkaline Phosphatase: 70 U/L (ref 38–126)
Anion gap: 12 (ref 5–15)
BUN: 7 mg/dL (ref 6–20)
CO2: 26 mmol/L (ref 22–32)
Calcium: 10.4 mg/dL — ABNORMAL HIGH (ref 8.9–10.3)
Chloride: 101 mmol/L (ref 98–111)
Creatinine, Ser: 0.9 mg/dL (ref 0.44–1.00)
GFR calc Af Amer: >60 mL/min (ref >60)
GFR calc non Af Amer: >60 mL/min (ref >60)
Glucose, Bld: 124 mg/dL — ABNORMAL HIGH (ref 70–99)
Potassium: 3.2 mmol/L — ABNORMAL LOW (ref 3.5–5.1)
Sodium: 139 mmol/L (ref 135–145)
Total Bilirubin: 0.6 mg/dL (ref 0.3–1.2)
Total Protein: 7.8 g/dL (ref 6.5–8.1)

## 2020-07-17 LAB — SAMPLE TO BLOOD BANK

## 2020-07-17 MED ORDER — SODIUM CHLORIDE 0.9 % IV SOLN
150.0000 mg | Freq: Once | INTRAVENOUS | Status: AC
  Administered 2020-07-17: 150 mg via INTRAVENOUS
  Filled 2020-07-17: qty 150

## 2020-07-17 MED ORDER — SODIUM CHLORIDE 0.9% FLUSH
10.0000 mL | INTRAVENOUS | Status: DC | PRN
  Administered 2020-07-17: 10 mL
  Filled 2020-07-17: qty 10

## 2020-07-17 MED ORDER — HEPARIN SOD (PORK) LOCK FLUSH 100 UNIT/ML IV SOLN
500.0000 [IU] | Freq: Once | INTRAVENOUS | Status: AC | PRN
  Administered 2020-07-17: 500 [IU]
  Filled 2020-07-17: qty 5

## 2020-07-17 MED ORDER — SODIUM CHLORIDE 0.9 % IV SOLN
Freq: Once | INTRAVENOUS | Status: AC
  Filled 2020-07-17: qty 250

## 2020-07-17 MED ORDER — PALONOSETRON HCL INJECTION 0.25 MG/5ML
INTRAVENOUS | Status: AC
  Filled 2020-07-17: qty 5

## 2020-07-17 MED ORDER — ACETAMINOPHEN 325 MG PO TABS
650.0000 mg | ORAL_TABLET | Freq: Once | ORAL | Status: AC
  Administered 2020-07-17: 650 mg via ORAL

## 2020-07-17 MED ORDER — ACETAMINOPHEN 325 MG PO TABS
ORAL_TABLET | ORAL | Status: AC
  Filled 2020-07-17: qty 2

## 2020-07-17 MED ORDER — DIPHENHYDRAMINE HCL 50 MG/ML IJ SOLN
INTRAMUSCULAR | Status: AC
  Filled 2020-07-17: qty 1

## 2020-07-17 MED ORDER — SODIUM CHLORIDE 0.9 % IV SOLN
10.0000 mg | Freq: Once | INTRAVENOUS | Status: AC
  Administered 2020-07-17: 10 mg via INTRAVENOUS
  Filled 2020-07-17: qty 10

## 2020-07-17 MED ORDER — SODIUM CHLORIDE 0.9 % IV SOLN
INTRAVENOUS | Status: AC
  Filled 2020-07-17 (×2): qty 250

## 2020-07-17 MED ORDER — PALONOSETRON HCL INJECTION 0.25 MG/5ML
0.2500 mg | Freq: Once | INTRAVENOUS | Status: AC
  Administered 2020-07-17: 0.25 mg via INTRAVENOUS

## 2020-07-17 MED ORDER — DIPHENHYDRAMINE HCL 50 MG/ML IJ SOLN
50.0000 mg | Freq: Once | INTRAMUSCULAR | Status: AC
  Administered 2020-07-17: 50 mg via INTRAVENOUS

## 2020-07-17 MED ORDER — FLUCONAZOLE 200 MG PO TABS: 200.0000 mg | ORAL_TABLET | Freq: Every day | ORAL | 0 refills | Status: DC

## 2020-07-17 MED ORDER — FAMOTIDINE IN NACL 20-0.9 MG/50ML-% IV SOLN
INTRAVENOUS | Status: AC
  Filled 2020-07-17: qty 50

## 2020-07-17 MED ORDER — FAMOTIDINE IN NACL 20-0.9 MG/50ML-% IV SOLN
20.0000 mg | Freq: Once | INTRAVENOUS | Status: AC
  Administered 2020-07-17: 20 mg via INTRAVENOUS

## 2020-07-17 MED ORDER — SODIUM CHLORIDE 0.9 % IV SOLN
9.4000 mg/kg | Freq: Once | INTRAVENOUS | Status: AC
  Administered 2020-07-17: 900 mg via INTRAVENOUS
  Filled 2020-07-17: qty 90

## 2020-07-17 NOTE — Progress Notes (Signed)
[place]  [phone] [phone]   ID: Unknown Rebecca [name] DOB: 1976-05-05  MR#: [id number]  BMS#:[id number]  Patient Care Team: Dorena Dew, FNP as PCP - General (Family Medicine) Alphonsa Overall, MD as Consulting Physician (General Surgery) Magrinat, [name], MD as Consulting Physician (Oncology) Gery Pray, MD as Consulting Physician (Radiation Oncology) Delice Bison, Charlestine Massed, NP as Nurse Practitioner (Hematology and Oncology) Alda Berthold, DO as Consulting Physician (Neurology) OTHER MD:  CHIEF COMPLAINT: triple negative stage IV breast cancer  CURRENT TREATMENT: to start [name]   INTERVAL HISTORY: Rebecca [name] was scheduled today for follow-up and treatment of her triple negative breast cancer.  Her most recent CT scan was consistent with progression and she started treatment with [name] last week.  Today is cycle 1 day 8 of therapy.    She tolerated her first cycle of treatment well.  She notes some mild nausea today.  She also is mildly constipated.     REVIEW OF SYSTEMS: Rebecca [name] notes she didn't sleep well last night and so she is tired today.  She feels like she has a yeast infection.  She has not been drinking as much water as she could be.  She takes Tramadol about one tablet every day or every other day as needed.    Safina denies fever or chills.  She is without cough, shortness of breath, chest pain, or palpitations.  A detailed ROS was otherwise non contributory.      BREAST CANCER HISTORY: From the original intake note:  Rebecca [name] herself noted a change in her right breast sometime around September or October 2016. She did not bring it to intermediate medical attention, but on 01/13/2016 she established herself in Dr. Smith Robert' service and she was set up for bilateral diagnostic mammography with tomosynthesis and bilateral ultrasonography at the [facility] 01/19/2016. The breast density was category B. In the upper outer quadrant  of the right breast there was a spiculated mass measuring 2.8 cm. On physical exam this was palpable. Targeted ultrasonography confirmed an irregular hypoechoic mass in the right breast 11:30 o'clock position measuring 2.6 cm maximally. Ultrasound of the right axilla showed a morphologically abnormal lymph node.  In the left breast there were some tubular densities behind the areola which by ultrasonography showed benign ductal ectasia.  On 01/28/2016 Patrici underwent biopsy of the right breast mass and abnormal right axillary lymph node. The pathology from this procedure (S [id number]) showed the lymph node to be benign. In the breast however there was an invasive ductal carcinoma, grade 3, which was estrogen and progesterone receptor negative. The proliferation marker was 70%. HER-2 was not amplified with a signals ratio of 1.32. The number per cell was 2.05.  The patient's subsequent history is as detailed below   PAST MEDICAL HISTORY: Past Medical History:  Diagnosis Date  . Anxiety   . Breast cancer ([place])   . Depression   . GERD (gastroesophageal reflux disease)   . History of radiation therapy 11/15/16-01/12/17   right chest wall and axilla treated to 45 Gy in 25 fractions, boosted and additional 14 Gy in 8 fractions  . Hypertension    diet controlled  . Obesity (BMI 35.0-39.9 without comorbidity)   . Pneumonia    as a child  . Seasonal allergies   . Sickle cell trait ([place])   . Termination of pregnancy (fetus) 04/02/16    PAST SURGICAL HISTORY: Past Surgical History:  Procedure Laterality Date  . CESAREAN SECTION  2004 and 2007  . MASTECTOMY W/ SENTINEL NODE BIOPSY Right 09/06/2016   Procedure: RIGHT BREAST MASTECTOMY WITH RIGHT AXILLARY SENTINEL LYMPH NODE BIOPSY;  Surgeon: Alphonsa Overall, MD;  Location: [facility];  Service: General;  Laterality: Right;  . PORT-A-CATH REMOVAL Left 09/06/2016   Procedure: REMOVAL PORT-A-CATH;  Surgeon: Alphonsa Overall, MD;   Location: [place];  Service: General;  Laterality: Left;  . PORTACATH PLACEMENT    . PORTACATH PLACEMENT N/A 07/09/2019   Procedure: INSERTION PORT-A-CATH WITH ULTRASOUND;  Surgeon: Alphonsa Overall, MD;  Location: [facility] OR;  Service: General;  Laterality: N/A;    FAMILY HISTORY Family History  Problem Relation Age of Onset  . Hypertension Mother   . Cancer Mother        dx "intestinal cancer" in her 51s; +surgery  . Other Mother        hysterectomy at young age for unspecified cause  . Heart Problems Mother   . Breast cancer Cousin        maternal 1st cousin dx female breast cancer at 79-46y  . Cancer Father   . Hypertension Father   . Heart Problems Maternal Aunt   . Diabetes Maternal Aunt   . Breast cancer Maternal Uncle        dx 64-65  . Heart Problems Maternal Uncle   . Breast cancer Maternal Grandmother 72  . Throat cancer Maternal Grandfather        d. 38s; smoker  . Sickle cell anemia Paternal Aunt   . Congestive Heart Failure Maternal Aunt   . Multiple sclerosis Cousin   . Cancer Other        maternal great uncle (MGM's brother); cancer removed from his side  . Heart attack Paternal Aunt        d. early 38s  The patient has very little information about her father. Her mother is currently 39 years old. She had a history of cervical cancer at age 18. The patient had 2 brothers, no sisters. The maternal grandfather had throat cancer. A maternal uncle was diagnosed with breast cancer as well as prostate cancer at the age of 35. 2 maternal cousins, one of them female, had breast cancer as well.   GYNECOLOGIC HISTORY:  No LMP recorded. Menarche age 65, first live birth age 50. The patient is GX P4. She was still having regular periods at the time of diagnosis. She took oral contraceptives in the 1990s with no side effects.--.  Stopped with chemotherapy and have not resumed as of May 2019   SOCIAL HISTORY:  (Updated 10/2018) She works as a Market researcher. The  patient's significant other Dwayne [name] works at [employer].  At home with the patient are her 3 children Rebecca [name], [place] and [place]. There are age 86, 68, and 33 as of November 2019.  2 of them are disabled or have significant health problems, one with sickle cell disease, the other with autism and developmental delay.  The patient's son [name], currently 28 years old, lives in [place].    ADVANCED DIRECTIVES: Not in place   HEALTH MAINTENANCE: Social History   Tobacco Use  . Smoking status: Former Smoker    Packs/day: 1.00    Years: 20.00    Pack years: 20.00    Types: Cigarettes    Quit date: 04/01/2018    Years since quitting: 2.3  . Smokeless tobacco: Never Used  . Tobacco comment: Patient has quit  smoking x 1 year now  Vaping Use  . Vaping Use: Never used  Substance Use Topics  . Alcohol use: Yes    Comment: occ  . Drug use: No    Colonoscopy:  PAP:  Bone density:  Lipid panel:  No Known Allergies  Current Outpatient Medications on File Prior to Visit  Medication Sig Dispense Refill  . calcium carbonate (TUMS - DOSED IN MG ELEMENTAL CALCIUM) 500 MG chewable tablet Chew 1 tablet by mouth daily as needed for indigestion or heartburn.    . dexamethasone (DECADRON) 4 MG tablet Take 2 tablets (8 mg) daily for 3 days after chemotherapy. Take with food. 30 tablet 1  . doxycycline (VIBRA-TABS) 100 MG tablet Take 1 tablet (100 mg total) by mouth daily. 30 tablet 4  . lidocaine-prilocaine (EMLA) cream Apply to affected area once 30 g 3  . loperamide (IMODIUM A-D) 2 MG tablet Take 2 at diarrhea onset, then 1 every 2hr until 12hrs with no BM. May take 2 every 4hrs at night. If diarrhea recurs repeat. 100 tablet 1  . LORazepam (ATIVAN) 0.5 MG tablet Take 1 tablet (0.5 mg total) by mouth daily as needed for anxiety. 30 tablet 0  . prochlorperazine (COMPAZINE) 10 MG tablet Take 1 tablet (10 mg total) by mouth every 6 (six) hours as needed  (Nausea or vomiting). 30 tablet 1  . traMADol (ULTRAM) 50 MG tablet Take 1 tablet (50 mg total) by mouth every 6 (six) hours as needed. 30 tablet 0  . valACYclovir (VALTREX) 1000 MG tablet Take 1 tablet (1,000 mg total) by mouth daily. 90 tablet 1   No current facility-administered medications on file prior to visit.    OBJECTIVE: African-American woman  Vitals:   07/17/20 0833  BP: 112/72  Pulse: (!) 124  Resp: 17  Temp: 98.5 F (36.9 C)  SpO2: 100%   Wt Readings from Last 3 Encounters:  07/17/20 192 lb 8 oz (87.3 kg)  07/10/20 196 lb 14.4 oz (89.3 kg)  07/09/20 202 lb 13.2 oz (92 kg)   Body mass index is 35.78 kg/m.    ECOG FS: 2 GENERAL: Patient is a well appearing female in no acute distress HEENT:  Sclerae anicteric.  Mask in place. Neck is supple.  NODES:  No cervical, supraclavicular, or axillary lymphadenopathy palpated.  BREAST EXAM:  Deferred. LUNGS:  Clear to auscultation bilaterally.  No wheezes or rhonchi. HEART:  Regular rate and rhythm. No murmur appreciated. ABDOMEN:  Soft, nontender.  Positive, normoactive bowel sounds. No organomegaly palpated. MSK:  No focal spinal tenderness to palpation. Full range of motion bilaterally in the upper extremities. EXTREMITIES:  No peripheral edema.   SKIN:  Clear with no obvious rashes or skin changes. No nail dyscrasia. NEURO:  Nonfocal. Well oriented.  Appropriate affect.  Right chest wall 07/10/2020--at start of Trodelvy    Right breast 05/06/2020    Right chest wall area 11/22/2019, which is the new baseline   LAB RESULTS: CMP Latest Ref Rng & Units 07/17/2020 07/10/2020 06/09/2020  Glucose 70 - 99 mg/dL 124(H) 99 93  BUN 6 - 20 mg/dL _0 Creatinine 0.44 - 1.00 mg/dL 0.90 0.77 0.96  Sodium 135 - 145 mmol/L 139 141 142  Potassium 3.5 - 5.1 mmol/L 3.2(L) 3.6 3.3(L)  Chloride 98 - 111 mmol/L 101 106 104  CO2 22 - 32 mmol/L _1 Calcium 8.9 - 10.3 mg/dL 10.4(H) 9.7 9.6  Total Protein 6.5 - 8.1 g/dL 7.8  7.7  7.7  Total Bilirubin 0.3 - 1.2 mg/dL 0.6 0.3 0.3  Alkaline Phos 38 - 126 U/L 70 69 66  AST 15 - 41 U/L 11(L) 19 17  ALT 0 - 44 U/L _0 CBC    Component Value Date/Time   WBC 3.1 (L) 07/17/2020 0820   RBC 2.99 (L) 07/17/2020 0820   HGB 9.1 (L) 07/17/2020 0820   HGB 6.3 (LL) 11/08/2019 1350   HGB 10.4 (L) 10/04/2016 1154   HCT 27.2 (L) 07/17/2020 0820   HCT 31.3 (L) 10/04/2016 1154   PLT 158 07/17/2020 0820   PLT 10 (L) 11/08/2019 1350   PLT 320 10/04/2016 1154   MCV 91.0 07/17/2020 0820   MCV 95.7 10/04/2016 1154   MCH 30.4 07/17/2020 0820   MCHC 33.5 07/17/2020 0820   RDW 16.8 (H) 07/17/2020 0820   RDW 16.3 (H) 10/04/2016 1154   LYMPHSABS 1.2 07/17/2020 0820   LYMPHSABS 1.7 10/04/2016 1154   MONOABS 0.7 07/17/2020 0820   MONOABS 0.5 10/04/2016 1154   EOSABS 0.0 07/17/2020 0820   EOSABS 0.3 10/04/2016 1154   BASOSABS 0.0 07/17/2020 0820   BASOSABS 0.0 10/04/2016 1154    STUDIES: DG Chest 2 View  Result Date: 07/09/2020 CLINICAL DATA:  Chest pain EXAM: CHEST - 2 VIEW COMPARISON:  06/26/2020 CT chest. 07/09/2019 chest radiograph and prior. FINDINGS: Cardiomediastinal silhouette within normal limits. Minimal left basilar opacities, likely atelectasis. No pneumothorax or pleural effusion. No acute osseous abnormality. Left chest wall Port-A-Cath tip overlies the upper right atrium. IMPRESSION: Minimal left basilar opacities, likely atelectasis. No acute airspace disease. Electronically Signed   By: Primitivo Gauze M.D.   On: 07/09/2020 13:22   CT Soft Tissue Neck W Contrast  Result Date: 06/27/2020 CLINICAL DATA:  Metastatic breast cancer with difficulty swallowing. EXAM: CT NECK WITH CONTRAST TECHNIQUE: Multidetector CT imaging of the neck was performed using the standard protocol following the bolus administration of intravenous contrast. CONTRAST:  57m OMNIPAQUE IOHEXOL 300 MG/ML  SOLN COMPARISON:  None. FINDINGS: Pharynx and larynx: No evidence of mass or  swelling. No mass effect on the airway. Salivary glands: No inflammation, mass, or stone. Thyroid: Normal. Lymph nodes: None enlarged in the neck. Vascular: Partially covered left subclavian porta catheter. Limited intracranial: Negative Visualized orbits: Partial coverage is negative Mastoids and visualized paranasal sinuses: Clear. Skeleton: No evidence of metastatic disease. Cervical endplate spurring. Upper chest: Reported on chest CT from the same day IMPRESSION: No evidence of metastatic disease to the neck. No explanation for difficulty swallowing. Electronically Signed   By: JMonte FantasiaM.D.   On: 06/27/2020 16:26   CT Chest W Contrast  Result Date: 06/26/2020 CLINICAL DATA:  Breast cancer staging, history of mastectomy with recurrence, status post chemo radiotherapy. EXAM: CT CHEST WITH CONTRAST TECHNIQUE: Multidetector CT imaging of the chest was performed during intravenous contrast administration. CONTRAST:  741mOMNIPAQUE IOHEXOL 300 MG/ML  SOLN COMPARISON:  CT chest 04/02/2020 FINDINGS: Cardiovascular: Normal caliber thoracic aorta. Central pulmonary vasculature on venous phase assessment is unremarkable. Heart size is normal. No pericardial effusion. LEFT-sided Port-A-Cath terminating at the caval to atrial junction. Mediastinum/Nodes: Bulky LEFT axillary adenopathy with perhaps slight interval increase the interval (16, series 2) 16 mm LEFT subpectoral lymph node previously approximately 14-15 mm. Short axis dimension of bulky LEFT axillary adenopathy (image 38, series 2) 2.4 cm previously, now 2.7 cm. The area is limited with respect overall assessment however given incomplete imaging of this location. Another  high LEFT axillary lymph node measuring 13 mm (image 21, series 2) previously approximately 9 mm short axis. RIGHT axillary nodal enlargement (image 36, series 2) 1 cm short axis previously 11 mm. Anterior mediastinal soft tissue along the internal mammary chain (image 28, series 2) 11  mm, previously 7 mm. Expansion of RIGHT pectoralis major and soft tissue along its inferior extent may be slightly diminished, difficult to measure given infiltrative nature. Ulcerated area, at the site of previous chest wall soft tissue with less soft tissue and now frank ulceration in the area along the inferior margin of the pectoralis and RIGHT anterior chest wall. Plaque-like area of soft tissue thickening previously measured up to 13 mm, above the area of ulceration where there is still dermal thickening is approximately 5 mm on today's exam. No hilar adenopathy.  No mediastinal adenopathy. Lungs/Pleura: No consolidation. No pleural effusion. Pleural thickening along the RIGHT anterior chest wall. Scarring in the RIGHT middle lobe. No suspicious mass or nodule in the chest. Upper Abdomen: Hepatic steatosis with lobular hepatic contours. Limited assessment of the liver without focal, suspicious lesion. No pericholecystic stranding. Gallbladder incompletely imaged. Adrenal glands are unremarkable with respect to imaged portions. No acute upper abdominal process. Musculoskeletal: Chest wall findings and axillary findings described in detail above. No destructive bone finding. No acute bone process. IMPRESSION: 1. Bulky LEFT axillary adenopathy with perhaps slight interval increase the interval, also associated with RIGHT internal mammary nodal enlargement that may show very slight increase when compared to the previous study, attention on follow-up. 2. Expansion of RIGHT pectoralis musculature may be slightly improved and the amount of soft tissue in the area over the RIGHT chest wall has diminished though there is now frank ulceration at the site of soft tissue thickening seen on the previous exam overlying inferior aspect of the pectoralis muscle. 3. Hepatic steatosis with lobular hepatic contours. Findings raise the question of liver disease/early cirrhosis. 4. Aortic atherosclerosis. Aortic Atherosclerosis  (ICD10-I70.0). Electronically Signed   By: Zetta Bills M.D.   On: 06/26/2020 12:19   DG Abd 2 Views  Result Date: 07/17/2020 CLINICAL DATA:  History of constipation EXAM: ABDOMEN - 2 VIEW COMPARISON:  None. FINDINGS: Scattered large and small bowel gas is noted. Mild retained fecal material is seen. No obstructive changes are noted. Mild degenerative change of the lumbar spine is seen. No free air is noted. No other focal abnormality is seen. IMPRESSION: Mild retained fecal material without obstructive change. Electronically Signed   By: Inez Catalina M.D.   On: 07/17/2020 15:15     RESEARCH: Referred to PREVENT study, but was pregnant at the time; referred to [facility] 11202, but the biopsied lymph node was benign; referred to weight loss study but declined; referred to [facility] 12/09/2000, but the timing of radiation and 8 was greater than 60 days past the date of diagnosis; referred to health disparity study, but declined; referred to [id number] adjuvant therapy study, but the patient received Xeloda with radiation and therefore was ineligible   ASSESSMENT: 44 y.o. [place] woman status post right breast upper-outer quadrant biopsy 01/28/2016 for a clinical T2 N0 invasive ductal carcinoma, grade 3, triple negative, with an MIB-1 of 70%.  (a) suspicious right axillary lymph node biopsied 01/28/2016 was benign  (1) neoadjuvant chemotherapy: doxorubicin and cyclophosphamide in dose dense fashion 4 started 04/14/16, completed 05/26/2016, followed by paclitaxel and carboplatin weekly 12, Started 06/09/2016  (a) taxol discontinued after 7 doses because of neuropathy, last dose 07/21/2016  (  2) genetics testing October 20, 2016 through the 32-gene Comprehensive Cancer Panel offered by [facility] Alvera Singh, MD) (with MSH2 Exons 1-7 Inversion Analysis) found no deleterious mutations or VUSS  In APC, ATM, AXIN2, BARD1, BMPR1A, BRCA1, BRCA2, BRIP1, CDH1, CDK4, CDKN2A, CHEK2, EPCAM, FANCC,  MLH1, MSH2, MSH6, MUTYH, NBN, PALB2, PMS2, POLD1, POLE, PTEN, RAD51C, RAD51D, SCG5/GREM1, SMAD4, STK11, TP53, VHL, and XRCC2.    (3) right mastectomy and sentinel lymph node sampling 09/06/2016 showed a residual ypT1c ypN0, invasive ductal carcinoma, grade 3, with negative margins. Repeat prognostic panel again triple negative   (4) adjuvant radiation with capecitabine/Xeloda sensitization 11/15/16 - 01/12/17 Site/dose:   Right Chest Wall and axilla (4 field) treated to 45 Gy in 25 fractions, and then Boosted an additional 14.4 Gy in 8 fractions.  (5) tobacco abuse disorder: The patient quit smoking 04/04/2018  METASTATIC DISEASE:  July 2020: chest wall, bones, nodes (6) nonspecific changes noted on chest CT 06/11/2019 were clarified by PET scan 06/19/2019 showing hypermetabolic disease in the right anterior chest wall, right internal mammary nodes, right and left axillary nodes, but no metastatic disease in the neck, lungs, abdomen or pelvis.  Bone marrow uptake suggests bony metastatic disease.  (a) CARIS requested obtained from 06/28/2019 sample confirmed a triple negativity, the tumor also was negative for the androgen receptor, was MSI stable and mismatch repair status proficient, with a low mutational burden.  BRCA 1 and 2 were negative and PD-L1 was negative.  PI K3 showed a variant of uncertain significance.  However the tumor was genomic LOH high  (b) CA-27-29 is informative: was 118.3 on 06/04/2019  (7) zoledronate started 07/17/2019--on hold currently due to dental issues, and until 6 weeks at least after dental work  (8) carboplatin/ gemcitabine days 1 and 8 Q21 day cycle started 07/10/2019, last dose 10/30/2019  (a) restaging studies 09/2019 showed no progression  (b) restaging studies after 6 cycles showed mild disease progression  (9) cyclophosphamide, methotrexate, fluorouracil chemotherapy started 11/13/2019, repeated every 21 days.  (a) discontinued after cycle 3 (12/31/2019):  No evidence of response  (10) started capecitabine 01/21/2020, given 1 week on and 1 week off at standard doses (1500 mg twice daily)   (a) Discontinued after almost three months of treatment (last on 04/06/2020)  (b) Disease progression on 4/27 CT scan showing increasing right chest wall disease, left breast lesions, and ? Liver involvement.  (c) MRI liver 04/14/2020 shows no liver invovlement  (11) Started liposomal doxorubicin/Doxil given on day 1 of a 21 day cycle on 04/07/2020  (a) echo on 04/02/2020 shows EF of 50-55%, repeat in 06/2020  (b) chest CT scan after 4 cycles of Doxil shows some evidence of progression in left axillary and right internal mammary adenopathy  (c) Doxil  after 06/09/2020 dose  (12) to start sacituzumab govitecan/ Trodelvy 07/10/2020 given on day 1 and 8 of a 21 day cycle  (a) Udenyca added on day 11 to prevent treatment related neutropenia delaying chemotherapy    PLAN: Laniesha is doing moderately well today.  She has not signs of metastatic breast cancer progression.  She tolerated her first treatment with Trodelvy moderately well last week.  She has had some issues, and I am listing these in numerical order below:  1. Vaginal Candida: Will treat with Fluconazole daily.    2. ANC of 1.2:  She is ok to proceed with treatment today.  I have added Udenyca to her day 11 of treatment to return on Monday to receive this  to prevent treatment related neutropenia that can delay her therapy.    3. Cancer related pain: She has a fungating tumor at her chest wall.  I did not image this or examine this today.  She takes Tramadol as needed for the pain.  Her pain goal is to improve her functioning, and she takes tramadol daily to every other day.  She last received this about a week ago, and her PMP aware has been reviewed with no red flags noted.    4. Nausea: We reviewed how to take her anti emetics.   She will also receive some IV today.    5. Constipation: due to nausea and  constipation, she will undergo plain films of her abdomen today.  Her abdominal exam is reassuring, and I do not suspect an acute abdomen.  We discussed using stool softeners and laxatives to help prevent constipation, particularly since she is taking Tramadol and anti nausea medications.    Mahogani and I reviewed the above in detail.  She will return in two weeks for labs, f/u and her next treatment.  She knows to call between now and then for any other concerns.     Total encounter time: 30 minutes*  Wilber Bihari, NP 07/19/20 9:08 AM Medical Oncology and Hematology [facility] [place],  [address] Tel. [phone]    Fax. [phone]  *Total Encounter Time as defined by the Centers for Medicare and Medicaid Services includes, in addition to the face-to-face time of a patient visit (documented in the note above) non-face-to-face time: obtaining and reviewing outside history, ordering and reviewing medications, tests or procedures, care coordination (communications with other health care professionals or caregivers) and documentation in the medical record.

## 2020-07-17 NOTE — Patient Instructions (Signed)
[facility] Discharge Instructions for Patients Receiving Chemotherapy  Today you received the following chemotherapy agents Trodelvy  To help prevent nausea and vomiting after your treatment, we encourage you to take your nausea medication as directed.    If you develop nausea and vomiting that is not controlled by your nausea medication, call the clinic.   BELOW ARE SYMPTOMS THAT SHOULD BE REPORTED IMMEDIATELY:  *FEVER GREATER THAN 100.5 F  *CHILLS WITH OR WITHOUT FEVER  NAUSEA AND VOMITING THAT IS NOT CONTROLLED WITH YOUR NAUSEA MEDICATION  *UNUSUAL SHORTNESS OF BREATH  *UNUSUAL BRUISING OR BLEEDING  TENDERNESS IN MOUTH AND THROAT WITH OR WITHOUT PRESENCE OF ULCERS  *URINARY PROBLEMS  *BOWEL PROBLEMS  UNUSUAL RASH Items with * indicate a potential emergency and should be followed up as soon as possible.  Feel free to call the clinic should you have any questions or concerns. The clinic phone number is [phone].  Please show the CHEMO ALERT CARD at check-in to the Emergency Department and triage nurse.   

## 2020-07-17 NOTE — Progress Notes (Signed)
Per Wilber Bihari, OK to treat with ANC 1.2 and pulse 108.

## 2020-07-17 NOTE — Progress Notes (Signed)
07/17/20  New weight: 87.3 kg dose calculates out to 900 mg treatment.  Today's dose and future plans updated to reflect >10% weight loss (87.3kg)  Ok per Wilber Bihari, NP  Henreitta Leber, PharmD

## 2020-07-20 ENCOUNTER — Telehealth: Payer: Self-pay | Admitting: Adult Health

## 2020-07-20 ENCOUNTER — Telehealth: Payer: Self-pay

## 2020-07-20 ENCOUNTER — Ambulatory Visit: Payer: Medicaid Other

## 2020-07-20 NOTE — Telephone Encounter (Signed)
TC to pt per Wilber Bihari NP to let her know that her xray shows constipation. Recommend she take miralax daily and have a few bowel movements. Also verified with pharmacy that patient was approved for Udenyca. Sent schedule message to get pt scheduled to receive udenyca injection today @1pm  per Wilber Bihari NP. Pt aware of appointment date and time.

## 2020-07-20 NOTE — Telephone Encounter (Signed)
Scheduled appts per 8/13 los. Pt confirmed appt date and time.

## 2020-07-21 ENCOUNTER — Other Ambulatory Visit: Payer: Self-pay

## 2020-07-21 ENCOUNTER — Inpatient Hospital Stay: Payer: Medicaid Other

## 2020-07-21 VITALS — BP 107/77 | HR 99 | Temp 98.4°F | Resp 18

## 2020-07-21 DIAGNOSIS — Z7189 Other specified counseling: Secondary | ICD-10-CM

## 2020-07-21 DIAGNOSIS — C50411 Malignant neoplasm of upper-outer quadrant of right female breast: Secondary | ICD-10-CM

## 2020-07-21 DIAGNOSIS — C7951 Secondary malignant neoplasm of bone: Secondary | ICD-10-CM

## 2020-07-21 DIAGNOSIS — Z171 Estrogen receptor negative status [ER-]: Secondary | ICD-10-CM

## 2020-07-21 DIAGNOSIS — Z5112 Encounter for antineoplastic immunotherapy: Secondary | ICD-10-CM | POA: Diagnosis not present

## 2020-07-21 DIAGNOSIS — C50919 Malignant neoplasm of unspecified site of unspecified female breast: Secondary | ICD-10-CM

## 2020-07-21 MED ORDER — PEGFILGRASTIM-CBQV 6 MG/0.6ML ~~LOC~~ SOSY
PREFILLED_SYRINGE | SUBCUTANEOUS | Status: AC
  Filled 2020-07-21: qty 0.6

## 2020-07-21 MED ORDER — PEGFILGRASTIM-CBQV 6 MG/0.6ML ~~LOC~~ SOSY
6.0000 mg | PREFILLED_SYRINGE | Freq: Once | SUBCUTANEOUS | Status: AC
  Administered 2020-07-21: 6 mg via SUBCUTANEOUS

## 2020-07-31 ENCOUNTER — Other Ambulatory Visit: Payer: Self-pay | Admitting: *Deleted

## 2020-07-31 ENCOUNTER — Inpatient Hospital Stay (HOSPITAL_BASED_OUTPATIENT_CLINIC_OR_DEPARTMENT_OTHER): Payer: Medicaid Other | Admitting: Adult Health

## 2020-07-31 ENCOUNTER — Inpatient Hospital Stay: Payer: Medicaid Other

## 2020-07-31 ENCOUNTER — Other Ambulatory Visit: Payer: Self-pay

## 2020-07-31 ENCOUNTER — Telehealth: Payer: Self-pay

## 2020-07-31 DIAGNOSIS — D6481 Anemia due to antineoplastic chemotherapy: Secondary | ICD-10-CM

## 2020-07-31 DIAGNOSIS — C50919 Malignant neoplasm of unspecified site of unspecified female breast: Secondary | ICD-10-CM

## 2020-07-31 DIAGNOSIS — T451X5A Adverse effect of antineoplastic and immunosuppressive drugs, initial encounter: Secondary | ICD-10-CM

## 2020-07-31 DIAGNOSIS — C50411 Malignant neoplasm of upper-outer quadrant of right female breast: Secondary | ICD-10-CM | POA: Diagnosis not present

## 2020-07-31 DIAGNOSIS — Z171 Estrogen receptor negative status [ER-]: Secondary | ICD-10-CM

## 2020-07-31 DIAGNOSIS — C7951 Secondary malignant neoplasm of bone: Secondary | ICD-10-CM

## 2020-07-31 DIAGNOSIS — Z5112 Encounter for antineoplastic immunotherapy: Secondary | ICD-10-CM | POA: Diagnosis not present

## 2020-07-31 LAB — CBC WITH DIFFERENTIAL/PLATELET
Abs Immature Granulocytes: 0.5 10*3/uL — ABNORMAL HIGH (ref 0.00–0.07)
Basophils Absolute: 0.1 10*3/uL (ref 0.0–0.1)
Basophils Relative: 0 %
Eosinophils Absolute: 0.1 10*3/uL (ref 0.0–0.5)
Eosinophils Relative: 1 %
HCT: 23.3 % — ABNORMAL LOW (ref 36.0–46.0)
Hemoglobin: 7.9 g/dL — ABNORMAL LOW (ref 12.0–15.0)
Immature Granulocytes: 3 %
Lymphocytes Relative: 11 %
Lymphs Abs: 1.6 10*3/uL (ref 0.7–4.0)
MCH: 30.2 pg (ref 26.0–34.0)
MCHC: 33.9 g/dL (ref 30.0–36.0)
MCV: 88.9 fL (ref 80.0–100.0)
Monocytes Absolute: 2.5 10*3/uL — ABNORMAL HIGH (ref 0.1–1.0)
Monocytes Relative: 17 %
Neutro Abs: 10.4 10*3/uL — ABNORMAL HIGH (ref 1.7–7.7)
Neutrophils Relative %: 68 %
Platelets: 210 10*3/uL (ref 150–400)
RBC: 2.62 MIL/uL — ABNORMAL LOW (ref 3.87–5.11)
RDW: 18.1 % — ABNORMAL HIGH (ref 11.5–15.5)
WBC: 15.1 10*3/uL — ABNORMAL HIGH (ref 4.0–10.5)
nRBC: 0.3 % — ABNORMAL HIGH (ref 0.0–0.2)

## 2020-07-31 LAB — COMPREHENSIVE METABOLIC PANEL
ALT: 15 U/L (ref 0–44)
AST: 15 U/L (ref 15–41)
Albumin: 3.5 g/dL (ref 3.5–5.0)
Alkaline Phosphatase: 103 U/L (ref 38–126)
Anion gap: 9 (ref 5–15)
BUN: 7 mg/dL (ref 6–20)
CO2: 31 mmol/L (ref 22–32)
Calcium: 9.6 mg/dL (ref 8.9–10.3)
Chloride: 99 mmol/L (ref 98–111)
Creatinine, Ser: 0.78 mg/dL (ref 0.44–1.00)
GFR calc Af Amer: >60 mL/min (ref >60)
GFR calc non Af Amer: >60 mL/min (ref >60)
Glucose, Bld: 99 mg/dL (ref 70–99)
Potassium: 3.3 mmol/L — ABNORMAL LOW (ref 3.5–5.1)
Sodium: 139 mmol/L (ref 135–145)
Total Bilirubin: 0.2 mg/dL — ABNORMAL LOW (ref 0.3–1.2)
Total Protein: 7.6 g/dL (ref 6.5–8.1)

## 2020-07-31 LAB — RETICULOCYTES
Immature Retic Fract: 31.6 % — ABNORMAL HIGH (ref 2.3–15.9)
RBC.: 2.61 MIL/uL — ABNORMAL LOW (ref 3.87–5.11)
Retic Count, Absolute: 45.9 10*3/uL (ref 19.0–186.0)
Retic Ct Pct: 1.8 % (ref 0.4–3.1)

## 2020-07-31 LAB — FERRITIN: Ferritin: 1764 ng/mL — ABNORMAL HIGH (ref 11–307)

## 2020-07-31 LAB — PREPARE RBC (CROSSMATCH)

## 2020-07-31 MED ORDER — HEPARIN SOD (PORK) LOCK FLUSH 100 UNIT/ML IV SOLN
500.0000 [IU] | Freq: Every day | INTRAVENOUS | Status: AC | PRN
  Administered 2020-07-31: 500 [IU]
  Filled 2020-07-31: qty 5

## 2020-07-31 MED ORDER — DIPHENHYDRAMINE HCL 25 MG PO CAPS
ORAL_CAPSULE | ORAL | Status: AC
  Filled 2020-07-31: qty 1

## 2020-07-31 MED ORDER — SODIUM CHLORIDE 0.9% FLUSH
10.0000 mL | INTRAVENOUS | Status: AC | PRN
  Administered 2020-07-31: 10 mL
  Filled 2020-07-31: qty 10

## 2020-07-31 MED ORDER — MORPHINE SULFATE (PF) 2 MG/ML IV SOLN
1.0000 mg | Freq: Once | INTRAVENOUS | Status: AC
  Administered 2020-07-31: 1 mg via INTRAVENOUS

## 2020-07-31 MED ORDER — MORPHINE SULFATE (PF) 2 MG/ML IV SOLN
INTRAVENOUS | Status: AC
  Filled 2020-07-31: qty 1

## 2020-07-31 MED ORDER — ACETAMINOPHEN 325 MG PO TABS
650.0000 mg | ORAL_TABLET | Freq: Once | ORAL | Status: AC
  Administered 2020-07-31: 650 mg via ORAL

## 2020-07-31 MED ORDER — SODIUM CHLORIDE 0.9% IV SOLUTION
250.0000 mL | Freq: Once | INTRAVENOUS | Status: AC
  Administered 2020-07-31: 250 mL via INTRAVENOUS
  Filled 2020-07-31: qty 250

## 2020-07-31 MED ORDER — FLUCONAZOLE 200 MG PO TABS: 200.0000 mg | ORAL_TABLET | Freq: Every day | ORAL | 0 refills | Status: DC

## 2020-07-31 MED ORDER — ACETAMINOPHEN 325 MG PO TABS
ORAL_TABLET | ORAL | Status: AC
  Filled 2020-07-31: qty 2

## 2020-07-31 MED ORDER — DIPHENHYDRAMINE HCL 25 MG PO CAPS
25.0000 mg | ORAL_CAPSULE | Freq: Once | ORAL | Status: AC
  Administered 2020-07-31: 25 mg via ORAL

## 2020-07-31 MED ORDER — SODIUM CHLORIDE 0.9% FLUSH
10.0000 mL | INTRAVENOUS | Status: DC | PRN
  Administered 2020-07-31: 10 mL
  Filled 2020-07-31: qty 10

## 2020-07-31 MED ORDER — OXYCODONE HCL 5 MG PO TABS: 5.0000 mg | ORAL_TABLET | ORAL | 0 refills | Status: DC | PRN

## 2020-07-31 NOTE — Patient Instructions (Signed)

## 2020-07-31 NOTE — Telephone Encounter (Signed)
Patient requested refill for Tramadol and Diflucan. Advised patient Per Mendel Ryder, NP: "Tell her not to take Tramadol. I sent in Oxycodone since that is helping her pain better. I will send in diflucan. if she is still having vaginal issues, would recommend seeing gyn for it." Patient was agreeable with this.

## 2020-07-31 NOTE — Progress Notes (Signed)
[facility]  [phone] [phone]   ID: Rebecca [name] DOB: 09-14-1976  MR#: [id number]  JAS#:[id number]  Patient Care Team: Dorena Dew, FNP as PCP - General (Family Medicine) Alphonsa Overall, Rebecca [name] as Consulting Physician (General Surgery) Rebecca [name], Rebecca Dad, Rebecca [name] as Consulting Physician (Oncology) Gery Pray, Rebecca [name] as Consulting Physician (Radiation Oncology) Delice Bison, Charlestine Massed, NP as Nurse Practitioner (Hematology and Oncology) Alda Berthold, DO as Consulting Physician (Neurology) OTHER Rebecca [name]:  CHIEF COMPLAINT: triple negative stage IV breast cancer  CURRENT TREATMENT: to start Rebecca [name]   INTERVAL HISTORY: Rebecca [name] was scheduled today for follow-up and treatment of her triple negative breast cancer.  She has received one full cycle of Rebecca [name] thus far.  Today she is cycle 2 day 1 of therapy.  Rebecca [name] does not want to receive therapy today.  She notes that her chest wall pain is worsening, her wounds are worsening, and she does not believe the treatment is working.     REVIEW OF SYSTEMS: Rebecca [name] is not feeling well today.  Our visit happened in the infusion room because there were delays in getting her labs drawn.  Rebecca [name] is tearful.  She notes that the IV therapy is not helping her like she thought that it would.  She normally takes tramadol for pain, however when she takes it, it doesn't help relieve her pain.  She says that yesterday, her pain was so significant that she took oxycodone that belonged to her son and that seemed to help.  Rebecca [name] notes that her wounds seem to be worsening.  Unfortunately, I cannot examine these today because she is in the infusion room.  She also says that she is constipated.  Otherwise, a detailed ROS Was non contributory today.      BREAST CANCER HISTORY: From the original intake note:  Rebecca [name] noted a change in her right breast sometime around September or October 2016. She did not bring it to intermediate  medical attention, but on 01/13/2016 she established [name] in Dr. Smith Robert' service and she was set up for bilateral diagnostic mammography with tomosynthesis and bilateral ultrasonography at the [facility] 01/19/2016. The breast density was category B. In the upper outer quadrant of the right breast there was a spiculated mass measuring 2.8 cm. On physical exam this was palpable. Targeted ultrasonography confirmed an irregular hypoechoic mass in the right breast 11:30 o'clock position measuring 2.6 cm maximally. Ultrasound of the right axilla showed a morphologically abnormal lymph node.  In the left breast there were some tubular densities behind the areola which by ultrasonography showed benign ductal ectasia.  On 01/28/2016 [name] underwent biopsy of the right breast mass and abnormal right axillary lymph node. The pathology from this procedure (S [id number]) showed the lymph node to be benign. In the breast however there was an invasive ductal carcinoma, grade 3, which was estrogen and progesterone receptor negative. The proliferation marker was 70%. HER-2 was not amplified with a signals ratio of 1.32. The number per cell was 2.05.  The patient's subsequent history is as detailed below   PAST MEDICAL HISTORY: Past Medical History:  Diagnosis Date  . Anxiety   . Breast cancer ([place])   . Depression   . GERD (gastroesophageal reflux disease)   . History of radiation therapy 11/15/16-01/12/17   right chest wall and axilla treated to 45 Gy in 25 fractions, boosted and additional 14 Gy in 8 fractions  . Hypertension    diet controlled  . Obesity (  BMI 35.0-39.9 without comorbidity)   . Pneumonia    as a child  . Seasonal allergies   . Sickle cell trait ([place])   . Termination of pregnancy (fetus) 04/02/16    PAST SURGICAL HISTORY: Past Surgical History:  Procedure Laterality Date  . CESAREAN SECTION     2004 and 2007  . MASTECTOMY W/ SENTINEL NODE BIOPSY Right 09/06/2016   Procedure:  RIGHT BREAST MASTECTOMY WITH RIGHT AXILLARY SENTINEL LYMPH NODE BIOPSY;  Surgeon: Alphonsa Overall, Rebecca [name];  Location: [facility];  Service: General;  Laterality: Right;  . PORT-A-CATH REMOVAL Left 09/06/2016   Procedure: REMOVAL PORT-A-CATH;  Surgeon: Alphonsa Overall, Rebecca [name];  Location: [facility];  Service: General;  Laterality: Left;  . PORTACATH PLACEMENT    . PORTACATH PLACEMENT N/A 07/09/2019   Procedure: INSERTION PORT-A-CATH WITH ULTRASOUND;  Surgeon: Alphonsa Overall, Rebecca [name];  Location: [facility] OR;  Service: General;  Laterality: N/A;    FAMILY HISTORY Family History  Problem Relation Age of Onset  . Hypertension Mother   . Cancer Mother        dx "intestinal cancer" in her 74s; +surgery  . Other Mother        hysterectomy at young age for unspecified cause  . Heart Problems Mother   . Breast cancer Cousin        maternal 1st cousin dx female breast cancer at 10-46y  . Cancer Father   . Hypertension Father   . Heart Problems Maternal Aunt   . Diabetes Maternal Aunt   . Breast cancer Maternal Uncle        dx 64-65  . Heart Problems Maternal Uncle   . Breast cancer Maternal Grandmother 49  . Throat cancer Maternal Grandfather        d. 36s; smoker  . Sickle cell anemia Paternal Aunt   . Congestive Heart Failure Maternal Aunt   . Multiple sclerosis Cousin   . Cancer Other        maternal great uncle (MGM's brother); cancer removed from his side  . Heart attack Paternal Aunt        d. early 92s  The patient has very little information about her father. Her mother is currently 71 years old. She had a history of cervical cancer at age 50. The patient had 2 brothers, no sisters. The maternal grandfather had throat cancer. A maternal uncle was diagnosed with breast cancer as well as prostate cancer at the age of 59. 2 maternal cousins, one of them female, had breast cancer as well.   GYNECOLOGIC HISTORY:  No LMP recorded. Menarche age 70, first live birth age 53. The patient is  GX P4. She was still having regular periods at the time of diagnosis. She took oral contraceptives in the 1990s with no side effects.--.  Stopped with chemotherapy and have not resumed as of May 2019   SOCIAL HISTORY:  (Updated 10/2018) She works as a Market researcher. The patient's significant other [name] works at [employer].  At home with the patient are her 3 children [name], [place] and [place]. There are age 88, 74, and 85 as of November 2019.  2 of them are disabled or have significant health problems, one with sickle cell disease, the other with autism and developmental delay.  The patient's son [name], currently 37 years old, lives in [place].    ADVANCED DIRECTIVES: Not in place   HEALTH MAINTENANCE: Social History   Tobacco  Use  . Smoking status: Former Smoker    Packs/day: 1.00    Years: 20.00    Pack years: 20.00    Types: Cigarettes    Quit date: 04/01/2018    Years since quitting: 2.3  . Smokeless tobacco: Never Used  . Tobacco comment: Patient has quit smoking x 1 year now  Vaping Use  . Vaping Use: Never used  Substance Use Topics  . Alcohol use: Yes    Comment: occ  . Drug use: No    Colonoscopy:  PAP:  Bone density:  Lipid panel:  No Known Allergies  Current Outpatient Medications on File Prior to Visit  Medication Sig Dispense Refill  . calcium carbonate (TUMS - DOSED IN MG ELEMENTAL CALCIUM) 500 MG chewable tablet Chew 1 tablet by mouth daily as needed for indigestion or heartburn.    . dexamethasone (DECADRON) 4 MG tablet Take 2 tablets (8 mg) daily for 3 days after chemotherapy. Take with food. 30 tablet 1  . doxycycline (VIBRA-TABS) 100 MG tablet Take 1 tablet (100 mg total) by mouth daily. 30 tablet 4  . fluconazole (DIFLUCAN) 200 MG tablet Take 1 tablet (200 mg total) by mouth daily. 7 tablet 0  . lidocaine-prilocaine (EMLA) cream Apply to affected area once 30 g 3  . loperamide (IMODIUM A-D) 2 MG  tablet Take 2 at diarrhea onset, then 1 every 2hr until 12hrs with no BM. May take 2 every 4hrs at night. If diarrhea recurs repeat. 100 tablet 1  . LORazepam (ATIVAN) 0.5 MG tablet Take 1 tablet (0.5 mg total) by mouth daily as needed for anxiety. 30 tablet 0  . prochlorperazine (COMPAZINE) 10 MG tablet Take 1 tablet (10 mg total) by mouth every 6 (six) hours as needed (Nausea or vomiting). 30 tablet 1  . traMADol (ULTRAM) 50 MG tablet Take 1 tablet (50 mg total) by mouth every 6 (six) hours as needed. 30 tablet 0  . valACYclovir (VALTREX) 1000 MG tablet Take 1 tablet (1,000 mg total) by mouth daily. 90 tablet 1   No current facility-administered medications on file prior to visit.    OBJECTIVE: African-American woman See CHL for vitals There were no vitals filed for this visit. Wt Readings from Last 3 Encounters:  07/17/20 192 lb 8 oz (87.3 kg)  07/10/20 196 lb 14.4 oz (89.3 kg)  07/09/20 202 lb 13.2 oz (92 kg)   There is no height or weight on file to calculate BMI.    ECOG FS: 2 GENERAL: Patient is non-toxic, however in obvious pain HEENT:  Sclerae anicteric.  Mask in place. Neck is supple.  NODES:  No cervical, supraclavicular, or axillary lymphadenopathy palpated.  BREAST EXAM:  Deferred. LUNGS:  Clear to auscultation bilaterally.  No wheezes or rhonchi. HEART:  Regular rate and rhythm. No murmur appreciated. ABDOMEN:  Soft, nontender.  Positive, normoactive bowel sounds. No organomegaly palpated. MSK:  No focal spinal tenderness to palpation.  EXTREMITIES:  No peripheral edema.   SKIN:  Clear with no obvious rashes or skin changes. No nail dyscrasia. NEURO:  Nonfocal. Well oriented.  Appropriate affect.  Right chest wall 07/10/2020--at start of Rebecca [name]    Right breast 05/06/2020    Right chest wall area 11/22/2019, which is the new baseline   LAB RESULTS: CMP Latest Ref Rng & Units 07/17/2020 07/10/2020 06/09/2020  Glucose 70 - 99 mg/dL 124(H) 99 93  BUN 6 - 20 mg/dL _0 Creatinine 0.44 - 1.00 mg/dL 0.90 0.77 0.96  Sodium 135 - 145 mmol/L 139 141 142  Potassium 3.5 - 5.1 mmol/L 3.2(L) 3.6 3.3(L)  Chloride 98 - 111 mmol/L 101 106 104  CO2 22 - 32 mmol/L _0 Calcium 8.9 - 10.3 mg/dL 10.4(H) 9.7 9.6  Total Protein 6.5 - 8.1 g/dL 7.8 7.7 7.7  Total Bilirubin 0.3 - 1.2 mg/dL 0.6 0.3 0.3  Alkaline Phos 38 - 126 U/L 70 69 66  AST 15 - 41 U/L 11(L) 19 17  ALT 0 - 44 U/L _1 CBC    Component Value Date/Time   WBC 15.1 (H) 07/31/2020 0920   RBC 2.62 (L) 07/31/2020 0920   RBC 2.61 (L) 07/31/2020 0920   HGB 7.9 (L) 07/31/2020 0920   HGB 6.3 (LL) 11/08/2019 1350   HGB 10.4 (L) 10/04/2016 1154   HCT 23.3 (L) 07/31/2020 0920   HCT 31.3 (L) 10/04/2016 1154   PLT 210 07/31/2020 0920   PLT 10 (L) 11/08/2019 1350   PLT 320 10/04/2016 1154   MCV 88.9 07/31/2020 0920   MCV 95.7 10/04/2016 1154   MCH 30.2 07/31/2020 0920   MCHC 33.9 07/31/2020 0920   RDW 18.1 (H) 07/31/2020 0920   RDW 16.3 (H) 10/04/2016 1154   LYMPHSABS PENDING 07/31/2020 0920   LYMPHSABS 1.7 10/04/2016 1154   MONOABS PENDING 07/31/2020 0920   MONOABS 0.5 10/04/2016 1154   EOSABS PENDING 07/31/2020 0920   EOSABS 0.3 10/04/2016 1154   BASOSABS PENDING 07/31/2020 0920   BASOSABS 0.0 10/04/2016 1154    STUDIES: DG Chest 2 View  Result Date: 07/09/2020 CLINICAL DATA:  Chest pain EXAM: CHEST - 2 VIEW COMPARISON:  06/26/2020 CT chest. 07/09/2019 chest radiograph and prior. FINDINGS: Cardiomediastinal silhouette within normal limits. Minimal left basilar opacities, likely atelectasis. No pneumothorax or pleural effusion. No acute osseous abnormality. Left chest wall Port-A-Cath tip overlies the upper right atrium. IMPRESSION: Minimal left basilar opacities, likely atelectasis. No acute airspace disease. Electronically Signed   By: Primitivo Gauze M.D.   On: 07/09/2020 13:22   DG Abd 2 Views  Result Date: 07/17/2020 CLINICAL DATA:  History of constipation EXAM: ABDOMEN - 2  VIEW COMPARISON:  None. FINDINGS: Scattered large and small bowel gas is noted. Mild retained fecal material is seen. No obstructive changes are noted. Mild degenerative change of the lumbar spine is seen. No free air is noted. No other focal abnormality is seen. IMPRESSION: Mild retained fecal material without obstructive change. Electronically Signed   By: Inez Catalina M.D.   On: 07/17/2020 15:15     RESEARCH: Referred to PREVENT study, but was pregnant at the time; referred to [facility] 11202, but the biopsied lymph node was benign; referred to weight loss study but declined; referred to [facility] 12/09/2000, but the timing of radiation and 8 was greater than 60 days past the date of diagnosis; referred to health disparity study, but declined; referred to [id number] adjuvant therapy study, but the patient received Xeloda with radiation and therefore was ineligible   ASSESSMENT: 44 y.o. Rebecca [name] woman status post right breast upper-outer quadrant biopsy 01/28/2016 for a clinical T2 N0 invasive ductal carcinoma, grade 3, triple negative, with an MIB-1 of 70%.  (a) suspicious right axillary lymph node biopsied 01/28/2016 was benign  (1) neoadjuvant chemotherapy: doxorubicin and cyclophosphamide in dose dense fashion 4 started 04/14/16, completed 05/26/2016, followed by paclitaxel and carboplatin weekly 12, Started 06/09/2016  (a) taxol discontinued after 7 doses because of neuropathy, last dose 07/21/2016  (  2) genetics testing October 20, 2016 through the 32-gene Comprehensive Cancer Panel offered by [facility] [place], Rebecca [name]) (with MSH2 Exons 1-7 Inversion Analysis) found no deleterious mutations or VUSS  In APC, ATM, AXIN2, BARD1, BMPR1A, BRCA1, BRCA2, BRIP1, CDH1, CDK4, CDKN2A, CHEK2, EPCAM, FANCC, MLH1, MSH2, MSH6, MUTYH, NBN, PALB2, PMS2, POLD1, POLE, PTEN, RAD51C, RAD51D, SCG5/GREM1, SMAD4, STK11, TP53, VHL, and XRCC2.    (3) right mastectomy and sentinel lymph node sampling  09/06/2016 showed a residual ypT1c ypN0, invasive ductal carcinoma, grade 3, with negative margins. Repeat prognostic panel again triple negative   (4) adjuvant radiation with capecitabine/Xeloda sensitization 11/15/16 - 01/12/17 Site/dose:   Right Chest Wall and axilla (4 field) treated to 45 Gy in 25 fractions, and then Boosted an additional 14.4 Gy in 8 fractions.  (5) tobacco abuse disorder: The patient quit smoking 04/04/2018  METASTATIC DISEASE:  July 2020: chest wall, bones, nodes (6) nonspecific changes noted on chest CT 06/11/2019 were clarified by PET scan 06/19/2019 showing hypermetabolic disease in the right anterior chest wall, right internal mammary nodes, right and left axillary nodes, but no metastatic disease in the neck, lungs, abdomen or pelvis.  Bone marrow uptake suggests bony metastatic disease.  (a) CARIS requested obtained from 06/28/2019 sample confirmed a triple negativity, the tumor also was negative for the androgen receptor, was MSI stable and mismatch repair status proficient, with a low mutational burden.  BRCA 1 and 2 were negative and PD-L1 was negative.  PI K3 showed a variant of uncertain significance.  However the tumor was genomic LOH high  (b) CA-27-29 is informative: was 118.3 on 06/04/2019  (7) zoledronate started 07/17/2019--on hold currently due to dental issues, and until 6 weeks at least after dental work  (8) carboplatin/ gemcitabine days 1 and 8 Q21 day cycle started 07/10/2019, last dose 10/30/2019  (a) restaging studies 09/2019 showed no progression  (b) restaging studies after 6 cycles showed mild disease progression  (9) cyclophosphamide, methotrexate, fluorouracil chemotherapy started 11/13/2019, repeated every 21 days.  (a) discontinued after cycle 3 (12/31/2019): No evidence of response  (10) started capecitabine 01/21/2020, given 1 week on and 1 week off at standard doses (1500 mg twice daily)   (a) Discontinued after almost three months of  treatment (last on 04/06/2020)  (b) Disease progression on 4/27 CT scan showing increasing right chest wall disease, left breast lesions, and ? Liver involvement.  (c) MRI liver 04/14/2020 shows no liver invovlement  (11) Started liposomal doxorubicin/Doxil given on day 1 of a 21 day cycle on 04/07/2020  (a) echo on 04/02/2020 shows EF of 50-55%, repeat in 06/2020  (b) chest CT scan after 4 cycles of Doxil shows some evidence of progression in left axillary and right internal mammary adenopathy  (c) Doxil  after 06/09/2020 dose  (12) to start sacituzumab govitecan/ Rebecca [name] 07/10/2020 given on day 1 and 8 of a 21 day cycle  (a) Udenyca added on day 11 to prevent treatment related neutropenia delaying chemotherapy    PLAN: Sanjuanita is not feeling well today.  I listed her issues in numerical order below:  1. Vaginal Candida: Will treat with Fluconazole daily.  If persistent, would recommend f/u with GYN to r/o bacterial vaginosis.  2. Cancer related pain: This is worsening and she does not want to receive treatment today because she doesn't believe it is working.  We will go ahead and grab repeat CT of the chest and she will f/u with Dr. [name] early next week prior to him going  out of town to discuss further.  I sent in Oxycodone for her pain, and she will receive IV morphine while in treatment room today.  3. Constipation: Miralax daily and Senokot S 2 tab BID.  Can consider the addition of  Amitiza or Linzess if the above mentioned choices do not work.    4.  Anemia: her hemoglobin is 7.9 today.  She will receive one unit of PRBCs today, and hopefully this will help with her fatigue.    Shamya will return early next week for her scans and f/u with Dr. [name].  She knows to call for any questions that may arise between now and her next appointment.  We are happy to see her sooner if needed.   Total encounter time: 30 minutes*  Wilber Bihari, NP 07/31/20 9:56 AM Medical Oncology and  Hematology [facility] [place], [place] [address] Tel. [phone]    Fax. [phone]  *Total Encounter Time as defined by the Centers for Medicare and Medicaid Services includes, in addition to the face-to-face time of a patient visit (documented in the note above) non-face-to-face time: obtaining and reviewing outside history, ordering and reviewing medications, tests or procedures, care coordination (communications with other health care professionals or caregivers) and documentation in the medical record.

## 2020-08-01 LAB — BPAM RBC
Blood Product Expiration Date: 202109282359
ISSUE DATE / TIME: 202108271119
Unit Type and Rh: 5100

## 2020-08-01 LAB — TYPE AND SCREEN
ABO/RH(D): O POS
Antibody Screen: NEGATIVE
Unit division: 0

## 2020-08-01 LAB — CANCER ANTIGEN 27.29: CA 27.29: 290.5 U/mL — ABNORMAL HIGH (ref 0.0–38.6)

## 2020-08-03 ENCOUNTER — Telehealth: Payer: Self-pay | Admitting: Adult Health

## 2020-08-03 ENCOUNTER — Telehealth: Payer: Self-pay | Admitting: *Deleted

## 2020-08-03 ENCOUNTER — Other Ambulatory Visit: Payer: Self-pay | Admitting: Oncology

## 2020-08-03 NOTE — Progress Notes (Signed)
Rebecca [name] was supposed to have been treated 07/31/2020 but was very frustrated, there were some delays, and she left without getting treated.  She also was in uncontrolled pain.  She has been prescribed some oxycodone and that seems to be helping.  I called today to let her know that her tumor marker is coming down.  I feel we may make some progress if we continue with the current treatment, Trudell V, and she is scheduled for treatment this Friday, 08/07/2020.  I will call her tomorrow after I get the results of her scans, but almost regardless of what they show I would encourage her to continue with the Trodelvy.  At this point she appears to be agreeable.

## 2020-08-03 NOTE — Telephone Encounter (Signed)
No 8/27 los. No changes made to pt's schedule.

## 2020-08-03 NOTE — Telephone Encounter (Signed)
Per Annabelle Harman, called to f/u on pt. Pt had no concerns at this time. Advised to call office with any concerns. Pt verbalized understanding

## 2020-08-04 ENCOUNTER — Other Ambulatory Visit: Payer: Self-pay

## 2020-08-04 ENCOUNTER — Inpatient Hospital Stay (HOSPITAL_BASED_OUTPATIENT_CLINIC_OR_DEPARTMENT_OTHER): Payer: Medicaid Other | Admitting: Oncology

## 2020-08-04 ENCOUNTER — Telehealth: Payer: Self-pay | Admitting: Oncology

## 2020-08-04 ENCOUNTER — Encounter (HOSPITAL_COMMUNITY): Payer: Self-pay

## 2020-08-04 ENCOUNTER — Ambulatory Visit (HOSPITAL_COMMUNITY)
Admission: RE | Admit: 2020-08-04 | Discharge: 2020-08-04 | Disposition: A | Discharge status: Home / Self Care | Payer: Medicaid Other | Source: Ambulatory Visit | Attending: Adult Health | Admitting: Adult Health

## 2020-08-04 VITALS — BP 123/94 | HR 119 | Temp 98.2°F | Resp 18 | Ht 61.5 in | Wt 188.5 lb

## 2020-08-04 DIAGNOSIS — Z6841 Body Mass Index (BMI) 40.0 and over, adult: Secondary | ICD-10-CM

## 2020-08-04 DIAGNOSIS — Z171 Estrogen receptor negative status [ER-]: Secondary | ICD-10-CM

## 2020-08-04 DIAGNOSIS — C50919 Malignant neoplasm of unspecified site of unspecified female breast: Secondary | ICD-10-CM | POA: Diagnosis not present

## 2020-08-04 DIAGNOSIS — T451X5A Adverse effect of antineoplastic and immunosuppressive drugs, initial encounter: Secondary | ICD-10-CM

## 2020-08-04 DIAGNOSIS — C50411 Malignant neoplasm of upper-outer quadrant of right female breast: Secondary | ICD-10-CM | POA: Diagnosis not present

## 2020-08-04 DIAGNOSIS — C7951 Secondary malignant neoplasm of bone: Secondary | ICD-10-CM | POA: Diagnosis not present

## 2020-08-04 DIAGNOSIS — J9811 Atelectasis: Secondary | ICD-10-CM | POA: Diagnosis not present

## 2020-08-04 DIAGNOSIS — Z7189 Other specified counseling: Secondary | ICD-10-CM | POA: Diagnosis not present

## 2020-08-04 DIAGNOSIS — Z5112 Encounter for antineoplastic immunotherapy: Secondary | ICD-10-CM | POA: Diagnosis not present

## 2020-08-04 DIAGNOSIS — G620 Drug-induced polyneuropathy: Secondary | ICD-10-CM | POA: Diagnosis not present

## 2020-08-04 DIAGNOSIS — I700 Atherosclerosis of aorta: Secondary | ICD-10-CM | POA: Diagnosis not present

## 2020-08-04 DIAGNOSIS — L98499 Non-pressure chronic ulcer of skin of other sites with unspecified severity: Secondary | ICD-10-CM | POA: Diagnosis not present

## 2020-08-04 MED ORDER — SODIUM CHLORIDE (PF) 0.9 % IJ SOLN
INTRAMUSCULAR | Status: AC
  Filled 2020-08-04: qty 50

## 2020-08-04 MED ORDER — IOHEXOL 300 MG/ML  SOLN
75.0000 mL | Freq: Once | INTRAMUSCULAR | Status: AC | PRN
  Administered 2020-08-04: 75 mL via INTRAVENOUS

## 2020-08-04 NOTE — Progress Notes (Signed)
[facility]  [phone] Fax:([phone]) [phone]   ID: [name] DOB: 1976/04/05  MR#: [id number]  VOJ#:[id number]  Patient Care Team: Dorena Dew, FNP as PCP - General (Family Medicine) Alphonsa Overall, MD as Consulting Physician (General Surgery) Diago Haik, [name], MD as Consulting Physician (Oncology) Gery Pray, MD as Consulting Physician (Radiation Oncology) Delice Bison, Charlestine Massed, NP as Nurse Practitioner (Hematology and Oncology) Alda Berthold, DO as Consulting Physician (Neurology) OTHER MD:  CHIEF COMPLAINT: triple negative stage IV breast cancer  CURRENT TREATMENT: sacituzumab/Trodelvy    INTERVAL HISTORY: Rebecca [name] was started on trastuzumab and appears to be tolerating it well.  She then showed up for treatment but there was some delay and she became flustered and left.  She stated that she did not want to receive any more treatments as they were not working.  I called her and let her know that her tumor marker is decreasing which is an encouraging sign.  She also appears to be tolerating the [place] well.  I suggest that she not give up at this point as I think we can make progress on her very difficult to treat skin can spread of her cancer.  She was scheduled for CT today and this is generally stable although there are some hints of early response.  REVIEW OF SYSTEMS: Dosha was prescribed oxycodone 07/31/2020.  She is benefiting from this and that she has better control of her pain and is able to do more.  This is the correct response to pain medicine--the goal is not no pain but rather to be able to do better despite the pain.  After taking 1 pill for example she was able to go shopping which she would not otherwise have been able to do.  She continues to be unable to AB duct the right upper extremity.  She continues to have significant skin involvement by her tumor but apparently with less oozing.  A detailed review of systems was  otherwise stable.    BREAST CANCER HISTORY: From the original intake note:  Ily herself noted a change in her right breast sometime around September or October 2016. She did not bring it to intermediate medical attention, but on 01/13/2016 she established herself in Dr. Smith Robert' service and she was set up for bilateral diagnostic mammography with tomosynthesis and bilateral ultrasonography at the [facility] 01/19/2016. The breast density was category B. In the upper outer quadrant of the right breast there was a spiculated mass measuring 2.8 cm. On physical exam this was palpable. Targeted ultrasonography confirmed an irregular hypoechoic mass in the right breast 11:30 o'clock position measuring 2.6 cm maximally. Ultrasound of the right axilla showed a morphologically abnormal lymph node.  In the left breast there were some tubular densities behind the areola which by ultrasonography showed benign ductal ectasia.  On 01/28/2016 [name] underwent biopsy of the right breast mass and abnormal right axillary lymph node. The pathology from this procedure (S AAA [id number]) showed the lymph node to be benign. In the breast however there was an invasive ductal carcinoma, grade 3, which was estrogen and progesterone receptor negative. The proliferation marker was 70%. HER-2 was not amplified with a signals ratio of 1.32. The number per cell was 2.05.  The patient's subsequent history is as detailed below   PAST MEDICAL HISTORY: Past Medical History:  Diagnosis Date  . Anxiety   . Breast cancer ([place])   . Depression   . GERD (gastroesophageal reflux disease)   .  History of radiation therapy 11/15/16-01/12/17   right chest wall and axilla treated to 45 Gy in 25 fractions, boosted and additional 14 Gy in 8 fractions  . Hypertension    diet controlled  . Obesity (BMI 35.0-39.9 without comorbidity)   . Pneumonia    as a child  . Seasonal allergies   . Sickle cell trait ([place])   . Termination of  pregnancy (fetus) 04/02/16    PAST SURGICAL HISTORY: Past Surgical History:  Procedure Laterality Date  . CESAREAN SECTION     2004 and 2007  . MASTECTOMY W/ SENTINEL NODE BIOPSY Right 09/06/2016   Procedure: RIGHT BREAST MASTECTOMY WITH RIGHT AXILLARY SENTINEL LYMPH NODE BIOPSY;  Surgeon: Alphonsa Overall, MD;  Location: [facility];  Service: General;  Laterality: Right;  . PORT-A-CATH REMOVAL Left 09/06/2016   Procedure: REMOVAL PORT-A-CATH;  Surgeon: Alphonsa Overall, MD;  Location: [facility];  Service: General;  Laterality: Left;  . PORTACATH PLACEMENT    . PORTACATH PLACEMENT N/A 07/09/2019   Procedure: INSERTION PORT-A-CATH WITH ULTRASOUND;  Surgeon: Alphonsa Overall, MD;  Location: [facility] OR;  Service: General;  Laterality: N/A;    FAMILY HISTORY Family History  Problem Relation Age of Onset  . Hypertension Mother   . Cancer Mother        dx "intestinal cancer" in her 67s; +surgery  . Other Mother        hysterectomy at young age for unspecified cause  . Heart Problems Mother   . Breast cancer Cousin        maternal 1st cousin dx female breast cancer at 33-46y  . Cancer Father   . Hypertension Father   . Heart Problems Maternal Aunt   . Diabetes Maternal Aunt   . Breast cancer Maternal Uncle        dx 64-65  . Heart Problems Maternal Uncle   . Breast cancer Maternal Grandmother 53  . Throat cancer Maternal Grandfather        d. 32s; smoker  . Sickle cell anemia Paternal Aunt   . Congestive Heart Failure Maternal Aunt   . Multiple sclerosis Cousin   . Cancer Other        maternal great uncle (MGM's brother); cancer removed from his side  . Heart attack Paternal Aunt        d. early 59s  The patient has very little information about her father. Her mother is currently 54 years old. She had a history of cervical cancer at age 53. The patient had 2 brothers, no sisters. The maternal grandfather had throat cancer. A maternal uncle was diagnosed with breast  cancer as well as prostate cancer at the age of 30. 2 maternal cousins, one of them female, had breast cancer as well.   GYNECOLOGIC HISTORY:  No LMP recorded. Menarche age 36, first live birth age 22. The patient is GX P4. She was still having regular periods at the time of diagnosis. She took oral contraceptives in the 1990s with no side effects.--.  Stopped with chemotherapy and have not resumed as of May 2019   SOCIAL HISTORY:  (Updated 10/2018) She works as a Market researcher. The patient's significant other [name] works at [employer].  At home with the patient are her 3 children [name], [place] and [place]. There are age 26, 55, and 51 as of November 2019.  2 of them are disabled or have significant health problems, one with sickle cell disease,  the other with autism and developmental delay.  The patient's son [name], currently 61 years old, lives in [place].    ADVANCED DIRECTIVES: Not in place   HEALTH MAINTENANCE: Social History   Tobacco Use  . Smoking status: Former Smoker    Packs/day: 1.00    Years: 20.00    Pack years: 20.00    Types: Cigarettes    Quit date: 04/01/2018    Years since quitting: 2.3  . Smokeless tobacco: Never Used  . Tobacco comment: Patient has quit smoking x 1 year now  Vaping Use  . Vaping Use: Never used  Substance Use Topics  . Alcohol use: Yes    Comment: occ  . Drug use: No    Colonoscopy:  PAP:  Bone density:  Lipid panel:  No Known Allergies  Current Outpatient Medications on File Prior to Visit  Medication Sig Dispense Refill  . calcium carbonate (TUMS - DOSED IN MG ELEMENTAL CALCIUM) 500 MG chewable tablet Chew 1 tablet by mouth daily as needed for indigestion or heartburn.    . dexamethasone (DECADRON) 4 MG tablet Take 2 tablets (8 mg) daily for 3 days after chemotherapy. Take with food. 30 tablet 1  . doxycycline (VIBRA-TABS) 100 MG tablet Take 1 tablet (100 mg total) by mouth  daily. 30 tablet 4  . fluconazole (DIFLUCAN) 200 MG tablet Take 1 tablet (200 mg total) by mouth daily. 14 tablet 0  . lidocaine-prilocaine (EMLA) cream Apply to affected area once 30 g 3  . loperamide (IMODIUM A-D) 2 MG tablet Take 2 at diarrhea onset, then 1 every 2hr until 12hrs with no BM. May take 2 every 4hrs at night. If diarrhea recurs repeat. 100 tablet 1  . LORazepam (ATIVAN) 0.5 MG tablet Take 1 tablet (0.5 mg total) by mouth daily as needed for anxiety. 30 tablet 0  . oxyCODONE (OXY IR/ROXICODONE) 5 MG immediate release tablet Take 1 tablet (5 mg total) by mouth every 4 (four) hours as needed for severe pain. 30 tablet 0  . prochlorperazine (COMPAZINE) 10 MG tablet Take 1 tablet (10 mg total) by mouth every 6 (six) hours as needed (Nausea or vomiting). 30 tablet 1  . traMADol (ULTRAM) 50 MG tablet Take 1 tablet (50 mg total) by mouth every 6 (six) hours as needed. 30 tablet 0  . valACYclovir (VALTREX) 1000 MG tablet Take 1 tablet (1,000 mg total) by mouth daily. 90 tablet 1   Current Facility-Administered Medications on File Prior to Visit  Medication Dose Route Frequency Provider Last Rate Last Admin  . sodium chloride (PF) 0.9 % injection             OBJECTIVE: African-American woman who appears stated age  Vitals:   08/04/20 1155  BP: (!) 123/94  Pulse: (!) 119  Resp: 18  Temp: 98.2 F (36.8 C)  SpO2: 99%   Wt Readings from Last 3 Encounters:  08/04/20 188 lb 8 oz (85.5 kg)  07/17/20 192 lb 8 oz (87.3 kg)  07/10/20 196 lb 14.4 oz (89.3 kg)   Body mass index is 35.04 kg/m.    ECOG FS: 1  Sclerae unicteric, EOMs intact Wearing a mask No cervical or supraclavicular adenopathy Lungs no rales or rhonchi Heart regular rate and rhythm Abd soft, nontender, positive bowel sounds MSK no focal spinal tenderness Neuro: nonfocal, well oriented, appropriate affect Breasts: Deferred    Right breast 05/06/2020    Right chest wall area 11/22/2019, which is the new  baseline  LAB RESULTS: CMP Latest Ref Rng & Units 07/31/2020 07/17/2020 07/10/2020  Glucose 70 - 99 mg/dL 99 124(H) 99  BUN 6 - 20 mg/dL _0 Creatinine 0.44 - 1.00 mg/dL 0.78 0.90 0.77  Sodium 135 - 145 mmol/L 139 139 141  Potassium 3.5 - 5.1 mmol/L 3.3(L) 3.2(L) 3.6  Chloride 98 - 111 mmol/L 99 101 106  CO2 22 - 32 mmol/L _1 Calcium 8.9 - 10.3 mg/dL 9.6 10.4(H) 9.7  Total Protein 6.5 - 8.1 g/dL 7.6 7.8 7.7  Total Bilirubin 0.3 - 1.2 mg/dL 0.2(L) 0.6 0.3  Alkaline Phos 38 - 126 U/L 103 70 69  AST 15 - 41 U/L 15 11(L) 19  ALT 0 - 44 U/L _2 CBC    Component Value Date/Time   WBC 15.1 (H) 07/31/2020 0920   RBC 2.62 (L) 07/31/2020 0920   RBC 2.61 (L) 07/31/2020 0920   HGB 7.9 (L) 07/31/2020 0920   HGB 6.3 (LL) 11/08/2019 1350   HGB 10.4 (L) 10/04/2016 1154   HCT 23.3 (L) 07/31/2020 0920   HCT 31.3 (L) 10/04/2016 1154   PLT 210 07/31/2020 0920   PLT 10 (L) 11/08/2019 1350   PLT 320 10/04/2016 1154   MCV 88.9 07/31/2020 0920   MCV 95.7 10/04/2016 1154   MCH 30.2 07/31/2020 0920   MCHC 33.9 07/31/2020 0920   RDW 18.1 (H) 07/31/2020 0920   RDW 16.3 (H) 10/04/2016 1154   LYMPHSABS 1.6 07/31/2020 0920   LYMPHSABS 1.7 10/04/2016 1154   MONOABS 2.5 (H) 07/31/2020 0920   MONOABS 0.5 10/04/2016 1154   EOSABS 0.1 07/31/2020 0920   EOSABS 0.3 10/04/2016 1154   BASOSABS 0.1 07/31/2020 0920   BASOSABS 0.0 10/04/2016 1154    STUDIES: DG Chest 2 View  Result Date: 07/09/2020 CLINICAL DATA:  Chest pain EXAM: CHEST - 2 VIEW COMPARISON:  06/26/2020 CT chest. 07/09/2019 chest radiograph and prior. FINDINGS: Cardiomediastinal silhouette within normal limits. Minimal left basilar opacities, likely atelectasis. No pneumothorax or pleural effusion. No acute osseous abnormality. Left chest wall Port-A-Cath tip overlies the upper right atrium. IMPRESSION: Minimal left basilar opacities, likely atelectasis. No acute airspace disease. Electronically Signed   By: Primitivo Gauze  M.D.   On: 07/09/2020 13:22   CT Chest W Contrast  Result Date: 08/04/2020 CLINICAL DATA:  Breast cancer, assess treatment response EXAM: CT CHEST WITH CONTRAST TECHNIQUE: Multidetector CT imaging of the chest was performed during intravenous contrast administration. CONTRAST:  90m OMNIPAQUE IOHEXOL 300 MG/ML  SOLN COMPARISON:  June 26, 2020 FINDINGS: Cardiovascular: LEFT-sided Port-A-Cath terminates at the caval to atrial junction. Heart size mildly enlarged without pericardial effusion. Aortic caliber and contour is normal. Central pulmonary vasculature is unremarkable. Mediastinum/Nodes: Internal mammary lymph nodes may be slightly smaller than on the prior study. RIGHT internal mammary lymph node approximately 6 mm previously approximately 12 mm. Another area on image 52 series 3, approximately 7 mm as compared to 7 mm. No thoracic inlet adenopathy. Bulky LEFT axillary lymphadenopathy just deep to the margin of the pectoralis musculature (image 47, series 3) approximately 1.8 cm short axis on the prior study, on the current study this measures approximately 1.5 cm. Along the margin of the pectoralis musculature on image 65 of series 3 is a 17-18 mm lymph node, previously approximately 17 mm. Bulky adenopathy at the margin of the images, approximately 2.2 cm within 1 mm of the prior study with respect to the imaged portion of this  area. Ulcerated soft tissue along the RIGHT chest wall shows a similar appearance. Discrete lymph node in the RIGHT axilla also with similar appearance. Lungs/Pleura: Airways are patent. No consolidation. No pleural effusion. Basilar atelectasis. Upper Abdomen: Mild heterogeneity of hepatic parenchyma suggests hepatic steatosis. No acute upper abdominal process to the extent evaluated with limited assessment on today's exam. Musculoskeletal: Ulcerated area along the RIGHT anterior chest wall with similar appearance. No acute musculoskeletal process. IMPRESSION: 1. Bulky LEFT  axillary adenopathy with similar appearance. Perhaps 1 of these lymph nodes shows diminished size compared to the previous study as described. 2. Infiltration of the RIGHT pectoralis muscle also without interval change. Internal mammary lymph nodes may be slightly smaller than on the prior study. 3. Ulcerated soft tissue along the RIGHT anterior chest wall with similar appearance. 4. Mild heterogeneity of hepatic parenchyma suggests hepatic steatosis. 5. Aortic atherosclerosis. Aortic Atherosclerosis (ICD10-I70.0). Electronically Signed   By: Zetta Bills M.D.   On: 08/04/2020 13:30   DG Abd 2 Views  Result Date: 07/17/2020 CLINICAL DATA:  History of constipation EXAM: ABDOMEN - 2 VIEW COMPARISON:  None. FINDINGS: Scattered large and small bowel gas is noted. Mild retained fecal material is seen. No obstructive changes are noted. Mild degenerative change of the lumbar spine is seen. No free air is noted. No other focal abnormality is seen. IMPRESSION: Mild retained fecal material without obstructive change. Electronically Signed   By: Inez Catalina M.D.   On: 07/17/2020 15:15     RESEARCH: Referred to PREVENT study, but was pregnant at the time; referred to [facility] 11202, but the biopsied lymph node was benign; referred to weight loss study but declined; referred to [facility] 12/09/2000, but the timing of radiation and 8 was greater than 60 days past the date of diagnosis; referred to health disparity study, but declined; referred to [id number] adjuvant therapy study, but the patient received Xeloda with radiation and therefore was ineligible   ASSESSMENT: 44 y.o. [place] woman status post right breast upper-outer quadrant biopsy 01/28/2016 for a clinical T2 N0 invasive ductal carcinoma, grade 3, triple negative, with an MIB-1 of 70%.  (a) suspicious right axillary lymph node biopsied 01/28/2016 was benign  (1) neoadjuvant chemotherapy: doxorubicin and cyclophosphamide in dose dense fashion 4  started 04/14/16, completed 05/26/2016, followed by paclitaxel and carboplatin weekly 12, Started 06/09/2016  (a) taxol discontinued after 7 doses because of neuropathy, last dose 07/21/2016  (2) genetics testing October 20, 2016 through the 32-gene Comprehensive Cancer Panel offered by [facility] Junius Roads, MD) (with MSH2 Exons 1-7 Inversion Analysis) found no deleterious mutations or VUSS  In APC, ATM, AXIN2, BARD1, BMPR1A, BRCA1, BRCA2, BRIP1, CDH1, CDK4, CDKN2A, CHEK2, EPCAM, FANCC, MLH1, MSH2, MSH6, MUTYH, NBN, PALB2, PMS2, POLD1, POLE, PTEN, RAD51C, RAD51D, SCG5/GREM1, SMAD4, STK11, TP53, VHL, and XRCC2.    (3) right mastectomy and sentinel lymph node sampling 09/06/2016 showed a residual ypT1c ypN0, invasive ductal carcinoma, grade 3, with negative margins. Repeat prognostic panel again triple negative   (4) adjuvant radiation with capecitabine/Xeloda sensitization 11/15/16 - 01/12/17 Site/dose:   Right Chest Wall and axilla (4 field) treated to 45 Gy in 25 fractions, and then Boosted an additional 14.4 Gy in 8 fractions.  (5) tobacco abuse disorder: The patient quit smoking 04/04/2018  METASTATIC DISEASE:  July 2020: chest wall, bones, nodes (6) nonspecific changes noted on chest CT 06/11/2019 were clarified by PET scan 06/19/2019 showing hypermetabolic disease in the right anterior chest wall, right internal mammary nodes, right and  left axillary nodes, but no metastatic disease in the neck, lungs, abdomen or pelvis.  Bone marrow uptake suggests bony metastatic disease.  (a) CARIS requested obtained from 06/28/2019 sample confirmed a triple negativity, the tumor also was negative for the androgen receptor, was MSI stable and mismatch repair status proficient, with a low mutational burden.  BRCA 1 and 2 were negative and PD-L1 was negative.  PI K3 showed a variant of uncertain significance.  However the tumor was genomic LOH high  (b) CA-27-29 is informative: was 118.3 on  06/04/2019  (7) zoledronate started 07/17/2019--on hold currently due to dental issues, and until 6 weeks at least after dental work  (8) carboplatin/ gemcitabine days 1 and 8 Q21 day cycle started 07/10/2019, last dose 10/30/2019  (a) restaging studies 09/2019 showed no progression  (b) restaging studies after 6 cycles showed mild disease progression  (9) cyclophosphamide, methotrexate, fluorouracil chemotherapy started 11/13/2019, repeated every 21 days.  (a) discontinued after cycle 3 (12/31/2019): No evidence of response  (10) started capecitabine 01/21/2020, given 1 week on and 1 week off at standard doses (1500 mg twice daily)   (a) Discontinued after almost three months of treatment (last on 04/06/2020)  (b) Disease progression on 4/27 CT scan showing increasing right chest wall disease, left breast lesions, and ? Liver involvement.  (c) MRI liver 04/14/2020 shows no liver invovlement  (11) Started liposomal doxorubicin/Doxil given on day 1 of a 21 day cycle on 04/07/2020  (a) echo on 04/02/2020 shows EF of 50-55%, repeat in 06/2020  (b) chest CT scan after 4 cycles of Doxil shows some evidence of progression in left axillary and right internal mammary adenopathy  (c) Doxil discontinued after 06/09/2020 dose  (12) started sacituzumab govitecan/ Trodelvy 07/10/2020    PLAN: Sherlynn is feeling a bit more positive now that we discussed the improvement in her tumor marker.  I also called her today and left her message with the results of her CT scan which shows stable disease, with some hints of early response.  Definitely we should continue the trudelvy and she is scheduled for her next treatment on 08/07/2020.  She will be treated again on day 8, 08/14/2020 and then receive her udenyca on 08/16/2020.  She will return to see me on 08/27/2020 (I am switching her to Thursdays) and I would like to repeat a CT scan after she has received a total of 3 additional cycles from today  Patient needs a  lot of encouragement.  She also appears to be benefiting from better pain control using oxycodone.  She is using the pills so as to be able to do more things during the day and that is the appropriate goal.  We will refill these as needed.  So far she has not required a bowel prophylaxis.  Total encounter time 25 minutes.Sarajane Jews C. Kiegan Macaraeg, MD 08/04/20 6:17 PM Medical Oncology and Hematology [facility] [place], [place] [address] Tel. [phone]    Fax. [phone]   I, Wilburn Mylar, am acting as scribe for Dr. [name]. Darcy Barbara.  I, Lurline Del MD, have reviewed the above documentation for accuracy and completeness, and I agree with the above.   *Total Encounter Time as defined by the Centers for Medicare and Medicaid Services includes, in addition to the face-to-face time of a patient visit (documented in the note above) non-face-to-face time: obtaining and reviewing outside history, ordering and reviewing medications, tests or procedures, care coordination (communications with other  health care professionals or caregivers) and documentation in the medical record.

## 2020-08-04 NOTE — Telephone Encounter (Signed)
Scheduled per providers request, patient is notified.

## 2020-08-05 ENCOUNTER — Telehealth: Payer: Self-pay | Admitting: Oncology

## 2020-08-05 NOTE — Telephone Encounter (Signed)
Scheduled appts per 8/31 los. Pt confirmed appt dates and times.

## 2020-08-06 ENCOUNTER — Other Ambulatory Visit: Payer: Self-pay | Admitting: *Deleted

## 2020-08-06 DIAGNOSIS — C50411 Malignant neoplasm of upper-outer quadrant of right female breast: Secondary | ICD-10-CM

## 2020-08-07 ENCOUNTER — Inpatient Hospital Stay: Payer: Medicaid Other

## 2020-08-07 ENCOUNTER — Inpatient Hospital Stay: Payer: Medicaid Other | Attending: Oncology

## 2020-08-07 ENCOUNTER — Encounter: Payer: Self-pay | Admitting: Nurse Practitioner

## 2020-08-07 ENCOUNTER — Other Ambulatory Visit: Payer: Self-pay

## 2020-08-07 ENCOUNTER — Inpatient Hospital Stay (HOSPITAL_BASED_OUTPATIENT_CLINIC_OR_DEPARTMENT_OTHER): Payer: Medicaid Other | Admitting: Nurse Practitioner

## 2020-08-07 VITALS — BP 134/80 | HR 102 | Temp 97.1°F | Resp 18 | Ht 61.5 in | Wt 189.5 lb

## 2020-08-07 VITALS — HR 99

## 2020-08-07 DIAGNOSIS — Z5112 Encounter for antineoplastic immunotherapy: Secondary | ICD-10-CM | POA: Insufficient documentation

## 2020-08-07 DIAGNOSIS — C782 Secondary malignant neoplasm of pleura: Secondary | ICD-10-CM | POA: Diagnosis not present

## 2020-08-07 DIAGNOSIS — M47816 Spondylosis without myelopathy or radiculopathy, lumbar region: Secondary | ICD-10-CM | POA: Insufficient documentation

## 2020-08-07 DIAGNOSIS — E669 Obesity, unspecified: Secondary | ICD-10-CM | POA: Insufficient documentation

## 2020-08-07 DIAGNOSIS — Z7189 Other specified counseling: Secondary | ICD-10-CM

## 2020-08-07 DIAGNOSIS — Z87891 Personal history of nicotine dependence: Secondary | ICD-10-CM | POA: Diagnosis not present

## 2020-08-07 DIAGNOSIS — I700 Atherosclerosis of aorta: Secondary | ICD-10-CM | POA: Insufficient documentation

## 2020-08-07 DIAGNOSIS — F418 Other specified anxiety disorders: Secondary | ICD-10-CM | POA: Insufficient documentation

## 2020-08-07 DIAGNOSIS — D573 Sickle-cell trait: Secondary | ICD-10-CM | POA: Insufficient documentation

## 2020-08-07 DIAGNOSIS — Z803 Family history of malignant neoplasm of breast: Secondary | ICD-10-CM | POA: Insufficient documentation

## 2020-08-07 DIAGNOSIS — D709 Neutropenia, unspecified: Secondary | ICD-10-CM | POA: Diagnosis not present

## 2020-08-07 DIAGNOSIS — Z171 Estrogen receptor negative status [ER-]: Secondary | ICD-10-CM | POA: Diagnosis not present

## 2020-08-07 DIAGNOSIS — I10 Essential (primary) hypertension: Secondary | ICD-10-CM | POA: Diagnosis not present

## 2020-08-07 DIAGNOSIS — Z923 Personal history of irradiation: Secondary | ICD-10-CM | POA: Insufficient documentation

## 2020-08-07 DIAGNOSIS — C7951 Secondary malignant neoplasm of bone: Secondary | ICD-10-CM

## 2020-08-07 DIAGNOSIS — C778 Secondary and unspecified malignant neoplasm of lymph nodes of multiple regions: Secondary | ICD-10-CM | POA: Diagnosis not present

## 2020-08-07 DIAGNOSIS — Z801 Family history of malignant neoplasm of trachea, bronchus and lung: Secondary | ICD-10-CM | POA: Insufficient documentation

## 2020-08-07 DIAGNOSIS — Z809 Family history of malignant neoplasm, unspecified: Secondary | ICD-10-CM | POA: Insufficient documentation

## 2020-08-07 DIAGNOSIS — K219 Gastro-esophageal reflux disease without esophagitis: Secondary | ICD-10-CM | POA: Insufficient documentation

## 2020-08-07 DIAGNOSIS — C50411 Malignant neoplasm of upper-outer quadrant of right female breast: Secondary | ICD-10-CM | POA: Diagnosis not present

## 2020-08-07 DIAGNOSIS — C50919 Malignant neoplasm of unspecified site of unspecified female breast: Secondary | ICD-10-CM

## 2020-08-07 LAB — CMP (CANCER CENTER ONLY)
ALT: 10 U/L (ref 0–44)
AST: 13 U/L — ABNORMAL LOW (ref 15–41)
Albumin: 3.4 g/dL — ABNORMAL LOW (ref 3.5–5.0)
Alkaline Phosphatase: 73 U/L (ref 38–126)
Anion gap: 9 (ref 5–15)
BUN: 6 mg/dL (ref 6–20)
CO2: 27 mmol/L (ref 22–32)
Calcium: 9.5 mg/dL (ref 8.9–10.3)
Chloride: 105 mmol/L (ref 98–111)
Creatinine: 0.8 mg/dL (ref 0.44–1.00)
GFR, Est AFR Am: >60 mL/min (ref >60)
GFR, Estimated: >60 mL/min (ref >60)
Glucose, Bld: 124 mg/dL — ABNORMAL HIGH (ref 70–99)
Potassium: 3.4 mmol/L — ABNORMAL LOW (ref 3.5–5.1)
Sodium: 141 mmol/L (ref 135–145)
Total Bilirubin: 0.3 mg/dL (ref 0.3–1.2)
Total Protein: 7.8 g/dL (ref 6.5–8.1)

## 2020-08-07 LAB — CBC WITH DIFFERENTIAL (CANCER CENTER ONLY)
Abs Immature Granulocytes: 0.05 10*3/uL (ref 0.00–0.07)
Basophils Absolute: 0.1 10*3/uL (ref 0.0–0.1)
Basophils Relative: 1 %
Eosinophils Absolute: 0.1 10*3/uL (ref 0.0–0.5)
Eosinophils Relative: 1 %
HCT: 28.4 % — ABNORMAL LOW (ref 36.0–46.0)
Hemoglobin: 9.4 g/dL — ABNORMAL LOW (ref 12.0–15.0)
Immature Granulocytes: 0 %
Lymphocytes Relative: 13 %
Lymphs Abs: 1.7 10*3/uL (ref 0.7–4.0)
MCH: 29.8 pg (ref 26.0–34.0)
MCHC: 33.1 g/dL (ref 30.0–36.0)
MCV: 90.2 fL (ref 80.0–100.0)
Monocytes Absolute: 0.9 10*3/uL (ref 0.1–1.0)
Monocytes Relative: 7 %
Neutro Abs: 9.9 10*3/uL — ABNORMAL HIGH (ref 1.7–7.7)
Neutrophils Relative %: 78 %
Platelet Count: 309 10*3/uL (ref 150–400)
RBC: 3.15 MIL/uL — ABNORMAL LOW (ref 3.87–5.11)
RDW: 17 % — ABNORMAL HIGH (ref 11.5–15.5)
WBC Count: 12.7 10*3/uL — ABNORMAL HIGH (ref 4.0–10.5)
nRBC: 0 % (ref 0.0–0.2)

## 2020-08-07 LAB — SAMPLE TO BLOOD BANK

## 2020-08-07 MED ORDER — ACETAMINOPHEN 325 MG PO TABS
ORAL_TABLET | ORAL | Status: AC
  Filled 2020-08-07: qty 2

## 2020-08-07 MED ORDER — SODIUM CHLORIDE 0.9 % IV SOLN
Freq: Once | INTRAVENOUS | Status: AC
  Filled 2020-08-07: qty 250

## 2020-08-07 MED ORDER — HEPARIN SOD (PORK) LOCK FLUSH 100 UNIT/ML IV SOLN
500.0000 [IU] | Freq: Once | INTRAVENOUS | Status: DC | PRN
  Filled 2020-08-07: qty 5

## 2020-08-07 MED ORDER — ATROPINE SULFATE 1 MG/ML IJ SOLN
INTRAMUSCULAR | Status: AC
  Filled 2020-08-07: qty 1

## 2020-08-07 MED ORDER — FAMOTIDINE IN NACL 20-0.9 MG/50ML-% IV SOLN
INTRAVENOUS | Status: AC
  Filled 2020-08-07: qty 50

## 2020-08-07 MED ORDER — DIPHENHYDRAMINE HCL 50 MG/ML IJ SOLN
INTRAMUSCULAR | Status: AC
  Filled 2020-08-07: qty 1

## 2020-08-07 MED ORDER — SODIUM CHLORIDE 0.9 % IV SOLN
10.0000 mg | Freq: Once | INTRAVENOUS | Status: AC
  Administered 2020-08-07: 10 mg via INTRAVENOUS
  Filled 2020-08-07: qty 10

## 2020-08-07 MED ORDER — OXYCODONE HCL 5 MG PO TABS: 5.0000 mg | ORAL_TABLET | ORAL | 0 refills | Status: DC | PRN

## 2020-08-07 MED ORDER — DIPHENHYDRAMINE HCL 50 MG/ML IJ SOLN
50.0000 mg | Freq: Once | INTRAMUSCULAR | Status: AC
  Administered 2020-08-07: 50 mg via INTRAVENOUS

## 2020-08-07 MED ORDER — ATROPINE SULFATE 1 MG/ML IJ SOLN: 0.5000 mg | Freq: Once | INTRAMUSCULAR | Status: DC | PRN

## 2020-08-07 MED ORDER — SODIUM CHLORIDE 0.9 % IV SOLN
10.0000 mg/kg | Freq: Once | INTRAVENOUS | Status: AC
  Administered 2020-08-07: 900 mg via INTRAVENOUS
  Filled 2020-08-07: qty 90

## 2020-08-07 MED ORDER — SODIUM CHLORIDE 0.9% FLUSH
10.0000 mL | INTRAVENOUS | Status: DC | PRN
  Filled 2020-08-07: qty 10

## 2020-08-07 MED ORDER — ACETAMINOPHEN 325 MG PO TABS
650.0000 mg | ORAL_TABLET | Freq: Once | ORAL | Status: AC
  Administered 2020-08-07: 650 mg via ORAL

## 2020-08-07 MED ORDER — PALONOSETRON HCL INJECTION 0.25 MG/5ML
0.2500 mg | Freq: Once | INTRAVENOUS | Status: AC
  Administered 2020-08-07: 0.25 mg via INTRAVENOUS

## 2020-08-07 MED ORDER — FAMOTIDINE IN NACL 20-0.9 MG/50ML-% IV SOLN
20.0000 mg | Freq: Once | INTRAVENOUS | Status: AC
  Administered 2020-08-07: 20 mg via INTRAVENOUS

## 2020-08-07 MED ORDER — PALONOSETRON HCL INJECTION 0.25 MG/5ML
INTRAVENOUS | Status: AC
  Filled 2020-08-07: qty 5

## 2020-08-07 MED ORDER — SODIUM CHLORIDE 0.9 % IV SOLN
150.0000 mg | Freq: Once | INTRAVENOUS | Status: AC
  Administered 2020-08-07: 150 mg via INTRAVENOUS
  Filled 2020-08-07: qty 150

## 2020-08-07 NOTE — Progress Notes (Signed)
  [facility] OFFICE PROGRESS NOTE   Diagnosis: Triple negative stage IV breast cancer  INTERVAL HISTORY:   [name] returns as scheduled.  She completed cycle 1 day 8 Trodelvy 07/17/2020.  She is seen today prior to proceeding with cycle 2.  She denies nausea/vomiting.  No mouth sores.  She reports a "fever blister" at the lower lip.  She has taken Valtrex in the past, not recently.  No diarrhea.  She denies signs of an allergic reaction following treatment.  She denies fever, cough, shortness of breath.  She has a good appetite.  Right arm/chest pain may be some better, controlled with oxycodone typically twice a day.  She feels better since the blood transfusion 07/31/2020.  Objective:  Vital signs in last 24 hours:  Blood pressure 134/80, pulse (!) 102, temperature (!) 97.1 F (36.2 C), resp. rate 18, height 5' 1.5" (1.562 m), weight 189 lb 8 oz (86 kg), SpO2 100 %.    HEENT: Resolving blister type lesion at the right lower lip. Resp: Lungs clear bilaterally. Cardio: Regular rate and rhythm. GI: Abdomen soft and nontender.  No hepatomegaly. Vascular: No leg edema.  Calves soft and nontender. Neuro: Alert and oriented. Skin: Right chest wall is bandaged.  She put a new bandage on this morning and declined to remove for an examination today.   Lab Results:  Lab Results  Component Value Date   WBC 12.7 (H) 08/07/2020   HGB 9.4 (L) 08/07/2020   HCT 28.4 (L) 08/07/2020   MCV 90.2 08/07/2020   PLT 309 08/07/2020   NEUTROABS 9.9 (H) 08/07/2020    Imaging:  No results found.  Medications: I have reviewed the patient's current medications.  Assessment/Plan: 1. Breast cancer metastatic to the chest wall, bones, lymph nodes.  She completed cycle 1 Trodelvy 07/10/2020, 07/17/2020.  Disposition: [name] appears stable.  She seems to have tolerated cycle 1 Trodelvy well.  Plan to proceed with cycle 2 today as scheduled.  We again reviewed potential  toxicities.  We specifically discussed diarrhea.  She understands to contact the office should she develop diarrhea.  We reviewed the CBC and chemistry panel from today.  Labs adequate to proceed with treatment.  She will return for lab, cycle 2-day 8 Trodelvy in 1 week.  She is scheduled to be seen in follow-up in 2 weeks.  She will contact the office in the interim as outlined above or with any other problems.    Ned Card ANP/GNP-BC   08/07/2020  12:16 PM

## 2020-08-07 NOTE — Progress Notes (Signed)
Per Marlynn Perking: OK to treat with pulse 102/min

## 2020-08-07 NOTE — Patient Instructions (Signed)

## 2020-08-11 ENCOUNTER — Telehealth: Payer: Self-pay | Admitting: Nurse Practitioner

## 2020-08-11 NOTE — Telephone Encounter (Signed)
No new appointments scheduled per 9/3 los.

## 2020-08-14 ENCOUNTER — Inpatient Hospital Stay (HOSPITAL_COMMUNITY)
Admission: EM | Admit: 2020-08-14 | Discharge: 2020-08-17 | DRG: 872 | Disposition: A | Discharge status: Home / Self Care | Payer: Medicaid Other | Attending: Internal Medicine | Admitting: Internal Medicine

## 2020-08-14 ENCOUNTER — Other Ambulatory Visit: Payer: Self-pay

## 2020-08-14 ENCOUNTER — Inpatient Hospital Stay (HOSPITAL_BASED_OUTPATIENT_CLINIC_OR_DEPARTMENT_OTHER): Payer: Medicaid Other | Admitting: Medical

## 2020-08-14 ENCOUNTER — Inpatient Hospital Stay: Payer: Medicaid Other

## 2020-08-14 ENCOUNTER — Encounter (HOSPITAL_COMMUNITY): Payer: Self-pay

## 2020-08-14 ENCOUNTER — Emergency Department (HOSPITAL_COMMUNITY): Payer: Medicaid Other

## 2020-08-14 ENCOUNTER — Other Ambulatory Visit: Payer: Self-pay | Admitting: Medical

## 2020-08-14 VITALS — BP 118/90 | HR 135 | Temp 100.5°F | Resp 18

## 2020-08-14 DIAGNOSIS — F419 Anxiety disorder, unspecified: Secondary | ICD-10-CM | POA: Diagnosis present

## 2020-08-14 DIAGNOSIS — D709 Neutropenia, unspecified: Secondary | ICD-10-CM

## 2020-08-14 DIAGNOSIS — Z171 Estrogen receptor negative status [ER-]: Secondary | ICD-10-CM

## 2020-08-14 DIAGNOSIS — D701 Agranulocytosis secondary to cancer chemotherapy: Secondary | ICD-10-CM | POA: Diagnosis present

## 2020-08-14 DIAGNOSIS — R000 Tachycardia, unspecified: Secondary | ICD-10-CM | POA: Diagnosis not present

## 2020-08-14 DIAGNOSIS — Z923 Personal history of irradiation: Secondary | ICD-10-CM | POA: Diagnosis not present

## 2020-08-14 DIAGNOSIS — N390 Urinary tract infection, site not specified: Secondary | ICD-10-CM | POA: Diagnosis present

## 2020-08-14 DIAGNOSIS — N12 Tubulo-interstitial nephritis, not specified as acute or chronic: Secondary | ICD-10-CM | POA: Diagnosis not present

## 2020-08-14 DIAGNOSIS — Z8249 Family history of ischemic heart disease and other diseases of the circulatory system: Secondary | ICD-10-CM

## 2020-08-14 DIAGNOSIS — Z20822 Contact with and (suspected) exposure to covid-19: Secondary | ICD-10-CM | POA: Diagnosis present

## 2020-08-14 DIAGNOSIS — R5081 Fever presenting with conditions classified elsewhere: Secondary | ICD-10-CM

## 2020-08-14 DIAGNOSIS — C7951 Secondary malignant neoplasm of bone: Secondary | ICD-10-CM | POA: Diagnosis present

## 2020-08-14 DIAGNOSIS — K5909 Other constipation: Secondary | ICD-10-CM | POA: Diagnosis present

## 2020-08-14 DIAGNOSIS — N644 Mastodynia: Secondary | ICD-10-CM | POA: Diagnosis not present

## 2020-08-14 DIAGNOSIS — Z7952 Long term (current) use of systemic steroids: Secondary | ICD-10-CM

## 2020-08-14 DIAGNOSIS — G8929 Other chronic pain: Secondary | ICD-10-CM | POA: Diagnosis present

## 2020-08-14 DIAGNOSIS — K219 Gastro-esophageal reflux disease without esophagitis: Secondary | ICD-10-CM | POA: Diagnosis not present

## 2020-08-14 DIAGNOSIS — A419 Sepsis, unspecified organism: Principal | ICD-10-CM | POA: Diagnosis present

## 2020-08-14 DIAGNOSIS — C50411 Malignant neoplasm of upper-outer quadrant of right female breast: Secondary | ICD-10-CM | POA: Diagnosis present

## 2020-08-14 DIAGNOSIS — Z6837 Body mass index (BMI) 37.0-37.9, adult: Secondary | ICD-10-CM

## 2020-08-14 DIAGNOSIS — D6481 Anemia due to antineoplastic chemotherapy: Secondary | ICD-10-CM | POA: Diagnosis present

## 2020-08-14 DIAGNOSIS — Z9011 Acquired absence of right breast and nipple: Secondary | ICD-10-CM

## 2020-08-14 DIAGNOSIS — C50919 Malignant neoplasm of unspecified site of unspecified female breast: Secondary | ICD-10-CM

## 2020-08-14 DIAGNOSIS — D573 Sickle-cell trait: Secondary | ICD-10-CM | POA: Diagnosis present

## 2020-08-14 DIAGNOSIS — Z87891 Personal history of nicotine dependence: Secondary | ICD-10-CM | POA: Diagnosis not present

## 2020-08-14 DIAGNOSIS — E669 Obesity, unspecified: Secondary | ICD-10-CM | POA: Diagnosis not present

## 2020-08-14 DIAGNOSIS — S21101A Unspecified open wound of right front wall of thorax without penetration into thoracic cavity, initial encounter: Secondary | ICD-10-CM | POA: Diagnosis present

## 2020-08-14 DIAGNOSIS — T451X5A Adverse effect of antineoplastic and immunosuppressive drugs, initial encounter: Secondary | ICD-10-CM | POA: Diagnosis not present

## 2020-08-14 DIAGNOSIS — Z79899 Other long term (current) drug therapy: Secondary | ICD-10-CM

## 2020-08-14 DIAGNOSIS — R791 Abnormal coagulation profile: Secondary | ICD-10-CM | POA: Diagnosis present

## 2020-08-14 DIAGNOSIS — Z832 Family history of diseases of the blood and blood-forming organs and certain disorders involving the immune mechanism: Secondary | ICD-10-CM

## 2020-08-14 DIAGNOSIS — Z803 Family history of malignant neoplasm of breast: Secondary | ICD-10-CM

## 2020-08-14 DIAGNOSIS — E876 Hypokalemia: Secondary | ICD-10-CM | POA: Diagnosis present

## 2020-08-14 DIAGNOSIS — I10 Essential (primary) hypertension: Secondary | ICD-10-CM | POA: Diagnosis not present

## 2020-08-14 DIAGNOSIS — F329 Major depressive disorder, single episode, unspecified: Secondary | ICD-10-CM | POA: Diagnosis not present

## 2020-08-14 DIAGNOSIS — R509 Fever, unspecified: Secondary | ICD-10-CM | POA: Diagnosis not present

## 2020-08-14 LAB — CBC
HCT: 24 % — ABNORMAL LOW (ref 36.0–46.0)
Hemoglobin: 8 g/dL — ABNORMAL LOW (ref 12.0–15.0)
MCH: 30.2 pg (ref 26.0–34.0)
MCHC: 33.3 g/dL (ref 30.0–36.0)
MCV: 90.6 fL (ref 80.0–100.0)
Platelets: 192 10*3/uL (ref 150–400)
RBC: 2.65 MIL/uL — ABNORMAL LOW (ref 3.87–5.11)
RDW: 17.2 % — ABNORMAL HIGH (ref 11.5–15.5)
WBC: 4.8 10*3/uL (ref 4.0–10.5)
nRBC: 0 % (ref 0.0–0.2)

## 2020-08-14 LAB — CBC WITH DIFFERENTIAL/PLATELET
Abs Immature Granulocytes: 0.01 10*3/uL (ref 0.00–0.07)
Basophils Absolute: 0 10*3/uL (ref 0.0–0.1)
Basophils Relative: 1 %
Eosinophils Absolute: 0.1 10*3/uL (ref 0.0–0.5)
Eosinophils Relative: 2 %
HCT: 26.5 % — ABNORMAL LOW (ref 36.0–46.0)
Hemoglobin: 8.8 g/dL — ABNORMAL LOW (ref 12.0–15.0)
Immature Granulocytes: 0 %
Lymphocytes Relative: 42 %
Lymphs Abs: 1.3 10*3/uL (ref 0.7–4.0)
MCH: 29.3 pg (ref 26.0–34.0)
MCHC: 33.2 g/dL (ref 30.0–36.0)
MCV: 88.3 fL (ref 80.0–100.0)
Monocytes Absolute: 1 10*3/uL (ref 0.1–1.0)
Monocytes Relative: 30 %
Neutro Abs: 0.8 10*3/uL — ABNORMAL LOW (ref 1.7–7.7)
Neutrophils Relative %: 25 %
Platelets: 222 10*3/uL (ref 150–400)
RBC: 3 MIL/uL — ABNORMAL LOW (ref 3.87–5.11)
RDW: 16.8 % — ABNORMAL HIGH (ref 11.5–15.5)
WBC: 3.2 10*3/uL — ABNORMAL LOW (ref 4.0–10.5)
nRBC: 0 % (ref 0.0–0.2)

## 2020-08-14 LAB — COMPREHENSIVE METABOLIC PANEL
ALT: 11 U/L (ref 0–44)
AST: 14 U/L — ABNORMAL LOW (ref 15–41)
Albumin: 3.6 g/dL (ref 3.5–5.0)
Alkaline Phosphatase: 69 U/L (ref 38–126)
Anion gap: 9 (ref 5–15)
BUN: 6 mg/dL (ref 6–20)
CO2: 28 mmol/L (ref 22–32)
Calcium: 9.2 mg/dL (ref 8.9–10.3)
Chloride: 100 mmol/L (ref 98–111)
Creatinine, Ser: 0.72 mg/dL (ref 0.44–1.00)
GFR calc Af Amer: >60 mL/min (ref >60)
GFR calc non Af Amer: >60 mL/min (ref >60)
Glucose, Bld: 93 mg/dL (ref 70–99)
Potassium: 3.5 mmol/L (ref 3.5–5.1)
Sodium: 137 mmol/L (ref 135–145)
Total Bilirubin: 0.4 mg/dL (ref 0.3–1.2)
Total Protein: 8.1 g/dL (ref 6.5–8.1)

## 2020-08-14 LAB — URINALYSIS, COMPLETE (UACMP) WITH MICROSCOPIC
Bilirubin Urine: NEGATIVE
Glucose, UA: NEGATIVE mg/dL
Hgb urine dipstick: NEGATIVE
Ketones, ur: NEGATIVE mg/dL
Nitrite: NEGATIVE
Protein, ur: 100 mg/dL — AB
Specific Gravity, Urine: 1.012 (ref 1.005–1.030)
WBC, UA: >50 WBC/hpf — ABNORMAL HIGH (ref 0–5)
pH: 5 (ref 5.0–8.0)

## 2020-08-14 LAB — SAMPLE TO BLOOD BANK

## 2020-08-14 LAB — LACTIC ACID, PLASMA
Lactic Acid, Venous: 1.4 mmol/L (ref 0.5–1.9)
Lactic Acid, Venous: 1.1 mmol/L (ref 0.5–1.9)

## 2020-08-14 LAB — CREATININE, SERUM
Creatinine, Ser: 0.66 mg/dL (ref 0.44–1.00)
GFR calc Af Amer: >60 mL/min (ref >60)
GFR calc non Af Amer: >60 mL/min (ref >60)

## 2020-08-14 LAB — SARS CORONAVIRUS 2 (TAT 6-24 HRS): SARS Coronavirus 2: NEGATIVE

## 2020-08-14 MED ORDER — OXYCODONE HCL 5 MG PO TABS
5.0000 mg | ORAL_TABLET | ORAL | Status: DC | PRN
  Administered 2020-08-15 – 2020-08-17 (×6): 5 mg via ORAL
  Filled 2020-08-14 (×7): qty 1

## 2020-08-14 MED ORDER — SODIUM CHLORIDE 0.9 % IV SOLN
INTRAVENOUS | Status: DC
  Filled 2020-08-14: qty 250

## 2020-08-14 MED ORDER — ONDANSETRON HCL 4 MG/2ML IJ SOLN: 4.0000 mg | Freq: Four times a day (QID) | INTRAMUSCULAR | Status: DC | PRN

## 2020-08-14 MED ORDER — LACTATED RINGERS IV BOLUS (SEPSIS): 1000.0000 mL | Freq: Once | INTRAVENOUS | Status: DC

## 2020-08-14 MED ORDER — SODIUM CHLORIDE 0.9 % IV SOLN
1.0000 g | INTRAVENOUS | Status: DC
  Administered 2020-08-14: 1 g via INTRAVENOUS
  Filled 2020-08-14: qty 10

## 2020-08-14 MED ORDER — FILGRASTIM-SNDZ 480 MCG/0.8ML IJ SOSY
PREFILLED_SYRINGE | INTRAMUSCULAR | Status: AC
  Filled 2020-08-14: qty 0.8

## 2020-08-14 MED ORDER — ACETAMINOPHEN 325 MG PO TABS
650.0000 mg | ORAL_TABLET | Freq: Once | ORAL | Status: AC
  Administered 2020-08-14: 650 mg via ORAL
  Filled 2020-08-14: qty 2

## 2020-08-14 MED ORDER — ACETAMINOPHEN 325 MG PO TABS: 650.0000 mg | ORAL_TABLET | Freq: Four times a day (QID) | ORAL | Status: DC | PRN

## 2020-08-14 MED ORDER — ONDANSETRON HCL 4 MG PO TABS: 4.0000 mg | ORAL_TABLET | Freq: Four times a day (QID) | ORAL | Status: DC | PRN

## 2020-08-14 MED ORDER — BISACODYL 5 MG PO TBEC: 10.0000 mg | DELAYED_RELEASE_TABLET | Freq: Once | ORAL | Status: DC

## 2020-08-14 MED ORDER — FILGRASTIM-SNDZ 480 MCG/0.8ML IJ SOSY: 480.0000 ug | PREFILLED_SYRINGE | Freq: Once | INTRAMUSCULAR | Status: DC

## 2020-08-14 MED ORDER — ENOXAPARIN SODIUM 40 MG/0.4ML ~~LOC~~ SOLN
40.0000 mg | SUBCUTANEOUS | Status: DC
  Administered 2020-08-14 – 2020-08-16 (×3): 40 mg via SUBCUTANEOUS
  Filled 2020-08-14 (×3): qty 0.4

## 2020-08-14 MED ORDER — POLYETHYLENE GLYCOL 3350 17 G PO PACK: 17.0000 g | PACK | Freq: Every day | ORAL | Status: DC | PRN

## 2020-08-14 MED ORDER — SODIUM CHLORIDE 0.9 % IV SOLN
2.0000 g | Freq: Once | INTRAVENOUS | Status: DC
  Filled 2020-08-14: qty 2

## 2020-08-14 MED ORDER — ACETAMINOPHEN 650 MG RE SUPP: 650.0000 mg | Freq: Four times a day (QID) | RECTAL | Status: DC | PRN

## 2020-08-14 MED ORDER — LORAZEPAM 0.5 MG PO TABS
0.5000 mg | ORAL_TABLET | Freq: Every day | ORAL | Status: DC | PRN
  Administered 2020-08-15 – 2020-08-17 (×2): 0.5 mg via ORAL
  Filled 2020-08-14 (×2): qty 1

## 2020-08-14 MED ORDER — SODIUM CHLORIDE 0.9% FLUSH
10.0000 mL | INTRAVENOUS | Status: DC | PRN
  Administered 2020-08-14: 10 mL
  Filled 2020-08-14: qty 10

## 2020-08-14 MED ORDER — LACTATED RINGERS IV BOLUS (SEPSIS)
2000.0000 mL | Freq: Once | INTRAVENOUS | Status: AC
  Administered 2020-08-14: 2000 mL via INTRAVENOUS

## 2020-08-14 MED ORDER — SODIUM CHLORIDE 0.9 % IV SOLN
2.0000 g | Freq: Once | INTRAVENOUS | Status: AC
  Administered 2020-08-14: 2 g via INTRAVENOUS
  Filled 2020-08-14: qty 2

## 2020-08-14 MED ORDER — FILGRASTIM-SNDZ 480 MCG/0.8ML IJ SOSY
480.0000 ug | PREFILLED_SYRINGE | Freq: Once | INTRAMUSCULAR | Status: AC
  Administered 2020-08-14: 480 ug via SUBCUTANEOUS

## 2020-08-14 MED ORDER — MORPHINE SULFATE (PF) 4 MG/ML IV SOLN
4.0000 mg | Freq: Once | INTRAVENOUS | Status: AC
  Administered 2020-08-14: 4 mg via INTRAVENOUS
  Filled 2020-08-14: qty 1

## 2020-08-14 NOTE — ED Provider Notes (Signed)
[facility] DEPT Provider Note   CSN: [id number] Arrival date & time: 08/14/20  1734     History Chief Complaint  Patient presents with  . Abnormal Lab    Rebecca [name] is a 44 y.o. female.  HPI   Patient presents to the ED for admission for neutropenic fever.  Patient presented to the oncology office today for a scheduled infusion.  Patient was noted to have a fever.  Patient was treated in the cancer center today and had blood cultures drawn, urinalysis urine culture as well as a COVID-19 swab.  She was given cefepime and was also given a dose of Zarxio.  Cancer centers wanted to try to admit the patient directly but there were no beds available.  She was sent to the ED to arrange for hospital admission.  Patient denies any other symptoms other than her fever.  She has not had any chest pain or shortness of breath.  She denies any vomiting or diarrhea.  She does have pain associated with her breast cancer but that is not new today.  Past Medical History:  Diagnosis Date  . Anxiety   . Breast cancer ([place])   . Depression   . GERD (gastroesophageal reflux disease)   . History of radiation therapy 11/15/16-01/12/17   right chest wall and axilla treated to 45 Gy in 25 fractions, boosted and additional 14 Gy in 8 fractions  . Hypertension    diet controlled  . Obesity (BMI 35.0-39.9 without comorbidity)   . Pneumonia    as a child  . Seasonal allergies   . Sickle cell trait ([place])   . Termination of pregnancy (fetus) 04/02/16    Patient Active Problem List   Diagnosis Date Noted  . Neuropathy due to chemotherapeutic drug ([place]) 07/10/2019  . Recurrent breast adenocarcinoma ([place]) 07/02/2019  . Bone metastases ([place]) 07/02/2019  . Goals of care, counseling/discussion 07/02/2019  . Morbid obesity with body mass index (BMI) of 40.0 to 44.9 in adult ([place]) 04/10/2018  . Genetic testing 11/20/2016  . Family history of breast cancer 11/20/2016   . Malignant neoplasm of upper-outer quadrant of right breast in female, estrogen receptor negative ([place]) 02/04/2016  . Hirsutism 01/13/2016  . Tobacco dependence 01/13/2016  . Irregular periods/menstrual cycles 01/13/2016    Past Surgical History:  Procedure Laterality Date  . CESAREAN SECTION     2004 and 2007  . MASTECTOMY W/ SENTINEL NODE BIOPSY Right 09/06/2016   Procedure: RIGHT BREAST MASTECTOMY WITH RIGHT AXILLARY SENTINEL LYMPH NODE BIOPSY;  Surgeon: Alphonsa Overall, MD;  Location: [facility];  Service: General;  Laterality: Right;  . PORT-A-CATH REMOVAL Left 09/06/2016   Procedure: REMOVAL PORT-A-CATH;  Surgeon: Alphonsa Overall, MD;  Location: [facility];  Service: General;  Laterality: Left;  . PORTACATH PLACEMENT    . PORTACATH PLACEMENT N/A 07/09/2019   Procedure: INSERTION PORT-A-CATH WITH ULTRASOUND;  Surgeon: Alphonsa Overall, MD;  Location: [facility];  Service: General;  Laterality: N/A;     OB History   No obstetric history on file.     Family History  Problem Relation Age of Onset  . Hypertension Mother   . Cancer Mother        dx "intestinal cancer" in her 42s; +surgery  . Other Mother        hysterectomy at young age for unspecified cause  . Heart Problems Mother   . Breast cancer Cousin        maternal  1st cousin dx female breast cancer at 49-46y  . Cancer Father   . Hypertension Father   . Heart Problems Maternal Aunt   . Diabetes Maternal Aunt   . Breast cancer Maternal Uncle        dx 64-65  . Heart Problems Maternal Uncle   . Breast cancer Maternal Grandmother 73  . Throat cancer Maternal Grandfather        d. 22s; smoker  . Sickle cell anemia Paternal Aunt   . Congestive Heart Failure Maternal Aunt   . Multiple sclerosis Cousin   . Cancer Other        maternal great uncle (MGM's brother); cancer removed from his side  . Heart attack Paternal Aunt        d. early 61s    Social History   Tobacco Use  . Smoking status: Former  Smoker    Packs/day: 1.00    Years: 20.00    Pack years: 20.00    Types: Cigarettes    Quit date: 04/01/2018    Years since quitting: 2.3  . Smokeless tobacco: Never Used  . Tobacco comment: Patient has quit smoking x 1 year now  Vaping Use  . Vaping Use: Never used  Substance Use Topics  . Alcohol use: Yes    Comment: occ  . Drug use: No    Home Medications Prior to Admission medications   Medication Sig Start Date End Date Taking? Authorizing Provider  calcium carbonate (TUMS - DOSED IN MG ELEMENTAL CALCIUM) 500 MG chewable tablet Chew 1 tablet by mouth daily as needed for indigestion or heartburn.    [provider]  dexamethasone (DECADRON) 4 MG tablet Take 2 tablets (8 mg) daily for 3 days after chemotherapy. Take with food. 07/01/20   Magrinat, [name], MD  doxycycline (VIBRA-TABS) 100 MG tablet Take 1 tablet (100 mg total) by mouth daily. 04/21/20   Magrinat, [name], MD  fluconazole (DIFLUCAN) 200 MG tablet Take 1 tablet (200 mg total) by mouth daily. 07/31/20   Gardenia Phlegm, NP  lidocaine-prilocaine (EMLA) cream Apply to affected area once 07/01/20   Magrinat, [name], MD  loperamide (IMODIUM A-D) 2 MG tablet Take 2 at diarrhea onset, then 1 every 2hr until 12hrs with no BM. May take 2 every 4hrs at night. If diarrhea recurs repeat. Patient not taking: Reported on 08/07/2020 07/01/20   Magrinat, [name], MD  LORazepam (ATIVAN) 0.5 MG tablet Take 1 tablet (0.5 mg total) by mouth daily as needed for anxiety. 07/10/20   Causey, Charlestine Massed, NP  oxyCODONE (OXY IR/ROXICODONE) 5 MG immediate release tablet Take 1 tablet (5 mg total) by mouth every 4 (four) hours as needed for severe pain. 08/07/20   Owens Shark, NP  prochlorperazine (COMPAZINE) 10 MG tablet Take 1 tablet (10 mg total) by mouth every 6 (six) hours as needed (Nausea or vomiting). Patient not taking: Reported on 08/07/2020 07/01/20   Magrinat, [name], MD  traMADol (ULTRAM) 50 MG tablet Take 1 tablet  (50 mg total) by mouth every 6 (six) hours as needed. 07/10/20   Gardenia Phlegm, NP  valACYclovir (VALTREX) 1000 MG tablet Take 1 tablet (1,000 mg total) by mouth daily. Patient not taking: Reported on 08/07/2020 12/26/19   Magrinat, [name], MD    Allergies    Patient has no known allergies.  Review of Systems   Review of Systems  All other systems reviewed and are negative.   Physical Exam Updated  Vital Signs BP 117/79 (BP Location: Left Arm)   Pulse (!) 131   Temp 99.9 F (37.7 C) (Oral)   Resp 16   SpO2 98%   Physical Exam Vitals and nursing note reviewed.  Constitutional:      General: She is not in acute distress.    Appearance: She is well-developed. She is ill-appearing.  HENT:     Head: Normocephalic and atraumatic.     Right Ear: External ear normal.     Left Ear: External ear normal.  Eyes:     General: No scleral icterus.       Right eye: No discharge.        Left eye: No discharge.     Conjunctiva/sclera: Conjunctivae normal.  Neck:     Trachea: No tracheal deviation.  Cardiovascular:     Rate and Rhythm: Regular rhythm. Tachycardia present.  Pulmonary:     Effort: Pulmonary effort is normal. No respiratory distress.     Breath sounds: Normal breath sounds. No stridor. No wheezing or rales.  Abdominal:     General: Bowel sounds are normal. There is no distension.     Palpations: Abdomen is soft.     Tenderness: There is no abdominal tenderness. There is no guarding or rebound.  Musculoskeletal:        General: No tenderness.     Cervical back: Neck supple.  Skin:    General: Skin is warm and dry.     Findings: No rash.  Neurological:     Mental Status: She is alert.     Cranial Nerves: No cranial nerve deficit (no facial droop, extraocular movements intact, no slurred speech).     Sensory: No sensory deficit.     Motor: No abnormal muscle tone or seizure activity.     Coordination: Coordination normal.     ED Results / Procedures /  Treatments   Labs (all labs ordered are listed, but only abnormal results are displayed) Labs reviewed from results at the cancer center drawn today.  Patient has a white count of 3.2, neutrophil percent 25 hemoglobin is 8.8.  Metabolic panel is normal.  Urinalysis shows greater than 50 white blood cells large leukocyte esterase and many bacteria.  Consistent with UTI.  EKG None  Radiology No results found.  Procedures .Critical Care Performed by: Dorie Rank, MD Authorized by: Dorie Rank, MD   Critical care provider statement:    Critical care time (minutes):  30   Critical care was time spent personally by me on the following activities:  Discussions with consultants, evaluation of patient's response to treatment, examination of patient, ordering and performing treatments and interventions, ordering and review of laboratory studies, ordering and review of radiographic studies, pulse oximetry, re-evaluation of patient's condition, obtaining history from patient or surrogate and review of old charts   (including critical care time)  Medications Ordered in ED Medications  lactated ringers bolus 2,000 mL (has no administration in time range)  morphine 4 MG/ML injection 4 mg (has no administration in time range)  acetaminophen (TYLENOL) tablet 650 mg (has no administration in time range)    ED Course  I have reviewed the triage vital signs and the nursing notes.  Pertinent labs & imaging results that were available during my care of the patient were reviewed by me and considered in my medical decision making (see chart for details).    MDM Rules/Calculators/A&P  Patient presented to the ED from the cancer center for evaluation of fever and neutropenia.  Patient's vital signs are notable for tachycardia but no hypotension.  She has been given antibiotics at the cancer center.  Patient's urinalysis does suggest an infection.  Patient already has received  antibiotics at the cancer center.  I will order a fluid bolus and a lactic acid level, assess for signs of sepsis.  I will consult with the medical service for admission and further treatment. Final Clinical Impression(s) / ED Diagnoses Final diagnoses:  Pyelonephritis  Chemotherapy-induced neutropenia ([place])      Dorie Rank, MD 08/15/20 [phone]

## 2020-08-14 NOTE — Progress Notes (Signed)
   08/14/20 2047  Assess: MEWS Score  Temp 99.1 F (37.3 C)  BP 115/71  Pulse Rate (!) 116  Resp 16  SpO2 98 %  O2 Device Room Air  Assess: MEWS Score  MEWS Temp 0  MEWS Systolic 0  MEWS Pulse 2  MEWS RR 0  MEWS LOC 0  MEWS Score 2  MEWS Score Color Yellow  Assess: if the MEWS score is Yellow or Red  Were vital signs taken at a resting state? Yes  Focused Assessment No change from prior assessment  Early Detection of Sepsis Score *See Row Information* Medium  MEWS guidelines implemented *See Row Information* No, previously yellow, continue vital signs every 4 hours  Treat  MEWS Interventions Other (Comment) (monitoring continued)  Pain Scale 0-10  Pain Score 0  Take Vital Signs  Increase Vital Sign Frequency  Yellow: Q 2hr X 2 then Q 4hr X 2, if remains yellow, continue Q 4hrs  Escalate  MEWS: Escalate Yellow: discuss with charge nurse/RN and consider discussing with provider and RRT  Notify: Charge Nurse/RN  Name of Charge Nurse/RN Notified [facility] manager  Date Charge Nurse/RN Notified 08/14/20  Time Charge Nurse/RN Notified 2047  Document  Patient Outcome Other (Comment) (pt denies any distress)  Progress note created (see row info) Yes

## 2020-08-14 NOTE — Progress Notes (Signed)
[facility] Progress Note   Rebecca [name] [id number] 10-10-1976 44 y.o.  [name] is managed by Dr. Lurline Del  Actively treated with chemotherapy/immunotherapy/hormonal therapy: yes  Current therapy: [name]  Last treated: 08/07/2020 (cycle #2, day #1)  Next scheduled appointment with provider: 08/27/2020  Assessment: Plan:    Neutropenic fever ([place]) - Plan: filgrastim-sndz (ZARXIO) injection 480 mcg  Malignant neoplasm of upper-outer quadrant of right breast in female, estrogen receptor negative ([place])  Bone metastases ([place])   Neutropenic fever: Blood cultures x2, urinalysis, urine cultures, and a COVID-19 swab were collected.  The patient was given cefepime 2 g IV x1.  She additionally was given Zarxio 480 mcg x 1.  This may need to be repeated tomorrow should her counts remain low.  An attempt was made to directly admit the patient however there were no beds available in the hospital.  Based on this she will be transferred ported to the emergency room for monitoring and additional management.  No chest x-ray has been completed.  Metastatic ER negative malignant neoplasm of the right breast with bone metastasis: The patient is status post cycle #2, day #1 of [name] which was dosed on 08/07/2020.  She was scheduled to receive cycle 3 today however this was held given the fact that the patient was found to have a neutropenic fever.  Please see After Visit Summary for patient specific instructions.  Future Appointments  Date Time Provider [place]  08/17/2020 12:15 PM [facility] [place] FLUSH [facility] None  08/27/2020  7:45 AM [facility] LAB [facility] None  08/27/2020  8:00 AM [facility] [place] FLUSH [facility] None  08/27/2020  8:30 AM Magrinat, [name], MD [facility] None  08/27/2020  9:15 AM [facility] INFUSION [facility] None    No orders of the defined types were placed in this encounter.      Subjective:    Patient ID:  Rebecca [name] is a 44 y.o. (DOB 1976-05-16) female.  Chief Complaint: No chief complaint on file.   HPI Rebecca [name]  is a 44 y.o. female with a diagnosis of a metastatic ER negative malignant neoplasm of the right breast with bone metastasis.  She is managed by Dr. Nicholas Lose and is status post cycle #2, day #1 of [name] which was dosed on 08/07/2020.  She was scheduled to receive cycle 3 today.  She was seen in the infusion room when it was noted that her vital signs returned showing a pulse of 135 and a temperature of 100.3.  The remainder of her vital signs were normal with her blood pressure at 118/90 and an oxygen saturation of 100% on room air.  Her labs returned with a WBC of 3.2 with an ANC of 0.8.  Her temperature was retaken and was found to be at 100.5.  Based on this a septic work-up was completed.  Blood cultures x2, urinalysis, urine culture and COVID-19 test were collected.  Her treatment today was held.  She additionally was dosed with cefepime in 2 g IV x1 and was given Zarxio 480 mcg subcu x1 for her neutropenia.  Initially it was hoped that the patient could be directly admitted to the oncology floor however there are no rooms available at this time.  Based on this the patient will be transported to the emergency room for observation and management.  At that time no chest x-ray had yet been completed.  Her only issue of concern is that she currently has a yeast infection  for which she has not yet begun treatment.  The patient's complete oncologic history is as noted below:  BREAST CANCER HISTORY: From the original intake note:  Rebecca [name] herself noted a change in her right breast sometime around September or October 2016. She did not bring it to intermediate medical attention, but on 01/13/2016 she established herself in Dr. Smith Robert' service and she was set up for bilateral diagnostic mammography with tomosynthesis and bilateral ultrasonography  at the [facility] 01/19/2016. The breast density was category B. In the upper outer quadrant of the right breast there was a spiculated mass measuring 2.8 cm. On physical exam this was palpable. Targeted ultrasonography confirmed an irregular hypoechoic mass in the right breast 11:30 o'clock position measuring 2.6 cm maximally. Ultrasound of the right axilla showed a morphologically abnormal lymph node.  In the left breast there were some tubular densities behind the areola which by ultrasonography showed benign ductal ectasia.  On 01/28/2016 Deatrice underwent biopsy of the right breast mass and abnormal right axillary lymph node. The pathology from this procedure (S AAA [id number]) showed the lymph node to be benign. In the breast however there was an invasive ductal carcinoma, grade 3, which was estrogen and progesterone receptor negative. The proliferation marker was 70%. HER-2 was not amplified with a signals ratio of 1.32. The number per cell was 2.05.  Medications: I have reviewed the patient's current medications.  Allergies: No Known Allergies  Past Medical History:  Diagnosis Date  . Anxiety   . Breast cancer ([place])   . Depression   . GERD (gastroesophageal reflux disease)   . History of radiation therapy 11/15/16-01/12/17   right chest wall and axilla treated to 45 Gy in 25 fractions, boosted and additional 14 Gy in 8 fractions  . Hypertension    diet controlled  . Obesity (BMI 35.0-39.9 without comorbidity)   . Pneumonia    as a child  . Seasonal allergies   . Sickle cell trait ([place])   . Termination of pregnancy (fetus) 04/02/16    Past Surgical History:  Procedure Laterality Date  . CESAREAN SECTION     2004 and 2007  . MASTECTOMY W/ SENTINEL NODE BIOPSY Right 09/06/2016   Procedure: RIGHT BREAST MASTECTOMY WITH RIGHT AXILLARY SENTINEL LYMPH NODE BIOPSY;  Surgeon: Alphonsa Overall, MD;  Location: [facility];  Service: General;  Laterality: Right;  . PORT-A-CATH  REMOVAL Left 09/06/2016   Procedure: REMOVAL PORT-A-CATH;  Surgeon: Alphonsa Overall, MD;  Location: [facility];  Service: General;  Laterality: Left;  . PORTACATH PLACEMENT    . PORTACATH PLACEMENT N/A 07/09/2019   Procedure: INSERTION PORT-A-CATH WITH ULTRASOUND;  Surgeon: Alphonsa Overall, MD;  Location: [facility] OR;  Service: General;  Laterality: N/A;    Family History  Problem Relation Age of Onset  . Hypertension Mother   . Cancer Mother        dx "intestinal cancer" in her 22s; +surgery  . Other Mother        hysterectomy at young age for unspecified cause  . Heart Problems Mother   . Breast cancer Cousin        maternal 1st cousin dx female breast cancer at 29-46y  . Cancer Father   . Hypertension Father   . Heart Problems Maternal Aunt   . Diabetes Maternal Aunt   . Breast cancer Maternal Uncle        dx 64-65  . Heart Problems Maternal Uncle   . Breast cancer Maternal Grandmother  50  . Throat cancer Maternal Grandfather        d. 34s; smoker  . Sickle cell anemia Paternal Aunt   . Congestive Heart Failure Maternal Aunt   . Multiple sclerosis Cousin   . Cancer Other        maternal great uncle (MGM's brother); cancer removed from his side  . Heart attack Paternal Aunt        d. early 51s    Social History   Socioeconomic History  . Marital status: Single    Spouse name: Not on file  . Number of children: 4  . Years of education: Not on file  . Highest education level: Not on file  Occupational History  . Occupation: Private Care Attendant    Comment: [employer]  Tobacco Use  . Smoking status: Former Smoker    Packs/day: 1.00    Years: 20.00    Pack years: 20.00    Types: Cigarettes    Quit date: 04/01/2018    Years since quitting: 2.3  . Smokeless tobacco: Never Used  . Tobacco comment: Patient has quit smoking x 1 year now  Vaping Use  . Vaping Use: Never used  Substance and Sexual Activity  . Alcohol use: Yes    Comment: occ  . Drug  use: No  . Sexual activity: Not on file  Other Topics Concern  . Not on file  Social History Narrative  . Not on file   Social Determinants of Health   Financial Resource Strain:   . Difficulty of Paying Living Expenses: Not on file  Food Insecurity:   . Worried About Charity fundraiser in the Last Year: Not on file  . Ran Out of Food in the Last Year: Not on file  Transportation Needs:   . Lack of Transportation (Medical): Not on file  . Lack of Transportation (Non-Medical): Not on file  Physical Activity:   . Days of Exercise per Week: Not on file  . Minutes of Exercise per Session: Not on file  Stress:   . Feeling of Stress : Not on file  Social Connections:   . Frequency of Communication with Friends and Family: Not on file  . Frequency of Social Gatherings with Friends and Family: Not on file  . Attends Religious Services: Not on file  . Active Member of Clubs or Organizations: Not on file  . Attends Archivist Meetings: Not on file  . Marital Status: Not on file  Intimate Partner Violence:   . Fear of Current or Ex-Partner: Not on file  . Emotionally Abused: Not on file  . Physically Abused: Not on file  . Sexually Abused: Not on file    Past Medical History, Surgical history, Social history, and Family history were reviewed and updated as appropriate.   Please see review of systems for further details on the patient's review from today.   Review of Systems:  Review of Systems  Constitutional: Positive for fever. Negative for chills and diaphoresis.  HENT: Negative for trouble swallowing and voice change.   Respiratory: Negative for cough, chest tightness, shortness of breath and wheezing.   Cardiovascular: Negative for chest pain and palpitations.  Gastrointestinal: Negative for abdominal pain, constipation, diarrhea, nausea and vomiting.  Genitourinary:       Vaginal irritation  Musculoskeletal: Negative for back pain and myalgias.  Neurological:  Negative for dizziness, light-headedness and headaches.    Objective:   Physical Exam:  There were  no vitals taken for this visit. ECOG: 1  Physical Exam Constitutional:      General: She is not in acute distress.    Appearance: She is not diaphoretic.  HENT:     Head: Normocephalic and atraumatic.     Mouth/Throat:     Mouth: Mucous membranes are moist.     Pharynx: Oropharynx is clear. No oropharyngeal exudate or posterior oropharyngeal erythema.  Eyes:     General: No scleral icterus.       Right eye: No discharge.        Left eye: No discharge.     Conjunctiva/sclera: Conjunctivae normal.  Cardiovascular:     Rate and Rhythm: Regular rhythm. Tachycardia present.     Heart sounds: Normal heart sounds. No murmur heard.  No friction rub. No gallop.   Pulmonary:     Effort: Pulmonary effort is normal. No respiratory distress.     Breath sounds: Normal breath sounds. No wheezing or rales.  Abdominal:     General: Bowel sounds are normal. There is no distension.  Skin:    General: Skin is warm and dry.     Findings: No erythema or rash.  Neurological:     Mental Status: She is alert.  Psychiatric:        Mood and Affect: Mood normal.        Behavior: Behavior normal.        Thought Content: Thought content normal.        Judgment: Judgment normal.     Lab Review:     Component Value Date/Time   NA 137 08/14/2020 1243   NA 143 10/04/2016 1154   K 3.5 08/14/2020 1243   K 3.8 10/04/2016 1154   CL 100 08/14/2020 1243   CO2 28 08/14/2020 1243   CO2 26 10/04/2016 1154   GLUCOSE 93 08/14/2020 1243   GLUCOSE 100 10/04/2016 1154   BUN 6 08/14/2020 1243   BUN 11.4 10/04/2016 1154   CREATININE 0.72 08/14/2020 1243   CREATININE 0.80 08/07/2020 1129   CREATININE 0.8 10/04/2016 1154   CALCIUM 9.2 08/14/2020 1243   CALCIUM 9.7 10/04/2016 1154   PROT 8.1 08/14/2020 1243   PROT 8.0 10/04/2016 1154   ALBUMIN 3.6 08/14/2020 1243   ALBUMIN 3.5 10/04/2016 1154   AST 14  (L) 08/14/2020 1243   AST 13 (L) 08/07/2020 1129   AST 11 10/04/2016 1154   ALT 11 08/14/2020 1243   ALT 10 08/07/2020 1129   ALT 10 10/04/2016 1154   ALKPHOS 69 08/14/2020 1243   ALKPHOS 72 10/04/2016 1154   BILITOT 0.4 08/14/2020 1243   BILITOT 0.3 08/07/2020 1129   BILITOT <0.22 10/04/2016 1154   GFRNONAA >60 08/14/2020 1243   GFRNONAA >60 08/07/2020 1129   GFRNONAA >89 01/12/2016 1115   GFRAA >60 08/14/2020 1243   GFRAA >60 08/07/2020 1129   GFRAA >89 01/12/2016 1115       Component Value Date/Time   WBC 3.2 (L) 08/14/2020 1243   RBC 3.00 (L) 08/14/2020 1243   HGB 8.8 (L) 08/14/2020 1243   HGB 9.4 (L) 08/07/2020 1129   HGB 10.4 (L) 10/04/2016 1154   HCT 26.5 (L) 08/14/2020 1243   HCT 31.3 (L) 10/04/2016 1154   PLT 222 08/14/2020 1243   PLT 309 08/07/2020 1129   PLT 320 10/04/2016 1154   MCV 88.3 08/14/2020 1243   MCV 95.7 10/04/2016 1154   MCH 29.3 08/14/2020 1243   MCHC 33.2 08/14/2020 1243  RDW 16.8 (H) 08/14/2020 1243   RDW 16.3 (H) 10/04/2016 1154   LYMPHSABS 1.3 08/14/2020 1243   LYMPHSABS 1.7 10/04/2016 1154   MONOABS 1.0 08/14/2020 1243   MONOABS 0.5 10/04/2016 1154   EOSABS 0.1 08/14/2020 1243   EOSABS 0.3 10/04/2016 1154   BASOSABS 0.0 08/14/2020 1243   BASOSABS 0.0 10/04/2016 1154   -------------------------------  Imaging from last 24 hours (if applicable):  Radiology interpretation: CT Chest W Contrast  Result Date: 08/04/2020 CLINICAL DATA:  Breast cancer, assess treatment response EXAM: CT CHEST WITH CONTRAST TECHNIQUE: Multidetector CT imaging of the chest was performed during intravenous contrast administration. CONTRAST:  74m OMNIPAQUE IOHEXOL 300 MG/ML  SOLN COMPARISON:  June 26, 2020 FINDINGS: Cardiovascular: LEFT-sided Port-A-Cath terminates at the caval to atrial junction. Heart size mildly enlarged without pericardial effusion. Aortic caliber and contour is normal. Central pulmonary vasculature is unremarkable. Mediastinum/Nodes:  Internal mammary lymph nodes may be slightly smaller than on the prior study. RIGHT internal mammary lymph node approximately 6 mm previously approximately 12 mm. Another area on image 52 series 3, approximately 7 mm as compared to 7 mm. No thoracic inlet adenopathy. Bulky LEFT axillary lymphadenopathy just deep to the margin of the pectoralis musculature (image 47, series 3) approximately 1.8 cm short axis on the prior study, on the current study this measures approximately 1.5 cm. Along the margin of the pectoralis musculature on image 65 of series 3 is a 17-18 mm lymph node, previously approximately 17 mm. Bulky adenopathy at the margin of the images, approximately 2.2 cm within 1 mm of the prior study with respect to the imaged portion of this area. Ulcerated soft tissue along the RIGHT chest wall shows a similar appearance. Discrete lymph node in the RIGHT axilla also with similar appearance. Lungs/Pleura: Airways are patent. No consolidation. No pleural effusion. Basilar atelectasis. Upper Abdomen: Mild heterogeneity of hepatic parenchyma suggests hepatic steatosis. No acute upper abdominal process to the extent evaluated with limited assessment on today's exam. Musculoskeletal: Ulcerated area along the RIGHT anterior chest wall with similar appearance. No acute musculoskeletal process. IMPRESSION: 1. Bulky LEFT axillary adenopathy with similar appearance. Perhaps 1 of these lymph nodes shows diminished size compared to the previous study as described. 2. Infiltration of the RIGHT pectoralis muscle also without interval change. Internal mammary lymph nodes may be slightly smaller than on the prior study. 3. Ulcerated soft tissue along the RIGHT anterior chest wall with similar appearance. 4. Mild heterogeneity of hepatic parenchyma suggests hepatic steatosis. 5. Aortic atherosclerosis. Aortic Atherosclerosis (ICD10-I70.0). Electronically Signed   By: GZetta BillsM.D.   On: 08/04/2020 13:30   DG Abd 2  Views  Result Date: 07/17/2020 CLINICAL DATA:  History of constipation EXAM: ABDOMEN - 2 VIEW COMPARISON:  None. FINDINGS: Scattered large and small bowel gas is noted. Mild retained fecal material is seen. No obstructive changes are noted. Mild degenerative change of the lumbar spine is seen. No free air is noted. No other focal abnormality is seen. IMPRESSION: Mild retained fecal material without obstructive change. Electronically Signed   By: MInez CatalinaM.D.   On: 07/17/2020 15:15

## 2020-08-14 NOTE — Progress Notes (Signed)
Pt discharged to ER VIA wheelchair by Shelia Media PA

## 2020-08-14 NOTE — ED Triage Notes (Signed)
Pt brought over from cancer center. Pt came in today to get chemo treatment. Pt was tachycardic and febrile initially. Bloodwork including two sets of blood cultures were drawn over at the cancer center. Pt received IV antibiotics at cancer center. Port is accessed.

## 2020-08-14 NOTE — H&P (Signed)
History and Physical    Rebecca [name] SWN:[id number] DOB: 11/23/1976 DOA: 08/14/2020  PCP: Dorena Dew, FNP  Patient coming from: Home  I have personally briefly reviewed patient's old medical records in [place]  Chief Complaint: Tachycardia  HPI: Rebecca [name] is a 44 y.o. female with medical history significant of metastatic breast cancer on chemotherapy, who presented to the cancer center today for scheduled chemotherapy.  Upon assessment, she was noted to be tachycardic and mildly febrile.  She received a dose of intravenous antibiotics (cefepime), blood cultures, basic labs were drawn and she was sent to the ER for further evaluation.  Patient denies any cough, fever, shortness of breath.  She is not had any vomiting.  She is chronically constipated.  She denies any hematuria.  She has a "funny feeling" when she passes her urine.  No abdominal pain or flank pain.  She has chronic pain in her right breast wound.  She does her own dressing changes and feels that her wound is doing well.  Describes tissue is pink.  She has not noticed any new drainage or pus.  ED Course: She is noted to be mildly febrile at 38.1 C.  She is tachycardic in the 120s to 130s.  Blood pressures currently stable.  Chest x-ray did not show any acute findings.  Covid test in process.  She was bolused IV fluids and referred for admission.  Review of Systems:  Review of Systems  Constitutional: Negative for chills, fever and malaise/fatigue.  Eyes: Negative for blurred vision and double vision.  Respiratory: Negative for cough, hemoptysis and shortness of breath.   Cardiovascular: Negative for chest pain and palpitations.  Gastrointestinal: Positive for constipation. Negative for abdominal pain, diarrhea, nausea and vomiting.  Genitourinary: Positive for dysuria. Negative for hematuria.  Musculoskeletal: Negative for myalgias.  Neurological: Negative for dizziness.       Past Medical History:  Diagnosis Date  . Anxiety   . Breast cancer ([place])   . Depression   . GERD (gastroesophageal reflux disease)   . History of radiation therapy 11/15/16-01/12/17   right chest wall and axilla treated to 45 Gy in 25 fractions, boosted and additional 14 Gy in 8 fractions  . Hypertension    diet controlled  . Obesity (BMI 35.0-39.9 without comorbidity)   . Pneumonia    as a child  . Seasonal allergies   . Sickle cell trait ([place])   . Termination of pregnancy (fetus) 04/02/16    Past Surgical History:  Procedure Laterality Date  . CESAREAN SECTION     2004 and 2007  . MASTECTOMY W/ SENTINEL NODE BIOPSY Right 09/06/2016   Procedure: RIGHT BREAST MASTECTOMY WITH RIGHT AXILLARY SENTINEL LYMPH NODE BIOPSY;  Surgeon: Alphonsa Overall, MD;  Location: [facility];  Service: General;  Laterality: Right;  . PORT-A-CATH REMOVAL Left 09/06/2016   Procedure: REMOVAL PORT-A-CATH;  Surgeon: Alphonsa Overall, MD;  Location: [facility];  Service: General;  Laterality: Left;  . PORTACATH PLACEMENT    . PORTACATH PLACEMENT N/A 07/09/2019   Procedure: INSERTION PORT-A-CATH WITH ULTRASOUND;  Surgeon: Alphonsa Overall, MD;  Location: [facility];  Service: General;  Laterality: N/A;    Social History:  reports that she quit smoking about 2 years ago. Her smoking use included cigarettes. She has a 20.00 pack-year smoking history. She has never used smokeless tobacco. She reports current alcohol use. She reports that she does not use drugs.  No Known Allergies  Family  History  Problem Relation Age of Onset  . Hypertension Mother   . Cancer Mother        dx "intestinal cancer" in her 26s; +surgery  . Other Mother        hysterectomy at young age for unspecified cause  . Heart Problems Mother   . Breast cancer Cousin        maternal 1st cousin dx female breast cancer at 61-46y  . Cancer Father   . Hypertension Father   . Heart Problems Maternal Aunt   . Diabetes  Maternal Aunt   . Breast cancer Maternal Uncle        dx 64-65  . Heart Problems Maternal Uncle   . Breast cancer Maternal Grandmother 81  . Throat cancer Maternal Grandfather        d. 8s; smoker  . Sickle cell anemia Paternal Aunt   . Congestive Heart Failure Maternal Aunt   . Multiple sclerosis Cousin   . Cancer Other        maternal great uncle (MGM's brother); cancer removed from his side  . Heart attack Paternal Aunt        d. early 68s    Prior to Admission medications   Medication Sig Start Date End Date Taking? Authorizing Provider  calcium carbonate (TUMS - DOSED IN MG ELEMENTAL CALCIUM) 500 MG chewable tablet Chew 1 tablet by mouth daily as needed for indigestion or heartburn.    [provider]  dexamethasone (DECADRON) 4 MG tablet Take 2 tablets (8 mg) daily for 3 days after chemotherapy. Take with food. 07/01/20   Magrinat, [name], MD  doxycycline (VIBRA-TABS) 100 MG tablet Take 1 tablet (100 mg total) by mouth daily. 04/21/20   Magrinat, [name], MD  fluconazole (DIFLUCAN) 200 MG tablet Take 1 tablet (200 mg total) by mouth daily. 07/31/20   Gardenia Phlegm, NP  lidocaine-prilocaine (EMLA) cream Apply to affected area once 07/01/20   Magrinat, [name], MD  loperamide (IMODIUM A-D) 2 MG tablet Take 2 at diarrhea onset, then 1 every 2hr until 12hrs with no BM. May take 2 every 4hrs at night. If diarrhea recurs repeat. Patient not taking: Reported on 08/07/2020 07/01/20   Magrinat, [name], MD  LORazepam (ATIVAN) 0.5 MG tablet Take 1 tablet (0.5 mg total) by mouth daily as needed for anxiety. 07/10/20   Causey, Charlestine Massed, NP  oxyCODONE (OXY IR/ROXICODONE) 5 MG immediate release tablet Take 1 tablet (5 mg total) by mouth every 4 (four) hours as needed for severe pain. 08/07/20   Owens Shark, NP  prochlorperazine (COMPAZINE) 10 MG tablet Take 1 tablet (10 mg total) by mouth every 6 (six) hours as needed (Nausea or vomiting). Patient not taking: Reported on  08/07/2020 07/01/20   Magrinat, [name], MD  traMADol (ULTRAM) 50 MG tablet Take 1 tablet (50 mg total) by mouth every 6 (six) hours as needed. 07/10/20   Gardenia Phlegm, NP  valACYclovir (VALTREX) 1000 MG tablet Take 1 tablet (1,000 mg total) by mouth daily. Patient not taking: Reported on 08/07/2020 12/26/19   Chauncey Cruel, MD    Physical Exam: Vitals:   08/14/20 1752 08/14/20 1800 08/14/20 1854  BP: 117/79 116/87 110/73  Pulse: (!) 131 (!) 127 (!) 124  Resp: 16 (!) 33 18  Temp: 99.9 F (37.7 C)    TempSrc: Oral    SpO2: 98% 98% 97%    Constitutional: NAD, calm, comfortable Eyes: PERRL, lids and conjunctivae normal  ENMT: Mucous membranes are moist. Posterior pharynx clear of any exudate or lesions.Normal dentition.  Neck: normal, supple, no masses, no thyromegaly Respiratory: clear to auscultation bilaterally, no wheezing, no crackles. Normal respiratory effort. No accessory muscle use.  Cardiovascular: Regular, tachycardic. No extremity edema. 2+ pedal pulses. No carotid bruits.  Abdomen: no tenderness, no masses palpated. No hepatosplenomegaly. Bowel sounds positive.  Musculoskeletal: no clubbing / cyanosis. No joint deformity upper and lower extremities. Good ROM, no contractures. Normal muscle tone.  Skin: Wound on right chest with overlying dressing.  Patient did not allow me to remove the dressing to examine the wound Neurologic: CN 2-12 grossly intact. Sensation intact, DTR normal. Strength 5/5 in all 4.  Psychiatric: Normal judgment and insight. Alert and oriented x 3. Normal mood.    Labs on Admission: I have personally reviewed following labs and imaging studies  CBC: Recent Labs  Lab 08/14/20 1243  WBC 3.2*  NEUTROABS 0.8*  HGB 8.8*  HCT 26.5*  MCV 88.3  PLT 950   Basic Metabolic Panel: Recent Labs  Lab 08/14/20 1243  NA 137  K 3.5  CL 100  CO2 28  GLUCOSE 93  BUN 6  CREATININE 0.72  CALCIUM 9.2   GFR: Estimated Creatinine Clearance:  88.5 mL/min (by C-G formula based on SCr of 0.72 mg/dL). Liver Function Tests: Recent Labs  Lab 08/14/20 1243  AST 14*  ALT 11  ALKPHOS 69  BILITOT 0.4  PROT 8.1  ALBUMIN 3.6   No results for input(s): LIPASE, AMYLASE in the last 168 hours. No results for input(s): AMMONIA in the last 168 hours. Coagulation Profile: No results for input(s): INR, PROTIME in the last 168 hours. Cardiac Enzymes: No results for input(s): CKTOTAL, CKMB, CKMBINDEX, TROPONINI in the last 168 hours. BNP (last 3 results) No results for input(s): PROBNP in the last 8760 hours. HbA1C: No results for input(s): HGBA1C in the last 72 hours. CBG: No results for input(s): GLUCAP in the last 168 hours. Lipid Profile: No results for input(s): CHOL, HDL, LDLCALC, TRIG, CHOLHDL, LDLDIRECT in the last 72 hours. Thyroid Function Tests: No results for input(s): TSH, T4TOTAL, FREET4, T3FREE, THYROIDAB in the last 72 hours. Anemia Panel: No results for input(s): VITAMINB12, FOLATE, FERRITIN, TIBC, IRON, RETICCTPCT in the last 72 hours. Urine analysis:    Component Value Date/Time   COLORURINE YELLOW 08/14/2020 1435   APPEARANCEUR CLOUDY (A) 08/14/2020 1435   LABSPEC 1.012 08/14/2020 1435   PHURINE 5.0 08/14/2020 1435   GLUCOSEU NEGATIVE 08/14/2020 1435   HGBUR NEGATIVE 08/14/2020 1435   BILIRUBINUR NEGATIVE 08/14/2020 1435   KETONESUR NEGATIVE 08/14/2020 1435   PROTEINUR 100 (A) 08/14/2020 1435   UROBILINOGEN 0.2 01/12/2016 1122   NITRITE NEGATIVE 08/14/2020 1435   LEUKOCYTESUR LARGE (A) 08/14/2020 1435    Radiological Exams on Admission: DG Chest Portable 1 View  Result Date: 08/14/2020 CLINICAL DATA:  Fever. EXAM: PORTABLE CHEST 1 VIEW COMPARISON:  Most recent chest CT 08/04/2020. FINDINGS: Accessed left chest port with tip in the SVC. Lung volumes are low. No focal airspace disease. Normal heart size with normal mediastinal contours. No pulmonary edema. No pleural effusion or pneumothorax. No acute  osseous abnormalities are seen. IMPRESSION: Low lung volumes without acute abnormality. Electronically Signed   By: Keith Rake M.D.   On: 08/14/2020 18:30    EKG: Independently reviewed.  Sinus tachycardia  Assessment/Plan Active Problems:   Malignant neoplasm of upper-outer quadrant of right breast in female, estrogen receptor negative (HCC)   Neutropenic  fever ([place])   Sepsis ([place])   Acute lower UTI   Open chest wound, right, initial encounter   Anemia associated with chemotherapy     1. Sepsis.  Probably from urinary source.  Urine culture in process.  Blood cultures have already been sent.  She has been started on intravenous antibiotics.  Patient continues to be tachycardic, was febrile on admission, has leukopenia.  Lactic acid is noted to be normal.  Follow-up cultures and continue on Rocephin.  Chest survey did not show any evidence of pneumonia.  Covid testing process. 2. Urinary tract infection.  Currently on ceftriaxone.  Follow-up urine cultures 3. Neutropenic fever.  Related to recent chemotherapy and underlying infectious process.  She received a dose of filgrastim at the cancer center earlier today.  This dose may need to be repeated tomorrow.  Continue to follow [place]. 4. Metastatic breast cancer with mets to bone.  Continue to follow-up with Dr. [name] as scheduled 5. Anemia, related to chemotherapy.  No reported bleeding.  Transfuse for hemoglobin less than 7. 6. Chronic right chest wound.  Patient does her own dressing changes with wet-to-dry dressings.  Denies any recent change in her wound, odor or purulent discharge.  She did not allow me to examine her wound during my visit today since she is worried about pain with pulling back her dressing.  We will try and medicate her prior to pulling back her dressing.  Will request wound care consult further evaluation  DVT prophylaxis: Lovenox Code Status: Full code Family Communication: Offered to inform patient's family,  but she says she is already spoken to her mother. Disposition Plan: Discharge home once sepsis physiology has improved Consults called:   Admission status: Inpatient, progressive care  Severity of Illness: The appropriate patient status for this patient is INPATIENT. Inpatient status is judged to be reasonable and necessary in order to provide the required intensity of service to ensure the patient's safety. The patient's presenting symptoms, physical exam findings, and initial radiographic and laboratory data in the context of their chronic comorbidities is felt to place them at high risk for further clinical deterioration. Furthermore, it is not anticipated that the patient will be medically stable for discharge from the hospital within 2 midnights of admission. The following factors support the patient status of inpatient.    "           The patient's presenting symptoms include  fever "           The worrisome physical exam findings include  fever, tachycardia "           The initial radiographic and laboratory data are worrisome because of  neutropenia, anemia, urinalysis indicative of infection "           The chronic co-morbidities include  metastatic breast cancer, immunosuppression on chemotherapy     * I certify that at the point of admission it is my clinical judgment that the patient will require inpatient hospital care spanning beyond 2 midnights from the point of admission due to high intensity of service, high risk for further deterioration and high frequency of surveillance required.Kathie Dike MD [facility]   If 7PM-7AM, please contact night-coverage [email or web address]   08/14/2020, 7:47 PM

## 2020-08-14 NOTE — Progress Notes (Signed)
Upon arrival to the cancer center, pt reports chronic pain in her R breast, but otherwise has no complaints. Labs results showed a [place] of .8. HR 135 and temp 100.3. Pt's MD contacted, but was not in-house. Sandi Mealy notified of patient's status.  Cultures obtained, Zarxio and Cefepime given per MAR.

## 2020-08-14 NOTE — Plan of Care (Signed)
  Problem: Education: Goal: Knowledge of General Education information will improve Description: Including pain rating scale, medication(s)/side effects and non-pharmacologic comfort measures Outcome: Progressing   Problem: Education: Goal: Knowledge of General Education information will improve Description: Including pain rating scale, medication(s)/side effects and non-pharmacologic comfort measures Outcome: Progressing   Problem: Clinical Measurements: Goal: Will remain free from infection Outcome: Progressing   Problem: Nutrition: Goal: Adequate nutrition will be maintained Outcome: Progressing   Problem: Coping: Goal: Level of anxiety will decrease Outcome: Progressing   Problem: Pain Managment: Goal: General experience of comfort will improve Outcome: Progressing   Problem: Safety: Goal: Ability to remain free from injury will improve Outcome: Progressing   Problem: Skin Integrity: Goal: Risk for impaired skin integrity will decrease Outcome: Progressing

## 2020-08-14 NOTE — Patient Instructions (Signed)

## 2020-08-15 DIAGNOSIS — R5081 Fever presenting with conditions classified elsewhere: Secondary | ICD-10-CM | POA: Diagnosis not present

## 2020-08-15 DIAGNOSIS — D709 Neutropenia, unspecified: Secondary | ICD-10-CM | POA: Diagnosis not present

## 2020-08-15 DIAGNOSIS — N12 Tubulo-interstitial nephritis, not specified as acute or chronic: Secondary | ICD-10-CM | POA: Diagnosis not present

## 2020-08-15 DIAGNOSIS — N390 Urinary tract infection, site not specified: Secondary | ICD-10-CM | POA: Diagnosis not present

## 2020-08-15 LAB — COMPREHENSIVE METABOLIC PANEL
ALT: 13 U/L (ref 0–44)
AST: 14 U/L — ABNORMAL LOW (ref 15–41)
Albumin: 3.3 g/dL — ABNORMAL LOW (ref 3.5–5.0)
Alkaline Phosphatase: 53 U/L (ref 38–126)
Anion gap: 12 (ref 5–15)
BUN: 8 mg/dL (ref 6–20)
CO2: 28 mmol/L (ref 22–32)
Calcium: 8.6 mg/dL — ABNORMAL LOW (ref 8.9–10.3)
Chloride: 101 mmol/L (ref 98–111)
Creatinine, Ser: 0.68 mg/dL (ref 0.44–1.00)
GFR calc Af Amer: >60 mL/min (ref >60)
GFR calc non Af Amer: >60 mL/min (ref >60)
Glucose, Bld: 120 mg/dL — ABNORMAL HIGH (ref 70–99)
Potassium: 3.2 mmol/L — ABNORMAL LOW (ref 3.5–5.1)
Sodium: 141 mmol/L (ref 135–145)
Total Bilirubin: 0.5 mg/dL (ref 0.3–1.2)
Total Protein: 6.6 g/dL (ref 6.5–8.1)

## 2020-08-15 LAB — CBC WITH DIFFERENTIAL/PLATELET
Abs Immature Granulocytes: 0.06 10*3/uL (ref 0.00–0.07)
Basophils Absolute: 0 10*3/uL (ref 0.0–0.1)
Basophils Relative: 1 %
Eosinophils Absolute: 0.1 10*3/uL (ref 0.0–0.5)
Eosinophils Relative: 1 %
HCT: 23.6 % — ABNORMAL LOW (ref 36.0–46.0)
Hemoglobin: 7.7 g/dL — ABNORMAL LOW (ref 12.0–15.0)
Immature Granulocytes: 1 %
Lymphocytes Relative: 26 %
Lymphs Abs: 1.5 10*3/uL (ref 0.7–4.0)
MCH: 29.8 pg (ref 26.0–34.0)
MCHC: 32.6 g/dL (ref 30.0–36.0)
MCV: 91.5 fL (ref 80.0–100.0)
Monocytes Absolute: 1.3 10*3/uL — ABNORMAL HIGH (ref 0.1–1.0)
Monocytes Relative: 22 %
Neutro Abs: 2.9 10*3/uL (ref 1.7–7.7)
Neutrophils Relative %: 49 %
Platelets: 211 10*3/uL (ref 150–400)
RBC: 2.58 MIL/uL — ABNORMAL LOW (ref 3.87–5.11)
RDW: 17.2 % — ABNORMAL HIGH (ref 11.5–15.5)
WBC: 5.8 10*3/uL (ref 4.0–10.5)
nRBC: 0 % (ref 0.0–0.2)

## 2020-08-15 LAB — CORTISOL-AM, BLOOD: Cortisol - AM: 7.3 ug/dL (ref 6.7–22.6)

## 2020-08-15 LAB — HIV ANTIBODY (ROUTINE TESTING W REFLEX): HIV Screen 4th Generation wRfx: NONREACTIVE

## 2020-08-15 LAB — PROTIME-INR
INR: 1.5 — ABNORMAL HIGH (ref 0.8–1.2)
Prothrombin Time: 17.3 seconds — ABNORMAL HIGH (ref 11.4–15.2)

## 2020-08-15 LAB — PROCALCITONIN: Procalcitonin: <0.1 ng/mL

## 2020-08-15 MED ORDER — POTASSIUM CHLORIDE CRYS ER 20 MEQ PO TBCR
40.0000 meq | EXTENDED_RELEASE_TABLET | Freq: Once | ORAL | Status: AC
  Administered 2020-08-15: 40 meq via ORAL
  Filled 2020-08-15: qty 2

## 2020-08-15 MED ORDER — ENSURE ENLIVE PO LIQD: 237.0000 mL | Freq: Two times a day (BID) | ORAL | Status: DC

## 2020-08-15 MED ORDER — CHLORHEXIDINE GLUCONATE CLOTH 2 % EX PADS
6.0000 | MEDICATED_PAD | Freq: Every day | CUTANEOUS | Status: DC
  Administered 2020-08-16 – 2020-08-17 (×2): 6 via TOPICAL

## 2020-08-15 MED ORDER — SODIUM CHLORIDE 0.9% FLUSH: 10.0000 mL | INTRAVENOUS | Status: DC | PRN

## 2020-08-15 MED ORDER — SODIUM CHLORIDE 0.9 % IV SOLN
2.0000 g | INTRAVENOUS | Status: DC
  Administered 2020-08-15 – 2020-08-16 (×2): 2 g via INTRAVENOUS
  Filled 2020-08-15 (×2): qty 2

## 2020-08-15 MED ORDER — POLYETHYLENE GLYCOL 3350 17 G PO PACK
17.0000 g | PACK | Freq: Every day | ORAL | Status: DC
  Administered 2020-08-15 – 2020-08-17 (×3): 17 g via ORAL
  Filled 2020-08-15 (×3): qty 1

## 2020-08-15 MED ORDER — SODIUM CHLORIDE 0.9% FLUSH
10.0000 mL | Freq: Two times a day (BID) | INTRAVENOUS | Status: DC
  Administered 2020-08-17: 10 mL

## 2020-08-15 MED ORDER — COSYNTROPIN 0.25 MG IJ SOLR
0.2500 mg | Freq: Once | INTRAMUSCULAR | Status: AC
  Administered 2020-08-16: 0.25 mg via INTRAVENOUS
  Filled 2020-08-15: qty 0.25

## 2020-08-15 NOTE — Progress Notes (Signed)
[facility] Progress Note  Patient: Rebecca [name]    VHQ:[id number]  DOA: 08/14/2020     Date of Service: the patient was seen and examined on 08/15/2020  Brief hospital course: Past medical history of metastatic breast cancer chemotherapy presents with complaints of fever and tachycardia.  Currently being treated for febrile neutropenia. Currently plan is continue IV biotics.  Assessment and Plan: 1.  Sepsis POA likely from UTI Febrile neutropenia Presents with tachycardia, neutropenia and fever meeting SIRS criteria. UA shows evidence of pyuria. Urine culture blood culture currently pending. Patient received a dose of filgrastim at cancer center on 08/14/2020. Counts elevated appropriately. Continue with IV ceftriaxone for now.  Dose increased to 2 g.  2.  Metastatic breast cancer with mets to bone Follows up with oncology outpatient. Currently on chemotherapy. Chemotherapy currently on hold due to presentation with neutropenic fever. Continue wound care of the breast cancer site  3.  Anemia of chemotherapy. H&H stable. Monitor. Proximal globin less than 7.  4.  Hypokalemia Currently being replaced.  5.  Elevated INR Monitor for now.  6.  Obesity Body mass index is 37.46 kg/m.  At risk for protein calorie malnutrition given her present cancer Weight 227 pound in 2019, currently 191 pound on admission. Monitor.  7.  Low cortisol level. Add cosyntropin stimulation test.  8.  Constipation. MiraLAX.  Diet: Regular diet DVT Prophylaxis:   enoxaparin (LOVENOX) injection 40 mg Start: 08/14/20 2200    Advance goals of care discussion: Full code  Family Communication: no family was present at bedside, at the time of interview.   Disposition:  Status is: Inpatient  Remains inpatient appropriate because:Inpatient level of care appropriate due to severity of illness   Dispo: The patient is from: Home              Anticipated d/c is to: Home               Anticipated d/c date is: 2 days              Patient currently is not medically stable to d/c.  Subjective: Denies any acute complaint.  No nausea vomiting or no p.o. no chills.  Physical Exam:  General: Appear in mild distress, no Rash; Oral Mucosa Clear, moist. no Abnormal Neck Mass Or lumps, Conjunctiva normal  Cardiovascular: S1 and S2 Present, no Murmur, Respiratory: good respiratory effort, Bilateral Air entry present and CTA, no Crackles, no wheezes Abdomen: Bowel Sound present, Soft and no tenderness Extremities: no Pedal edema Neurology: alert and oriented to time, place, and person affect appropriate. no new focal deficit Gait not checked due to patient safety concerns  Vitals:   08/15/20 0056 08/15/20 0311 08/15/20 0502 08/15/20 0645  BP: 110/70 113/77 103/71 103/71  Pulse: (!) 113 (!) 105 (!) 106 (!) 107  Resp: 17 15 15 16   Temp: 99.5 F (37.5 C) 99.1 F (37.3 C) 98.6 F (37 C) 98.9 F (37.2 C)  TempSrc: Oral Oral Oral Oral  SpO2: 97% 100% 100% 100%  Weight:      Height:        Intake/Output Summary (Last 24 hours) at 08/15/2020 1832 Last data filed at 08/15/2020 0200 Gross per 24 hour  Intake 350 ml  Output --  Net 350 ml   Filed Weights   08/14/20 2047  Weight: 87 kg    Data Reviewed: I have personally reviewed and interpreted daily labs, tele strips, imagings as discussed above. I reviewed all nursing  notes, pharmacy notes, vitals, pertinent old records I have discussed plan of care as described above with RN and patient/family.  CBC: Recent Labs  Lab 08/14/20 1243 08/14/20 2106 08/15/20 0400  WBC 3.2* 4.8 5.8  NEUTROABS 0.8*  --  2.9  HGB 8.8* 8.0* 7.7*  HCT 26.5* 24.0* 23.6*  MCV 88.3 90.6 91.5  PLT 222 192 144   Basic Metabolic Panel: Recent Labs  Lab 08/14/20 1243 08/14/20 2106 08/15/20 0400  NA 137  --  141  K 3.5  --  3.2*  CL 100  --  101  CO2 28  --  28  GLUCOSE 93  --  120*  BUN 6  --  8  CREATININE 0.72 0.66  0.68  CALCIUM 9.2  --  8.6*    Studies: No results found.  Scheduled Meds: . Chlorhexidine Gluconate Cloth  6 each Topical Daily  . [START ON 08/16/2020] cosyntropin  0.25 mg Intravenous Once  . enoxaparin (LOVENOX) injection  40 mg Subcutaneous Q24H  . feeding supplement (ENSURE ENLIVE)  237 mL Oral BID BM  . polyethylene glycol  17 g Oral Daily  . sodium chloride flush  10-40 mL Intracatheter Q12H   Continuous Infusions: . cefTRIAXone (ROCEPHIN)  IV     PRN Meds: acetaminophen **OR** acetaminophen, LORazepam, ondansetron **OR** ondansetron (ZOFRAN) IV, oxyCODONE, sodium chloride flush  Time spent: 35 minutes  Author: Berle Mull, MD [facility] 08/15/2020 6:32 PM  To reach On-call, see care teams to locate the attending and reach out via [email or web address]. Between 7PM-7AM, please contact night-coverage If you still have difficulty reaching the attending provider, please page the [facility] (Director on Call) for [facility] on amion for assistance.

## 2020-08-15 NOTE — Progress Notes (Signed)
   08/15/20 2039  Assess: MEWS Score  Temp 98.7 F (37.1 C)  BP 111/82  Pulse Rate (!) 115  Resp 20  Level of Consciousness Alert  SpO2 100 %  O2 Device Room Air  Assess: MEWS Score  MEWS Temp 0  MEWS Systolic 0  MEWS Pulse 2  MEWS RR 0  MEWS LOC 0  MEWS Score 2  MEWS Score Color Yellow  Assess: if the MEWS score is Yellow or Red  Were vital signs taken at a resting state? Yes  Focused Assessment No change from prior assessment  Early Detection of Sepsis Score *See Row Information* Medium  MEWS guidelines implemented *See Row Information* Yes  Treat  MEWS Interventions Administered scheduled meds/treatments  Pain Scale 0-10  Pain Score 0  Take Vital Signs  Increase Vital Sign Frequency  Yellow: Q 2hr X 2 then Q 4hr X 2, if remains yellow, continue Q 4hrs  Escalate  MEWS: Escalate Yellow: discuss with charge nurse/RN and consider discussing with provider and RRT  Notify: Charge Nurse/RN  Name of Charge Nurse/RN Notified michelle  Date Charge Nurse/RN Notified 08/15/20  Time Charge Nurse/RN Notified 2039  patient has been tachy ranging from 117-105 throughout admission.  Patient is asymptomatic resitng in the recliner. Will implement yellow mews protocol

## 2020-08-16 DIAGNOSIS — R5081 Fever presenting with conditions classified elsewhere: Secondary | ICD-10-CM | POA: Diagnosis not present

## 2020-08-16 DIAGNOSIS — N390 Urinary tract infection, site not specified: Secondary | ICD-10-CM | POA: Diagnosis not present

## 2020-08-16 DIAGNOSIS — N12 Tubulo-interstitial nephritis, not specified as acute or chronic: Secondary | ICD-10-CM

## 2020-08-16 DIAGNOSIS — D709 Neutropenia, unspecified: Secondary | ICD-10-CM | POA: Diagnosis not present

## 2020-08-16 LAB — COMPREHENSIVE METABOLIC PANEL
ALT: 14 U/L (ref 0–44)
AST: 14 U/L — ABNORMAL LOW (ref 15–41)
Albumin: 3.5 g/dL (ref 3.5–5.0)
Alkaline Phosphatase: 61 U/L (ref 38–126)
Anion gap: 12 (ref 5–15)
BUN: 7 mg/dL (ref 6–20)
CO2: 27 mmol/L (ref 22–32)
Calcium: 8.8 mg/dL — ABNORMAL LOW (ref 8.9–10.3)
Chloride: 101 mmol/L (ref 98–111)
Creatinine, Ser: 0.58 mg/dL (ref 0.44–1.00)
GFR calc Af Amer: >60 mL/min (ref >60)
GFR calc non Af Amer: >60 mL/min (ref >60)
Glucose, Bld: 92 mg/dL (ref 70–99)
Potassium: 3.4 mmol/L — ABNORMAL LOW (ref 3.5–5.1)
Sodium: 140 mmol/L (ref 135–145)
Total Bilirubin: 0.3 mg/dL (ref 0.3–1.2)
Total Protein: 7.4 g/dL (ref 6.5–8.1)

## 2020-08-16 LAB — CBC WITH DIFFERENTIAL/PLATELET
Abs Immature Granulocytes: 0.17 10*3/uL — ABNORMAL HIGH (ref 0.00–0.07)
Basophils Absolute: 0.1 10*3/uL (ref 0.0–0.1)
Basophils Relative: 1 %
Eosinophils Absolute: 0.1 10*3/uL (ref 0.0–0.5)
Eosinophils Relative: 1 %
HCT: 23.8 % — ABNORMAL LOW (ref 36.0–46.0)
Hemoglobin: 7.7 g/dL — ABNORMAL LOW (ref 12.0–15.0)
Immature Granulocytes: 1 %
Lymphocytes Relative: 16 %
Lymphs Abs: 2.1 10*3/uL (ref 0.7–4.0)
MCH: 29.6 pg (ref 26.0–34.0)
MCHC: 32.4 g/dL (ref 30.0–36.0)
MCV: 91.5 fL (ref 80.0–100.0)
Monocytes Absolute: 1.5 10*3/uL — ABNORMAL HIGH (ref 0.1–1.0)
Monocytes Relative: 11 %
Neutro Abs: 9.5 10*3/uL — ABNORMAL HIGH (ref 1.7–7.7)
Neutrophils Relative %: 70 %
Platelets: 203 10*3/uL (ref 150–400)
RBC: 2.6 MIL/uL — ABNORMAL LOW (ref 3.87–5.11)
RDW: 17.3 % — ABNORMAL HIGH (ref 11.5–15.5)
WBC: 13.4 10*3/uL — ABNORMAL HIGH (ref 4.0–10.5)
nRBC: 0 % (ref 0.0–0.2)

## 2020-08-16 LAB — ACTH STIMULATION, 3 TIME POINTS
Cortisol, 30 Min: 28.9 ug/dL
Cortisol, 60 Min: 34.8 ug/dL
Cortisol, Base: 4.3 ug/dL

## 2020-08-16 LAB — MAGNESIUM: Magnesium: 1.1 mg/dL — ABNORMAL LOW (ref 1.7–2.4)

## 2020-08-16 MED ORDER — POTASSIUM CHLORIDE CRYS ER 20 MEQ PO TBCR
40.0000 meq | EXTENDED_RELEASE_TABLET | Freq: Once | ORAL | Status: AC
  Administered 2020-08-16: 40 meq via ORAL
  Filled 2020-08-16: qty 2

## 2020-08-16 MED ORDER — MAGNESIUM SULFATE 2 GM/50ML IV SOLN
2.0000 g | Freq: Once | INTRAVENOUS | Status: AC
  Administered 2020-08-16: 2 g via INTRAVENOUS
  Filled 2020-08-16: qty 50

## 2020-08-16 MED ORDER — BOOST / RESOURCE BREEZE PO LIQD CUSTOM
1.0000 | Freq: Three times a day (TID) | ORAL | Status: DC
  Administered 2020-08-16 – 2020-08-17 (×4): 1 via ORAL

## 2020-08-16 NOTE — Progress Notes (Signed)
[facility] Progress Note  Patient: Rebecca [name]    BJS:[id number]  DOA: 08/14/2020     Date of Service: the patient was seen and examined on 08/16/2020  Brief hospital course: Past medical history of metastatic breast cancer chemotherapy presents with complaints of fever and tachycardia.  Currently being treated for febrile neutropenia. Currently plan is continue IV biotics.  Assessment and Plan: 1.  Sepsis POA likely from UTI Febrile neutropenia Presents with tachycardia, neutropenia and fever meeting SIRS criteria. UA shows evidence of pyuria. Urine culture blood culture currently pending. Patient received a dose of filgrastim at cancer center on 08/14/2020. Counts elevated appropriately. Continue with IV ceftriaxone for now.  Dose increased to 2 g.  2.  Metastatic breast cancer with mets to bone Follows up with oncology outpatient. Currently on chemotherapy. Chemotherapy currently on hold due to presentation with neutropenic fever. Continue wound care of the breast cancer site  3.  Anemia of chemotherapy. H&H stable. Monitor. Proximal globin less than 7.  4.  Hypokalemia Currently being replaced.  5.  Elevated INR Monitor for now.  6.  Obesity Body mass index is 37.46 kg/m.  At risk for protein calorie malnutrition given her present cancer Weight 227 pound in 2019, currently 191 pound on admission. Monitor.  7.  Low cortisol level. Cosyntropin stimulation test is negative. Continue to monitor for now.  8.  Constipation. MiraLAX.  Diet: Regular diet DVT Prophylaxis:   enoxaparin (LOVENOX) injection 40 mg Start: 08/14/20 2200    Advance goals of care discussion: Full code  Family Communication: no family was present at bedside, at the time of interview.   Disposition:  Status is: Inpatient  Remains inpatient appropriate because:Inpatient level of care appropriate due to severity of illness   Dispo: The patient is from: Home               Anticipated d/c is to: Home              Anticipated d/c date is: 2 days              Patient currently is not medically stable to d/c.  Subjective: No fever no chills.  No chest pain.  No abdominal pain.  Walking in the room.  Physical Exam:  General: Appear in mild distress, no Rash; Oral Mucosa Clear, moist. no Abnormal Neck Mass Or lumps, Conjunctiva normal  Cardiovascular: S1 and S2 Present, no Murmur, Respiratory: good respiratory effort, Bilateral Air entry present and CTA, no Crackles, no wheezes Abdomen: Bowel Sound present, Soft and no tenderness Extremities: no Pedal edema Neurology: alert and oriented to time, place, and person affect appropriate. no new focal deficit Gait not checked due to patient safety concerns  Vitals:   08/16/20 0448 08/16/20 0838 08/16/20 1403 08/16/20 2026  BP: 115/79 114/78 115/72 110/66  Pulse: (!) 105 (!) 115 (!) 109 (!) 109  Resp: 18 16 16 18   Temp: 98.8 F (37.1 C) 98.2 F (36.8 C) 98.6 F (37 C) 98.4 F (36.9 C)  TempSrc: Oral Oral Oral Oral  SpO2: 98% 99% 100% 97%  Weight:      Height:        Intake/Output Summary (Last 24 hours) at 08/16/2020 2112 Last data filed at 08/16/2020 1104 Gross per 24 hour  Intake 390 ml  Output --  Net 390 ml   Filed Weights   08/14/20 2047  Weight: 87 kg    Data Reviewed: I have personally reviewed and interpreted daily labs,  tele strips, imagings as discussed above. I reviewed all nursing notes, pharmacy notes, vitals, pertinent old records I have discussed plan of care as described above with RN and patient/family.  CBC: Recent Labs  Lab 08/14/20 1243 08/14/20 2106 08/15/20 0400 08/16/20 0426  WBC 3.2* 4.8 5.8 13.4*  NEUTROABS 0.8*  --  2.9 9.5*  HGB 8.8* 8.0* 7.7* 7.7*  HCT 26.5* 24.0* 23.6* 23.8*  MCV 88.3 90.6 91.5 91.5  PLT 222 192 211 624   Basic Metabolic Panel: Recent Labs  Lab 08/14/20 1243 08/14/20 2106 08/15/20 0400 08/16/20 0426  NA 137  --  141 140  K 3.5   --  3.2* 3.4*  CL 100  --  101 101  CO2 28  --  28 27  GLUCOSE 93  --  120* 92  BUN 6  --  8 7  CREATININE 0.72 0.66 0.68 0.58  CALCIUM 9.2  --  8.6* 8.8*  MG  --   --   --  1.1*    Studies: No results found.  Scheduled Meds:  Chlorhexidine Gluconate Cloth  6 each Topical Daily   enoxaparin (LOVENOX) injection  40 mg Subcutaneous Q24H   feeding supplement  1 Container Oral TID BM   polyethylene glycol  17 g Oral Daily   sodium chloride flush  10-40 mL Intracatheter Q12H   Continuous Infusions:  cefTRIAXone (ROCEPHIN)  IV 2 g (08/15/20 2114)   PRN Meds: acetaminophen **OR** acetaminophen, LORazepam, ondansetron **OR** ondansetron (ZOFRAN) IV, oxyCODONE, sodium chloride flush  Time spent: 35 minutes  Author: Berle Mull, MD [facility] 08/16/2020 9:12 PM  To reach On-call, see care teams to locate the attending and reach out via [email or web address]. Between 7PM-7AM, please contact night-coverage If you still have difficulty reaching the attending provider, please page the [facility] (Director on Call) for [facility] on amion for assistance.

## 2020-08-16 NOTE — Progress Notes (Signed)
   08/16/20 0838  Assess: MEWS Score  Temp 98.2 F (36.8 C)  BP 114/78  Pulse Rate (!) 115  Resp 16  SpO2 99 %  O2 Device Room Air  Assess: MEWS Score  MEWS Temp 0  MEWS Systolic 0  MEWS Pulse 2  MEWS RR 0  MEWS LOC 0  MEWS Score 2  MEWS Score Color Yellow  Assess: if the MEWS score is Yellow or Red  Were vital signs taken at a resting state? Yes  Focused Assessment No change from prior assessment  Early Detection of Sepsis Score *See Row Information* Low  MEWS guidelines implemented *See Row Information* No, previously yellow, continue vital signs every 4 hours

## 2020-08-17 ENCOUNTER — Inpatient Hospital Stay: Payer: Medicaid Other

## 2020-08-17 DIAGNOSIS — N12 Tubulo-interstitial nephritis, not specified as acute or chronic: Secondary | ICD-10-CM | POA: Diagnosis not present

## 2020-08-17 DIAGNOSIS — Z171 Estrogen receptor negative status [ER-]: Secondary | ICD-10-CM | POA: Diagnosis not present

## 2020-08-17 DIAGNOSIS — C50411 Malignant neoplasm of upper-outer quadrant of right female breast: Secondary | ICD-10-CM | POA: Diagnosis not present

## 2020-08-17 DIAGNOSIS — N390 Urinary tract infection, site not specified: Secondary | ICD-10-CM

## 2020-08-17 DIAGNOSIS — D709 Neutropenia, unspecified: Secondary | ICD-10-CM

## 2020-08-17 DIAGNOSIS — R5081 Fever presenting with conditions classified elsewhere: Secondary | ICD-10-CM | POA: Diagnosis not present

## 2020-08-17 LAB — URINE CULTURE: Culture: >=100000 — AB

## 2020-08-17 MED ORDER — HEPARIN SOD (PORK) LOCK FLUSH 100 UNIT/ML IV SOLN
500.0000 [IU] | INTRAVENOUS | Status: AC | PRN
  Administered 2020-08-17: 500 [IU]
  Filled 2020-08-17: qty 5

## 2020-08-17 MED ORDER — POLYETHYLENE GLYCOL 3350 17 G PO PACK: 17.0000 g | PACK | Freq: Every day | ORAL | 0 refills | Status: DC

## 2020-08-17 MED ORDER — SULFAMETHOXAZOLE-TRIMETHOPRIM 800-160 MG PO TABS: 1.0000 | ORAL_TABLET | Freq: Two times a day (BID) | ORAL | 0 refills | Status: AC

## 2020-08-17 MED ORDER — SULFAMETHOXAZOLE-TRIMETHOPRIM 800-160 MG PO TABS
1.0000 | ORAL_TABLET | Freq: Two times a day (BID) | ORAL | Status: DC
  Administered 2020-08-17: 1 via ORAL
  Filled 2020-08-17: qty 1

## 2020-08-17 NOTE — Progress Notes (Signed)
Dr, [name], Maude Leriche

## 2020-08-17 NOTE — Plan of Care (Signed)
  Problem: Clinical Measurements: ?Goal: Cardiovascular complication will be avoided ?Outcome: Progressing ?  ?Problem: Activity: ?Goal: Risk for activity intolerance will decrease ?Outcome: Progressing ?  ?Problem: Nutrition: ?Goal: Adequate nutrition will be maintained ?Outcome: Progressing ?  ?Problem: Safety: ?Goal: Ability to remain free from injury will improve ?Outcome: Progressing ?  ?

## 2020-08-17 NOTE — Plan of Care (Signed)
  Problem: Clinical Measurements: Goal: Diagnostic test results will improve Outcome: Progressing Goal: Respiratory complications will improve Outcome: Progressing Goal: Cardiovascular complication will be avoided Outcome: Progressing   Problem: Elimination: Goal: Will not experience complications related to bowel motility Outcome: Progressing Goal: Will not experience complications related to urinary retention Outcome: Progressing   Problem: Safety: Goal: Ability to remain free from injury will improve Outcome: Progressing

## 2020-08-17 NOTE — Progress Notes (Signed)
Rebecca [name]   DOB:May 16, 1976   OZ#:[id number]   NGE#:[id number]  Subjective:  Rebecca [name] was found to be febrile and neutropenic although asymptomatic when she presented for treatment our clinic on 08/14/2020.  She was referred to the ED, admitted, and cultures have grown E. coli with sensitivities pending.  She has been treated with ceftriaxone with clinical improvement.  Her CBC yesterday shows a white cell count of 13.4 with 9.5 neutrophils.   Objective: African-American [name] examined in recliner Vitals:   08/17/20 0211 08/17/20 0533  BP: 113/75 (!) 105/57  Pulse: 96 89  Resp: 18 16  Temp: 98.5 F (36.9 C) 97.9 F (36.6 C)  SpO2: 93% 100%    Body mass index is 37.46 kg/m.  Intake/Output Summary (Last 24 hours) at 08/17/2020 0744 Last data filed at 08/16/2020 2300 Gross per 24 hour  Intake 630 ml  Output --  Net 630 ml     Right chest wall wound bandaged over, clean and dry.  In the left breast there are hard nodules consistent with metastatic breast cancer deposits.  CBG (last 3)  No results for input(s): GLUCAP in the last 72 hours.   Labs:  Lab Results  Component Value Date   WBC 13.4 (H) 08/16/2020   HGB 7.7 (L) 08/16/2020   HCT 23.8 (L) 08/16/2020   MCV 91.5 08/16/2020   PLT 203 08/16/2020   NEUTROABS 9.5 (H) 08/16/2020    _0 @  Urine Studies No results for input(s): UHGB, CRYS in the last 72 hours.  Invalid input(s): UACOL, UAPR, USPG, UPH, UTP, UGL, UKET, UBIL, UNIT, UROB, ULEU, UEPI, UWBC, URBC, UBAC, CAST, [place], [place]  Basic Metabolic Panel: Recent Labs  Lab 08/14/20 1243 08/14/20 1243 08/14/20 2106 08/15/20 0400 08/16/20 0426  NA 137  --   --  141 140  K 3.5   < >  --  3.2* 3.4*  CL 100  --   --  101 101  CO2 28  --   --  28 27  GLUCOSE 93  --   --  120* 92  BUN 6  --   --  8 7  CREATININE 0.72  --  0.66 0.68 0.58  CALCIUM 9.2  --   --  8.6* 8.8*  MG  --   --   --   --  1.1*   < > = values in this interval not displayed.    GFR Estimated Creatinine Clearance: 86.1 mL/min (by C-G formula based on SCr of 0.58 mg/dL). Liver Function Tests: Recent Labs  Lab 08/14/20 1243 08/15/20 0400 08/16/20 0426  AST 14* 14* 14*  ALT _1 ALKPHOS 69 53 61  BILITOT 0.4 0.5 0.3  PROT 8.1 6.6 7.4  ALBUMIN 3.6 3.3* 3.5   No results for input(s): LIPASE, AMYLASE in the last 168 hours. No results for input(s): AMMONIA in the last 168 hours. Coagulation profile Recent Labs  Lab 08/15/20 0400  INR 1.5*    CBC: Recent Labs  Lab 08/14/20 1243 08/14/20 2106 08/15/20 0400 08/16/20 0426  WBC 3.2* 4.8 5.8 13.4*  NEUTROABS 0.8*  --  2.9 9.5*  HGB 8.8* 8.0* 7.7* 7.7*  HCT 26.5* 24.0* 23.6* 23.8*  MCV 88.3 90.6 91.5 91.5  PLT 222 192 211 203   Cardiac Enzymes: No results for input(s): CKTOTAL, CKMB, CKMBINDEX, TROPONINI in the last 168 hours. BNP: Invalid input(s): POCBNP CBG: No results for input(s): GLUCAP in the last 168 hours. D-Dimer No results for input(s): DDIMER  in the last 72 hours. Hgb A1c No results for input(s): HGBA1C in the last 72 hours. Lipid Profile No results for input(s): CHOL, HDL, LDLCALC, TRIG, CHOLHDL, LDLDIRECT in the last 72 hours. Thyroid function studies No results for input(s): TSH, T4TOTAL, T3FREE, THYROIDAB in the last 72 hours.  Invalid input(s): FREET3 Anemia work up No results for input(s): VITAMINB12, FOLATE, FERRITIN, TIBC, IRON, RETICCTPCT in the last 72 hours. Microbiology Recent Results (from the past 240 hour(s))  Urine Culture     Status: Abnormal (Preliminary result)   Collection Time: 08/14/20  2:34 PM   Specimen: Urine, Clean Catch  Result Value Ref Range Status   Specimen Description   Final    URINE, CLEAN CATCH Performed at [facility], [place] [address] [place], [place] [address]    Special Requests   Final    NONE Performed at [facility], [place] [address], [place], [place] [address]    Culture (A)   Final    >=100,000 COLONIES/mL ESCHERICHIA COLI SUSCEPTIBILITIES TO FOLLOW Performed at [facility], [place] [address] [place], [place] [address]    Report Status PENDING  Incomplete  Culture, Blood     Status: None (Preliminary result)   Collection Time: 08/14/20  2:40 PM   Specimen: BLOOD  Result Value Ref Range Status   Specimen Description BLOOD LEFT ANTECUBITAL  Final   Special Requests   Final    BOTTLES DRAWN AEROBIC AND ANAEROBIC Blood Culture results may not be optimal due to an excessive volume of blood received in culture bottles   Culture   Final    NO GROWTH 2 DAYS Performed at [facility], [place] [address], [place], [place] [address]    Report Status PENDING  Incomplete  Culture, Blood     Status: None (Preliminary result)   Collection Time: 08/14/20  2:50 PM   Specimen: BLOOD  Result Value Ref Range Status   Specimen Description BLOOD PORTA CATH  Final   Special Requests   Final    BOTTLES DRAWN AEROBIC AND ANAEROBIC Blood Culture adequate volume   Culture   Final    NO GROWTH 2 DAYS Performed at [facility], [place] [address], [place], [place] [address]    Report Status PENDING  Incomplete  SARS Coronavirus 2 (TAT 6-24 hrs)     Status: None   Collection Time: 08/14/20  3:00 PM   Specimen: Nasopharyngeal Swab  Result Value Ref Range Status   SARS Coronavirus 2 NEGATIVE NEGATIVE Final    Comment: (NOTE) SARS-CoV-2 target nucleic acids are NOT DETECTED.  The SARS-CoV-2 RNA is generally detectable in upper and lower respiratory specimens during the acute phase of infection. Negative results do not preclude SARS-CoV-2 infection, do not rule out co-infections with other pathogens, and should not be used as the sole basis for treatment or other patient management decisions. Negative results must be combined with clinical observations, patient history, and epidemiological information. The expected result is Negative.  Fact Sheet for  Patients: SugarRoll.be  Fact Sheet for Healthcare Providers: https://www.woods-mathews.com/  This test is not yet approved or cleared by the [place] FDA and  has been authorized for detection and/or diagnosis of SARS-CoV-2 by FDA under an Emergency Use Authorization (EUA). This EUA will remain  in effect (meaning this test can be used) for the duration of the COVID-19 declaration under Se ction 564(b)(1) of the Act, 21 U.S.C. section 360bbb-3(b)(1), unless the authorization  is terminated or revoked sooner.  Performed at [facility], [place] [address] [place], [place] [address]       Studies:  No results found.  Assessment: 44 y.o.  Rebecca [name] status post right breast upper-outer quadrant biopsy 01/28/2016 for a clinical T2 N0 invasive ductal carcinoma, grade 3, triple negative, with an MIB-1 of 70%.             (a) suspicious right axillary lymph node biopsied 01/28/2016 was benign  (1) neoadjuvant chemotherapy: doxorubicin and cyclophosphamide in dose dense fashion 4 started 04/14/16, completed 05/26/2016, followed by paclitaxel and carboplatin weekly 12, Started 06/09/2016             (a) taxol discontinued after 7 doses because of neuropathy, last dose 07/21/2016  (2) genetics testing November 16, 2017through the 32-gene Comprehensive Cancer Panel offered by [facility] [place], [place]) (with MSH2Exons 1-7 Inversion Analysis) found no deleterious mutations or VUSS  In APC, ATM, AXIN2, BARD1, BMPR1A, BRCA1, BRCA2, BRIP1, CDH1, CDK4, CDKN2A, CHEK2, EPCAM, FANCC, MLH1, MSH2, MSH6, MUTYH, NBN, PALB2, PMS2, POLD1, POLE, PTEN, RAD51C, RAD51D, SCG5/GREM1, SMAD4, STK11, TP53, VHL, and XRCC2.   (3) right mastectomy and sentinel lymph node sampling 09/06/2016 showed a residual ypT1c ypN0, invasive ductal carcinoma, grade 3, with negative margins. Repeat prognostic panel again triple negative   (4) adjuvant radiation  with capecitabine/Xeloda sensitization 11/15/16 - 01/12/17 Site/dose:Right Chest Wall and axilla (4 field)treated to 45 Gy in 25 fractions, and then Boosted an additional 14.4 Gy in 8 fractions.  (5) tobacco abuse disorder: The patient quit smoking 04/04/2018  METASTATIC DISEASE:  July 2020: chest wall, bones, nodes (6) nonspecific changes noted on chest CT 06/11/2019 were clarified by PET scan 06/19/2019 showing hypermetabolic disease in the right anterior chest wall, right internal mammary nodes, right and left axillary nodes, but no metastatic disease in the neck, lungs, abdomen or pelvis.  Bone marrow uptake suggests bony metastatic disease.             (a) CARIS requested obtained from 06/28/2019 sample confirmed a triple negativity, the tumor also was negative for the androgen receptor, was MSI stable and mismatch repair status proficient, with a low mutational burden.  BRCA 1 and 2 were negative and PD-L1 was negative.  PI K3 showed a variant of uncertain significance.  However the tumor was genomic LOH high             (b) CA-27-29 is informative: was 118.3 on 06/04/2019  (7) zoledronate started 07/17/2019--on hold currently due to dental issues, and until 6 weeks at least after dental work  (8) carboplatin/ gemcitabine days 1 and 8 Q21 day cycle started 07/10/2019, last dose 10/30/2019             (a) restaging studies 09/2019 showed no progression             (b) restaging studies after 6 cycles showed mild disease progression  (9) cyclophosphamide, methotrexate, fluorouracil chemotherapy started 11/13/2019, repeated every 21 days.             (a) discontinued after cycle 3 (12/31/2019): No evidence of response  (10) started capecitabine 01/21/2020, given 1 week on and 1 week off at standard doses (1500 mg twice daily)              (a) Discontinued after almost three months of treatment (last on 04/06/2020)             (b) Disease progression on 4/27 CT scan  showing increasing  right chest wall disease, left breast lesions, and ? Liver involvement.             (c) MRI liver 04/14/2020 shows no liver invovlement  (11) Started liposomal doxorubicin/Doxil given on day 1 of a 21 day cycle on 04/07/2020             (a) echo on 04/02/2020 shows EF of 50-55%, repeat in 06/2020             (b) chest CT scan after 4 cycles of Doxil shows some evidence of progression in left axillary and right internal mammary adenopathy             (c) Doxil discontinued after 06/09/2020 dose  (12) started sacituzumab govitecan/ Trodelvy 07/10/2020  (a) day 8 cycle 2 omitted secondary to urosepsis    Plan:  Rebecca [name] is feeling better.  She is ambulatory, eating normally, with mild constipation controlled on her current bowel prophylaxis regimen.  She has no symptoms related to her anemia.  Pain is well controlled on current medications.  Sensitivities of her urinary E. coli are pending.  Likely this can be covered with oral antibiotics and patient can be discharged as soon as sensitivities are available  While I do not see obvious response on the skin--she has nodules on the left breast as well now--the tumor markers have been dropping since we started the Kamrar and we are going to continue with that agent at least 2 more cycles, with her next dose due in 10 days.  She has an appointment with me for labs and treatment on 08/27/2020 at 8:30 in the morning.  She is aware.  Please let me know if we can be of further help.     Chauncey Cruel, MD 08/17/2020  7:44 AM Medical Oncology and Hematology [facility] [address] [place], [place] [address] Tel. [phone]    Fax. [phone]

## 2020-08-18 ENCOUNTER — Telehealth: Payer: Self-pay | Admitting: *Deleted

## 2020-08-18 DIAGNOSIS — C50411 Malignant neoplasm of upper-outer quadrant of right female breast: Secondary | ICD-10-CM

## 2020-08-18 NOTE — Discharge Summary (Signed)
[facility] Discharge Summary   Patient: Rebecca [name] DPO:[id number]  PCP: Dorena Dew, FNP  Date of admission: 08/14/2020   Date of discharge: 08/17/2020      Discharge Diagnoses:  Principal diagnosis Sepsis due to UTI  Active Problems:   Malignant neoplasm of upper-outer quadrant of right breast in female, estrogen receptor negative ([place])   Neutropenic fever ([place])   Sepsis ([place])   Acute lower UTI   Open chest wound, right, initial encounter   Anemia associated with chemotherapy   Admitted From: home Disposition:  Home   Recommendations for Outpatient Follow-up:  1. PCP: please follow up with PCP in 1week 2. Follow up LABS/TEST:  none   Follow-up Information    Dorena Dew, FNP. Schedule an appointment as soon as possible for a visit in 1 week(s).   Specialty: Family Medicine Contact information: . [place] [address] [phone]              Diet recommendation: Cardiac diet  Activity: The patient is advised to gradually reintroduce usual activities, as tolerated  Discharge Condition: stable  Code Status: Full code   History of present illness: As per the H and P dictated on admission, "Rebecca [name] is a 44 y.o. female with medical history significant of metastatic breast cancer on chemotherapy, who presented to the cancer center today for scheduled chemotherapy.  Upon assessment, she was noted to be tachycardic and mildly febrile.  She received a dose of intravenous antibiotics (cefepime), blood cultures, basic labs were drawn and she was sent to the ER for further evaluation.  Patient denies any cough, fever, shortness of breath.  She is not had any vomiting.  She is chronically constipated.  She denies any hematuria.  She has a "funny feeling" when she passes her urine.  No abdominal pain or flank pain.  She has chronic pain in her right breast wound.  She does her own dressing changes and feels  that her wound is doing well.  Describes tissue is pink.  She has not noticed any new drainage or pus."  Hospital Course:  Summary of her active problems in the hospital is as following. 1.  Sepsis POA likely from UTI Febrile neutropenia Presents with tachycardia, neutropenia and fever meeting SIRS criteria. UA shows evidence of pyuria. Urine culture grew e coli  blood culture currently negative Patient received a dose of filgrastim at cancer center on 08/14/2020. Counts elevated appropriately. On ceftriaxone change to bactrim for now.  2.  Metastatic breast cancer with mets to bone Follows up with oncology outpatient. Currently on chemotherapy. Chemotherapy currently on hold due to presentation with neutropenic fever. Continue wound care of the breast cancer site  3.  Anemia of chemotherapy. H&H stable. Monitor.  4.  Hypokalemia Currently being replaced.  5.  Elevated INR Monitor for now.  6.  Obesity Body mass index is 37.46 kg/m.  At risk for protein calorie malnutrition given her present cancer Weight 227 pound in 2019, currently 191 pound on admission. Monitor.  7.  Low cortisol level. Cosyntropin stimulation test is negative. Continue to monitor for now.  8.  Constipation. MiraLAX.  Patient was ambulatory without any assistance. On the day of the discharge the patient's vitals were stable, and no other acute medical condition were reported by patient. the patient was felt safe to be discharge at Home with no therapy needed on discharge.  Consultants: none Procedures: none  Discharge Exam: General: Appear  in no distress, no Rash; Oral Mucosa Clear, moist. Cardiovascular: S1 and S2 Present, no Murmur, Respiratory: normal respiratory effort, Bilateral Air entry present and no Crackles, no wheezes Abdomen: Bowel Sound present, Soft and no tenderness, no hernia Extremities: no Pedal edema, no calf tenderness Neurology: alert and oriented to time, place,  and person affect appropriate.  Filed Weights   08/14/20 2047  Weight: 87 kg   Vitals:   08/17/20 0533 08/17/20 1250  BP: (!) 105/57 122/81  Pulse: 89 93  Resp: 16   Temp: 97.9 F (36.6 C) 98.6 F (37 C)  SpO2: 100% 100%    DISCHARGE MEDICATION: Allergies as of 08/17/2020   No Known Allergies     Medication List    TAKE these medications   calcium carbonate 500 MG chewable tablet Commonly known as: TUMS - dosed in mg elemental calcium Chew 1 tablet by mouth daily as needed for indigestion or heartburn.   lidocaine-prilocaine cream Commonly known as: EMLA Apply to affected area once What changed:   how much to take  how to take this  when to take this  reasons to take this   LORazepam 0.5 MG tablet Commonly known as: ATIVAN Take 1 tablet (0.5 mg total) by mouth daily as needed for anxiety.   oxyCODONE 5 MG immediate release tablet Commonly known as: Oxy IR/ROXICODONE Take 1 tablet (5 mg total) by mouth every 4 (four) hours as needed for severe pain.   polyethylene glycol 17 g packet Commonly known as: MIRALAX / GLYCOLAX Take 17 g by mouth daily.   sulfamethoxazole-trimethoprim 800-160 MG tablet Commonly known as: BACTRIM DS Take 1 tablet by mouth 2 (two) times daily for 3 days.      No Known Allergies Discharge Instructions    Diet - low sodium heart healthy   Complete by: As directed    Increase activity slowly   Complete by: As directed    No wound care   Complete by: As directed       The results of significant diagnostics from this hospitalization (including imaging, microbiology, ancillary and laboratory) are listed below for reference.    Significant Diagnostic Studies: CT Chest W Contrast  Result Date: 08/04/2020 CLINICAL DATA:  Breast cancer, assess treatment response EXAM: CT CHEST WITH CONTRAST TECHNIQUE: Multidetector CT imaging of the chest was performed during intravenous contrast administration. CONTRAST:  77mL OMNIPAQUE IOHEXOL  300 MG/ML  SOLN COMPARISON:  June 26, 2020 FINDINGS: Cardiovascular: LEFT-sided Port-A-Cath terminates at the caval to atrial junction. Heart size mildly enlarged without pericardial effusion. Aortic caliber and contour is normal. Central pulmonary vasculature is unremarkable. Mediastinum/Nodes: Internal mammary lymph nodes may be slightly smaller than on the prior study. RIGHT internal mammary lymph node approximately 6 mm previously approximately 12 mm. Another area on image 52 series 3, approximately 7 mm as compared to 7 mm. No thoracic inlet adenopathy. Bulky LEFT axillary lymphadenopathy just deep to the margin of the pectoralis musculature (image 47, series 3) approximately 1.8 cm short axis on the prior study, on the current study this measures approximately 1.5 cm. Along the margin of the pectoralis musculature on image 65 of series 3 is a 17-18 mm lymph node, previously approximately 17 mm. Bulky adenopathy at the margin of the images, approximately 2.2 cm within 1 mm of the prior study with respect to the imaged portion of this area. Ulcerated soft tissue along the RIGHT chest wall shows a similar appearance. Discrete lymph node in the RIGHT axilla also  with similar appearance. Lungs/Pleura: Airways are patent. No consolidation. No pleural effusion. Basilar atelectasis. Upper Abdomen: Mild heterogeneity of hepatic parenchyma suggests hepatic steatosis. No acute upper abdominal process to the extent evaluated with limited assessment on today's exam. Musculoskeletal: Ulcerated area along the RIGHT anterior chest wall with similar appearance. No acute musculoskeletal process. IMPRESSION: 1. Bulky LEFT axillary adenopathy with similar appearance. Perhaps 1 of these lymph nodes shows diminished size compared to the previous study as described. 2. Infiltration of the RIGHT pectoralis muscle also without interval change. Internal mammary lymph nodes may be slightly smaller than on the prior study. 3. Ulcerated  soft tissue along the RIGHT anterior chest wall with similar appearance. 4. Mild heterogeneity of hepatic parenchyma suggests hepatic steatosis. 5. Aortic atherosclerosis. Aortic Atherosclerosis (ICD10-I70.0). Electronically Signed   By: Zetta Bills M.D.   On: 08/04/2020 13:30   DG Chest Portable 1 View  Result Date: 08/14/2020 CLINICAL DATA:  Fever. EXAM: PORTABLE CHEST 1 VIEW COMPARISON:  Most recent chest CT 08/04/2020. FINDINGS: Accessed left chest port with tip in the SVC. Lung volumes are low. No focal airspace disease. Normal heart size with normal mediastinal contours. No pulmonary edema. No pleural effusion or pneumothorax. No acute osseous abnormalities are seen. IMPRESSION: Low lung volumes without acute abnormality. Electronically Signed   By: Keith Rake M.D.   On: 08/14/2020 18:30    Microbiology: Recent Results (from the past 240 hour(s))  Urine Culture     Status: Abnormal   Collection Time: 08/14/20  2:34 PM   Specimen: Urine, Clean Catch  Result Value Ref Range Status   Specimen Description   Final    URINE, CLEAN CATCH Performed at [facility], [place] [address], [place], [place] [address]    Special Requests   Final    NONE Performed at [facility], [place] [address], [place], [place] [address]    Culture >=100,000 COLONIES/mL ESCHERICHIA COLI (A)  Final   Report Status 08/17/2020 FINAL  Final   Organism ID, Bacteria ESCHERICHIA COLI (A)  Final      Susceptibility   Escherichia coli - MIC*    AMPICILLIN >=32 RESISTANT Resistant     CEFAZOLIN 16 SENSITIVE Sensitive     CEFTRIAXONE <=0.25 SENSITIVE Sensitive     CIPROFLOXACIN <=0.25 SENSITIVE Sensitive     GENTAMICIN <=1 SENSITIVE Sensitive     IMIPENEM <=0.25 SENSITIVE Sensitive     NITROFURANTOIN <=16 SENSITIVE Sensitive     TRIMETH/SULFA <=20 SENSITIVE Sensitive     AMPICILLIN/SULBACTAM >=32 RESISTANT Resistant     PIP/TAZO <=4 SENSITIVE Sensitive     *  >=100,000 COLONIES/mL ESCHERICHIA COLI  Culture, Blood     Status: None (Preliminary result)   Collection Time: 08/14/20  2:40 PM   Specimen: BLOOD  Result Value Ref Range Status   Specimen Description BLOOD LEFT ANTECUBITAL  Final   Special Requests   Final    BOTTLES DRAWN AEROBIC AND ANAEROBIC Blood Culture results may not be optimal due to an excessive volume of blood received in culture bottles   Culture   Final    NO GROWTH 4 DAYS Performed at [facility] [address] [place], [place] [address]    Report Status PENDING  Incomplete  Culture, Blood     Status: None (Preliminary result)   Collection Time: 08/14/20  2:50 PM   Specimen: BLOOD  Result Value Ref Range Status   Specimen Description BLOOD PORTA CATH  Final  Special Requests   Final    BOTTLES DRAWN AEROBIC AND ANAEROBIC Blood Culture adequate volume   Culture   Final    NO GROWTH 4 DAYS Performed at [facility], [place] [address], [place], [place] [address]    Report Status PENDING  Incomplete  SARS Coronavirus 2 (TAT 6-24 hrs)     Status: None   Collection Time: 08/14/20  3:00 PM   Specimen: Nasopharyngeal Swab  Result Value Ref Range Status   SARS Coronavirus 2 NEGATIVE NEGATIVE Final    Comment: (NOTE) SARS-CoV-2 target nucleic acids are NOT DETECTED.  The SARS-CoV-2 RNA is generally detectable in upper and lower respiratory specimens during the acute phase of infection. Negative results do not preclude SARS-CoV-2 infection, do not rule out co-infections with other pathogens, and should not be used as the sole basis for treatment or other patient management decisions. Negative results must be combined with clinical observations, patient history, and epidemiological information. The expected result is Negative.  Fact Sheet for Patients: SugarRoll.be  Fact Sheet for Healthcare Providers: https://www.woods-mathews.com/  This test is not yet approved  or cleared by the [place] FDA and  has been authorized for detection and/or diagnosis of SARS-CoV-2 by FDA under an Emergency Use Authorization (EUA). This EUA will remain  in effect (meaning this test can be used) for the duration of the COVID-19 declaration under Se ction 564(b)(1) of the Act, 21 U.S.C. section 360bbb-3(b)(1), unless the authorization is terminated or revoked sooner.  Performed at [facility], [place] [address], [place], [place] [address]    Labs: CBC: Recent Labs  Lab 08/14/20 1243 08/14/20 2106 08/15/20 0400 08/16/20 0426  WBC 3.2* 4.8 5.8 13.4*  NEUTROABS 0.8*  --  2.9 9.5*  HGB 8.8* 8.0* 7.7* 7.7*  HCT 26.5* 24.0* 23.6* 23.8*  MCV 88.3 90.6 91.5 91.5  PLT 222 192 211 970   Basic Metabolic Panel: Recent Labs  Lab 08/14/20 1243 08/14/20 2106 08/15/20 0400 08/16/20 0426  NA 137  --  141 140  K 3.5  --  3.2* 3.4*  CL 100  --  101 101  CO2 28  --  28 27  GLUCOSE 93  --  120* 92  BUN 6  --  8 7  CREATININE 0.72 0.66 0.68 0.58  CALCIUM 9.2  --  8.6* 8.8*  MG  --   --   --  1.1*   Liver Function Tests: Recent Labs  Lab 08/14/20 1243 08/15/20 0400 08/16/20 0426  AST 14* 14* 14*  ALT 11 13 14   ALKPHOS 69 53 61  BILITOT 0.4 0.5 0.3  PROT 8.1 6.6 7.4  ALBUMIN 3.6 3.3* 3.5   Time spent: 35 minutes  Signed:  Berle Mull  [facility] 08/17/2020 10:07 PM

## 2020-08-18 NOTE — Telephone Encounter (Signed)
Patient admitted to [facility] 08/14/20-08/17/20. Order placed for [place] for completion of Transition of Care Assessment due to patient diagnosis.  Lenor Coffin, RN, BSN, [place] [facility] [place] [phone]

## 2020-08-19 LAB — CULTURE, BLOOD (SINGLE)
Culture: NO GROWTH
Culture: NO GROWTH
Special Requests: ADEQUATE

## 2020-08-26 NOTE — Progress Notes (Signed)
[place]  [phone] [phone]) [phone]   ID: [name] DOB: 1976/11/17  MR#: [id number]  CBS#:[id number]  Patient Care Team: Dorena Dew, FNP as PCP - General (Family Medicine) Alphonsa Overall, MD as Consulting Physician (General Surgery) Artavis Cowie, [name], MD as Consulting Physician (Oncology) Gery Pray, MD as Consulting Physician (Radiation Oncology) Delice Bison, Charlestine Massed, NP as Nurse Practitioner (Hematology and Oncology) Alda Berthold, DO as Consulting Physician (Neurology) OTHER MD:  CHIEF COMPLAINT: triple negative stage IV breast cancer  CURRENT TREATMENT: sacituzumab/Trodelvy    INTERVAL HISTORY: Rebecca [name] returns today for follow up and treatment of her triple negative metastatic breast cancer  She continues on Trodelvy and generally tolerates it well however she was found to be febrile and neutropenic although asymptomatic when she presented for treatment our clinic on 08/14/2020.  She was referred to the ED, admitted, and cultures have grown E. coli .  She was treated with ceftriaxone initially and discharged on Bactrim 08/17/2020.  She underwent restaging chest CT on 08/04/2020 showing: similar bulky left axillary adenopathy, with perhaps diminished size of one lymph node; stable infiltration of right pectoralis muscle, internal mammary lymph node may be slightly smaller; similar ulcerated soft tissue along right anterior chest wall.  REVIEW OF SYSTEMS: Rebecca [name] tells me things are fairly stable.  One of her children had a very difficult sickle cell crisis.  She had to drive him to [facility].  Rebecca [name]'s mother does come by and help as frequently as she can.  Patient has taking oxycodone for pain up to 3 times a day as needed.  She also uses lorazepam in the evening.  All this is helping her cope with a very difficult situation.  She is managing the constipation by taking MiraLAX daily.    BREAST CANCER HISTORY: From the original intake  note:  Ranesha herself noted a change in her right breast sometime around September or October 2016. She did not bring it to intermediate medical attention, but on 01/13/2016 she established herself in Dr. Smith Robert' service and she was set up for bilateral diagnostic mammography with tomosynthesis and bilateral ultrasonography at the [facility] 01/19/2016. The breast density was category B. In the upper outer quadrant of the right breast there was a spiculated mass measuring 2.8 cm. On physical exam this was palpable. Targeted ultrasonography confirmed an irregular hypoechoic mass in the right breast 11:30 o'clock position measuring 2.6 cm maximally. Ultrasound of the right axilla showed a morphologically abnormal lymph node.  In the left breast there were some tubular densities behind the areola which by ultrasonography showed benign ductal ectasia.  On 01/28/2016 Mekiyah underwent biopsy of the right breast mass and abnormal right axillary lymph node. The pathology from this procedure (S [id number]) showed the lymph node to be benign. In the breast however there was an invasive ductal carcinoma, grade 3, which was estrogen and progesterone receptor negative. The proliferation marker was 70%. HER-2 was not amplified with a signals ratio of 1.32. The number per cell was 2.05.  The patient's subsequent history is as detailed below   PAST MEDICAL HISTORY: Past Medical History:  Diagnosis Date  . Anxiety   . Breast cancer ([place])   . Depression   . GERD (gastroesophageal reflux disease)   . History of radiation therapy 11/15/16-01/12/17   right chest wall and axilla treated to 45 Gy in 25 fractions, boosted and additional 14 Gy in 8 fractions  . Hypertension    diet controlled  .  Obesity (BMI 35.0-39.9 without comorbidity)   . Pneumonia    as a child  . Seasonal allergies   . Sickle cell trait ([place])   . Termination of pregnancy (fetus) 04/02/16    PAST SURGICAL HISTORY: Past Surgical History:    Procedure Laterality Date  . CESAREAN SECTION     2004 and 2007  . MASTECTOMY W/ SENTINEL NODE BIOPSY Right 09/06/2016   Procedure: RIGHT BREAST MASTECTOMY WITH RIGHT AXILLARY SENTINEL LYMPH NODE BIOPSY;  Surgeon: Alphonsa Overall, MD;  Location: [facility];  Service: General;  Laterality: Right;  . PORT-A-CATH REMOVAL Left 09/06/2016   Procedure: REMOVAL PORT-A-CATH;  Surgeon: Alphonsa Overall, MD;  Location: [facility];  Service: General;  Laterality: Left;  . PORTACATH PLACEMENT    . PORTACATH PLACEMENT N/A 07/09/2019   Procedure: INSERTION PORT-A-CATH WITH ULTRASOUND;  Surgeon: Alphonsa Overall, MD;  Location: [facility] OR;  Service: General;  Laterality: N/A;    FAMILY HISTORY Family History  Problem Relation Age of Onset  . Hypertension Mother   . Cancer Mother        dx "intestinal cancer" in her 61s; +surgery  . Other Mother        hysterectomy at young age for unspecified cause  . Heart Problems Mother   . Breast cancer Cousin        maternal 1st cousin dx female breast cancer at 38-46y  . Cancer Father   . Hypertension Father   . Heart Problems Maternal Aunt   . Diabetes Maternal Aunt   . Breast cancer Maternal Uncle        dx 64-65  . Heart Problems Maternal Uncle   . Breast cancer Maternal Grandmother 61  . Throat cancer Maternal Grandfather        d. 32s; smoker  . Sickle cell anemia Paternal Aunt   . Congestive Heart Failure Maternal Aunt   . Multiple sclerosis Cousin   . Cancer Other        maternal great uncle (MGM's brother); cancer removed from his side  . Heart attack Paternal Aunt        d. early 30s  The patient has very little information about her father. Her mother is currently 26 years old. She had a history of cervical cancer at age 36. The patient had 2 brothers, no sisters. The maternal grandfather had throat cancer. A maternal uncle was diagnosed with breast cancer as well as prostate cancer at the age of 19. 2 maternal cousins, one of them  female, had breast cancer as well.   GYNECOLOGIC HISTORY:  No LMP recorded. Menarche age 1, first live birth age 1. The patient is GX P4. She was still having regular periods at the time of diagnosis. She took oral contraceptives in the 1990s with no side effects.--.  Stopped with chemotherapy and have not resumed as of May 2019   SOCIAL HISTORY:  (Updated 10/2018) She works as a Market researcher. The patient's significant other Dwayne [name] works at [employer].  At home with the patient are her 3 children Rebecca [name], Rebecca [name] and Rebecca [name]. There are age 69, 10, and 82 as of November 2019.  2 of them are disabled or have significant health problems, one with sickle cell disease, the other with autism and developmental delay.  The patient's son Rebecca [name], currently 40 years old, lives in [place].    ADVANCED DIRECTIVES: Not in place   HEALTH MAINTENANCE: Social History  Tobacco Use  . Smoking status: Former Smoker    Packs/day: 1.00    Years: 20.00    Pack years: 20.00    Types: Cigarettes    Quit date: 04/01/2018    Years since quitting: 2.4  . Smokeless tobacco: Never Used  . Tobacco comment: Patient has quit smoking x 1 year now  Vaping Use  . Vaping Use: Never used  Substance Use Topics  . Alcohol use: Yes    Comment: occ  . Drug use: No    Colonoscopy:  PAP:  Bone density:  Lipid panel:  No Known Allergies  Current Outpatient Medications on File Prior to Visit  Medication Sig Dispense Refill  . calcium carbonate (TUMS - DOSED IN MG ELEMENTAL CALCIUM) 500 MG chewable tablet Chew 1 tablet by mouth daily as needed for indigestion or heartburn.    . lidocaine-prilocaine (EMLA) cream Apply to affected area once (Patient taking differently: Apply 1 application topically daily as needed (access port). Apply to affected area once) 30 g 3  . LORazepam (ATIVAN) 0.5 MG tablet Take 1 tablet (0.5 mg total) by mouth daily as needed for anxiety.  30 tablet 0  . oxyCODONE (OXY IR/ROXICODONE) 5 MG immediate release tablet Take 1 tablet (5 mg total) by mouth every 4 (four) hours as needed for severe pain. 30 tablet 0  . polyethylene glycol (MIRALAX / GLYCOLAX) 17 g packet Take 17 g by mouth daily. 14 each 0   No current facility-administered medications on file prior to visit.    OBJECTIVE: African-American woman who appears stated age  26:   08/27/20 0907  BP: 113/84  Pulse: (!) 119  Resp: 18  Temp: 98.1 F (36.7 C)  SpO2: 100%   Wt Readings from Last 3 Encounters:  08/27/20 192 lb 11.2 oz (87.4 kg)  08/14/20 191 lb 12.8 oz (87 kg)  08/07/20 189 lb 8 oz (86 kg)   Body mass index is 37.63 kg/m.    ECOG FS: 1  Sclerae unicteric, EOMs intact Wearing a mask No cervical or supraclavicular adenopathy Lungs no rales or rhonchi Heart regular rate and rhythm Abd soft, nontender, positive bowel sounds MSK no focal spinal tenderness, no upper extremity lymphedema Neuro: nonfocal, well oriented, appropriate affect Breasts: The right breast is status post mastectomy.  The area of chest wall involvement is bandaged over and the bandages clean and dry.  She tells me that it is looking pink but it has not yet begun to scab over.   Right breast 05/06/2020    Right chest wall area 11/22/2019, which is the new baseline   LAB RESULTS: CMP Latest Ref Rng & Units 08/16/2020 08/15/2020 08/14/2020  Glucose 70 - 99 mg/dL 92 120(H) -  BUN 6 - 20 mg/dL 7 8 -  Creatinine 0.44 - 1.00 mg/dL 0.58 0.68 0.66  Sodium 135 - 145 mmol/L 140 141 -  Potassium 3.5 - 5.1 mmol/L 3.4(L) 3.2(L) -  Chloride 98 - 111 mmol/L 101 101 -  CO2 22 - 32 mmol/L 27 28 -  Calcium 8.9 - 10.3 mg/dL 8.8(L) 8.6(L) -  Total Protein 6.5 - 8.1 g/dL 7.4 6.6 -  Total Bilirubin 0.3 - 1.2 mg/dL 0.3 0.5 -  Alkaline Phos 38 - 126 U/L 61 53 -  AST 15 - 41 U/L 14(L) 14(L) -  ALT 0 - 44 U/L 14 13 -    CBC    Component Value Date/Time   WBC 12.5 (H) 08/27/2020 0853    RBC  2.93 (L) 08/27/2020 0853   RBC 2.93 (L) 08/27/2020 0853   HGB 8.7 (L) 08/27/2020 0853   HGB 9.4 (L) 08/07/2020 1129   HGB 10.4 (L) 10/04/2016 1154   HCT 26.1 (L) 08/27/2020 0853   HCT 31.3 (L) 10/04/2016 1154   PLT 254 08/27/2020 0853   PLT 309 08/07/2020 1129   PLT 320 10/04/2016 1154   MCV 89.1 08/27/2020 0853   MCV 95.7 10/04/2016 1154   MCH 29.7 08/27/2020 0853   MCHC 33.3 08/27/2020 0853   RDW 17.9 (H) 08/27/2020 0853   RDW 16.3 (H) 10/04/2016 1154   LYMPHSABS 1.3 08/27/2020 0853   LYMPHSABS 1.7 10/04/2016 1154   MONOABS 1.2 (H) 08/27/2020 0853   MONOABS 0.5 10/04/2016 1154   EOSABS 0.1 08/27/2020 0853   EOSABS 0.3 10/04/2016 1154   BASOSABS 0.1 08/27/2020 0853   BASOSABS 0.0 10/04/2016 1154    STUDIES: CT Chest W Contrast  Result Date: 08/04/2020 CLINICAL DATA:  Breast cancer, assess treatment response EXAM: CT CHEST WITH CONTRAST TECHNIQUE: Multidetector CT imaging of the chest was performed during intravenous contrast administration. CONTRAST:  70m OMNIPAQUE IOHEXOL 300 MG/ML  SOLN COMPARISON:  June 26, 2020 FINDINGS: Cardiovascular: LEFT-sided Port-A-Cath terminates at the caval to atrial junction. Heart size mildly enlarged without pericardial effusion. Aortic caliber and contour is normal. Central pulmonary vasculature is unremarkable. Mediastinum/Nodes: Internal mammary lymph nodes may be slightly smaller than on the prior study. RIGHT internal mammary lymph node approximately 6 mm previously approximately 12 mm. Another area on image 52 series 3, approximately 7 mm as compared to 7 mm. No thoracic inlet adenopathy. Bulky LEFT axillary lymphadenopathy just deep to the margin of the pectoralis musculature (image 47, series 3) approximately 1.8 cm short axis on the prior study, on the current study this measures approximately 1.5 cm. Along the margin of the pectoralis musculature on image 65 of series 3 is a 17-18 mm lymph node, previously approximately 17 mm. Bulky  adenopathy at the margin of the images, approximately 2.2 cm within 1 mm of the prior study with respect to the imaged portion of this area. Ulcerated soft tissue along the RIGHT chest wall shows a similar appearance. Discrete lymph node in the RIGHT axilla also with similar appearance. Lungs/Pleura: Airways are patent. No consolidation. No pleural effusion. Basilar atelectasis. Upper Abdomen: Mild heterogeneity of hepatic parenchyma suggests hepatic steatosis. No acute upper abdominal process to the extent evaluated with limited assessment on today's exam. Musculoskeletal: Ulcerated area along the RIGHT anterior chest wall with similar appearance. No acute musculoskeletal process. IMPRESSION: 1. Bulky LEFT axillary adenopathy with similar appearance. Perhaps 1 of these lymph nodes shows diminished size compared to the previous study as described. 2. Infiltration of the RIGHT pectoralis muscle also without interval change. Internal mammary lymph nodes may be slightly smaller than on the prior study. 3. Ulcerated soft tissue along the RIGHT anterior chest wall with similar appearance. 4. Mild heterogeneity of hepatic parenchyma suggests hepatic steatosis. 5. Aortic atherosclerosis. Aortic Atherosclerosis (ICD10-I70.0). Electronically Signed   By: GZetta BillsM.D.   On: 08/04/2020 13:30   DG Chest Portable 1 View  Result Date: 08/14/2020 CLINICAL DATA:  Fever. EXAM: PORTABLE CHEST 1 VIEW COMPARISON:  Most recent chest CT 08/04/2020. FINDINGS: Accessed left chest port with tip in the SVC. Lung volumes are low. No focal airspace disease. Normal heart size with normal mediastinal contours. No pulmonary edema. No pleural effusion or pneumothorax. No acute osseous abnormalities are seen. IMPRESSION: Low lung volumes without  acute abnormality. Electronically Signed   By: Keith Rake M.D.   On: 08/14/2020 18:30     RESEARCH: Referred to PREVENT study, but was pregnant at the time; referred to [facility]  11202, but the biopsied lymph node was benign; referred to weight loss study but declined; referred to [facility] 12/09/2000, but the timing of radiation and 8 was greater than 60 days past the date of diagnosis; referred to health disparity study, but declined; referred to [id number] adjuvant therapy study, but the patient received Xeloda with radiation and therefore was ineligible   ASSESSMENT: 44 y.o. Alpha woman status post right breast upper-outer quadrant biopsy 01/28/2016 for a clinical T2 N0 invasive ductal carcinoma, grade 3, triple negative, with an MIB-1 of 70%.  (a) suspicious right axillary lymph node biopsied 01/28/2016 was benign  (1) neoadjuvant chemotherapy: doxorubicin and cyclophosphamide in dose dense fashion 4 started 04/14/16, completed 05/26/2016, followed by paclitaxel and carboplatin weekly 12, Started 06/09/2016  (a) taxol discontinued after 7 doses because of neuropathy, last dose 07/21/2016  (2) genetics testing October 20, 2016 through the 32-gene Comprehensive Cancer Panel offered by [facility] Junius Roads, MD) (with MSH2 Exons 1-7 Inversion Analysis) found no deleterious mutations or VUSS  In APC, ATM, AXIN2, BARD1, BMPR1A, BRCA1, BRCA2, BRIP1, CDH1, CDK4, CDKN2A, CHEK2, EPCAM, FANCC, MLH1, MSH2, MSH6, MUTYH, NBN, PALB2, PMS2, POLD1, POLE, PTEN, RAD51C, RAD51D, SCG5/GREM1, SMAD4, STK11, TP53, VHL, and XRCC2.    (3) right mastectomy and sentinel lymph node sampling 09/06/2016 showed a residual ypT1c ypN0, invasive ductal carcinoma, grade 3, with negative margins. Repeat prognostic panel again triple negative   (4) adjuvant radiation with capecitabine/Xeloda sensitization 11/15/16 - 01/12/17 Site/dose:   Right Chest Wall and axilla (4 field) treated to 45 Gy in 25 fractions, and then Boosted an additional 14.4 Gy in 8 fractions.  (5) tobacco abuse disorder: The patient quit smoking 04/04/2018  METASTATIC DISEASE:  July 2020: chest wall, bones, nodes (6)  nonspecific changes noted on chest CT 06/11/2019 were clarified by PET scan 06/19/2019 showing hypermetabolic disease in the right anterior chest wall, right internal mammary nodes, right and left axillary nodes, but no metastatic disease in the neck, lungs, abdomen or pelvis.  Bone marrow uptake suggests bony metastatic disease.  (a) CARIS requested obtained from 06/28/2019 sample confirmed a triple negativity, the tumor also was negative for the androgen receptor, was MSI stable and mismatch repair status proficient, with a low mutational burden.  BRCA 1 and 2 were negative and PD-L1 was negative.  PI K3 showed a variant of uncertain significance.  However the tumor was genomic LOH high  (b) CA-27-29 is informative: was 118.3 on 06/04/2019  (7) zoledronate started 07/17/2019--on hold currently due to dental issues, and until 6 weeks at least after dental work  (8) carboplatin/ gemcitabine days 1 and 8 Q21 day cycle started 07/10/2019, last dose 10/30/2019  (a) restaging studies 09/2019 showed no progression  (b) restaging studies after 6 cycles showed mild disease progression  (9) cyclophosphamide, methotrexate, fluorouracil chemotherapy started 11/13/2019, repeated every 21 days.  (a) discontinued after cycle 3 (12/31/2019): No evidence of response  (10) started capecitabine 01/21/2020, given 1 week on and 1 week off at standard doses (1500 mg twice daily)   (a) Discontinued after almost three months of treatment (last on 04/06/2020)  (b) Disease progression on 4/27 CT scan showing increasing right chest wall disease, left breast lesions, and ? Liver involvement.  (c) MRI liver 04/14/2020 shows no liver invovlement  (11) Started  liposomal doxorubicin/Doxil given on day 1 of a 21 day cycle on 04/07/2020  (a) echo on 04/02/2020 shows EF of 50-55%, repeat in 06/2020  (b) chest CT scan after 4 cycles of Doxil shows some evidence of progression in left axillary and right internal mammary adenopathy  (c)  Doxil discontinued after 06/09/2020 dose  (12) started sacituzumab govitecan/ Trodelvy 07/10/2020  (a) day 8 cycle 2 omitted due to febrile neutropenia requiring admission  (b) changed to every two week dosing    PLAN: Chaniyah  appears to be responding to the [place] per the recent scan.  I gave her a copy.  However she was neutropenic on day 8 of the last cycle.  1 way to move on from this point would be to change her treatment to every 2weeks.  She will receive Udenyca on day 3.  I think that would avoid the neutropenia and hopefully continue to work for her.  We are going to see her on each treatment day.  She will have a restaging CT scan in December.  I am really filling her oxycodone and lorazepam today.  She is continuing on bowel prophylaxis with MiraLAX daily and stool softeners as needed  Total encounter time 25 minutes.  [name]. Haneef Hallquist, MD 08/27/20 9:35 AM Medical Oncology and Hematology [facility] [place], [place] [address] Tel. [phone]    Fax. [phone]   I, Wilburn Mylar, am acting as scribe for Dr. [name]. Gae Bihl.  I, Lurline Del MD, have reviewed the above documentation for accuracy and completeness, and I agree with the above.   *Total Encounter Time as defined by the Centers for Medicare and Medicaid Services includes, in addition to the face-to-face time of a patient visit (documented in the note above) non-face-to-face time: obtaining and reviewing outside history, ordering and reviewing medications, tests or procedures, care coordination (communications with other health care professionals or caregivers) and documentation in the medical record.

## 2020-08-27 ENCOUNTER — Inpatient Hospital Stay: Payer: Medicaid Other

## 2020-08-27 ENCOUNTER — Inpatient Hospital Stay (HOSPITAL_BASED_OUTPATIENT_CLINIC_OR_DEPARTMENT_OTHER): Payer: Medicaid Other | Admitting: Oncology

## 2020-08-27 ENCOUNTER — Telehealth: Payer: Self-pay | Admitting: *Deleted

## 2020-08-27 ENCOUNTER — Other Ambulatory Visit: Payer: Self-pay

## 2020-08-27 VITALS — HR 109

## 2020-08-27 VITALS — BP 113/84 | HR 119 | Temp 98.1°F | Resp 18 | Ht 60.0 in | Wt 192.7 lb

## 2020-08-27 DIAGNOSIS — C50411 Malignant neoplasm of upper-outer quadrant of right female breast: Secondary | ICD-10-CM

## 2020-08-27 DIAGNOSIS — C7951 Secondary malignant neoplasm of bone: Secondary | ICD-10-CM

## 2020-08-27 DIAGNOSIS — C50919 Malignant neoplasm of unspecified site of unspecified female breast: Secondary | ICD-10-CM

## 2020-08-27 DIAGNOSIS — Z803 Family history of malignant neoplasm of breast: Secondary | ICD-10-CM | POA: Diagnosis not present

## 2020-08-27 DIAGNOSIS — T451X5A Adverse effect of antineoplastic and immunosuppressive drugs, initial encounter: Secondary | ICD-10-CM

## 2020-08-27 DIAGNOSIS — Z7189 Other specified counseling: Secondary | ICD-10-CM | POA: Diagnosis not present

## 2020-08-27 DIAGNOSIS — M47816 Spondylosis without myelopathy or radiculopathy, lumbar region: Secondary | ICD-10-CM | POA: Diagnosis not present

## 2020-08-27 DIAGNOSIS — I10 Essential (primary) hypertension: Secondary | ICD-10-CM | POA: Diagnosis not present

## 2020-08-27 DIAGNOSIS — Z923 Personal history of irradiation: Secondary | ICD-10-CM | POA: Diagnosis not present

## 2020-08-27 DIAGNOSIS — Z801 Family history of malignant neoplasm of trachea, bronchus and lung: Secondary | ICD-10-CM | POA: Diagnosis not present

## 2020-08-27 DIAGNOSIS — Z171 Estrogen receptor negative status [ER-]: Secondary | ICD-10-CM | POA: Diagnosis not present

## 2020-08-27 DIAGNOSIS — G620 Drug-induced polyneuropathy: Secondary | ICD-10-CM

## 2020-08-27 DIAGNOSIS — C778 Secondary and unspecified malignant neoplasm of lymph nodes of multiple regions: Secondary | ICD-10-CM | POA: Diagnosis not present

## 2020-08-27 DIAGNOSIS — I700 Atherosclerosis of aorta: Secondary | ICD-10-CM | POA: Diagnosis not present

## 2020-08-27 DIAGNOSIS — Z87891 Personal history of nicotine dependence: Secondary | ICD-10-CM | POA: Diagnosis not present

## 2020-08-27 DIAGNOSIS — D573 Sickle-cell trait: Secondary | ICD-10-CM | POA: Diagnosis not present

## 2020-08-27 DIAGNOSIS — E669 Obesity, unspecified: Secondary | ICD-10-CM | POA: Diagnosis not present

## 2020-08-27 DIAGNOSIS — Z6841 Body Mass Index (BMI) 40.0 and over, adult: Secondary | ICD-10-CM

## 2020-08-27 DIAGNOSIS — F418 Other specified anxiety disorders: Secondary | ICD-10-CM | POA: Diagnosis not present

## 2020-08-27 DIAGNOSIS — C782 Secondary malignant neoplasm of pleura: Secondary | ICD-10-CM | POA: Diagnosis not present

## 2020-08-27 DIAGNOSIS — D709 Neutropenia, unspecified: Secondary | ICD-10-CM | POA: Diagnosis not present

## 2020-08-27 DIAGNOSIS — K219 Gastro-esophageal reflux disease without esophagitis: Secondary | ICD-10-CM | POA: Diagnosis not present

## 2020-08-27 DIAGNOSIS — Z5112 Encounter for antineoplastic immunotherapy: Secondary | ICD-10-CM | POA: Diagnosis present

## 2020-08-27 DIAGNOSIS — Z809 Family history of malignant neoplasm, unspecified: Secondary | ICD-10-CM | POA: Diagnosis not present

## 2020-08-27 LAB — COMPREHENSIVE METABOLIC PANEL
ALT: 10 U/L (ref 0–44)
AST: 12 U/L — ABNORMAL LOW (ref 15–41)
Albumin: 3.7 g/dL (ref 3.5–5.0)
Alkaline Phosphatase: 69 U/L (ref 38–126)
Anion gap: 7 (ref 5–15)
BUN: 8 mg/dL (ref 6–20)
CO2: 30 mmol/L (ref 22–32)
Calcium: 9.7 mg/dL (ref 8.9–10.3)
Chloride: 103 mmol/L (ref 98–111)
Creatinine, Ser: 0.76 mg/dL (ref 0.44–1.00)
GFR calc Af Amer: >60 mL/min (ref >60)
GFR calc non Af Amer: >60 mL/min (ref >60)
Glucose, Bld: 96 mg/dL (ref 70–99)
Potassium: 3.8 mmol/L (ref 3.5–5.1)
Sodium: 140 mmol/L (ref 135–145)
Total Bilirubin: 0.4 mg/dL (ref 0.3–1.2)
Total Protein: 7.9 g/dL (ref 6.5–8.1)

## 2020-08-27 LAB — CBC WITH DIFFERENTIAL/PLATELET
Abs Immature Granulocytes: 0.06 10*3/uL (ref 0.00–0.07)
Basophils Absolute: 0.1 10*3/uL (ref 0.0–0.1)
Basophils Relative: 1 %
Eosinophils Absolute: 0.1 10*3/uL (ref 0.0–0.5)
Eosinophils Relative: 1 %
HCT: 26.1 % — ABNORMAL LOW (ref 36.0–46.0)
Hemoglobin: 8.7 g/dL — ABNORMAL LOW (ref 12.0–15.0)
Immature Granulocytes: 1 %
Lymphocytes Relative: 10 %
Lymphs Abs: 1.3 10*3/uL (ref 0.7–4.0)
MCH: 29.7 pg (ref 26.0–34.0)
MCHC: 33.3 g/dL (ref 30.0–36.0)
MCV: 89.1 fL (ref 80.0–100.0)
Monocytes Absolute: 1.2 10*3/uL — ABNORMAL HIGH (ref 0.1–1.0)
Monocytes Relative: 9 %
Neutro Abs: 9.8 10*3/uL — ABNORMAL HIGH (ref 1.7–7.7)
Neutrophils Relative %: 78 %
Platelets: 254 10*3/uL (ref 150–400)
RBC: 2.93 MIL/uL — ABNORMAL LOW (ref 3.87–5.11)
RDW: 17.9 % — ABNORMAL HIGH (ref 11.5–15.5)
WBC: 12.5 10*3/uL — ABNORMAL HIGH (ref 4.0–10.5)
nRBC: 0 % (ref 0.0–0.2)

## 2020-08-27 LAB — SAMPLE TO BLOOD BANK

## 2020-08-27 LAB — RETICULOCYTES
Immature Retic Fract: 27.3 % — ABNORMAL HIGH (ref 2.3–15.9)
RBC.: 2.93 MIL/uL — ABNORMAL LOW (ref 3.87–5.11)
Retic Count, Absolute: 45.1 10*3/uL (ref 19.0–186.0)
Retic Ct Pct: 1.5 % (ref 0.4–3.1)

## 2020-08-27 LAB — FERRITIN: Ferritin: 739 ng/mL — ABNORMAL HIGH (ref 11–307)

## 2020-08-27 MED ORDER — OXYCODONE HCL 5 MG PO TABS: 5.0000 mg | ORAL_TABLET | Freq: Three times a day (TID) | ORAL | 0 refills | Status: DC | PRN

## 2020-08-27 MED ORDER — SODIUM CHLORIDE 0.9 % IV SOLN
150.0000 mg | Freq: Once | INTRAVENOUS | Status: AC
  Administered 2020-08-27: 150 mg via INTRAVENOUS
  Filled 2020-08-27: qty 150

## 2020-08-27 MED ORDER — ACETAMINOPHEN 325 MG PO TABS
650.0000 mg | ORAL_TABLET | Freq: Once | ORAL | Status: AC
  Administered 2020-08-27: 650 mg via ORAL

## 2020-08-27 MED ORDER — PALONOSETRON HCL INJECTION 0.25 MG/5ML
0.2500 mg | Freq: Once | INTRAVENOUS | Status: AC
  Administered 2020-08-27: 0.25 mg via INTRAVENOUS

## 2020-08-27 MED ORDER — ATROPINE SULFATE 1 MG/ML IJ SOLN: 0.5000 mg | Freq: Once | INTRAMUSCULAR | Status: DC | PRN

## 2020-08-27 MED ORDER — SODIUM CHLORIDE 0.9 % IV SOLN
10.0000 mg/kg | Freq: Once | INTRAVENOUS | Status: AC
  Administered 2020-08-27: 900 mg via INTRAVENOUS
  Filled 2020-08-27: qty 90

## 2020-08-27 MED ORDER — SODIUM CHLORIDE 0.9 % IV SOLN
Freq: Once | INTRAVENOUS | Status: AC
  Filled 2020-08-27: qty 250

## 2020-08-27 MED ORDER — SODIUM CHLORIDE 0.9% FLUSH
10.0000 mL | INTRAVENOUS | Status: DC | PRN
  Administered 2020-08-27: 10 mL
  Filled 2020-08-27: qty 10

## 2020-08-27 MED ORDER — DIPHENHYDRAMINE HCL 50 MG/ML IJ SOLN
50.0000 mg | Freq: Once | INTRAMUSCULAR | Status: AC
  Administered 2020-08-27: 50 mg via INTRAVENOUS

## 2020-08-27 MED ORDER — ACETAMINOPHEN 325 MG PO TABS
ORAL_TABLET | ORAL | Status: AC
  Filled 2020-08-27: qty 2

## 2020-08-27 MED ORDER — HEPARIN SOD (PORK) LOCK FLUSH 100 UNIT/ML IV SOLN
500.0000 [IU] | Freq: Once | INTRAVENOUS | Status: AC | PRN
  Administered 2020-08-27: 500 [IU]
  Filled 2020-08-27: qty 5

## 2020-08-27 MED ORDER — SODIUM CHLORIDE 0.9 % IV SOLN
10.0000 mg | Freq: Once | INTRAVENOUS | Status: AC
  Administered 2020-08-27: 10 mg via INTRAVENOUS
  Filled 2020-08-27: qty 10
  Filled 2020-08-27: qty 1

## 2020-08-27 MED ORDER — LORAZEPAM 0.5 MG PO TABS: 0.5000 mg | ORAL_TABLET | Freq: Every day | ORAL | 0 refills | Status: DC | PRN

## 2020-08-27 MED ORDER — ATROPINE SULFATE 1 MG/ML IJ SOLN
INTRAMUSCULAR | Status: AC
  Filled 2020-08-27: qty 1

## 2020-08-27 MED ORDER — DIPHENHYDRAMINE HCL 50 MG/ML IJ SOLN
INTRAMUSCULAR | Status: AC
  Filled 2020-08-27: qty 1

## 2020-08-27 MED ORDER — PALONOSETRON HCL INJECTION 0.25 MG/5ML
INTRAVENOUS | Status: AC
  Filled 2020-08-27: qty 5

## 2020-08-27 MED ORDER — FAMOTIDINE IN NACL 20-0.9 MG/50ML-% IV SOLN
INTRAVENOUS | Status: AC
  Filled 2020-08-27: qty 50

## 2020-08-27 MED ORDER — FAMOTIDINE IN NACL 20-0.9 MG/50ML-% IV SOLN
20.0000 mg | Freq: Once | INTRAVENOUS | Status: AC
  Administered 2020-08-27: 20 mg via INTRAVENOUS

## 2020-08-27 NOTE — Telephone Encounter (Signed)
Per MD proceed with treatment with heart rate noted of 109 bpm.

## 2020-08-27 NOTE — Patient Instructions (Signed)
[facility] Discharge Instructions for Patients Receiving Chemotherapy  Today you received the following chemotherapy agents: trodelvy  To help prevent nausea and vomiting after your treatment, we encourage you to take your nausea medication as directed.    If you develop nausea and vomiting that is not controlled by your nausea medication, call the clinic.   BELOW ARE SYMPTOMS THAT SHOULD BE REPORTED IMMEDIATELY:  *FEVER GREATER THAN 100.5 F  *CHILLS WITH OR WITHOUT FEVER  NAUSEA AND VOMITING THAT IS NOT CONTROLLED WITH YOUR NAUSEA MEDICATION  *UNUSUAL SHORTNESS OF BREATH  *UNUSUAL BRUISING OR BLEEDING  TENDERNESS IN MOUTH AND THROAT WITH OR WITHOUT PRESENCE OF ULCERS  *URINARY PROBLEMS  *BOWEL PROBLEMS  UNUSUAL RASH Items with * indicate a potential emergency and should be followed up as soon as possible.  Feel free to call the clinic should you have any questions or concerns. The clinic phone number is [phone].  Please show the CHEMO ALERT CARD at check-in to the Emergency Department and triage nurse.   

## 2020-08-27 NOTE — Patient Instructions (Signed)

## 2020-08-28 ENCOUNTER — Telehealth: Payer: Self-pay | Admitting: Oncology

## 2020-08-28 LAB — CANCER ANTIGEN 27.29: CA 27.29: 238.4 U/mL — ABNORMAL HIGH (ref 0.0–38.6)

## 2020-08-28 NOTE — Telephone Encounter (Signed)
Scheduled per 9/23 los. Called and spoke with pt, confirmed added appts

## 2020-08-29 ENCOUNTER — Inpatient Hospital Stay: Payer: Medicaid Other

## 2020-08-29 ENCOUNTER — Other Ambulatory Visit: Payer: Self-pay

## 2020-08-29 VITALS — BP 117/78 | HR 101 | Temp 97.7°F | Resp 20

## 2020-08-29 DIAGNOSIS — C50411 Malignant neoplasm of upper-outer quadrant of right female breast: Secondary | ICD-10-CM

## 2020-08-29 DIAGNOSIS — C7951 Secondary malignant neoplasm of bone: Secondary | ICD-10-CM

## 2020-08-29 DIAGNOSIS — Z7189 Other specified counseling: Secondary | ICD-10-CM

## 2020-08-29 DIAGNOSIS — Z5112 Encounter for antineoplastic immunotherapy: Secondary | ICD-10-CM | POA: Diagnosis not present

## 2020-08-29 DIAGNOSIS — C50919 Malignant neoplasm of unspecified site of unspecified female breast: Secondary | ICD-10-CM

## 2020-08-29 MED ORDER — PEGFILGRASTIM-CBQV 6 MG/0.6ML ~~LOC~~ SOSY
6.0000 mg | PREFILLED_SYRINGE | Freq: Once | SUBCUTANEOUS | Status: AC
  Administered 2020-08-29: 6 mg via SUBCUTANEOUS

## 2020-08-29 NOTE — Patient Instructions (Signed)

## 2020-09-03 ENCOUNTER — Inpatient Hospital Stay: Payer: Medicaid Other

## 2020-09-05 ENCOUNTER — Inpatient Hospital Stay: Payer: Medicaid Other

## 2020-09-08 NOTE — Progress Notes (Signed)
[facility] OFFICE PROGRESS NOTE  Dorena Dew, FNP [address] [place] [address]  DIAGNOSIS: Triple negative stage IV breast cancer  CURRENT THERAPY: sacituzumab/Trodelvy   INTERVAL HISTORY: Rebecca [name] 44 y.o. female returns to the clinic today for a follow-up visit.  The patient was last seen in the clinic approximately 2 weeks ago by Dr. [name].  At that time, Dr. [name] had omitted day 8 of her treatment secondary to febrile neutropenia and change her dosing to be given every 2 weeks.  Going forward, she will receive Udenyca on day 3 of every cycle, her next injection will be 09/12/20.  The patient tolerated her last cycle treatment fairly well without any concerning adverse side effects. She just reports fatigue/feeling sleepy sometimes. She takes oxycodone 3 times daily as needed and Ativan every evening to help with her anxiety.  She takes MiraLAX for constipation if needed.  In the interval she denies any recent signs and symptoms of infection including fever, chills, night sweats, sore throat, cough, shortness of breath, skin infections, diarrhea, or dysuria. She did have some nasal congestion last week that she took mucinex for which is improved. The patient denies any rashes or skin changes except she has the wound on her chest. She performs her wound care/dressing changes.  She denies any swelling.  The patient is here today for evaluation before starting cycle #3 of her treatment.  BREAST CANCER HISTORY: From the original intake note:  Rebecca [name] herself noted a change in her right breast sometime around September or October 2016. She did not bring it to intermediate medical attention, but on 01/13/2016 she established herself in Dr. Smith Robert' service and she was set up for bilateral diagnostic mammography with tomosynthesis and bilateral ultrasonography at the [facility] 01/19/2016. The breast density was category B. In the upper outer  quadrant of the right breast there was a spiculated mass measuring 2.8 cm. On physical exam this was palpable. Targeted ultrasonography confirmed an irregular hypoechoic mass in the right breast 11:30 o'clock position measuring 2.6 cm maximally. Ultrasound of the right axilla showed a morphologically abnormal lymph node.  In the left breast there were some tubular densities behind the areola which by ultrasonography showed benign ductal ectasia.   MEDICAL HISTORY: Past Medical History:  Diagnosis Date  . Anxiety   . Breast cancer ([place])   . Depression   . GERD (gastroesophageal reflux disease)   . History of radiation therapy 11/15/16-01/12/17   right chest wall and axilla treated to 45 Gy in 25 fractions, boosted and additional 14 Gy in 8 fractions  . Hypertension    diet controlled  . Obesity (BMI 35.0-39.9 without comorbidity)   . Pneumonia    as a child  . Seasonal allergies   . Sickle cell trait ([facility])   . Termination of pregnancy (fetus) 04/02/16    ALLERGIES:  has No Known Allergies.  MEDICATIONS:  Current Outpatient Medications  Medication Sig Dispense Refill  . calcium carbonate (TUMS - DOSED IN MG ELEMENTAL CALCIUM) 500 MG chewable tablet Chew 1 tablet by mouth daily as needed for indigestion or heartburn.    . lidocaine-prilocaine (EMLA) cream Apply to affected area once (Patient taking differently: Apply 1 application topically daily as needed (access port). Apply to affected area once) 30 g 3  . LORazepam (ATIVAN) 0.5 MG tablet Take 1 tablet (0.5 mg total) by mouth daily as needed for anxiety. 30 tablet 0  . oxyCODONE (OXY  IR/ROXICODONE) 5 MG immediate release tablet Take 1 tablet (5 mg total) by mouth every 8 (eight) hours as needed for severe pain. 90 tablet 0  . polyethylene glycol (MIRALAX / GLYCOLAX) 17 g packet Take 17 g by mouth daily. 14 each 0   No current facility-administered medications for this visit.   Facility-Administered Medications Ordered in Other  Visits  Medication Dose Route Frequency Provider Last Rate Last Admin  . 0.9 %  sodium chloride infusion   Intravenous Once Magrinat, [name], MD      . acetaminophen (TYLENOL) tablet 650 mg  650 mg Oral Once Magrinat, [name], MD      . atropine injection 0.5 mg  0.5 mg Intravenous Once PRN Magrinat, [name], MD      . dexamethasone (DECADRON) 10 mg in sodium chloride 0.9 % 50 mL IVPB  10 mg Intravenous Once Magrinat, [name], MD      . diphenhydrAMINE (BENADRYL) injection 50 mg  50 mg Intravenous Once Magrinat, [name], MD      . famotidine (PEPCID) IVPB 20 mg premix  20 mg Intravenous Once Magrinat, [name], MD      . fosaprepitant (EMEND) 150 mg in sodium chloride 0.9 % 145 mL IVPB  150 mg Intravenous Once Magrinat, [name], MD      . heparin lock flush 100 unit/mL  500 Units Intracatheter Once PRN Magrinat, [name], MD      . palonosetron (ALOXI) injection 0.25 mg  0.25 mg Intravenous Once Magrinat, [name], MD      . sacituzumab govitecan-hziy (TRODELVY) 900 mg in sodium chloride 0.9 % 250 mL (2.6471 mg/mL) chemo infusion  10 mg/kg (Order-Specific) Intravenous Once Magrinat, [name], MD      . sodium chloride flush (NS) 0.9 % injection 10 mL  10 mL Intracatheter PRN Magrinat, [name], MD        SURGICAL HISTORY:  Past Surgical History:  Procedure Laterality Date  . CESAREAN SECTION     2004 and 2007  . MASTECTOMY W/ SENTINEL NODE BIOPSY Right 09/06/2016   Procedure: RIGHT BREAST MASTECTOMY WITH RIGHT AXILLARY SENTINEL LYMPH NODE BIOPSY;  Surgeon: Alphonsa Overall, MD;  Location: [facility];  Service: General;  Laterality: Right;  . PORT-A-CATH REMOVAL Left 09/06/2016   Procedure: REMOVAL PORT-A-CATH;  Surgeon: Alphonsa Overall, MD;  Location: [facility];  Service: General;  Laterality: Left;  . PORTACATH PLACEMENT    . PORTACATH PLACEMENT N/A 07/09/2019   Procedure: INSERTION PORT-A-CATH WITH ULTRASOUND;  Surgeon: Alphonsa Overall, MD;  Location: [facility];   Service: General;  Laterality: N/A;    REVIEW OF SYSTEMS:   Review of Systems  Constitutional: Positive for fatigue. Negative for appetite change, chills, fever and unexpected weight change.  HENT: Negative for mouth sores, nosebleeds, sore throat and trouble swallowing.   Eyes: Negative for eye problems and icterus.  Respiratory: Negative for cough, hemoptysis, shortness of breath and wheezing.   Cardiovascular: Negative for chest pain and leg swelling.  Gastrointestinal: Negative for abdominal pain, constipation, diarrhea, nausea and vomiting.  Genitourinary: Negative for bladder incontinence, difficulty urinating, dysuria, frequency and hematuria.   Musculoskeletal: Negative for back pain, gait problem, neck pain and neck stiffness.  Skin: Positive for wound from mastectomy on right chest wall. Negative for itching and rash.  Neurological: Negative for dizziness, extremity weakness, gait problem, headaches, light-headedness and seizures.  Hematological: Negative for adenopathy. Does not bruise/bleed easily.  Psychiatric/Behavioral: Negative for confusion, depression  and sleep disturbance. The patient is not nervous/anxious.     PHYSICAL EXAMINATION:  Blood pressure 116/77, pulse 100, temperature 98.2 F (36.8 C), temperature source Tympanic, resp. rate 18, height 5' (1.524 m), weight 187 lb 1.6 oz (84.9 kg), SpO2 100 %.  ECOG PERFORMANCE STATUS: 1 - Symptomatic but completely ambulatory  Physical Exam  Constitutional: Oriented to person, place, and time and well-developed, well-nourished, and in no distress.  HENT:  Head: Normocephalic and atraumatic.  Mouth/Throat: Oropharynx is clear and moist. No oropharyngeal exudate.  Eyes: Conjunctivae are normal. Right eye exhibits no discharge. Left eye exhibits no discharge. No scleral icterus.  Neck: Normal range of motion. Neck supple.  Cardiovascular: Normal rate, regular rhythm, normal heart sounds and intact distal pulses.    Pulmonary/Chest: Effort normal and breath sounds normal. No respiratory distress. No wheezes. No rales.  Abdominal: Soft. Bowel sounds are normal. Exhibits no distension and no mass. There is no tenderness.  Musculoskeletal: Normal range of motion. Exhibits no edema.  Lymphadenopathy:    No cervical adenopathy.  Neurological: Alert and oriented to person, place, and time. Exhibits normal muscle tone. Gait normal. Coordination normal.  Skin: The right breast is status post mastectomy.  The area of chest wall involvement is bandaged over and the bandages clean and dry. No evidence of erythema or purulent drainage. The area has not began to scab over.  Psychiatric: Mood, memory and judgment normal.  Vitals reviewed.  LABORATORY DATA: Lab Results  Component Value Date   WBC 9.6 09/10/2020   HGB 8.0 (L) 09/10/2020   HCT 24.6 (L) 09/10/2020   MCV 87.5 09/10/2020   PLT 284 09/10/2020      Chemistry      Component Value Date/Time   NA 139 09/10/2020 1120   NA 143 10/04/2016 1154   K 3.7 09/10/2020 1120   K 3.8 10/04/2016 1154   CL 102 09/10/2020 1120   CO2 31 09/10/2020 1120   CO2 26 10/04/2016 1154   BUN 9 09/10/2020 1120   BUN 11.4 10/04/2016 1154   CREATININE 0.75 09/10/2020 1120   CREATININE 0.80 08/07/2020 1129   CREATININE 0.8 10/04/2016 1154      Component Value Date/Time   CALCIUM 9.8 09/10/2020 1120   CALCIUM 9.7 10/04/2016 1154   ALKPHOS 78 09/10/2020 1120   ALKPHOS 72 10/04/2016 1154   AST 16 09/10/2020 1120   AST 13 (L) 08/07/2020 1129   AST 11 10/04/2016 1154   ALT 16 09/10/2020 1120   ALT 10 08/07/2020 1129   ALT 10 10/04/2016 1154   BILITOT 0.3 09/10/2020 1120   BILITOT 0.3 08/07/2020 1129   BILITOT <0.22 10/04/2016 1154       RADIOGRAPHIC STUDIES:  DG Chest Portable 1 View  Result Date: 08/14/2020 CLINICAL DATA:  Fever. EXAM: PORTABLE CHEST 1 VIEW COMPARISON:  Most recent chest CT 08/04/2020. FINDINGS: Accessed left chest port with tip in the SVC.  Lung volumes are low. No focal airspace disease. Normal heart size with normal mediastinal contours. No pulmonary edema. No pleural effusion or pneumothorax. No acute osseous abnormalities are seen. IMPRESSION: Low lung volumes without acute abnormality. Electronically Signed   By: Keith Rake M.D.   On: 08/14/2020 18:30      ASSESSMENT: 44 y.o. [place] woman status post right breast upper-outer quadrant biopsy 01/28/2016 for a clinical T2 N0 invasive ductal carcinoma, grade 3, triple negative, with an MIB-1 of 70%.             (a)  suspicious right axillary lymph node biopsied 01/28/2016 was benign  (1) neoadjuvant chemotherapy: doxorubicin and cyclophosphamide in dose dense fashion 4 started 04/14/16, completed 05/26/2016, followed by paclitaxel and carboplatin weekly 12, Started 06/09/2016             (a) taxol discontinued after 7 doses because of neuropathy, last dose 07/21/2016  (2) genetics testing November 16, 2017through the 32-gene Comprehensive Cancer Panel offered by [facility] Junius Roads, MD) (with MSH2Exons 1-7 Inversion Analysis) found no deleterious mutations or VUSS  In APC, ATM, AXIN2, BARD1, BMPR1A, BRCA1, BRCA2, BRIP1, CDH1, CDK4, CDKN2A, CHEK2, EPCAM, FANCC, MLH1, MSH2, MSH6, MUTYH, NBN, PALB2, PMS2, POLD1, POLE, PTEN, RAD51C, RAD51D, SCG5/GREM1, SMAD4, STK11, TP53, VHL, and XRCC2.   (3) right mastectomy and sentinel lymph node sampling 09/06/2016 showed a residual ypT1c ypN0, invasive ductal carcinoma, grade 3, with negative margins. Repeat prognostic panel again triple negative   (4) adjuvant radiation with capecitabine/Xeloda sensitization 11/15/16 - 01/12/17 Site/dose:Right Chest Wall and axilla (4 field)treated to 45 Gy in 25 fractions, and then Boosted an additional 14.4 Gy in 8 fractions.  (5) tobacco abuse disorder: The patient quit smoking 04/04/2018  METASTATIC DISEASE:  July 2020: chest wall, bones, nodes (6) nonspecific changes  noted on chest CT 06/11/2019 were clarified by PET scan 06/19/2019 showing hypermetabolic disease in the right anterior chest wall, right internal mammary nodes, right and left axillary nodes, but no metastatic disease in the neck, lungs, abdomen or pelvis.  Bone marrow uptake suggests bony metastatic disease.             (a) CARIS requested obtained from 06/28/2019 sample confirmed a triple negativity, the tumor also was negative for the androgen receptor, was MSI stable and mismatch repair status proficient, with a low mutational burden.  BRCA 1 and 2 were negative and PD-L1 was negative.  PI K3 showed a variant of uncertain significance.  However the tumor was genomic LOH high             (b) CA-27-29 is informative: was 118.3 on 06/04/2019  (7) zoledronate started 07/17/2019--on hold currently due to dental issues, and until 6 weeks at least after dental work  (8) carboplatin/ gemcitabine days 1 and 8 Q21 day cycle started 07/10/2019, last dose 10/30/2019             (a) restaging studies 09/2019 showed no progression             (b) restaging studies after 6 cycles showed mild disease progression  (9) cyclophosphamide, methotrexate, fluorouracil chemotherapy started 11/13/2019, repeated every 21 days.             (a) discontinued after cycle 3 (12/31/2019): No evidence of response  (10) started capecitabine 01/21/2020, given 1 week on and 1 week off at standard doses (1500 mg twice daily)              (a) Discontinued after almost three months of treatment (last on 04/06/2020)             (b) Disease progression on 4/27 CT scan showing increasing right chest wall disease, left breast lesions, and ? Liver involvement.             (c) MRI liver 04/14/2020 shows no liver invovlement  (11) Started liposomal doxorubicin/Doxil given on day 1 of a 21 day cycle on 04/07/2020             (a) echo on 04/02/2020 shows EF of 50-55%, repeat in 06/2020             (  b) chest CT scan after 4 cycles of Doxil  shows some evidence of progression in left axillary and right internal mammary adenopathy             (c) Doxil discontinued after 06/09/2020 dose  (12) started sacituzumab govitecan/ Trodelvy 07/10/2020             (a) day 8 cycle 2 omitted due to febrile neutropenia requiring admission             (b) changed to every two week dosing with udenyca on day 3.  Plan: This is a very pleasant 44 year old African-American female with metastatic triple negative breast cancer.  The patient is tolerating her current treatment well without any adverse side effects.  The patient's treatment is currently being given every 2 weeks with Udenyca on day 3.  Labs were reviewed with the patient. Her Hbg is 8.0 today. Discussed this with the patient and she feels that she would benefit from 1 unit of blood. She already has a sample to blood bank at the blood bank. We will see if we can add on 1 unit today. Recommend that she proceed with cycle #3 today as scheduled. She will receive her udenyca injection on 09/12/20 as scheduled.   We will see her back for follow-up visit in 2 weeks for evaluation before starting cycle #4.  I have refilled her ativan today if she runs out over the weekend. She will continue to use miralax as bowel prophylaxis and daily stool softeners as needed.   The patient was advised to call immediately if she has any concerning symptoms in the interval. The patient voices understanding of current disease status and treatment options and is in agreement with the current care plan. All questions were answered. The patient knows to call the clinic with any problems, questions or concerns. We can certainly see the patient much sooner if necessary     No orders of the defined types were placed in this encounter.    Pranavi Aure L Byrl Latin, PA-C 09/10/20

## 2020-09-10 ENCOUNTER — Other Ambulatory Visit: Payer: Self-pay

## 2020-09-10 ENCOUNTER — Inpatient Hospital Stay: Payer: Medicaid Other | Admitting: Medical

## 2020-09-10 ENCOUNTER — Inpatient Hospital Stay: Payer: Medicaid Other

## 2020-09-10 ENCOUNTER — Inpatient Hospital Stay: Payer: Medicaid Other | Attending: Oncology

## 2020-09-10 ENCOUNTER — Inpatient Hospital Stay (HOSPITAL_BASED_OUTPATIENT_CLINIC_OR_DEPARTMENT_OTHER): Payer: Medicaid Other | Admitting: Physician Assistant

## 2020-09-10 ENCOUNTER — Telehealth: Payer: Self-pay | Admitting: Physician Assistant

## 2020-09-10 ENCOUNTER — Other Ambulatory Visit: Payer: Self-pay | Admitting: Physician Assistant

## 2020-09-10 VITALS — BP 116/77 | HR 100 | Temp 98.2°F | Resp 18 | Ht 60.0 in | Wt 187.1 lb

## 2020-09-10 VITALS — BP 98/71 | HR 80 | Temp 97.8°F | Resp 16

## 2020-09-10 DIAGNOSIS — Z171 Estrogen receptor negative status [ER-]: Secondary | ICD-10-CM

## 2020-09-10 DIAGNOSIS — R5383 Other fatigue: Secondary | ICD-10-CM | POA: Diagnosis not present

## 2020-09-10 DIAGNOSIS — K219 Gastro-esophageal reflux disease without esophagitis: Secondary | ICD-10-CM | POA: Insufficient documentation

## 2020-09-10 DIAGNOSIS — Z87891 Personal history of nicotine dependence: Secondary | ICD-10-CM | POA: Insufficient documentation

## 2020-09-10 DIAGNOSIS — T451X5A Adverse effect of antineoplastic and immunosuppressive drugs, initial encounter: Secondary | ICD-10-CM | POA: Diagnosis not present

## 2020-09-10 DIAGNOSIS — Z7189 Other specified counseling: Secondary | ICD-10-CM

## 2020-09-10 DIAGNOSIS — Z79899 Other long term (current) drug therapy: Secondary | ICD-10-CM | POA: Insufficient documentation

## 2020-09-10 DIAGNOSIS — Z23 Encounter for immunization: Secondary | ICD-10-CM | POA: Insufficient documentation

## 2020-09-10 DIAGNOSIS — Z5189 Encounter for other specified aftercare: Secondary | ICD-10-CM | POA: Diagnosis not present

## 2020-09-10 DIAGNOSIS — C50411 Malignant neoplasm of upper-outer quadrant of right female breast: Secondary | ICD-10-CM | POA: Insufficient documentation

## 2020-09-10 DIAGNOSIS — C50919 Malignant neoplasm of unspecified site of unspecified female breast: Secondary | ICD-10-CM

## 2020-09-10 DIAGNOSIS — F418 Other specified anxiety disorders: Secondary | ICD-10-CM | POA: Diagnosis not present

## 2020-09-10 DIAGNOSIS — Z9011 Acquired absence of right breast and nipple: Secondary | ICD-10-CM | POA: Diagnosis not present

## 2020-09-10 DIAGNOSIS — D6481 Anemia due to antineoplastic chemotherapy: Secondary | ICD-10-CM

## 2020-09-10 DIAGNOSIS — C7951 Secondary malignant neoplasm of bone: Secondary | ICD-10-CM

## 2020-09-10 DIAGNOSIS — I10 Essential (primary) hypertension: Secondary | ICD-10-CM | POA: Diagnosis not present

## 2020-09-10 DIAGNOSIS — E669 Obesity, unspecified: Secondary | ICD-10-CM | POA: Diagnosis not present

## 2020-09-10 DIAGNOSIS — Z5112 Encounter for antineoplastic immunotherapy: Secondary | ICD-10-CM | POA: Diagnosis not present

## 2020-09-10 DIAGNOSIS — K5900 Constipation, unspecified: Secondary | ICD-10-CM | POA: Insufficient documentation

## 2020-09-10 LAB — COMPREHENSIVE METABOLIC PANEL
ALT: 16 U/L (ref 0–44)
AST: 16 U/L (ref 15–41)
Albumin: 3.5 g/dL (ref 3.5–5.0)
Alkaline Phosphatase: 78 U/L (ref 38–126)
Anion gap: 6 (ref 5–15)
BUN: 9 mg/dL (ref 6–20)
CO2: 31 mmol/L (ref 22–32)
Calcium: 9.8 mg/dL (ref 8.9–10.3)
Chloride: 102 mmol/L (ref 98–111)
Creatinine, Ser: 0.75 mg/dL (ref 0.44–1.00)
GFR calc non Af Amer: >60 mL/min (ref >60)
Glucose, Bld: 90 mg/dL (ref 70–99)
Potassium: 3.7 mmol/L (ref 3.5–5.1)
Sodium: 139 mmol/L (ref 135–145)
Total Bilirubin: 0.3 mg/dL (ref 0.3–1.2)
Total Protein: 7.7 g/dL (ref 6.5–8.1)

## 2020-09-10 LAB — CBC WITH DIFFERENTIAL/PLATELET
Abs Immature Granulocytes: 0.05 10*3/uL (ref 0.00–0.07)
Basophils Absolute: 0 10*3/uL (ref 0.0–0.1)
Basophils Relative: 0 %
Eosinophils Absolute: 0.1 10*3/uL (ref 0.0–0.5)
Eosinophils Relative: 1 %
HCT: 24.6 % — ABNORMAL LOW (ref 36.0–46.0)
Hemoglobin: 8 g/dL — ABNORMAL LOW (ref 12.0–15.0)
Immature Granulocytes: 1 %
Lymphocytes Relative: 17 %
Lymphs Abs: 1.6 10*3/uL (ref 0.7–4.0)
MCH: 28.5 pg (ref 26.0–34.0)
MCHC: 32.5 g/dL (ref 30.0–36.0)
MCV: 87.5 fL (ref 80.0–100.0)
Monocytes Absolute: 1 10*3/uL (ref 0.1–1.0)
Monocytes Relative: 10 %
Neutro Abs: 6.7 10*3/uL (ref 1.7–7.7)
Neutrophils Relative %: 71 %
Platelets: 284 10*3/uL (ref 150–400)
RBC: 2.81 MIL/uL — ABNORMAL LOW (ref 3.87–5.11)
RDW: 18.1 % — ABNORMAL HIGH (ref 11.5–15.5)
WBC: 9.6 10*3/uL (ref 4.0–10.5)
nRBC: 0 % (ref 0.0–0.2)

## 2020-09-10 LAB — PREPARE RBC (CROSSMATCH)

## 2020-09-10 LAB — SAMPLE TO BLOOD BANK

## 2020-09-10 MED ORDER — HEPARIN SOD (PORK) LOCK FLUSH 100 UNIT/ML IV SOLN
500.0000 [IU] | Freq: Once | INTRAVENOUS | Status: AC | PRN
  Administered 2020-09-10: 500 [IU]
  Filled 2020-09-10: qty 5

## 2020-09-10 MED ORDER — ACETAMINOPHEN 325 MG PO TABS
650.0000 mg | ORAL_TABLET | Freq: Once | ORAL | Status: AC
  Administered 2020-09-10: 650 mg via ORAL

## 2020-09-10 MED ORDER — SODIUM CHLORIDE 0.9% FLUSH
10.0000 mL | INTRAVENOUS | Status: DC | PRN
  Administered 2020-09-10: 10 mL
  Filled 2020-09-10: qty 10

## 2020-09-10 MED ORDER — DIPHENHYDRAMINE HCL 50 MG/ML IJ SOLN
INTRAMUSCULAR | Status: AC
  Filled 2020-09-10: qty 1

## 2020-09-10 MED ORDER — SODIUM CHLORIDE 0.9 % IV SOLN
Freq: Once | INTRAVENOUS | Status: AC
  Filled 2020-09-10: qty 250

## 2020-09-10 MED ORDER — FAMOTIDINE IN NACL 20-0.9 MG/50ML-% IV SOLN
INTRAVENOUS | Status: AC
  Filled 2020-09-10: qty 50

## 2020-09-10 MED ORDER — DIPHENHYDRAMINE HCL 50 MG/ML IJ SOLN
50.0000 mg | Freq: Once | INTRAMUSCULAR | Status: AC
  Administered 2020-09-10: 50 mg via INTRAVENOUS

## 2020-09-10 MED ORDER — PALONOSETRON HCL INJECTION 0.25 MG/5ML
INTRAVENOUS | Status: AC
  Filled 2020-09-10: qty 5

## 2020-09-10 MED ORDER — PALONOSETRON HCL INJECTION 0.25 MG/5ML
0.2500 mg | Freq: Once | INTRAVENOUS | Status: AC
  Administered 2020-09-10: 0.25 mg via INTRAVENOUS

## 2020-09-10 MED ORDER — SODIUM CHLORIDE 0.9 % IV SOLN
10.0000 mg | Freq: Once | INTRAVENOUS | Status: AC
  Administered 2020-09-10: 10 mg via INTRAVENOUS
  Filled 2020-09-10: qty 10

## 2020-09-10 MED ORDER — SODIUM CHLORIDE 0.9 % IV SOLN
150.0000 mg | Freq: Once | INTRAVENOUS | Status: AC
  Administered 2020-09-10: 150 mg via INTRAVENOUS
  Filled 2020-09-10: qty 150

## 2020-09-10 MED ORDER — LORAZEPAM 0.5 MG PO TABS: 0.5000 mg | ORAL_TABLET | Freq: Every day | ORAL | 0 refills | Status: DC | PRN

## 2020-09-10 MED ORDER — FAMOTIDINE IN NACL 20-0.9 MG/50ML-% IV SOLN
20.0000 mg | Freq: Once | INTRAVENOUS | Status: AC
  Administered 2020-09-10: 20 mg via INTRAVENOUS

## 2020-09-10 MED ORDER — INFLUENZA VAC SPLIT QUAD 0.5 ML IM SUSY
0.5000 mL | PREFILLED_SYRINGE | Freq: Once | INTRAMUSCULAR | Status: AC
  Administered 2020-09-10: 0.5 mL via INTRAMUSCULAR

## 2020-09-10 MED ORDER — ACETAMINOPHEN 325 MG PO TABS
ORAL_TABLET | ORAL | Status: AC
  Filled 2020-09-10: qty 2

## 2020-09-10 MED ORDER — ATROPINE SULFATE 1 MG/ML IJ SOLN: 0.5000 mg | Freq: Once | INTRAMUSCULAR | Status: DC | PRN

## 2020-09-10 MED ORDER — INFLUENZA VAC SPLIT QUAD 0.5 ML IM SUSY
PREFILLED_SYRINGE | INTRAMUSCULAR | Status: AC
  Filled 2020-09-10: qty 0.5

## 2020-09-10 MED ORDER — SODIUM CHLORIDE 0.9 % IV SOLN
10.0000 mg/kg | Freq: Once | INTRAVENOUS | Status: AC
  Administered 2020-09-10: 900 mg via INTRAVENOUS
  Filled 2020-09-10: qty 90

## 2020-09-10 NOTE — Telephone Encounter (Signed)
Opened in error

## 2020-09-10 NOTE — Patient Instructions (Addendum)
[facility] Discharge Instructions for Patients Receiving Chemotherapy  Today you received the following chemotherapy agents: Trodelvy.  To help prevent nausea and vomiting after your treatment, we encourage you to take your nausea medication as directed.   If you develop nausea and vomiting that is not controlled by your nausea medication, call the clinic.   BELOW ARE SYMPTOMS THAT SHOULD BE REPORTED IMMEDIATELY:  *FEVER GREATER THAN 100.5 F  *CHILLS WITH OR WITHOUT FEVER  NAUSEA AND VOMITING THAT IS NOT CONTROLLED WITH YOUR NAUSEA MEDICATION  *UNUSUAL SHORTNESS OF BREATH  *UNUSUAL BRUISING OR BLEEDING  TENDERNESS IN MOUTH AND THROAT WITH OR WITHOUT PRESENCE OF ULCERS  *URINARY PROBLEMS  *BOWEL PROBLEMS  UNUSUAL RASH Items with * indicate a potential emergency and should be followed up as soon as possible.  Feel free to call the clinic should you have any questions or concerns. The clinic phone number is [phone].  Please show the CHEMO ALERT CARD at check-in to the Emergency Department and triage nurse.  Blood Transfusion, Adult A blood transfusion is a procedure in which you receive blood or a type of blood cell (blood component) through an IV. You may need a blood transfusion when your blood level is low. This may result from a bleeding disorder, illness, injury, or surgery. The blood may come from a donor. You may also be able to donate blood for yourself (autologous blood donation) before a planned surgery. The blood given in a transfusion is made up of different blood components. You may receive:  Red blood cells. These carry oxygen to the cells in the body.  Platelets. These help your blood to clot.  Plasma. This is the liquid part of your blood. It carries proteins and other substances throughout the body.  White blood cells. These help you fight infections. If you have hemophilia or another clotting disorder, you may also receive other types  of blood products. Tell a health care provider about:  Any blood disorders you have.  Any previous reactions you have had during a blood transfusion.  Any allergies you have.  All medicines you are taking, including vitamins, herbs, eye drops, creams, and over-the-counter medicines.  Any surgeries you have had.  Any medical conditions you have, including any recent fever or cold symptoms.  Whether you are pregnant or may be pregnant. What are the risks? Generally, this is a safe procedure. However, problems may occur.  The most common problems include: ? A mild allergic reaction, such as red, swollen areas of skin (hives) and itching. ? Fever or chills. This may be the body's response to new blood cells received. This may occur during or up to 4 hours after the transfusion.  More serious problems may include: ? Transfusion-associated circulatory overload (TACO), or too much fluid in the lungs. This may cause breathing problems. ? A serious allergic reaction, such as difficulty breathing or swelling around the face and lips. ? Transfusion-related acute lung injury (TRALI), which causes breathing difficulty and low oxygen in the blood. This can occur within hours of the transfusion or several days later. ? Iron overload. This can happen after receiving many blood transfusions over a period of time. ? Infection or virus being transmitted. This is rare because donated blood is carefully tested before it is given. ? Hemolytic transfusion reaction. This is rare. It happens when your body's defense system (immune system)tries to attack the new blood cells. Symptoms may include fever, chills, nausea, low blood pressure, and low   back or chest pain. ? Transfusion-associated graft-versus-host disease (TAGVHD). This is rare. It happens when donated cells attack your body's healthy tissues. What happens before the procedure? Medicines Ask your health care provider about:  Changing or stopping  your regular medicines. This is especially important if you are taking diabetes medicines or blood thinners.  Taking medicines such as aspirin and ibuprofen. These medicines can thin your blood. Do not take these medicines unless your health care provider tells you to take them.  Taking over-the-counter medicines, vitamins, herbs, and supplements. General instructions  Follow instructions from your health care provider about eating and drinking restrictions.  You will have a blood test to determine your blood type. This is necessary to know what kind of blood your body will accept and to match it to the donor blood.  If you are going to have a planned surgery, you may be able to do an autologous blood donation. This may be done in case you need to have a transfusion.  You will have your temperature, blood pressure, and pulse monitored before the transfusion.  If you have had an allergic reaction to a transfusion in the past, you may be given medicine to help prevent a reaction. This medicine may be given to you by mouth (orally) or through an IV.  Set aside time for the blood transfusion. This procedure generally takes 1-4 hours to complete. What happens during the procedure?   An IV will be inserted into one of your veins.  The bag of donated blood will be attached to your IV. The blood will then enter through your vein.  Your temperature, blood pressure, and pulse will be monitored regularly during the transfusion. This monitoring is done to detect early signs of a transfusion reaction.  Tell your nurse right away if you have any of these symptoms during the transfusion: ? Shortness of breath or trouble breathing. ? Chest or back pain. ? Fever or chills. ? Hives or itching.  If you have any signs or symptoms of a reaction, your transfusion will be stopped and you may be given medicine.  When the transfusion is complete, your IV will be removed.  Pressure may be applied to the  IV site for a few minutes.  A bandage (dressing)will be applied. The procedure may vary among health care providers and hospitals. What happens after the procedure?  Your temperature, blood pressure, pulse, breathing rate, and blood oxygen level will be monitored until you leave the hospital or clinic.  Your blood may be tested to see how you are responding to the transfusion.  You may be warmed with fluids or blankets to maintain a normal body temperature.  If you receive your blood transfusion in an outpatient setting, you will be told whom to contact to report any reactions. Where to find more information For more information on blood transfusions, visit the [employer]: redcross.org Summary  A blood transfusion is a procedure in which you receive blood or a type of blood cell (blood component) through an IV.  The blood you receive may come from a donor or be donated by yourself (autologous blood donation) before a planned surgery.  The blood given in a transfusion is made up of different blood components. You may receive red blood cells, platelets, plasma, or white blood cells depending on the condition treated.  Your temperature, blood pressure, and pulse will be monitored before, during, and after the transfusion.  After the transfusion, your blood may be tested   to see how your body has responded. This information is not intended to replace advice given to you by your health care provider. Make sure you discuss any questions you have with your health care provider. Document Revised: 05/16/2019 Document Reviewed: 05/16/2019 Elsevier Patient Education  2020 [employer]  

## 2020-09-10 NOTE — Patient Instructions (Signed)

## 2020-09-10 NOTE — Addendum Note (Signed)
Addended by: Wilmon Arms on: 09/10/2020 12:42 PM   Modules accepted: Orders

## 2020-09-11 LAB — BPAM RBC
Blood Product Expiration Date: 202111012359
ISSUE DATE / TIME: 202110071437
Unit Type and Rh: 5100

## 2020-09-11 LAB — TYPE AND SCREEN
ABO/RH(D): O POS
Antibody Screen: NEGATIVE
Unit division: 0

## 2020-09-12 ENCOUNTER — Other Ambulatory Visit: Payer: Self-pay

## 2020-09-12 ENCOUNTER — Inpatient Hospital Stay: Payer: Medicaid Other

## 2020-09-12 VITALS — BP 102/72 | HR 94 | Temp 98.8°F | Resp 17

## 2020-09-12 DIAGNOSIS — Z5112 Encounter for antineoplastic immunotherapy: Secondary | ICD-10-CM | POA: Diagnosis not present

## 2020-09-12 MED ORDER — PEGFILGRASTIM-CBQV 6 MG/0.6ML ~~LOC~~ SOSY
6.0000 mg | PREFILLED_SYRINGE | Freq: Once | SUBCUTANEOUS | Status: AC
  Administered 2020-09-12: 6 mg via SUBCUTANEOUS

## 2020-09-12 NOTE — Patient Instructions (Signed)

## 2020-09-24 ENCOUNTER — Other Ambulatory Visit: Payer: Self-pay

## 2020-09-24 ENCOUNTER — Inpatient Hospital Stay: Payer: Medicaid Other

## 2020-09-24 ENCOUNTER — Inpatient Hospital Stay (HOSPITAL_BASED_OUTPATIENT_CLINIC_OR_DEPARTMENT_OTHER): Payer: Medicaid Other | Admitting: Oncology

## 2020-09-24 VITALS — BP 114/75 | HR 94 | Temp 97.5°F | Resp 18 | Ht 60.0 in | Wt 186.3 lb

## 2020-09-24 DIAGNOSIS — C50411 Malignant neoplasm of upper-outer quadrant of right female breast: Secondary | ICD-10-CM

## 2020-09-24 DIAGNOSIS — Z171 Estrogen receptor negative status [ER-]: Secondary | ICD-10-CM

## 2020-09-24 DIAGNOSIS — C50919 Malignant neoplasm of unspecified site of unspecified female breast: Secondary | ICD-10-CM

## 2020-09-24 DIAGNOSIS — Z5112 Encounter for antineoplastic immunotherapy: Secondary | ICD-10-CM | POA: Diagnosis not present

## 2020-09-24 DIAGNOSIS — C7951 Secondary malignant neoplasm of bone: Secondary | ICD-10-CM

## 2020-09-24 DIAGNOSIS — Z7189 Other specified counseling: Secondary | ICD-10-CM

## 2020-09-24 LAB — RETICULOCYTES
Immature Retic Fract: 12.2 % (ref 2.3–15.9)
RBC.: 2.97 MIL/uL — ABNORMAL LOW (ref 3.87–5.11)
Retic Count, Absolute: 21.7 10*3/uL (ref 19.0–186.0)
Retic Ct Pct: 0.7 % (ref 0.4–3.1)

## 2020-09-24 LAB — COMPREHENSIVE METABOLIC PANEL
ALT: 12 U/L (ref 0–44)
AST: 12 U/L — ABNORMAL LOW (ref 15–41)
Albumin: 3.5 g/dL (ref 3.5–5.0)
Alkaline Phosphatase: 87 U/L (ref 38–126)
Anion gap: 8 (ref 5–15)
BUN: 7 mg/dL (ref 6–20)
CO2: 30 mmol/L (ref 22–32)
Calcium: 9.7 mg/dL (ref 8.9–10.3)
Chloride: 103 mmol/L (ref 98–111)
Creatinine, Ser: 0.72 mg/dL (ref 0.44–1.00)
GFR, Estimated: >60 mL/min (ref >60)
Glucose, Bld: 102 mg/dL — ABNORMAL HIGH (ref 70–99)
Potassium: 3.6 mmol/L (ref 3.5–5.1)
Sodium: 141 mmol/L (ref 135–145)
Total Bilirubin: <0.2 mg/dL — ABNORMAL LOW (ref 0.3–1.2)
Total Protein: 7.4 g/dL (ref 6.5–8.1)

## 2020-09-24 LAB — CBC WITH DIFFERENTIAL/PLATELET
Abs Immature Granulocytes: 0.07 10*3/uL (ref 0.00–0.07)
Basophils Absolute: 0.1 10*3/uL (ref 0.0–0.1)
Basophils Relative: 1 %
Eosinophils Absolute: 0.1 10*3/uL (ref 0.0–0.5)
Eosinophils Relative: 1 %
HCT: 26.2 % — ABNORMAL LOW (ref 36.0–46.0)
Hemoglobin: 8.6 g/dL — ABNORMAL LOW (ref 12.0–15.0)
Immature Granulocytes: 1 %
Lymphocytes Relative: 13 %
Lymphs Abs: 1.5 10*3/uL (ref 0.7–4.0)
MCH: 28.7 pg (ref 26.0–34.0)
MCHC: 32.8 g/dL (ref 30.0–36.0)
MCV: 87.3 fL (ref 80.0–100.0)
Monocytes Absolute: 0.9 10*3/uL (ref 0.1–1.0)
Monocytes Relative: 8 %
Neutro Abs: 8.5 10*3/uL — ABNORMAL HIGH (ref 1.7–7.7)
Neutrophils Relative %: 76 %
Platelets: 252 10*3/uL (ref 150–400)
RBC: 3 MIL/uL — ABNORMAL LOW (ref 3.87–5.11)
RDW: 18 % — ABNORMAL HIGH (ref 11.5–15.5)
WBC: 11 10*3/uL — ABNORMAL HIGH (ref 4.0–10.5)
nRBC: 0 % (ref 0.0–0.2)

## 2020-09-24 LAB — SAMPLE TO BLOOD BANK

## 2020-09-24 LAB — FERRITIN: Ferritin: 1099 ng/mL — ABNORMAL HIGH (ref 11–307)

## 2020-09-24 MED ORDER — DIPHENHYDRAMINE HCL 50 MG/ML IJ SOLN
INTRAMUSCULAR | Status: AC
  Filled 2020-09-24: qty 1

## 2020-09-24 MED ORDER — SODIUM CHLORIDE 0.9% FLUSH
10.0000 mL | INTRAVENOUS | Status: DC | PRN
  Administered 2020-09-24: 10 mL
  Filled 2020-09-24: qty 10

## 2020-09-24 MED ORDER — HEPARIN SOD (PORK) LOCK FLUSH 100 UNIT/ML IV SOLN
500.0000 [IU] | Freq: Once | INTRAVENOUS | Status: AC | PRN
  Administered 2020-09-24: 500 [IU]
  Filled 2020-09-24: qty 5

## 2020-09-24 MED ORDER — FAMOTIDINE IN NACL 20-0.9 MG/50ML-% IV SOLN
20.0000 mg | Freq: Once | INTRAVENOUS | Status: AC
  Administered 2020-09-24: 20 mg via INTRAVENOUS

## 2020-09-24 MED ORDER — SODIUM CHLORIDE 0.9 % IV SOLN
150.0000 mg | Freq: Once | INTRAVENOUS | Status: AC
  Administered 2020-09-24: 150 mg via INTRAVENOUS
  Filled 2020-09-24: qty 150

## 2020-09-24 MED ORDER — ATROPINE SULFATE 1 MG/ML IJ SOLN: 0.5000 mg | Freq: Once | INTRAMUSCULAR | Status: DC | PRN

## 2020-09-24 MED ORDER — SODIUM CHLORIDE 0.9 % IV SOLN
10.0000 mg/kg | Freq: Once | INTRAVENOUS | Status: AC
  Administered 2020-09-24: 900 mg via INTRAVENOUS
  Filled 2020-09-24: qty 90

## 2020-09-24 MED ORDER — ACETAMINOPHEN 325 MG PO TABS
650.0000 mg | ORAL_TABLET | Freq: Once | ORAL | Status: AC
  Administered 2020-09-24: 650 mg via ORAL

## 2020-09-24 MED ORDER — FAMOTIDINE IN NACL 20-0.9 MG/50ML-% IV SOLN
INTRAVENOUS | Status: AC
  Filled 2020-09-24: qty 50

## 2020-09-24 MED ORDER — DIPHENHYDRAMINE HCL 50 MG/ML IJ SOLN
50.0000 mg | Freq: Once | INTRAMUSCULAR | Status: AC
  Administered 2020-09-24: 50 mg via INTRAVENOUS

## 2020-09-24 MED ORDER — ACETAMINOPHEN 325 MG PO TABS
ORAL_TABLET | ORAL | Status: AC
  Filled 2020-09-24: qty 2

## 2020-09-24 MED ORDER — PALONOSETRON HCL INJECTION 0.25 MG/5ML
0.2500 mg | Freq: Once | INTRAVENOUS | Status: AC
  Administered 2020-09-24: 0.25 mg via INTRAVENOUS

## 2020-09-24 MED ORDER — PALONOSETRON HCL INJECTION 0.25 MG/5ML
INTRAVENOUS | Status: AC
  Filled 2020-09-24: qty 5

## 2020-09-24 MED ORDER — SODIUM CHLORIDE 0.9 % IV SOLN
10.0000 mg | Freq: Once | INTRAVENOUS | Status: AC
  Administered 2020-09-24: 10 mg via INTRAVENOUS
  Filled 2020-09-24: qty 10

## 2020-09-24 MED ORDER — SODIUM CHLORIDE 0.9 % IV SOLN
Freq: Once | INTRAVENOUS | Status: AC
  Filled 2020-09-24: qty 250

## 2020-09-24 NOTE — Progress Notes (Signed)
[place]  [phone] [phone]) [phone]   ID: Unknown Rebecca [name] DOB: 08-09-1976  MR#: [id number]  EHO#:[id number]  Patient Care Team: Dorena Dew, FNP as PCP - General (Family Medicine) Alphonsa Overall, MD as Consulting Physician (General Surgery) Haly Feher, [name], MD as Consulting Physician (Oncology) Gery Pray, MD as Consulting Physician (Radiation Oncology) Delice Bison, Charlestine Massed, NP as Nurse Practitioner (Hematology and Oncology) Alda Berthold, DO as Consulting Physician (Neurology) OTHER MD:  CHIEF COMPLAINT: triple negative stage IV breast cancer  CURRENT TREATMENT: sacituzumab/Trodelvy    INTERVAL HISTORY: Rebecca [name] returns today for follow up and treatment of her triple negative metastatic breast cancer  She continues on Trodelvy, now given every 2 weeks, with Udenyca on day 3. Today is day 1 cycle 4. She is tolerating the treatment with no rash, no diarrhea, and only mild fatigue.  A detailed review of systems today was otherwise stable   REVIEW OF SYSTEMS: Rebecca [name] is tolerating treatment generally well.  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  She says in her own home things are "okay".  She has been packing the chest wall a little less often because it hurts when she pulls the resting.  She says the base is pink and she wanted me to make sure to look at it today.  She is also using a barrier cream which is helping.     BREAST CANCER HISTORY: From the original intake note:  Rebecca [name] herself noted a change in her right breast sometime around September or October 2016. She did not bring it to intermediate medical attention, but on 01/13/2016 she established herself in Dr. Smith Robert' service and she was set up for bilateral diagnostic mammography with tomosynthesis and bilateral ultrasonography at the [facility] 01/19/2016. The breast density was category B. In the upper outer quadrant of the right breast there  was a spiculated mass measuring 2.8 cm. On physical exam this was palpable. Targeted ultrasonography confirmed an irregular hypoechoic mass in the right breast 11:30 o'clock position measuring 2.6 cm maximally. Ultrasound of the right axilla showed a morphologically abnormal lymph node.  In the left breast there were some tubular densities behind the areola which by ultrasonography showed benign ductal ectasia.  On 01/28/2016 Rebecca [name] underwent biopsy of the right breast mass and abnormal right axillary lymph node. The pathology from this procedure (S [id number]) showed the lymph node to be benign. In the breast however there was an invasive ductal carcinoma, grade 3, which was estrogen and progesterone receptor negative. The proliferation marker was 70%. HER-2 was not amplified with a signals ratio of 1.32. The number per cell was 2.05.  The patient's subsequent history is as detailed below   PAST MEDICAL HISTORY: Past Medical History:  Diagnosis Date  . Anxiety   . Breast cancer ([place])   . Depression   . GERD (gastroesophageal reflux disease)   . History of radiation therapy 11/15/16-01/12/17   right chest wall and axilla treated to 45 Gy in 25 fractions, boosted and additional 14 Gy in 8 fractions  . Hypertension    diet controlled  . Obesity (BMI 35.0-39.9 without comorbidity)   . Pneumonia    as a child  . Seasonal allergies   . Sickle cell trait ([place])   . Termination of pregnancy (fetus) 04/02/16    PAST SURGICAL HISTORY: Past Surgical History:  Procedure Laterality Date  . CESAREAN SECTION     2004 and 2007  .  MASTECTOMY W/ SENTINEL NODE BIOPSY Right 09/06/2016   Procedure: RIGHT BREAST MASTECTOMY WITH RIGHT AXILLARY SENTINEL LYMPH NODE BIOPSY;  Surgeon: Alphonsa Overall, MD;  Location: [facility];  Service: General;  Laterality: Right;  . PORT-A-CATH REMOVAL Left 09/06/2016   Procedure: REMOVAL PORT-A-CATH;  Surgeon: Alphonsa Overall, MD;  Location: [facility];  Service: General;  Laterality: Left;  . PORTACATH PLACEMENT    . PORTACATH PLACEMENT N/A 07/09/2019   Procedure: INSERTION PORT-A-CATH WITH ULTRASOUND;  Surgeon: Alphonsa Overall, MD;  Location: [facility] OR;  Service: General;  Laterality: N/A;    FAMILY HISTORY Family History  Problem Relation Age of Onset  . Hypertension Mother   . Cancer Mother        dx "intestinal cancer" in her 52s; +surgery  . Other Mother        hysterectomy at young age for unspecified cause  . Heart Problems Mother   . Breast cancer Cousin        maternal 1st cousin dx female breast cancer at 7-46y  . Cancer Father   . Hypertension Father   . Heart Problems Maternal Aunt   . Diabetes Maternal Aunt   . Breast cancer Maternal Uncle        dx 64-65  . Heart Problems Maternal Uncle   . Breast cancer Maternal Grandmother 87  . Throat cancer Maternal Grandfather        d. 32s; smoker  . Sickle cell anemia Paternal Aunt   . Congestive Heart Failure Maternal Aunt   . Multiple sclerosis Cousin   . Cancer Other        maternal great uncle (MGM's brother); cancer removed from his side  . Heart attack Paternal Aunt        d. early 48s  The patient has very little information about her father. Her mother is currently 45 years old. She had a history of cervical cancer at age 77. The patient had 2 brothers, no sisters. The maternal grandfather had throat cancer. A maternal uncle was diagnosed with breast cancer as well as prostate cancer at the age of 55. 2 maternal cousins, one of them female, had breast cancer as well.   GYNECOLOGIC HISTORY:  No LMP recorded. Menarche age 65, first live birth age 25. The patient is GX P4. She was still having regular periods at the time of diagnosis. She took oral contraceptives in the 1990s with no side effects.--.  Stopped with chemotherapy and have not resumed as of May 2019   SOCIAL HISTORY:  (Updated 10/2018) She works as a Market researcher. The patient's significant other  [name] works at [employer].  At home with the patient are her 3 children [name], [place] and [place]. There are age 58, 1, and 39 as of November 2019.  2 of them are disabled or have significant health problems, one with sickle cell disease, the other with autism and developmental delay.  The patient's son [name], currently 77 years old, lives in [place].    ADVANCED DIRECTIVES: Not in place   HEALTH MAINTENANCE: Social History   Tobacco Use  . Smoking status: Former Smoker    Packs/day: 1.00    Years: 20.00    Pack years: 20.00    Types: Cigarettes    Quit date: 04/01/2018    Years since quitting: 2.4  . Smokeless tobacco: Never Used  . Tobacco comment: Patient has quit smoking x 1 year now  Vaping Use  . Vaping Use: Never used  Substance Use Topics  . Alcohol use: Yes    Comment: occ  . Drug use: No    Colonoscopy:  PAP:  Bone density:  Lipid panel:  No Known Allergies  Current Outpatient Medications on File Prior to Visit  Medication Sig Dispense Refill  . calcium carbonate (TUMS - DOSED IN MG ELEMENTAL CALCIUM) 500 MG chewable tablet Chew 1 tablet by mouth daily as needed for indigestion or heartburn.    . lidocaine-prilocaine (EMLA) cream Apply to affected area once (Patient taking differently: Apply 1 application topically daily as needed (access port). Apply to affected area once) 30 g 3  . LORazepam (ATIVAN) 0.5 MG tablet Take 1 tablet (0.5 mg total) by mouth daily as needed for anxiety. 30 tablet 0  . oxyCODONE (OXY IR/ROXICODONE) 5 MG immediate release tablet Take 1 tablet (5 mg total) by mouth every 8 (eight) hours as needed for severe pain. 90 tablet 0  . polyethylene glycol (MIRALAX / GLYCOLAX) 17 g packet Take 17 g by mouth daily. 14 each 0   No current facility-administered medications on file prior to visit.    OBJECTIVE: African-American woman who appears stated age  44:   09/24/20 1125  BP: 114/75   Pulse: 94  Resp: 18  Temp: (!) 97.5 F (36.4 C)  SpO2: 100%   Wt Readings from Last 3 Encounters:  09/24/20 186 lb 4.8 oz (84.5 kg)  09/10/20 187 lb 1.6 oz (84.9 kg)  08/27/20 192 lb 11.2 oz (87.4 kg)   Body mass index is 36.38 kg/m.    ECOG FS: 1  Sclerae unicteric, EOMs intact Wearing a mask No cervical or supraclavicular adenopathy Lungs no rales or rhonchi Heart regular rate and rhythm Abd soft, nontender, positive bowel sounds MSK no focal spinal tenderness, no upper extremity lymphedema Neuro: nonfocal, well oriented, appropriate affect Breasts: The right breast is status post mastectomy.  The wound on the right chest area was only partially uncovered but the base does indeed appear pink instead of yellow which is an improvement.   Right breast 05/06/2020    Right chest wall area 11/22/2019, which is the new baseline   LAB RESULTS: CMP Latest Ref Rng & Units 09/24/2020 09/10/2020 08/27/2020  Glucose 70 - 99 mg/dL 102(H) 90 96  BUN 6 - 20 mg/dL 7 9 8   Creatinine 0.44 - 1.00 mg/dL 0.72 0.75 0.76  Sodium 135 - 145 mmol/L 141 139 140  Potassium 3.5 - 5.1 mmol/L 3.6 3.7 3.8  Chloride 98 - 111 mmol/L 103 102 103  CO2 22 - 32 mmol/L 30 31 30   Calcium 8.9 - 10.3 mg/dL 9.7 9.8 9.7  Total Protein 6.5 - 8.1 g/dL 7.4 7.7 7.9  Total Bilirubin 0.3 - 1.2 mg/dL <0.2(L) 0.3 0.4  Alkaline Phos 38 - 126 U/L 87 78 69  AST 15 - 41 U/L 12(L) 16 12(L)  ALT 0 - 44 U/L 12 16 10     CBC    Component Value Date/Time   WBC 11.0 (H) 09/24/2020 1029   RBC 2.97 (L) 09/24/2020 1029   RBC 3.00 (L) 09/24/2020 1029   HGB 8.6 (L) 09/24/2020 1029   HGB 9.4 (L) 08/07/2020 1129   HGB 10.4 (L) 10/04/2016 1154   HCT 26.2 (L) 09/24/2020 1029   HCT 31.3 (L) 10/04/2016 1154   PLT 252 09/24/2020 1029   PLT 309 08/07/2020 1129   PLT 320 10/04/2016 1154   MCV 87.3 09/24/2020 1029  MCV 95.7 10/04/2016 1154   MCH 28.7 09/24/2020 1029   MCHC 32.8 09/24/2020 1029   RDW 18.0 (H) 09/24/2020 1029     RDW 16.3 (H) 10/04/2016 1154   LYMPHSABS 1.5 09/24/2020 1029   LYMPHSABS 1.7 10/04/2016 1154   MONOABS 0.9 09/24/2020 1029   MONOABS 0.5 10/04/2016 1154   EOSABS 0.1 09/24/2020 1029   EOSABS 0.3 10/04/2016 1154   BASOSABS 0.1 09/24/2020 1029   BASOSABS 0.0 10/04/2016 1154    STUDIES: No results found.   RESEARCH: Referred to PREVENT study, but was pregnant at the time; referred to [facility] 11202, but the biopsied lymph node was benign; referred to weight loss study but declined; referred to [facility] 12/09/2000, but the timing of radiation and 8 was greater than 60 days past the date of diagnosis; referred to health disparity study, but declined; referred to [id number] adjuvant therapy study, but the patient received Xeloda with radiation and therefore was ineligible   ASSESSMENT: 44 y.o. [place] woman status post right breast upper-outer quadrant biopsy 01/28/2016 for a clinical T2 N0 invasive ductal carcinoma, grade 3, triple negative, with an MIB-1 of 70%.  (a) suspicious right axillary lymph node biopsied 01/28/2016 was benign  (1) neoadjuvant chemotherapy: doxorubicin and cyclophosphamide in dose dense fashion 4 started 04/14/16, completed 05/26/2016, followed by paclitaxel and carboplatin weekly 12, Started 06/09/2016  (a) taxol discontinued after 7 doses because of neuropathy, last dose 07/21/2016  (2) genetics testing October 20, 2016 through the 32-gene Comprehensive Cancer Panel offered by [facility] Junius Roads, MD) (with MSH2 Exons 1-7 Inversion Analysis) found no deleterious mutations or VUSS  In APC, ATM, AXIN2, BARD1, BMPR1A, BRCA1, BRCA2, BRIP1, CDH1, CDK4, CDKN2A, CHEK2, EPCAM, FANCC, MLH1, MSH2, MSH6, MUTYH, NBN, PALB2, PMS2, POLD1, POLE, PTEN, RAD51C, RAD51D, SCG5/GREM1, SMAD4, STK11, TP53, VHL, and XRCC2.    (3) right mastectomy and sentinel lymph node sampling 09/06/2016 showed a residual ypT1c ypN0, invasive ductal carcinoma, grade 3, with negative  margins. Repeat prognostic panel again triple negative   (4) adjuvant radiation with capecitabine/Xeloda sensitization 11/15/16 - 01/12/17 Site/dose:   Right Chest Wall and axilla (4 field) treated to 45 Gy in 25 fractions, and then Boosted an additional 14.4 Gy in 8 fractions.  (5) tobacco abuse disorder: The patient quit smoking 04/04/2018  METASTATIC DISEASE:  July 2020: chest wall, bones, nodes (6) nonspecific changes noted on chest CT 06/11/2019 were clarified by PET scan 06/19/2019 showing hypermetabolic disease in the right anterior chest wall, right internal mammary nodes, right and left axillary nodes, but no metastatic disease in the neck, lungs, abdomen or pelvis.  Bone marrow uptake suggests bony metastatic disease.  (a) CARIS requested obtained from 06/28/2019 sample confirmed a triple negativity, the tumor also was negative for the androgen receptor, was MSI stable and mismatch repair status proficient, with a low mutational burden.  BRCA 1 and 2 were negative and PD-L1 was negative.  PI K3 showed a variant of uncertain significance.  However the tumor was genomic LOH high  (b) CA-27-29 is informative: was 118.3 on 06/04/2019  (7) zoledronate started 07/17/2019--on hold currently due to dental issues, and until 6 weeks at least after dental work  (8) carboplatin/ gemcitabine days 1 and 8 Q21 day cycle started 07/10/2019, last dose 10/30/2019  (a) restaging studies 09/2019 showed no progression  (b) restaging studies after 6 cycles showed mild disease progression  (9) cyclophosphamide, methotrexate, fluorouracil chemotherapy started 11/13/2019, repeated every 21 days.  (a) discontinued after cycle 3 (12/31/2019): No evidence  of response  (10) started capecitabine 01/21/2020, given 1 week on and 1 week off at standard doses (1500 mg twice daily)   (a) Discontinued after almost three months of treatment (last on 04/06/2020)  (b) Disease progression on 4/27 CT scan showing increasing  right chest wall disease, left breast lesions, and ? Liver involvement.  (c) MRI liver 04/14/2020 shows no liver invovlement  (11) Started liposomal doxorubicin/Doxil given on day 1 of a 21 day cycle on 04/07/2020  (a) echo on 04/02/2020 shows EF of 50-55%, repeat in 06/2020  (b) chest CT scan after 4 cycles of Doxil shows some evidence of progression in left axillary and right internal mammary adenopathy  (c) Doxil discontinued after 06/09/2020 dose  (12) started sacituzumab govitecan/ Trodelvy 07/10/2020  (a) day 8 cycle 2 omitted due to febrile neutropenia requiring admission  (b) changed to every two week dosing with Udenyca on day 3.    PLAN: Akita is tolerating the Jauca well.  It is difficult to tell whether it is working and we are going to obtain a CT scan of the chest before her next treatment here which will be 10/08/2020.  Of course I will see her that day to discuss results.  If the [name] is not working we have other options including cisplatin, navelbine, ixabepilone and eribulin  She knows to call for any other issue that may develop before the next visit  Total encounter time 25 minutes.Sarajane Jews C. Gelsey Amyx, MD 09/25/20 3:43 PM Medical Oncology and Hematology [facility] [place], [place] [address] Tel. [phone]    Fax. [phone]   I, Wilburn Mylar, am acting as scribe for Dr. [name]. Cele Mote.  I, Lurline Del MD, have reviewed the above documentation for accuracy and completeness, and I agree with the above.   *Total Encounter Time as defined by the Centers for Medicare and Medicaid Services includes, in addition to the face-to-face time of a patient visit (documented in the note above) non-face-to-face time: obtaining and reviewing outside history, ordering and reviewing medications, tests or procedures, care coordination (communications with other health care professionals or caregivers) and documentation in the  medical record.

## 2020-09-24 NOTE — Patient Instructions (Signed)
[facility] Discharge Instructions for Patients Receiving Chemotherapy  Today you received the following chemotherapy agents: Trodelvy.  To help prevent nausea and vomiting after your treatment, we encourage you to take your nausea medication as directed.   If you develop nausea and vomiting that is not controlled by your nausea medication, call the clinic.   BELOW ARE SYMPTOMS THAT SHOULD BE REPORTED IMMEDIATELY:  *FEVER GREATER THAN 100.5 F  *CHILLS WITH OR WITHOUT FEVER  NAUSEA AND VOMITING THAT IS NOT CONTROLLED WITH YOUR NAUSEA MEDICATION  *UNUSUAL SHORTNESS OF BREATH  *UNUSUAL BRUISING OR BLEEDING  TENDERNESS IN MOUTH AND THROAT WITH OR WITHOUT PRESENCE OF ULCERS  *URINARY PROBLEMS  *BOWEL PROBLEMS  UNUSUAL RASH Items with * indicate a potential emergency and should be followed up as soon as possible.  Feel free to call the clinic should you have any questions or concerns. The clinic phone number is [phone].  Please show the [facility] at check-in to the Emergency Department and triage nurse.  Blood Transfusion, Adult A blood transfusion is a procedure in which you receive blood or a type of blood cell (blood component) through an IV. You may need a blood transfusion when your blood level is low. This may result from a bleeding disorder, illness, injury, or surgery. The blood may come from a donor. You may also be able to donate blood for yourself (autologous blood donation) before a planned surgery. The blood given in a transfusion is made up of different blood components. You may receive:  Red blood cells. These carry oxygen to the cells in the body.  Platelets. These help your blood to clot.  Plasma. This is the liquid part of your blood. It carries proteins and other substances throughout the body.  White blood cells. These help you fight infections. If you have hemophilia or another clotting disorder, you may also receive other types  of blood products. Tell a health care provider about:  Any blood disorders you have.  Any previous reactions you have had during a blood transfusion.  Any allergies you have.  All medicines you are taking, including vitamins, herbs, eye drops, creams, and over-the-counter medicines.  Any surgeries you have had.  Any medical conditions you have, including any recent fever or cold symptoms.  Whether you are pregnant or may be pregnant. What are the risks? Generally, this is a safe procedure. However, problems may occur.  The most common problems include: ? A mild allergic reaction, such as red, swollen areas of skin (hives) and itching. ? Fever or chills. This may be the body's response to new blood cells received. This may occur during or up to 4 hours after the transfusion.  More serious problems may include: ? Transfusion-associated circulatory overload (TACO), or too much fluid in the lungs. This may cause breathing problems. ? A serious allergic reaction, such as difficulty breathing or swelling around the face and lips. ? Transfusion-related acute lung injury (TRALI), which causes breathing difficulty and low oxygen in the blood. This can occur within hours of the transfusion or several days later. ? Iron overload. This can happen after receiving many blood transfusions over a period of time. ? Infection or virus being transmitted. This is rare because donated blood is carefully tested before it is given. ? Hemolytic transfusion reaction. This is rare. It happens when your body's defense system (immune system)tries to attack the new blood cells. Symptoms may include fever, chills, nausea, low blood pressure, and low  back or chest pain. ? Transfusion-associated graft-versus-host disease (TAGVHD). This is rare. It happens when donated cells attack your body's healthy tissues. What happens before the procedure? Medicines Ask your health care provider about:  Changing or stopping  your regular medicines. This is especially important if you are taking diabetes medicines or blood thinners.  Taking medicines such as aspirin and ibuprofen. These medicines can thin your blood. Do not take these medicines unless your health care provider tells you to take them.  Taking over-the-counter medicines, vitamins, herbs, and supplements. General instructions  Follow instructions from your health care provider about eating and drinking restrictions.  You will have a blood test to determine your blood type. This is necessary to know what kind of blood your body will accept and to match it to the donor blood.  If you are going to have a planned surgery, you may be able to do an autologous blood donation. This may be done in case you need to have a transfusion.  You will have your temperature, blood pressure, and pulse monitored before the transfusion.  If you have had an allergic reaction to a transfusion in the past, you may be given medicine to help prevent a reaction. This medicine may be given to you by mouth (orally) or through an IV.  Set aside time for the blood transfusion. This procedure generally takes 1-4 hours to complete. What happens during the procedure?   An IV will be inserted into one of your veins.  The bag of donated blood will be attached to your IV. The blood will then enter through your vein.  Your temperature, blood pressure, and pulse will be monitored regularly during the transfusion. This monitoring is done to detect early signs of a transfusion reaction.  Tell your nurse right away if you have any of these symptoms during the transfusion: ? Shortness of breath or trouble breathing. ? Chest or back pain. ? Fever or chills. ? Hives or itching.  If you have any signs or symptoms of a reaction, your transfusion will be stopped and you may be given medicine.  When the transfusion is complete, your IV will be removed.  Pressure may be applied to the  IV site for a few minutes.  A bandage (dressing)will be applied. The procedure may vary among health care providers and hospitals. What happens after the procedure?  Your temperature, blood pressure, pulse, breathing rate, and blood oxygen level will be monitored until you leave the hospital or clinic.  Your blood may be tested to see how you are responding to the transfusion.  You may be warmed with fluids or blankets to maintain a normal body temperature.  If you receive your blood transfusion in an outpatient setting, you will be told whom to contact to report any reactions. Where to find more information For more information on blood transfusions, visit the American Red Cross: redcross.org Summary  A blood transfusion is a procedure in which you receive blood or a type of blood cell (blood component) through an IV.  The blood you receive may come from a donor or be donated by yourself (autologous blood donation) before a planned surgery.  The blood given in a transfusion is made up of different blood components. You may receive red blood cells, platelets, plasma, or white blood cells depending on the condition treated.  Your temperature, blood pressure, and pulse will be monitored before, during, and after the transfusion.  After the transfusion, your blood may be tested  to see how your body has responded. This information is not intended to replace advice given to you by your health care provider. Make sure you discuss any questions you have with your health care provider. Document Revised: 05/16/2019 Document Reviewed: 05/16/2019 Elsevier Patient Education  [place].

## 2020-09-25 LAB — CANCER ANTIGEN 27.29: CA 27.29: 246.1 U/mL — ABNORMAL HIGH (ref 0.0–38.6)

## 2020-09-26 ENCOUNTER — Inpatient Hospital Stay: Payer: Medicaid Other

## 2020-09-26 ENCOUNTER — Other Ambulatory Visit: Payer: Self-pay

## 2020-09-26 VITALS — BP 108/75 | HR 89 | Temp 97.1°F | Resp 18

## 2020-09-26 DIAGNOSIS — C50919 Malignant neoplasm of unspecified site of unspecified female breast: Secondary | ICD-10-CM

## 2020-09-26 DIAGNOSIS — Z5112 Encounter for antineoplastic immunotherapy: Secondary | ICD-10-CM | POA: Diagnosis not present

## 2020-09-26 DIAGNOSIS — C7951 Secondary malignant neoplasm of bone: Secondary | ICD-10-CM

## 2020-09-26 DIAGNOSIS — C50411 Malignant neoplasm of upper-outer quadrant of right female breast: Secondary | ICD-10-CM

## 2020-09-26 DIAGNOSIS — Z171 Estrogen receptor negative status [ER-]: Secondary | ICD-10-CM

## 2020-09-26 DIAGNOSIS — Z7189 Other specified counseling: Secondary | ICD-10-CM

## 2020-09-26 MED ORDER — PEGFILGRASTIM-CBQV 6 MG/0.6ML ~~LOC~~ SOSY
6.0000 mg | PREFILLED_SYRINGE | Freq: Once | SUBCUTANEOUS | Status: AC
  Administered 2020-09-26: 6 mg via SUBCUTANEOUS

## 2020-10-06 ENCOUNTER — Other Ambulatory Visit: Payer: Self-pay

## 2020-10-06 ENCOUNTER — Ambulatory Visit (HOSPITAL_COMMUNITY)
Admission: RE | Admit: 2020-10-06 | Discharge: 2020-10-06 | Disposition: A | Discharge status: Home / Self Care | Payer: Medicaid Other | Source: Ambulatory Visit | Attending: Oncology | Admitting: Oncology

## 2020-10-06 DIAGNOSIS — Z5111 Encounter for antineoplastic chemotherapy: Secondary | ICD-10-CM | POA: Diagnosis not present

## 2020-10-06 DIAGNOSIS — C50919 Malignant neoplasm of unspecified site of unspecified female breast: Secondary | ICD-10-CM | POA: Diagnosis not present

## 2020-10-06 DIAGNOSIS — C50411 Malignant neoplasm of upper-outer quadrant of right female breast: Secondary | ICD-10-CM | POA: Diagnosis not present

## 2020-10-06 DIAGNOSIS — N6310 Unspecified lump in the right breast, unspecified quadrant: Secondary | ICD-10-CM | POA: Diagnosis not present

## 2020-10-06 DIAGNOSIS — J984 Other disorders of lung: Secondary | ICD-10-CM | POA: Diagnosis not present

## 2020-10-06 DIAGNOSIS — Z171 Estrogen receptor negative status [ER-]: Secondary | ICD-10-CM | POA: Diagnosis present

## 2020-10-06 MED ORDER — IOHEXOL 300 MG/ML  SOLN
75.0000 mL | Freq: Once | INTRAMUSCULAR | Status: AC | PRN
  Administered 2020-10-06: 75 mL via INTRAVENOUS

## 2020-10-08 ENCOUNTER — Inpatient Hospital Stay: Payer: Medicaid Other | Attending: Oncology

## 2020-10-08 ENCOUNTER — Inpatient Hospital Stay: Payer: Medicaid Other

## 2020-10-08 ENCOUNTER — Inpatient Hospital Stay (HOSPITAL_BASED_OUTPATIENT_CLINIC_OR_DEPARTMENT_OTHER): Payer: Medicaid Other | Admitting: Oncology

## 2020-10-08 ENCOUNTER — Other Ambulatory Visit: Payer: Self-pay

## 2020-10-08 VITALS — BP 128/87 | HR 104 | Temp 99.4°F | Resp 18 | Ht 60.0 in | Wt 183.9 lb

## 2020-10-08 DIAGNOSIS — C7951 Secondary malignant neoplasm of bone: Secondary | ICD-10-CM | POA: Diagnosis not present

## 2020-10-08 DIAGNOSIS — C50411 Malignant neoplasm of upper-outer quadrant of right female breast: Secondary | ICD-10-CM | POA: Diagnosis not present

## 2020-10-08 DIAGNOSIS — K219 Gastro-esophageal reflux disease without esophagitis: Secondary | ICD-10-CM | POA: Diagnosis not present

## 2020-10-08 DIAGNOSIS — Z7189 Other specified counseling: Secondary | ICD-10-CM | POA: Diagnosis not present

## 2020-10-08 DIAGNOSIS — C50919 Malignant neoplasm of unspecified site of unspecified female breast: Secondary | ICD-10-CM

## 2020-10-08 DIAGNOSIS — Z5112 Encounter for antineoplastic immunotherapy: Secondary | ICD-10-CM | POA: Diagnosis not present

## 2020-10-08 DIAGNOSIS — F418 Other specified anxiety disorders: Secondary | ICD-10-CM | POA: Insufficient documentation

## 2020-10-08 DIAGNOSIS — I10 Essential (primary) hypertension: Secondary | ICD-10-CM | POA: Insufficient documentation

## 2020-10-08 DIAGNOSIS — E119 Type 2 diabetes mellitus without complications: Secondary | ICD-10-CM | POA: Insufficient documentation

## 2020-10-08 DIAGNOSIS — Z171 Estrogen receptor negative status [ER-]: Secondary | ICD-10-CM | POA: Diagnosis not present

## 2020-10-08 DIAGNOSIS — D573 Sickle-cell trait: Secondary | ICD-10-CM | POA: Diagnosis not present

## 2020-10-08 DIAGNOSIS — Z6841 Body Mass Index (BMI) 40.0 and over, adult: Secondary | ICD-10-CM | POA: Diagnosis not present

## 2020-10-08 DIAGNOSIS — C50412 Malignant neoplasm of upper-outer quadrant of left female breast: Secondary | ICD-10-CM | POA: Diagnosis not present

## 2020-10-08 DIAGNOSIS — T451X5A Adverse effect of antineoplastic and immunosuppressive drugs, initial encounter: Secondary | ICD-10-CM

## 2020-10-08 DIAGNOSIS — Z9011 Acquired absence of right breast and nipple: Secondary | ICD-10-CM | POA: Diagnosis not present

## 2020-10-08 DIAGNOSIS — K5900 Constipation, unspecified: Secondary | ICD-10-CM | POA: Insufficient documentation

## 2020-10-08 DIAGNOSIS — Z923 Personal history of irradiation: Secondary | ICD-10-CM | POA: Insufficient documentation

## 2020-10-08 DIAGNOSIS — I509 Heart failure, unspecified: Secondary | ICD-10-CM | POA: Diagnosis not present

## 2020-10-08 DIAGNOSIS — G620 Drug-induced polyneuropathy: Secondary | ICD-10-CM | POA: Diagnosis not present

## 2020-10-08 DIAGNOSIS — Z87891 Personal history of nicotine dependence: Secondary | ICD-10-CM | POA: Insufficient documentation

## 2020-10-08 LAB — CBC WITH DIFFERENTIAL/PLATELET
Abs Immature Granulocytes: 0.11 10*3/uL — ABNORMAL HIGH (ref 0.00–0.07)
Basophils Absolute: 0.1 10*3/uL (ref 0.0–0.1)
Basophils Relative: 1 %
Eosinophils Absolute: 0.1 10*3/uL (ref 0.0–0.5)
Eosinophils Relative: 1 %
HCT: 24.7 % — ABNORMAL LOW (ref 36.0–46.0)
Hemoglobin: 8.3 g/dL — ABNORMAL LOW (ref 12.0–15.0)
Immature Granulocytes: 1 %
Lymphocytes Relative: 10 %
Lymphs Abs: 1.5 10*3/uL (ref 0.7–4.0)
MCH: 29.1 pg (ref 26.0–34.0)
MCHC: 33.6 g/dL (ref 30.0–36.0)
MCV: 86.7 fL (ref 80.0–100.0)
Monocytes Absolute: 1.1 10*3/uL — ABNORMAL HIGH (ref 0.1–1.0)
Monocytes Relative: 7 %
Neutro Abs: 11.9 10*3/uL — ABNORMAL HIGH (ref 1.7–7.7)
Neutrophils Relative %: 80 %
Platelets: 262 10*3/uL (ref 150–400)
RBC: 2.85 MIL/uL — ABNORMAL LOW (ref 3.87–5.11)
RDW: 18.1 % — ABNORMAL HIGH (ref 11.5–15.5)
WBC: 14.8 10*3/uL — ABNORMAL HIGH (ref 4.0–10.5)
nRBC: 0 % (ref 0.0–0.2)

## 2020-10-08 LAB — COMPREHENSIVE METABOLIC PANEL
ALT: 13 U/L (ref 0–44)
AST: 14 U/L — ABNORMAL LOW (ref 15–41)
Albumin: 3.5 g/dL (ref 3.5–5.0)
Alkaline Phosphatase: 95 U/L (ref 38–126)
Anion gap: 7 (ref 5–15)
BUN: 8 mg/dL (ref 6–20)
CO2: 30 mmol/L (ref 22–32)
Calcium: 9.2 mg/dL (ref 8.9–10.3)
Chloride: 104 mmol/L (ref 98–111)
Creatinine, Ser: 0.69 mg/dL (ref 0.44–1.00)
GFR, Estimated: >60 mL/min (ref >60)
Glucose, Bld: 91 mg/dL (ref 70–99)
Potassium: 3.5 mmol/L (ref 3.5–5.1)
Sodium: 141 mmol/L (ref 135–145)
Total Bilirubin: 0.3 mg/dL (ref 0.3–1.2)
Total Protein: 7.4 g/dL (ref 6.5–8.1)

## 2020-10-08 MED ORDER — LORAZEPAM 0.5 MG PO TABS: 0.5000 mg | ORAL_TABLET | Freq: Every day | ORAL | 0 refills | Status: DC | PRN

## 2020-10-08 MED ORDER — SODIUM CHLORIDE 0.9% FLUSH
10.0000 mL | INTRAVENOUS | Status: DC | PRN
  Administered 2020-10-08: 10 mL
  Filled 2020-10-08: qty 10

## 2020-10-08 MED ORDER — OXYCODONE HCL 5 MG PO TABS
5.0000 mg | ORAL_TABLET | Freq: Three times a day (TID) | ORAL | 0 refills | Status: DC | PRN
Start: 2020-10-08 — End: 2020-11-11

## 2020-10-08 NOTE — Patient Instructions (Signed)

## 2020-10-08 NOTE — Progress Notes (Signed)
[place]  [phone] [phone]) [phone]   ID: [name] DOB: 1976-01-27  MR#: [id number]  RXV#:[id number]  Patient Care Team: Dorena Dew, FNP as PCP - General (Family Medicine) Alphonsa Overall, MD as Consulting Physician (General Surgery) Mazel Villela, [name], MD as Consulting Physician (Oncology) Gery Pray, MD as Consulting Physician (Radiation Oncology) Delice Bison, Charlestine Massed, NP as Nurse Practitioner (Hematology and Oncology) Alda Berthold, DO as Consulting Physician (Neurology) OTHER MD:  CHIEF COMPLAINT: triple negative stage IV breast cancer  CURRENT TREATMENT: Navelbine   INTERVAL HISTORY: Rebecca [name] returns today for follow up and treatment of her triple negative metastatic breast cancer  She has been treated most recently with [name],  given every 2 weeks, with Udenyca on day 3. Today would be day 1 cycle 5.  She tolerated these treatments moderately well, but since her last visit, she underwent restaging chest CT on 10/06/2020 showing: worsening adenopathy, with enlarging cutaneous and subcutaneous masses, and enlarging right pectoralis muscle; subtle demineralization of right 4th and 5th ribs, probably reactive or radiation therapy-related; mild enlargement of a right hilar lymph nodes, significance uncertain; severe thinning of soft tissues anterior to rib cage along lower right chest.  We have also been following her CA 27-29: Results for Rebecca, [name] (MRN [id number]) as of 10/08/2020 10:36  Ref. Range 06/09/2020 13:39 07/10/2020 09:18 07/31/2020 09:20 08/27/2020 08:53 09/24/2020 10:29  CA 27.29 Latest Ref Range: 0.0 - 38.6 U/mL 407.5 (H) 324.1 (H) 290.5 (H) 238.4 (H) 246.1 (H)    REVIEW OF SYSTEMS: [name] tells me she lives "day by day".  She has significant pain.  She is controlling that currently with oxycodone which she may take up to 3 times daily.  She also takes lorazepam most of the time once a day.  She does have  constipation from these treatments.  She uses MiraLAX and stool softeners as needed.  She is doing her best to keep her wound clean and dry and to keep the face of the wound "pink instead of yellow".  She has had no unusual headaches visual changes nausea or vomiting.  She denies cough phlegm production or pleurisy.  Detailed review of systems was otherwise stable.   COVID 19 VACCINATION STATUS: She received Sport and exercise psychologist.    BREAST CANCER HISTORY: From the original intake note:  [name] herself noted a change in her right breast sometime around September or October 2016. She did not bring it to intermediate medical attention, but on 01/13/2016 she established herself in Dr. Smith Robert' service and she was set up for bilateral diagnostic mammography with tomosynthesis and bilateral ultrasonography at the [facility] 01/19/2016. The breast density was category B. In the upper outer quadrant of the right breast there was a spiculated mass measuring 2.8 cm. On physical exam this was palpable. Targeted ultrasonography confirmed an irregular hypoechoic mass in the right breast 11:30 o'clock position measuring 2.6 cm maximally. Ultrasound of the right axilla showed a morphologically abnormal lymph node.  In the left breast there were some tubular densities behind the areola which by ultrasonography showed benign ductal ectasia.  On 01/28/2016 [place] underwent biopsy of the right breast mass and abnormal right axillary lymph node. The pathology from this procedure (S [id number]) showed the lymph node to be benign. In the breast however there was an invasive ductal carcinoma, grade 3, which was estrogen and progesterone receptor negative. The proliferation marker was 70%. HER-2 was not amplified with a signals ratio of 1.32. The  number per cell was 2.05.  The patient's subsequent history is as detailed below   PAST MEDICAL HISTORY: Past Medical History:  Diagnosis Date  . Anxiety   . Breast cancer ([place])   .  Depression   . GERD (gastroesophageal reflux disease)   . History of radiation therapy 11/15/16-01/12/17   right chest wall and axilla treated to 45 Gy in 25 fractions, boosted and additional 14 Gy in 8 fractions  . Hypertension    diet controlled  . Obesity (BMI 35.0-39.9 without comorbidity)   . Pneumonia    as a child  . Seasonal allergies   . Sickle cell trait ([place])   . Termination of pregnancy (fetus) 04/02/16    PAST SURGICAL HISTORY: Past Surgical History:  Procedure Laterality Date  . CESAREAN SECTION     2004 and 2007  . MASTECTOMY W/ SENTINEL NODE BIOPSY Right 09/06/2016   Procedure: RIGHT BREAST MASTECTOMY WITH RIGHT AXILLARY SENTINEL LYMPH NODE BIOPSY;  Surgeon: Alphonsa Overall, MD;  Location: [facility];  Service: General;  Laterality: Right;  . PORT-A-CATH REMOVAL Left 09/06/2016   Procedure: REMOVAL PORT-A-CATH;  Surgeon: Alphonsa Overall, MD;  Location: [facility];  Service: General;  Laterality: Left;  . PORTACATH PLACEMENT    . PORTACATH PLACEMENT N/A 07/09/2019   Procedure: INSERTION PORT-A-CATH WITH ULTRASOUND;  Surgeon: Alphonsa Overall, MD;  Location[facility];  Service: General;  Laterality: N/A;    FAMILY HISTORY Family History  Problem Relation Age of Onset  . Hypertension Mother   . Cancer Mother        dx "intestinal cancer" in her 37s; +surgery  . Other Mother        hysterectomy at young age for unspecified cause  . Heart Problems Mother   . Breast cancer Cousin        maternal 1st cousin dx female breast cancer at 7-46y  . Cancer Father   . Hypertension Father   . Heart Problems Maternal Aunt   . Diabetes Maternal Aunt   . Breast cancer Maternal Uncle        dx 64-65  . Heart Problems Maternal Uncle   . Breast cancer Maternal Grandmother 2  . Throat cancer Maternal Grandfather        d. 27s; smoker  . Sickle cell anemia Paternal Aunt   . Congestive Heart Failure Maternal Aunt   . Multiple sclerosis Cousin   . Cancer Other         maternal great uncle (MGM's brother); cancer removed from his side  . Heart attack Paternal Aunt        d. early 39s  The patient has very little information about her father. Her mother is currently 77 years old. She had a history of cervical cancer at age 84. The patient had 2 brothers, no sisters. The maternal grandfather had throat cancer. A maternal uncle was diagnosed with breast cancer as well as prostate cancer at the age of 57. 2 maternal cousins, one of them female, had breast cancer as well.   GYNECOLOGIC HISTORY:  No LMP recorded. Menarche age 71, first live birth age 28. The patient is GX P4. She was still having regular periods at the time of diagnosis. She took oral contraceptives in the 1990s with no side effects.--.  Stopped with chemotherapy and have not resumed as of May 2019   SOCIAL HISTORY:  (Updated               )  She works on and off as a Market researcher. The patient's significant other [name] works at [employer] and [employer].  At home with the patient are her 3 children [name], Colton and [place]per [place]. There are age 91, 24, and 62 as of November 2019.  2 of them are disabled or have significant health problems, one with sickle cell disease, the other with autism and developmental delay.  The patient's son [name], currently 60 years old, lives in [place].    ADVANCED DIRECTIVES: Not in place   HEALTH MAINTENANCE: Social History   Tobacco Use  . Smoking status: Former Smoker    Packs/day: 1.00    Years: 20.00    Pack years: 20.00    Types: Cigarettes    Quit date: 04/01/2018    Years since quitting: 2.5  . Smokeless tobacco: Never Used  . Tobacco comment: Patient has quit smoking x 1 year now  Vaping Use  . Vaping Use: Never used  Substance Use Topics  . Alcohol use: Yes    Comment: occ  . Drug use: No    Colonoscopy:  PAP:  Bone density:  Lipid panel:  No Known Allergies  Current Outpatient Medications on File  Prior to Visit  Medication Sig Dispense Refill  . calcium carbonate (TUMS - DOSED IN MG ELEMENTAL CALCIUM) 500 MG chewable tablet Chew 1 tablet by mouth daily as needed for indigestion or heartburn.    . polyethylene glycol (MIRALAX / GLYCOLAX) 17 g packet Take 17 g by mouth daily. 14 each 0   No current facility-administered medications on file prior to visit.    OBJECTIVE: African-American woman who appears stated age  21:   10/08/20 1148  BP: 128/87  Pulse: (!) 104  Resp: 18  Temp: 99.4 F (37.4 C)  SpO2: 100%   Wt Readings from Last 3 Encounters:  10/08/20 183 lb 14.4 oz (83.4 kg)  09/24/20 186 lb 4.8 oz (84.5 kg)  09/10/20 187 lb 1.6 oz (84.9 kg)   Body mass index is 35.92 kg/m.    ECOG FS: 1  Sclerae unicteric, EOMs intact Wearing a mask Lungs no rales or rhonchi Heart regular rate and rhythm Abd soft, nontender, positive bowel sounds MSK no focal spinal tenderness, no upper extremity lymphedema Neuro: nonfocal, well oriented, appropriate affect Breasts: The right breast is status post mastectomy.  The chest wall wound was uncovered today.  It measures approximately 5 cm, is approximately a third of a centimeter deep, and a mostly pink base with some yellow.    Right breast 05/06/2020    Right chest wall area 11/22/2019, which is the new baseline   LAB RESULTS: CMP Latest Ref Rng & Units 10/08/2020 09/24/2020 09/10/2020  Glucose 70 - 99 mg/dL 91 102(H) 90  BUN 6 - 20 mg/dL _0 Creatinine 0.44 - 1.00 mg/dL 0.69 0.72 0.75  Sodium 135 - 145 mmol/L 141 141 139  Potassium 3.5 - 5.1 mmol/L 3.5 3.6 3.7  Chloride 98 - 111 mmol/L 104 103 102  CO2 22 - 32 mmol/L _1 Calcium 8.9 - 10.3 mg/dL 9.2 9.7 9.8  Total Protein 6.5 - 8.1 g/dL 7.4 7.4 7.7  Total Bilirubin 0.3 - 1.2 mg/dL 0.3 <0.2(L) 0.3  Alkaline Phos 38 - 126 U/L 95 87 78  AST 15 - 41 U/L 14(L) 12(L) 16  ALT 0 - 44 U/L _2 CBC    Component Value Date/Time  WBC 14.8 (H) 10/08/2020  1135   RBC 2.85 (L) 10/08/2020 1135   HGB 8.3 (L) 10/08/2020 1135   HGB 9.4 (L) 08/07/2020 1129   HGB 10.4 (L) 10/04/2016 1154   HCT 24.7 (L) 10/08/2020 1135   HCT 31.3 (L) 10/04/2016 1154   PLT 262 10/08/2020 1135   PLT 309 08/07/2020 1129   PLT 320 10/04/2016 1154   MCV 86.7 10/08/2020 1135   MCV 95.7 10/04/2016 1154   MCH 29.1 10/08/2020 1135   MCHC 33.6 10/08/2020 1135   RDW 18.1 (H) 10/08/2020 1135   RDW 16.3 (H) 10/04/2016 1154   LYMPHSABS 1.5 10/08/2020 1135   LYMPHSABS 1.7 10/04/2016 1154   MONOABS 1.1 (H) 10/08/2020 1135   MONOABS 0.5 10/04/2016 1154   EOSABS 0.1 10/08/2020 1135   EOSABS 0.3 10/04/2016 1154   BASOSABS 0.1 10/08/2020 1135   BASOSABS 0.0 10/04/2016 1154    STUDIES: CT Chest W Contrast  Result Date: 10/06/2020 CLINICAL DATA:  Breast cancer restaging.  Chemotherapy in progress. EXAM: CT CHEST WITH CONTRAST TECHNIQUE: Multidetector CT imaging of the chest was performed during intravenous contrast administration. CONTRAST:  20mL OMNIPAQUE IOHEXOL 300 MG/ML  SOLN COMPARISON:  08/04/2020 FINDINGS: Cardiovascular: Left Port-A-Cath tip: Right atrium. Mediastinum/Nodes: Irregular and low-density left axillary adenopathy extending towards the cutaneous surface and measuring up to 2.7 cm in short axis on image 56 of series 2, previously 2.3 cm. The field of view of today's CT includes more of the left breast which was excluded on prior imaging, although not the entirety of the breast. There is cutaneous and subcutaneous nodularity along the left upper breast including a 1.5 by 1.0 cm cutaneous nodule on image 68 of series 2 and an adjacent subcutaneous mass measuring 1.8 by 1.7 cm on image 69 of series 2. Pathologic left subcarinal adenopathy is also appreciable, similar to prior. Right internal mammary adenopathy, 0.8 cm in short axis on image 50 of series 2, previously measured at 0.8 cm. Right axillary and subpectoral lymph nodes including a 1.5 cm right axillary node on  image 65 of series 2 (previously 0.8 cm). Tumor deposits are present along the right pectoralis muscle, including a 1.7 by 2.7 cm nodule along the superficial fascia margin on image 67 of series 2, previously 2.0 by 1.0 cm. Moreover, moreover, the right pectoralis musculature is notably thicker on the left, measuring 2.6 cm in short axis, whereas the left side is 1.2 cm. Large defect of the right inferior breast likely postoperative. Cutaneous thickening along the upper margin of this presumed surgical defect. Severe thinning of the soft tissues anterior to the rib cage along the lower right chest. Right hilar node 1.3 cm in short axis on image fifty-seven of series 2, previously 1.0 cm by my measurements. Lungs/Pleura: Stable scarring in the right middle lobe. Stable scarring along the left hemidiaphragm. Upper Abdomen: Unremarkable Musculoskeletal: Thickened right pectoralis musculature is noted above. Subtle demineralization of the right anterior fourth and fifth ribs in the region of overlying soft tissue thinning, probably reactive although a component of radiation therapy related findings is a possibility. Skeptical that this is due to tumor invasion of the ribs. IMPRESSION: 1. Worsening bilateral axillary, subpectoral, and right internal mammary adenopathy, with enlarging cutaneous and subcutaneous masses along the left upper breast, right pectoralis muscle, and enlarging right pectoralis muscle masses. 2. Subtle demineralization of the right anterior fourth and fifth ribs in the region of overlying soft tissue thinning, probably reactive although a component of radiation therapy  related findings is a possibility. 3. Mild enlargement of a right hilar lymph node, significance uncertain. 4. Left Port-A-Cath tip is in the right atrium. 5. Severe thinning of the soft tissues anterior to the rib cage along the lower right chest. Electronically Signed   By: Van Clines M.D.   On: 10/06/2020 09:33      RESEARCH: Referred to PREVENT study, but was pregnant at the time; referred to [facility] 11202, but the biopsied lymph node was benign; referred to weight loss study but declined; referred to [facility] 12/09/2000, but the timing of radiation and 8 was greater than 60 days past the date of diagnosis; referred to health disparity study, but declined; referred to [id number] adjuvant therapy study, but the patient received Xeloda with radiation and therefore was ineligible   ASSESSMENT: 43 y.o. [place] woman status post right breast upper-outer quadrant biopsy 01/28/2016 for a clinical T2 N0 invasive ductal carcinoma, grade 3, triple negative, with an MIB-1 of 70%.  (a) suspicious right axillary lymph node biopsied 01/28/2016 was benign  (1) neoadjuvant chemotherapy: doxorubicin and cyclophosphamide in dose dense fashion 4 started 04/14/16, completed 05/26/2016, followed by paclitaxel and carboplatin weekly 12, Started 06/09/2016  (a) taxol discontinued after 7 doses because of neuropathy, last dose 07/21/2016  (2) genetics testing October 20, 2016 through the 32-gene Comprehensive Cancer Panel offered by [facility] Junius Roads, MD) (with MSH2 Exons 1-7 Inversion Analysis) found no deleterious mutations or VUSS  In APC, ATM, AXIN2, BARD1, BMPR1A, BRCA1, BRCA2, BRIP1, CDH1, CDK4, CDKN2A, CHEK2, EPCAM, FANCC, MLH1, MSH2, MSH6, MUTYH, NBN, PALB2, PMS2, POLD1, POLE, PTEN, RAD51C, RAD51D, SCG5/GREM1, SMAD4, STK11, TP53, VHL, and XRCC2.    (3) right mastectomy and sentinel lymph node sampling 09/06/2016 showed a residual ypT1c ypN0, invasive ductal carcinoma, grade 3, with negative margins. Repeat prognostic panel again triple negative   (4) adjuvant radiation with capecitabine/Xeloda sensitization 11/15/16 - 01/12/17 Site/dose:   Right Chest Wall and axilla (4 field) treated to 45 Gy in 25 fractions, and then Boosted an additional 14.4 Gy in 8 fractions.  (5) tobacco abuse disorder: The  patient quit smoking 04/04/2018  METASTATIC DISEASE:  July 2020: chest wall, bones, nodes (6) nonspecific changes noted on chest CT 06/11/2019 were clarified by PET scan 06/19/2019 showing hypermetabolic disease in the right anterior chest wall, right internal mammary nodes, right and left axillary nodes, but no metastatic disease in the neck, lungs, abdomen or pelvis.  Bone marrow uptake suggests bony metastatic disease.  (a) CARIS requested obtained from 06/28/2019 sample confirmed a triple negativity, the tumor also was negative for the androgen receptor, was MSI stable and mismatch repair status proficient, with a low mutational burden.  BRCA 1 and 2 were negative and PD-L1 was negative.  PIK3 showed a variant of uncertain significance.  However the tumor was genomic LOH high  (b) CA-27-29 is informative: was 118.3 on 06/04/2019  (7) zoledronate started 07/17/2019--on hold currently due to dental issues, and until 6 weeks at least after dental work  (8) carboplatin/ gemcitabine days 1 and 8 Q21 day cycle started 07/10/2019, last dose 10/30/2019  (a) restaging studies 09/2019 showed no progression  (b) restaging studies after 6 cycles showed mild disease progression  (9) cyclophosphamide, methotrexate, fluorouracil chemotherapy started 11/13/2019, repeated every 21 days.  (a) discontinued after cycle 3 (12/31/2019): No evidence of response  (10) started capecitabine 01/21/2020, given 1 week on and 1 week off at standard doses (1500 mg twice daily)   (a) Discontinued after almost three  months of treatment (last on 04/06/2020)  (b) Disease progression on 4/27 CT scan showing increasing right chest wall disease, left breast lesions, and ? Liver involvement.  (c) MRI liver 04/14/2020 shows no liver invovlement  (11) Started liposomal doxorubicin/Doxil given on day 1 of a 21 day cycle on 04/07/2020  (a) echo on 04/02/2020 shows EF of 50-55%, repeat in 06/2020  (b) chest CT scan after 4 cycles of Doxil  shows some evidence of progression in left axillary and right internal mammary adenopathy  (c) Doxil discontinued after 06/09/2020 dose  (12) started sacituzumab govitecan/ Trodelvy 07/10/2020  (a) day 8 cycle 2 omitted due to febrile neutropenia requiring admission  (b) changed to every two week dosing with Udenyca support after cycle 2  (c) discontinued after 09/24/2020 dose with evidence of disease progression  (13) to start Navelbine 10/15/2020, to be repeated days 1 and 8 of each 21-day cycle  (a) chest CT scan 10/06/2020 is new baseline study for measurable disease  PLAN: Genecis's scans shows evidence of disease progression.  She does not have immediately life-threatening disease, but the measurable adenopathy has increased despite the [place] and we are accordingly stopping that medication.  In addition her chest wound also appears to have progressed.  That is the chief cause of morbidity to her, with significant pain as the chief symptom.  I offered her a second opinion at [facility] or elsewhere but she really has no transportation and she does not feel she can get there.  We discussed additional treatments.  I think she would be able to tolerate Navelbine better than cisplatin which is the other choice we considered.  Both can cause neuropathy issues.  Navelbine can also cause problems with constipation.  She is already a bit constipated because of the pain meds.  Accordingly we reviewed bowel prophylaxis and she will be on daily MiraLAX and also take stool softeners 2 tablets twice daily, using a laxative day 3 if necessary.  We are going to start treatment 10/15/2020.  She will see me the following week, with the day 8 treatment, to assess tolerability.  The plan is to do this for about 3 months and restage  Total encounter time 35 minutes.Sarajane Jews C. Camdan Burdi, MD 10/08/20 5:17 PM Medical Oncology and Hematology [facility] [place], [place]  [address] Tel. [phone]    Fax. [phone]   I, Wilburn Mylar, am acting as scribe for Dr. [name]. Limuel Nieblas.  I, Lurline Del MD, have reviewed the above documentation for accuracy and completeness, and I agree with the above.   *Total Encounter Time as defined by the Centers for Medicare and Medicaid Services includes, in addition to the face-to-face time of a patient visit (documented in the note above) non-face-to-face time: obtaining and reviewing outside history, ordering and reviewing medications, tests or procedures, care coordination (communications with other health care professionals or caregivers) and documentation in the medical record.

## 2020-10-08 NOTE — Progress Notes (Signed)
DISCONTINUE ON PATHWAY REGIMEN - Breast     A cycle is every 21 days:     Sacituzumab govitecan-hziy   **Always confirm dose/schedule in your pharmacy ordering system**  REASON: Disease Progression PRIOR TREATMENT: [id number]: Sacituzumab Govitecan 10 mg/kg D1, 8 q21 Days TREATMENT RESPONSE: Progressive Disease (PD)  START ON PATHWAY REGIMEN - Breast     A cycle is every 21 days:     Vinorelbine   **Always confirm dose/schedule in your pharmacy ordering system**  Patient Characteristics: Distant Metastases or Locoregional Recurrent Disease - Unresected or Locally Advanced Unresectable Disease Progressing after Neoadjuvant and Local Therapies, HER2 Negative/Unknown/Equivocal, ER Negative/Unknown, Chemotherapy, Third Line and Beyond, Prior  or Contraindicated Anthracycline and Prior or Contraindicated Eribulin Therapeutic Status: Distant Metastases ER Status: Negative (-) HER2 Status: Negative (-) PR Status: Negative (-) Therapy Approach Indicated: Standard Chemotherapy/Endocrine Therapy Line of Therapy: Third Line and Beyond Intent of Therapy: Non-Curative / Palliative Intent, Discussed with Patient

## 2020-10-15 ENCOUNTER — Inpatient Hospital Stay: Payer: Medicaid Other

## 2020-10-15 ENCOUNTER — Telehealth: Payer: Self-pay | Admitting: *Deleted

## 2020-10-15 ENCOUNTER — Other Ambulatory Visit: Payer: Self-pay

## 2020-10-15 VITALS — BP 114/68 | HR 98 | Temp 98.2°F | Resp 16

## 2020-10-15 DIAGNOSIS — C7951 Secondary malignant neoplasm of bone: Secondary | ICD-10-CM

## 2020-10-15 DIAGNOSIS — C50411 Malignant neoplasm of upper-outer quadrant of right female breast: Secondary | ICD-10-CM

## 2020-10-15 DIAGNOSIS — C50919 Malignant neoplasm of unspecified site of unspecified female breast: Secondary | ICD-10-CM

## 2020-10-15 DIAGNOSIS — Z7189 Other specified counseling: Secondary | ICD-10-CM

## 2020-10-15 DIAGNOSIS — Z171 Estrogen receptor negative status [ER-]: Secondary | ICD-10-CM

## 2020-10-15 DIAGNOSIS — Z5112 Encounter for antineoplastic immunotherapy: Secondary | ICD-10-CM | POA: Diagnosis not present

## 2020-10-15 LAB — CBC WITH DIFFERENTIAL/PLATELET
Abs Immature Granulocytes: 0.06 10*3/uL (ref 0.00–0.07)
Basophils Absolute: 0.1 10*3/uL (ref 0.0–0.1)
Basophils Relative: 1 %
Eosinophils Absolute: 0.1 10*3/uL (ref 0.0–0.5)
Eosinophils Relative: 1 %
HCT: 27.2 % — ABNORMAL LOW (ref 36.0–46.0)
Hemoglobin: 9 g/dL — ABNORMAL LOW (ref 12.0–15.0)
Immature Granulocytes: 1 %
Lymphocytes Relative: 14 %
Lymphs Abs: 1.5 10*3/uL (ref 0.7–4.0)
MCH: 29.1 pg (ref 26.0–34.0)
MCHC: 33.1 g/dL (ref 30.0–36.0)
MCV: 88 fL (ref 80.0–100.0)
Monocytes Absolute: 1.2 10*3/uL — ABNORMAL HIGH (ref 0.1–1.0)
Monocytes Relative: 11 %
Neutro Abs: 7.7 10*3/uL (ref 1.7–7.7)
Neutrophils Relative %: 72 %
Platelets: 293 10*3/uL (ref 150–400)
RBC: 3.09 MIL/uL — ABNORMAL LOW (ref 3.87–5.11)
RDW: 18.2 % — ABNORMAL HIGH (ref 11.5–15.5)
WBC: 10.7 10*3/uL — ABNORMAL HIGH (ref 4.0–10.5)
nRBC: 0 % (ref 0.0–0.2)

## 2020-10-15 LAB — COMPREHENSIVE METABOLIC PANEL
ALT: 7 U/L (ref 0–44)
AST: 12 U/L — ABNORMAL LOW (ref 15–41)
Albumin: 3.7 g/dL (ref 3.5–5.0)
Alkaline Phosphatase: 75 U/L (ref 38–126)
Anion gap: 9 (ref 5–15)
BUN: 6 mg/dL (ref 6–20)
CO2: 28 mmol/L (ref 22–32)
Calcium: 9.7 mg/dL (ref 8.9–10.3)
Chloride: 105 mmol/L (ref 98–111)
Creatinine, Ser: 0.81 mg/dL (ref 0.44–1.00)
GFR, Estimated: >60 mL/min (ref >60)
Glucose, Bld: 105 mg/dL — ABNORMAL HIGH (ref 70–99)
Potassium: 3.9 mmol/L (ref 3.5–5.1)
Sodium: 142 mmol/L (ref 135–145)
Total Bilirubin: 0.4 mg/dL (ref 0.3–1.2)
Total Protein: 7.7 g/dL (ref 6.5–8.1)

## 2020-10-15 LAB — FERRITIN: Ferritin: 909 ng/mL — ABNORMAL HIGH (ref 11–307)

## 2020-10-15 LAB — RETICULOCYTES
Immature Retic Fract: 22.5 % — ABNORMAL HIGH (ref 2.3–15.9)
RBC.: 3.06 MIL/uL — ABNORMAL LOW (ref 3.87–5.11)
Retic Count, Absolute: 51.1 10*3/uL (ref 19.0–186.0)
Retic Ct Pct: 1.7 % (ref 0.4–3.1)

## 2020-10-15 MED ORDER — VINORELBINE TARTRATE CHEMO INJECTION 50 MG/5ML
25.0000 mg/m2 | Freq: Once | INTRAVENOUS | Status: AC
  Administered 2020-10-15: 47 mg via INTRAVENOUS
  Filled 2020-10-15: qty 4.7

## 2020-10-15 MED ORDER — HEPARIN SOD (PORK) LOCK FLUSH 100 UNIT/ML IV SOLN
500.0000 [IU] | Freq: Once | INTRAVENOUS | Status: AC | PRN
  Administered 2020-10-15: 500 [IU]
  Filled 2020-10-15: qty 5

## 2020-10-15 MED ORDER — SODIUM CHLORIDE 0.9% FLUSH
10.0000 mL | INTRAVENOUS | Status: DC | PRN
  Administered 2020-10-15: 10 mL
  Filled 2020-10-15: qty 10

## 2020-10-15 MED ORDER — PROCHLORPERAZINE MALEATE 10 MG PO TABS
10.0000 mg | ORAL_TABLET | Freq: Once | ORAL | Status: AC
  Administered 2020-10-15: 10 mg via ORAL

## 2020-10-15 MED ORDER — PROCHLORPERAZINE MALEATE 10 MG PO TABS
ORAL_TABLET | ORAL | Status: AC
  Filled 2020-10-15: qty 1

## 2020-10-15 MED ORDER — SODIUM CHLORIDE 0.9 % IV SOLN
Freq: Once | INTRAVENOUS | Status: AC
  Filled 2020-10-15: qty 250

## 2020-10-15 NOTE — Telephone Encounter (Signed)
Referral called to [facility]

## 2020-10-15 NOTE — Patient Instructions (Signed)
[facility] Discharge Instructions for Patients Receiving Chemotherapy  Today you received the following immunotherapy agent: Vinorelbine  To help prevent nausea and vomiting after your treatment, we encourage you to take your nausea medication as directed by your MD.   If you develop nausea and vomiting that is not controlled by your nausea medication, call the clinic.   BELOW ARE SYMPTOMS THAT SHOULD BE REPORTED IMMEDIATELY:  *FEVER GREATER THAN 100.5 F  *CHILLS WITH OR WITHOUT FEVER  NAUSEA AND VOMITING THAT IS NOT CONTROLLED WITH YOUR NAUSEA MEDICATION  *UNUSUAL SHORTNESS OF BREATH  *UNUSUAL BRUISING OR BLEEDING  TENDERNESS IN MOUTH AND THROAT WITH OR WITHOUT PRESENCE OF ULCERS  *URINARY PROBLEMS  *BOWEL PROBLEMS  UNUSUAL RASH Items with * indicate a potential emergency and should be followed up as soon as possible.  Feel free to call the clinic should you have any questions or concerns. The clinic phone number is [phone].  Please show the [facility] at check-in to the Emergency Department and triage nurse.  Vinorelbine injection What is this medicine? VINORELBINE (vi NOR el been) is a chemotherapy drug. It targets fast dividing cells, like cancer cells, and causes these cells to die. This medicine is used to treat lung cancer. This medicine may be used for other purposes; ask your health care provider or pharmacist if you have questions. COMMON BRAND NAME(S): Navelbine What should I tell my health care provider before I take this medicine? They need to know if you have any of these conditions:  blockage in your bowel  liver disease  low blood counts, like low white cell, platelet, or red cell counts  lung or breathing disease, like asthma  tingling of the fingers or toes, or other nerve disorder  an unusual or allergic reaction to vinorelbine, other chemotherapy agents, other medicines, foods, dyes, or preservatives  pregnant or  trying to get pregnant  breast-feeding How should I use this medicine? This medicine is for infusion into a vein. It is given by a healthcare professional in a hospital or clinic setting. Talk to your pediatrician regarding the use of this medicine in children. Special care may be needed. Overdosage: If you think you have taken too much of this medicine contact a poison control center or emergency room at once. NOTE: This medicine is only for you. Do not share this medicine with others. What if I miss a dose? It is important not to miss your dose. Call your doctor or health care professional if you are unable to keep an appointment. What may interact with this medicine?  certain antibiotics like erythromycin or clarithromycin  certain antivirals for HIV or hepatitis  certain medicines for fungal infections like fluconazole, ketoconazole, itraconazole, posaconazole, or voriconazole  cimetidine  ciprofloxacin  conivaptan  crizotinib  cyclosporine  diltiazem  dronedarone  fluvoxamine  grapefruit juice  idelalisib  imatinib  nefazodone  nelfinavir  verapamil This list may not describe all possible interactions. Give your health care provider a list of all the medicines, herbs, non-prescription drugs, or dietary supplements you use. Also tell them if you smoke, drink alcohol, or use illegal drugs. Some items may interact with your medicine. What should I watch for while using this medicine? Your condition will be monitored carefully while you are receiving this medicine. This medicine may make you feel generally unwell. This is not uncommon as chemotherapy can affect healthy cells as well as cancer cells. Report any side effects. Continue your course of treatment  even though you feel ill unless your healthcare professional tells you to stop. Do not become pregnant while taking this medicine or for 6 months after stopping it. Women should inform their healthcare  professional if they wish to become pregnant or think they might be pregnant. Men should not father a child while taking this medicine and for 3 months after stopping it. There is potential for serious side effects to an unborn child. Talk to your healthcare professional for more information. Do not breast-feed while taking this medicine or for 9 days after stopping it. This may make it more difficult to father a child. Talk to your healthcare professional if you are concerned about your fertility. This medicine will cause constipation. Try to have a bowel movement at least every 2 to 3 days. If you do not have a bowel movement for 3 days, call your healthcare professional. This medicine may increase your risk of getting an infection. Call your healthcare professional for advice if you get a fever, chills, or sore throat, or other symptoms of a cold or flu. Do not treat yourself. Try to avoid being around people who are sick. Call your healthcare professional if you are around anyone with measles, chickenpox, or if you develop sores or blisters that do not heal properly. You may need blood work done while you are taking this medicine. Avoid taking medicines that contain aspirin, acetaminophen, ibuprofen, naproxen, or ketoprofen unless instructed by your healthcare professional. These medicines may hide a fever. Be careful brushing or flossing your teeth or using a toothpick because you may get an infection or bleed more easily. If you have any dental work done, tell your dentist you are receiving this medicine. What side effects may I notice from receiving this medicine? Side effects that you should report to your doctor or health care professional as soon as possible:  allergic reactions like skin rash, itching or hives; swelling of the face, lips, or tongue  breathing problems  constipation  pain, redness, or irritation at site where injected  signs and symptoms of bleeding such as bloody or  black, tarry stools; red or dark brown urine; spitting up blood or brown material that looks like coffee grounds; red spots on the skin; unusual bruising or bleeding from the eyes, gums, or nose  signs and symptoms of infection like fever; chills; cough; sore throat; pain or trouble passing urine  signs and symptoms of liver injury like dark yellow or brown urine; general ill feeling or flu-like symptoms; light-colored stools; loss of appetite; nausea; right upper belly pain; unusually weak or tired; yellowing of the eyes or skin  signs and symptoms of low red blood cells or anemia such as unusually weak or tired; feeling faint or lightheaded; falls; breathing problems  tingling, numbness in the hands or feet Side effects that usually do not require medical attention (report to your doctor or health care professional if they continue or are bothersome):  jaw pain  loss of appetite  nausea, vomiting  stomach pain This list may not describe all possible side effects. Call your doctor for medical advice about side effects. You may report side effects to FDA at 1-800-FDA-1088. Where should I keep my medicine? This drug is given in a hospital or clinic and will not be stored at home. NOTE: This sheet is a summary. It may not cover all possible information. If you have questions about this medicine, talk to your doctor, pharmacist, or health care provider.  2020 [employer]  Standard (2018-05-10 17:49:13)

## 2020-10-16 ENCOUNTER — Telehealth: Payer: Self-pay | Admitting: *Deleted

## 2020-10-16 LAB — CANCER ANTIGEN 27.29: CA 27.29: 261.8 U/mL — ABNORMAL HIGH (ref 0.0–38.6)

## 2020-10-16 NOTE — Telephone Encounter (Signed)
Called pt to see how she did with her treatment yesterday & she reports well except some pain at tumor sites but pain med helped.  She denies any other symptoms & knows how to reach [place] if she needs to.

## 2020-10-16 NOTE — Telephone Encounter (Signed)
-----   Message from Lester Burnett, RN sent at 10/15/2020  9:57 AM EST ----- Regarding: Dr. [name] Pt. received first time Vinorelbine and tolerated well without any difficulties.

## 2020-10-21 ENCOUNTER — Telehealth: Payer: Self-pay | Admitting: *Deleted

## 2020-10-21 NOTE — Telephone Encounter (Signed)
This RN received call from Kathlee Nations - nurse navigator with [facility] per 2nd opinion review.  Per review of records- pt does not meet criteria for any research studies and the review team is in agreement of current plan of therapy prescribed by this office.  They could see the patient to discuss but overall would not offer any other treatment then what is being currently recommended.  Kathlee Nations stated she tried to call the patient with above review- but pt's number stated it was not a working number.  Pt is scheduled this Friday for visit and treatment- this note will be forwarded to MD for review of communication.  Return call number for Kathlee Nations is [phone].

## 2020-10-22 ENCOUNTER — Inpatient Hospital Stay: Payer: Medicaid Other

## 2020-10-22 ENCOUNTER — Inpatient Hospital Stay (HOSPITAL_BASED_OUTPATIENT_CLINIC_OR_DEPARTMENT_OTHER): Payer: Medicaid Other | Admitting: Oncology

## 2020-10-22 ENCOUNTER — Other Ambulatory Visit: Payer: Self-pay

## 2020-10-22 VITALS — HR 102

## 2020-10-22 VITALS — BP 118/86 | HR 120 | Temp 99.0°F | Resp 18 | Ht 60.0 in | Wt 186.9 lb

## 2020-10-22 DIAGNOSIS — Z171 Estrogen receptor negative status [ER-]: Secondary | ICD-10-CM

## 2020-10-22 DIAGNOSIS — Z5112 Encounter for antineoplastic immunotherapy: Secondary | ICD-10-CM | POA: Diagnosis not present

## 2020-10-22 DIAGNOSIS — C7951 Secondary malignant neoplasm of bone: Secondary | ICD-10-CM

## 2020-10-22 DIAGNOSIS — C50919 Malignant neoplasm of unspecified site of unspecified female breast: Secondary | ICD-10-CM

## 2020-10-22 DIAGNOSIS — C50411 Malignant neoplasm of upper-outer quadrant of right female breast: Secondary | ICD-10-CM | POA: Diagnosis not present

## 2020-10-22 DIAGNOSIS — Z7189 Other specified counseling: Secondary | ICD-10-CM

## 2020-10-22 LAB — CBC WITH DIFFERENTIAL/PLATELET
Abs Immature Granulocytes: 0.07 10*3/uL (ref 0.00–0.07)
Basophils Absolute: 0 10*3/uL (ref 0.0–0.1)
Basophils Relative: 1 %
Eosinophils Absolute: 0.1 10*3/uL (ref 0.0–0.5)
Eosinophils Relative: 2 %
HCT: 24.5 % — ABNORMAL LOW (ref 36.0–46.0)
Hemoglobin: 8 g/dL — ABNORMAL LOW (ref 12.0–15.0)
Immature Granulocytes: 1 %
Lymphocytes Relative: 22 %
Lymphs Abs: 1.6 10*3/uL (ref 0.7–4.0)
MCH: 29 pg (ref 26.0–34.0)
MCHC: 32.7 g/dL (ref 30.0–36.0)
MCV: 88.8 fL (ref 80.0–100.0)
Monocytes Absolute: 0.4 10*3/uL (ref 0.1–1.0)
Monocytes Relative: 5 %
Neutro Abs: 5.3 10*3/uL (ref 1.7–7.7)
Neutrophils Relative %: 69 %
Platelets: 264 10*3/uL (ref 150–400)
RBC: 2.76 MIL/uL — ABNORMAL LOW (ref 3.87–5.11)
RDW: 18 % — ABNORMAL HIGH (ref 11.5–15.5)
WBC: 7.5 10*3/uL (ref 4.0–10.5)
nRBC: 0 % (ref 0.0–0.2)

## 2020-10-22 LAB — COMPREHENSIVE METABOLIC PANEL
ALT: 9 U/L (ref 0–44)
AST: 12 U/L — ABNORMAL LOW (ref 15–41)
Albumin: 3.6 g/dL (ref 3.5–5.0)
Alkaline Phosphatase: 65 U/L (ref 38–126)
Anion gap: 9 (ref 5–15)
BUN: 8 mg/dL (ref 6–20)
CO2: 30 mmol/L (ref 22–32)
Calcium: 9.3 mg/dL (ref 8.9–10.3)
Chloride: 105 mmol/L (ref 98–111)
Creatinine, Ser: 0.74 mg/dL (ref 0.44–1.00)
GFR, Estimated: >60 mL/min (ref >60)
Glucose, Bld: 93 mg/dL (ref 70–99)
Potassium: 3.8 mmol/L (ref 3.5–5.1)
Sodium: 144 mmol/L (ref 135–145)
Total Bilirubin: <0.2 mg/dL — ABNORMAL LOW (ref 0.3–1.2)
Total Protein: 7.3 g/dL (ref 6.5–8.1)

## 2020-10-22 MED ORDER — PROCHLORPERAZINE MALEATE 10 MG PO TABS
10.0000 mg | ORAL_TABLET | Freq: Once | ORAL | Status: AC
  Administered 2020-10-22: 10 mg via ORAL

## 2020-10-22 MED ORDER — SODIUM CHLORIDE 0.9 % IV SOLN
Freq: Once | INTRAVENOUS | Status: AC
  Filled 2020-10-22: qty 250

## 2020-10-22 MED ORDER — SODIUM CHLORIDE 0.9% FLUSH
10.0000 mL | INTRAVENOUS | Status: DC | PRN
  Administered 2020-10-22: 10 mL
  Filled 2020-10-22: qty 10

## 2020-10-22 MED ORDER — PROCHLORPERAZINE MALEATE 10 MG PO TABS
ORAL_TABLET | ORAL | Status: AC
  Filled 2020-10-22: qty 1

## 2020-10-22 MED ORDER — HEPARIN SOD (PORK) LOCK FLUSH 100 UNIT/ML IV SOLN
500.0000 [IU] | Freq: Once | INTRAVENOUS | Status: AC | PRN
  Administered 2020-10-22: 500 [IU]
  Filled 2020-10-22: qty 5

## 2020-10-22 MED ORDER — VINORELBINE TARTRATE CHEMO INJECTION 50 MG/5ML
25.0000 mg/m2 | Freq: Once | INTRAVENOUS | Status: AC
  Administered 2020-10-22: 47 mg via INTRAVENOUS
  Filled 2020-10-22: qty 4.7

## 2020-10-22 NOTE — Progress Notes (Signed)
Per Dr. [name] - okay to treat with elevated heart rate.

## 2020-10-22 NOTE — Progress Notes (Signed)
[place]  [phone] [phone]   ID: [name] DOB: 12-24-1975  MR#: [id number]  JAS#:[id number]  Patient Care Team: Dorena Dew, FNP as PCP - General (Family Medicine) Alphonsa Overall, MD as Consulting Physician (General Surgery) Britne Borelli, [name], MD as Consulting Physician (Oncology) Gery Pray, MD as Consulting Physician (Radiation Oncology) Delice Bison, Charlestine Massed, NP as Nurse Practitioner (Hematology and Oncology) Alda Berthold, DO as Consulting Physician (Neurology) OTHER MD:  CHIEF COMPLAINT: triple negative stage IV breast cancer  CURRENT TREATMENT: Navelbine   INTERVAL HISTORY: Gwendalynn returns today for follow up and treatment of her triple negative metastatic breast cancer  She was switched to Navelbine on 10/15/2020 due to documented disease progression on chest CT 10/06/2020.  She receives this drug days 1 and 8 of each 21-day cycle.  We reached out to [facility] for a second opinion regarding her treatment. Per telephone note from my nurse Val yesterday, 10/21/2020, the patient does not meet criteria for any research studies, and the review team is in agreement with the current plan of therapy we're giving her.  We have also been following her CA 27-29: Lab Results  Component Value Date   CA2729 261.8 (H) 10/15/2020   CA2729 246.1 (H) 09/24/2020   CA2729 238.4 (H) 08/27/2020   CA2729 290.5 (H) 07/31/2020   CA2729 324.1 (H) 07/10/2020    REVIEW OF SYSTEMS: Sharma has transportation problems and arrived late.  They had actually canceled her treatment but they agreed to work her in.  A brief review of systems today shows no significant change from baseline   COVID 19 VACCINATION STATUS: She received Pfizer x2.    BREAST CANCER HISTORY: From the original intake note:  Vlada herself noted a change in her right breast sometime around September or October 2016. She did not bring it to intermediate medical attention, but  on 01/13/2016 she established herself in Dr. Smith Robert' service and she was set up for bilateral diagnostic mammography with tomosynthesis and bilateral ultrasonography at the [facility] 01/19/2016. The breast density was category B. In the upper outer quadrant of the right breast there was a spiculated mass measuring 2.8 cm. On physical exam this was palpable. Targeted ultrasonography confirmed an irregular hypoechoic mass in the right breast 11:30 o'clock position measuring 2.6 cm maximally. Ultrasound of the right axilla showed a morphologically abnormal lymph node.  In the left breast there were some tubular densities behind the areola which by ultrasonography showed benign ductal ectasia.  On 01/28/2016 Alayasia underwent biopsy of the right breast mass and abnormal right axillary lymph node. The pathology from this procedure (S [id number]) showed the lymph node to be benign. In the breast however there was an invasive ductal carcinoma, grade 3, which was estrogen and progesterone receptor negative. The proliferation marker was 70%. HER-2 was not amplified with a signals ratio of 1.32. The number per cell was 2.05.  The patient's subsequent history is as detailed below   PAST MEDICAL HISTORY: Past Medical History:  Diagnosis Date  . Anxiety   . Breast cancer ([place])   . Depression   . GERD (gastroesophageal reflux disease)   . History of radiation therapy 11/15/16-01/12/17   right chest wall and axilla treated to 45 Gy in 25 fractions, boosted and additional 14 Gy in 8 fractions  . Hypertension    diet controlled  . Obesity (BMI 35.0-39.9 without comorbidity)   . Pneumonia    as a child  . Seasonal  allergies   . Sickle cell trait ([place])   . Termination of pregnancy (fetus) 04/02/16    PAST SURGICAL HISTORY: Past Surgical History:  Procedure Laterality Date  . CESAREAN SECTION     2004 and 2007  . MASTECTOMY W/ SENTINEL NODE BIOPSY Right 09/06/2016   Procedure: RIGHT BREAST MASTECTOMY  WITH RIGHT AXILLARY SENTINEL LYMPH NODE BIOPSY;  Surgeon: Alphonsa Overall, MD;  Location: [facility];  Service: General;  Laterality: Right;  . PORT-A-CATH REMOVAL Left 09/06/2016   Procedure: REMOVAL PORT-A-CATH;  Surgeon: Alphonsa Overall, MD;  Location: [facility];  Service: General;  Laterality: Left;  . PORTACATH PLACEMENT    . PORTACATH PLACEMENT N/A 07/09/2019   Procedure: INSERTION PORT-A-CATH WITH ULTRASOUND;  Surgeon: Alphonsa Overall, MD;  Location: [facility] OR;  Service: General;  Laterality: N/A;    FAMILY HISTORY Family History  Problem Relation Age of Onset  . Hypertension Mother   . Cancer Mother        dx "intestinal cancer" in her 36s; +surgery  . Other Mother        hysterectomy at young age for unspecified cause  . Heart Problems Mother   . Breast cancer Cousin        maternal 1st cousin dx female breast cancer at 41-46y  . Cancer Father   . Hypertension Father   . Heart Problems Maternal Aunt   . Diabetes Maternal Aunt   . Breast cancer Maternal Uncle        dx 64-65  . Heart Problems Maternal Uncle   . Breast cancer Maternal Grandmother 4  . Throat cancer Maternal Grandfather        d. 41s; smoker  . Sickle cell anemia Paternal Aunt   . Congestive Heart Failure Maternal Aunt   . Multiple sclerosis Cousin   . Cancer Other        maternal great uncle (MGM's brother); cancer removed from his side  . Heart attack Paternal Aunt        d. early 75s  The patient has very little information about her father. Her mother is currently 50 years old. She had a history of cervical cancer at age 30. The patient had 2 brothers, no sisters. The maternal grandfather had throat cancer. A maternal uncle was diagnosed with breast cancer as well as prostate cancer at the age of 52. 2 maternal cousins, one of them female, had breast cancer as well.   GYNECOLOGIC HISTORY:  No LMP recorded. Menarche age 37, first live birth age 28. The patient is GX P4. She was still  having regular periods at the time of diagnosis. She took oral contraceptives in the 1990s with no side effects.--.  Stopped with chemotherapy and have not resumed as of May 2019   SOCIAL HISTORY:  (Updated               ) She works on and off as a Market researcher. The patient's significant other [name] works at [employer].  At home with the patient are her 3 children [name], [place] and [place]. There are age 19, 39, and 9 as of November 2019.  2 of them are disabled or have significant health problems, one with sickle cell disease, the other with autism and developmental delay.  The patient's son [name], currently 48 years old, lives in [place].    ADVANCED DIRECTIVES: Not in place   HEALTH MAINTENANCE: Social History   Tobacco  Use  . Smoking status: Former Smoker    Packs/day: 1.00    Years: 20.00    Pack years: 20.00    Types: Cigarettes    Quit date: 04/01/2018    Years since quitting: 2.5  . Smokeless tobacco: Never Used  . Tobacco comment: Patient has quit smoking x 1 year now  Vaping Use  . Vaping Use: Never used  Substance Use Topics  . Alcohol use: Yes    Comment: occ  . Drug use: No    Colonoscopy:  PAP:  Bone density:  Lipid panel:  No Known Allergies  Current Outpatient Medications on File Prior to Visit  Medication Sig Dispense Refill  . calcium carbonate (TUMS - DOSED IN MG ELEMENTAL CALCIUM) 500 MG chewable tablet Chew 1 tablet by mouth daily as needed for indigestion or heartburn.    Marland Kitchen LORazepam (ATIVAN) 0.5 MG tablet Take 1 tablet (0.5 mg total) by mouth daily as needed for anxiety. 30 tablet 0  . oxyCODONE (OXY IR/ROXICODONE) 5 MG immediate release tablet Take 1 tablet (5 mg total) by mouth every 8 (eight) hours as needed for severe pain. 90 tablet 0  . polyethylene glycol (MIRALAX / GLYCOLAX) 17 g packet Take 17 g by mouth daily. 14 each 0   Current Facility-Administered Medications on File Prior to  Visit  Medication Dose Route Frequency Provider Last Rate Last Admin  . sodium chloride flush (NS) 0.9 % injection 10 mL  10 mL Intracatheter PRN Cozette Braggs, [name], MD   10 mL at 10/22/20 1146    OBJECTIVE: African-American woman who appears stated age  44:   10/22/20 1155  BP: 118/86  Pulse: (!) 120  Resp: 18  Temp: 99 F (37.2 C)  SpO2: 100%   Wt Readings from Last 3 Encounters:  10/22/20 186 lb 14.4 oz (84.8 kg)  10/08/20 183 lb 14.4 oz (83.4 kg)  09/24/20 186 lb 4.8 oz (84.5 kg)   Body mass index is 36.5 kg/m.    ECOG FS: 1  Sclerae unicteric, EOMs intact Wearing a mask No cervical or supraclavicular adenopathy Lungs no rales or rhonchi Heart regular rate and rhythm Abd soft, nontender, positive bowel sounds MSK no focal spinal tenderness, no upper extremity lymphedema Neuro: nonfocal, well oriented, appropriate affect Breasts: Deferred   Right breast 05/06/2020    Right chest wall area 11/22/2019, which is the new baseline   LAB RESULTS: CMP Latest Ref Rng & Units 10/15/2020 10/08/2020 09/24/2020  Glucose 70 - 99 mg/dL 105(H) 91 102(H)  BUN 6 - 20 mg/dL 6 8 7   Creatinine 0.44 - 1.00 mg/dL 0.81 0.69 0.72  Sodium 135 - 145 mmol/L 142 141 141  Potassium 3.5 - 5.1 mmol/L 3.9 3.5 3.6  Chloride 98 - 111 mmol/L 105 104 103  CO2 22 - 32 mmol/L 28 30 30   Calcium 8.9 - 10.3 mg/dL 9.7 9.2 9.7  Total Protein 6.5 - 8.1 g/dL 7.7 7.4 7.4  Total Bilirubin 0.3 - 1.2 mg/dL 0.4 0.3 <0.2(L)  Alkaline Phos 38 - 126 U/L 75 95 87  AST 15 - 41 U/L 12(L) 14(L) 12(L)  ALT 0 - 44 U/L 7 13 12     CBC    Component Value Date/Time   WBC 10.7 (H) 10/15/2020 0757   RBC 3.06 (L) 10/15/2020 0758   RBC 3.09 (L) 10/15/2020 0757   HGB 9.0 (L) 10/15/2020 0757   HGB 9.4 (L) 08/07/2020 1129   HGB 10.4 (L) 10/04/2016 1154   HCT 27.2 (  L) 10/15/2020 0757   HCT 31.3 (L) 10/04/2016 1154   PLT 293 10/15/2020 0757   PLT 309 08/07/2020 1129   PLT 320 10/04/2016 1154   MCV 88.0  10/15/2020 0757   MCV 95.7 10/04/2016 1154   MCH 29.1 10/15/2020 0757   MCHC 33.1 10/15/2020 0757   RDW 18.2 (H) 10/15/2020 0757   RDW 16.3 (H) 10/04/2016 1154   LYMPHSABS 1.5 10/15/2020 0757   LYMPHSABS 1.7 10/04/2016 1154   MONOABS 1.2 (H) 10/15/2020 0757   MONOABS 0.5 10/04/2016 1154   EOSABS 0.1 10/15/2020 0757   EOSABS 0.3 10/04/2016 1154   BASOSABS 0.1 10/15/2020 0757   BASOSABS 0.0 10/04/2016 1154    STUDIES: CT Chest W Contrast  Result Date: 10/06/2020 CLINICAL DATA:  Breast cancer restaging.  Chemotherapy in progress. EXAM: CT CHEST WITH CONTRAST TECHNIQUE: Multidetector CT imaging of the chest was performed during intravenous contrast administration. CONTRAST:  56m OMNIPAQUE IOHEXOL 300 MG/ML  SOLN COMPARISON:  08/04/2020 FINDINGS: Cardiovascular: Left Port-A-Cath tip: Right atrium. Mediastinum/Nodes: Irregular and low-density left axillary adenopathy extending towards the cutaneous surface and measuring up to 2.7 cm in short axis on image 56 of series 2, previously 2.3 cm. The field of view of today's CT includes more of the left breast which was excluded on prior imaging, although not the entirety of the breast. There is cutaneous and subcutaneous nodularity along the left upper breast including a 1.5 by 1.0 cm cutaneous nodule on image 68 of series 2 and an adjacent subcutaneous mass measuring 1.8 by 1.7 cm on image 69 of series 2. Pathologic left subcarinal adenopathy is also appreciable, similar to prior. Right internal mammary adenopathy, 0.8 cm in short axis on image 50 of series 2, previously measured at 0.8 cm. Right axillary and subpectoral lymph nodes including a 1.5 cm right axillary node on image 65 of series 2 (previously 0.8 cm). Tumor deposits are present along the right pectoralis muscle, including a 1.7 by 2.7 cm nodule along the superficial fascia margin on image 67 of series 2, previously 2.0 by 1.0 cm. Moreover, moreover, the right pectoralis musculature is notably  thicker on the left, measuring 2.6 cm in short axis, whereas the left side is 1.2 cm. Large defect of the right inferior breast likely postoperative. Cutaneous thickening along the upper margin of this presumed surgical defect. Severe thinning of the soft tissues anterior to the rib cage along the lower right chest. Right hilar node 1.3 cm in short axis on image fifty-seven of series 2, previously 1.0 cm by my measurements. Lungs/Pleura: Stable scarring in the right middle lobe. Stable scarring along the left hemidiaphragm. Upper Abdomen: Unremarkable Musculoskeletal: Thickened right pectoralis musculature is noted above. Subtle demineralization of the right anterior fourth and fifth ribs in the region of overlying soft tissue thinning, probably reactive although a component of radiation therapy related findings is a possibility. Skeptical that this is due to tumor invasion of the ribs. IMPRESSION: 1. Worsening bilateral axillary, subpectoral, and right internal mammary adenopathy, with enlarging cutaneous and subcutaneous masses along the left upper breast, right pectoralis muscle, and enlarging right pectoralis muscle masses. 2. Subtle demineralization of the right anterior fourth and fifth ribs in the region of overlying soft tissue thinning, probably reactive although a component of radiation therapy related findings is a possibility. 3. Mild enlargement of a right hilar lymph node, significance uncertain. 4. Left Port-A-Cath tip is in the right atrium. 5. Severe thinning of the soft tissues anterior to the rib cage  along the lower right chest. Electronically Signed   By: Van Clines M.D.   On: 10/06/2020 09:33     RESEARCH: Referred to PREVENT study, but was pregnant at the time; referred to [facility] 11202, but the biopsied lymph node was benign; referred to weight loss study but declined; referred to [facility] 81 12/09/2000, but the timing of radiation and 8 was greater than 60 days past the date  of diagnosis; referred to health disparity study, but declined; referred to [id number] adjuvant therapy study, but the patient received Xeloda with radiation and therefore was ineligible   ASSESSMENT: 44 y.o. [place] woman status post right breast upper-outer quadrant biopsy 01/28/2016 for a clinical T2 N0 invasive ductal carcinoma, grade 3, triple negative, with an MIB-1 of 70%.  (a) suspicious right axillary lymph node biopsied 01/28/2016 was benign  (1) neoadjuvant chemotherapy: doxorubicin and cyclophosphamide in dose dense fashion 4 started 04/14/16, completed 05/26/2016, followed by paclitaxel and carboplatin weekly 12, Started 06/09/2016  (a) taxol discontinued after 7 doses because of neuropathy, last dose 07/21/2016  (2) genetics testing October 20, 2016 through the 32-gene Comprehensive Cancer Panel offered by [facility] Junius Roads, MD) (with MSH2 Exons 1-7 Inversion Analysis) found no deleterious mutations or VUSS  In APC, ATM, AXIN2, BARD1, BMPR1A, BRCA1, BRCA2, BRIP1, CDH1, CDK4, CDKN2A, CHEK2, EPCAM, FANCC, MLH1, MSH2, MSH6, MUTYH, NBN, PALB2, PMS2, POLD1, POLE, PTEN, RAD51C, RAD51D, SCG5/GREM1, SMAD4, STK11, TP53, VHL, and XRCC2.    (3) right mastectomy and sentinel lymph node sampling 09/06/2016 showed a residual ypT1c ypN0, invasive ductal carcinoma, grade 3, with negative margins. Repeat prognostic panel again triple negative   (4) adjuvant radiation with capecitabine/Xeloda sensitization 11/15/16 - 01/12/17 Site/dose:   Right Chest Wall and axilla (4 field) treated to 45 Gy in 25 fractions, and then Boosted an additional 14.4 Gy in 8 fractions.  (5) tobacco abuse disorder: The patient quit smoking 04/04/2018  METASTATIC DISEASE:  July 2020: chest wall, bones, nodes (6) nonspecific changes noted on chest CT 06/11/2019 were clarified by PET scan 06/19/2019 showing hypermetabolic disease in the right anterior chest wall, right internal mammary nodes, right and left  axillary nodes, but no metastatic disease in the neck, lungs, abdomen or pelvis.  Bone marrow uptake suggests bony metastatic disease.  (a) CARIS requested obtained from 06/28/2019 sample confirmed a triple negativity, the tumor also was negative for the androgen receptor, was MSI stable and mismatch repair status proficient, with a low mutational burden.  BRCA 1 and 2 were negative and PD-L1 was negative.  PIK3 showed a variant of uncertain significance.  However the tumor was genomic LOH high  (b) CA-27-29 is informative: was 118.3 on 06/04/2019  (7) zoledronate started 07/17/2019--on hold currently due to dental issues, and until 6 weeks at least after dental work  (8) carboplatin/ gemcitabine days 1 and 8 Q21 day cycle started 07/10/2019, last dose 10/30/2019  (a) restaging studies 09/2019 showed no progression  (b) restaging studies after 6 cycles showed mild disease progression  (9) cyclophosphamide, methotrexate, fluorouracil chemotherapy started 11/13/2019, repeated every 21 days.  (a) discontinued after cycle 3 (12/31/2019): No evidence of response  (10) started capecitabine 01/21/2020, given 1 week on and 1 week off at standard doses (1500 mg twice daily)   (a) Discontinued after almost three months of treatment (last on 04/06/2020)  (b) Disease progression on 4/27 CT scan showing increasing right chest wall disease, left breast lesions, and ? Liver involvement.  (c) MRI liver 04/14/2020 shows no liver invovlement  (  11) Started liposomal doxorubicin/Doxil given on day 1 of a 21 day cycle on 04/07/2020  (a) echo on 04/02/2020 shows EF of 50-55%, repeat in 06/2020  (b) chest CT scan after 4 cycles of Doxil shows some evidence of progression in left axillary and right internal mammary adenopathy  (c) Doxil discontinued after 06/09/2020 dose  (12) started sacituzumab govitecan/ Trodelvy 07/10/2020  (a) day 8 cycle 2 omitted due to febrile neutropenia requiring admission  (b) changed to every  two week dosing with Udenyca support after cycle 2  (c) discontinued after 09/24/2020 dose with evidence of disease progression  (13) started Navelbine 10/15/2020, to be repeated days 1 and 8 of each 21-day cycle  (a) chest CT scan 10/06/2020 is new baseline study for measurable disease   PLAN: Kairie is tolerating the [place] being well, with only mild constipation.  She tells me the MiraLAX prescribed works better than the [employer] over-the-counter.  I will be glad to reorder that for her.  We will continue treatments days 1 and 8 of each 21-day cycle and after 3 cycles she will be restaged.  We were interested in her getting a second opinion and possibly participating in a trial at [facility].  However given the transportation issue and the fact that they actually do not have a trial she fits into right now [facility] suggested that perhaps she should consider this at a later time.  She will see [place] again in 2 weeks.  She knows to call for any other issue that may develop before that time  Total encounter time 20 minutes.Sarajane Jews C. Layanna Charo, MD 10/22/20 12:00 PM Medical Oncology and Hematology [facility] [place], [place] [address] Tel. [phone]    Fax. [phone]   I, Wilburn Mylar, am acting as scribe for Dr. [name]. Nyliah Nierenberg.  I, Lurline Del MD, have reviewed the above documentation for accuracy and completeness, and I agree with the above.   *Total Encounter Time as defined by the Centers for Medicare and Medicaid Services includes, in addition to the face-to-face time of a patient visit (documented in the note above) non-face-to-face time: obtaining and reviewing outside history, ordering and reviewing medications, tests or procedures, care coordination (communications with other health care professionals or caregivers) and documentation in the medical record.

## 2020-10-22 NOTE — Patient Instructions (Signed)
[place] Discharge Instructions for Patients Receiving Chemotherapy  Today you received the following chemotherapy agents: Vinorelbine  To help prevent nausea and vomiting after your treatment, we encourage you to take your nausea medication  as prescribed.    If you develop nausea and vomiting that is not controlled by your nausea medication, call the clinic.   BELOW ARE SYMPTOMS THAT SHOULD BE REPORTED IMMEDIATELY:  *FEVER GREATER THAN 100.5 F  *CHILLS WITH OR WITHOUT FEVER  NAUSEA AND VOMITING THAT IS NOT CONTROLLED WITH YOUR NAUSEA MEDICATION  *UNUSUAL SHORTNESS OF BREATH  *UNUSUAL BRUISING OR BLEEDING  TENDERNESS IN MOUTH AND THROAT WITH OR WITHOUT PRESENCE OF ULCERS  *URINARY PROBLEMS  *BOWEL PROBLEMS  UNUSUAL RASH Items with * indicate a potential emergency and should be followed up as soon as possible.  Feel free to call the clinic should you have any questions or concerns. The clinic phone number is [phone].  Please show the [place] at check-in to the Emergency Department and triage nurse.

## 2020-10-23 ENCOUNTER — Telehealth: Payer: Self-pay | Admitting: Oncology

## 2020-10-23 ENCOUNTER — Other Ambulatory Visit: Payer: Self-pay | Admitting: Oncology

## 2020-10-23 NOTE — Telephone Encounter (Signed)
Scheduled appts per 11/18 los. Pt confirmed appt date and time.

## 2020-10-23 NOTE — Progress Notes (Signed)
[place] conversation between [employer] and [facility] 10/21/2020:   This RN received call from Kathlee Nations - nurse navigator with [facility] per 2nd opinion review.  Per review of records- pt does not meet criteria for any research studies and the review team is in agreement of current plan of therapy prescribed by this office.  They could see the patient to discuss but overall would not offer any other treatment then what is being currently recommended.  Kathlee Nations stated she tried to call the patient with above review- but pt's number stated it was not a working number.  Pt is scheduled this Friday for visit and treatment- this note will be forwarded to MD for review of communication.  Return call number for Kathlee Nations is [phone].

## 2020-11-03 ENCOUNTER — Telehealth: Payer: Self-pay

## 2020-11-03 NOTE — Telephone Encounter (Signed)
Pt called stating she would like to change chemo as she is not tolerating it well. Pt has infusion appt for 11/05/20. This LPN sent scheduling a message to arrange pt be placed on MD schedule that day to discuss chemo. Pt is in agreement with this plan.

## 2020-11-04 NOTE — Progress Notes (Signed)
[facility]  [phone] [phone]   ID: [name] DOB: 01/21/1976  MR#: [id number]  KGU#:[id number]  Patient Care Team: Dorena Dew, FNP as PCP - General (Family Medicine) Alphonsa Overall, MD as Consulting Physician (General Surgery) Maddux Vanscyoc, [name], MD as Consulting Physician (Oncology) Gery Pray, MD as Consulting Physician (Radiation Oncology) Delice Bison, Charlestine Massed, NP as Nurse Practitioner (Hematology and Oncology) Alda Berthold, DO as Consulting Physician (Neurology) OTHER MD:  CHIEF COMPLAINT: triple negative stage IV breast cancer  CURRENT TREATMENT: Navelbine   INTERVAL HISTORY: Ashleen was scheduled today for follow up and treatment of her triple negative metastatic breast cancer. However she did not show  She was switched to Navelbine on 10/15/2020 due to documented disease progression on chest CT 10/06/2020.  She receives this drug days 1 and 8 of each 21-day cycle. She contacted [place] on 11/03/2020 requesting to change her treatment as she is not tolerating it well.  We have also been following her CA 27-29: Lab Results  Component Value Date   CA2729 261.8 (H) 10/15/2020   CA2729 246.1 (H) 09/24/2020   CA2729 238.4 (H) 08/27/2020   CA2729 290.5 (H) 07/31/2020   CA2729 324.1 (H) 07/10/2020    REVIEW OF SYSTEMS: [place]    COVID 19 VACCINATION STATUS: She received [place] x2.    BREAST CANCER HISTORY: From the original intake note:  Amie herself noted a change in her right breast sometime around September or October 2016. She did not bring it to intermediate medical attention, but on 01/13/2016 she established herself in Dr. Smith Robert' service and she was set up for bilateral diagnostic mammography with tomosynthesis and bilateral ultrasonography at the [facility] 01/19/2016. The breast density was category B. In the upper outer quadrant of the right breast there was a spiculated mass measuring 2.8 cm. On physical  exam this was palpable. Targeted ultrasonography confirmed an irregular hypoechoic mass in the right breast 11:30 o'clock position measuring 2.6 cm maximally. Ultrasound of the right axilla showed a morphologically abnormal lymph node.  In the left breast there were some tubular densities behind the areola which by ultrasonography showed benign ductal ectasia.  On 01/28/2016 Merl underwent biopsy of the right breast mass and abnormal right axillary lymph node. The pathology from this procedure (S AAA [id number]) showed the lymph node to be benign. In the breast however there was an invasive ductal carcinoma, grade 3, which was estrogen and progesterone receptor negative. The proliferation marker was 70%. HER-2 was not amplified with a signals ratio of 1.32. The number per cell was 2.05.  The patient's subsequent history is as detailed below   PAST MEDICAL HISTORY: Past Medical History:  Diagnosis Date  . Anxiety   . Breast cancer ([place])   . Depression   . GERD (gastroesophageal reflux disease)   . History of radiation therapy 11/15/16-01/12/17   right chest wall and axilla treated to 45 Gy in 25 fractions, boosted and additional 14 Gy in 8 fractions  . Hypertension    diet controlled  . Obesity (BMI 35.0-39.9 without comorbidity)   . Pneumonia    as a child  . Seasonal allergies   . Sickle cell trait ([place])   . Termination of pregnancy (fetus) 04/02/16    PAST SURGICAL HISTORY: Past Surgical History:  Procedure Laterality Date  . CESAREAN SECTION     2004 and 2007  . MASTECTOMY W/ SENTINEL NODE BIOPSY Right 09/06/2016   Procedure: RIGHT BREAST MASTECTOMY WITH RIGHT  AXILLARY SENTINEL LYMPH NODE BIOPSY;  Surgeon: Alphonsa Overall, MD;  Location: [facility];  Service: General;  Laterality: Right;  . PORT-A-CATH REMOVAL Left 09/06/2016   Procedure: REMOVAL PORT-A-CATH;  Surgeon: Alphonsa Overall, MD;  Location: [facility];  Service: General;  Laterality: Left;  .  PORTACATH PLACEMENT    . PORTACATH PLACEMENT N/A 07/09/2019   Procedure: INSERTION PORT-A-CATH WITH ULTRASOUND;  Surgeon: Alphonsa Overall, MD;  Location: [facility] OR;  Service: General;  Laterality: N/A;    FAMILY HISTORY Family History  Problem Relation Age of Onset  . Hypertension Mother   . Cancer Mother        dx "intestinal cancer" in her 61s; +surgery  . Other Mother        hysterectomy at young age for unspecified cause  . Heart Problems Mother   . Breast cancer Cousin        maternal 1st cousin dx female breast cancer at 53-46y  . Cancer Father   . Hypertension Father   . Heart Problems Maternal Aunt   . Diabetes Maternal Aunt   . Breast cancer Maternal Uncle        dx 64-65  . Heart Problems Maternal Uncle   . Breast cancer Maternal Grandmother 24  . Throat cancer Maternal Grandfather        d. 55s; smoker  . Sickle cell anemia Paternal Aunt   . Congestive Heart Failure Maternal Aunt   . Multiple sclerosis Cousin   . Cancer Other        maternal great uncle (MGM's brother); cancer removed from his side  . Heart attack Paternal Aunt        d. early 35s  The patient has very little information about her father. Her mother is currently 91 years old. She had a history of cervical cancer at age 12. The patient had 2 brothers, no sisters. The maternal grandfather had throat cancer. A maternal uncle was diagnosed with breast cancer as well as prostate cancer at the age of 70. 2 maternal cousins, one of them female, had breast cancer as well.   GYNECOLOGIC HISTORY:  No LMP recorded. Menarche age 20, first live birth age 3. The patient is GX P4. She was still having regular periods at the time of diagnosis. She took oral contraceptives in the 1990s with no side effects.--.  Stopped with chemotherapy and have not resumed as of May 2019   SOCIAL HISTORY:  (Updated               ) She works on and off as a Market researcher. The patient's significant other [name] works at [employer]  [employer].  At home with the patient are her 3 children [name], [place] and Herman. There are age 62, 72, and 67 as of November 2019.  2 of them are disabled or have significant health problems, one with sickle cell disease, the other with autism and developmental delay.  The patient's son [name], currently 23 years old, lives in [place].    ADVANCED DIRECTIVES: Not in place   HEALTH MAINTENANCE: Social History   Tobacco Use  . Smoking status: Former Smoker    Packs/day: 1.00    Years: 20.00    Pack years: 20.00    Types: Cigarettes    Quit date: 04/01/2018    Years since quitting: 2.6  . Smokeless tobacco: Never Used  . Tobacco comment: Patient has quit smoking x 1  year now  Vaping Use  . Vaping Use: Never used  Substance Use Topics  . Alcohol use: Yes    Comment: occ  . Drug use: No    Colonoscopy:  PAP:  Bone density:  Lipid panel:  No Known Allergies  Current Outpatient Medications on File Prior to Visit  Medication Sig Dispense Refill  . calcium carbonate (TUMS - DOSED IN MG ELEMENTAL CALCIUM) 500 MG chewable tablet Chew 1 tablet by mouth daily as needed for indigestion or heartburn.    Marland Kitchen LORazepam (ATIVAN) 0.5 MG tablet Take 1 tablet (0.5 mg total) by mouth daily as needed for anxiety. 30 tablet 0  . oxyCODONE (OXY IR/ROXICODONE) 5 MG immediate release tablet Take 1 tablet (5 mg total) by mouth every 8 (eight) hours as needed for severe pain. 90 tablet 0  . polyethylene glycol (MIRALAX / GLYCOLAX) 17 g packet Take 17 g by mouth daily. 14 each 0   No current facility-administered medications on file prior to visit.    OBJECTIVE: African-American woman who appears stated age  There were no vitals filed for this visit. Wt Readings from Last 3 Encounters:  10/22/20 186 lb 14.4 oz (84.8 kg)  10/08/20 183 lb 14.4 oz (83.4 kg)  09/24/20 186 lb 4.8 oz (84.5 kg)   There is no height or weight on file to calculate BMI.    ECOG FS:  1    Right breast 05/06/2020    Right chest wall area 11/22/2019, which is the new baseline   LAB RESULTS: CMP Latest Ref Rng & Units 10/22/2020 10/15/2020 10/08/2020  Glucose 70 - 99 mg/dL 93 105(H) 91  BUN 6 - 20 mg/dL _0 Creatinine 0.44 - 1.00 mg/dL 0.74 0.81 0.69  Sodium 135 - 145 mmol/L 144 142 141  Potassium 3.5 - 5.1 mmol/L 3.8 3.9 3.5  Chloride 98 - 111 mmol/L 105 105 104  CO2 22 - 32 mmol/L _1 Calcium 8.9 - 10.3 mg/dL 9.3 9.7 9.2  Total Protein 6.5 - 8.1 g/dL 7.3 7.7 7.4  Total Bilirubin 0.3 - 1.2 mg/dL <0.2(L) 0.4 0.3  Alkaline Phos 38 - 126 U/L 65 75 95  AST 15 - 41 U/L 12(L) 12(L) 14(L)  ALT 0 - 44 U/L _2 CBC    Component Value Date/Time   WBC 7.5 10/22/2020 1140   RBC 2.76 (L) 10/22/2020 1140   HGB 8.0 (L) 10/22/2020 1140   HGB 9.4 (L) 08/07/2020 1129   HGB 10.4 (L) 10/04/2016 1154   HCT 24.5 (L) 10/22/2020 1140   HCT 31.3 (L) 10/04/2016 1154   PLT 264 10/22/2020 1140   PLT 309 08/07/2020 1129   PLT 320 10/04/2016 1154   MCV 88.8 10/22/2020 1140   MCV 95.7 10/04/2016 1154   MCH 29.0 10/22/2020 1140   MCHC 32.7 10/22/2020 1140   RDW 18.0 (H) 10/22/2020 1140   RDW 16.3 (H) 10/04/2016 1154   LYMPHSABS 1.6 10/22/2020 1140   LYMPHSABS 1.7 10/04/2016 1154   MONOABS 0.4 10/22/2020 1140   MONOABS 0.5 10/04/2016 1154   EOSABS 0.1 10/22/2020 1140   EOSABS 0.3 10/04/2016 1154   BASOSABS 0.0 10/22/2020 1140   BASOSABS 0.0 10/04/2016 1154    STUDIES: No results found.   RESEARCH: Referred to PREVENT study, but was pregnant at the time; referred to [facility] 11202, but the biopsied lymph node was benign; referred to weight loss study but declined; referred to [facility] 12/09/2000, but  the timing of radiation and 8 was greater than 60 days past the date of diagnosis; referred to health disparity study, but declined; referred to [id number] adjuvant therapy study, but the patient received Xeloda with radiation and therefore was  ineligible   ASSESSMENT: 44 y.o.  woman status post right breast upper-outer quadrant biopsy 01/28/2016 for a clinical T2 N0 invasive ductal carcinoma, grade 3, triple negative, with an MIB-1 of 70%.  (a) suspicious right axillary lymph node biopsied 01/28/2016 was benign  (1) neoadjuvant chemotherapy: doxorubicin and cyclophosphamide in dose dense fashion 4 started 04/14/16, completed 05/26/2016, followed by paclitaxel and carboplatin weekly 12, Started 06/09/2016  (a) taxol discontinued after 7 doses because of neuropathy, last dose 07/21/2016  (2) genetics testing October 20, 2016 through the 32-gene Comprehensive Cancer Panel offered by [facility] Junius Roads, MD) (with MSH2 Exons 1-7 Inversion Analysis) found no deleterious mutations or VUSS  In APC, ATM, AXIN2, BARD1, BMPR1A, BRCA1, BRCA2, BRIP1, CDH1, CDK4, CDKN2A, CHEK2, EPCAM, FANCC, MLH1, MSH2, MSH6, MUTYH, NBN, PALB2, PMS2, POLD1, POLE, PTEN, RAD51C, RAD51D, SCG5/GREM1, SMAD4, STK11, TP53, VHL, and XRCC2.    (3) right mastectomy and sentinel lymph node sampling 09/06/2016 showed a residual ypT1c ypN0, invasive ductal carcinoma, grade 3, with negative margins. Repeat prognostic panel again triple negative   (4) adjuvant radiation with capecitabine/Xeloda sensitization 11/15/16 - 01/12/17 Site/dose:   Right Chest Wall and axilla (4 field) treated to 45 Gy in 25 fractions, and then Boosted an additional 14.4 Gy in 8 fractions.  (5) tobacco abuse disorder: The patient quit smoking 04/04/2018  METASTATIC DISEASE:  July 2020: chest wall, bones, nodes (6) nonspecific changes noted on chest CT 06/11/2019 were clarified by PET scan 06/19/2019 showing hypermetabolic disease in the right anterior chest wall, right internal mammary nodes, right and left axillary nodes, but no metastatic disease in the neck, lungs, abdomen or pelvis.  Bone marrow uptake suggests bony metastatic disease.  (a) CARIS requested obtained from  06/28/2019 sample confirmed a triple negativity, the tumor also was negative for the androgen receptor, was MSI stable and mismatch repair status proficient, with a low mutational burden.  BRCA 1 and 2 were negative and PD-L1 was negative.  PIK3 showed a variant of uncertain significance.  However the tumor was genomic LOH high  (b) CA-27-29 is informative: was 118.3 on 06/04/2019  (7) zoledronate started 07/17/2019--on hold currently due to dental issues, and until 6 weeks at least after dental work  (8) carboplatin/ gemcitabine days 1 and 8 Q21 day cycle started 07/10/2019, last dose 10/30/2019  (a) restaging studies 09/2019 showed no progression  (b) restaging studies after 6 cycles showed mild disease progression  (9) cyclophosphamide, methotrexate, fluorouracil chemotherapy started 11/13/2019, repeated every 21 days.  (a) discontinued after cycle 3 (12/31/2019): No evidence of response  (10) started capecitabine 01/21/2020, given 1 week on and 1 week off at standard doses (1500 mg twice daily)   (a) Discontinued after almost three months of treatment (last on 04/06/2020)  (b) Disease progression on 4/27 CT scan showing increasing right chest wall disease, left breast lesions, and ? Liver involvement.  (c) MRI liver 04/14/2020 shows no liver invovlement  (11) Started liposomal doxorubicin/Doxil given on day 1 of a 21 day cycle on 04/07/2020  (a) echo on 04/02/2020 shows EF of 50-55%, repeat in 06/2020  (b) chest CT scan after 4 cycles of Doxil shows some evidence of progression in left axillary and right internal mammary adenopathy  (c) Doxil discontinued after 06/09/2020 dose  (12)  started sacituzumab govitecan/ Trodelvy 07/10/2020  (a) day 8 cycle 2 omitted due to febrile neutropenia requiring admission  (b) changed to every two week dosing with Udenyca support after cycle 2  (c) discontinued after 09/24/2020 dose with evidence of disease progression  (13) started Navelbine 10/15/2020, to  be repeated days 1 and 8 of each 21-day cycle  (a) chest CT scan 10/06/2020 is new baseline study for measurable disease   PLAN: Khaya did not show for her 11/05/2020 appointment.  Follow-up letter is being sent   Sarajane Jews C. Vaneta Hammontree, MD 11/05/20 4:55 PM Medical Oncology and Hematology [facility] [place], [place] [address] Tel. [phone]    Fax. [phone]   I, Wilburn Mylar, am acting as scribe for Dr. [name]. Aleeta Schmaltz.  I, Lurline Del MD, have reviewed the above documentation for accuracy and completeness, and I agree with the above.   *Total Encounter Time as defined by the Centers for Medicare and Medicaid Services includes, in addition to the face-to-face time of a patient visit (documented in the note above) non-face-to-face time: obtaining and reviewing outside history, ordering and reviewing medications, tests or procedures, care coordination (communications with other health care professionals or caregivers) and documentation in the medical record.

## 2020-11-05 ENCOUNTER — Inpatient Hospital Stay: Payer: Medicaid Other | Attending: Oncology

## 2020-11-05 ENCOUNTER — Inpatient Hospital Stay: Payer: Medicaid Other

## 2020-11-05 ENCOUNTER — Encounter: Payer: Self-pay | Admitting: Oncology

## 2020-11-05 ENCOUNTER — Inpatient Hospital Stay (HOSPITAL_BASED_OUTPATIENT_CLINIC_OR_DEPARTMENT_OTHER): Payer: Medicaid Other | Admitting: Oncology

## 2020-11-05 DIAGNOSIS — I10 Essential (primary) hypertension: Secondary | ICD-10-CM | POA: Insufficient documentation

## 2020-11-05 DIAGNOSIS — Z803 Family history of malignant neoplasm of breast: Secondary | ICD-10-CM | POA: Insufficient documentation

## 2020-11-05 DIAGNOSIS — Z170 Estrogen receptor positive status [ER+]: Secondary | ICD-10-CM | POA: Insufficient documentation

## 2020-11-05 DIAGNOSIS — D571 Sickle-cell disease without crisis: Secondary | ICD-10-CM | POA: Insufficient documentation

## 2020-11-05 DIAGNOSIS — Z87891 Personal history of nicotine dependence: Secondary | ICD-10-CM | POA: Insufficient documentation

## 2020-11-05 DIAGNOSIS — C50411 Malignant neoplasm of upper-outer quadrant of right female breast: Secondary | ICD-10-CM

## 2020-11-05 DIAGNOSIS — F418 Other specified anxiety disorders: Secondary | ICD-10-CM | POA: Insufficient documentation

## 2020-11-05 DIAGNOSIS — G893 Neoplasm related pain (acute) (chronic): Secondary | ICD-10-CM | POA: Insufficient documentation

## 2020-11-05 DIAGNOSIS — Z5112 Encounter for antineoplastic immunotherapy: Secondary | ICD-10-CM | POA: Insufficient documentation

## 2020-11-05 DIAGNOSIS — C779 Secondary and unspecified malignant neoplasm of lymph node, unspecified: Secondary | ICD-10-CM | POA: Insufficient documentation

## 2020-11-05 DIAGNOSIS — Z923 Personal history of irradiation: Secondary | ICD-10-CM | POA: Insufficient documentation

## 2020-11-05 DIAGNOSIS — C7989 Secondary malignant neoplasm of other specified sites: Secondary | ICD-10-CM | POA: Insufficient documentation

## 2020-11-05 DIAGNOSIS — K219 Gastro-esophageal reflux disease without esophagitis: Secondary | ICD-10-CM | POA: Insufficient documentation

## 2020-11-05 DIAGNOSIS — Z9011 Acquired absence of right breast and nipple: Secondary | ICD-10-CM | POA: Insufficient documentation

## 2020-11-05 DIAGNOSIS — Z809 Family history of malignant neoplasm, unspecified: Secondary | ICD-10-CM | POA: Insufficient documentation

## 2020-11-05 DIAGNOSIS — Z800 Family history of malignant neoplasm of digestive organs: Secondary | ICD-10-CM | POA: Insufficient documentation

## 2020-11-05 DIAGNOSIS — C7951 Secondary malignant neoplasm of bone: Secondary | ICD-10-CM | POA: Insufficient documentation

## 2020-11-05 DIAGNOSIS — Z171 Estrogen receptor negative status [ER-]: Secondary | ICD-10-CM

## 2020-11-06 ENCOUNTER — Telehealth: Payer: Self-pay | Admitting: Oncology

## 2020-11-06 NOTE — Telephone Encounter (Signed)
Scheduled appt per 12/2 sch msg   - pt is aware of appt date and time on 12/8

## 2020-11-10 NOTE — Progress Notes (Signed)
[place]  [phone] [phone]) [phone]   ID: [name] DOB: [birth year]  MR#: [id number]  IRJ#:[id number]  Patient Care Team: Dorena Dew, FNP as PCP - General (Family Medicine) Alphonsa Overall, MD as Consulting Physician (General Surgery) Rodell Marrs, [name], MD as Consulting Physician (Oncology) Gery Pray, MD as Consulting Physician (Radiation Oncology) Delice Bison, Charlestine Massed, NP as Nurse Practitioner (Hematology and Oncology) Alda Berthold, DO as Consulting Physician (Neurology) OTHER MD:  CHIEF COMPLAINT: triple negative stage IV breast cancer  CURRENT TREATMENT: eribulin   INTERVAL HISTORY: Kryslyn returns today for follow up and treatment of her triple negative metastatic breast cancer accompanied by her mother [name].  She was switched to Navelbine on 10/15/2020 due to documented disease progression on chest CT 10/06/2020.  She received this drug days 1 and 8 of each 21-day cycle. She contacted [place] on 11/03/2020 requesting to change her treatment as she is not tolerating it well.  We have also been following her CA 27-29: Lab Results  Component Value Date   CA2729 261.8 (H) 10/15/2020   CA2729 246.1 (H) 09/24/2020   CA2729 238.4 (H) 08/27/2020   CA2729 290.5 (H) 07/31/2020   CA2729 324.1 (H) 07/10/2020    REVIEW OF SYSTEMS: Alianys tells me her pain is uncontrolled.  She is taking OxyIR 5 mg 3 times a day and this does not begin to touch it.  She is not constipated from the medication.  She denies unusual headaches visual changes nausea vomiting or falls.  She continues to dress her chest wound every day and tells me the base is pink.  She is very clear that the pain got much worse since the start of Eugene Garnet being and she does not want any further doses of that chemotherapy.   COVID 19 VACCINATION STATUS: She received Moderna x2.    BREAST CANCER HISTORY: From the original intake note:  Virjean herself noted a change in her right  breast sometime around September or October 2016. She did not bring it to intermediate medical attention, but on 01/13/2016 she established herself in Dr. Smith Robert' service and she was set up for bilateral diagnostic mammography with tomosynthesis and bilateral ultrasonography at the [facility] 01/19/2016. The breast density was category B. In the upper outer quadrant of the right breast there was a spiculated mass measuring 2.8 cm. On physical exam this was palpable. Targeted ultrasonography confirmed an irregular hypoechoic mass in the right breast 11:30 o'clock position measuring 2.6 cm maximally. Ultrasound of the right axilla showed a morphologically abnormal lymph node.  In the left breast there were some tubular densities behind the areola which by ultrasonography showed benign ductal ectasia.  On 01/28/2016 [place] underwent biopsy of the right breast mass and abnormal right axillary lymph node. The pathology from this procedure (S [id number]) showed the lymph node to be benign. In the breast however there was an invasive ductal carcinoma, grade 3, which was estrogen and progesterone receptor negative. The proliferation marker was 70%. HER-2 was not amplified with a signals ratio of 1.32. The number per cell was 2.05.  The patient's subsequent history is as detailed below   PAST MEDICAL HISTORY: Past Medical History:  Diagnosis Date  . Anxiety   . Breast cancer ([place])   . Depression   . GERD (gastroesophageal reflux disease)   . History of radiation therapy 11/15/16-01/12/17   right chest wall and axilla treated to 45 Gy in 25 fractions, boosted and additional 14 Gy in  8 fractions  . Hypertension    diet controlled  . Obesity (BMI 35.0-39.9 without comorbidity)   . Pneumonia    as a child  . Seasonal allergies   . Sickle cell trait ([place])   . Termination of pregnancy (fetus) 04/02/16    PAST SURGICAL HISTORY: Past Surgical History:  Procedure Laterality Date  . CESAREAN SECTION      2004 and 2007  . MASTECTOMY W/ SENTINEL NODE BIOPSY Right 09/06/2016   Procedure: RIGHT BREAST MASTECTOMY WITH RIGHT AXILLARY SENTINEL LYMPH NODE BIOPSY;  Surgeon: Alphonsa Overall, MD;  Location: [facility];  Service: General;  Laterality: Right;  . PORT-A-CATH REMOVAL Left 09/06/2016   Procedure: REMOVAL PORT-A-CATH;  Surgeon: Alphonsa Overall, MD;  Location: [facility];  Service: General;  Laterality: Left;  . PORTACATH PLACEMENT    . PORTACATH PLACEMENT N/A 07/09/2019   Procedure: INSERTION PORT-A-CATH WITH ULTRASOUND;  Surgeon: Alphonsa Overall, MD;  Location: [facility];  Service: General;  Laterality: N/A;    FAMILY HISTORY Family History  Problem Relation Age of Onset  . Hypertension Mother   . Cancer Mother        dx "intestinal cancer" in her 46s; +surgery  . Other Mother        hysterectomy at young age for unspecified cause  . Heart Problems Mother   . Breast cancer Cousin        maternal 1st cousin dx female breast cancer at 53-46y  . Cancer Father   . Hypertension Father   . Heart Problems Maternal Aunt   . Diabetes Maternal Aunt   . Breast cancer Maternal Uncle        dx 64-65  . Heart Problems Maternal Uncle   . Breast cancer Maternal Grandmother 54  . Throat cancer Maternal Grandfather        d. 62s; smoker  . Sickle cell anemia Paternal Aunt   . Congestive Heart Failure Maternal Aunt   . Multiple sclerosis Cousin   . Cancer Other        maternal great uncle (MGM's brother); cancer removed from his side  . Heart attack Paternal Aunt        d. early 60s  The patient has very little information about her father. Her mother is currently 56 years old. She had a history of cervical cancer at age 95. The patient had 2 brothers, no sisters. The maternal grandfather had throat cancer. A maternal uncle was diagnosed with breast cancer as well as prostate cancer at the age of 33. 2 maternal cousins, one of them female, had breast cancer as well.   GYNECOLOGIC  HISTORY:  No LMP recorded. Menarche age 59, first live birth age 1. The patient is GX P4. She was still having regular periods at the time of diagnosis. She took oral contraceptives in the 1990s with no side effects.--.  Stopped with chemotherapy and have not resumed as of May 2019   SOCIAL HISTORY:  (Updated               ) She works on and off as a Market researcher. The patient's significant other [name] works at [employer].  At home with the patient are her 3 children [name], [place] and [place]. There are age 4, 91, and 41 as of November 2019.  2 of them are disabled or have significant health problems, one with sickle cell disease, the other with autism and developmental delay.  The patient's son [name], currently 27 years old, lives in [place].    ADVANCED DIRECTIVES: Not in place   HEALTH MAINTENANCE: Social History   Tobacco Use  . Smoking status: Former Smoker    Packs/day: 1.00    Years: 20.00    Pack years: 20.00    Types: Cigarettes    Quit date: 04/01/2018    Years since quitting: 2.6  . Smokeless tobacco: Never Used  . Tobacco comment: Patient has quit smoking x 1 year now  Vaping Use  . Vaping Use: Never used  Substance Use Topics  . Alcohol use: Yes    Comment: occ  . Drug use: No    Colonoscopy:  PAP:  Bone density:  Lipid panel:  No Known Allergies  Current Outpatient Medications on File Prior to Visit  Medication Sig Dispense Refill  . calcium carbonate (TUMS - DOSED IN MG ELEMENTAL CALCIUM) 500 MG chewable tablet Chew 1 tablet by mouth daily as needed for indigestion or heartburn.    Marland Kitchen LORazepam (ATIVAN) 0.5 MG tablet Take 1 tablet (0.5 mg total) by mouth daily as needed for anxiety. 30 tablet 0  . polyethylene glycol (MIRALAX / GLYCOLAX) 17 g packet Take 17 g by mouth daily. 14 each 0   No current facility-administered medications on file prior to visit.    OBJECTIVE: African-American woman who  appears stated age  29:   11/11/20 1305  BP: 114/70  Pulse: (!) 115  Resp: 18  Temp: 98.7 F (37.1 C)  SpO2: 100%   Wt Readings from Last 3 Encounters:  11/11/20 182 lb 12.8 oz (82.9 kg)  10/22/20 186 lb 14.4 oz (84.8 kg)  10/08/20 183 lb 14.4 oz (83.4 kg)   Body mass index is 35.7 kg/m.    ECOG FS: 1  Sclerae unicteric, EOMs intact Wearing a mask Lungs no rales or rhonchi Heart regular rate and rhythm Abd soft, nontender, positive bowel sounds Neuro: nonfocal, well oriented, appropriate affect Breasts: The right breast is status post mastectomy. There is a cavity in the right chest wall which is imaged below. The base shows some yellow some pink but there is no evidence of infection. The left breast shows at least 1 obvious cutaneous lesion which is not ulcerated. This is also imaged below.   Right breast 05/06/2020     Cutaneous mass in left breast 11/11/2020   Right chest wall cavity 11/11/2020    LAB RESULTS: CMP Latest Ref Rng & Units 11/11/2020 10/22/2020 10/15/2020  Glucose 70 - 99 mg/dL 85 93 105(H)  BUN 6 - 20 mg/dL _0 Creatinine 0.44 - 1.00 mg/dL 0.87 0.74 0.81  Sodium 135 - 145 mmol/L 141 144 142  Potassium 3.5 - 5.1 mmol/L 3.5 3.8 3.9  Chloride 98 - 111 mmol/L 103 105 105  CO2 22 - 32 mmol/L _1 Calcium 8.9 - 10.3 mg/dL 9.8 9.3 9.7  Total Protein 6.5 - 8.1 g/dL 8.0 7.3 7.7  Total Bilirubin 0.3 - 1.2 mg/dL 0.2(L) <0.2(L) 0.4  Alkaline Phos 38 - 126 U/L 67 65 75  AST 15 - 41 U/L 14(L) 12(L) 12(L)  ALT 0 - 44 U/L _2 CBC    Component Value Date/Time   WBC 18.0 (H) 11/11/2020 1246   RBC 3.40 (L) 11/11/2020 1247   RBC 3.44 (L) 11/11/2020 1246   HGB 9.5 (L) 11/11/2020 1246   HGB 9.4 (L) 08/07/2020 1129   HGB 10.4 (  L) 10/04/2016 1154   HCT 28.9 (L) 11/11/2020 1246   HCT 31.3 (L) 10/04/2016 1154   PLT 312 11/11/2020 1246   PLT 309 08/07/2020 1129   PLT 320 10/04/2016 1154   MCV 84.0 11/11/2020 1246   MCV 95.7 10/04/2016  1154   MCH 27.6 11/11/2020 1246   MCHC 32.9 11/11/2020 1246   RDW 18.7 (H) 11/11/2020 1246   RDW 16.3 (H) 10/04/2016 1154   LYMPHSABS 1.9 11/11/2020 1246   LYMPHSABS 1.7 10/04/2016 1154   MONOABS 1.6 (H) 11/11/2020 1246   MONOABS 0.5 10/04/2016 1154   EOSABS 0.1 11/11/2020 1246   EOSABS 0.3 10/04/2016 1154   BASOSABS 0.1 11/11/2020 1246   BASOSABS 0.0 10/04/2016 1154    STUDIES: No results found.   RESEARCH: Referred to PREVENT study, but was pregnant at the time; referred to [facility]02, but the biopsied lymph node was benign; referred to weight loss study but declined; referred to [facility] 12/09/2000, but the timing of radiation and 8 was greater than 60 days past the date of diagnosis; referred to health disparity study, but declined; referred to [id number] adjuvant therapy study, but the patient received Xeloda with radiation and therefore was ineligible. Referred to [facility] October 2021 for consideration of studies but did not fit any protocols there at that time   ASSESSMENT: 44 y.o. [place] woman status post right breast upper-outer quadrant biopsy 01/28/2016 for a clinical T2 N0 invasive ductal carcinoma, grade 3, triple negative, with an MIB-1 of 70%.  (a) suspicious right axillary lymph node biopsied 01/28/2016 was benign  (1) neoadjuvant chemotherapy: doxorubicin and cyclophosphamide in dose dense fashion 4 started 04/14/16, completed 05/26/2016, followed by paclitaxel and carboplatin weekly 12, Started 06/09/2016  (a) taxol discontinued after 7 doses because of neuropathy, last dose 07/21/2016  (2) genetics testing October 20, 2016 through the 32-gene Comprehensive Cancer Panel offered by [facility] Junius Roads, MD) (with MSH2 Exons 1-7 Inversion Analysis) found no deleterious mutations or VUSS  In APC, ATM, AXIN2, BARD1, BMPR1A, BRCA1, BRCA2, BRIP1, CDH1, CDK4, CDKN2A, CHEK2, EPCAM, FANCC, MLH1, MSH2, MSH6, MUTYH, NBN, PALB2, PMS2, POLD1,  POLE, PTEN, RAD51C, RAD51D, SCG5/GREM1, SMAD4, STK11, TP53, VHL, and XRCC2.    (3) right mastectomy and sentinel lymph node sampling 09/06/2016 showed a residual ypT1c ypN0, invasive ductal carcinoma, grade 3, with negative margins. Repeat prognostic panel again triple negative   (4) adjuvant radiation with capecitabine/Xeloda sensitization 11/15/16 - 01/12/17 Site/dose:   Right Chest Wall and axilla (4 field) treated to 45 Gy in 25 fractions, and then Boosted an additional 14.4 Gy in 8 fractions.  (5) tobacco abuse disorder: The patient quit smoking 04/04/2018  METASTATIC DISEASE:  July 2020: chest wall, bones, nodes (6) nonspecific changes noted on chest CT 06/11/2019 were clarified by PET scan 06/19/2019 showing hypermetabolic disease in the right anterior chest wall, right internal mammary nodes, right and left axillary nodes, but no metastatic disease in the neck, lungs, abdomen or pelvis.  Bone marrow uptake suggests bony metastatic disease.  (a) CARIS requested obtained from 06/28/2019 sample confirmed a triple negativity, the tumor also was negative for the androgen receptor, was MSI stable and mismatch repair status proficient, with a low mutational burden.  BRCA 1 and 2 were negative and PD-L1 was negative.  PIK3 showed a variant of uncertain significance.  However the tumor was genomic LOH high  (b) CA-27-29 is informative: was 118.3 on 06/04/2019  (7) zoledronate started 07/17/2019--on hold currently due to dental issues, and until  6 weeks at least after dental work  (8) carboplatin/ gemcitabine days 1 and 8 Q21 day cycle started 07/10/2019, last dose 10/30/2019  (a) restaging studies 09/2019 showed no progression  (b) restaging studies after 6 cycles showed mild disease progression  (9) cyclophosphamide, methotrexate, fluorouracil chemotherapy started 11/13/2019, repeated every 21 days.  (a) discontinued after cycle 3 (12/31/2019): No evidence of response  (10) started capecitabine  01/21/2020, given 1 week on and 1 week off at standard doses (1500 mg twice daily)   (a) Discontinued after almost three months of treatment (last on 04/06/2020)  (b) Disease progression on 4/27 CT scan showing increasing right chest wall disease, left breast lesions, and ? Liver involvement.  (c) MRI liver 04/14/2020 shows no liver invovlement  (11) Started liposomal doxorubicin/Doxil given on day 1 of a 21 day cycle on 04/07/2020  (a) echo on 04/02/2020 shows EF of 50-55%, repeat in 06/2020  (b) chest CT scan after 4 cycles of Doxil shows some evidence of progression in left axillary and right internal mammary adenopathy  (c) Doxil discontinued after 06/09/2020 dose  (12) started sacituzumab govitecan/ Trodelvy 07/10/2020  (a) day 8 cycle 2 omitted due to febrile neutropenia requiring admission  (b) changed to every two week dosing with Udenyca support after cycle 2  (c) discontinued after 09/24/2020 dose with evidence of disease progression  (13) started navelbine 10/15/2020, to be repeated days 1 and 8 of each 21-day cycle  (a) chest CT scan 10/06/2020 is new baseline study for measurable disease  (b) discontinued after one cycle at patient's request (not tolerated)   PLAN: Jalacia is in essentially uncontrolled pain. She has a large chest wall wound on the right, which is not infected at present. There are also tumor nodules particularly the very large 1 in the left breast.  We have given her many different types of chemotherapy and this cancer has been able to grow right through them. I have no evidence of any response. I think the likelihood of response from any treatment at this point is very low.. I discussed this with [name] and I offered her best supportive/comfort care and a hospice referral. She is very clear that she wants to continue to be treated. We discussed her regulant and she will receive her first dose tomorrow  She understands I am very concerned about peripheral neuropathy  and we will watch this very closely.  For her pain I am starting her on methadone 5 mg every 8 hours. She will use the OxyIR up to every 6 hours as needed for breakthrough pain control. This will require close monitoring during initial titration so I am going to see her again in 1 week and at that point depending on how she is doing we may consider going up to 7-1/2 mg 3 times a day on the methadone. We will also review bowel prophylaxis so that is not an issue at this point.  Today the patient tells me she intends to name her mother is healthcare power of attorney. I gave her the appropriate documents to complete  Total encounter time 45 minutes.Sarajane Jews C. Joy Haegele, MD 11/11/20 3:22 PM Medical Oncology and Hematology [facility] [place], [place] [address] Tel. [phone]    Fax. [phone]   I, Wilburn Mylar, am acting as scribe for Dr. [name]. Blue Winther.  I, Lurline Del MD, have reviewed the above documentation for accuracy and completeness, and I agree with the above.   *  Total Encounter Time as defined by the Centers for Medicare and Medicaid Services includes, in addition to the face-to-face time of a patient visit (documented in the note above) non-face-to-face time: obtaining and reviewing outside history, ordering and reviewing medications, tests or procedures, care coordination (communications with other health care professionals or caregivers) and documentation in the medical record.

## 2020-11-11 ENCOUNTER — Inpatient Hospital Stay: Payer: Medicaid Other

## 2020-11-11 ENCOUNTER — Inpatient Hospital Stay (HOSPITAL_BASED_OUTPATIENT_CLINIC_OR_DEPARTMENT_OTHER): Payer: Medicaid Other | Admitting: Oncology

## 2020-11-11 ENCOUNTER — Telehealth: Payer: Self-pay | Admitting: Oncology

## 2020-11-11 ENCOUNTER — Other Ambulatory Visit: Payer: Self-pay

## 2020-11-11 VITALS — BP 114/70 | HR 115 | Temp 98.7°F | Resp 18 | Ht 60.0 in | Wt 182.8 lb

## 2020-11-11 DIAGNOSIS — C50411 Malignant neoplasm of upper-outer quadrant of right female breast: Secondary | ICD-10-CM | POA: Diagnosis not present

## 2020-11-11 DIAGNOSIS — Z9011 Acquired absence of right breast and nipple: Secondary | ICD-10-CM | POA: Diagnosis not present

## 2020-11-11 DIAGNOSIS — C779 Secondary and unspecified malignant neoplasm of lymph node, unspecified: Secondary | ICD-10-CM | POA: Diagnosis not present

## 2020-11-11 DIAGNOSIS — Z5112 Encounter for antineoplastic immunotherapy: Secondary | ICD-10-CM | POA: Diagnosis not present

## 2020-11-11 DIAGNOSIS — K219 Gastro-esophageal reflux disease without esophagitis: Secondary | ICD-10-CM | POA: Diagnosis not present

## 2020-11-11 DIAGNOSIS — C50919 Malignant neoplasm of unspecified site of unspecified female breast: Secondary | ICD-10-CM | POA: Diagnosis not present

## 2020-11-11 DIAGNOSIS — Z170 Estrogen receptor positive status [ER+]: Secondary | ICD-10-CM | POA: Diagnosis not present

## 2020-11-11 DIAGNOSIS — Z809 Family history of malignant neoplasm, unspecified: Secondary | ICD-10-CM | POA: Diagnosis not present

## 2020-11-11 DIAGNOSIS — C7951 Secondary malignant neoplasm of bone: Secondary | ICD-10-CM

## 2020-11-11 DIAGNOSIS — Z923 Personal history of irradiation: Secondary | ICD-10-CM | POA: Diagnosis not present

## 2020-11-11 DIAGNOSIS — G893 Neoplasm related pain (acute) (chronic): Secondary | ICD-10-CM | POA: Diagnosis not present

## 2020-11-11 DIAGNOSIS — D571 Sickle-cell disease without crisis: Secondary | ICD-10-CM | POA: Diagnosis not present

## 2020-11-11 DIAGNOSIS — I10 Essential (primary) hypertension: Secondary | ICD-10-CM | POA: Diagnosis not present

## 2020-11-11 DIAGNOSIS — Z171 Estrogen receptor negative status [ER-]: Secondary | ICD-10-CM

## 2020-11-11 DIAGNOSIS — C7989 Secondary malignant neoplasm of other specified sites: Secondary | ICD-10-CM | POA: Diagnosis not present

## 2020-11-11 DIAGNOSIS — Z800 Family history of malignant neoplasm of digestive organs: Secondary | ICD-10-CM | POA: Diagnosis not present

## 2020-11-11 DIAGNOSIS — F418 Other specified anxiety disorders: Secondary | ICD-10-CM | POA: Diagnosis not present

## 2020-11-11 DIAGNOSIS — Z803 Family history of malignant neoplasm of breast: Secondary | ICD-10-CM | POA: Diagnosis not present

## 2020-11-11 DIAGNOSIS — Z87891 Personal history of nicotine dependence: Secondary | ICD-10-CM | POA: Diagnosis not present

## 2020-11-11 LAB — CBC WITH DIFFERENTIAL/PLATELET
Abs Immature Granulocytes: 0.12 10*3/uL — ABNORMAL HIGH (ref 0.00–0.07)
Basophils Absolute: 0.1 10*3/uL (ref 0.0–0.1)
Basophils Relative: 0 %
Eosinophils Absolute: 0.1 10*3/uL (ref 0.0–0.5)
Eosinophils Relative: 1 %
HCT: 28.9 % — ABNORMAL LOW (ref 36.0–46.0)
Hemoglobin: 9.5 g/dL — ABNORMAL LOW (ref 12.0–15.0)
Immature Granulocytes: 1 %
Lymphocytes Relative: 11 %
Lymphs Abs: 1.9 10*3/uL (ref 0.7–4.0)
MCH: 27.6 pg (ref 26.0–34.0)
MCHC: 32.9 g/dL (ref 30.0–36.0)
MCV: 84 fL (ref 80.0–100.0)
Monocytes Absolute: 1.6 10*3/uL — ABNORMAL HIGH (ref 0.1–1.0)
Monocytes Relative: 9 %
Neutro Abs: 14.2 10*3/uL — ABNORMAL HIGH (ref 1.7–7.7)
Neutrophils Relative %: 78 %
Platelets: 312 10*3/uL (ref 150–400)
RBC: 3.44 MIL/uL — ABNORMAL LOW (ref 3.87–5.11)
RDW: 18.7 % — ABNORMAL HIGH (ref 11.5–15.5)
WBC: 18 10*3/uL — ABNORMAL HIGH (ref 4.0–10.5)
nRBC: 0 % (ref 0.0–0.2)

## 2020-11-11 LAB — COMPREHENSIVE METABOLIC PANEL
ALT: 6 U/L (ref 0–44)
AST: 14 U/L — ABNORMAL LOW (ref 15–41)
Albumin: 3.5 g/dL (ref 3.5–5.0)
Alkaline Phosphatase: 67 U/L (ref 38–126)
Anion gap: 11 (ref 5–15)
BUN: 6 mg/dL (ref 6–20)
CO2: 27 mmol/L (ref 22–32)
Calcium: 9.8 mg/dL (ref 8.9–10.3)
Chloride: 103 mmol/L (ref 98–111)
Creatinine, Ser: 0.87 mg/dL (ref 0.44–1.00)
GFR, Estimated: >60 mL/min (ref >60)
Glucose, Bld: 85 mg/dL (ref 70–99)
Potassium: 3.5 mmol/L (ref 3.5–5.1)
Sodium: 141 mmol/L (ref 135–145)
Total Bilirubin: 0.2 mg/dL — ABNORMAL LOW (ref 0.3–1.2)
Total Protein: 8 g/dL (ref 6.5–8.1)

## 2020-11-11 LAB — FERRITIN: Ferritin: 701 ng/mL — ABNORMAL HIGH (ref 11–307)

## 2020-11-11 LAB — RETICULOCYTES
Immature Retic Fract: 28.6 % — ABNORMAL HIGH (ref 2.3–15.9)
RBC.: 3.4 MIL/uL — ABNORMAL LOW (ref 3.87–5.11)
Retic Count, Absolute: 44.5 10*3/uL (ref 19.0–186.0)
Retic Ct Pct: 1.3 % (ref 0.4–3.1)

## 2020-11-11 MED ORDER — METHADONE HCL 5 MG PO TABS
5.0000 mg | ORAL_TABLET | Freq: Three times a day (TID) | ORAL | 0 refills | Status: DC
Start: 2020-11-11 — End: 2020-12-11

## 2020-11-11 MED ORDER — OXYCODONE HCL 5 MG PO TABS: 5.0000 mg | ORAL_TABLET | Freq: Four times a day (QID) | ORAL | 0 refills | Status: DC | PRN

## 2020-11-11 NOTE — Progress Notes (Signed)
DISCONTINUE ON PATHWAY REGIMEN - Breast     A cycle is every 21 days:     Vinorelbine   **Always confirm dose/schedule in your pharmacy ordering system**  REASON: Toxicities / Adverse Event PRIOR TREATMENT: [id number]: Vinorelbine 25 mg/m2 D1, 8 q21 Days TREATMENT RESPONSE: Unable to Evaluate  START ON PATHWAY REGIMEN - Breast     A cycle is every 21 days:     Eribulin mesylate   **Always confirm dose/schedule in your pharmacy ordering system**  Patient Characteristics: Distant Metastases or Locoregional Recurrent Disease - Unresected or Locally Advanced Unresectable Disease Progressing after Neoadjuvant and Local Therapies, HER2 Negative/Unknown/Equivocal, ER Negative/Unknown, Chemotherapy, Third Line and Beyond,  Eribulin Candidate Therapeutic Status: Distant Metastases ER Status: Negative (-) HER2 Status: Negative (-) PR Status: Negative (-) Therapy Approach Indicated: Standard Chemotherapy/Endocrine Therapy Line of Therapy: Third Line and Beyond Intent of Therapy: Non-Curative / Palliative Intent, Discussed with Patient

## 2020-11-11 NOTE — Telephone Encounter (Signed)
Scheduled appointments per 12/8 los. Spoke to patient who is aware of appointments dates and times.

## 2020-11-12 ENCOUNTER — Other Ambulatory Visit: Payer: Self-pay

## 2020-11-12 ENCOUNTER — Inpatient Hospital Stay: Payer: Medicaid Other

## 2020-11-12 ENCOUNTER — Ambulatory Visit: Payer: Medicaid Other

## 2020-11-12 VITALS — BP 113/85 | HR 130 | Temp 99.8°F | Resp 18 | Ht 60.0 in | Wt 181.8 lb

## 2020-11-12 DIAGNOSIS — T451X5A Adverse effect of antineoplastic and immunosuppressive drugs, initial encounter: Secondary | ICD-10-CM

## 2020-11-12 DIAGNOSIS — Z171 Estrogen receptor negative status [ER-]: Secondary | ICD-10-CM

## 2020-11-12 DIAGNOSIS — G620 Drug-induced polyneuropathy: Secondary | ICD-10-CM

## 2020-11-12 DIAGNOSIS — Z5112 Encounter for antineoplastic immunotherapy: Secondary | ICD-10-CM | POA: Diagnosis not present

## 2020-11-12 DIAGNOSIS — C50919 Malignant neoplasm of unspecified site of unspecified female breast: Secondary | ICD-10-CM

## 2020-11-12 DIAGNOSIS — C7951 Secondary malignant neoplasm of bone: Secondary | ICD-10-CM

## 2020-11-12 DIAGNOSIS — Z7189 Other specified counseling: Secondary | ICD-10-CM

## 2020-11-12 DIAGNOSIS — C50411 Malignant neoplasm of upper-outer quadrant of right female breast: Secondary | ICD-10-CM

## 2020-11-12 LAB — CANCER ANTIGEN 27.29: CA 27.29: 297.2 U/mL — ABNORMAL HIGH (ref 0.0–38.6)

## 2020-11-12 MED ORDER — SODIUM CHLORIDE 0.9 % IV SOLN
1.4000 mg/m2 | Freq: Once | INTRAVENOUS | Status: AC
  Administered 2020-11-12: 2.75 mg via INTRAVENOUS
  Filled 2020-11-12: qty 5.5

## 2020-11-12 MED ORDER — SODIUM CHLORIDE 0.9 % IV SOLN
Freq: Once | INTRAVENOUS | Status: AC
  Filled 2020-11-12: qty 250

## 2020-11-12 MED ORDER — SODIUM CHLORIDE 0.9 % IV SOLN
INTRAVENOUS | Status: AC
  Filled 2020-11-12: qty 250

## 2020-11-12 MED ORDER — PROCHLORPERAZINE MALEATE 10 MG PO TABS
10.0000 mg | ORAL_TABLET | Freq: Once | ORAL | Status: AC
  Administered 2020-11-12: 10 mg via ORAL

## 2020-11-12 MED ORDER — HEPARIN SOD (PORK) LOCK FLUSH 100 UNIT/ML IV SOLN
500.0000 [IU] | Freq: Once | INTRAVENOUS | Status: AC | PRN
  Administered 2020-11-12: 500 [IU]
  Filled 2020-11-12: qty 5

## 2020-11-12 MED ORDER — PROCHLORPERAZINE MALEATE 10 MG PO TABS
ORAL_TABLET | ORAL | Status: AC
  Filled 2020-11-12: qty 1

## 2020-11-12 MED ORDER — SODIUM CHLORIDE 0.9% FLUSH
10.0000 mL | INTRAVENOUS | Status: DC | PRN
  Administered 2020-11-12: 10 mL
  Filled 2020-11-12: qty 10

## 2020-11-12 NOTE — Progress Notes (Signed)
Per Dr. [name]: okay to treat with HR of 130. Administer 250cc of NS along with treatment.

## 2020-11-12 NOTE — Patient Instructions (Signed)
[place] Discharge Instructions for Patients Receiving Chemotherapy  Today you received the following chemotherapy agents Eribulin mesylate (HALAVEN).  To help prevent nausea and vomiting after your treatment, we encourage you to take your nausea medication as prescribed.   If you develop nausea and vomiting that is not controlled by your nausea medication, call the clinic.   BELOW ARE SYMPTOMS THAT SHOULD BE REPORTED IMMEDIATELY:  *FEVER GREATER THAN 100.5 F  *CHILLS WITH OR WITHOUT FEVER  NAUSEA AND VOMITING THAT IS NOT CONTROLLED WITH YOUR NAUSEA MEDICATION  *UNUSUAL SHORTNESS OF BREATH  *UNUSUAL BRUISING OR BLEEDING  TENDERNESS IN MOUTH AND THROAT WITH OR WITHOUT PRESENCE OF ULCERS  *URINARY PROBLEMS  *BOWEL PROBLEMS  UNUSUAL RASH Items with * indicate a potential emergency and should be followed up as soon as possible.  Feel free to call the clinic should you have any questions or concerns. The clinic phone number is [phone].  Please show the [facility] at check-in to the Emergency Department and triage nurse.  Eribulin solution for injection What is this medicine? ERIBULIN (er e bu lin) is a chemotherapy drug. It is used to treat breast cancer and liposarcoma. This medicine may be used for other purposes; ask your health care provider or pharmacist if you have questions. COMMON BRAND NAME(S): Halaven What should I tell my health care provider before I take this medicine? They need to know if you have any of these conditions:  heart disease  history of irregular heartbeat  kidney disease  liver disease  low blood counts, like low white cell, platelet, or red cell counts  low levels of potassium or magnesium in the blood  an unusual or allergic reaction to eribulin, other medicines, foods, dyes, or preservatives  pregnant or trying to get pregnant  breast-feeding How should I use this medicine? This medicine is for infusion  into a vein. It is given by a health care professional in a hospital or clinic setting. Talk to your pediatrician regarding the use of this medicine in children. Special care may be needed. Overdosage: If you think you have taken too much of this medicine contact a poison control center or emergency room at once. NOTE: This medicine is only for you. Do not share this medicine with others. What if I miss a dose? It is important not to miss your dose. Call your doctor or health care professional if you are unable to keep an appointment. What may interact with this medicine? Do not take this medicine with any of the following medications:  amiodarone  astemizole  arsenic trioxide  bepridil  bretylium  chloroquine  chlorpromazine  cisapride  clarithromycin  dextromethorphan, quinidine  disopyramide  droperidol  dronedarone  erythromycin  grepafloxacin  halofantrine  haloperidol  ibutilide  levomethadyl  mesoridazine  methadone  pentamidine  procainamide  quinidine  pimozide  posaconazole  probucol  propafenone  saquinavir  sotalol  sparfloxacin  terfenadine  thioridazine  troleandomycin This medicine may also interact with the following medications:  dofetilide  ziprasidone This list may not describe all possible interactions. Give your health care provider a list of all the medicines, herbs, non-prescription drugs, or dietary supplements you use. Also tell them if you smoke, drink alcohol, or use illegal drugs. Some items may interact with your medicine. What should I watch for while using this medicine? This drug may make you feel generally unwell. This is not uncommon, as chemotherapy can affect healthy cells as well as cancer  cells. Report any side effects. Continue your course of treatment even though you feel ill unless your doctor tells you to stop. Call your doctor or health care professional for advice if you get a fever, chills  or sore throat, or other symptoms of a cold or flu. Do not treat yourself. This drug decreases your body's ability to fight infections. Try to avoid being around people who are sick. This medicine may increase your risk to bruise or bleed. Call your doctor or health care professional if you notice any unusual bleeding. You may need blood work done while you are taking this medicine. Do not become pregnant while taking this medicine or for 2 weeks after stopping it. Women should inform their doctor if they wish to become pregnant or think they might be pregnant. Men should not father a child while taking this medicine and for 3.5 months after stopping it. There is a potential for serious side effects to an unborn child. Talk to your health care professional or pharmacist for more information. Do not breast-feed an infant while taking this medicine or for 2 weeks after stopping it. What side effects may I notice from receiving this medicine? Side effects that you should report to your doctor or health care professional as soon as possible:  allergic reactions like skin rash, itching or hives, swelling of the face, lips, or tongue  low blood counts - this medicine may decrease the number of white blood cells, red blood cells and platelets. You may be at increased risk for infections and bleeding.  signs of infection - fever or chills, cough, sore throat, pain or difficulty passing urine  signs of decreased platelets or bleeding - bruising, pinpoint red spots on the skin, black, tarry stools, blood in the urine  signs of decreased red blood cells - unusually weak or tired, fainting spells, lightheadedness  pain, tingling, numbness in the hands or feet Side effects that usually do not require medical attention (report to your doctor or health care professional if they continue or are bothersome):  constipation  hair loss  headache  loss of appetite  muscle or joint pain  nausea,  vomiting  stomach pain This list may not describe all possible side effects. Call your doctor for medical advice about side effects. You may report side effects to FDA at 1-800-FDA-1088. Where should I keep my medicine? This drug is given in a hospital or clinic and will not be stored at home. NOTE: This sheet is a summary. It may not cover all possible information. If you have questions about this medicine, talk to your doctor, pharmacist, or health care provider.  2020 Elsevier/Gold Standard (2018-11-12 11:00:16)

## 2020-11-13 ENCOUNTER — Telehealth: Payer: Self-pay | Admitting: *Deleted

## 2020-11-13 NOTE — Telephone Encounter (Signed)
Called pt to see how she did with her new drug.  She reports doing will with no c/o's.  She also states she knows how to reach [place] with question/concerns.

## 2020-11-13 NOTE — Telephone Encounter (Signed)
-----   Message from Georgianne Fick, RN sent at 11/12/2020  4:55 PM EST ----- Regarding: Dr. [name] First Time Halaven Patient received first time Halaven and tolerated this well.

## 2020-11-18 NOTE — Progress Notes (Signed)
[facility]  [phone] [phone]   ID: Unknown Rebecca [name] DOB: Nov 05, 1976  MR#: [id number]  WLN#:[id number]  Patient Care Team: Dorena Dew, FNP as PCP - General (Family Medicine) Alphonsa Overall, MD as Consulting Physician (General Surgery) Magrinat, [name], MD as Consulting Physician (Oncology) Gery Pray, MD as Consulting Physician (Radiation Oncology) Delice Bison, Charlestine Massed, NP as Nurse Practitioner (Hematology and Oncology) Alda Berthold, DO as Consulting Physician (Neurology) OTHER MD:  CHIEF COMPLAINT: triple negative stage IV breast cancer  CURRENT TREATMENT: eribulin day 1 and day 8 every 21 day cycle   INTERVAL HISTORY: Rebecca [name] returns today for follow up and treatment of her triple negative metastatic breast cancer accompanied by her mother Rebecca [name].  She was switched to eribulin at her last visit on 11/12/2020.  Today is cycle 1 day 8.  We have also been following her CA 27-29: Lab Results  Component Value Date   CA2729 297.2 (H) 11/11/2020   CA2729 261.8 (H) 10/15/2020   CA2729 246.1 (H) 09/24/2020   CA2729 238.4 (H) 08/27/2020   CA2729 290.5 (H) 07/31/2020    REVIEW OF SYSTEMS: Rebecca [name] tolerated the first cycle of eribulin without any complications.  Currently she denies any peripheral neuropathy symptoms.  She has had no nausea, no vomiting, and no mouth sores.  She is constipated.  She says MiraLAX does not work for her.  She uses magnesium citrate as needed.  She was started on methadone at the last visit but she is taking it very irregularly.  She is taking it as a short acting medicine and she says if she takes it in the morning she does not really need to take it again until the next day.  However in between she has been using OxyIR.   COVID 19 VACCINATION STATUS: She received Moderna x2 most recently February 2021    BREAST CANCER HISTORY: From the original intake note:  Jochebed herself noted a change in her right  breast sometime around September or October 2016. She did not bring it to intermediate medical attention, but on 01/13/2016 she established herself in Dr. Smith Robert' service and she was set up for bilateral diagnostic mammography with tomosynthesis and bilateral ultrasonography at the [facility] 01/19/2016. The breast density was category B. In the upper outer quadrant of the right breast there was a spiculated mass measuring 2.8 cm. On physical exam this was palpable. Targeted ultrasonography confirmed an irregular hypoechoic mass in the right breast 11:30 o'clock position measuring 2.6 cm maximally. Ultrasound of the right axilla showed a morphologically abnormal lymph node.  In the left breast there were some tubular densities behind the areola which by ultrasonography showed benign ductal ectasia.  On 01/28/2016 [facility] underwent biopsy of the right breast mass and abnormal right axillary lymph node. The pathology from this procedure (S [id number]) showed the lymph node to be benign. In the breast however there was an invasive ductal carcinoma, grade 3, which was estrogen and progesterone receptor negative. The proliferation marker was 70%. HER-2 was not amplified with a signals ratio of 1.32. The number per cell was 2.05.  The patient's subsequent history is as detailed below   PAST MEDICAL HISTORY: Past Medical History:  Diagnosis Date  . Anxiety   . Breast cancer ([place])   . Depression   . GERD (gastroesophageal reflux disease)   . History of radiation therapy 11/15/16-01/12/17   right chest wall and axilla treated to 45 Gy in 25 fractions, boosted and  additional 14 Gy in 8 fractions  . Hypertension    diet controlled  . Obesity (BMI 35.0-39.9 without comorbidity)   . Pneumonia    as a child  . Seasonal allergies   . Sickle cell trait ([place])   . Termination of pregnancy (fetus) 04/02/16    PAST SURGICAL HISTORY: Past Surgical History:  Procedure Laterality Date  . CESAREAN SECTION      2004 and 2007  . MASTECTOMY W/ SENTINEL NODE BIOPSY Right 09/06/2016   Procedure: RIGHT BREAST MASTECTOMY WITH RIGHT AXILLARY SENTINEL LYMPH NODE BIOPSY;  Surgeon: Alphonsa Overall, MD;  Location: [facility];  Service: General;  Laterality: Right;  . PORT-A-CATH REMOVAL Left 09/06/2016   Procedure: REMOVAL PORT-A-CATH;  Surgeon: Alphonsa Overall, MD;  Location: [facility];  Service: General;  Laterality: Left;  . PORTACATH PLACEMENT    . PORTACATH PLACEMENT N/A 07/09/2019   Procedure: INSERTION PORT-A-CATH WITH ULTRASOUND;  Surgeon: Alphonsa Overall, MD;  [facility];  Service: General;  Laterality: N/A;    FAMILY HISTORY Family History  Problem Relation Age of Onset  . Hypertension Mother   . Cancer Mother        dx "intestinal cancer" in her 5s; +surgery  . Other Mother        hysterectomy at young age for unspecified cause  . Heart Problems Mother   . Breast cancer Cousin        maternal 1st cousin dx female breast cancer at 58-46y  . Cancer Father   . Hypertension Father   . Heart Problems Maternal Aunt   . Diabetes Maternal Aunt   . Breast cancer Maternal Uncle        dx 64-65  . Heart Problems Maternal Uncle   . Breast cancer Maternal Grandmother 74  . Throat cancer Maternal Grandfather        d. 78s; smoker  . Sickle cell anemia Paternal Aunt   . Congestive Heart Failure Maternal Aunt   . Multiple sclerosis Cousin   . Cancer Other        maternal great uncle (MGM's brother); cancer removed from his side  . Heart attack Paternal Aunt        d. early 83s  The patient has very little information about her father. Her mother is currently 15 years old. She had a history of cervical cancer at age 25. The patient had 2 brothers, no sisters. The maternal grandfather had throat cancer. A maternal uncle was diagnosed with breast cancer as well as prostate cancer at the age of 41. 2 maternal cousins, one of them female, had breast cancer as well.   GYNECOLOGIC  HISTORY:  No LMP recorded. Menarche age 54, first live birth age 34. The patient is GX P4. She was still having regular periods at the time of diagnosis. She took oral contraceptives in the 1990s with no side effects.--.  Stopped with chemotherapy and have not resumed as of May 2019   SOCIAL HISTORY:   She works on and off as a Market researcher. The patient's significant other Dwayne [name] works at [employer].  At home with the patient are her 3 children Rebecca [name], [place] and [place]. There are age 78, 75, and 47 as of November 2019.  2 of them are disabled or have significant health problems, one with sickle cell disease, the other with autism and developmental delay.  The patient's son [name], currently 37 years old, lives in  [place].    ADVANCED DIRECTIVES: Not in place; she intends to name her mother as her 8, appropriate documents given 11/11/2020   HEALTH MAINTENANCE: Social History   Tobacco Use  . Smoking status: Former Smoker    Packs/day: 1.00    Years: 20.00    Pack years: 20.00    Types: Cigarettes    Quit date: 04/01/2018    Years since quitting: 2.6  . Smokeless tobacco: Never Used  . Tobacco comment: Patient has quit smoking x 1 year now  Vaping Use  . Vaping Use: Never used  Substance Use Topics  . Alcohol use: Yes    Comment: occ  . Drug use: No    Colonoscopy:  PAP:  Bone density:  Lipid panel:  No Known Allergies  Current Outpatient Medications on File Prior to Visit  Medication Sig Dispense Refill  . calcium carbonate (TUMS - DOSED IN MG ELEMENTAL CALCIUM) 500 MG chewable tablet Chew 1 tablet by mouth daily as needed for indigestion or heartburn.    Marland Kitchen LORazepam (ATIVAN) 0.5 MG tablet Take 1 tablet (0.5 mg total) by mouth daily as needed for anxiety. 30 tablet 0  . methadone (DOLOPHINE) 5 MG tablet Take 1 tablet (5 mg total) by mouth every 8 (eight) hours. 30 tablet 0  . oxyCODONE (OXY IR/ROXICODONE) 5 MG  immediate release tablet Take 1 tablet (5 mg total) by mouth every 6 (six) hours as needed for severe pain. 60 tablet 0  . polyethylene glycol (MIRALAX / GLYCOLAX) 17 g packet Take 17 g by mouth daily. 14 each 0   No current facility-administered medications on file prior to visit.    OBJECTIVE: African-American woman who appears stated age  There were no vitals filed for this visit. Wt Readings from Last 3 Encounters:  11/12/20 181 lb 12 oz (82.4 kg)  11/11/20 182 lb 12.8 oz (82.9 kg)  10/22/20 186 lb 14.4 oz (84.8 kg)   There is no height or weight on file to calculate BMI.    ECOG FS: 1  Sclerae unicteric, EOMs intact Wearing a mask No cervical or supraclavicular adenopathy Lungs no rales or rhonchi Heart regular rate and rhythm Abd soft, nontender, positive bowel sounds MSK no focal spinal tenderness, no upper extremity lymphedema Neuro: nonfocal, well oriented, appropriate affect Breasts: Deferred   Right breast chest wall area 05/06/2020     Cutaneous mass in left breast 11/11/2020   Right chest wall cavity 11/11/2020    LAB RESULTS: CMP Latest Ref Rng & Units 11/11/2020 10/22/2020 10/15/2020  Glucose 70 - 99 mg/dL 85 93 105(H)  BUN 6 - 20 mg/dL 6 8 6   Creatinine 0.44 - 1.00 mg/dL 0.87 0.74 0.81  Sodium 135 - 145 mmol/L 141 144 142  Potassium 3.5 - 5.1 mmol/L 3.5 3.8 3.9  Chloride 98 - 111 mmol/L 103 105 105  CO2 22 - 32 mmol/L 27 30 28   Calcium 8.9 - 10.3 mg/dL 9.8 9.3 9.7  Total Protein 6.5 - 8.1 g/dL 8.0 7.3 7.7  Total Bilirubin 0.3 - 1.2 mg/dL 0.2(L) <0.2(L) 0.4  Alkaline Phos 38 - 126 U/L 67 65 75  AST 15 - 41 U/L 14(L) 12(L) 12(L)  ALT 0 - 44 U/L 6 9 7     CBC    Component Value Date/Time   WBC 18.0 (H) 11/11/2020 1246   RBC 3.40 (L) 11/11/2020 1247   RBC 3.44 (L) 11/11/2020 1246   HGB 9.5 (L) 11/11/2020 1246   HGB 9.4 (L)  08/07/2020 1129   HGB 10.4 (L) 10/04/2016 1154   HCT 28.9 (L) 11/11/2020 1246   HCT 31.3 (L) 10/04/2016 1154   PLT 312  11/11/2020 1246   PLT 309 08/07/2020 1129   PLT 320 10/04/2016 1154   MCV 84.0 11/11/2020 1246   MCV 95.7 10/04/2016 1154   MCH 27.6 11/11/2020 1246   MCHC 32.9 11/11/2020 1246   RDW 18.7 (H) 11/11/2020 1246   RDW 16.3 (H) 10/04/2016 1154   LYMPHSABS 1.9 11/11/2020 1246   LYMPHSABS 1.7 10/04/2016 1154   MONOABS 1.6 (H) 11/11/2020 1246   MONOABS 0.5 10/04/2016 1154   EOSABS 0.1 11/11/2020 1246   EOSABS 0.3 10/04/2016 1154   BASOSABS 0.1 11/11/2020 1246   BASOSABS 0.0 10/04/2016 1154    STUDIES: No results found.   RESEARCH: Referred to PREVENT study, but was pregnant at the time; referred to [facility] 11202, but the biopsied lymph node was benign; referred to weight loss study but declined; referred to [facility] 12/09/2000, but the timing of radiation and 8 was greater than 60 days past the date of diagnosis; referred to health disparity study, but declined; referred to [id number] adjuvant therapy study, but the patient received Xeloda with radiation and therefore was ineligible. Referred to [facility] October 2021 for consideration of studies but did not fit any protocols there at that time   ASSESSMENT: 44 y.o. [place] woman status post right breast upper-outer quadrant biopsy 01/28/2016 for a clinical T2 N0 invasive ductal carcinoma, grade 3, triple negative, with an MIB-1 of 70%.  (a) suspicious right axillary lymph node biopsied 01/28/2016 was benign  (1) neoadjuvant chemotherapy: doxorubicin and cyclophosphamide in dose dense fashion 4 started 04/14/16, completed 05/26/2016, followed by paclitaxel and carboplatin weekly 12, Started 06/09/2016  (a) taxol discontinued after 7 doses because of neuropathy, last dose 07/21/2016  (2) genetics testing October 20, 2016 through the 32-gene Comprehensive Cancer Panel offered by [facility] Junius Roads, MD) (with MSH2 Exons 1-7 Inversion Analysis) found no deleterious mutations or VUSS  In APC, ATM, AXIN2, BARD1,  BMPR1A, BRCA1, BRCA2, BRIP1, CDH1, CDK4, CDKN2A, CHEK2, EPCAM, FANCC, MLH1, MSH2, MSH6, MUTYH, NBN, PALB2, PMS2, POLD1, POLE, PTEN, RAD51C, RAD51D, SCG5/GREM1, SMAD4, STK11, TP53, VHL, and XRCC2.    (3) right mastectomy and sentinel lymph node sampling 09/06/2016 showed a residual ypT1c ypN0, invasive ductal carcinoma, grade 3, with negative margins. Repeat prognostic panel again triple negative   (4) adjuvant radiation with capecitabine/Xeloda sensitization 11/15/16 - 01/12/17 Site/dose:   Right Chest Wall and axilla (4 field) treated to 45 Gy in 25 fractions, and then Boosted an additional 14.4 Gy in 8 fractions.  (5) tobacco abuse disorder: The patient quit smoking 04/04/2018  METASTATIC DISEASE:  July 2020: chest wall, bones, nodes (6) nonspecific changes noted on chest CT 06/11/2019 were clarified by PET scan 06/19/2019 showing hypermetabolic disease in the right anterior chest wall, right internal mammary nodes, right and left axillary nodes, but no metastatic disease in the neck, lungs, abdomen or pelvis.  Bone marrow uptake suggests bony metastatic disease.  (a) CARIS requested obtained from 06/28/2019 sample confirmed a triple negativity, the tumor also was negative for the androgen receptor, was MSI stable and mismatch repair status proficient, with a low mutational burden.  BRCA 1 and 2 were negative and PD-L1 was negative.  PIK3 showed a variant of uncertain significance.  However the tumor was genomic LOH high  (b) CA-27-29 is informative: was 118.3 on 06/04/2019  (7) zoledronate started 07/17/2019--on hold currently  due to dental issues, and until 6 weeks at least after dental work  (8) carboplatin/ gemcitabine days 1 and 8 Q21 day cycle started 07/10/2019, last dose 10/30/2019  (a) restaging studies 09/2019 showed no progression  (b) restaging studies after 6 cycles showed mild disease progression  (9) cyclophosphamide, methotrexate, fluorouracil chemotherapy started 11/13/2019,  repeated every 21 days.  (a) discontinued after cycle 3 (12/31/2019): No evidence of response  (10) started capecitabine 01/21/2020, given 1 week on and 1 week off at standard doses (1500 mg twice daily)   (a) Discontinued after almost three months of treatment (last on 04/06/2020)  (b) Disease progression on 4/27 CT scan showing increasing right chest wall disease, left breast lesions, and ? Liver involvement.  (c) MRI liver 04/14/2020 shows no liver invovlement  (11) Started liposomal doxorubicin/Doxil given on day 1 of a 21 day cycle on 04/07/2020  (a) echo on 04/02/2020 shows EF of 50-55%, repeat in 06/2020  (b) chest CT scan after 4 cycles of Doxil shows some evidence of progression in left axillary and right internal mammary adenopathy  (c) Doxil discontinued after 06/09/2020 dose  (12) started sacituzumab govitecan/ Trodelvy 07/10/2020  (a) day 8 cycle 2 omitted due to febrile neutropenia requiring admission  (b) changed to every two week dosing with Udenyca support after cycle 2  (c) discontinued after 09/24/2020 dose with evidence of disease progression  (13) started navelbine 10/15/2020, to be repeated days 1 and 8 of each 21-day cycle  (a) chest CT scan 10/06/2020 is new baseline study for measurable disease  (b) discontinued after one cycle at patient's request (not tolerated)  (14) started eribulin 11/12/2020, repeated days 1 and 8 of each 21-day cycle  (15) pain management:  (a) methadone started 11/12/2020, 5 mg p.o. every 8 hours  (b) may take Tylenol Aleve or other NSAIDs for breakthrough pain  (c) bowel prophylaxis: Currently using magnesium citrate irregularly   PLAN: Ashna tolerated the first dose of eribulin without any unusual side effects and she will receive the day 8 treatment today.  I am setting her up for the next 2 cycles with visits on day 1 of each cycle.  At the last visit she complained of on and durable pain.  We started her on methadone.  Today she tells  me that if she takes methadone in the morning she really does not need to take any medication until next day for pain.  On the other hand sometimes she uses an [place].  Our goal is to build up the methadone as needed so the plan will be for her to take methadone every morning 5 mg.  If she has pain later in the day she will take a second methadone, the maximum at this point being 5 mg 3 times a day.  If she has pain too soon after the first methadone dose she will use nonsteroidals and Tylenol.  I am discontinuing the OxyIR at this point  She is not on a regular bowel prophylaxis regimen.  We suggested MiraLAX and stool softeners but she does not find those worked well for her.  Currently she is using magnesium citrate on an as-needed basis.  We can discuss this further at subsequent visits  After she has 4 cycles of the current treatment she will be completely restaged  Total encounter time 25 minutes.Sarajane Jews C. Magrinat, MD 11/18/20 10:50 AM Medical Oncology and Hematology [facility] [place], [place] [address] Tel. [phone]  Fax. [phone]   I, Wilburn Mylar, am acting as scribe for Dr. [name]. Magrinat.  I, Lurline Del MD, have reviewed the above documentation for accuracy and completeness, and I agree with the above.   *Total Encounter Time as defined by the Centers for Medicare and Medicaid Services includes, in addition to the face-to-face time of a patient visit (documented in the note above) non-face-to-face time: obtaining and reviewing outside history, ordering and reviewing medications, tests or procedures, care coordination (communications with other health care professionals or caregivers) and documentation in the medical record.

## 2020-11-19 ENCOUNTER — Inpatient Hospital Stay: Payer: Medicaid Other

## 2020-11-19 ENCOUNTER — Other Ambulatory Visit: Payer: Self-pay

## 2020-11-19 ENCOUNTER — Inpatient Hospital Stay (HOSPITAL_BASED_OUTPATIENT_CLINIC_OR_DEPARTMENT_OTHER): Payer: Medicaid Other | Admitting: Oncology

## 2020-11-19 VITALS — BP 122/80 | HR 100 | Temp 98.2°F | Resp 18 | Ht 60.0 in | Wt 181.9 lb

## 2020-11-19 DIAGNOSIS — C50411 Malignant neoplasm of upper-outer quadrant of right female breast: Secondary | ICD-10-CM

## 2020-11-19 DIAGNOSIS — C50919 Malignant neoplasm of unspecified site of unspecified female breast: Secondary | ICD-10-CM | POA: Diagnosis not present

## 2020-11-19 DIAGNOSIS — Z5112 Encounter for antineoplastic immunotherapy: Secondary | ICD-10-CM | POA: Diagnosis not present

## 2020-11-19 DIAGNOSIS — C7951 Secondary malignant neoplasm of bone: Secondary | ICD-10-CM

## 2020-11-19 DIAGNOSIS — G620 Drug-induced polyneuropathy: Secondary | ICD-10-CM

## 2020-11-19 DIAGNOSIS — Z171 Estrogen receptor negative status [ER-]: Secondary | ICD-10-CM

## 2020-11-19 DIAGNOSIS — T451X5A Adverse effect of antineoplastic and immunosuppressive drugs, initial encounter: Secondary | ICD-10-CM

## 2020-11-19 DIAGNOSIS — Z7189 Other specified counseling: Secondary | ICD-10-CM

## 2020-11-19 LAB — CBC WITH DIFFERENTIAL/PLATELET
Abs Immature Granulocytes: 0.2 10*3/uL — ABNORMAL HIGH (ref 0.00–0.07)
Basophils Absolute: 0.1 10*3/uL (ref 0.0–0.1)
Basophils Relative: 3 %
Eosinophils Absolute: 0 10*3/uL (ref 0.0–0.5)
Eosinophils Relative: 1 %
HCT: 26.1 % — ABNORMAL LOW (ref 36.0–46.0)
Hemoglobin: 8.3 g/dL — ABNORMAL LOW (ref 12.0–15.0)
Immature Granulocytes: 6 %
Lymphocytes Relative: 34 %
Lymphs Abs: 1.1 10*3/uL (ref 0.7–4.0)
MCH: 27.4 pg (ref 26.0–34.0)
MCHC: 31.8 g/dL (ref 30.0–36.0)
MCV: 86.1 fL (ref 80.0–100.0)
Monocytes Absolute: 0.4 10*3/uL (ref 0.1–1.0)
Monocytes Relative: 12 %
Neutro Abs: 1.4 10*3/uL — ABNORMAL LOW (ref 1.7–7.7)
Neutrophils Relative %: 44 %
Platelets: 226 10*3/uL (ref 150–400)
RBC: 3.03 MIL/uL — ABNORMAL LOW (ref 3.87–5.11)
RDW: 18.9 % — ABNORMAL HIGH (ref 11.5–15.5)
WBC: 3.1 10*3/uL — ABNORMAL LOW (ref 4.0–10.5)
nRBC: 0 % (ref 0.0–0.2)

## 2020-11-19 LAB — COMPREHENSIVE METABOLIC PANEL
ALT: 10 U/L (ref 0–44)
AST: 17 U/L (ref 15–41)
Albumin: 3.4 g/dL — ABNORMAL LOW (ref 3.5–5.0)
Alkaline Phosphatase: 52 U/L (ref 38–126)
Anion gap: 10 (ref 5–15)
BUN: 4 mg/dL — ABNORMAL LOW (ref 6–20)
CO2: 32 mmol/L (ref 22–32)
Calcium: 9.3 mg/dL (ref 8.9–10.3)
Chloride: 100 mmol/L (ref 98–111)
Creatinine, Ser: 0.75 mg/dL (ref 0.44–1.00)
GFR, Estimated: >60 mL/min (ref >60)
Glucose, Bld: 110 mg/dL — ABNORMAL HIGH (ref 70–99)
Potassium: 3.2 mmol/L — ABNORMAL LOW (ref 3.5–5.1)
Sodium: 142 mmol/L (ref 135–145)
Total Bilirubin: 0.4 mg/dL (ref 0.3–1.2)
Total Protein: 7.6 g/dL (ref 6.5–8.1)

## 2020-11-19 MED ORDER — PROCHLORPERAZINE MALEATE 10 MG PO TABS
10.0000 mg | ORAL_TABLET | Freq: Once | ORAL | Status: AC
  Administered 2020-11-19: 13:00:00 10 mg via ORAL

## 2020-11-19 MED ORDER — SODIUM CHLORIDE 0.9% FLUSH
10.0000 mL | INTRAVENOUS | Status: DC | PRN
  Administered 2020-11-19: 15:00:00 10 mL
  Filled 2020-11-19: qty 10

## 2020-11-19 MED ORDER — HEPARIN SOD (PORK) LOCK FLUSH 100 UNIT/ML IV SOLN
500.0000 [IU] | Freq: Once | INTRAVENOUS | Status: AC | PRN
  Administered 2020-11-19: 15:00:00 500 [IU]
  Filled 2020-11-19: qty 5

## 2020-11-19 MED ORDER — SODIUM CHLORIDE 0.9 % IV SOLN
1.4000 mg/m2 | Freq: Once | INTRAVENOUS | Status: AC
  Administered 2020-11-19: 14:00:00 2.75 mg via INTRAVENOUS
  Filled 2020-11-19: qty 5.5

## 2020-11-19 MED ORDER — SODIUM CHLORIDE 0.9% FLUSH
10.0000 mL | INTRAVENOUS | Status: DC | PRN
  Filled 2020-11-19: qty 10

## 2020-11-19 MED ORDER — HEPARIN SOD (PORK) LOCK FLUSH 100 UNIT/ML IV SOLN
500.0000 [IU] | Freq: Once | INTRAVENOUS | Status: DC | PRN
  Filled 2020-11-19: qty 5

## 2020-11-19 MED ORDER — PROCHLORPERAZINE MALEATE 10 MG PO TABS
ORAL_TABLET | ORAL | Status: AC
  Filled 2020-11-19: qty 1

## 2020-11-19 MED ORDER — SODIUM CHLORIDE 0.9 % IV SOLN
Freq: Once | INTRAVENOUS | Status: AC
  Filled 2020-11-19: qty 250

## 2020-11-19 NOTE — Patient Instructions (Signed)
[facility] Discharge Instructions for Patients Receiving Chemotherapy  Today you received the following chemotherapy agents haloven.  To help prevent nausea and vomiting after your treatment, we encourage you to take your nausea medication as directed.   If you develop nausea and vomiting that is not controlled by your nausea medication, call the clinic.   BELOW ARE SYMPTOMS THAT SHOULD BE REPORTED IMMEDIATELY:  *FEVER GREATER THAN 100.5 F  *CHILLS WITH OR WITHOUT FEVER  NAUSEA AND VOMITING THAT IS NOT CONTROLLED WITH YOUR NAUSEA MEDICATION  *UNUSUAL SHORTNESS OF BREATH  *UNUSUAL BRUISING OR BLEEDING  TENDERNESS IN MOUTH AND THROAT WITH OR WITHOUT PRESENCE OF ULCERS  *URINARY PROBLEMS  *BOWEL PROBLEMS  UNUSUAL RASH Items with * indicate a potential emergency and should be followed up as soon as possible.  Feel free to call the clinic should you have any questions or concerns. The clinic phone number is [phone].  Please show the [facility] at check-in to the Emergency Department and triage nurse.

## 2020-11-19 NOTE — Patient Instructions (Signed)

## 2020-11-19 NOTE — Progress Notes (Signed)
Patient requested that labs be drawn peripherally made lab aware and instructed them to check patient into MD next.

## 2020-11-19 NOTE — Progress Notes (Signed)
Per Dr. [name], [place] to treat with [place] 1.4.

## 2020-11-23 ENCOUNTER — Telehealth: Payer: Self-pay | Admitting: Oncology

## 2020-11-23 NOTE — Telephone Encounter (Signed)
Scheduled appts per 12/20 staff msg. Pt confirmed appt dates and times.

## 2020-11-26 ENCOUNTER — Inpatient Hospital Stay: Payer: Medicaid Other | Admitting: Oncology

## 2020-12-11 ENCOUNTER — Inpatient Hospital Stay: Payer: Medicaid Other

## 2020-12-11 ENCOUNTER — Encounter: Payer: Self-pay | Admitting: Adult Health

## 2020-12-11 ENCOUNTER — Inpatient Hospital Stay (HOSPITAL_BASED_OUTPATIENT_CLINIC_OR_DEPARTMENT_OTHER): Payer: Medicaid Other | Admitting: Adult Health

## 2020-12-11 ENCOUNTER — Inpatient Hospital Stay: Payer: Medicaid Other | Attending: Oncology

## 2020-12-11 ENCOUNTER — Other Ambulatory Visit: Payer: Self-pay

## 2020-12-11 VITALS — BP 114/76 | HR 115 | Temp 97.6°F | Resp 18 | Ht 60.0 in | Wt 177.0 lb

## 2020-12-11 VITALS — HR 97

## 2020-12-11 DIAGNOSIS — Z171 Estrogen receptor negative status [ER-]: Secondary | ICD-10-CM

## 2020-12-11 DIAGNOSIS — Z5112 Encounter for antineoplastic immunotherapy: Secondary | ICD-10-CM | POA: Diagnosis present

## 2020-12-11 DIAGNOSIS — Z923 Personal history of irradiation: Secondary | ICD-10-CM | POA: Insufficient documentation

## 2020-12-11 DIAGNOSIS — C50411 Malignant neoplasm of upper-outer quadrant of right female breast: Secondary | ICD-10-CM | POA: Insufficient documentation

## 2020-12-11 DIAGNOSIS — F418 Other specified anxiety disorders: Secondary | ICD-10-CM | POA: Diagnosis not present

## 2020-12-11 DIAGNOSIS — I10 Essential (primary) hypertension: Secondary | ICD-10-CM | POA: Diagnosis not present

## 2020-12-11 DIAGNOSIS — E669 Obesity, unspecified: Secondary | ICD-10-CM | POA: Diagnosis not present

## 2020-12-11 DIAGNOSIS — Z801 Family history of malignant neoplasm of trachea, bronchus and lung: Secondary | ICD-10-CM | POA: Diagnosis not present

## 2020-12-11 DIAGNOSIS — Z87891 Personal history of nicotine dependence: Secondary | ICD-10-CM | POA: Insufficient documentation

## 2020-12-11 DIAGNOSIS — C7951 Secondary malignant neoplasm of bone: Secondary | ICD-10-CM

## 2020-12-11 DIAGNOSIS — C50919 Malignant neoplasm of unspecified site of unspecified female breast: Secondary | ICD-10-CM | POA: Diagnosis not present

## 2020-12-11 DIAGNOSIS — Z9011 Acquired absence of right breast and nipple: Secondary | ICD-10-CM | POA: Diagnosis not present

## 2020-12-11 DIAGNOSIS — Z170 Estrogen receptor positive status [ER+]: Secondary | ICD-10-CM | POA: Diagnosis not present

## 2020-12-11 DIAGNOSIS — Z803 Family history of malignant neoplasm of breast: Secondary | ICD-10-CM | POA: Insufficient documentation

## 2020-12-11 DIAGNOSIS — Z809 Family history of malignant neoplasm, unspecified: Secondary | ICD-10-CM | POA: Diagnosis not present

## 2020-12-11 DIAGNOSIS — T451X5A Adverse effect of antineoplastic and immunosuppressive drugs, initial encounter: Secondary | ICD-10-CM

## 2020-12-11 DIAGNOSIS — Z7189 Other specified counseling: Secondary | ICD-10-CM

## 2020-12-11 DIAGNOSIS — D573 Sickle-cell trait: Secondary | ICD-10-CM | POA: Diagnosis not present

## 2020-12-11 DIAGNOSIS — G620 Drug-induced polyneuropathy: Secondary | ICD-10-CM

## 2020-12-11 DIAGNOSIS — K219 Gastro-esophageal reflux disease without esophagitis: Secondary | ICD-10-CM | POA: Diagnosis not present

## 2020-12-11 LAB — COMPREHENSIVE METABOLIC PANEL
ALT: 7 U/L (ref 0–44)
AST: 15 U/L (ref 15–41)
Albumin: 3.6 g/dL (ref 3.5–5.0)
Alkaline Phosphatase: 66 U/L (ref 38–126)
Anion gap: 13 (ref 5–15)
BUN: 7 mg/dL (ref 6–20)
CO2: 24 mmol/L (ref 22–32)
Calcium: 10.1 mg/dL (ref 8.9–10.3)
Chloride: 102 mmol/L (ref 98–111)
Creatinine, Ser: 0.82 mg/dL (ref 0.44–1.00)
GFR, Estimated: >60 mL/min (ref >60)
Glucose, Bld: 92 mg/dL (ref 70–99)
Potassium: 3.7 mmol/L (ref 3.5–5.1)
Sodium: 139 mmol/L (ref 135–145)
Total Bilirubin: 0.5 mg/dL (ref 0.3–1.2)
Total Protein: 8.7 g/dL — ABNORMAL HIGH (ref 6.5–8.1)

## 2020-12-11 LAB — RETICULOCYTES
Immature Retic Fract: 17.9 % — ABNORMAL HIGH (ref 2.3–15.9)
RBC.: 3.17 MIL/uL — ABNORMAL LOW (ref 3.87–5.11)
Retic Count, Absolute: 34.9 10*3/uL (ref 19.0–186.0)
Retic Ct Pct: 1.1 % (ref 0.4–3.1)

## 2020-12-11 LAB — CBC WITH DIFFERENTIAL/PLATELET
Abs Immature Granulocytes: 0.06 10*3/uL (ref 0.00–0.07)
Basophils Absolute: 0.1 10*3/uL (ref 0.0–0.1)
Basophils Relative: 0 %
Eosinophils Absolute: 0 10*3/uL (ref 0.0–0.5)
Eosinophils Relative: 0 %
HCT: 25.5 % — ABNORMAL LOW (ref 36.0–46.0)
Hemoglobin: 8.5 g/dL — ABNORMAL LOW (ref 12.0–15.0)
Immature Granulocytes: 0 %
Lymphocytes Relative: 11 %
Lymphs Abs: 1.5 10*3/uL (ref 0.7–4.0)
MCH: 27.3 pg (ref 26.0–34.0)
MCHC: 33.3 g/dL (ref 30.0–36.0)
MCV: 82 fL (ref 80.0–100.0)
Monocytes Absolute: 1.2 10*3/uL — ABNORMAL HIGH (ref 0.1–1.0)
Monocytes Relative: 9 %
Neutro Abs: 10.7 10*3/uL — ABNORMAL HIGH (ref 1.7–7.7)
Neutrophils Relative %: 80 %
Platelets: 337 10*3/uL (ref 150–400)
RBC: 3.11 MIL/uL — ABNORMAL LOW (ref 3.87–5.11)
RDW: 19.8 % — ABNORMAL HIGH (ref 11.5–15.5)
WBC: 13.6 10*3/uL — ABNORMAL HIGH (ref 4.0–10.5)
nRBC: 0 % (ref 0.0–0.2)

## 2020-12-11 MED ORDER — SODIUM CHLORIDE 0.9 % IV SOLN
Freq: Once | INTRAVENOUS | Status: AC
  Filled 2020-12-11: qty 250

## 2020-12-11 MED ORDER — PROCHLORPERAZINE MALEATE 10 MG PO TABS
ORAL_TABLET | ORAL | Status: AC
  Filled 2020-12-11: qty 1

## 2020-12-11 MED ORDER — SODIUM CHLORIDE 0.9% FLUSH
10.0000 mL | INTRAVENOUS | Status: DC | PRN
  Administered 2020-12-11: 10 mL
  Filled 2020-12-11: qty 10

## 2020-12-11 MED ORDER — HEPARIN SOD (PORK) LOCK FLUSH 100 UNIT/ML IV SOLN
500.0000 [IU] | Freq: Once | INTRAVENOUS | Status: AC | PRN
  Administered 2020-12-11: 500 [IU]
  Filled 2020-12-11: qty 5

## 2020-12-11 MED ORDER — PROCHLORPERAZINE MALEATE 10 MG PO TABS
10.0000 mg | ORAL_TABLET | Freq: Once | ORAL | Status: AC
  Administered 2020-12-11: 10 mg via ORAL

## 2020-12-11 MED ORDER — SODIUM CHLORIDE 0.9 % IV SOLN
1.4000 mg/m2 | Freq: Once | INTRAVENOUS | Status: AC
  Administered 2020-12-11: 2.75 mg via INTRAVENOUS
  Filled 2020-12-11: qty 5.5

## 2020-12-11 MED ORDER — METHADONE HCL 5 MG PO TABS: 5.0000 mg | ORAL_TABLET | Freq: Three times a day (TID) | ORAL | 0 refills | Status: DC

## 2020-12-11 NOTE — Patient Instructions (Signed)
[facility] Discharge Instructions for Patients Receiving Chemotherapy  Today you received the following chemotherapy agents: eribulin.  To help prevent nausea and vomiting after your treatment, we encourage you to take your nausea medication as directed.   If you develop nausea and vomiting that is not controlled by your nausea medication, call the clinic.   BELOW ARE SYMPTOMS THAT SHOULD BE REPORTED IMMEDIATELY:  *FEVER GREATER THAN 100.5 F  *CHILLS WITH OR WITHOUT FEVER  NAUSEA AND VOMITING THAT IS NOT CONTROLLED WITH YOUR NAUSEA MEDICATION  *UNUSUAL SHORTNESS OF BREATH  *UNUSUAL BRUISING OR BLEEDING  TENDERNESS IN MOUTH AND THROAT WITH OR WITHOUT PRESENCE OF ULCERS  *URINARY PROBLEMS  *BOWEL PROBLEMS  UNUSUAL RASH Items with * indicate a potential emergency and should be followed up as soon as possible.  Feel free to call the clinic should you have any questions or concerns. The clinic phone number is [phone].  Please show the CHEMO ALERT CARD at check-in to the Emergency Department and triage nurse.   

## 2020-12-11 NOTE — Progress Notes (Signed)
[facility]  [phone] [phone]) [phone]   ID: [name] DOB: Nov 15, 1976  MR#: [id number]  DDU#:[id number]  Patient Care Team: Dorena Dew, FNP as PCP - General (Family Medicine) Alphonsa Overall, MD as Consulting Physician (General Surgery) Magrinat, [name], MD as Consulting Physician (Oncology) Gery Pray, MD as Consulting Physician (Radiation Oncology) Delice Bison, Charlestine Massed, NP as Nurse Practitioner (Hematology and Oncology) Alda Berthold, DO as Consulting Physician (Neurology) OTHER MD:  CHIEF COMPLAINT: triple negative stage IV breast cancer  CURRENT TREATMENT: eribulin day 1 and day 8 every 21 day cycle   INTERVAL HISTORY: Rebecca [name] returns today for follow up and treatment of her triple negative metastatic breast cancer accompanied by her mother Rebecca [name].  She was switched to eribulin at her last visit on 11/12/2020.  Today is cycle 2 day 1 of treatment.  She has no peripheral neuroapthy.  We have also been following her CA 27-29: Lab Results  Component Value Date   CA2729 297.2 (H) 11/11/2020   CA2729 261.8 (H) 10/15/2020   CA2729 246.1 (H) 09/24/2020   CA2729 238.4 (H) 08/27/2020   CA2729 290.5 (H) 07/31/2020    REVIEW OF SYSTEMS: Rebecca [name] is taking methadone 1-2 times a day and is requesting refills on this and the oxy.  She was unaware about taking the aleve and tylenol in between her methadone doses, and has not been doing this.  She also notes her right wrist has become sore for the past 1.5 weeks.  She says it is medial and is tender to touch.  She cannot recall anything that she may have done to bring this on.  She denies any constipation, diarrhea, cough, shortness of breath, fever, chills, or further concerns.  A detailed ROS was otherwise non contributory.       COVID 19 VACCINATION STATUS: She received Moderna x2 most recently February 2021    BREAST CANCER HISTORY: From the original intake note:  Rebecca [name] herself  noted a change in her right breast sometime around September or October 2016. She did not bring it to intermediate medical attention, but on 01/13/2016 she established herself in Dr. Smith Robert' service and she was set up for bilateral diagnostic mammography with tomosynthesis and bilateral ultrasonography at the [facility] 01/19/2016. The breast density was category B. In the upper outer quadrant of the right breast there was a spiculated mass measuring 2.8 cm. On physical exam this was palpable. Targeted ultrasonography confirmed an irregular hypoechoic mass in the right breast 11:30 o'clock position measuring 2.6 cm maximally. Ultrasound of the right axilla showed a morphologically abnormal lymph node.  In the left breast there were some tubular densities behind the areola which by ultrasonography showed benign ductal ectasia.  On 01/28/2016 [name] underwent biopsy of the right breast mass and abnormal right axillary lymph node. The pathology from this procedure (S [id number]) showed the lymph node to be benign. In the breast however there was an invasive ductal carcinoma, grade 3, which was estrogen and progesterone receptor negative. The proliferation marker was 70%. HER-2 was not amplified with a signals ratio of 1.32. The number per cell was 2.05.  The patient's subsequent history is as detailed below   PAST MEDICAL HISTORY: Past Medical History:  Diagnosis Date  . Anxiety   . Breast cancer ([place])   . Depression   . GERD (gastroesophageal reflux disease)   . History of radiation therapy 11/15/16-01/12/17   right chest wall and axilla treated to 45 Gy in  25 fractions, boosted and additional 14 Gy in 8 fractions  . Hypertension    diet controlled  . Obesity (BMI 35.0-39.9 without comorbidity)   . Pneumonia    as a child  . Seasonal allergies   . Sickle cell trait ([place])   . Termination of pregnancy (fetus) 04/02/16    PAST SURGICAL HISTORY: Past Surgical History:  Procedure Laterality  Date  . CESAREAN SECTION     2004 and 2007  . MASTECTOMY W/ SENTINEL NODE BIOPSY Right 09/06/2016   Procedure: RIGHT BREAST MASTECTOMY WITH RIGHT AXILLARY SENTINEL LYMPH NODE BIOPSY;  Surgeon: Alphonsa Overall, MD;  Location: [facility];  Service: General;  Laterality: Right;  . PORT-A-CATH REMOVAL Left 09/06/2016   Procedure: REMOVAL PORT-A-CATH;  Surgeon: Alphonsa Overall, MD;  Location: [facility];  Service: General;  Laterality: Left;  . PORTACATH PLACEMENT    . PORTACATH PLACEMENT N/A 07/09/2019   Procedure: INSERTION PORT-A-CATH WITH ULTRASOUND;  Surgeon: Alphonsa Overall, MD;  Location: [facility] OR;  Service: General;  Laterality: N/A;    FAMILY HISTORY Family History  Problem Relation Age of Onset  . Hypertension Mother   . Cancer Mother        dx "intestinal cancer" in her 46s; +surgery  . Other Mother        hysterectomy at young age for unspecified cause  . Heart Problems Mother   . Breast cancer Cousin        maternal 1st cousin dx female breast cancer at 36-46y  . Cancer Father   . Hypertension Father   . Heart Problems Maternal Aunt   . Diabetes Maternal Aunt   . Breast cancer Maternal Uncle        dx 64-65  . Heart Problems Maternal Uncle   . Breast cancer Maternal Grandmother 4  . Throat cancer Maternal Grandfather        d. 13s; smoker  . Sickle cell anemia Paternal Aunt   . Congestive Heart Failure Maternal Aunt   . Multiple sclerosis Cousin   . Cancer Other        maternal great uncle (MGM's brother); cancer removed from his side  . Heart attack Paternal Aunt        d. early 22s  The patient has very little information about her father. Her mother is currently 46 years old. She had a history of cervical cancer at age 38. The patient had 2 brothers, no sisters. The maternal grandfather had throat cancer. A maternal uncle was diagnosed with breast cancer as well as prostate cancer at the age of 55. 2 maternal cousins, one of them female, had breast  cancer as well.   GYNECOLOGIC HISTORY:  No LMP recorded. Menarche age 1, first live birth age 33. The patient is GX P4. She was still having regular periods at the time of diagnosis. She took oral contraceptives in the 1990s with no side effects.--.  Stopped with chemotherapy and have not resumed as of May 2019   SOCIAL HISTORY:   She works on and off as a Market researcher. The patient's significant other [name] works at [employer].  At home with the patient are her 3 children [name], [place] and [place]. There are age 78, 60, and 29 as of November 2019.  2 of them are disabled or have significant health problems, one with sickle cell disease, the other with autism and developmental delay.  The patient's son [name], currently 55  years old, lives in [place].    ADVANCED DIRECTIVES: Not in place; she intends to name her mother as her 50, appropriate documents given 11/11/2020   HEALTH MAINTENANCE: Social History   Tobacco Use  . Smoking status: Former Smoker    Packs/day: 1.00    Years: 20.00    Pack years: 20.00    Types: Cigarettes    Quit date: 04/01/2018    Years since quitting: 2.6  . Smokeless tobacco: Never Used  . Tobacco comment: Patient has quit smoking x 1 year now  Vaping Use  . Vaping Use: Never used  Substance Use Topics  . Alcohol use: Yes    Comment: occ  . Drug use: No    Colonoscopy:  PAP:  Bone density:  Lipid panel:  No Known Allergies  Current Outpatient Medications on File Prior to Visit  Medication Sig Dispense Refill  . LORazepam (ATIVAN) 0.5 MG tablet Take 1 tablet (0.5 mg total) by mouth daily as needed for anxiety. 30 tablet 0  . methadone (DOLOPHINE) 5 MG tablet Take 1 tablet (5 mg total) by mouth every 8 (eight) hours. 30 tablet 0  . polyethylene glycol (MIRALAX / GLYCOLAX) 17 g packet Take 17 g by mouth daily. 14 each 0   No current facility-administered medications on file prior to visit.     OBJECTIVE: African-American woman who appears stated age  45:   12/11/20 1325  BP: 114/76  Pulse: (!) 115  Resp: 18  Temp: 97.6 F (36.4 C)  SpO2: 99%   Wt Readings from Last 3 Encounters:  12/11/20 177 lb (80.3 kg)  11/19/20 181 lb 14.4 oz (82.5 kg)  11/12/20 181 lb 12 oz (82.4 kg)   Body mass index is 34.57 kg/m.    ECOG FS: 1  GENERAL: Patient is a well appearing female in no acute distress HEENT:  Sclerae anicteric.  Mask in place Neck is supple.  NODES:  No cervical, supraclavicular, or axillary lymphadenopathy palpated.  BREAST EXAM:  Deferred. LUNGS:  Clear to auscultation bilaterally.  No wheezes or rhonchi. HEART:  Regular rate and rhythm. No murmur appreciated. ABDOMEN:  Soft, nontender.  Positive, normoactive bowel sounds. No organomegaly palpated. MSK:  No focal spinal tenderness to palpation. Full range of motion bilaterally in the upper extremities. EXTREMITIES:  No peripheral edema.   SKIN:  Clear with no obvious rashes or skin changes. No nail dyscrasia. NEURO:  Nonfocal. Well oriented.  Appropriate affect.    Right breast chest wall area 05/06/2020     Cutaneous mass in left breast 11/11/2020   Right chest wall cavity 11/11/2020    LAB RESULTS: CMP Latest Ref Rng & Units 11/19/2020 11/11/2020 10/22/2020  Glucose 70 - 99 mg/dL 110(H) 85 93  BUN 6 - 20 mg/dL 4(L) 6 8  Creatinine 0.44 - 1.00 mg/dL 0.75 0.87 0.74  Sodium 135 - 145 mmol/L 142 141 144  Potassium 3.5 - 5.1 mmol/L 3.2(L) 3.5 3.8  Chloride 98 - 111 mmol/L 100 103 105  CO2 22 - 32 mmol/L 32 27 30  Calcium 8.9 - 10.3 mg/dL 9.3 9.8 9.3  Total Protein 6.5 - 8.1 g/dL 7.6 8.0 7.3  Total Bilirubin 0.3 - 1.2 mg/dL 0.4 0.2(L) <0.2(L)  Alkaline Phos 38 - 126 U/L 52 67 65  AST 15 - 41 U/L 17 14(L) 12(L)  ALT 0 - 44 U/L $Remo'10 6 9    'JrKOK$ CBC    Component Value Date/Time   WBC 3.1 (L) 11/19/2020 1129  RBC 3.03 (L) 11/19/2020 1129   HGB 8.3 (L) 11/19/2020 1129   HGB 9.4 (L) 08/07/2020  1129   HGB 10.4 (L) 10/04/2016 1154   HCT 26.1 (L) 11/19/2020 1129   HCT 31.3 (L) 10/04/2016 1154   PLT 226 11/19/2020 1129   PLT 309 08/07/2020 1129   PLT 320 10/04/2016 1154   MCV 86.1 11/19/2020 1129   MCV 95.7 10/04/2016 1154   MCH 27.4 11/19/2020 1129   MCHC 31.8 11/19/2020 1129   RDW 18.9 (H) 11/19/2020 1129   RDW 16.3 (H) 10/04/2016 1154   LYMPHSABS 1.1 11/19/2020 1129   LYMPHSABS 1.7 10/04/2016 1154   MONOABS 0.4 11/19/2020 1129   MONOABS 0.5 10/04/2016 1154   EOSABS 0.0 11/19/2020 1129   EOSABS 0.3 10/04/2016 1154   BASOSABS 0.1 11/19/2020 1129   BASOSABS 0.0 10/04/2016 1154    STUDIES: No results found.   RESEARCH: Referred to PREVENT study, but was pregnant at the time; referred to [facility]02, but the biopsied lymph node was benign; referred to weight loss study but declined; referred to [facility] 12/09/2000, but the timing of radiation and 8 was greater than 60 days past the date of diagnosis; referred to health disparity study, but declined; referred to [id number] adjuvant therapy study, but the patient received Xeloda with radiation and therefore was ineligible. Referred to [facility] October 2021 for consideration of studies but did not fit any protocols there at that time   ASSESSMENT: 45 y.o. [place] woman status post right breast upper-outer quadrant biopsy 01/28/2016 for a clinical T2 N0 invasive ductal carcinoma, grade 3, triple negative, with an MIB-1 of 70%.  (a) suspicious right axillary lymph node biopsied 01/28/2016 was benign  (1) neoadjuvant chemotherapy: doxorubicin and cyclophosphamide in dose dense fashion 4 started 04/14/16, completed 05/26/2016, followed by paclitaxel and carboplatin weekly 12, Started 06/09/2016  (a) taxol discontinued after 7 doses because of neuropathy, last dose 07/21/2016  (2) genetics testing October 20, 2016 through the 32-gene Comprehensive Cancer Panel offered by [facility] [place], MD)  (with MSH2 Exons 1-7 Inversion Analysis) found no deleterious mutations or VUSS  In APC, ATM, AXIN2, BARD1, BMPR1A, BRCA1, BRCA2, BRIP1, CDH1, CDK4, CDKN2A, CHEK2, EPCAM, FANCC, MLH1, MSH2, MSH6, MUTYH, NBN, PALB2, PMS2, POLD1, POLE, PTEN, RAD51C, RAD51D, SCG5/GREM1, SMAD4, STK11, TP53, VHL, and XRCC2.    (3) right mastectomy and sentinel lymph node sampling 09/06/2016 showed a residual ypT1c ypN0, invasive ductal carcinoma, grade 3, with negative margins. Repeat prognostic panel again triple negative   (4) adjuvant radiation with capecitabine/Xeloda sensitization 11/15/16 - 01/12/17 Site/dose:   Right Chest Wall and axilla (4 field) treated to 45 Gy in 25 fractions, and then Boosted an additional 14.4 Gy in 8 fractions.  (5) tobacco abuse disorder: The patient quit smoking 04/04/2018  METASTATIC DISEASE:  July 2020: chest wall, bones, nodes (6) nonspecific changes noted on chest CT 06/11/2019 were clarified by PET scan 06/19/2019 showing hypermetabolic disease in the right anterior chest wall, right internal mammary nodes, right and left axillary nodes, but no metastatic disease in the neck, lungs, abdomen or pelvis.  Bone marrow uptake suggests bony metastatic disease.  (a) CARIS requested obtained from 06/28/2019 sample confirmed a triple negativity, the tumor also was negative for the androgen receptor, was MSI stable and mismatch repair status proficient, with a low mutational burden.  BRCA 1 and 2 were negative and PD-L1 was negative.  PIK3 showed a variant of uncertain significance.  However the tumor was genomic LOH high  (  b) CA-27-29 is informative: was 118.3 on 06/04/2019  (7) zoledronate started 07/17/2019--on hold currently due to dental issues, and until 6 weeks at least after dental work  (8) carboplatin/ gemcitabine days 1 and 8 Q21 day cycle started 07/10/2019, last dose 10/30/2019  (a) restaging studies 09/2019 showed no progression  (b) restaging studies after 6 cycles showed mild  disease progression  (9) cyclophosphamide, methotrexate, fluorouracil chemotherapy started 11/13/2019, repeated every 21 days.  (a) discontinued after cycle 3 (12/31/2019): No evidence of response  (10) started capecitabine 01/21/2020, given 1 week on and 1 week off at standard doses (1500 mg twice daily)   (a) Discontinued after almost three months of treatment (last on 04/06/2020)  (b) Disease progression on 4/27 CT scan showing increasing right chest wall disease, left breast lesions, and ? Liver involvement.  (c) MRI liver 04/14/2020 shows no liver invovlement  (11) Started liposomal doxorubicin/Doxil given on day 1 of a 21 day cycle on 04/07/2020  (a) echo on 04/02/2020 shows EF of 50-55%, repeat in 06/2020  (b) chest CT scan after 4 cycles of Doxil shows some evidence of progression in left axillary and right internal mammary adenopathy  (c) Doxil discontinued after 06/09/2020 dose  (12) started sacituzumab govitecan/ Trodelvy 07/10/2020  (a) day 8 cycle 2 omitted due to febrile neutropenia requiring admission  (b) changed to every two week dosing with Udenyca support after cycle 2  (c) discontinued after 09/24/2020 dose with evidence of disease progression  (13) started navelbine 10/15/2020, to be repeated days 1 and 8 of each 21-day cycle  (a) chest CT scan 10/06/2020 is new baseline study for measurable disease  (b) discontinued after one cycle at patient's request (not tolerated)  (14) started eribulin 11/12/2020, repeated days 1 and 8 of each 21-day cycle  (15) pain management:  (a) methadone started 11/12/2020, 5 mg p.o. every 8 hours  (b) may take Tylenol Aleve or other NSAIDs for breakthrough pain  (c) bowel prophylaxis: Currently using magnesium citrate irregularly   PLAN: Nastassja is tolerating the Eribulin well.  She has not had labs drawn yet, but will do so back in infusion.  She will proceed with treatment so long as her labs are within parameters.    Evalisse and I  reviewed her pain regimen in detail.  I refilled her methadone and she is aware to take it TID, not as needed to keep her pain stable.  She understands she should take Tylenol/Aleve for breakthrough pain.  Should this pain regimen be ineffective taken as prescribed, then we will consider changes.   We will see Grayce back in one week for labs and infusion and in 3 weeks she will return for labs, f/u with Dr. [name], and cycle 3 of treatment.      After she has 4 cycles of the current treatment she will be completely restaged.  She knows to call for any questions that may arise between now and her next appointment.  We are happy to see her sooner if needed.   Total encounter time 30 minutes.Wilber Bihari, NP 12/11/20 1:28 PM Medical Oncology and Hematology [facility] [place], [place] [address] Tel. [phone]    Fax. [phone]   *Total Encounter Time as defined by the Centers for Medicare and Medicaid Services includes, in addition to the face-to-face time of a patient visit (documented in the note above) non-face-to-face time: obtaining and reviewing outside history, ordering and reviewing medications, tests or procedures, care  coordination (communications with other health care professionals or caregivers) and documentation in the medical record.

## 2020-12-12 LAB — CANCER ANTIGEN 27.29: CA 27.29: 237.8 U/mL — ABNORMAL HIGH (ref 0.0–38.6)

## 2020-12-14 ENCOUNTER — Telehealth: Payer: Self-pay | Admitting: Adult Health

## 2020-12-14 LAB — FERRITIN: Ferritin: 1205 ng/mL — ABNORMAL HIGH (ref 11–307)

## 2020-12-14 NOTE — Telephone Encounter (Signed)
No 1/6 los. No changes made to pt's schedule.  

## 2020-12-18 ENCOUNTER — Inpatient Hospital Stay: Payer: Medicaid Other

## 2020-12-22 ENCOUNTER — Inpatient Hospital Stay: Payer: Medicaid Other

## 2020-12-22 ENCOUNTER — Telehealth: Payer: Self-pay | Admitting: *Deleted

## 2020-12-22 NOTE — Telephone Encounter (Signed)
This RN spoke with pt per her call stating she is currently at [facility] with her 47 year old son who is admitted for [employer] and Covid positive.  She has been tested for Covid and is negative.  She is requesting to be rescheduled.  Rescheduling request sent per above Marrion Coy says her son should be d/ced tomorrow ) and is hoping to get her treatment later this week.  Nan will let us know if she develops symptoms.

## 2020-12-25 ENCOUNTER — Inpatient Hospital Stay: Payer: Medicaid Other

## 2020-12-25 ENCOUNTER — Other Ambulatory Visit: Payer: Self-pay

## 2020-12-25 ENCOUNTER — Telehealth: Payer: Self-pay | Admitting: *Deleted

## 2020-12-25 NOTE — Telephone Encounter (Signed)
Per recent covid exposure ( 47 year old son with Sickle Cell ) whom she has to provide one on one care when needed and review with provider - recommendation is to hold on chemo today.  Pt will monitor her symptoms and let this office know before her next scheduled treatment on 01/01/2020.

## 2020-12-30 NOTE — Progress Notes (Incomplete)
[place]  [phone] [phone]   ID: Unknown Rebecca [name] DOB: [birth year]  MR#: [id number]  ZCK#:[id number]  Patient Care Team: Dorena Dew, FNP as PCP - General (Family Medicine) Alphonsa Overall, MD as Consulting Physician (General Surgery) Magrinat, [name], MD as Consulting Physician (Oncology) Gery Pray, MD as Consulting Physician (Radiation Oncology) Delice Bison, Charlestine Massed, NP as Nurse Practitioner (Hematology and Oncology) Alda Berthold, DO as Consulting Physician (Neurology) OTHER MD:  CHIEF COMPLAINT: triple negative stage IV breast cancer  CURRENT TREATMENT: eribulin day 1 and day 8 every 21 day cycle   INTERVAL HISTORY: Rebecca [name] returns today for follow up and treatment of her triple negative metastatic breast cancer accompanied by her mother Rebecca [name].  She was switched to eribulin at her last visit on 11/12/2020.  Today is cycle 3 day 1 of treatment.  She has no peripheral neuroapthy.  We have also been following her CA 27-29: Lab Results  Component Value Date   CA2729 237.8 (H) 12/11/2020   CA2729 297.2 (H) 11/11/2020   CA2729 261.8 (H) 10/15/2020   CA2729 246.1 (H) 09/24/2020   CA2729 238.4 (H) 08/27/2020    REVIEW OF SYSTEMS: Ashla    COVID 19 VACCINATION STATUS: She received Moderna x2 most recently February 2021    BREAST CANCER HISTORY: From the original intake note:  Rebecca [name] herself noted a change in her right breast sometime around September or October 2016. She did not bring it to intermediate medical attention, but on 01/13/2016 she established herself in Dr. Smith Robert' service and she was set up for bilateral diagnostic mammography with tomosynthesis and bilateral ultrasonography at the [facility] 01/19/2016. The breast density was category B. In the upper outer quadrant of the right breast there was a spiculated mass measuring 2.8 cm. On physical exam this was palpable. Targeted ultrasonography confirmed an  irregular hypoechoic mass in the right breast 11:30 o'clock position measuring 2.6 cm maximally. Ultrasound of the right axilla showed a morphologically abnormal lymph node.  In the left breast there were some tubular densities behind the areola which by ultrasonography showed benign ductal ectasia.  On 01/28/2016 [name] underwent biopsy of the right breast mass and abnormal right axillary lymph node. The pathology from this procedure (S [id number]) showed the lymph node to be benign. In the breast however there was an invasive ductal carcinoma, grade 3, which was estrogen and progesterone receptor negative. The proliferation marker was 70%. HER-2 was not amplified with a signals ratio of 1.32. The number per cell was 2.05.  The patient's subsequent history is as detailed below   PAST MEDICAL HISTORY: Past Medical History:  Diagnosis Date  . Anxiety   . Breast cancer ([place])   . Depression   . GERD (gastroesophageal reflux disease)   . History of radiation therapy 11/15/16-01/12/17   right chest wall and axilla treated to 45 Gy in 25 fractions, boosted and additional 14 Gy in 8 fractions  . Hypertension    diet controlled  . Obesity (BMI 35.0-39.9 without comorbidity)   . Pneumonia    as a child  . Seasonal allergies   . Sickle cell trait ([place])   . Termination of pregnancy (fetus) 04/02/16    PAST SURGICAL HISTORY: Past Surgical History:  Procedure Laterality Date  . CESAREAN SECTION     2004 and 2007  . MASTECTOMY W/ SENTINEL NODE BIOPSY Right 09/06/2016   Procedure: RIGHT BREAST MASTECTOMY WITH RIGHT AXILLARY SENTINEL LYMPH NODE BIOPSY;  Surgeon: [name], MD;  Location: [facility];  Service: General;  Laterality: Right;  . PORT-A-CATH REMOVAL Left 09/06/2016   Procedure: REMOVAL PORT-A-CATH;  Surgeon: [name], MD;  Location: [facility];  Service: General;  Laterality: Left;  . PORTACATH PLACEMENT    . PORTACATH PLACEMENT N/A 07/09/2019    Procedure: INSERTION PORT-A-CATH WITH ULTRASOUND;  Surgeon: [name], MD;  Location[facility];  Service: General;  Laterality: N/A;    FAMILY HISTORY Family History  Problem Relation Age of Onset  . Hypertension Mother   . Cancer Mother        dx "intestinal cancer" in her 82s; +surgery  . Other Mother        hysterectomy at young age for unspecified cause  . Heart Problems Mother   . Breast cancer Cousin        maternal 1st cousin dx female breast cancer at 6-46y  . Cancer Father   . Hypertension Father   . Heart Problems Maternal Aunt   . Diabetes Maternal Aunt   . Breast cancer Maternal Uncle        dx 64-65  . Heart Problems Maternal Uncle   . Breast cancer Maternal Grandmother 50  . Throat cancer Maternal Grandfather        d. 69s; smoker  . Sickle cell anemia Paternal Aunt   . Congestive Heart Failure Maternal Aunt   . Multiple sclerosis Cousin   . Cancer Other        maternal great uncle (MGM's brother); cancer removed from his side  . Heart attack Paternal Aunt        d. early 31s  The patient has very little information about her father. Her mother is currently 32 years old. She had a history of cervical cancer at age 63. The patient had 2 brothers, no sisters. The maternal grandfather had throat cancer. A maternal uncle was diagnosed with breast cancer as well as prostate cancer at the age of 63. 2 maternal cousins, one of them female, had breast cancer as well.   GYNECOLOGIC HISTORY:  No LMP recorded. Menarche age 45, first live birth age 45. The patient is GX P4. She was still having regular periods at the time of diagnosis. She took oral contraceptives in the 1990s with no side effects.--.  Stopped with chemotherapy and have not resumed as of May 2019   SOCIAL HISTORY:   She works on and off as a Garment/textile technologist. The patient's significant other Dwayne [name] works at [employer].  At home with the patient are her 3 children Rebecca [name], Rebecca [name]  and Rebecca [name]. There are age 18, 19, and 47 as of November 2019.  2 of them are disabled or have significant health problems, one with sickle cell disease, the other with autism and developmental delay.  The patient's son Girtha Rm, currently 72 years old, lives in [place].    ADVANCED DIRECTIVES: Not in place; she intends to name her mother as her HCPOA, appropriate documents given 11/11/2020   HEALTH MAINTENANCE: Social History   Tobacco Use  . Smoking status: Former Smoker    Packs/day: 1.00    Years: 20.00    Pack years: 20.00    Types: Cigarettes    Quit date: 04/01/2018    Years since quitting: 2.7  . Smokeless tobacco: Never Used  . Tobacco comment: Patient has quit smoking x 1 year now  Vaping Use  . Vaping Use: Never  used  Substance Use Topics  . Alcohol use: Yes    Comment: occ  . Drug use: No    Colonoscopy:  PAP:  Bone density:  Lipid panel:  No Known Allergies  Current Outpatient Medications on File Prior to Visit  Medication Sig Dispense Refill  . LORazepam (ATIVAN) 0.5 MG tablet Take 1 tablet (0.5 mg total) by mouth daily as needed for anxiety. 30 tablet 0  . methadone (DOLOPHINE) 5 MG tablet Take 1 tablet (5 mg total) by mouth every 8 (eight) hours. 90 tablet 0  . polyethylene glycol (MIRALAX / GLYCOLAX) 17 g packet Take 17 g by mouth daily. 14 each 0   No current facility-administered medications on file prior to visit.    OBJECTIVE: African-American woman who appears stated age  There were no vitals filed for this visit. Wt Readings from Last 3 Encounters:  12/11/20 177 lb (80.3 kg)  11/19/20 181 lb 14.4 oz (82.5 kg)  11/12/20 181 lb 12 oz (82.4 kg)   There is no height or weight on file to calculate BMI.    ECOG FS: 1  Sclerae unicteric, EOMs intact Wearing a mask No cervical or supraclavicular adenopathy Lungs no rales or rhonchi Heart regular rate and rhythm Abd soft, nontender, positive bowel sounds MSK no focal spinal  tenderness, no upper extremity lymphedema Neuro: nonfocal, well oriented, appropriate affect Breasts:    {GENERAL: Patient is a well appearing female in no acute distress HEENT:  Sclerae anicteric.  Mask in place Neck is supple.  NODES:  No cervical, supraclavicular, or axillary lymphadenopathy palpated.  BREAST EXAM:  Deferred. LUNGS:  Clear to auscultation bilaterally.  No wheezes or rhonchi. HEART:  Regular rate and rhythm. No murmur appreciated. ABDOMEN:  Soft, nontender.  Positive, normoactive bowel sounds. No organomegaly palpated. MSK:  No focal spinal tenderness to palpation. Full range of motion bilaterally in the upper extremities. EXTREMITIES:  No peripheral edema.   SKIN:  Clear with no obvious rashes or skin changes. No nail dyscrasia. NEURO:  Nonfocal. Well oriented.  Appropriate affect.}    Right breast chest wall area 05/06/2020     Cutaneous mass in left breast 11/11/2020   Right chest wall cavity 11/11/2020    LAB RESULTS: CMP Latest Ref Rng & Units 12/11/2020 11/19/2020 11/11/2020  Glucose 70 - 99 mg/dL 92 110(H) 85  BUN 6 - 20 mg/dL 7 4(L) 6  Creatinine 0.44 - 1.00 mg/dL 0.82 0.75 0.87  Sodium 135 - 145 mmol/L 139 142 141  Potassium 3.5 - 5.1 mmol/L 3.7 3.2(L) 3.5  Chloride 98 - 111 mmol/L 102 100 103  CO2 22 - 32 mmol/L 24 32 27  Calcium 8.9 - 10.3 mg/dL 10.1 9.3 9.8  Total Protein 6.5 - 8.1 g/dL 8.7(H) 7.6 8.0  Total Bilirubin 0.3 - 1.2 mg/dL 0.5 0.4 0.2(L)  Alkaline Phos 38 - 126 U/L 66 52 67  AST 15 - 41 U/L 15 17 14(L)  ALT 0 - 44 U/L _0 CBC    Component Value Date/Time   WBC 13.6 (H) 12/11/2020 1308   RBC 3.17 (L) 12/11/2020 1447   RBC 3.11 (L) 12/11/2020 1308   HGB 8.5 (L) 12/11/2020 1308   HGB 9.4 (L) 08/07/2020 1129   HGB 10.4 (L) 10/04/2016 1154   HCT 25.5 (L) 12/11/2020 1308   HCT 31.3 (L) 10/04/2016 1154   PLT 337 12/11/2020 1308   PLT 309 08/07/2020 1129   PLT 320 10/04/2016 1154  MCV 82.0 12/11/2020 1308   MCV 95.7  10/04/2016 1154   MCH 27.3 12/11/2020 1308   MCHC 33.3 12/11/2020 1308   RDW 19.8 (H) 12/11/2020 1308   RDW 16.3 (H) 10/04/2016 1154   LYMPHSABS 1.5 12/11/2020 1308   LYMPHSABS 1.7 10/04/2016 1154   MONOABS 1.2 (H) 12/11/2020 1308   MONOABS 0.5 10/04/2016 1154   EOSABS 0.0 12/11/2020 1308   EOSABS 0.3 10/04/2016 1154   BASOSABS 0.1 12/11/2020 1308   BASOSABS 0.0 10/04/2016 1154    STUDIES: No results found.   RESEARCH: Referred to PREVENT study, but was pregnant at the time; referred to [facility]02, but the biopsied lymph node was benign; referred to weight loss study but declined; referred to [facility] 12/09/2000, but the timing of radiation and 8 was greater than 60 days past the date of diagnosis; referred to health disparity study, but declined; referred to [id number] adjuvant therapy study, but the patient received Xeloda with radiation and therefore was ineligible. Referred to [facility] October 2021 for consideration of studies but did not fit any protocols there at that time   ASSESSMENT: 45 y.o. [place] woman status post right breast upper-outer quadrant biopsy 01/28/2016 for a clinical T2 N0 invasive ductal carcinoma, grade 3, triple negative, with an MIB-1 of 70%.  (a) suspicious right axillary lymph node biopsied 01/28/2016 was benign  (1) neoadjuvant chemotherapy: doxorubicin and cyclophosphamide in dose dense fashion 4 started 04/14/16, completed 05/26/2016, followed by paclitaxel and carboplatin weekly 12, Started 06/09/2016  (a) taxol discontinued after 7 doses because of neuropathy, last dose 07/21/2016  (2) genetics testing October 20, 2016 through the 32-gene Comprehensive Cancer Panel offered by [facility] Junius Roads, MD) (with MSH2 Exons 1-7 Inversion Analysis) found no deleterious mutations or VUSS  In APC, ATM, AXIN2, BARD1, BMPR1A, BRCA1, BRCA2, BRIP1, CDH1, CDK4, CDKN2A, CHEK2, EPCAM, FANCC, MLH1, MSH2, MSH6, MUTYH, NBN, PALB2,  PMS2, POLD1, POLE, PTEN, RAD51C, RAD51D, SCG5/GREM1, SMAD4, STK11, TP53, VHL, and XRCC2.    (3) right mastectomy and sentinel lymph node sampling 09/06/2016 showed a residual ypT1c ypN0, invasive ductal carcinoma, grade 3, with negative margins. Repeat prognostic panel again triple negative   (4) adjuvant radiation with capecitabine/Xeloda sensitization 11/15/16 - 01/12/17 Site/dose:   Right Chest Wall and axilla (4 field) treated to 45 Gy in 25 fractions, and then Boosted an additional 14.4 Gy in 8 fractions.  (5) tobacco abuse disorder: The patient quit smoking 04/04/2018  METASTATIC DISEASE:  July 2020: chest wall, bones, nodes (6) nonspecific changes noted on chest CT 06/11/2019 were clarified by PET scan 06/19/2019 showing hypermetabolic disease in the right anterior chest wall, right internal mammary nodes, right and left axillary nodes, but no metastatic disease in the neck, lungs, abdomen or pelvis.  Bone marrow uptake suggests bony metastatic disease.  (a) CARIS requested obtained from 06/28/2019 sample confirmed a triple negativity, the tumor also was negative for the androgen receptor, was MSI stable and mismatch repair status proficient, with a low mutational burden.  BRCA 1 and 2 were negative and PD-L1 was negative.  PIK3 showed a variant of uncertain significance.  However the tumor was genomic LOH high  (b) CA-27-29 is informative: was 118.3 on 06/04/2019  (7) zoledronate started 07/17/2019--on hold currently due to dental issues, and until 6 weeks at least after dental work  (8) carboplatin/ gemcitabine days 1 and 8 Q21 day cycle started 07/10/2019, last dose 10/30/2019  (a) restaging studies 09/2019 showed no progression  (b) restaging studies after 6  cycles showed mild disease progression  (9) cyclophosphamide, methotrexate, fluorouracil chemotherapy started 11/13/2019, repeated every 21 days.  (a) discontinued after cycle 3 (12/31/2019): No evidence of response  (10) started  capecitabine 01/21/2020, given 1 week on and 1 week off at standard doses (1500 mg twice daily)   (a) Discontinued after almost three months of treatment (last on 04/06/2020)  (b) Disease progression on 4/27 CT scan showing increasing right chest wall disease, left breast lesions, and ? Liver involvement.  (c) MRI liver 04/14/2020 shows no liver invovlement  (11) Started liposomal doxorubicin/Doxil given on day 1 of a 21 day cycle on 04/07/2020  (a) echo on 04/02/2020 shows EF of 50-55%, repeat in 06/2020  (b) chest CT scan after 4 cycles of Doxil shows some evidence of progression in left axillary and right internal mammary adenopathy  (c) Doxil discontinued after 06/09/2020 dose  (12) started sacituzumab govitecan/ Trodelvy 07/10/2020  (a) day 8 cycle 2 omitted due to febrile neutropenia requiring admission  (b) changed to every two week dosing with Udenyca support after cycle 2  (c) discontinued after 09/24/2020 dose with evidence of disease progression  (13) started navelbine 10/15/2020, to be repeated days 1 and 8 of each 21-day cycle  (a) chest CT scan 10/06/2020 is new baseline study for measurable disease  (b) discontinued after one cycle at patient's request (not tolerated)  (14) started eribulin 11/12/2020, repeated days 1 and 8 of each 21-day cycle  (15) pain management:  (a) methadone started 11/12/2020, 5 mg p.o. every 8 hours  (b) may take Tylenol Aleve or other NSAIDs for breakthrough pain  (c) bowel prophylaxis: Currently using magnesium citrate irregularly   PLAN: Shawntavia is tolerating the Eribulin well.  She has not had labs drawn yet, but will do so back in infusion.  She will proceed with treatment so long as her labs are within parameters.    Keerthana and I reviewed her pain regimen in detail.  I refilled her methadone and she is aware to take it TID, not as needed to keep her pain stable.  She understands she should take Tylenol/Aleve for breakthrough pain.  Should this pain  regimen be ineffective taken as prescribed, then we will consider changes.   We will see Mittie back in one week for labs and infusion and in 3 weeks she will return for labs, f/u with Dr. [name], and cycle 3 of treatment.     After she has 4 cycles of the current treatment she will be completely restaged.  She knows to call for any questions that may arise between now and her next appointment.  We are happy to see her sooner if needed.  Total encounter time 30 minutes.Sarajane Jews C. Magrinat, MD 12/30/20 7:58 PM Medical Oncology and Hematology [facility] [place], [place] [address] Tel. [phone]    Fax. [phone]   I, Wilburn Mylar, am acting as scribe for Dr. [name]. Magrinat.  {Add scribe attestation statement}   *Total Encounter Time as defined by the Centers for Medicare and Medicaid Services includes, in addition to the face-to-face time of a patient visit (documented in the note above) non-face-to-face time: obtaining and reviewing outside history, ordering and reviewing medications, tests or procedures, care coordination (communications with other health care professionals or caregivers) and documentation in the medical record.

## 2020-12-31 ENCOUNTER — Inpatient Hospital Stay: Payer: Medicaid Other

## 2020-12-31 ENCOUNTER — Other Ambulatory Visit: Payer: Self-pay | Admitting: *Deleted

## 2020-12-31 ENCOUNTER — Inpatient Hospital Stay (HOSPITAL_BASED_OUTPATIENT_CLINIC_OR_DEPARTMENT_OTHER): Payer: Medicaid Other | Admitting: Oncology

## 2020-12-31 ENCOUNTER — Other Ambulatory Visit: Payer: Self-pay

## 2020-12-31 ENCOUNTER — Inpatient Hospital Stay: Payer: Medicaid Other | Admitting: Oncology

## 2020-12-31 VITALS — BP 116/85 | HR 134 | Temp 97.8°F | Resp 19 | Ht 60.0 in | Wt 174.7 lb

## 2020-12-31 DIAGNOSIS — C7951 Secondary malignant neoplasm of bone: Secondary | ICD-10-CM | POA: Diagnosis not present

## 2020-12-31 DIAGNOSIS — Z171 Estrogen receptor negative status [ER-]: Secondary | ICD-10-CM

## 2020-12-31 DIAGNOSIS — T451X5A Adverse effect of antineoplastic and immunosuppressive drugs, initial encounter: Secondary | ICD-10-CM

## 2020-12-31 DIAGNOSIS — Z7189 Other specified counseling: Secondary | ICD-10-CM

## 2020-12-31 DIAGNOSIS — C50411 Malignant neoplasm of upper-outer quadrant of right female breast: Secondary | ICD-10-CM

## 2020-12-31 DIAGNOSIS — G620 Drug-induced polyneuropathy: Secondary | ICD-10-CM | POA: Diagnosis not present

## 2020-12-31 DIAGNOSIS — Z5112 Encounter for antineoplastic immunotherapy: Secondary | ICD-10-CM | POA: Diagnosis not present

## 2020-12-31 DIAGNOSIS — Z6841 Body Mass Index (BMI) 40.0 and over, adult: Secondary | ICD-10-CM | POA: Diagnosis not present

## 2020-12-31 DIAGNOSIS — C50919 Malignant neoplasm of unspecified site of unspecified female breast: Secondary | ICD-10-CM

## 2020-12-31 DIAGNOSIS — D6481 Anemia due to antineoplastic chemotherapy: Secondary | ICD-10-CM

## 2020-12-31 LAB — COMPREHENSIVE METABOLIC PANEL
ALT: <6 U/L (ref 0–44)
AST: 17 U/L (ref 15–41)
Albumin: 3.5 g/dL (ref 3.5–5.0)
Alkaline Phosphatase: 64 U/L (ref 38–126)
Anion gap: 11 (ref 5–15)
BUN: 9 mg/dL (ref 6–20)
CO2: 27 mmol/L (ref 22–32)
Calcium: 9.6 mg/dL (ref 8.9–10.3)
Chloride: 104 mmol/L (ref 98–111)
Creatinine, Ser: 0.86 mg/dL (ref 0.44–1.00)
GFR, Estimated: >60 mL/min (ref >60)
Glucose, Bld: 89 mg/dL (ref 70–99)
Potassium: 3.6 mmol/L (ref 3.5–5.1)
Sodium: 142 mmol/L (ref 135–145)
Total Bilirubin: 0.3 mg/dL (ref 0.3–1.2)
Total Protein: 8.4 g/dL — ABNORMAL HIGH (ref 6.5–8.1)

## 2020-12-31 LAB — CBC WITH DIFFERENTIAL/PLATELET
Abs Immature Granulocytes: 0.07 10*3/uL (ref 0.00–0.07)
Basophils Absolute: 0.1 10*3/uL (ref 0.0–0.1)
Basophils Relative: 0 %
Eosinophils Absolute: 0 10*3/uL (ref 0.0–0.5)
Eosinophils Relative: 0 %
HCT: 24.4 % — ABNORMAL LOW (ref 36.0–46.0)
Hemoglobin: 7.9 g/dL — ABNORMAL LOW (ref 12.0–15.0)
Immature Granulocytes: 1 %
Lymphocytes Relative: 17 %
Lymphs Abs: 2.1 10*3/uL (ref 0.7–4.0)
MCH: 26.5 pg (ref 26.0–34.0)
MCHC: 32.4 g/dL (ref 30.0–36.0)
MCV: 81.9 fL (ref 80.0–100.0)
Monocytes Absolute: 0.8 10*3/uL (ref 0.1–1.0)
Monocytes Relative: 6 %
Neutro Abs: 9.8 10*3/uL — ABNORMAL HIGH (ref 1.7–7.7)
Neutrophils Relative %: 76 %
Platelets: 292 10*3/uL (ref 150–400)
RBC: 2.98 MIL/uL — ABNORMAL LOW (ref 3.87–5.11)
RDW: 19.8 % — ABNORMAL HIGH (ref 11.5–15.5)
WBC: 12.8 10*3/uL — ABNORMAL HIGH (ref 4.0–10.5)
nRBC: 0 % (ref 0.0–0.2)

## 2020-12-31 LAB — RETICULOCYTES
Immature Retic Fract: 16.8 % — ABNORMAL HIGH (ref 2.3–15.9)
RBC.: 3.02 MIL/uL — ABNORMAL LOW (ref 3.87–5.11)
Retic Count, Absolute: 30.2 10*3/uL (ref 19.0–186.0)
Retic Ct Pct: 1 % (ref 0.4–3.1)

## 2020-12-31 LAB — FERRITIN: Ferritin: 1136 ng/mL — ABNORMAL HIGH (ref 11–307)

## 2020-12-31 MED ORDER — SODIUM CHLORIDE 0.9 % IV SOLN
Freq: Once | INTRAVENOUS | Status: AC
Start: 2020-12-31 — End: 2020-12-31
  Filled 2020-12-31: qty 250

## 2020-12-31 MED ORDER — SODIUM CHLORIDE 0.9% FLUSH
10.0000 mL | INTRAVENOUS | Status: DC | PRN
  Administered 2020-12-31: 10 mL
  Filled 2020-12-31: qty 10

## 2020-12-31 MED ORDER — PROCHLORPERAZINE MALEATE 10 MG PO TABS
10.0000 mg | ORAL_TABLET | Freq: Once | ORAL | Status: AC
  Administered 2020-12-31: 10 mg via ORAL

## 2020-12-31 MED ORDER — SODIUM CHLORIDE 0.9 % IV SOLN
1.4000 mg/m2 | Freq: Once | INTRAVENOUS | Status: AC
  Administered 2020-12-31: 2.75 mg via INTRAVENOUS
  Filled 2020-12-31: qty 5.5

## 2020-12-31 MED ORDER — HEPARIN SOD (PORK) LOCK FLUSH 100 UNIT/ML IV SOLN
500.0000 [IU] | Freq: Once | INTRAVENOUS | Status: AC | PRN
  Administered 2020-12-31: 500 [IU]
  Filled 2020-12-31: qty 5

## 2020-12-31 NOTE — Progress Notes (Signed)
Rebecca [name]  [phone] [phone]) [phone]   ID: Rebecca [name] DOB: 03-25-1976  MR#: [id number]  EXN#:[id number]  Patient Care Team: Rebecca Dew, FNP as PCP - General (Family Medicine) Rebecca Overall, MD as Consulting Physician (General Surgery) Rebecca [name], Rebecca Dad, MD as Consulting Physician (Oncology) Rebecca Pray, MD as Consulting Physician (Radiation Oncology) Rebecca [name], Charlestine Massed, NP as Nurse Practitioner (Hematology and Oncology) Rebecca Berthold, DO as Consulting Physician (Neurology) OTHER MD:  CHIEF COMPLAINT: triple negative stage IV breast cancer  CURRENT TREATMENT: eribulin   INTERVAL HISTORY: Rebecca [name] returns today for follow up and treatment of her triple negative metastatic breast cancer.  She was switched to Navelbine on 10/15/2020 due to documented disease progression on chest CT 10/06/2020.  She received this drug days 1 and 8 of each 21-day cycle. She contacted [place] on 11/03/2020 requesting to change her treatment as she is not tolerating it well.  She is not receiving regulant.  This was started 11/12/2020.  She is treated days 1 and 8 of each 21-day cycle.  She omitted the day 8 treatment with cycle 2 because her son was in the hospital with sickle cell crisis and positive Covid.  She herself tested negative.  We have also been following her CA 27-29: Results for Rebecca, [name] (MRN [id number]) as of 12/31/2020 11:34  Ref. Range 08/27/2020 08:53 09/24/2020 10:29 10/15/2020 08:20 11/11/2020 12:46 12/11/2020 14:47  CA 27.29 Latest Ref Range: 0.0 - 38.6 U/mL 238.4 (H) 246.1 (H) 261.8 (H) 297.2 (H) 237.8 (H)   REVIEW OF SYSTEMS: Rebecca [name] tells me she is "fine".  She is hooking up with [facility] in [place] and hopes to be able to go there for 2nd opinion within the next week or 2.  She also got some fermented [place] tail mushrooms that she would like to try.  She continues to bandage her chest wound to keep it clean and  dry and with a pink base.  She has had no fever, bleeding, or uncontrolled pain.  A detailed review of systems today was otherwise stable   COVID 19 VACCINATION STATUS: She received Moderna x2.,  Most recently in February.    BREAST CANCER HISTORY: From the original intake note:  Rebecca [name] herself noted a change in her right breast sometime around September or October 2016. She did not bring it to intermediate medical attention, but on 01/13/2016 she established herself in Dr. Smith [name]' service and she was set up for bilateral diagnostic mammography with tomosynthesis and bilateral ultrasonography at the [facility] 01/19/2016. The breast density was category B. In the upper outer quadrant of the right breast there was a spiculated mass measuring 2.8 cm. On physical exam this was palpable. Targeted ultrasonography confirmed an irregular hypoechoic mass in the right breast 11:30 o'clock position measuring 2.6 cm maximally. Ultrasound of the right axilla showed a morphologically abnormal lymph node.  In the left breast there were some tubular densities behind the areola which by ultrasonography showed benign ductal ectasia.  On 01/28/2016 Rebecca [name] underwent biopsy of the right breast mass and abnormal right axillary lymph node. The pathology from this procedure (S [id number]) showed the lymph node to be benign. In the breast however there was an invasive ductal carcinoma, grade 3, which was estrogen and progesterone receptor negative. The proliferation marker was 70%. HER-2 was not amplified with a signals ratio of 1.32. The number per cell was 2.05.  The patient's subsequent history is as detailed below  PAST MEDICAL HISTORY: Past Medical History:  Diagnosis Date  . Anxiety   . Breast cancer ([place])   . Depression   . GERD (gastroesophageal reflux disease)   . History of radiation therapy 11/15/16-01/12/17   right chest wall and axilla treated to 45 Gy in 25 fractions, boosted and additional 14 Gy in  8 fractions  . Hypertension    diet controlled  . Obesity (BMI 35.0-39.9 without comorbidity)   . Pneumonia    as a child  . Seasonal allergies   . Sickle cell trait ([place])   . Termination of pregnancy (fetus) 04/02/16    PAST SURGICAL HISTORY: Past Surgical History:  Procedure Laterality Date  . CESAREAN SECTION     2004 and 2007  . MASTECTOMY W/ SENTINEL NODE BIOPSY Right 09/06/2016   Procedure: RIGHT BREAST MASTECTOMY WITH RIGHT AXILLARY SENTINEL LYMPH NODE BIOPSY;  Surgeon: Rebecca Overall, MD;  Location: [facility];  Service: General;  Laterality: Right;  . PORT-A-CATH REMOVAL Left 09/06/2016   Procedure: REMOVAL PORT-A-CATH;  Surgeon: Rebecca Overall, MD;  Location: [facility];  Service: General;  Laterality: Left;  . PORTACATH PLACEMENT    . PORTACATH PLACEMENT N/A 07/09/2019   Procedure: INSERTION PORT-A-CATH WITH ULTRASOUND;  Surgeon: Rebecca Overall, MD;  Location: [facility] OR;  Service: General;  Laterality: N/A;    FAMILY HISTORY Family History  Problem Relation Age of Onset  . Hypertension Mother   . Cancer Mother        dx "intestinal cancer" in her 26s; +surgery  . Other Mother        hysterectomy at young age for unspecified cause  . Heart Problems Mother   . Breast cancer Cousin        maternal 1st cousin dx female breast cancer at 7-46y  . Cancer Father   . Hypertension Father   . Heart Problems Maternal Aunt   . Diabetes Maternal Aunt   . Breast cancer Maternal Uncle        dx 64-65  . Heart Problems Maternal Uncle   . Breast cancer Maternal Grandmother 50  . Throat cancer Maternal Grandfather        d. 28s; smoker  . Sickle cell anemia Paternal Aunt   . Congestive Heart Failure Maternal Aunt   . Multiple sclerosis Cousin   . Cancer Other        maternal great uncle (MGM's brother); cancer removed from his side  . Heart attack Paternal Aunt        d. early 40s  The patient has very little information about her father. Her mother is  currently 50 years old. She had a history of cervical cancer at age 37. The patient had 2 brothers, no sisters. The maternal grandfather had throat cancer. A maternal uncle was diagnosed with breast cancer as well as prostate cancer at the age of 35. 2 maternal cousins, one of them female, had breast cancer as well.   GYNECOLOGIC HISTORY:  No LMP recorded. Menarche age 38, first live birth age 71. The patient is GX P4. She was still having regular periods at the time of diagnosis. She took oral contraceptives in the 1990s with no side effects.--.  Stopped with chemotherapy and have not resumed as of May 2019   SOCIAL HISTORY:  (Updated  January 2022 ) She works on and off as a Market researcher.  At home with the patient are her 3 children [name], Wilton and Lilly.  2 of them are disabled or have significant health problems, one with sickle cell disease, the other with autism and developmental delay.  The patient's son [name], currently 45 years old, lives in [place].  The patient's mother is currently living with her   ADVANCED DIRECTIVES: Not in place   HEALTH MAINTENANCE: Social History   Tobacco Use  . Smoking status: Former Smoker    Packs/day: 1.00    Years: 20.00    Pack years: 20.00    Types: Cigarettes    Quit date: 04/01/2018    Years since quitting: 2.7  . Smokeless tobacco: Never Used  . Tobacco comment: Patient has quit smoking x 1 year now  Vaping Use  . Vaping Use: Never used  Substance Use Topics  . Alcohol use: Yes    Comment: occ  . Drug use: No    Colonoscopy:  PAP:  Bone density:  Lipid panel:  No Known Allergies  Current Outpatient Medications on File Prior to Visit  Medication Sig Dispense Refill  . LORazepam (ATIVAN) 0.5 MG tablet Take 1 tablet (0.5 mg total) by mouth daily as needed for anxiety. 30 tablet 0  . methadone (DOLOPHINE) 5 MG tablet Take 1 tablet (5 mg total) by mouth every 8 (eight) hours. 90 tablet 0   . polyethylene glycol (MIRALAX / GLYCOLAX) 17 g packet Take 17 g by mouth daily. 14 each 0   No current facility-administered medications on file prior to visit.    OBJECTIVE: African-American woman who appears stated age  37:   12/31/20 1119  BP: 116/85  Pulse: (!) 134  Resp: 19  Temp: 97.8 F (36.6 C)  SpO2: 100%   Wt Readings from Last 3 Encounters:  12/31/20 174 lb 11.2 oz (79.2 kg)  12/11/20 177 lb (80.3 kg)  11/19/20 181 lb 14.4 oz (82.5 kg)   Body mass index is 34.12 kg/m.    ECOG FS: 1  Sclerae unicteric, EOMs intact Wearing a mask Lungs no rales or rhonchi Heart regular rate and rhythm Abd soft, nontender, positive bowel sounds Neuro: nonfocal, well oriented, appropriate affect Breasts: The right breast is status post mastectomy. There is a cavity in the right chest wall which is imaged below.  It is quite deep and reaches to the fascia of the chest wall.  The left breast shows at least 1 obvious cutaneous lesion which is not ulcerated. This is also imaged below.   Right breast 05/06/2020     Cutaneous mass in left breast 11/11/2020   Right chest wall cavity 11/11/2020    LAB RESULTS: CMP Latest Ref Rng & Units 12/11/2020 11/19/2020 11/11/2020  Glucose 70 - 99 mg/dL 92 110(H) 85  BUN 6 - 20 mg/dL 7 4(L) 6  Creatinine 0.44 - 1.00 mg/dL 0.82 0.75 0.87  Sodium 135 - 145 mmol/L 139 142 141  Potassium 3.5 - 5.1 mmol/L 3.7 3.2(L) 3.5  Chloride 98 - 111 mmol/L 102 100 103  CO2 22 - 32 mmol/L 24 32 27  Calcium 8.9 - 10.3 mg/dL 10.1 9.3 9.8  Total Protein 6.5 - 8.1 g/dL 8.7(H) 7.6 8.0  Total Bilirubin 0.3 - 1.2 mg/dL 0.5 0.4 0.2(L)  Alkaline Phos 38 - 126 U/L 66 52 67  AST 15 - 41 U/L 15 17 14(L)  ALT 0 - 44 U/L _0 CBC    Component Value Date/Time   WBC 13.6 (H) 12/11/2020 1308   RBC 3.17 (L) 12/11/2020 1447  RBC 3.11 (L) 12/11/2020 1308   HGB 8.5 (L) 12/11/2020 1308   HGB 9.4 (L) 08/07/2020 1129   HGB 10.4 (L) 10/04/2016 1154   HCT  25.5 (L) 12/11/2020 1308   HCT 31.3 (L) 10/04/2016 1154   PLT 337 12/11/2020 1308   PLT 309 08/07/2020 1129   PLT 320 10/04/2016 1154   MCV 82.0 12/11/2020 1308   MCV 95.7 10/04/2016 1154   MCH 27.3 12/11/2020 1308   MCHC 33.3 12/11/2020 1308   RDW 19.8 (H) 12/11/2020 1308   RDW 16.3 (H) 10/04/2016 1154   LYMPHSABS 1.5 12/11/2020 1308   LYMPHSABS 1.7 10/04/2016 1154   MONOABS 1.2 (H) 12/11/2020 1308   MONOABS 0.5 10/04/2016 1154   EOSABS 0.0 12/11/2020 1308   EOSABS 0.3 10/04/2016 1154   BASOSABS 0.1 12/11/2020 1308   BASOSABS 0.0 10/04/2016 1154    STUDIES: No results found.   RESEARCH: Referred to PREVENT study, but was pregnant at the time; referred to [facility] 11202, but the biopsied lymph node was benign; referred to weight loss study but declined; referred to [facility] 12/09/2000, but the timing of radiation and 8 was greater than 60 days past the date of diagnosis; referred to health disparity study, but declined; referred to [id number] adjuvant therapy study, but the patient received Xeloda with radiation and therefore was ineligible. Referred to [facility] October 2021 for consideration of studies but did not fit any protocols there at that time   ASSESSMENT: 45 y.o. [place] woman status post right breast upper-outer quadrant biopsy 01/28/2016 for a clinical T2 N0 invasive ductal carcinoma, grade 3, triple negative, with an MIB-1 of 70%.  (a) suspicious right axillary lymph node biopsied 01/28/2016 was benign  (1) neoadjuvant chemotherapy: doxorubicin and cyclophosphamide in dose dense fashion 4 started 04/14/16, completed 05/26/2016, followed by paclitaxel and carboplatin weekly 12, Started 06/09/2016  (a) taxol discontinued after 7 doses because of neuropathy, last dose 07/21/2016  (2) genetics testing October 20, 2016 through the 32-gene Comprehensive Cancer Panel offered by [facility] Junius Roads, MD) (with MSH2 Exons 1-7 Inversion Analysis)  found no deleterious mutations or VUSS  In APC, ATM, AXIN2, BARD1, BMPR1A, BRCA1, BRCA2, BRIP1, CDH1, CDK4, CDKN2A, CHEK2, EPCAM, FANCC, MLH1, MSH2, MSH6, MUTYH, NBN, PALB2, PMS2, POLD1, POLE, PTEN, RAD51C, RAD51D, SCG5/GREM1, SMAD4, STK11, TP53, VHL, and XRCC2.    (3) right mastectomy and sentinel lymph node sampling 09/06/2016 showed a residual ypT1c ypN0, invasive ductal carcinoma, grade 3, with negative margins. Repeat prognostic panel again triple negative   (4) adjuvant radiation with capecitabine/Xeloda sensitization 11/15/16 - 01/12/17 Site/dose:   Right Chest Wall and axilla (4 field) treated to 45 Gy in 25 fractions, and then Boosted an additional 14.4 Gy in 8 fractions.  (5) tobacco abuse disorder: The patient quit smoking 04/04/2018  METASTATIC DISEASE:  July 2020: chest wall, bones, nodes (6) nonspecific changes noted on chest CT 06/11/2019 were clarified by PET scan 06/19/2019 showing hypermetabolic disease in the right anterior chest wall, right internal mammary nodes, right and left axillary nodes, but no metastatic disease in the neck, lungs, abdomen or pelvis.  Bone marrow uptake suggests bony metastatic disease.  (a) CARIS requested obtained from 06/28/2019 sample confirmed a triple negativity, the tumor also was negative for the androgen receptor, was MSI stable and mismatch repair status proficient, with a low mutational burden.  BRCA 1 and 2 were negative and PD-L1 was negative.  PIK3 showed a variant of uncertain significance.  However the tumor was genomic LOH  high  (b) CA-27-29 is informative: was 118.3 on 06/04/2019  (7) zoledronate started 07/17/2019--on hold currently due to dental issues, and until 6 weeks at least after dental work  (8) carboplatin/ gemcitabine days 1 and 8 Q21 day cycle started 07/10/2019, last dose 10/30/2019  (a) restaging studies 09/2019 showed no progression  (b) restaging studies after 6 cycles showed mild disease progression  (9)  cyclophosphamide, methotrexate, fluorouracil chemotherapy started 11/13/2019, repeated every 21 days.  (a) discontinued after cycle 3 (12/31/2019): No evidence of response  (10) started capecitabine 01/21/2020, given 1 week on and 1 week off at standard doses (1500 mg twice daily)   (a) Discontinued after almost three months of treatment (last on 04/06/2020)  (b) Disease progression on 4/27 CT scan showing increasing right chest wall disease, left breast lesions, and ? Liver involvement.  (c) MRI liver 04/14/2020 shows no liver invovlement  (11) Started liposomal doxorubicin/Doxil given on day 1 of a 21 day cycle on 04/07/2020  (a) echo on 04/02/2020 shows EF of 50-55%, repeat in 06/2020  (b) chest CT scan after 4 cycles of Doxil shows some evidence of progression in left axillary and right internal mammary adenopathy  (c) Doxil discontinued after 06/09/2020 dose  (12) started sacituzumab govitecan/ Trodelvy 07/10/2020  (a) day 8 cycle 2 omitted due to febrile neutropenia requiring admission  (b) changed to every two week dosing with Udenyca support after cycle 2  (c) discontinued after 09/24/2020 dose with evidence of disease progression  (13) started navelbine 10/15/2020, to be repeated days 1 and 8 of each 21-day cycle  (a) chest CT scan 10/06/2020 is new baseline study for measurable disease  (b) discontinued after one cycle at patient's request (not tolerated)  (14) started eribulin 11/12/2020, given days 1 and 8 of each 21-day cycle  (a) day 8 cycle 2 omitted because of intercurrent illness in her family  PLAN: Rebecca [name] is tolerating eribulin well.  The plan is to continue through 3 cycles, 2 more doses, and then restage.  She will receive day 1 cycle 3 today and then a the day 8 dose next week.  I reviewed the [place] tail mushroom data and this is approved in [place] for treatment together with cancer so I think it is safe for her to take it.  I have asked her when she goes to the cancer  [facility] in [place] to please give them my fax number as I have had a great deal of difficulty getting their notes in the past and faxing them here is the best way to communicate.  If she also gets me her oncologist email there I would be able to communicate with him or her directly as well.  The patient tells me she intends to name her mother is healthcare power of attorney.  She has the appropriate documents to complete  Total encounter time 25 minutes.Sarajane Jews C. Petro Talent, MD 12/31/20 11:32 AM Medical Oncology and Hematology [facility] [place], [place] [address] Tel. [phone]    Fax. [phone]   I, Wilburn Mylar, am acting as scribe for Dr. Virgie [name]. Aster Screws.  I, Lurline Del MD, have reviewed the above documentation for accuracy and completeness, and I agree with the above.   *Total Encounter Time as defined by the Centers for Medicare and Medicaid Services includes, in addition to the face-to-face time of a patient visit (documented in the note above) non-face-to-face time: obtaining and reviewing outside history, ordering and  reviewing medications, tests or procedures, care coordination (communications with other health care professionals or caregivers) and documentation in the medical record.

## 2020-12-31 NOTE — Progress Notes (Signed)
MD states okay to treat with HBG 7.9

## 2020-12-31 NOTE — Patient Instructions (Signed)
[facility] Discharge Instructions for Patients Receiving Chemotherapy  Today you received the following chemotherapy agents Eribulin  To help prevent nausea and vomiting after your treatment, we encourage you to take your nausea medication as directed,   If you develop nausea and vomiting that is not controlled by your nausea medication, call the clinic.   BELOW ARE SYMPTOMS THAT SHOULD BE REPORTED IMMEDIATELY:  *FEVER GREATER THAN 100.5 F  *CHILLS WITH OR WITHOUT FEVER  NAUSEA AND VOMITING THAT IS NOT CONTROLLED WITH YOUR NAUSEA MEDICATION  *UNUSUAL SHORTNESS OF BREATH  *UNUSUAL BRUISING OR BLEEDING  TENDERNESS IN MOUTH AND THROAT WITH OR WITHOUT PRESENCE OF ULCERS  *URINARY PROBLEMS  *BOWEL PROBLEMS  UNUSUAL RASH Items with * indicate a potential emergency and should be followed up as soon as possible.  Feel free to call the clinic should you have any questions or concerns. The clinic phone number is [phone].  Please show the [place] at check-in to the Emergency Department and triage nurse.

## 2021-01-01 ENCOUNTER — Telehealth: Payer: Self-pay | Admitting: *Deleted

## 2021-01-01 ENCOUNTER — Other Ambulatory Visit: Payer: Self-pay | Admitting: *Deleted

## 2021-01-01 ENCOUNTER — Other Ambulatory Visit: Payer: Self-pay

## 2021-01-01 ENCOUNTER — Inpatient Hospital Stay: Payer: Medicaid Other

## 2021-01-01 DIAGNOSIS — Z5112 Encounter for antineoplastic immunotherapy: Secondary | ICD-10-CM | POA: Diagnosis not present

## 2021-01-01 DIAGNOSIS — D649 Anemia, unspecified: Secondary | ICD-10-CM

## 2021-01-01 LAB — CANCER ANTIGEN 27.29: CA 27.29: 298.2 U/mL — ABNORMAL HIGH (ref 0.0–38.6)

## 2021-01-01 LAB — PREPARE RBC (CROSSMATCH)

## 2021-01-01 MED ORDER — DIPHENHYDRAMINE HCL 25 MG PO CAPS
ORAL_CAPSULE | ORAL | Status: AC
  Filled 2021-01-01: qty 1

## 2021-01-01 MED ORDER — ACETAMINOPHEN 325 MG PO TABS
ORAL_TABLET | ORAL | Status: AC
  Filled 2021-01-01: qty 2

## 2021-01-01 MED ORDER — ACETAMINOPHEN 325 MG PO TABS
650.0000 mg | ORAL_TABLET | Freq: Once | ORAL | Status: AC
  Administered 2021-01-01: 650 mg via ORAL

## 2021-01-01 MED ORDER — SODIUM CHLORIDE 0.9% FLUSH
10.0000 mL | INTRAVENOUS | Status: AC | PRN
  Administered 2021-01-01: 10 mL
  Filled 2021-01-01: qty 10

## 2021-01-01 MED ORDER — DIPHENHYDRAMINE HCL 25 MG PO CAPS
25.0000 mg | ORAL_CAPSULE | Freq: Once | ORAL | Status: AC
  Administered 2021-01-01: 25 mg via ORAL

## 2021-01-01 MED ORDER — HEPARIN SOD (PORK) LOCK FLUSH 100 UNIT/ML IV SOLN
500.0000 [IU] | Freq: Every day | INTRAVENOUS | Status: AC | PRN
  Administered 2021-01-01: 500 [IU]
  Filled 2021-01-01: qty 5

## 2021-01-01 MED ORDER — SODIUM CHLORIDE 0.9% IV SOLUTION
250.0000 mL | Freq: Once | INTRAVENOUS | Status: AC
  Administered 2021-01-01: 250 mL via INTRAVENOUS
  Filled 2021-01-01: qty 250

## 2021-01-02 LAB — BPAM RBC
Blood Product Expiration Date: 202202242359
ISSUE DATE / TIME: 202201281418
Unit Type and Rh: 5100

## 2021-01-02 LAB — TYPE AND SCREEN
ABO/RH(D): O POS
Antibody Screen: NEGATIVE
Unit division: 0

## 2021-01-04 ENCOUNTER — Telehealth: Payer: Self-pay | Admitting: Oncology

## 2021-01-04 NOTE — Telephone Encounter (Signed)
Scheduled per 1/27 los. Pt will receive an updated appt calendar per next visit appt notes

## 2021-01-07 ENCOUNTER — Inpatient Hospital Stay: Payer: Medicaid Other

## 2021-01-07 ENCOUNTER — Other Ambulatory Visit: Payer: Self-pay

## 2021-01-07 ENCOUNTER — Inpatient Hospital Stay: Payer: Medicaid Other | Attending: Oncology

## 2021-01-07 ENCOUNTER — Inpatient Hospital Stay (HOSPITAL_BASED_OUTPATIENT_CLINIC_OR_DEPARTMENT_OTHER): Payer: Medicaid Other | Admitting: Medical

## 2021-01-07 ENCOUNTER — Telehealth: Payer: Self-pay | Admitting: *Deleted

## 2021-01-07 VITALS — BP 118/73 | HR 124 | Temp 98.8°F | Resp 18 | Wt 176.0 lb

## 2021-01-07 DIAGNOSIS — C7951 Secondary malignant neoplasm of bone: Secondary | ICD-10-CM

## 2021-01-07 DIAGNOSIS — C50411 Malignant neoplasm of upper-outer quadrant of right female breast: Secondary | ICD-10-CM

## 2021-01-07 DIAGNOSIS — K219 Gastro-esophageal reflux disease without esophagitis: Secondary | ICD-10-CM | POA: Diagnosis not present

## 2021-01-07 DIAGNOSIS — Z79899 Other long term (current) drug therapy: Secondary | ICD-10-CM | POA: Diagnosis not present

## 2021-01-07 DIAGNOSIS — F418 Other specified anxiety disorders: Secondary | ICD-10-CM | POA: Insufficient documentation

## 2021-01-07 DIAGNOSIS — Z5111 Encounter for antineoplastic chemotherapy: Secondary | ICD-10-CM | POA: Insufficient documentation

## 2021-01-07 DIAGNOSIS — C50919 Malignant neoplasm of unspecified site of unspecified female breast: Secondary | ICD-10-CM

## 2021-01-07 DIAGNOSIS — I10 Essential (primary) hypertension: Secondary | ICD-10-CM | POA: Diagnosis not present

## 2021-01-07 DIAGNOSIS — Z171 Estrogen receptor negative status [ER-]: Secondary | ICD-10-CM

## 2021-01-07 DIAGNOSIS — E669 Obesity, unspecified: Secondary | ICD-10-CM | POA: Diagnosis not present

## 2021-01-07 DIAGNOSIS — Z809 Family history of malignant neoplasm, unspecified: Secondary | ICD-10-CM | POA: Insufficient documentation

## 2021-01-07 DIAGNOSIS — D571 Sickle-cell disease without crisis: Secondary | ICD-10-CM | POA: Diagnosis not present

## 2021-01-07 DIAGNOSIS — Z170 Estrogen receptor positive status [ER+]: Secondary | ICD-10-CM | POA: Diagnosis not present

## 2021-01-07 DIAGNOSIS — D649 Anemia, unspecified: Secondary | ICD-10-CM

## 2021-01-07 DIAGNOSIS — Z87891 Personal history of nicotine dependence: Secondary | ICD-10-CM | POA: Diagnosis not present

## 2021-01-07 DIAGNOSIS — Z9011 Acquired absence of right breast and nipple: Secondary | ICD-10-CM | POA: Diagnosis not present

## 2021-01-07 DIAGNOSIS — Z95828 Presence of other vascular implants and grafts: Secondary | ICD-10-CM

## 2021-01-07 LAB — COMPREHENSIVE METABOLIC PANEL
ALT: 12 U/L (ref 0–44)
AST: 21 U/L (ref 15–41)
Albumin: 3.5 g/dL (ref 3.5–5.0)
Alkaline Phosphatase: 55 U/L (ref 38–126)
Anion gap: 9 (ref 5–15)
BUN: 6 mg/dL (ref 6–20)
CO2: 29 mmol/L (ref 22–32)
Calcium: 9 mg/dL (ref 8.9–10.3)
Chloride: 100 mmol/L (ref 98–111)
Creatinine, Ser: 0.74 mg/dL (ref 0.44–1.00)
GFR, Estimated: >60 mL/min (ref >60)
Glucose, Bld: 88 mg/dL (ref 70–99)
Potassium: 3.2 mmol/L — ABNORMAL LOW (ref 3.5–5.1)
Sodium: 138 mmol/L (ref 135–145)
Total Bilirubin: 0.4 mg/dL (ref 0.3–1.2)
Total Protein: 7.9 g/dL (ref 6.5–8.1)

## 2021-01-07 LAB — CBC WITH DIFFERENTIAL/PLATELET
Abs Immature Granulocytes: 0.05 10*3/uL (ref 0.00–0.07)
Basophils Absolute: 0.1 10*3/uL (ref 0.0–0.1)
Basophils Relative: 2 %
Eosinophils Absolute: 0 10*3/uL (ref 0.0–0.5)
Eosinophils Relative: 0 %
HCT: 26.7 % — ABNORMAL LOW (ref 36.0–46.0)
Hemoglobin: 8.6 g/dL — ABNORMAL LOW (ref 12.0–15.0)
Immature Granulocytes: 2 %
Lymphocytes Relative: 50 %
Lymphs Abs: 1.3 10*3/uL (ref 0.7–4.0)
MCH: 26.8 pg (ref 26.0–34.0)
MCHC: 32.2 g/dL (ref 30.0–36.0)
MCV: 83.2 fL (ref 80.0–100.0)
Monocytes Absolute: 0.5 10*3/uL (ref 0.1–1.0)
Monocytes Relative: 18 %
Neutro Abs: 0.7 10*3/uL — ABNORMAL LOW (ref 1.7–7.7)
Neutrophils Relative %: 28 %
Platelets: 220 10*3/uL (ref 150–400)
RBC: 3.21 MIL/uL — ABNORMAL LOW (ref 3.87–5.11)
RDW: 18.4 % — ABNORMAL HIGH (ref 11.5–15.5)
WBC: 2.6 10*3/uL — ABNORMAL LOW (ref 4.0–10.5)
nRBC: 0 % (ref 0.0–0.2)

## 2021-01-07 LAB — TYPE AND SCREEN
ABO/RH(D): O POS
Antibody Screen: NEGATIVE

## 2021-01-07 MED ORDER — SODIUM CHLORIDE 0.9% FLUSH
10.0000 mL | Freq: Once | INTRAVENOUS | Status: AC
  Administered 2021-01-07: 3 mL via INTRAVENOUS
  Filled 2021-01-07: qty 10

## 2021-01-07 MED ORDER — HEPARIN SOD (PORK) LOCK FLUSH 100 UNIT/ML IV SOLN
500.0000 [IU] | Freq: Once | INTRAVENOUS | Status: AC
  Administered 2021-01-07: 500 [IU] via INTRAVENOUS
  Filled 2021-01-07: qty 5

## 2021-01-07 MED ORDER — SODIUM CHLORIDE 0.9% FLUSH
10.0000 mL | INTRAVENOUS | Status: DC | PRN
  Administered 2021-01-07: 10 mL
  Filled 2021-01-07: qty 10

## 2021-01-07 NOTE — Patient Instructions (Signed)

## 2021-01-07 NOTE — Patient Instructions (Signed)
[facility] Discharge Instructions for Patients Receiving Chemotherapy  Today you received the following chemotherapy agents: eribulin.  To help prevent nausea and vomiting after your treatment, we encourage you to take your nausea medication as directed.   If you develop nausea and vomiting that is not controlled by your nausea medication, call the clinic.   BELOW ARE SYMPTOMS THAT SHOULD BE REPORTED IMMEDIATELY:  *FEVER GREATER THAN 100.5 F  *CHILLS WITH OR WITHOUT FEVER  NAUSEA AND VOMITING THAT IS NOT CONTROLLED WITH YOUR NAUSEA MEDICATION  *UNUSUAL SHORTNESS OF BREATH  *UNUSUAL BRUISING OR BLEEDING  TENDERNESS IN MOUTH AND THROAT WITH OR WITHOUT PRESENCE OF ULCERS  *URINARY PROBLEMS  *BOWEL PROBLEMS  UNUSUAL RASH Items with * indicate a potential emergency and should be followed up as soon as possible.  Feel free to call the clinic should you have any questions or concerns. The clinic phone number is [phone].  Please show the CHEMO ALERT CARD at check-in to the Emergency Department and triage nurse.   

## 2021-01-07 NOTE — Progress Notes (Signed)
Per Lucianne Lei, patient is safe to go home at this time. Patient was ambulatory from infusion clinic with no complaints. Patient was instructed to notify the infusion clinic if she develops a fever or any other symptoms. Patient verbalized understanding.

## 2021-01-07 NOTE — Telephone Encounter (Signed)
Per Rebecca [name] will not treat pt with 0.7 ANC. Also request that [employer] further evaluate pt heart rate of 124. Infusion nurse [email or web address],EZ made aware.

## 2021-01-08 NOTE — Progress Notes (Signed)
This provider was called to the infusion room to see a patient who had a pulse rate of 124.  On getting to the infusion room the patient was assessed and found to have a manual pulse of 92 bpm.  Her temperature was 98.6.  The patient was asymptomatic.  She was told to contact her office should she have any issues of concern.  She expressed understanding and agreement with this plan.  Sandi Mealy, MHS, PA-C Physician Assistant

## 2021-01-13 NOTE — Telephone Encounter (Signed)
Error. Shelsy Seng M Tahari Clabaugh, RN  

## 2021-01-20 NOTE — Progress Notes (Signed)
[name]  Telephone:(336) [phone] Fax:(336) [phone]   ID: Unknown Rebecca [name] DOB: 1976-03-28  MR#: [id number]  JGG#:[id number]  Patient Care Team: Dorena Dew, FNP as PCP - General (Family Medicine) Alphonsa Overall, MD as Consulting Physician (General Surgery) Bryndon Cumbie, [name], MD as Consulting Physician (Oncology) Gery Pray, MD as Consulting Physician (Radiation Oncology) Delice Bison, Charlestine Massed, NP as Nurse Practitioner (Hematology and Oncology) Alda Berthold, DO as Consulting Physician (Neurology) OTHER MD:  CHIEF COMPLAINT: triple negative stage IV breast cancer  CURRENT TREATMENT: eribulin every 2-week   INTERVAL HISTORY: Rebecca [name] returns today for follow up and treatment of her triple negative metastatic breast cancer.  She continues on eribulin.  This was started 11/12/2020.  She was initially treated days 1 and 8 of each 21-day cycle with this resulted in very low counts so she was switched to every 2-week treatments.  Today is her fifth dose of 6 planned before we restage.  We have also been following her CA 27-29: Lab Results  Component Value Date   CA2729 298.2 (H) 12/31/2020   CA2729 237.8 (H) 12/11/2020   CA2729 297.2 (H) 11/11/2020   CA2729 261.8 (H) 10/15/2020   CA2729 246.1 (H) 09/24/2020    REVIEW OF SYSTEMS: Colletta tells me things are "fine".  Her pain is well controlled on methadone which she says she sometimes takes twice and sometimes 3 times a day.  She is using MiraLAX and stool softeners appropriately and does not have constipation.  She does not get particularly tired or nauseated from the regulant and is not having side effects from it that she is aware of.  She says the right chest wound is okay and on the left breast where she has some nodules one of the nodules is becoming slightly ulcerated.   COVID 19 VACCINATION STATUS: She received Moderna x2., most recently in February 2021.    BREAST CANCER HISTORY: From the original  intake note:  Rebecca [name] herself noted a change in her right breast sometime around September or October 2016. She did not bring it to intermediate medical attention, but on 01/13/2016 she established herself in Dr. Smith Robert' service and she was set up for bilateral diagnostic mammography with tomosynthesis and bilateral ultrasonography at the [facility] 01/19/2016. The breast density was category B. In the upper outer quadrant of the right breast there was a spiculated mass measuring 2.8 cm. On physical exam this was palpable. Targeted ultrasonography confirmed an irregular hypoechoic mass in the right breast 11:30 o'clock position measuring 2.6 cm maximally. Ultrasound of the right axilla showed a morphologically abnormal lymph node.  In the left breast there were some tubular densities behind the areola which by ultrasonography showed benign ductal ectasia.  On 01/28/2016 [place] underwent biopsy of the right breast mass and abnormal right axillary lymph node. The pathology from this procedure (S [id number]) showed the lymph node to be benign. In the breast however there was an invasive ductal carcinoma, grade 3, which was estrogen and progesterone receptor negative. The proliferation marker was 70%. HER-2 was not amplified with a signals ratio of 1.32. The number per cell was 2.05.  The patient's subsequent history is as detailed below   PAST MEDICAL HISTORY: Past Medical History:  Diagnosis Date  . Anxiety   . Breast cancer ([place])   . Depression   . GERD (gastroesophageal reflux disease)   . History of radiation therapy 11/15/16-01/12/17   right chest wall and axilla treated to 45 Gy in  25 fractions, boosted and additional 14 Gy in 8 fractions  . Hypertension    diet controlled  . Obesity (BMI 35.0-39.9 without comorbidity)   . Pneumonia    as a child  . Seasonal allergies   . Sickle cell trait ([place])   . Termination of pregnancy (fetus) 04/02/16    PAST SURGICAL HISTORY: Past Surgical  History:  Procedure Laterality Date  . CESAREAN SECTION     2004 and 2007  . MASTECTOMY W/ SENTINEL NODE BIOPSY Right 09/06/2016   Procedure: RIGHT BREAST MASTECTOMY WITH RIGHT AXILLARY SENTINEL LYMPH NODE BIOPSY;  Surgeon: Alphonsa Overall, MD;  Location: [facility];  Service: General;  Laterality: Right;  . PORT-A-CATH REMOVAL Left 09/06/2016   Procedure: REMOVAL PORT-A-CATH;  Surgeon: Alphonsa Overall, MD;  Location: [facility];  Service: General;  Laterality: Left;  . PORTACATH PLACEMENT    . PORTACATH PLACEMENT N/A 07/09/2019   Procedure: INSERTION PORT-A-CATH WITH ULTRASOUND;  Surgeon: Alphonsa Overall, MD;  Location: [facility] OR;  Service: General;  Laterality: N/A;    FAMILY HISTORY Family History  Problem Relation Age of Onset  . Hypertension Mother   . Cancer Mother        dx "intestinal cancer" in her 71s; +surgery  . Other Mother        hysterectomy at young age for unspecified cause  . Heart Problems Mother   . Breast cancer Cousin        maternal 1st cousin dx female breast cancer at 17-46y  . Cancer Father   . Hypertension Father   . Heart Problems Maternal Aunt   . Diabetes Maternal Aunt   . Breast cancer Maternal Uncle        dx 64-65  . Heart Problems Maternal Uncle   . Breast cancer Maternal Grandmother 51  . Throat cancer Maternal Grandfather        d. 65s; smoker  . Sickle cell anemia Paternal Aunt   . Congestive Heart Failure Maternal Aunt   . Multiple sclerosis Cousin   . Cancer Other        maternal great uncle (MGM's brother); cancer removed from his side  . Heart attack Paternal Aunt        d. early 55s  The patient has very little information about her father. Her mother is currently 71 years old. She had a history of cervical cancer at age 39. The patient had 2 brothers, no sisters. The maternal grandfather had throat cancer. A maternal uncle was diagnosed with breast cancer as well as prostate cancer at the age of 77. 2 maternal cousins,  one of them female, had breast cancer as well.   GYNECOLOGIC HISTORY:  No LMP recorded. Menarche age 86, first live birth age 33. The patient is GX P4. She was still having regular periods at the time of diagnosis. She took oral contraceptives in the 1990s with no side effects.--.  Stopped with chemotherapy and have not resumed as of May 2019   SOCIAL HISTORY:  (Updated  January 2022 ) She works on and off as a Market researcher.  At home with the patient are her 3 children [name], [place] and [place].  2 of them are disabled or have significant health problems, one with sickle cell disease, the other with autism and developmental delay.  The patient's son [name], currently 53 years old, lives in [place].  The patient's mother is currently living with her   ADVANCED  DIRECTIVES: Not in place   HEALTH MAINTENANCE: Social History   Tobacco Use  . Smoking status: Former Smoker    Packs/day: 1.00    Years: 20.00    Pack years: 20.00    Types: Cigarettes    Quit date: 04/01/2018    Years since quitting: 2.8  . Smokeless tobacco: Never Used  . Tobacco comment: Patient has quit smoking x 1 year now  Vaping Use  . Vaping Use: Never used  Substance Use Topics  . Alcohol use: Yes    Comment: occ  . Drug use: No    Colonoscopy:  PAP:  Bone density:  Lipid panel:  No Known Allergies  Current Outpatient Medications on File Prior to Visit  Medication Sig Dispense Refill  . LORazepam (ATIVAN) 0.5 MG tablet Take 1 tablet (0.5 mg total) by mouth daily as needed for anxiety. 30 tablet 0  . methadone (DOLOPHINE) 5 MG tablet Take 1 tablet (5 mg total) by mouth every 8 (eight) hours. 90 tablet 0  . polyethylene glycol (MIRALAX / GLYCOLAX) 17 g packet Take 17 g by mouth daily. 14 each 0   No current facility-administered medications on file prior to visit.    OBJECTIVE: African-American woman who appears stated age  87:   01/21/21 1250  BP: 107/67   Pulse: 96  Resp: 20  Temp: 97.7 F (36.5 C)  SpO2: 98%   Wt Readings from Last 3 Encounters:  01/21/21 174 lb (78.9 kg)  01/07/21 176 lb (79.8 kg)  12/31/20 174 lb 11.2 oz (79.2 kg)   Body mass index is 33.98 kg/m.    ECOG FS: 1  Sclerae unicteric, EOMs intact Wearing a mask No cervical or supraclavicular adenopathy Lungs no rales or rhonchi Heart regular rate and rhythm Abd soft, nontender, positive bowel sounds MSK no focal spinal tenderness, no upper extremity lymphedema Neuro: nonfocal, well oriented, appropriate affect Breasts: The right chest wound has a yellow to pink base as before.  On the left side the largest breast nodule is slightly ulcerated at the top.  It appears minimally larger than before.    Right breast 05/06/2020     Cutaneous mass in left breast 11/11/2020   Right chest wall cavity 11/11/2020    LAB RESULTS: CMP Latest Ref Rng & Units 01/07/2021 12/31/2020 12/11/2020  Glucose 70 - 99 mg/dL 88 89 92  BUN 6 - 20 mg/dL _0 Creatinine 0.44 - 1.00 mg/dL 0.74 0.86 0.82  Sodium 135 - 145 mmol/L 138 142 139  Potassium 3.5 - 5.1 mmol/L 3.2(L) 3.6 3.7  Chloride 98 - 111 mmol/L 100 104 102  CO2 22 - 32 mmol/L _1 Calcium 8.9 - 10.3 mg/dL 9.0 9.6 10.1  Total Protein 6.5 - 8.1 g/dL 7.9 8.4(H) 8.7(H)  Total Bilirubin 0.3 - 1.2 mg/dL 0.4 0.3 0.5  Alkaline Phos 38 - 126 U/L 55 64 66  AST 15 - 41 U/L _2 ALT 0 - 44 U/L 12 <6 7    CBC    Component Value Date/Time   WBC 13.1 (H) 01/21/2021 1240   RBC 2.96 (L) 01/21/2021 1240   RBC 3.00 (L) 01/21/2021 1240   HGB 8.0 (L) 01/21/2021 1240   HGB 9.4 (L) 08/07/2020 1129   HGB 10.4 (L) 10/04/2016 1154   HCT 24.5 (L) 01/21/2021 1240   HCT 31.3 (L) 10/04/2016 1154   PLT 250 01/21/2021 1240   PLT 309 08/07/2020 1129   PLT 320  10/04/2016 1154   MCV 81.7 01/21/2021 1240   MCV 95.7 10/04/2016 1154   MCH 26.7 01/21/2021 1240   MCHC 32.7 01/21/2021 1240   RDW 19.6 (H) 01/21/2021 1240   RDW  16.3 (H) 10/04/2016 1154   LYMPHSABS 1.7 01/21/2021 1240   LYMPHSABS 1.7 10/04/2016 1154   MONOABS 0.9 01/21/2021 1240   MONOABS 0.5 10/04/2016 1154   EOSABS 0.0 01/21/2021 1240   EOSABS 0.3 10/04/2016 1154   BASOSABS 0.0 01/21/2021 1240   BASOSABS 0.0 10/04/2016 1154    STUDIES: No results found.   RESEARCH: Referred to PREVENT study, but was pregnant at the time; referred to [facility]02, but the biopsied lymph node was benign; referred to weight loss study but declined; referred to [facility] 12/09/2000, but the timing of radiation and 8 was greater than 60 days past the date of diagnosis; referred to health disparity study, but declined; referred to [id number] adjuvant therapy study, but the patient received Xeloda with radiation and therefore was ineligible. Referred to [facility] October 2021 for consideration of studies but did not fit any protocols there at that time   ASSESSMENT: 45 y.o. [place] woman status post right breast upper-outer quadrant biopsy 01/28/2016 for a clinical T2 N0 invasive ductal carcinoma, grade 3, triple negative, with an MIB-1 of 70%.  (a) suspicious right axillary lymph node biopsied 01/28/2016 was benign  (1) neoadjuvant chemotherapy: doxorubicin and cyclophosphamide in dose dense fashion 4 started 04/14/16, completed 05/26/2016, followed by paclitaxel and carboplatin weekly 12, Started 06/09/2016  (a) taxol discontinued after 7 doses because of neuropathy, last dose 07/21/2016  (2) genetics testing October 20, 2016 through the 32-gene Comprehensive Cancer Panel offered by [facility] Junius Roads, MD) (with MSH2 Exons 1-7 Inversion Analysis) found no deleterious mutations or VUSS  In APC, ATM, AXIN2, BARD1, BMPR1A, BRCA1, BRCA2, BRIP1, CDH1, CDK4, CDKN2A, CHEK2, EPCAM, FANCC, MLH1, MSH2, MSH6, MUTYH, NBN, PALB2, PMS2, POLD1, POLE, PTEN, RAD51C, RAD51D, SCG5/GREM1, SMAD4, STK11, TP53, VHL, and XRCC2.    (3) right mastectomy and  sentinel lymph node sampling 09/06/2016 showed a residual ypT1c ypN0, invasive ductal carcinoma, grade 3, with negative margins. Repeat prognostic panel again triple negative   (4) adjuvant radiation with capecitabine/Xeloda sensitization 11/15/16 - 01/12/17 Site/dose:   Right Chest Wall and axilla (4 field) treated to 45 Gy in 25 fractions, and then Boosted an additional 14.4 Gy in 8 fractions.  (5) tobacco abuse disorder: The patient quit smoking 04/04/2018  METASTATIC DISEASE:  July 2020: chest wall, bones, nodes (6) nonspecific changes noted on chest CT 06/11/2019 were clarified by PET scan 06/19/2019 showing hypermetabolic disease in the right anterior chest wall, right internal mammary nodes, right and left axillary nodes, but no metastatic disease in the neck, lungs, abdomen or pelvis.  Bone marrow uptake suggests bony metastatic disease.  (a) CARIS requested obtained from 06/28/2019 sample confirmed a triple negativity, the tumor also was negative for the androgen receptor, was MSI stable and mismatch repair status proficient, with a low mutational burden.  BRCA 1 and 2 were negative and PD-L1 was negative.  PIK3 showed a variant of uncertain significance.  However the tumor was genomic LOH high  (b) CA-27-29 is informative: was 118.3 on 06/04/2019  (7) zoledronate started 07/17/2019--on hold currently due to dental issues, and until 6 weeks at least after dental work  (8) carboplatin/ gemcitabine days 1 and 8 Q21 day cycle started 07/10/2019, last dose 10/30/2019  (a) restaging studies 09/2019 showed no progression  (b) restaging  studies after 6 cycles showed mild disease progression  (9) cyclophosphamide, methotrexate, fluorouracil chemotherapy started 11/13/2019, repeated every 21 days.  (a) discontinued after cycle 3 (12/31/2019): No evidence of response  (10) started capecitabine 01/21/2020, given 1 week on and 1 week off at standard doses (1500 mg twice daily)   (a) Discontinued  after almost three months of treatment (last on 04/06/2020)  (b) Disease progression on 4/27 CT scan showing increasing right chest wall disease, left breast lesions, and ? Liver involvement.  (c) MRI liver 04/14/2020 shows no liver invovlement  (11) Started liposomal doxorubicin/Doxil given on day 1 of a 21 day cycle on 04/07/2020  (a) echo on 04/02/2020 shows EF of 50-55%, repeat in 06/2020  (b) chest CT scan after 4 cycles of Doxil shows some evidence of progression in left axillary and right internal mammary adenopathy  (c) Doxil discontinued after 06/09/2020 dose  (12) started sacituzumab govitecan/ Trodelvy 07/10/2020  (a) day 8 cycle 2 omitted due to febrile neutropenia requiring admission  (b) changed to every two week dosing with Udenyca support after cycle 2  (c) discontinued after 09/24/2020 dose with evidence of disease progression  (13) started navelbine 10/15/2020, to be repeated days 1 and 8 of each 21-day cycle  (a) chest CT scan 10/06/2020 is new baseline study for measurable disease  (b) discontinued after one cycle at patient's request (not tolerated)  (14) started eribulin 11/12/2020, given days 1 and 8 of each 21-day cycle  (a) day 8 cycle 2 omitted because of intercurrent illness in her family   PLAN: Shequita continues to tolerate the regulant generally well.  Whether it is working is a different question.  It is of course very difficult to assess her right chest wound.  The lesions on the left breast appear to have grown slightly and one of them is becoming slightly ulcerated.  On the other hand her tumor markers appear to have stabilized.  She tells me her insurance finally came through and that she is going to be trying to get an appointment with [facility] [place] in [place] within the next week to 10 days.  I asked her to make sure to get in writing what ever suggestions they make so if we can implement them here we can go ahead and do that.  In the past I have had  significant difficulty, communicating with see [facility] physicians.  She is scheduled to return 02/04/2021 for her sixth dose of eribulin.  After that assuming no other changes she will have a restaging CT of the chest and then we will either continue the regulant or switch to a different agent unless the folks at the [facility] [place], with a better suggestion for [place]  She is doing well on the methadone in terms of pain control and is managing the bowel prophylaxis quite well also  She knows to call for any other issue that may develop before the next visit  Total encounter time 25 minutes.Raymond Gurney C. Xaiden Fleig, MD 01/21/21 1:11 PM Medical Oncology and Hematology [facility] [address] [place], [place] [address] Tel. [phone]    Fax. [phone]   I, Mickie Bail, am acting as scribe for Dr. Valentino Hue. Malcomb Gangemi.  I, Ruthann Cancer MD, have reviewed the above documentation for accuracy and completeness, and I agree with the above.   *Total Encounter Time as defined by the Centers for Medicare and Medicaid Services includes, in addition to the face-to-face time  of a patient visit (documented in the note above) non-face-to-face time: obtaining and reviewing outside history, ordering and reviewing medications, tests or procedures, care coordination (communications with other health care professionals or caregivers) and documentation in the medical record.

## 2021-01-21 ENCOUNTER — Inpatient Hospital Stay: Payer: Medicaid Other

## 2021-01-21 ENCOUNTER — Other Ambulatory Visit: Payer: Self-pay

## 2021-01-21 ENCOUNTER — Inpatient Hospital Stay (HOSPITAL_BASED_OUTPATIENT_CLINIC_OR_DEPARTMENT_OTHER): Payer: Medicaid Other | Admitting: Oncology

## 2021-01-21 VITALS — BP 107/67 | HR 96 | Temp 97.7°F | Resp 20 | Ht 60.0 in | Wt 174.0 lb

## 2021-01-21 DIAGNOSIS — Z171 Estrogen receptor negative status [ER-]: Secondary | ICD-10-CM

## 2021-01-21 DIAGNOSIS — C7951 Secondary malignant neoplasm of bone: Secondary | ICD-10-CM

## 2021-01-21 DIAGNOSIS — G620 Drug-induced polyneuropathy: Secondary | ICD-10-CM

## 2021-01-21 DIAGNOSIS — C50919 Malignant neoplasm of unspecified site of unspecified female breast: Secondary | ICD-10-CM

## 2021-01-21 DIAGNOSIS — C50411 Malignant neoplasm of upper-outer quadrant of right female breast: Secondary | ICD-10-CM | POA: Diagnosis not present

## 2021-01-21 DIAGNOSIS — Z5111 Encounter for antineoplastic chemotherapy: Secondary | ICD-10-CM | POA: Diagnosis not present

## 2021-01-21 DIAGNOSIS — Z7189 Other specified counseling: Secondary | ICD-10-CM

## 2021-01-21 LAB — COMPREHENSIVE METABOLIC PANEL
ALT: 7 U/L (ref 0–44)
AST: 17 U/L (ref 15–41)
Albumin: 3.2 g/dL — ABNORMAL LOW (ref 3.5–5.0)
Alkaline Phosphatase: 57 U/L (ref 38–126)
Anion gap: 11 (ref 5–15)
BUN: 9 mg/dL (ref 6–20)
CO2: 26 mmol/L (ref 22–32)
Calcium: 9.2 mg/dL (ref 8.9–10.3)
Chloride: 103 mmol/L (ref 98–111)
Creatinine, Ser: 0.84 mg/dL (ref 0.44–1.00)
GFR, Estimated: >60 mL/min (ref >60)
Glucose, Bld: 96 mg/dL (ref 70–99)
Potassium: 3.9 mmol/L (ref 3.5–5.1)
Sodium: 140 mmol/L (ref 135–145)
Total Bilirubin: 0.3 mg/dL (ref 0.3–1.2)
Total Protein: 7.5 g/dL (ref 6.5–8.1)

## 2021-01-21 LAB — CBC WITH DIFFERENTIAL/PLATELET
Abs Immature Granulocytes: 0.05 10*3/uL (ref 0.00–0.07)
Basophils Absolute: 0 10*3/uL (ref 0.0–0.1)
Basophils Relative: 0 %
Eosinophils Absolute: 0 10*3/uL (ref 0.0–0.5)
Eosinophils Relative: 0 %
HCT: 24.5 % — ABNORMAL LOW (ref 36.0–46.0)
Hemoglobin: 8 g/dL — ABNORMAL LOW (ref 12.0–15.0)
Immature Granulocytes: 0 %
Lymphocytes Relative: 13 %
Lymphs Abs: 1.7 10*3/uL (ref 0.7–4.0)
MCH: 26.7 pg (ref 26.0–34.0)
MCHC: 32.7 g/dL (ref 30.0–36.0)
MCV: 81.7 fL (ref 80.0–100.0)
Monocytes Absolute: 0.9 10*3/uL (ref 0.1–1.0)
Monocytes Relative: 7 %
Neutro Abs: 10.4 10*3/uL — ABNORMAL HIGH (ref 1.7–7.7)
Neutrophils Relative %: 80 %
Platelets: 250 10*3/uL (ref 150–400)
RBC: 3 MIL/uL — ABNORMAL LOW (ref 3.87–5.11)
RDW: 19.6 % — ABNORMAL HIGH (ref 11.5–15.5)
WBC: 13.1 10*3/uL — ABNORMAL HIGH (ref 4.0–10.5)
nRBC: 0 % (ref 0.0–0.2)

## 2021-01-21 LAB — RETICULOCYTES
Immature Retic Fract: 14.3 % (ref 2.3–15.9)
RBC.: 2.96 MIL/uL — ABNORMAL LOW (ref 3.87–5.11)
Retic Count, Absolute: 17.8 10*3/uL — ABNORMAL LOW (ref 19.0–186.0)
Retic Ct Pct: 0.6 % (ref 0.4–3.1)

## 2021-01-21 LAB — FERRITIN: Ferritin: 1274 ng/mL — ABNORMAL HIGH (ref 11–307)

## 2021-01-21 MED ORDER — PROCHLORPERAZINE MALEATE 10 MG PO TABS
10.0000 mg | ORAL_TABLET | Freq: Once | ORAL | Status: AC
  Administered 2021-01-21: 10 mg via ORAL

## 2021-01-21 MED ORDER — HEPARIN SOD (PORK) LOCK FLUSH 100 UNIT/ML IV SOLN
500.0000 [IU] | Freq: Once | INTRAVENOUS | Status: DC | PRN
  Filled 2021-01-21: qty 5

## 2021-01-21 MED ORDER — SODIUM CHLORIDE 0.9% FLUSH
10.0000 mL | INTRAVENOUS | Status: DC | PRN
  Administered 2021-01-21: 10 mL
  Filled 2021-01-21: qty 10

## 2021-01-21 MED ORDER — PROCHLORPERAZINE MALEATE 10 MG PO TABS
ORAL_TABLET | ORAL | Status: AC
  Filled 2021-01-21: qty 1

## 2021-01-21 MED ORDER — SODIUM CHLORIDE 0.9 % IV SOLN
1.4000 mg/m2 | Freq: Once | INTRAVENOUS | Status: AC
  Administered 2021-01-21: 2.75 mg via INTRAVENOUS
  Filled 2021-01-21: qty 5.5

## 2021-01-21 MED ORDER — SODIUM CHLORIDE 0.9 % IV SOLN
Freq: Once | INTRAVENOUS | Status: AC
  Filled 2021-01-21: qty 250

## 2021-01-21 MED ORDER — METHADONE HCL 5 MG PO TABS: 5.0000 mg | ORAL_TABLET | Freq: Three times a day (TID) | ORAL | 0 refills | Status: DC

## 2021-01-21 MED ORDER — SODIUM CHLORIDE 0.9% FLUSH
10.0000 mL | INTRAVENOUS | Status: DC | PRN
  Filled 2021-01-21: qty 10

## 2021-01-21 NOTE — Patient Instructions (Signed)

## 2021-01-21 NOTE — Patient Instructions (Signed)
[facility] Discharge Instructions for Patients Receiving Chemotherapy  Today you received the following chemotherapy agents [place]  To help prevent nausea and vomiting after your treatment, we encourage you to take your nausea medication as directed If you develop nausea and vomiting that is not controlled by your nausea medication, call the clinic.   BELOW ARE SYMPTOMS THAT SHOULD BE REPORTED IMMEDIATELY:  *FEVER GREATER THAN 100.5 F  *CHILLS WITH OR WITHOUT FEVER  NAUSEA AND VOMITING THAT IS NOT CONTROLLED WITH YOUR NAUSEA MEDICATION  *UNUSUAL SHORTNESS OF BREATH  *UNUSUAL BRUISING OR BLEEDING  TENDERNESS IN MOUTH AND THROAT WITH OR WITHOUT PRESENCE OF ULCERS  *URINARY PROBLEMS  *BOWEL PROBLEMS  UNUSUAL RASH Items with * indicate a potential emergency and should be followed up as soon as possible.  Feel free to call the clinic should you have any questions or concerns. The clinic phone number is [phone].  Please show the CHEMO ALERT CARD at check-in to the Emergency Department and triage nurse.   

## 2021-01-22 ENCOUNTER — Telehealth: Payer: Self-pay | Admitting: Oncology

## 2021-01-22 LAB — CANCER ANTIGEN 27.29: CA 27.29: 282.9 U/mL — ABNORMAL HIGH (ref 0.0–38.6)

## 2021-01-22 NOTE — Telephone Encounter (Signed)
Scheduled appts per 2/17 los. Pt confirmed appt date and time.

## 2021-01-28 ENCOUNTER — Inpatient Hospital Stay: Payer: Medicaid Other

## 2021-02-02 ENCOUNTER — Ambulatory Visit (HOSPITAL_COMMUNITY): Payer: Medicaid Other

## 2021-02-03 NOTE — Progress Notes (Signed)
Rebecca [name]  Telephone:([phone]) [phone] Fax:(336) [phone]   ID: Unknown Rebecca [name] DOB: [birth year]  MR#: [id number]  RXV#:[id number]  Patient Care Team: Dorena Dew, FNP as PCP - General (Family Medicine) Alphonsa Overall, MD as Consulting Physician (General Surgery) Amaury Kuzel, [name], MD as Consulting Physician (Oncology) Gery Pray, MD as Consulting Physician (Radiation Oncology) Delice Bison, Charlestine Massed, NP as Nurse Practitioner (Hematology and Oncology) Alda Berthold, DO as Consulting Physician (Neurology) OTHER MD:  CHIEF COMPLAINT: triple negative stage IV breast cancer  CURRENT TREATMENT: eribulin every 2-weeks   INTERVAL HISTORY: Areal returns today for follow up and treatment of her triple negative metastatic breast cancer.  She continues on eribulin. She was initially treated days 1 and 8 of each 21-day cycle with this resulted in very low counts so she was switched to every 2-week treatments. Today is her 6th dose.  She is tolerating this with no significant side effects that she is aware of.  She has very minimal peripheral neuropathy symptoms which have not worsened and are not persistent  We have also been following her CA 27-29: Lab Results  Component Value Date   CA2729 282.9 (H) 01/21/2021   CA2729 298.2 (H) 12/31/2020   CA2729 237.8 (H) 12/11/2020   CA2729 297.2 (H) 11/11/2020   CA2729 261.8 (H) 10/15/2020    REVIEW OF SYSTEMS: [name] tells me she is feeling a little sleepy today because she stayed up late last night.  She tells me her "card" finally came.  She is going to be copying it and faxing it to [facility] in [place] today while she is here and the nurses can help her get that done.  She is hoping to have an appointment within the next 10 days.  She tells me she had a little bit of bleeding from the right chest wall wound, and that she put some cream on it and stopped it.  A detailed review of systems was otherwise  stable   COVID 19 VACCINATION STATUS: She received Moderna x2., most recently in February 2021.    BREAST CANCER HISTORY: From the original intake note:  Rebecca [name] herself noted a change in her right breast sometime around September or October 2016. She did not bring it to intermediate medical attention, but on 01/13/2016 she established herself in Dr. Smith Robert' service and she was set up for bilateral diagnostic mammography with tomosynthesis and bilateral ultrasonography at the [facility] 01/19/2016. The breast density was category B. In the upper outer quadrant of the right breast there was a spiculated mass measuring 2.8 cm. On physical exam this was palpable. Targeted ultrasonography confirmed an irregular hypoechoic mass in the right breast 11:30 o'clock position measuring 2.6 cm maximally. Ultrasound of the right axilla showed a morphologically abnormal lymph node.  In the left breast there were some tubular densities behind the areola which by ultrasonography showed benign ductal ectasia.  On 01/28/2016 Shamariah underwent biopsy of the right breast mass and abnormal right axillary lymph node. The pathology from this procedure (S AAA [id number]) showed the lymph node to be benign. In the breast however there was an invasive ductal carcinoma, grade 3, which was estrogen and progesterone receptor negative. The proliferation marker was 70%. HER-2 was not amplified with a signals ratio of 1.32. The number per cell was 2.05.  The patient's subsequent history is as detailed below   PAST MEDICAL HISTORY: Past Medical History:  Diagnosis Date  . Anxiety   . Breast cancer (  [place])   . Depression   . GERD (gastroesophageal reflux disease)   . History of radiation therapy 11/15/16-01/12/17   right chest wall and axilla treated to 45 Gy in 25 fractions, boosted and additional 14 Gy in 8 fractions  . Hypertension    diet controlled  . Obesity (BMI 35.0-39.9 without comorbidity)   . Pneumonia    as a child   . Seasonal allergies   . Sickle cell trait ([place])   . Termination of pregnancy (fetus) 04/02/16    PAST SURGICAL HISTORY: Past Surgical History:  Procedure Laterality Date  . CESAREAN SECTION     2004 and 2007  . MASTECTOMY W/ SENTINEL NODE BIOPSY Right 09/06/2016   Procedure: RIGHT BREAST MASTECTOMY WITH RIGHT AXILLARY SENTINEL LYMPH NODE BIOPSY;  Surgeon: Alphonsa Overall, MD;  Location: [facility];  Service: General;  Laterality: Right;  . PORT-A-CATH REMOVAL Left 09/06/2016   Procedure: REMOVAL PORT-A-CATH;  Surgeon: Alphonsa Overall, MD;  Location: [facility];  Service: General;  Laterality: Left;  . PORTACATH PLACEMENT    . PORTACATH PLACEMENT N/A 07/09/2019   Procedure: INSERTION PORT-A-CATH WITH ULTRASOUND;  Surgeon: Alphonsa Overall, MD;  Location: [facility];  Service: General;  Laterality: N/A;    FAMILY HISTORY Family History  Problem Relation Age of Onset  . Hypertension Mother   . Cancer Mother        dx "intestinal cancer" in her 53s; +surgery  . Other Mother        hysterectomy at young age for unspecified cause  . Heart Problems Mother   . Breast cancer Cousin        maternal 1st cousin dx female breast cancer at 63-46y  . Cancer Father   . Hypertension Father   . Heart Problems Maternal Aunt   . Diabetes Maternal Aunt   . Breast cancer Maternal Uncle        dx 64-65  . Heart Problems Maternal Uncle   . Breast cancer Maternal Grandmother 53  . Throat cancer Maternal Grandfather        d. 32s; smoker  . Sickle cell anemia Paternal Aunt   . Congestive Heart Failure Maternal Aunt   . Multiple sclerosis Cousin   . Cancer Other        maternal great uncle (MGM's brother); cancer removed from his side  . Heart attack Paternal Aunt        d. early 75s  The patient has very little information about her father. Her mother is currently 52 years old. She had a history of cervical cancer at age 37. The patient had 2 brothers, no sisters. The maternal  grandfather had throat cancer. A maternal uncle was diagnosed with breast cancer as well as prostate cancer at the age of 80. 2 maternal cousins, one of them female, had breast cancer as well.   GYNECOLOGIC HISTORY:  No LMP recorded. Menarche age 28, first live birth age 1. The patient is GX P4. She was still having regular periods at the time of diagnosis. She took oral contraceptives in the 1990s with no side effects.--.  Stopped with chemotherapy and have not resumed as of May 2019   SOCIAL HISTORY:  (Updated  January 2022 ) She works on and off as a Market researcher.  At home with the patient are her 3 children [name], Harper and Chance.  2 of them are disabled or have significant health problems, one with sickle cell disease, the  other with autism and developmental delay.  The patient's son [name], currently 29 years old, lives in [place].  The patient's mother is currently living with her   ADVANCED DIRECTIVES: Not in place   HEALTH MAINTENANCE: Social History   Tobacco Use  . Smoking status: Former Smoker    Packs/day: 1.00    Years: 20.00    Pack years: 20.00    Types: Cigarettes    Quit date: 04/01/2018    Years since quitting: 2.8  . Smokeless tobacco: Never Used  . Tobacco comment: Patient has quit smoking x 1 year now  Vaping Use  . Vaping Use: Never used  Substance Use Topics  . Alcohol use: Yes    Comment: occ  . Drug use: No    Colonoscopy:  PAP:  Bone density:  Lipid panel:  No Known Allergies  Current Outpatient Medications on File Prior to Visit  Medication Sig Dispense Refill  . LORazepam (ATIVAN) 0.5 MG tablet Take 1 tablet (0.5 mg total) by mouth daily as needed for anxiety. 30 tablet 0  . methadone (DOLOPHINE) 5 MG tablet Take 1 tablet (5 mg total) by mouth every 8 (eight) hours. 90 tablet 0  . polyethylene glycol (MIRALAX / GLYCOLAX) 17 g packet Take 17 g by mouth daily. 14 each 0   No current  facility-administered medications on file prior to visit.    OBJECTIVE: African-American woman who appears stated age  45:   02/04/21 1217  BP: 103/64  Pulse: (!) 120  Resp: 18  Temp: 97.9 F (36.6 C)  SpO2: 95%   Wt Readings from Last 3 Encounters:  02/04/21 170 lb 14.4 oz (77.5 kg)  01/21/21 174 lb (78.9 kg)  01/07/21 176 lb (79.8 kg)   Body mass index is 33.38 kg/m.    ECOG FS: 1  Sclerae unicteric, EOMs intact Wearing a mask No cervical or supraclavicular adenopathy Lungs no rales or rhonchi Heart regular rate and rhythm Abd soft, nontender, positive bowel sounds MSK no focal spinal tenderness, no upper extremity lymphedema Neuro: nonfocal, well oriented, appropriate affect Breasts: Bandaged over.   Right breast 05/06/2020     Cutaneous mass in left breast 11/11/2020   Right chest wall cavity 11/11/2020    LAB RESULTS: CMP Latest Ref Rng & Units 02/04/2021 01/21/2021 01/07/2021  Glucose 70 - 99 mg/dL 96 96 88  BUN 6 - 20 mg/dL 5(L) 9 6  Creatinine 0.44 - 1.00 mg/dL 0.77 0.84 0.74  Sodium 135 - 145 mmol/L 139 140 138  Potassium 3.5 - 5.1 mmol/L 3.5 3.9 3.2(L)  Chloride 98 - 111 mmol/L 102 103 100  CO2 22 - 32 mmol/L _0 Calcium 8.9 - 10.3 mg/dL 9.2 9.2 9.0  Total Protein 6.5 - 8.1 g/dL 7.2 7.5 7.9  Total Bilirubin 0.3 - 1.2 mg/dL 0.3 0.3 0.4  Alkaline Phos 38 - 126 U/L 63 57 55  AST 15 - 41 U/L _1 ALT 0 - 44 U/L _2 CBC    Component Value Date/Time   WBC 17.2 (H) 02/04/2021 1146   RBC 2.69 (L) 02/04/2021 1146   HGB 7.1 (L) 02/04/2021 1146   HGB 9.4 (L) 08/07/2020 1129   HGB 10.4 (L) 10/04/2016 1154   HCT 21.7 (L) 02/04/2021 1146   HCT 31.3 (L) 10/04/2016 1154   PLT 373 02/04/2021 1146   PLT 309 08/07/2020 1129   PLT 320 10/04/2016 1154   MCV 80.7 02/04/2021  1146   MCV 95.7 10/04/2016 1154   MCH 26.4 02/04/2021 1146   MCHC 32.7 02/04/2021 1146   RDW 21.1 (H) 02/04/2021 1146   RDW 16.3 (H) 10/04/2016 1154   LYMPHSABS  1.8 02/04/2021 1146   LYMPHSABS 1.7 10/04/2016 1154   MONOABS 1.5 (H) 02/04/2021 1146   MONOABS 0.5 10/04/2016 1154   EOSABS 0.1 02/04/2021 1146   EOSABS 0.3 10/04/2016 1154   BASOSABS 0.1 02/04/2021 1146   BASOSABS 0.0 10/04/2016 1154    STUDIES: No results found.   RESEARCH: Referred to PREVENT study, but was pregnant at the time; referred to [facility]02, but the biopsied lymph node was benign; referred to weight loss study but declined; referred to [facility] 12/09/2000, but the timing of radiation and 8 was greater than 60 days past the date of diagnosis; referred to health disparity study, but declined; referred to [id number] adjuvant therapy study, but the patient received Xeloda with radiation and therefore was ineligible. Referred to [facility] October 2021 for consideration of studies but did not fit any protocols there at that time   ASSESSMENT: 45 y.o. [place] woman status post right breast upper-outer quadrant biopsy 01/28/2016 for a clinical T2 N0 invasive ductal carcinoma, grade 3, triple negative, with an MIB-1 of 70%.  (a) suspicious right axillary lymph node biopsied 01/28/2016 was benign  (1) neoadjuvant chemotherapy: doxorubicin and cyclophosphamide in dose dense fashion 4 started 04/14/16, completed 05/26/2016, followed by paclitaxel and carboplatin weekly 12, Started 06/09/2016  (a) taxol discontinued after 7 doses because of neuropathy, last dose 07/21/2016  (2) genetics testing October 20, 2016 through the 32-gene Comprehensive Cancer Panel offered by [facility] [place], MD) (with MSH2 Exons 1-7 Inversion Analysis) found no deleterious mutations or VUSS  In APC, ATM, AXIN2, BARD1, BMPR1A, BRCA1, BRCA2, BRIP1, CDH1, CDK4, CDKN2A, CHEK2, EPCAM, FANCC, MLH1, MSH2, MSH6, MUTYH, NBN, PALB2, PMS2, POLD1, POLE, PTEN, RAD51C, RAD51D, SCG5/GREM1, SMAD4, STK11, TP53, VHL, and XRCC2.    (3) right mastectomy and sentinel lymph node sampling  09/06/2016 showed a residual ypT1c ypN0, invasive ductal carcinoma, grade 3, with negative margins. Repeat prognostic panel again triple negative   (4) adjuvant radiation with capecitabine/Xeloda sensitization 11/15/16 - 01/12/17 Site/dose:   Right Chest Wall and axilla (4 field) treated to 45 Gy in 25 fractions, and then Boosted an additional 14.4 Gy in 8 fractions.  (5) tobacco abuse disorder: The patient quit smoking 04/04/2018  METASTATIC DISEASE:  July 2020: chest wall, bones, nodes (6) nonspecific changes noted on chest CT 06/11/2019 were clarified by PET scan 06/19/2019 showing hypermetabolic disease in the right anterior chest wall, right internal mammary nodes, right and left axillary nodes, but no metastatic disease in the neck, lungs, abdomen or pelvis.  Bone marrow uptake suggests bony metastatic disease.  (a) CARIS requested obtained from 06/28/2019 sample confirmed a triple negativity, the tumor also was negative for the androgen receptor, was MSI stable and mismatch repair status proficient, with a low mutational burden.  BRCA 1 and 2 were negative and PD-L1 was negative.  PIK3 showed a variant of uncertain significance.  However the tumor was genomic LOH high  (b) CA-27-29 is informative: was 118.3 on 06/04/2019  (7) zoledronate started 07/17/2019--on hold currently due to dental issues, and until 6 weeks at least after dental work  (8) carboplatin/ gemcitabine days 1 and 8 Q21 day cycle started 07/10/2019, last dose 10/30/2019  (a) restaging studies 09/2019 showed no progression  (b) restaging studies after 6 cycles showed mild  disease progression  (9) cyclophosphamide, methotrexate, fluorouracil chemotherapy started 11/13/2019, repeated every 21 days.  (a) discontinued after cycle 3 (12/31/2019): No evidence of response  (10) started capecitabine 01/21/2020, given 1 week on and 1 week off at standard doses (1500 mg twice daily)   (a) Discontinued after almost three months of  treatment (last on 04/06/2020)  (b) Disease progression on 4/27 CT scan showing increasing right chest wall disease, left breast lesions, and ? Liver involvement.  (c) MRI liver 04/14/2020 shows no liver invovlement  (11) Started liposomal doxorubicin/Doxil given on day 1 of a 21 day cycle on 04/07/2020  (a) echo on 04/02/2020 shows EF of 50-55%, repeat in 06/2020  (b) chest CT scan after 4 cycles of Doxil shows some evidence of progression in left axillary and right internal mammary adenopathy  (c) Doxil discontinued after 06/09/2020 dose  (12) started sacituzumab govitecan/ Trodelvy 07/10/2020  (a) day 8 cycle 2 omitted due to febrile neutropenia requiring admission  (b) changed to every two week dosing with Udenyca support after cycle 2  (c) discontinued after 09/24/2020 dose with evidence of disease progression  (13) started navelbine 10/15/2020, to be repeated days 1 and 8 of each 21-day cycle  (a) chest CT scan 10/06/2020 is new baseline study for measurable disease  (b) discontinued after one cycle at patient's request (not tolerated)  (14) started eribulin 11/12/2020, given days 1 and 8 of each 21-day cycle  (a) day 8 cycle 2 omitted because of intercurrent illness in her family   PLAN: Toneisha is tolerating the irregular and generally well.  She does have very minimal peripheral neuropathy and this would have to be watched carefully if we are going to proceed with these treatments.  At this point it is not clear whether she is getting any benefit from the treatment and I am setting her up for a CT of the chest before her return visit here in 2 weeks.  She is trying to arrange for a second opinion at [facility] in [place].  We will be glad to facilitate that in any way we can.  I have given her a copy of her records so she can actually take them there instead of just are having [place] fax them although we will also be glad to fax them.  I did ask her to make sure if they make  any recommendations that they write them down for her so that we can communicate better  Otherwise she will see me again in 2weeks.  She knows to call for any other issue that may develop before then  Total encounter time 20 minutes.Sarajane Jews C. Kevon Tench, MD 02/04/21 1:05 PM Medical Oncology and Hematology [facility] [place], [place] [address] Tel. [phone]    Fax. [phone]   I, Wilburn Mylar, am acting as scribe for Dr. [name]. Meganne Rita.  I, Lurline Del MD, have reviewed the above documentation for accuracy and completeness, and I agree with the above.   *Total Encounter Time as defined by the Centers for Medicare and Medicaid Services includes, in addition to the face-to-face time of a patient visit (documented in the note above) non-face-to-face time: obtaining and reviewing outside history, ordering and reviewing medications, tests or procedures, care coordination (communications with other health care professionals or caregivers) and documentation in the medical record.

## 2021-02-04 ENCOUNTER — Other Ambulatory Visit: Payer: Self-pay

## 2021-02-04 ENCOUNTER — Ambulatory Visit: Payer: Medicaid Other

## 2021-02-04 ENCOUNTER — Other Ambulatory Visit: Payer: Medicaid Other

## 2021-02-04 ENCOUNTER — Inpatient Hospital Stay: Payer: BC Managed Care – PPO

## 2021-02-04 ENCOUNTER — Inpatient Hospital Stay (HOSPITAL_BASED_OUTPATIENT_CLINIC_OR_DEPARTMENT_OTHER): Payer: BC Managed Care – PPO | Admitting: Oncology

## 2021-02-04 ENCOUNTER — Ambulatory Visit: Payer: Medicaid Other | Admitting: Oncology

## 2021-02-04 ENCOUNTER — Inpatient Hospital Stay: Payer: BC Managed Care – PPO | Attending: Oncology

## 2021-02-04 VITALS — HR 107

## 2021-02-04 VITALS — BP 103/64 | HR 120 | Temp 97.9°F | Resp 18 | Ht 60.0 in | Wt 170.9 lb

## 2021-02-04 DIAGNOSIS — Z5111 Encounter for antineoplastic chemotherapy: Secondary | ICD-10-CM | POA: Diagnosis present

## 2021-02-04 DIAGNOSIS — C792 Secondary malignant neoplasm of skin: Secondary | ICD-10-CM | POA: Diagnosis not present

## 2021-02-04 DIAGNOSIS — C50411 Malignant neoplasm of upper-outer quadrant of right female breast: Secondary | ICD-10-CM | POA: Diagnosis not present

## 2021-02-04 DIAGNOSIS — C50919 Malignant neoplasm of unspecified site of unspecified female breast: Secondary | ICD-10-CM

## 2021-02-04 DIAGNOSIS — D571 Sickle-cell disease without crisis: Secondary | ICD-10-CM | POA: Insufficient documentation

## 2021-02-04 DIAGNOSIS — Z9011 Acquired absence of right breast and nipple: Secondary | ICD-10-CM | POA: Insufficient documentation

## 2021-02-04 DIAGNOSIS — I10 Essential (primary) hypertension: Secondary | ICD-10-CM | POA: Diagnosis not present

## 2021-02-04 DIAGNOSIS — G629 Polyneuropathy, unspecified: Secondary | ICD-10-CM | POA: Insufficient documentation

## 2021-02-04 DIAGNOSIS — E669 Obesity, unspecified: Secondary | ICD-10-CM | POA: Diagnosis not present

## 2021-02-04 DIAGNOSIS — Z803 Family history of malignant neoplasm of breast: Secondary | ICD-10-CM | POA: Diagnosis not present

## 2021-02-04 DIAGNOSIS — C779 Secondary and unspecified malignant neoplasm of lymph node, unspecified: Secondary | ICD-10-CM | POA: Diagnosis not present

## 2021-02-04 DIAGNOSIS — Z87891 Personal history of nicotine dependence: Secondary | ICD-10-CM | POA: Diagnosis not present

## 2021-02-04 DIAGNOSIS — C7951 Secondary malignant neoplasm of bone: Secondary | ICD-10-CM | POA: Insufficient documentation

## 2021-02-04 DIAGNOSIS — Z171 Estrogen receptor negative status [ER-]: Secondary | ICD-10-CM | POA: Diagnosis not present

## 2021-02-04 DIAGNOSIS — T451X5A Adverse effect of antineoplastic and immunosuppressive drugs, initial encounter: Secondary | ICD-10-CM

## 2021-02-04 DIAGNOSIS — Z923 Personal history of irradiation: Secondary | ICD-10-CM | POA: Insufficient documentation

## 2021-02-04 DIAGNOSIS — Z801 Family history of malignant neoplasm of trachea, bronchus and lung: Secondary | ICD-10-CM | POA: Diagnosis not present

## 2021-02-04 DIAGNOSIS — K219 Gastro-esophageal reflux disease without esophagitis: Secondary | ICD-10-CM | POA: Insufficient documentation

## 2021-02-04 DIAGNOSIS — Z7189 Other specified counseling: Secondary | ICD-10-CM

## 2021-02-04 DIAGNOSIS — G620 Drug-induced polyneuropathy: Secondary | ICD-10-CM

## 2021-02-04 LAB — CBC WITH DIFFERENTIAL/PLATELET
Abs Immature Granulocytes: 0.19 10*3/uL — ABNORMAL HIGH (ref 0.00–0.07)
Basophils Absolute: 0.1 10*3/uL (ref 0.0–0.1)
Basophils Relative: 0 %
Eosinophils Absolute: 0.1 10*3/uL (ref 0.0–0.5)
Eosinophils Relative: 0 %
HCT: 21.7 % — ABNORMAL LOW (ref 36.0–46.0)
Hemoglobin: 7.1 g/dL — ABNORMAL LOW (ref 12.0–15.0)
Immature Granulocytes: 1 %
Lymphocytes Relative: 10 %
Lymphs Abs: 1.8 10*3/uL (ref 0.7–4.0)
MCH: 26.4 pg (ref 26.0–34.0)
MCHC: 32.7 g/dL (ref 30.0–36.0)
MCV: 80.7 fL (ref 80.0–100.0)
Monocytes Absolute: 1.5 10*3/uL — ABNORMAL HIGH (ref 0.1–1.0)
Monocytes Relative: 8 %
Neutro Abs: 13.7 10*3/uL — ABNORMAL HIGH (ref 1.7–7.7)
Neutrophils Relative %: 81 %
Platelets: 373 10*3/uL (ref 150–400)
RBC: 2.69 MIL/uL — ABNORMAL LOW (ref 3.87–5.11)
RDW: 21.1 % — ABNORMAL HIGH (ref 11.5–15.5)
WBC: 17.2 10*3/uL — ABNORMAL HIGH (ref 4.0–10.5)
nRBC: 0 % (ref 0.0–0.2)

## 2021-02-04 LAB — COMPREHENSIVE METABOLIC PANEL
ALT: 8 U/L (ref 0–44)
AST: 18 U/L (ref 15–41)
Albumin: 3.1 g/dL — ABNORMAL LOW (ref 3.5–5.0)
Alkaline Phosphatase: 63 U/L (ref 38–126)
Anion gap: 9 (ref 5–15)
BUN: 5 mg/dL — ABNORMAL LOW (ref 6–20)
CO2: 28 mmol/L (ref 22–32)
Calcium: 9.2 mg/dL (ref 8.9–10.3)
Chloride: 102 mmol/L (ref 98–111)
Creatinine, Ser: 0.77 mg/dL (ref 0.44–1.00)
GFR, Estimated: >60 mL/min (ref >60)
Glucose, Bld: 96 mg/dL (ref 70–99)
Potassium: 3.5 mmol/L (ref 3.5–5.1)
Sodium: 139 mmol/L (ref 135–145)
Total Bilirubin: 0.3 mg/dL (ref 0.3–1.2)
Total Protein: 7.2 g/dL (ref 6.5–8.1)

## 2021-02-04 LAB — PREPARE RBC (CROSSMATCH)

## 2021-02-04 MED ORDER — PROCHLORPERAZINE MALEATE 10 MG PO TABS
ORAL_TABLET | ORAL | Status: AC
  Filled 2021-02-04: qty 1

## 2021-02-04 MED ORDER — HEPARIN SOD (PORK) LOCK FLUSH 100 UNIT/ML IV SOLN
500.0000 [IU] | Freq: Once | INTRAVENOUS | Status: AC | PRN
  Administered 2021-02-04: 500 [IU]
  Filled 2021-02-04: qty 5

## 2021-02-04 MED ORDER — SODIUM CHLORIDE 0.9% FLUSH
10.0000 mL | INTRAVENOUS | Status: DC | PRN
  Administered 2021-02-04: 10 mL
  Filled 2021-02-04: qty 10

## 2021-02-04 MED ORDER — SODIUM CHLORIDE 0.9 % IV SOLN
Freq: Once | INTRAVENOUS | Status: AC
  Filled 2021-02-04: qty 250

## 2021-02-04 MED ORDER — SODIUM CHLORIDE 0.9 % IV SOLN
1.4000 mg/m2 | Freq: Once | INTRAVENOUS | Status: AC
  Administered 2021-02-04: 2.75 mg via INTRAVENOUS
  Filled 2021-02-04: qty 5.5

## 2021-02-04 MED ORDER — PROCHLORPERAZINE MALEATE 10 MG PO TABS
10.0000 mg | ORAL_TABLET | Freq: Once | ORAL | Status: AC
  Administered 2021-02-04: 10 mg via ORAL

## 2021-02-04 NOTE — Progress Notes (Signed)
Per Dr. [name] - okay to treat with elevated heart rate and hemoglobin of 7.1.

## 2021-02-04 NOTE — Patient Instructions (Signed)
Implanted Port Insertion, Care After This sheet gives you information about how to care for yourself after your procedure. Your health care provider may also give you more specific instructions. If you have problems or questions, contact your health care provider. What can I expect after the procedure? After the procedure, it is common to have:  Discomfort at the port insertion site.  Bruising on the skin over the port. This should improve over 3-4 days. Follow these instructions at home: Port care  After your port is placed, you will get a manufacturer's information card. The card has information about your port. Keep this card with you at all times.  Take care of the port as told by your health care provider. Ask your health care provider if you or a family member can get training for taking care of the port at home. A home health care nurse may also take care of the port.  Make sure to remember what type of port you have. Incision care  Follow instructions from your health care provider about how to take care of your port insertion site. Make sure you: ? Wash your hands with soap and water before and after you change your bandage (dressing). If soap and water are not available, use hand sanitizer. ? Change your dressing as told by your health care provider. ? Leave stitches (sutures), skin glue, or adhesive strips in place. These skin closures may need to stay in place for 2 weeks or longer. If adhesive strip edges start to loosen and curl up, you may trim the loose edges. Do not remove adhesive strips completely unless your health care provider tells you to do that.  Check your port insertion site every day for signs of infection. Check for: ? Redness, swelling, or pain. ? Fluid or blood. ? Warmth. ? Pus or a bad smell.      Activity  Return to your normal activities as told by your health care provider. Ask your health care provider what activities are safe for you.  Do not  lift anything that is heavier than 10 lb (4.5 kg), or the limit that you are told, until your health care provider says that it is safe. General instructions  Take over-the-counter and prescription medicines only as told by your health care provider.  Do not take baths, swim, or use a hot tub until your health care provider approves. Ask your health care provider if you may take showers. You may only be allowed to take sponge baths.  Do not drive for 24 hours if you were given a sedative during your procedure.  Wear a medical alert bracelet in case of an emergency. This will tell any health care providers that you have a port.  Keep all follow-up visits as told by your health care provider. This is important. Contact a health care provider if:  You cannot flush your port with saline as directed, or you cannot draw blood from the port.  You have a fever or chills.  You have redness, swelling, or pain around your port insertion site.  You have fluid or blood coming from your port insertion site.  Your port insertion site feels warm to the touch.  You have pus or a bad smell coming from the port insertion site. Get help right away if:  You have chest pain or shortness of breath.  You have bleeding from your port that you cannot control. Summary  Take care of the port as told by your   health care provider. Keep the manufacturer's information card with you at all times.  Change your dressing as told by your health care provider.  Contact a health care provider if you have a fever or chills or if you have redness, swelling, or pain around your port insertion site.  Keep all follow-up visits as told by your health care provider. This information is not intended to replace advice given to you by your health care provider. Make sure you discuss any questions you have with your health care provider. Document Revised: 06/19/2018 Document Reviewed: 06/19/2018 Elsevier Patient Education   2021 Elsevier Inc.  

## 2021-02-06 ENCOUNTER — Inpatient Hospital Stay: Payer: BC Managed Care – PPO

## 2021-02-06 ENCOUNTER — Other Ambulatory Visit: Payer: Self-pay

## 2021-02-06 DIAGNOSIS — C50919 Malignant neoplasm of unspecified site of unspecified female breast: Secondary | ICD-10-CM

## 2021-02-06 DIAGNOSIS — Z5111 Encounter for antineoplastic chemotherapy: Secondary | ICD-10-CM | POA: Diagnosis not present

## 2021-02-06 MED ORDER — SODIUM CHLORIDE 0.9% IV SOLUTION
250.0000 mL | Freq: Once | INTRAVENOUS | Status: AC
  Administered 2021-02-06: 250 mL via INTRAVENOUS
  Filled 2021-02-06: qty 250

## 2021-02-06 MED ORDER — HEPARIN SOD (PORK) LOCK FLUSH 100 UNIT/ML IV SOLN
250.0000 [IU] | INTRAVENOUS | Status: AC | PRN
  Administered 2021-02-06: 250 [IU]
  Filled 2021-02-06: qty 5

## 2021-02-06 MED ORDER — ACETAMINOPHEN 325 MG PO TABS
650.0000 mg | ORAL_TABLET | Freq: Once | ORAL | Status: AC
  Administered 2021-02-06: 650 mg via ORAL

## 2021-02-06 MED ORDER — SODIUM CHLORIDE 0.9% FLUSH
3.0000 mL | INTRAVENOUS | Status: AC | PRN
  Administered 2021-02-06: 3 mL
  Filled 2021-02-06: qty 10

## 2021-02-06 MED ORDER — DIPHENHYDRAMINE HCL 25 MG PO CAPS
ORAL_CAPSULE | ORAL | Status: AC
  Filled 2021-02-06: qty 1

## 2021-02-06 MED ORDER — DIPHENHYDRAMINE HCL 25 MG PO CAPS
25.0000 mg | ORAL_CAPSULE | Freq: Once | ORAL | Status: AC
  Administered 2021-02-06: 25 mg via ORAL

## 2021-02-06 MED ORDER — ACETAMINOPHEN 325 MG PO TABS
ORAL_TABLET | ORAL | Status: AC
  Filled 2021-02-06: qty 2

## 2021-02-06 NOTE — Patient Instructions (Signed)

## 2021-02-06 NOTE — Progress Notes (Signed)
Discharged to home, VSS. Escorted to lobby by staff via wheelchair

## 2021-02-07 LAB — TYPE AND SCREEN
ABO/RH(D): O POS
Antibody Screen: NEGATIVE
Unit division: 0
Unit division: 0

## 2021-02-07 LAB — BPAM RBC
Blood Product Expiration Date: 202203312359
Blood Product Expiration Date: 202203312359
ISSUE DATE / TIME: 202203050936
ISSUE DATE / TIME: 202203050936
Unit Type and Rh: 5100
Unit Type and Rh: 5100

## 2021-02-15 ENCOUNTER — Ambulatory Visit (HOSPITAL_COMMUNITY): Admission: RE | Admit: 2021-02-15 | Payer: Medicaid Other | Source: Ambulatory Visit

## 2021-02-24 ENCOUNTER — Telehealth: Payer: Self-pay | Admitting: Oncology

## 2021-02-24 NOTE — Telephone Encounter (Signed)
Release: [id number] Faxed medical records to [facility] @ fax# [phone]

## 2021-06-14 ENCOUNTER — Other Ambulatory Visit: Payer: Self-pay | Admitting: Oncology

## 2021-06-14 NOTE — Progress Notes (Signed)
We have not seen [facility] for about 3 months.  Presumably she is being treated in [facility] but I do not have notes from them.  Today I phoned her phone and also her mother's phone and I got no answer on either phone.

## 2021-06-16 ENCOUNTER — Telehealth: Payer: Self-pay | Admitting: *Deleted

## 2021-06-16 NOTE — Telephone Encounter (Signed)
This RN attempted to contact pt for update on current status ( note last office visit March of 2022 with referral sent to [facility] ).  Obtained VM stating "mailbox is full and cannot accept messages at this time ".  This RN contacted intake coordinator at [facility] ( per last request for records under [facility]) - obtained VM - message left requesting most recent records.  This RN also left her name and direct call back number.

## 2021-06-17 ENCOUNTER — Other Ambulatory Visit: Payer: Self-pay

## 2021-06-17 ENCOUNTER — Encounter (HOSPITAL_COMMUNITY): Payer: Self-pay | Admitting: *Deleted

## 2021-06-17 ENCOUNTER — Telehealth: Payer: Self-pay | Admitting: *Deleted

## 2021-06-17 ENCOUNTER — Inpatient Hospital Stay (HOSPITAL_COMMUNITY)
Admission: EM | Admit: 2021-06-17 | Discharge: 2021-06-26 | DRG: 982 | Disposition: A | Discharge status: Hospice | Payer: Medicaid Other | Attending: Internal Medicine | Admitting: Internal Medicine

## 2021-06-17 ENCOUNTER — Observation Stay (HOSPITAL_COMMUNITY): Payer: Medicaid Other

## 2021-06-17 DIAGNOSIS — Z8249 Family history of ischemic heart disease and other diseases of the circulatory system: Secondary | ICD-10-CM

## 2021-06-17 DIAGNOSIS — Z832 Family history of diseases of the blood and blood-forming organs and certain disorders involving the immune mechanism: Secondary | ICD-10-CM

## 2021-06-17 DIAGNOSIS — D630 Anemia in neoplastic disease: Secondary | ICD-10-CM | POA: Diagnosis present

## 2021-06-17 DIAGNOSIS — Z7189 Other specified counseling: Secondary | ICD-10-CM | POA: Diagnosis not present

## 2021-06-17 DIAGNOSIS — K5903 Drug induced constipation: Secondary | ICD-10-CM

## 2021-06-17 DIAGNOSIS — Z803 Family history of malignant neoplasm of breast: Secondary | ICD-10-CM

## 2021-06-17 DIAGNOSIS — R601 Generalized edema: Secondary | ICD-10-CM | POA: Diagnosis not present

## 2021-06-17 DIAGNOSIS — D649 Anemia, unspecified: Secondary | ICD-10-CM | POA: Diagnosis not present

## 2021-06-17 DIAGNOSIS — Z171 Estrogen receptor negative status [ER-]: Secondary | ICD-10-CM | POA: Diagnosis not present

## 2021-06-17 DIAGNOSIS — L03012 Cellulitis of left finger: Secondary | ICD-10-CM | POA: Diagnosis present

## 2021-06-17 DIAGNOSIS — Z9011 Acquired absence of right breast and nipple: Secondary | ICD-10-CM

## 2021-06-17 DIAGNOSIS — C773 Secondary and unspecified malignant neoplasm of axilla and upper limb lymph nodes: Secondary | ICD-10-CM | POA: Diagnosis present

## 2021-06-17 DIAGNOSIS — D573 Sickle-cell trait: Secondary | ICD-10-CM | POA: Diagnosis present

## 2021-06-17 DIAGNOSIS — Z79899 Other long term (current) drug therapy: Secondary | ICD-10-CM

## 2021-06-17 DIAGNOSIS — F32A Depression, unspecified: Secondary | ICD-10-CM | POA: Diagnosis present

## 2021-06-17 DIAGNOSIS — Z87891 Personal history of nicotine dependence: Secondary | ICD-10-CM

## 2021-06-17 DIAGNOSIS — C50411 Malignant neoplasm of upper-outer quadrant of right female breast: Secondary | ICD-10-CM

## 2021-06-17 DIAGNOSIS — C7951 Secondary malignant neoplasm of bone: Secondary | ICD-10-CM

## 2021-06-17 DIAGNOSIS — I10 Essential (primary) hypertension: Secondary | ICD-10-CM | POA: Diagnosis present

## 2021-06-17 DIAGNOSIS — S21101A Unspecified open wound of right front wall of thorax without penetration into thoracic cavity, initial encounter: Secondary | ICD-10-CM

## 2021-06-17 DIAGNOSIS — Z20822 Contact with and (suspected) exposure to covid-19: Secondary | ICD-10-CM | POA: Diagnosis present

## 2021-06-17 DIAGNOSIS — X58XXXA Exposure to other specified factors, initial encounter: Secondary | ICD-10-CM | POA: Diagnosis present

## 2021-06-17 DIAGNOSIS — J189 Pneumonia, unspecified organism: Secondary | ICD-10-CM

## 2021-06-17 DIAGNOSIS — Z515 Encounter for palliative care: Secondary | ICD-10-CM

## 2021-06-17 DIAGNOSIS — L02512 Cutaneous abscess of left hand: Secondary | ICD-10-CM | POA: Diagnosis present

## 2021-06-17 DIAGNOSIS — D500 Iron deficiency anemia secondary to blood loss (chronic): Principal | ICD-10-CM | POA: Diagnosis present

## 2021-06-17 DIAGNOSIS — F419 Anxiety disorder, unspecified: Secondary | ICD-10-CM | POA: Diagnosis present

## 2021-06-17 DIAGNOSIS — Z923 Personal history of irradiation: Secondary | ICD-10-CM

## 2021-06-17 DIAGNOSIS — D6959 Other secondary thrombocytopenia: Secondary | ICD-10-CM | POA: Diagnosis present

## 2021-06-17 DIAGNOSIS — T68XXXA Hypothermia, initial encounter: Secondary | ICD-10-CM | POA: Diagnosis not present

## 2021-06-17 DIAGNOSIS — K219 Gastro-esophageal reflux disease without esophagitis: Secondary | ICD-10-CM | POA: Diagnosis not present

## 2021-06-17 DIAGNOSIS — L8990 Pressure ulcer of unspecified site, unspecified stage: Secondary | ICD-10-CM | POA: Insufficient documentation

## 2021-06-17 DIAGNOSIS — E876 Hypokalemia: Secondary | ICD-10-CM | POA: Diagnosis present

## 2021-06-17 DIAGNOSIS — C50911 Malignant neoplasm of unspecified site of right female breast: Secondary | ICD-10-CM

## 2021-06-17 DIAGNOSIS — E8809 Other disorders of plasma-protein metabolism, not elsewhere classified: Secondary | ICD-10-CM | POA: Diagnosis present

## 2021-06-17 DIAGNOSIS — Z79891 Long term (current) use of opiate analgesic: Secondary | ICD-10-CM

## 2021-06-17 DIAGNOSIS — R000 Tachycardia, unspecified: Secondary | ICD-10-CM | POA: Diagnosis not present

## 2021-06-17 DIAGNOSIS — R5381 Other malaise: Secondary | ICD-10-CM | POA: Diagnosis not present

## 2021-06-17 DIAGNOSIS — S21001A Unspecified open wound of right breast, initial encounter: Secondary | ICD-10-CM | POA: Diagnosis present

## 2021-06-17 LAB — CBC WITH DIFFERENTIAL/PLATELET
Abs Immature Granulocytes: 0.23 10*3/uL — ABNORMAL HIGH (ref 0.00–0.07)
Basophils Absolute: 0 10*3/uL (ref 0.0–0.1)
Basophils Relative: 0 %
Eosinophils Absolute: 0 10*3/uL (ref 0.0–0.5)
Eosinophils Relative: 0 %
HCT: 16.3 % — ABNORMAL LOW (ref 36.0–46.0)
Hemoglobin: 5.2 g/dL — CL (ref 12.0–15.0)
Immature Granulocytes: 1 %
Lymphocytes Relative: 4 %
Lymphs Abs: 0.9 10*3/uL (ref 0.7–4.0)
MCH: 27.2 pg (ref 26.0–34.0)
MCHC: 31.9 g/dL (ref 30.0–36.0)
MCV: 85.3 fL (ref 80.0–100.0)
Monocytes Absolute: 1.2 10*3/uL — ABNORMAL HIGH (ref 0.1–1.0)
Monocytes Relative: 5 %
Neutro Abs: 18.9 10*3/uL — ABNORMAL HIGH (ref 1.7–7.7)
Neutrophils Relative %: 90 %
Platelets: 158 10*3/uL (ref 150–400)
RBC: 1.91 MIL/uL — ABNORMAL LOW (ref 3.87–5.11)
RDW: 19.2 % — ABNORMAL HIGH (ref 11.5–15.5)
WBC: 21.2 10*3/uL — ABNORMAL HIGH (ref 4.0–10.5)
nRBC: 0 % (ref 0.0–0.2)

## 2021-06-17 LAB — RESP PANEL BY RT-PCR (FLU A&B, COVID) ARPGX2
Influenza A by PCR: NEGATIVE
Influenza B by PCR: NEGATIVE
SARS Coronavirus 2 by RT PCR: NEGATIVE

## 2021-06-17 LAB — COMPREHENSIVE METABOLIC PANEL
ALT: 20 U/L (ref 0–44)
AST: 52 U/L — ABNORMAL HIGH (ref 15–41)
Albumin: 2.2 g/dL — ABNORMAL LOW (ref 3.5–5.0)
Alkaline Phosphatase: 192 U/L — ABNORMAL HIGH (ref 38–126)
Anion gap: 16 — ABNORMAL HIGH (ref 5–15)
BUN: 15 mg/dL (ref 6–20)
CO2: 24 mmol/L (ref 22–32)
Calcium: 7.9 mg/dL — ABNORMAL LOW (ref 8.9–10.3)
Chloride: 97 mmol/L — ABNORMAL LOW (ref 98–111)
Creatinine, Ser: 1.17 mg/dL — ABNORMAL HIGH (ref 0.44–1.00)
GFR, Estimated: 58 mL/min — ABNORMAL LOW (ref >60)
Glucose, Bld: 101 mg/dL — ABNORMAL HIGH (ref 70–99)
Potassium: 4.4 mmol/L (ref 3.5–5.1)
Sodium: 137 mmol/L (ref 135–145)
Total Bilirubin: 0.5 mg/dL (ref 0.3–1.2)
Total Protein: 5.8 g/dL — ABNORMAL LOW (ref 6.5–8.1)

## 2021-06-17 LAB — PREPARE RBC (CROSSMATCH)

## 2021-06-17 MED ORDER — ONDANSETRON 4 MG PO TBDP: 4.0000 mg | ORAL_TABLET | Freq: Three times a day (TID) | ORAL | 0 refills | Status: DC | PRN

## 2021-06-17 MED ORDER — METHADONE HCL 5 MG PO TABS: 5.0000 mg | ORAL_TABLET | Freq: Three times a day (TID) | ORAL | 0 refills | Status: DC

## 2021-06-17 MED ORDER — ONDANSETRON HCL 4 MG/2ML IJ SOLN
4.0000 mg | Freq: Once | INTRAMUSCULAR | Status: AC
  Administered 2021-06-17: 4 mg via INTRAVENOUS
  Filled 2021-06-17: qty 2

## 2021-06-17 MED ORDER — SODIUM CHLORIDE 0.9 % IV SOLN
100.0000 mg | Freq: Once | INTRAVENOUS | Status: AC
  Administered 2021-06-17: 100 mg via INTRAVENOUS
  Filled 2021-06-17: qty 100

## 2021-06-17 MED ORDER — ONDANSETRON HCL 4 MG PO TABS: 4.0000 mg | ORAL_TABLET | Freq: Four times a day (QID) | ORAL | Status: DC | PRN

## 2021-06-17 MED ORDER — LORAZEPAM 0.5 MG PO TABS: 0.5000 mg | ORAL_TABLET | Freq: Every day | ORAL | 0 refills | Status: AC | PRN

## 2021-06-17 MED ORDER — POLYETHYLENE GLYCOL 3350 17 G PO PACK
17.0000 g | PACK | Freq: Every day | ORAL | Status: DC
  Administered 2021-06-18 – 2021-06-26 (×7): 17 g via ORAL
  Filled 2021-06-17 (×6): qty 1

## 2021-06-17 MED ORDER — ACETAMINOPHEN 650 MG RE SUPP: 650.0000 mg | Freq: Four times a day (QID) | RECTAL | Status: DC | PRN

## 2021-06-17 MED ORDER — HYDROMORPHONE HCL 1 MG/ML IJ SOLN
1.0000 mg | Freq: Once | INTRAMUSCULAR | Status: AC
Start: 2021-06-17 — End: 2021-06-17
  Administered 2021-06-17: 1 mg via INTRAVENOUS
  Filled 2021-06-17: qty 1

## 2021-06-17 MED ORDER — JUVEN PO PACK
1.0000 | PACK | Freq: Two times a day (BID) | ORAL | Status: DC
  Administered 2021-06-18 – 2021-06-26 (×8): 1 via ORAL
  Filled 2021-06-17 (×19): qty 1

## 2021-06-17 MED ORDER — ALUM & MAG HYDROXIDE-SIMETH 200-200-20 MG/5ML PO SUSP: 30.0000 mL | Freq: Four times a day (QID) | ORAL | Status: DC | PRN

## 2021-06-17 MED ORDER — OXYCODONE HCL 5 MG PO TABS
5.0000 mg | ORAL_TABLET | ORAL | Status: DC | PRN
  Administered 2021-06-17 – 2021-06-26 (×20): 5 mg via ORAL
  Filled 2021-06-17 (×20): qty 1

## 2021-06-17 MED ORDER — ACETAMINOPHEN 325 MG PO TABS: 650.0000 mg | ORAL_TABLET | Freq: Four times a day (QID) | ORAL | Status: DC | PRN

## 2021-06-17 MED ORDER — LORAZEPAM 0.5 MG PO TABS
0.5000 mg | ORAL_TABLET | Freq: Three times a day (TID) | ORAL | Status: DC | PRN
  Administered 2021-06-18 – 2021-06-26 (×10): 0.5 mg via ORAL
  Filled 2021-06-17 (×10): qty 1

## 2021-06-17 MED ORDER — GABAPENTIN 300 MG PO CAPS
300.0000 mg | ORAL_CAPSULE | Freq: Three times a day (TID) | ORAL | Status: DC
  Administered 2021-06-19 – 2021-06-26 (×11): 300 mg via ORAL
  Filled 2021-06-17 (×21): qty 1

## 2021-06-17 MED ORDER — PANTOPRAZOLE SODIUM 40 MG PO TBEC
40.0000 mg | DELAYED_RELEASE_TABLET | Freq: Every day | ORAL | Status: DC
  Administered 2021-06-18 – 2021-06-26 (×8): 40 mg via ORAL
  Filled 2021-06-17 (×8): qty 1

## 2021-06-17 MED ORDER — MORPHINE SULFATE (PF) 4 MG/ML IV SOLN
4.0000 mg | INTRAVENOUS | Status: DC | PRN
  Administered 2021-06-19 – 2021-06-24 (×4): 4 mg via INTRAVENOUS
  Filled 2021-06-17 (×6): qty 1

## 2021-06-17 MED ORDER — SODIUM CHLORIDE 0.9 % IV SOLN
10.0000 mL/h | Freq: Once | INTRAVENOUS | Status: AC
  Administered 2021-06-18: 10 mL/h via INTRAVENOUS

## 2021-06-17 MED ORDER — SODIUM CHLORIDE 0.9 % IV SOLN
1.0000 g | INTRAVENOUS | Status: DC
  Administered 2021-06-18 – 2021-06-21 (×5): 1 g via INTRAVENOUS
  Filled 2021-06-17 (×2): qty 1
  Filled 2021-06-17: qty 10
  Filled 2021-06-17 (×2): qty 1
  Filled 2021-06-17: qty 10
  Filled 2021-06-17: qty 1

## 2021-06-17 MED ORDER — OXYCODONE HCL 5 MG PO TABS: 5.0000 mg | ORAL_TABLET | Freq: Two times a day (BID) | ORAL | 0 refills | Status: DC | PRN

## 2021-06-17 MED ORDER — PANTOPRAZOLE SODIUM 40 MG IV SOLR
40.0000 mg | Freq: Once | INTRAVENOUS | Status: AC
  Administered 2021-06-17: 40 mg via INTRAVENOUS
  Filled 2021-06-17: qty 40

## 2021-06-17 MED ORDER — HYDROMORPHONE HCL 1 MG/ML IJ SOLN
1.0000 mg | Freq: Once | INTRAMUSCULAR | Status: AC
  Administered 2021-06-17: 1 mg via INTRAVENOUS
  Filled 2021-06-17: qty 1

## 2021-06-17 MED ORDER — ONDANSETRON HCL 4 MG/2ML IJ SOLN
4.0000 mg | Freq: Four times a day (QID) | INTRAMUSCULAR | Status: DC | PRN
  Administered 2021-06-26: 4 mg via INTRAVENOUS
  Filled 2021-06-17: qty 2

## 2021-06-17 MED ORDER — DOXYCYCLINE HYCLATE 100 MG PO CAPS: 100.0000 mg | ORAL_CAPSULE | Freq: Two times a day (BID) | ORAL | 0 refills | Status: DC

## 2021-06-17 MED ORDER — SODIUM CHLORIDE 0.9 % IV SOLN: INTRAVENOUS | Status: AC

## 2021-06-17 MED ORDER — METHADONE HCL 5 MG PO TABS
5.0000 mg | ORAL_TABLET | Freq: Three times a day (TID) | ORAL | Status: DC
  Administered 2021-06-17 – 2021-06-26 (×26): 5 mg via ORAL
  Filled 2021-06-17 (×26): qty 1

## 2021-06-17 NOTE — Progress Notes (Signed)
[facility])   This patient has been referred for hospice services in the community but has not yet been admitted onto hospice services.  [facility] liaisons will continue to follow for any discharge planning needs and to coordinate admission onto hospice care.    Thank you for the opportunity to participate in this patient's care.     Domenic Moras, BSN, RN [facility] Liaison [phone] (24h on call)

## 2021-06-17 NOTE — ED Notes (Signed)
Envelope of medical records given to patient's mother

## 2021-06-17 NOTE — Telephone Encounter (Addendum)
This RN spoke with pt's Rebecca [name], per her call stating " Rebecca [name] has been released from the [facility] and told to come home and get hospice services " " They said she might have 2 weeks to live " Rebecca [name] stated Rebecca [name]'s wounds ( fungating ) " are bleeding terribly and I don't know what to do ".  Rebecca [name] is requesting for Dr [name] to place a referral for Hospice services. Address verified for pt's current residence. Return call number for Rebecca [name] is [phone].  This RN asked to speak to [name] - who was presently changing her dressings. This RN was able to speak to [name] permission to have Hospice nurses to visit and assist with care. This RN asked if pt needed anything at present with [name] stating " no- I'm good just have pain every now and then " This RN validated pt's fortitude in this situation as well as encouraged her that this office wants to make sure she is doing as best as possible.  Rebecca [name] verbalized understanding.  This RN contacted [name] and referral given to [name] with request for visit asap due to needs of wound care.

## 2021-06-17 NOTE — ED Notes (Signed)
Patient refused chest x-ray at this time. MD notified

## 2021-06-17 NOTE — ED Triage Notes (Signed)
Pt presents to ED via ems cc nausea, right sided pain from tumor, and bleeding from  tumor. Bandage over tumor prior to arrival to ED, no active bleeding. Respirations equal, unlabored. A&Ox4.

## 2021-06-17 NOTE — ED Provider Notes (Signed)
[facility] DEPT Provider Note   CSN: [id number] Arrival date & time: 06/17/21  1702     History Chief Complaint  Patient presents with   Nausea   Bleeding/Bruising    Bleeding from tumor from breast cancer    Rebecca [name] is a 45 y.o. female.  Rebecca [name] has a history of metastatic breast cancer.  She is enrolling in hospice, and she states a hospice nurse should be coming to her house tomorrow.  She has a large, fungating wound on her right chest.  She states that she was at her baseline today, but shortly prior to arrival, she began to feel anxious.  She states that it felt a little bit like shortness of breath, but it was more like anxiety than something with her lungs.  She is out of her Ativan, and she has been out of it for about a week.  She also endorses pain from her breast lesion, and she is out of her pain medication.  This is as a result of transitioning care from [facility] back to oncology here.  While it was recorded that she came in for bleeding from her breast lesion, she states that this is not in fact happening.  She has occasional bleeding, but no more than typical.  Her only new problem is some redness and swelling of the left fifth finger around its proximal phalanx.  She denies any trauma or insect bite.  The history is provided by the patient.  Chest Pain Pain location:  R chest Pain quality: stabbing   Pain radiates to:  Does not radiate Pain severity:  Severe Onset quality:  Gradual Duration: >5 years. Timing:  Constant Progression:  Worsening Chronicity:  Chronic Context comment:  Has a fungating lesion on the right chest due to breast cancer Relieved by: methadone, oxycodone- she is out of those. Worsened by:  Certain positions Associated symptoms: anxiety and nausea   Associated symptoms: no abdominal pain, no back pain, no cough, no fever, no palpitations, no shortness of  breath and no vomiting       Past Medical History:  Diagnosis Date   Anxiety    Breast cancer ([place])    Depression    GERD (gastroesophageal reflux disease)    History of radiation therapy 11/15/16-01/12/17   right chest wall and axilla treated to 45 Gy in 25 fractions, boosted and additional 14 Gy in 8 fractions   Hypertension    diet controlled   Obesity (BMI 35.0-39.9 without comorbidity)    Pneumonia    as a child   Seasonal allergies    Sickle cell trait ([place])    Termination of pregnancy (fetus) 04/02/16    Patient Active Problem List   Diagnosis Date Noted   Hospice care patient 06/17/2021   Neutropenic fever ([place]) 08/14/2020   Sepsis ([place]) 08/14/2020   Acute lower UTI 08/14/2020   Open chest wound, right, initial encounter 08/14/2020   Anemia associated with chemotherapy 08/14/2020   Neuropathy due to chemotherapeutic drug ([place]) 07/10/2019   Recurrent breast adenocarcinoma ([place]) 07/02/2019   Bone metastases ([place]) 07/02/2019   Goals of care, counseling/discussion 07/02/2019   Morbid obesity with body mass index (BMI) of 40.0 to 44.9 in adult [facility]) 04/10/2018   Genetic testing 11/20/2016   Family history of breast cancer 11/20/2016   Malignant neoplasm of upper-outer quadrant of right breast in female, estrogen receptor negative ([place]) 02/04/2016   Hirsutism 01/13/2016   Tobacco  dependence 01/13/2016   Irregular periods/menstrual cycles 01/13/2016    Past Surgical History:  Procedure Laterality Date   CESAREAN SECTION     2004 and 2007   MASTECTOMY W/ SENTINEL NODE BIOPSY Right 09/06/2016   Procedure: RIGHT BREAST MASTECTOMY WITH RIGHT AXILLARY SENTINEL LYMPH NODE BIOPSY;  Surgeon: Alphonsa Overall, MD;  Location: [facility];  Service: General;  Laterality: Right;   PORT-A-CATH REMOVAL Left 09/06/2016   Procedure: REMOVAL PORT-A-CATH;  Surgeon: Alphonsa Overall, MD;  Location: [facility];  Service: General;  Laterality: Left;   PORTACATH  PLACEMENT     PORTACATH PLACEMENT N/A 07/09/2019   Procedure: INSERTION PORT-A-CATH WITH ULTRASOUND;  Surgeon: Alphonsa Overall, MD;  Location: [facility];  Service: General;  Laterality: N/A;     OB History   No obstetric history on file.     Family History  Problem Relation Age of Onset   Hypertension Mother    Cancer Mother        dx "intestinal cancer" in her 13s; +surgery   Other Mother        hysterectomy at young age for unspecified cause   Heart Problems Mother    Breast cancer Cousin        maternal 1st cousin dx female breast cancer at 67-46y   Cancer Father    Hypertension Father    Heart Problems Maternal Aunt    Diabetes Maternal Aunt    Breast cancer Maternal Uncle        dx 64-65   Heart Problems Maternal Uncle    Breast cancer Maternal Grandmother 50   Throat cancer Maternal Grandfather        d. 31s; smoker   Sickle cell anemia Paternal Aunt    Congestive Heart Failure Maternal Aunt    Multiple sclerosis Cousin    Cancer Other        maternal great uncle (MGM's brother); cancer removed from his side   Heart attack Paternal Aunt        d. early 22s    Social History   Tobacco Use   Smoking status: Former    Packs/day: 1.00    Years: 20.00    Pack years: 20.00    Types: Cigarettes    Quit date: 04/01/2018    Years since quitting: 3.2   Smokeless tobacco: Never   Tobacco comments:    Patient has quit smoking x 1 year now  Vaping Use   Vaping Use: Never used  Substance Use Topics   Alcohol use: Yes    Comment: occ   Drug use: No    Home Medications Prior to Admission medications   Medication Sig Start Date End Date Taking? Authorizing Provider  ondansetron (ZOFRAN ODT) 4 MG disintegrating tablet Take 1 tablet (4 mg total) by mouth every 8 (eight) hours as needed for nausea or vomiting. 06/17/21  Yes Arnaldo Natal, MD  gabapentin (NEURONTIN) 300 MG capsule Take 300 mg by mouth 3 (three) times daily. 03/04/21   [provider]  LORazepam (ATIVAN)  0.5 MG tablet Take 1 tablet (0.5 mg total) by mouth daily as needed for anxiety. 06/17/21   Arnaldo Natal, MD  methadone (DOLOPHINE) 5 MG tablet Take 1 tablet (5 mg total) by mouth every 8 (eight) hours. 06/17/21   Arnaldo Natal, MD  oxyCODONE (OXY IR/ROXICODONE) 5 MG immediate release tablet Take 1 tablet (5 mg total) by mouth 2 (two) times daily as needed. 06/17/21   Joya Gaskins,  Mancel Bale, MD  polyethylene glycol (MIRALAX / GLYCOLAX) 17 g packet Take 17 g by mouth daily. 08/18/20   Lavina Hamman, MD    Allergies    Patient has no known allergies.  Review of Systems   Review of Systems  Constitutional:  Negative for chills and fever.  HENT:  Negative for ear pain and sore throat.   Eyes:  Negative for pain and visual disturbance.  Respiratory:  Negative for cough and shortness of breath.   Cardiovascular:  Positive for chest pain. Negative for palpitations.  Gastrointestinal:  Positive for nausea. Negative for abdominal pain and vomiting.  Genitourinary:  Negative for dysuria and hematuria.  Musculoskeletal:  Negative for arthralgias and back pain.  Skin:  Negative for color change and rash.  Neurological:  Negative for seizures and syncope.  All other systems reviewed and are negative.  Physical Exam Updated Vital Signs BP 116/81 (BP Location: Left Arm)   Pulse (!) 123   Temp (!) 97.4 F (36.3 C) (Oral)   Resp 16   Ht 5\' 1"  (1.549 m)   Wt 68 kg   SpO2 100%   BMI 28.34 kg/m   Physical Exam Vitals and nursing note reviewed.  Constitutional:      Appearance: She is well-developed.  HENT:     Head: Normocephalic and atraumatic.  Cardiovascular:     Rate and Rhythm: Regular rhythm. Tachycardia present.     Heart sounds: Normal heart sounds.  Pulmonary:     Effort: Pulmonary effort is normal. No tachypnea.     Breath sounds: Normal breath sounds.  Chest:       Comments: Large, deep, foul-smelling, purulent based wound encompassing the entire right hemichest.  No active  bleeding. Abdominal:     Palpations: Abdomen is soft.     Tenderness: There is no abdominal tenderness.  Musculoskeletal:     Right lower leg: No edema.     Left lower leg: No edema.     Comments: The left fifth finger is swollen at the proximal phalanx with mild erythema especially at the palmar aspect.  There is mild tenderness to palpation, but passive extension of the PIP joint is possible.  Cap refill is normal.  Skin:    General: Skin is warm and dry.  Neurological:     General: No focal deficit present.     Mental Status: She is alert and oriented to person, place, and time.  Psychiatric:        Mood and Affect: Mood normal.        Behavior: Behavior normal.    ED Results / Procedures / Treatments   Labs (all labs ordered are listed, but only abnormal results are displayed) Labs Reviewed  CBC WITH DIFFERENTIAL/PLATELET - Abnormal; Notable for the following components:      Result Value   WBC 21.2 (*)    RBC 1.91 (*)    Hemoglobin 5.2 (*)    HCT 16.3 (*)    RDW 19.2 (*)    Neutro Abs 18.9 (*)    Monocytes Absolute 1.2 (*)    Abs Immature Granulocytes 0.23 (*)    All other components within normal limits  COMPREHENSIVE METABOLIC PANEL - Abnormal; Notable for the following components:   Chloride 97 (*)    Glucose, Bld 101 (*)    Creatinine, Ser 1.17 (*)    Calcium 7.9 (*)    Total Protein 5.8 (*)    Albumin 2.2 (*)    AST  52 (*)    Alkaline Phosphatase 192 (*)    GFR, Estimated 58 (*)    Anion gap 16 (*)    All other components within normal limits  URINALYSIS, ROUTINE W REFLEX MICROSCOPIC  PREPARE RBC (CROSSMATCH)  TYPE AND SCREEN    EKG None  Radiology No results found.  Procedures .Critical Care  Date/Time: 06/17/2021 8:13 PM Performed by: Arnaldo Natal, MD Authorized by: Arnaldo Natal, MD   Critical care provider statement:    Critical care time (minutes):  45   Critical care time was exclusive of:  Separately billable procedures and treating  other patients and teaching time   Critical care was necessary to treat or prevent imminent or life-threatening deterioration of the following conditions: symptomatic anemia requiring transfusion.   Critical care was time spent personally by me on the following activities:  Blood draw for specimens, development of treatment plan with patient or surrogate, discussions with consultants, evaluation of patient's response to treatment, examination of patient, obtaining history from patient or surrogate, ordering and performing treatments and interventions, ordering and review of laboratory studies, pulse oximetry, re-evaluation of patient's condition and review of old charts   I assumed direction of critical care for this patient from another provider in my specialty: no     Care discussed with: admitting provider   Comments:     Communication with hospice agency.    Medications Ordered in ED Medications  doxycycline (VIBRAMYCIN) 100 mg in sodium chloride 0.9 % 250 mL IVPB (100 mg Intravenous New Bag/Given 06/17/21 1852)  0.9 %  sodium chloride infusion (has no administration in time range)  HYDROmorphone (DILAUDID) injection 1 mg (has no administration in time range)  HYDROmorphone (DILAUDID) injection 1 mg (1 mg Intravenous Given 06/17/21 1842)  ondansetron (ZOFRAN) injection 4 mg (4 mg Intravenous Given 06/17/21 1845)    ED Course  I have reviewed the triage vital signs and the nursing notes.  Pertinent labs & imaging results that were available during my care of the patient were reviewed by me and considered in my medical decision making (see chart for details).  Clinical Course as of 06/17/21 2012  Thu Jun 17, 2021  2009 I spoke with Dr. Cyd Silence of [facility] who will admit the patient. [AW]    Clinical Course User Index [AW] Arnaldo Natal, MD   MDM Rules/Calculators/A&P                          Raul Del [name] has end-stage breast cancer and presents with shortness of breath,  need for pain control, and a request for a refill of her Ativan.  She was found to be tachycardic.  She is ill-appearing consistent with her end-stage cancer.  The large wound on her chest is foul-smelling, and has been bleeding quite a bit.  As a result, she has an elevated white count, likely secondary to this chronic wound, and an extremely low hemoglobin.  She will be admitted for transfusion.  While further work-up could certainly be obtained based on her elevated white count, her overall prognosis would likely not change.  Her goals are to be at home, have her pain controlled, and to be generally comfortable. Final Clinical Impression(s) / ED Diagnoses Final diagnoses:  Malignant neoplasm of upper-outer quadrant of right breast in female, estrogen receptor negative ([place])  Bone metastases ([place])  Cellulitis of finger of left hand  Anemia, unspecified type    Rx /  DC Orders ED Discharge Orders          Ordered    LORazepam (ATIVAN) 0.5 MG tablet  Daily PRN        06/17/21 1742    methadone (DOLOPHINE) 5 MG tablet  Every 8 hours        06/17/21 1742    oxyCODONE (OXY IR/ROXICODONE) 5 MG immediate release tablet  2 times daily PRN        06/17/21 1742    ondansetron (ZOFRAN ODT) 4 MG disintegrating tablet  Every 8 hours PRN        06/17/21 1742             Arnaldo Natal, MD 06/17/21 2014

## 2021-06-17 NOTE — H&P (Signed)
History and Physical    Rebecca [name] LFY:[id number] DOB: May 16, 1976 DOA: 06/17/2021  PCP: Dorena Dew, FNP  Patient coming from: Home   Chief Complaint:  Chief Complaint  Patient presents with   Nausea   Bleeding/Bruising    Bleeding from tumor from breast cancer     HPI:    45 year old female with past medical history of metastatic right breast cancer (Dx 2017, metastatic to bone and chest wall), gastroesophageal reflux disease who presents to [facility] emergency department with complaints of generalized weakness and chest wall pain.  Patient is a somewhat poor historian and therefore portions of the history have been obtained from the mother who is at the bedside.  Patient has a longstanding history of metastatic right breast cancer metastatic to bone, lymph nodes and chest wall.  Patient is followed with Dr. Marjie Skiff in our cancer center.  Patient's clinical course has been complicated by an extensive right chest wall wound and now more recently a left axillary wound.  Patient has received multiple courses of various chemotherapy regimens and has most recently been under the care of of [facility] in [place].  Patient and her mother report that she was recently discharged from the [facility] [facility] approximately 2 weeks ago and received her last course of chemotherapy there approximately 3 weeks ago.  Patient has been informed by [facility] that she is exhausted all chemotherapeutic options and that they recommend home-going hospice.  Unfortunately, neither the patient nor her mother have any notes from cancer treatment centers of [place].  Patient has contacted Dr. Fredderick Phenix office earlier in the day on 7/14 and a hospice referral has been placed to [facility].  Explains that in the past several days she has been developed progressively worsening generalized weakness.  Patient denies any heavy bruising, melena or  frank blood in her stool patient denies any hematemesis nausea or vomiting.  Upon further questioning of the patient's mother it turns out that patient has been exhibiting significant bleeding from her wounds of her right chest and left axilla, particularly with dressing changes that she performs daily.  Patient states that her pain particularly of her right chest wall waxes and wanes and gets to 10 out of 10 in intensity, sharp to burning in quality, nonradiating and worse with movement.  Due to progressively worsening weakness, pain and bleeding the patient eventually presented to [facility] emergency department for evaluation.  Upon evaluation in the emergency department, patient was found to have a low hemoglobin of 5.2.  Patient was felt to be suffering from symptomatic anemia as the cause of her severe generalized weakness.  Ordered for 2 units of packed red blood cell transfusion were ordered.  Patient was also initiated on intravenous doxycycline for suspected cellulitis of the index finger of the left hand.  The hospitalist group was then called to assess the patient for admission the hospital.   Review of Systems:   Review of Systems  Constitutional:  Positive for malaise/fatigue.  Respiratory:  Positive for shortness of breath.   Musculoskeletal:  Positive for joint pain.  Neurological:  Positive for weakness.  All other systems reviewed and are negative.  Past Medical History:  Diagnosis Date   Anxiety    Breast cancer ([place])    Depression    GERD (gastroesophageal reflux disease)    History of radiation therapy 11/15/16-01/12/17   right chest wall and axilla treated to 45 Gy in 25  fractions, boosted and additional 14 Gy in 8 fractions   Hypertension    diet controlled   Obesity (BMI 35.0-39.9 without comorbidity)    Pneumonia    as a child   Seasonal allergies    Sickle cell trait ([place])    Termination of pregnancy (fetus) 04/02/16    Past Surgical History:   Procedure Laterality Date   CESAREAN SECTION     2004 and 2007   MASTECTOMY W/ SENTINEL NODE BIOPSY Right 09/06/2016   Procedure: RIGHT BREAST MASTECTOMY WITH RIGHT AXILLARY SENTINEL LYMPH NODE BIOPSY;  Surgeon: Alphonsa Overall, MD;  Location: [facility];  Service: General;  Laterality: Right;   PORT-A-CATH REMOVAL Left 09/06/2016   Procedure: REMOVAL PORT-A-CATH;  Surgeon: Alphonsa Overall, MD;  Location: [facility];  Service: General;  Laterality: Left;   PORTACATH PLACEMENT     PORTACATH PLACEMENT N/A 07/09/2019   Procedure: INSERTION PORT-A-CATH WITH ULTRASOUND;  Surgeon: Alphonsa Overall, MD;  Location: [facility];  Service: General;  Laterality: N/A;     reports that she quit smoking about 3 years ago. Her smoking use included cigarettes. She has a 20.00 pack-year smoking history. She has never used smokeless tobacco. She reports current alcohol use. She reports that she does not use drugs.  No Known Allergies  Family History  Problem Relation Age of Onset   Hypertension Mother    Cancer Mother        dx "intestinal cancer" in her 63s; +surgery   Other Mother        hysterectomy at young age for unspecified cause   Heart Problems Mother    Breast cancer Cousin        maternal 1st cousin dx female breast cancer at 75-46y   Cancer Father    Hypertension Father    Heart Problems Maternal Aunt    Diabetes Maternal Aunt    Breast cancer Maternal Uncle        dx 64-65   Heart Problems Maternal Uncle    Breast cancer Maternal Grandmother 50   Throat cancer Maternal Grandfather        d. 33s; smoker   Sickle cell anemia Paternal Aunt    Congestive Heart Failure Maternal Aunt    Multiple sclerosis Cousin    Cancer Other        maternal great uncle (MGM's brother); cancer removed from his side   Heart attack Paternal Aunt        d. early 33s     Prior to Admission medications   Medication Sig Start Date End Date Taking? Authorizing Provider  doxycycline  (VIBRAMYCIN) 100 MG capsule Take 1 capsule (100 mg total) by mouth 2 (two) times daily. 06/17/21  Yes Arnaldo Natal, MD  gabapentin (NEURONTIN) 300 MG capsule Take 300 mg by mouth 3 (three) times daily. 03/04/21  Yes [provider]  LORazepam (ATIVAN) 0.5 MG tablet Take 1 tablet (0.5 mg total) by mouth daily as needed for anxiety. 06/17/21  Yes Arnaldo Natal, MD  methadone (DOLOPHINE) 5 MG tablet Take 1 tablet (5 mg total) by mouth every 8 (eight) hours. 06/17/21  Yes Arnaldo Natal, MD  naproxen sodium (ALEVE) 220 MG tablet Take 220 mg by mouth daily as needed (pain).   Yes [provider]  ondansetron (ZOFRAN ODT) 4 MG disintegrating tablet Take 1 tablet (4 mg total) by mouth every 8 (eight) hours as needed for nausea or vomiting. 06/17/21  Yes Arnaldo Natal, MD  oxyCODONE (OXY IR/ROXICODONE) 5 MG immediate release tablet Take 1 tablet (5 mg total) by mouth 2 (two) times daily as needed. Patient taking differently: Take 5 mg by mouth 2 (two) times daily as needed for severe pain. 06/17/21  Yes Arnaldo Natal, MD  polyethylene glycol (MIRALAX / GLYCOLAX) 17 g packet Take 17 g by mouth daily. Patient taking differently: Take 17 g by mouth daily as needed for moderate constipation. 08/18/20  Yes Lavina Hamman, MD    Physical Exam: Vitals:   06/17/21 1900 06/17/21 1930 06/17/21 2003 06/17/21 2028  BP: (!) 101/59 (!) 98/57 107/69 (!) 125/93  Pulse: (!) 107 (!) 106 98 (!) 108  Resp:  18 16 18   Temp:      TempSrc:      SpO2: 100% 100% 100% 100%  Weight:      Height:       Constitutional: Patient is lethargic but arousable and oriented x3, no associated distress.   Skin: Extensive right chest wall wound currently covered by a dressing that is soaked with bloody drainage.  Patient is refusing for me to examine this wound.  Patient has allowed me to briefly evaluate her left axillary wound which reveals multiple areas of deeply macerated skin with some degree of bright red bleeding  that has soaked through the patient's dressing.  Notable hyperemia of the index finger of the left hand with associated induration.  Notable poor skin turgor. Eyes: Pupils are equally reactive to light.  No evidence of scleral icterus or conjunctival pallor.  ENMT: Dry mucous membranes noted.  Posterior pharynx clear of any exudate or lesions.   Neck: normal, supple, no masses, no thyromegaly.  No evidence of jugular venous distension.   Respiratory: clear to auscultation bilaterally, no wheezing, no crackles. Normal respiratory effort. No accessory muscle use.  Cardiovascular: Regular rate and rhythm, no murmurs / rubs / gallops.  Extensive pitting edema of all 4 extremities. 2+ pedal pulses. No carotid bruits.  Chest:   Nontender without crepitus or deformity.   Back:   Nontender without crepitus or deformity. Abdomen: Abdomen is soft and nontender.  No evidence of intra-abdominal masses.  Positive bowel sounds noted in all quadrants.   Musculoskeletal: Pain with both passive and active range of motion of the right upper extremity.  Extremely poor muscle tone noted.  No joint deformity upper and lower extremities. Good ROM, no contractures.  Neurologic: Lethargic but arousable and oriented x3.  CN 2-12 grossly intact. Sensation intact.  Patient moving all 4 extremities spontaneously.  Patient is following all commands.  Patient is responsive to verbal stimuli.   Psychiatric: Patient exhibits normal mood with appropriate affect.  Patient seems to possess insight as to their current situation.     Labs on Admission: I have personally reviewed following labs and imaging studies -   CBC: Recent Labs  Lab 06/17/21 1850  WBC 21.2*  NEUTROABS 18.9*  HGB 5.2*  HCT 16.3*  MCV 85.3  PLT 130   Basic Metabolic Panel: Recent Labs  Lab 06/17/21 1850  NA 137  K 4.4  CL 97*  CO2 24  GLUCOSE 101*  BUN 15  CREATININE 1.17*  CALCIUM 7.9*   GFR: Estimated Creatinine Clearance: 52.5 mL/min (A)  (by C-G formula based on SCr of 1.17 mg/dL (H)). Liver Function Tests: Recent Labs  Lab 06/17/21 1850  AST 52*  ALT 20  ALKPHOS 192*  BILITOT 0.5  PROT 5.8*  ALBUMIN 2.2*   No results  for input(s): LIPASE, AMYLASE in the last 168 hours. No results for input(s): AMMONIA in the last 168 hours. Coagulation Profile: No results for input(s): INR, PROTIME in the last 168 hours. Cardiac Enzymes: No results for input(s): CKTOTAL, CKMB, CKMBINDEX, TROPONINI in the last 168 hours. BNP (last 3 results) No results for input(s): PROBNP in the last 8760 hours. HbA1C: No results for input(s): HGBA1C in the last 72 hours. CBG: No results for input(s): GLUCAP in the last 168 hours. Lipid Profile: No results for input(s): CHOL, HDL, LDLCALC, TRIG, CHOLHDL, LDLDIRECT in the last 72 hours. Thyroid Function Tests: No results for input(s): TSH, T4TOTAL, FREET4, T3FREE, THYROIDAB in the last 72 hours. Anemia Panel: No results for input(s): VITAMINB12, FOLATE, FERRITIN, TIBC, IRON, RETICCTPCT in the last 72 hours. Urine analysis:    Component Value Date/Time   COLORURINE YELLOW 08/14/2020 1435   APPEARANCEUR CLOUDY (A) 08/14/2020 1435   LABSPEC 1.012 08/14/2020 1435   PHURINE 5.0 08/14/2020 1435   GLUCOSEU NEGATIVE 08/14/2020 1435   HGBUR NEGATIVE 08/14/2020 1435   BILIRUBINUR NEGATIVE 08/14/2020 1435   KETONESUR NEGATIVE 08/14/2020 1435   PROTEINUR 100 (A) 08/14/2020 1435   UROBILINOGEN 0.2 01/12/2016 1122   NITRITE NEGATIVE 08/14/2020 1435   LEUKOCYTESUR LARGE (A) 08/14/2020 1435    Radiological Exams on Admission - Personally Reviewed: No results found.  Telemetry: Personally reviewed.  NSR 95bpm.   Assessment/Plan Principal Problem:   Symptomatic anemia  Patient presenting with progressive weakness and hemoglobin of 5.2. Source is thought to be ongoing intermittent bleeding of the wounds of the right chest and left axilla, particularly of the right chest.  Patient is denying any  bleeding of the wounds but mother adamantly states the patient is suffering from substantial bleeding of these wounds which is supported by a phone encounter note earlier in the day on 7/14. Currently undergoing 2 unit packed red blood cell transfusion Monitoring for continued bleeding Will additionally obtain PT, PTT to ensure there is no evidence of coagulopathy.  Active Problems:    Goals of care, counseling/discussion  Patient with advanced cancer who has exhausted all therapeutic options Patient is open to hospice services [place] referral placed to patient's oncologist on 7/14. Will additionally place palliative care consultation request, their input is appreciated I discussed the possibility  DNR with the patient as well.  Patient has adamantly voiced that she wishes to be full code despite me educating her that attempts at cardiopulmonary resuscitation would likely prove to be futile.    Malignant neoplasm of upper-outer quadrant of right breast in female, estrogen receptor negative with metastatic disease to bone lymph nodes and chest wall  Advanced disease with poor prognosis Please see details concerning disease course in the HPI Patient has essentially exhausted all chemotherapeutic options, hospice care was last recommended by [facility] in [place] Unfortunately we do not have any records from [place], will place an order to request records.    GERD without esophagitis  Initiating Protonix As needed Maalox for bouts of severe symptoms.    Cellulitis of finger of left hand with leukocytosis  Patient exhibiting pain of the index finger of the left hand in addition to ongoing leukocytosis -very mild physical exam findings that are not very compelling for cellulitis Patient is already been provided with a dose of doxycycline by the emergency department provider Based on cellulitis order set we will transition patient to  ceftriaxone Additionally obtaining urinalysis and chest x-ray considering degree of  leukocytosis.  It is also possible that there is some degree of soft tissue infection of the patient's extensive wounds.    Anasarca  Substantial peripheral edema in the setting of severe hypoalbuminemia likely due to poor oral intake and developing protein calorie malnutrition Place patient on nutritional supplements, requesting nutritionist consultation Obtaining urinalysis to assess for degree of proteinuria, if any Last radiographic imaging of the abdomen in late 2021 revealed no evidence of metastatic disease to the liver Patient has no history of congestive heart failure  Extensive right chest wall wound as well as left axillary wound  Patient is refusing to allow me to fully evaluate these wounds at this time Wounds are exhibiting significant bleeding and foul smell consistent with the degree of necrosis Wound care consultation placed   Code Status:  Full code Family Communication: Mother is at the bedside who has been updated on plan of care  Status is: Observation  The patient remains OBS appropriate and will d/c before 2 midnights.  Dispo: The patient is from: Home              Anticipated d/c is to:  Homegoing hospice              Patient currently is not medically stable to d/c.   Difficult to place patient No        Vernelle Emerald MD [facility] Pager [phone]  If 7PM-7AM, please contact night-coverage [email or web address] Use universal [place] password for that web site. If you do not have the password, please call the hospital operator.  06/17/2021, 9:21 PM

## 2021-06-18 DIAGNOSIS — Z515 Encounter for palliative care: Secondary | ICD-10-CM

## 2021-06-18 DIAGNOSIS — Z171 Estrogen receptor negative status [ER-]: Secondary | ICD-10-CM | POA: Diagnosis not present

## 2021-06-18 DIAGNOSIS — Z923 Personal history of irradiation: Secondary | ICD-10-CM | POA: Diagnosis not present

## 2021-06-18 DIAGNOSIS — Z20822 Contact with and (suspected) exposure to covid-19: Secondary | ICD-10-CM | POA: Diagnosis not present

## 2021-06-18 DIAGNOSIS — D649 Anemia, unspecified: Secondary | ICD-10-CM | POA: Diagnosis not present

## 2021-06-18 DIAGNOSIS — F32A Depression, unspecified: Secondary | ICD-10-CM | POA: Diagnosis not present

## 2021-06-18 DIAGNOSIS — Z87891 Personal history of nicotine dependence: Secondary | ICD-10-CM | POA: Diagnosis not present

## 2021-06-18 DIAGNOSIS — C7951 Secondary malignant neoplasm of bone: Secondary | ICD-10-CM

## 2021-06-18 DIAGNOSIS — D500 Iron deficiency anemia secondary to blood loss (chronic): Secondary | ICD-10-CM | POA: Diagnosis not present

## 2021-06-18 DIAGNOSIS — E8809 Other disorders of plasma-protein metabolism, not elsewhere classified: Secondary | ICD-10-CM | POA: Diagnosis not present

## 2021-06-18 DIAGNOSIS — I10 Essential (primary) hypertension: Secondary | ICD-10-CM | POA: Diagnosis not present

## 2021-06-18 DIAGNOSIS — Z7189 Other specified counseling: Secondary | ICD-10-CM | POA: Diagnosis not present

## 2021-06-18 DIAGNOSIS — D630 Anemia in neoplastic disease: Secondary | ICD-10-CM | POA: Diagnosis not present

## 2021-06-18 DIAGNOSIS — L03012 Cellulitis of left finger: Secondary | ICD-10-CM | POA: Diagnosis not present

## 2021-06-18 DIAGNOSIS — Z8249 Family history of ischemic heart disease and other diseases of the circulatory system: Secondary | ICD-10-CM | POA: Diagnosis not present

## 2021-06-18 DIAGNOSIS — F419 Anxiety disorder, unspecified: Secondary | ICD-10-CM | POA: Diagnosis not present

## 2021-06-18 DIAGNOSIS — Z803 Family history of malignant neoplasm of breast: Secondary | ICD-10-CM | POA: Diagnosis not present

## 2021-06-18 DIAGNOSIS — D6959 Other secondary thrombocytopenia: Secondary | ICD-10-CM | POA: Diagnosis not present

## 2021-06-18 DIAGNOSIS — Z832 Family history of diseases of the blood and blood-forming organs and certain disorders involving the immune mechanism: Secondary | ICD-10-CM | POA: Diagnosis not present

## 2021-06-18 DIAGNOSIS — K219 Gastro-esophageal reflux disease without esophagitis: Secondary | ICD-10-CM | POA: Diagnosis not present

## 2021-06-18 DIAGNOSIS — C50411 Malignant neoplasm of upper-outer quadrant of right female breast: Secondary | ICD-10-CM | POA: Diagnosis not present

## 2021-06-18 DIAGNOSIS — Z79891 Long term (current) use of opiate analgesic: Secondary | ICD-10-CM | POA: Diagnosis not present

## 2021-06-18 DIAGNOSIS — Z79899 Other long term (current) drug therapy: Secondary | ICD-10-CM | POA: Diagnosis not present

## 2021-06-18 DIAGNOSIS — C773 Secondary and unspecified malignant neoplasm of axilla and upper limb lymph nodes: Secondary | ICD-10-CM | POA: Diagnosis not present

## 2021-06-18 DIAGNOSIS — X58XXXA Exposure to other specified factors, initial encounter: Secondary | ICD-10-CM | POA: Diagnosis present

## 2021-06-18 DIAGNOSIS — D573 Sickle-cell trait: Secondary | ICD-10-CM | POA: Diagnosis not present

## 2021-06-18 DIAGNOSIS — L02512 Cutaneous abscess of left hand: Secondary | ICD-10-CM | POA: Diagnosis not present

## 2021-06-18 DIAGNOSIS — R601 Generalized edema: Secondary | ICD-10-CM | POA: Diagnosis not present

## 2021-06-18 LAB — APTT: aPTT: 33 seconds (ref 24–36)

## 2021-06-18 LAB — COMPREHENSIVE METABOLIC PANEL
ALT: 20 U/L (ref 0–44)
AST: 75 U/L — ABNORMAL HIGH (ref 15–41)
Albumin: 2 g/dL — ABNORMAL LOW (ref 3.5–5.0)
Alkaline Phosphatase: 166 U/L — ABNORMAL HIGH (ref 38–126)
Anion gap: 9 (ref 5–15)
BUN: 16 mg/dL (ref 6–20)
CO2: 28 mmol/L (ref 22–32)
Calcium: 7.3 mg/dL — ABNORMAL LOW (ref 8.9–10.3)
Chloride: 99 mmol/L (ref 98–111)
Creatinine, Ser: 1.05 mg/dL — ABNORMAL HIGH (ref 0.44–1.00)
GFR, Estimated: >60 mL/min (ref >60)
Glucose, Bld: 85 mg/dL (ref 70–99)
Potassium: 4.5 mmol/L (ref 3.5–5.1)
Sodium: 136 mmol/L (ref 135–145)
Total Bilirubin: 0.6 mg/dL (ref 0.3–1.2)
Total Protein: 5.2 g/dL — ABNORMAL LOW (ref 6.5–8.1)

## 2021-06-18 LAB — CBC
HCT: 25.6 % — ABNORMAL LOW (ref 36.0–46.0)
Hemoglobin: 8.5 g/dL — ABNORMAL LOW (ref 12.0–15.0)
MCH: 28.1 pg (ref 26.0–34.0)
MCHC: 33.2 g/dL (ref 30.0–36.0)
MCV: 84.8 fL (ref 80.0–100.0)
Platelets: 150 10*3/uL (ref 150–400)
RBC: 3.02 MIL/uL — ABNORMAL LOW (ref 3.87–5.11)
RDW: 18.7 % — ABNORMAL HIGH (ref 11.5–15.5)
WBC: 20.3 10*3/uL — ABNORMAL HIGH (ref 4.0–10.5)
nRBC: 0 % (ref 0.0–0.2)

## 2021-06-18 LAB — PROTIME-INR
INR: 1.5 — ABNORMAL HIGH (ref 0.8–1.2)
Prothrombin Time: 18.2 seconds — ABNORMAL HIGH (ref 11.4–15.2)

## 2021-06-18 LAB — MAGNESIUM: Magnesium: 1.1 mg/dL — ABNORMAL LOW (ref 1.7–2.4)

## 2021-06-18 LAB — PREPARE RBC (CROSSMATCH)

## 2021-06-18 MED ORDER — METRONIDAZOLE 500 MG PO TABS: 500.0000 mg | ORAL_TABLET | Freq: Every day | ORAL | Status: DC

## 2021-06-18 MED ORDER — ORAL CARE MOUTH RINSE
15.0000 mL | Freq: Two times a day (BID) | OROMUCOSAL | Status: DC
  Administered 2021-06-18 – 2021-06-26 (×11): 15 mL via OROMUCOSAL

## 2021-06-18 MED ORDER — CHLORHEXIDINE GLUCONATE CLOTH 2 % EX PADS
6.0000 | MEDICATED_PAD | Freq: Every day | CUTANEOUS | Status: DC
  Administered 2021-06-18 – 2021-06-26 (×8): 6 via TOPICAL

## 2021-06-18 MED ORDER — MORPHINE SULFATE (PF) 4 MG/ML IV SOLN
4.0000 mg | Freq: Once | INTRAVENOUS | Status: AC
  Administered 2021-06-18: 4 mg via INTRAVENOUS

## 2021-06-18 MED ORDER — MAGNESIUM SULFATE 4 GM/100ML IV SOLN
4.0000 g | Freq: Once | INTRAVENOUS | Status: AC
  Administered 2021-06-18: 4 g via INTRAVENOUS
  Filled 2021-06-18: qty 100

## 2021-06-18 MED ORDER — SODIUM CHLORIDE 0.9% IV SOLUTION: Freq: Once | INTRAVENOUS | Status: AC

## 2021-06-18 MED ORDER — SODIUM CHLORIDE 0.9% FLUSH
10.0000 mL | Freq: Two times a day (BID) | INTRAVENOUS | Status: DC
  Administered 2021-06-18 – 2021-06-25 (×9): 10 mL

## 2021-06-18 MED ORDER — NONFORMULARY OR COMPOUNDED ITEM
1000.0000 mg | Freq: Every day | Status: DC
  Administered 2021-06-18 – 2021-06-20 (×3): 1000 mg via TOPICAL
  Filled 2021-06-18 (×9): qty 1

## 2021-06-18 NOTE — Progress Notes (Signed)
Attempted to change pt dressing twice once at 1200 and 1600 using the new wound care orders. Pt keeps pushing it off to later and refused again for me to do it now. Will pass it on to night shift. Changed due time of topical Antibiotic to 2000.

## 2021-06-18 NOTE — Progress Notes (Signed)
[facility])   This patient has been referred for hospice services in the community but has not yet been admitted onto hospice services.  Report exchanged with [facility] Randall Hiss and bedside RN Rod Holler.  Plan of care discussed with Dr. Domingo Cocking, PMT.    Met at bedside with pt; voicemail left for pt's mother.  Discussed plan of care with pt who shares that chief complaint at this time is fifth finger of Left hand; pt reports pain 10/10, describes as "excruciating, stabbing and needles and throbbing." Finger is purple near knuckle, pt unable to tolerate any touch to assess cap refill.  Discussed wound care. Pt reports cleaning and changed dressing q daily at this time.  Discussed use of crushed flagyl to reduce odor; pt agreeable to try this. Also discussed topical clotting powders; pt agreeable to try this as needed.  Discussed with WOC RN Melodie as well as Dr. Domingo Cocking.  Reviewed with bedside RN Rod Holler.  Plan made to visit later today and to follow up with pt's mother.  [facility] liaisons will continue to follow for any discharge planning needs and to coordinate admission onto hospice care.    Thank you for the opportunity to participate in this patient's care.     Domenic Moras, BSN, RN [facility] Liaison [phone] (24h on call)

## 2021-06-18 NOTE — Consult Note (Signed)
WOC contacted by [employer]. Chrislyn and I spoke about plan of care for her wounds. Updated wound care orders. Patient has been resistant to wound care from staff.   She has agreed upon some additional care for palliative treatment goal.  Added orders: Crush 2 Flaygl, sprinkle over opened Vaseline gauze. Allow patient to place this over open wound, top with 2-3 ABD pads, secure with tape. Allow patient and CG to change dressing. Teach patient and CG how to crush tabs and apply to the Vaseline gauze for home use.  Bedside nurse is aware of this plan per [place] note.   Re consult if needed, will not follow at this time. Thanks  Murrel Bertram [employer], RN,CWOCN, CNS, [place] [phone])

## 2021-06-18 NOTE — Progress Notes (Signed)
PROGRESS NOTE    Rebecca [name]  PFX:[id number] DOB: 12-Feb-1976 DOA: 06/17/2021 PCP: Dorena Dew, FNP   Chief Complaint  Patient presents with   Nausea   Bleeding/Bruising    Bleeding from tumor from breast cancer   Brief Narrative: 45 year old female with metastatic breast cancer diagnosed 2017, with mets to bone and chest wall GERD presented to the ED with generalized weakness, chest wall pain. As per the report patient was getting her cancer treatment under the care of cancer [facility] for [facility] in [place] and has suggested all treatment modalities and was referred for home hospice about 2 weeks ago .  She was referred for hospice with [facility] after patient contacted Dr. [name] office on 7/14 Patient complains of progressively worsening generalized weakness, also having bleeding from her wounds in the right chest wall and left axilla. Seen in the ED and found to have severe anemia and admitted. She was also found to have cellulitis on the index finger of the left hand  Subjective: Seen and examined this morning. Reports She is feeling okay, she has completed first transfusion   Assessment & Plan:  Symptomatic anemia Severe anemia Anemia of chronic disease/cancer as is chronic blood loss from breast cancer Plan for 2 unit PRBC repeat H&H posttransfusion. Recent Labs  Lab 06/17/21 1850 06/18/21 0809  HGB 5.2* 6.4*  HCT 16.3* 18.6*    Leukocytosis Cellulitis of finger of the left hand Continue antibiotics-count improving.  Add Flagyl for anaerobic coverage for breast lesion. refused chest x-ray,  GERD-cont ppi Mild elevated HGD:JMEQAST Thrombocytopenia in the setting of metastatic disease.  Monitor  Hypomagnesemia-replete  Malignant neoplasm of upper-outer quadrant of right breast in female, estrogen receptor negative metastatic disease: Poor prognosis appears patient has referred for home with hospice after she exhausted all chemotherapeutic  options.  Goals of care, counseling/discussion: Unfortunately full code.  Palliative care consulted.  Anasarca: Peripheral edema with severe hypoalbuminemia anemia likely from poor intake, in the setting of metastatic cancer, supportive measures  Extensive right chest wall wound as well as left axillary wound Patient refused to allow full evaluation of the wound wound care consultation placed.  Suspect necrosis from cancer.  Continue ceftriaxone antibiotic added Flagyl Diet Order             Diet regular Room service appropriate? Yes; Fluid consistency: Thin  Diet effective now                   Patient's Body mass index is 28.34 kg/m.  DVT prophylaxis: SCDs Start: 06/17/21 2101 Code Status:   Code Status: Full Code  Family Communication: plan of care discussed with patient at bedside. Status is: Observation Admit observation status remains hospitalized for ongoing blood transfusion and further management  Dispo: The patient is from: Home              Anticipated d/c is to:  Home with hospice , once hospice can be arranged likely over the weekend. Hospice is not set up as OP.              Patient currently is not medically stable to d/c.   Difficult to place patient No Unresulted Labs (From admission, onward)     Start     Ordered   06/17/21 2121  Culture, blood (routine x 2)  BLOOD CULTURE X 2,   R (with STAT occurrences)     Comments: for severe disease only    06/17/21 2121  Medications reviewed: Scheduled Meds:  sodium chloride   Intravenous Once   Chlorhexidine Gluconate Cloth  6 each Topical Daily   gabapentin  300 mg Oral TID   mouth rinse  15 mL Mouth Rinse BID   methadone  5 mg Oral Q8H   nutrition supplement (JUVEN)  1 packet Oral BID BM   pantoprazole  40 mg Oral Daily   polyethylene glycol  17 g Oral Daily   sodium chloride flush  10-40 mL Intracatheter Q12H   Continuous Infusions:  cefTRIAXone (ROCEPHIN)  IV 1 g (06/18/21 0133)    magnesium sulfate bolus IVPB     Consultants:see note  Procedures:see note Antimicrobials: Anti-infectives (From admission, onward)    Start     Dose/Rate Route Frequency Ordered Stop   06/17/21 2200  cefTRIAXone (ROCEPHIN) 1 g in sodium chloride 0.9 % 100 mL IVPB        1 g 200 mL/hr over 30 Minutes Intravenous Every 24 hours 06/17/21 2121     06/17/21 1745  doxycycline (VIBRAMYCIN) 100 mg in sodium chloride 0.9 % 250 mL IVPB        100 mg 125 mL/hr over 120 Minutes Intravenous  Once 06/17/21 1739 06/17/21 2052   06/17/21 0000  doxycycline (VIBRAMYCIN) 100 MG capsule        100 mg Oral 2 times daily 06/17/21 1747        Culture/Microbiology    Component Value Date/Time   SDES BLOOD PORTA CATH 08/14/2020 1450   SPECREQUEST  08/14/2020 1450    BOTTLES DRAWN AEROBIC AND ANAEROBIC Blood Culture adequate volume   CULT  08/14/2020 1450    NO GROWTH 5 DAYS Performed at [facility], [place] [address] [place], [place] [address]    REPTSTATUS 08/19/2020 FINAL 08/14/2020 1450    Other culture-see note  Objective: Vitals: Today's Vitals   06/18/21 0256 06/18/21 0353 06/18/21 0615 06/18/21 0800  BP: 124/75 98/66 91/71    Pulse: (!) 129 (!) 105 97   Resp: 20 12 20    Temp: (!) 97.4 F (36.3 C) 97.9 F (36.6 C) 97.8 F (36.6 C)   TempSrc: Oral Oral Oral   SpO2: 100% 100% 100%   Weight:      Height:      PainSc:    Asleep    Intake/Output Summary (Last 24 hours) at 06/18/2021 0851 Last data filed at 06/18/2021 0800 Gross per 24 hour  Intake 1114.51 ml  Output --  Net 1114.51 ml   Filed Weights   06/17/21 1724  Weight: 68 kg   Weight change:   Intake/Output from previous day: 07/14 0701 - 07/15 0700 In: 886.5 [I.V.:128.4; Blood:408; IV Piggyback:350.1] Out: -  Intake/Output this shift: Total I/O In: 228 [I.V.:228] Out: -  Filed Weights   06/17/21 1724  Weight: 68 kg   Examination: General exam: AAOx3, older than his stated age weak frail HEENT:Oral mucosa  moist, Ear/Nose WNL grossly,dentition normal. Respiratory system: bilaterally diminished, no use of accessory muscle, non tender. Cardiovascular system: S1 & S2 +,No JVD. Gastrointestinal system: Abdomen soft, NT,ND, BS+. Nervous System:Alert, awake, moving extremities Extremities:distal peripheral pulses palpable.  Skin: No rashes,no icterus.  Visually breast area with dressing in place-did not remove it. MSK: Normal muscle bulk,tone, power Data Reviewed: I have personally reviewed following labs and imaging studies CBC: Recent Labs  Lab 06/17/21 1850 06/18/21 0809  WBC 21.2* 15.3*  NEUTROABS 18.9* 12.7*  HGB 5.2* 6.4*  HCT 16.3* 18.6*  MCV 85.3 85.3  PLT 158 425*   Basic Metabolic Panel: Recent Labs  Lab 06/17/21 1850 06/18/21 0809  NA 137 136  K 4.4 4.5  CL 97* 99  CO2 24 28  GLUCOSE 101* 85  BUN 15 16  CREATININE 1.17* 1.05*  CALCIUM 7.9* 7.3*  MG  --  1.1*   GFR: Estimated Creatinine Clearance: 58.5 mL/min (A) (by C-G formula based on SCr of 1.05 mg/dL (H)). Liver Function Tests: Recent Labs  Lab 06/17/21 1850 06/18/21 0809  AST 52* 75*  ALT 20 20  ALKPHOS 192* 166*  BILITOT 0.5 0.6  PROT 5.8* 5.2*  ALBUMIN 2.2* 2.0*   No results for input(s): LIPASE, AMYLASE in the last 168 hours. No results for input(s): AMMONIA in the last 168 hours. Coagulation Profile: Recent Labs  Lab 06/18/21 0809  INR 1.5*   Cardiac Enzymes: No results for input(s): CKTOTAL, CKMB, CKMBINDEX, TROPONINI in the last 168 hours. BNP (last 3 results) No results for input(s): PROBNP in the last 8760 hours. HbA1C: No results for input(s): HGBA1C in the last 72 hours. CBG: No results for input(s): GLUCAP in the last 168 hours. Lipid Profile: No results for input(s): CHOL, HDL, LDLCALC, TRIG, CHOLHDL, LDLDIRECT in the last 72 hours. Thyroid Function Tests: No results for input(s): TSH, T4TOTAL, FREET4, T3FREE, THYROIDAB in the last 72 hours. Anemia Panel: No results for  input(s): VITAMINB12, FOLATE, FERRITIN, TIBC, IRON, RETICCTPCT in the last 72 hours. Sepsis Labs: No results for input(s): PROCALCITON, LATICACIDVEN in the last 168 hours.  Recent Results (from the past 240 hour(s))  Resp Panel by RT-PCR (Flu A&B, Covid) Nasopharyngeal Swab     Status: None   Collection Time: 06/17/21  9:47 PM   Specimen: Nasopharyngeal Swab; Nasopharyngeal(NP) swabs in vial transport medium  Result Value Ref Range Status   SARS Coronavirus 2 by RT PCR NEGATIVE NEGATIVE Final    Comment: (NOTE) SARS-CoV-2 target nucleic acids are NOT DETECTED.  The SARS-CoV-2 RNA is generally detectable in upper respiratory specimens during the acute phase of infection. The lowest concentration of SARS-CoV-2 viral copies this assay can detect is 138 copies/mL. A negative result does not preclude SARS-Cov-2 infection and should not be used as the sole basis for treatment or other patient management decisions. A negative result may occur with  improper specimen collection/handling, submission of specimen other than nasopharyngeal swab, presence of viral mutation(s) within the areas targeted by this assay, and inadequate number of viral copies(<138 copies/mL). A negative result must be combined with clinical observations, patient history, and epidemiological information. The expected result is Negative.  Fact Sheet for Patients:  EntrepreneurPulse.com.au  Fact Sheet for Healthcare Providers:  IncredibleEmployment.be  This test is no t yet approved or cleared by the [place] FDA and  has been authorized for detection and/or diagnosis of SARS-CoV-2 by FDA under an Emergency Use Authorization (EUA). This EUA will remain  in effect (meaning this test can be used) for the duration of the COVID-19 declaration under Section 564(b)(1) of the Act, 21 U.S.C.section 360bbb-3(b)(1), unless the authorization is terminated  or revoked sooner.        Influenza A by PCR NEGATIVE NEGATIVE Final   Influenza B by PCR NEGATIVE NEGATIVE Final    Comment: (NOTE) The Xpert Xpress SARS-CoV-2/FLU/RSV plus assay is intended as an aid in the diagnosis of influenza from Nasopharyngeal swab specimens and should not be used as a sole basis for treatment. Nasal washings and aspirates are unacceptable for Xpert Xpress SARS-CoV-2/FLU/RSV testing.  Fact  Sheet for Patients: EntrepreneurPulse.com.au  Fact Sheet for Healthcare Providers: IncredibleEmployment.be  This test is not yet approved or cleared by the [place] FDA and has been authorized for detection and/or diagnosis of SARS-CoV-2 by FDA under an Emergency Use Authorization (EUA). This EUA will remain in effect (meaning this test can be used) for the duration of the COVID-19 declaration under Section 564(b)(1) of the Act, 21 U.S.C. section 360bbb-3(b)(1), unless the authorization is terminated or revoked.  Performed at [facility], [place] [address] [place], [place] [address]     Radiology Studies: No results found.   LOS: 0 days   Antonieta Pert, MD [facility]  06/18/2021, 8:51 AM

## 2021-06-18 NOTE — TOC Initial Note (Signed)
Transition of Care [facility]) - Initial/Assessment Note    Patient Details  Name: Rebecca [name] MRN: [id number] Date of Birth: May 19, 1976  Transition of [facility]) CM/SW Contact:    Ross Ludwig, LCSW Phone Number: 06/18/2021, 5:04 PM  Clinical Narrative:                  Patient is a 45 year old female who lives with her mother and has Medicaid.  Per bedside nurse and palliative team the plan is to return home on Sunday with home hospice services through [facility].  Patient will have same day start of care.  CSW to follow up with [facility] to coordinate discharge time on Sunday.  CSW to continue to follow patient's progress throughout discharge planning.  Expected Discharge Plan: Home w Hospice Care Barriers to Discharge: Continued Medical Work up   Patient Goals and CMS Choice Patient states their goals for this hospitalization and ongoing recovery are:: To return home with home hospice services. CMS Medicare.gov Compare Post Acute Care list provided to:: Patient Choice offered to / list presented to : Patient  Expected Discharge Plan and Services Expected Discharge Plan: [place] arrangements for the past 2 months: [place] Agency: [facility] [place] Date [place]: 06/18/21 Time HH Agency Contacted: 1200 Representative spoke with at [place]: [place] Arrangements/Services Living arrangements for the past 2 months: [place] Lives with:: Relatives, Parents Patient language and need for interpreter reviewed:: Yes Do you feel safe going back to the place where you live?: Yes      Need for Family Participation in Patient Care: No (Comment) Care giver support system in place?: No (comment)   Criminal Activity/Legal Involvement Pertinent to Current Situation/Hospitalization: No - Comment as needed  Activities of Daily Living Home  Assistive Devices/Equipment: Walker (specify type) ADL Screening (condition at time of admission) Patient's cognitive ability adequate to safely complete daily activities?: Yes Is the patient deaf or have difficulty hearing?: No Does the patient have difficulty seeing, even when wearing glasses/contacts?: No Does the patient have difficulty concentrating, remembering, or making decisions?: No Patient able to express need for assistance with ADLs?: Yes Does the patient have difficulty dressing or bathing?: Yes Independently performs ADLs?: No Communication: Independent Dressing (OT): Needs assistance Is this a change from baseline?: Pre-admission baseline Grooming: Independent Feeding: Independent Bathing: Needs assistance Is this a change from baseline?: Pre-admission baseline Toileting: Needs assistance Is this a change from baseline?: Pre-admission baseline In/Out Bed: Needs assistance Is this a change from baseline?: Pre-admission baseline Walks in Home: Needs assistance Is this a change from baseline?: Pre-admission baseline Does the patient have difficulty walking or climbing stairs?: Yes Weakness of Legs: Both Weakness of Arms/Hands: Both  Permission Sought/Granted Permission sought to share information with : Facility Sport and exercise psychologist, Family Supports Permission granted to share information with : Yes, Release of Information Signed  Share Information with NAME: [name] Mother [phone]  [phone]  Permission granted to share info w AGENCY: [place] services.        Emotional Assessment Appearance:: Appears stated age   Affect (typically observed): Accepting, Afraid/Fearful, Angry, Appropriate Orientation: : Oriented to Self, Oriented to Place, Oriented to  Time, Oriented to Situation  Admission diagnosis:  Pneumonia [J18.9] Bone metastases ([place]) [C79.51] Cellulitis of finger of left hand [K38.381] Symptomatic anemia  [D64.9] Anemia, unspecified type [D64.9] Malignant neoplasm of upper-outer quadrant of right breast in female, estrogen receptor negative ([place]) [C50.411, Z17.1] Severe anemia [D64.9] Patient Active Problem List   Diagnosis Date Noted   Severe anemia 06/18/2021   Symptomatic anemia 06/17/2021   GERD without esophagitis 06/17/2021   Cellulitis of finger of left hand 06/17/2021   Anasarca 06/17/2021   Neutropenic fever ([place]) 08/14/2020   Sepsis ([place]) 08/14/2020   Acute lower UTI 08/14/2020   Open chest wound, right, initial encounter 08/14/2020   Anemia associated with chemotherapy 08/14/2020   Neuropathy due to chemotherapeutic drug ([place]) 07/10/2019   Recurrent breast adenocarcinoma ([place]) 07/02/2019   Bone metastases ([place]) 07/02/2019   Goals of care, counseling/discussion 07/02/2019   Morbid obesity with body mass index (BMI) of 40.0 to 44.9 in adult [facility]) 04/10/2018   Genetic testing 11/20/2016   Family history of breast cancer 11/20/2016   Malignant neoplasm of upper-outer quadrant of right breast in female, estrogen receptor negative ([place]) 02/04/2016   Hirsutism 01/13/2016   Tobacco dependence 01/13/2016   Irregular periods/menstrual cycles 01/13/2016   PCP:  Dorena Dew, FNP Pharmacy:   [id number] - [place], [place] [address] Phone: [phone] Fax: [phone]     Social Determinants of Health (SDOH) Interventions    Readmission Risk Interventions No flowsheet data found.

## 2021-06-18 NOTE — Plan of Care (Signed)
Received pt on stretcher accompanied by  ER NT, pt alert, able to transfer self to bed with minimal assist, pt refused wound assessment on her right side of her breast, dressing is soaked with red drainage, pt states that she do her own dressing with the help of her mom, that is present at the bedside, this RN provided supplies for the dressing to be changed, offered assistance but pt refused, no s/s of respiratory distress O2 sat 97% on 2L [place] and will continue plan of care.  Problem: Health Behavior/Discharge Planning: Goal: Ability to manage health-related needs will improve Outcome: Progressing   Problem: Pain Managment: Goal: General experience of comfort will improve Outcome: Progressing   Problem: Skin Integrity: Goal: Risk for impaired skin integrity will decrease Outcome: Progressing

## 2021-06-18 NOTE — Consult Note (Signed)
[place] Nurse wound consult note Consultation was completed by review of records, images and assistance from the bedside nurse/clinical staff.   Reason for Consult: breast fungating tumor Patient to be admitted to hospice this week at home, came in for SOB and finger pain Wound type: metastatic breast tumor right chest Pressure Injury POA: NA Measurement: see nursing flowsheets; patient has refused to allow nursing to perform wound care however. Unable to obtain measurements Wound bed: unable to assess; patient and her mother perform wound care Drainage (amount, consistency, odor) red per nursing notes  Dressing procedure/placement/frequency: continue with patient's current therapy   Addendum; later in the day 06/18/21 I was contacted by the [place] RN We updated the POC; orders written for 2 tabs of 500mg  Flagyl applied to Vaseline gauze, top with 2-3 ABD pads, secured with tape.  Allow patient to place dressings.  Teach patient to crush meds to be applied to dressings.   Re consult if needed, will not follow at this time. Thanks  Jarely Juncaj [employer], RN,CWOCN, CNS, [place] [phone])

## 2021-06-19 ENCOUNTER — Inpatient Hospital Stay (HOSPITAL_COMMUNITY): Payer: Medicaid Other | Admitting: Certified Registered Nurse Anesthetist

## 2021-06-19 ENCOUNTER — Encounter (HOSPITAL_COMMUNITY)
Admission: EM | Disposition: A | Discharge status: Hospice | Payer: Self-pay | Source: Home / Self Care | Attending: Internal Medicine

## 2021-06-19 DIAGNOSIS — D649 Anemia, unspecified: Secondary | ICD-10-CM | POA: Diagnosis not present

## 2021-06-19 DIAGNOSIS — Z7189 Other specified counseling: Secondary | ICD-10-CM | POA: Diagnosis not present

## 2021-06-19 DIAGNOSIS — M65142 Other infective (teno)synovitis, left hand: Secondary | ICD-10-CM | POA: Diagnosis not present

## 2021-06-19 DIAGNOSIS — L03012 Cellulitis of left finger: Secondary | ICD-10-CM | POA: Diagnosis not present

## 2021-06-19 DIAGNOSIS — Z515 Encounter for palliative care: Secondary | ICD-10-CM | POA: Diagnosis not present

## 2021-06-19 DIAGNOSIS — C773 Secondary and unspecified malignant neoplasm of axilla and upper limb lymph nodes: Secondary | ICD-10-CM | POA: Diagnosis not present

## 2021-06-19 DIAGNOSIS — K219 Gastro-esophageal reflux disease without esophagitis: Secondary | ICD-10-CM | POA: Diagnosis not present

## 2021-06-19 DIAGNOSIS — Z20822 Contact with and (suspected) exposure to covid-19: Secondary | ICD-10-CM | POA: Diagnosis not present

## 2021-06-19 DIAGNOSIS — D500 Iron deficiency anemia secondary to blood loss (chronic): Secondary | ICD-10-CM | POA: Diagnosis not present

## 2021-06-19 DIAGNOSIS — D6959 Other secondary thrombocytopenia: Secondary | ICD-10-CM | POA: Diagnosis not present

## 2021-06-19 DIAGNOSIS — F32A Depression, unspecified: Secondary | ICD-10-CM | POA: Diagnosis not present

## 2021-06-19 DIAGNOSIS — I10 Essential (primary) hypertension: Secondary | ICD-10-CM | POA: Diagnosis not present

## 2021-06-19 DIAGNOSIS — E8809 Other disorders of plasma-protein metabolism, not elsewhere classified: Secondary | ICD-10-CM | POA: Diagnosis not present

## 2021-06-19 DIAGNOSIS — C7951 Secondary malignant neoplasm of bone: Secondary | ICD-10-CM | POA: Diagnosis not present

## 2021-06-19 DIAGNOSIS — D630 Anemia in neoplastic disease: Secondary | ICD-10-CM | POA: Diagnosis not present

## 2021-06-19 DIAGNOSIS — C50411 Malignant neoplasm of upper-outer quadrant of right female breast: Secondary | ICD-10-CM | POA: Diagnosis not present

## 2021-06-19 DIAGNOSIS — L02512 Cutaneous abscess of left hand: Secondary | ICD-10-CM | POA: Diagnosis not present

## 2021-06-19 HISTORY — PX: I & D EXTREMITY: SHX5045

## 2021-06-19 LAB — CBC
HCT: 24.6 % — ABNORMAL LOW (ref 36.0–46.0)
Hemoglobin: 8 g/dL — ABNORMAL LOW (ref 12.0–15.0)
MCH: 27.8 pg (ref 26.0–34.0)
MCHC: 32.5 g/dL (ref 30.0–36.0)
MCV: 85.4 fL (ref 80.0–100.0)
Platelets: 134 10*3/uL — ABNORMAL LOW (ref 150–400)
RBC: 2.88 MIL/uL — ABNORMAL LOW (ref 3.87–5.11)
RDW: 19 % — ABNORMAL HIGH (ref 11.5–15.5)
WBC: 16.7 10*3/uL — ABNORMAL HIGH (ref 4.0–10.5)
nRBC: 0 % (ref 0.0–0.2)

## 2021-06-19 LAB — SURGICAL PCR SCREEN
MRSA, PCR: NEGATIVE
Staphylococcus aureus: NEGATIVE

## 2021-06-19 SURGERY — IRRIGATION AND DEBRIDEMENT EXTREMITY
Anesthesia: General | Site: Hand | Laterality: Left

## 2021-06-19 MED ORDER — ROCURONIUM BROMIDE 10 MG/ML (PF) SYRINGE
PREFILLED_SYRINGE | INTRAVENOUS | Status: DC | PRN
  Administered 2021-06-19: 10 mg via INTRAVENOUS

## 2021-06-19 MED ORDER — MIDAZOLAM HCL 5 MG/5ML IJ SOLN
INTRAMUSCULAR | Status: DC | PRN
  Administered 2021-06-19: 2 mg via INTRAVENOUS

## 2021-06-19 MED ORDER — LACTATED RINGERS IV SOLN: INTRAVENOUS | Status: DC | PRN

## 2021-06-19 MED ORDER — FENTANYL CITRATE (PF) 100 MCG/2ML IJ SOLN
INTRAMUSCULAR | Status: DC | PRN
  Administered 2021-06-19: 100 ug via INTRAVENOUS

## 2021-06-19 MED ORDER — ONDANSETRON HCL 4 MG/2ML IJ SOLN
INTRAMUSCULAR | Status: DC | PRN
  Administered 2021-06-19: 4 mg via INTRAVENOUS

## 2021-06-19 MED ORDER — LACTATED RINGERS IV SOLN: INTRAVENOUS | Status: DC

## 2021-06-19 MED ORDER — MIDAZOLAM HCL 2 MG/2ML IJ SOLN
INTRAMUSCULAR | Status: AC
  Filled 2021-06-19: qty 2

## 2021-06-19 MED ORDER — DOXYCYCLINE HYCLATE 100 MG PO TABS
100.0000 mg | ORAL_TABLET | Freq: Two times a day (BID) | ORAL | Status: DC
  Administered 2021-06-19 – 2021-06-26 (×15): 100 mg via ORAL
  Filled 2021-06-19 (×15): qty 1

## 2021-06-19 MED ORDER — HYDROMORPHONE HCL 1 MG/ML IJ SOLN
0.2500 mg | INTRAMUSCULAR | Status: DC | PRN
  Administered 2021-06-19 (×2): 0.5 mg via INTRAVENOUS

## 2021-06-19 MED ORDER — HYDROMORPHONE HCL 1 MG/ML IJ SOLN
INTRAMUSCULAR | Status: AC
  Filled 2021-06-19: qty 1

## 2021-06-19 MED ORDER — FENTANYL CITRATE (PF) 100 MCG/2ML IJ SOLN
INTRAMUSCULAR | Status: AC
  Filled 2021-06-19: qty 2

## 2021-06-19 MED ORDER — PROSOURCE PLUS PO LIQD
30.0000 mL | Freq: Two times a day (BID) | ORAL | Status: DC
  Administered 2021-06-20 – 2021-06-25 (×7): 30 mL via ORAL
  Filled 2021-06-19 (×7): qty 30

## 2021-06-19 MED ORDER — SUCCINYLCHOLINE CHLORIDE 200 MG/10ML IV SOSY
PREFILLED_SYRINGE | INTRAVENOUS | Status: DC | PRN
  Administered 2021-06-19: 120 mg via INTRAVENOUS

## 2021-06-19 MED ORDER — MUPIROCIN 2 % EX OINT
TOPICAL_OINTMENT | CUTANEOUS | Status: AC
  Filled 2021-06-19: qty 22

## 2021-06-19 MED ORDER — BUPIVACAINE HCL (PF) 0.5 % IJ SOLN
INTRAMUSCULAR | Status: AC
  Filled 2021-06-19: qty 30

## 2021-06-19 MED ORDER — BUPIVACAINE-EPINEPHRINE 0.25% -1:200000 IJ SOLN
INTRAMUSCULAR | Status: AC
  Filled 2021-06-19: qty 1

## 2021-06-19 MED ORDER — PROPOFOL 10 MG/ML IV BOLUS
INTRAVENOUS | Status: DC | PRN
  Administered 2021-06-19: 50 mg via INTRAVENOUS
  Administered 2021-06-19: 80 mg via INTRAVENOUS

## 2021-06-19 MED ORDER — LIDOCAINE 2% (20 MG/ML) 5 ML SYRINGE
INTRAMUSCULAR | Status: DC | PRN
  Administered 2021-06-19: 60 mg via INTRAVENOUS

## 2021-06-19 MED ORDER — 0.9 % SODIUM CHLORIDE (POUR BTL) OPTIME
TOPICAL | Status: DC | PRN
  Administered 2021-06-19: 3000 mL

## 2021-06-19 MED ORDER — FENTANYL CITRATE (PF) 100 MCG/2ML IJ SOLN
25.0000 ug | INTRAMUSCULAR | Status: DC | PRN
  Administered 2021-06-19 (×2): 50 ug via INTRAVENOUS

## 2021-06-19 SURGICAL SUPPLY — 41 items
BAG COUNTER SPONGE SURGICOUNT (BAG) IMPLANT
BAG SPEC THK2 15X12 ZIP CLS (MISCELLANEOUS) ×1
BAG SPNG CNTER NS LX DISP (BAG)
BAG ZIPLOCK 12X15 (MISCELLANEOUS) ×2 IMPLANT
BLADE SURG SZ10 CARB STEEL (BLADE) ×2 IMPLANT
BNDG CMPR 9X4 STRL LF SNTH (GAUZE/BANDAGES/DRESSINGS) ×1
BNDG ELASTIC 3X5.8 VLCR STR LF (GAUZE/BANDAGES/DRESSINGS) ×1 IMPLANT
BNDG ELASTIC 4X5.8 VLCR STR LF (GAUZE/BANDAGES/DRESSINGS) ×2 IMPLANT
BNDG ESMARK 4X9 LF (GAUZE/BANDAGES/DRESSINGS) ×2 IMPLANT
BNDG GAUZE ELAST 4 BULKY (GAUZE/BANDAGES/DRESSINGS) ×2 IMPLANT
COVER MAYO STAND STRL (DRAPES) ×2 IMPLANT
COVER SURGICAL LIGHT HANDLE (MISCELLANEOUS) ×2 IMPLANT
CUFF TOURN SGL QUICK 18X4 (TOURNIQUET CUFF) ×2 IMPLANT
DRAIN PENROSE 0.5X18 (DRAIN) ×2 IMPLANT
DRAPE SURG 17X11 SM STRL (DRAPES) ×4 IMPLANT
DRSG EMULSION OIL 3X3 NADH (GAUZE/BANDAGES/DRESSINGS) ×2 IMPLANT
DRSG PAD ABDOMINAL 8X10 ST (GAUZE/BANDAGES/DRESSINGS) ×2 IMPLANT
ELECT REM PT RETURN 15FT ADLT (MISCELLANEOUS) ×2 IMPLANT
GAUZE SPONGE 4X4 12PLY STRL (GAUZE/BANDAGES/DRESSINGS) ×2 IMPLANT
GLOVE SURG ORTHO LTX SZ8 (GLOVE) ×2 IMPLANT
GOWN STRL REUS W/TWL XL LVL3 (GOWN DISPOSABLE) ×2 IMPLANT
IV LACTATED RINGER IRRG 3000ML (IV SOLUTION) ×2
IV LR IRRIG 3000ML ARTHROMATIC (IV SOLUTION) ×1 IMPLANT
KIT BASIN OR (CUSTOM PROCEDURE TRAY) ×2 IMPLANT
KIT TURNOVER KIT A (KITS) ×2 IMPLANT
MANIFOLD NEPTUNE II (INSTRUMENTS) ×2 IMPLANT
PACK ORTHO EXTREMITY (CUSTOM PROCEDURE TRAY) ×2 IMPLANT
PAD CAST 4YDX4 CTTN HI CHSV (CAST SUPPLIES) ×1 IMPLANT
PADDING CAST COTTON 4X4 STRL (CAST SUPPLIES) ×2
PENCIL SMOKE EVACUATOR (MISCELLANEOUS) IMPLANT
PROTECTOR NERVE ULNAR (MISCELLANEOUS) ×2 IMPLANT
SOL PREP POV-IOD 4OZ 10% (MISCELLANEOUS) ×2 IMPLANT
SOL PREP PROV IODINE SCRUB 4OZ (MISCELLANEOUS) ×2 IMPLANT
SUT PROLENE 3 0 PS 2 (SUTURE) ×2 IMPLANT
SUT VIC AB 1 CT1 27 (SUTURE) ×2
SUT VIC AB 1 CT1 27XBRD ANTBC (SUTURE) ×1 IMPLANT
SUT VIC AB 2-0 CT1 27 (SUTURE) ×2
SUT VIC AB 2-0 CT1 27XBRD (SUTURE) ×1 IMPLANT
SYR 20ML LL LF (SYRINGE) ×2 IMPLANT
SYR CONTROL 10ML LL (SYRINGE) ×2 IMPLANT
TOWEL OR 17X26 10 PK STRL BLUE (TOWEL DISPOSABLE) ×2 IMPLANT

## 2021-06-19 NOTE — Progress Notes (Signed)
Resumed care of patient from [employer], Therapist, sports. Agree with her previous assessment. Patient currently resting in bed with no needs at this time. Will continue to monitor patient.

## 2021-06-19 NOTE — Anesthesia Preprocedure Evaluation (Addendum)
Anesthesia Evaluation  Patient identified by MRN, date of birth, ID band Patient awake    Reviewed: Allergy & Precautions, NPO status , Patient's Chart, lab work & pertinent test results  Airway Mallampati: I  TM Distance: >3 FB Neck ROM: Full    Dental no notable dental hx. (+) Teeth Intact, Dental Advisory Given   Pulmonary neg pulmonary ROS, former smoker,    Pulmonary exam normal breath sounds clear to auscultation       Cardiovascular hypertension,  Rhythm:Regular Rate:Tachycardia     Neuro/Psych PSYCHIATRIC DISORDERS Anxiety Depression negative neurological ROS     GI/Hepatic Neg liver ROS, GERD  Controlled and Medicated,  Endo/Other  negative endocrine ROS  Renal/GU negative Renal ROS  negative genitourinary   Musculoskeletal negative musculoskeletal ROS (+)   Abdominal   Peds  Hematology  (+) Blood dyscrasia (Hgb 5.2-->8.0 s/p 2U RBCs), Sickle cell trait and anemia ,   Anesthesia Other Findings 45 year old female with past medical history of metastatic right breast cancer (Dx 2017, metastatic to bone and chest wall), gastroesophageal reflux disease who presents to [facility] emergency department with complaints of generalized weakness and chest wall pain. Found to have left 5th digit abscess now presenting for I&D. Last solid food 1 hour ago. Per surgeon, emergent case; cannot wait for NPO appropriate.   Reproductive/Obstetrics Metastatic breast CA                          Anesthesia Physical Anesthesia Plan  ASA: 3  Anesthesia Plan: General   Post-op Pain Management:    Induction: Intravenous and Rapid sequence  PONV Risk Score and Plan: 3 and Midazolam, Dexamethasone and Ondansetron  Airway Management Planned: Oral ETT  Additional Equipment:   Intra-op Plan:   Post-operative Plan: Extubation in OR  Informed Consent: I have reviewed the patients History and  Physical, chart, labs and discussed the procedure including the risks, benefits and alternatives for the proposed anesthesia with the patient or authorized representative who has indicated his/her understanding and acceptance.     Dental advisory given  Plan Discussed with: CRNA  Anesthesia Plan Comments:         Anesthesia Quick Evaluation

## 2021-06-19 NOTE — Op Note (Addendum)
PREOPERATIVE DIAGNOSIS: Left small finger abscess and flexor sheath infection  POSTOPERATIVE DIAGNOSIS: Same  ATTENDING SURGEON: Dr. Iran Planas who scrubbed and present for the entire procedure  ASSISTANT SURGEON: None  ANESTHESIA: General via endotracheal tube  OPERATIVE PROCEDURE: Left small finger flexor sheath incision and drainage tendon sheath Left small finger and hand drainage of complicated abscess deep space abscess  IMPLANTS: None  EBL: Minimal  RADIOGRAPHIC INTERPRETATION: None  SURGICAL INDICATIONS: Patient is a left-hand-dominant female who is immunocompromised with metastatic breast cancer.  Patient had a worsening pain and swelling of the finger.  Patient was seen and evaluated and recommended undergo the above procedure.  The risks of surgery include but not limited to bleeding infection damage nearby nerves arteries or tendons loss of motion of the wrist and digits incomplete relief of symptoms and need for further surgical invention.  SURGICAL TECHNIQUE: Patient was palpated via the preoperative holding area marked apart a marker made on the left small finger to indicate correct operative site.  Patient brought back operating placed supine on anesthesia table where the general anesthetic was administered.  Patient had been on.  Previously antibiotics intravenously.  A well-padded tourniquet placed on the left forearm and seal with the appropriate drape.  Left upper extremities then prepped and draped normal sterile fashion.  Timeout was called the correct site identified procedure then begun.  Attention then turned to the left hand the limb was elevated tourniquet insufflated and oblique incision made directly over the small finger proximal phalangeal region.  Dissection carried down through the skin and subcutaneous tissue where there is a large amount of purulence that was expressed.  This extended all the way down to the flexor sheath.  A counterincision was made  proximally.  Opening of the flexor sheath was then done and a thorough on the flexor sheath was then exposed proximally.  Thorough wound irrigation done.  The abscess area was then decompressed the extended and the small finger into the palm of the hand.  This is a deep abscess.  After drainage of the palmar abscess and irrigation of the flexor sheath the wounds were then loosely reapproximated with simple Prolene sutures.  Adaptic dressing a sterile compressive bandage then applied.  Intraoperative wound cultures were taken.  Patient tolerated the procedure well returned to recovery room extubated in good condition.  Debridement type: Excisional Debridement  Side: left  Body Location:Small finger  Tools used for debridement: scalpel and scissors  Debridement depth beyond dead/damaged tissue down to healthy viable tissue: yes  Tissue layer involved: skin, subcutaneous tissue, muscle / fascia  Nature of tissue removed: Purulence  Irrigation volume: 2 Liters    Irrigation fluid type: Normal Saline    POSTOPERATIVE PLAN: Patient admitted back to the internal medicine service.  The patient can have dressing changes beginning on Monday Adaptic dressings and dry dressings over the Adaptic as she is still getting wound care for the metastatic lesions on her chest.  Wound care service  or nursing staff can do this.  I will reexamine her look at the finger on Wednesday of this week.  Please contact me should any issues or concerns arise.

## 2021-06-19 NOTE — Consult Note (Signed)
Reason for Consult:LEFT HAND ABSCESS Referring Physician: DR. Eulogio Ditch Rebecca [name] is an 45 y.o. female.  HPI: PT WITH METASTATIC BREAST CANCER. PT WORSENING INFECTION IN LEFT HAND PT WITH WORSENING PAIN AND SWELLING IN LEFT HAND IMMUNOCOMPROMISED  Past Medical History:  Diagnosis Date   Anxiety    Breast cancer ([place])    Depression    GERD (gastroesophageal reflux disease)    History of radiation therapy 11/15/16-01/12/17   right chest wall and axilla treated to 45 Gy in 25 fractions, boosted and additional 14 Gy in 8 fractions   Hypertension    diet controlled   Obesity (BMI 35.0-39.9 without comorbidity)    Pneumonia    as a child   Seasonal allergies    Sickle cell trait ([place])    Termination of pregnancy (fetus) 04/02/16    Past Surgical History:  Procedure Laterality Date   CESAREAN SECTION     2004 and 2007   MASTECTOMY W/ SENTINEL NODE BIOPSY Right 09/06/2016   Procedure: RIGHT BREAST MASTECTOMY WITH RIGHT AXILLARY SENTINEL LYMPH NODE BIOPSY;  Surgeon: Alphonsa Overall, MD;  Location: [facility];  Service: General;  Laterality: Right;   PORT-A-CATH REMOVAL Left 09/06/2016   Procedure: REMOVAL PORT-A-CATH;  Surgeon: Alphonsa Overall, MD;  Location: [facility];  Service: General;  Laterality: Left;   PORTACATH PLACEMENT     PORTACATH PLACEMENT N/A 07/09/2019   Procedure: INSERTION PORT-A-CATH WITH ULTRASOUND;  Surgeon: Alphonsa Overall, MD;  Location: [facility];  Service: General;  Laterality: N/A;    Family History  Problem Relation Age of Onset   Hypertension Mother    Cancer Mother        dx "intestinal cancer" in her 63s; +surgery   Other Mother        hysterectomy at young age for unspecified cause   Heart Problems Mother    Breast cancer Cousin        maternal 1st cousin dx female breast cancer at 57-46y   Cancer Father    Hypertension Father    Heart Problems Maternal Aunt    Diabetes Maternal Aunt    Breast cancer Maternal Uncle         dx 64-65   Heart Problems Maternal Uncle    Breast cancer Maternal Grandmother 50   Throat cancer Maternal Grandfather        d. 25s; smoker   Sickle cell anemia Paternal Aunt    Congestive Heart Failure Maternal Aunt    Multiple sclerosis Cousin    Cancer Other        maternal great uncle (MGM's brother); cancer removed from his side   Heart attack Paternal Aunt        d. early 21s    Social History:  reports that she quit smoking about 3 years ago. Her smoking use included cigarettes. She has a 20.00 pack-year smoking history. She has never used smokeless tobacco. She reports current alcohol use. She reports that she does not use drugs.  Allergies: No Known Allergies  Medications: I have reviewed the patient's current medications.  Results for orders placed or performed during the hospital encounter of 06/17/21 (from the past 48 hour(s))  CBC with Differential     Status: Abnormal   Collection Time: 06/17/21  6:50 PM  Result Value Ref Range   WBC 21.2 (H) 4.0 - 10.5 K/uL   RBC 1.91 (L) 3.87 - 5.11 MIL/uL   Hemoglobin 5.2 (LL) 12.0 - 15.0  g/dL    Comment: REPEATED TO VERIFY THIS CRITICAL RESULT HAS VERIFIED AND BEEN CALLED TO VYSKOCIL,S. RN BY KATHLEEN COHEN ON 07 14 2022 AT 1919, AND HAS BEEN READ BACK. CRITICAL RESULT VERIFIED    HCT 16.3 (L) 36.0 - 46.0 %   MCV 85.3 80.0 - 100.0 fL   MCH 27.2 26.0 - 34.0 pg   MCHC 31.9 30.0 - 36.0 g/dL   RDW 19.2 (H) 11.5 - 15.5 %   Platelets 158 150 - 400 K/uL   nRBC 0.0 0.0 - 0.2 %   Neutrophils Relative % 90 %   Neutro Abs 18.9 (H) 1.7 - 7.7 K/uL   Lymphocytes Relative 4 %   Lymphs Abs 0.9 0.7 - 4.0 K/uL   Monocytes Relative 5 %   Monocytes Absolute 1.2 (H) 0.1 - 1.0 K/uL   Eosinophils Relative 0 %   Eosinophils Absolute 0.0 0.0 - 0.5 K/uL   Basophils Relative 0 %   Basophils Absolute 0.0 0.0 - 0.1 K/uL   WBC Morphology MILD LEFT SHIFT (1-5% METAS, OCC MYELO, OCC BANDS)    Immature Granulocytes 1 %   Abs Immature  Granulocytes 0.23 (H) 0.00 - 0.07 K/uL    Comment: Performed at [facility], [place] [address], [place], [place] [address]  Comprehensive metabolic panel     Status: Abnormal   Collection Time: 06/17/21  6:50 PM  Result Value Ref Range   Sodium 137 135 - 145 mmol/L   Potassium 4.4 3.5 - 5.1 mmol/L   Chloride 97 (L) 98 - 111 mmol/L   CO2 24 22 - 32 mmol/L   Glucose, Bld 101 (H) 70 - 99 mg/dL    Comment: Glucose reference range applies only to samples taken after fasting for at least 8 hours.   BUN 15 6 - 20 mg/dL   Creatinine, Ser 1.17 (H) 0.44 - 1.00 mg/dL   Calcium 7.9 (L) 8.9 - 10.3 mg/dL   Total Protein 5.8 (L) 6.5 - 8.1 g/dL   Albumin 2.2 (L) 3.5 - 5.0 g/dL   AST 52 (H) 15 - 41 U/L   ALT 20 0 - 44 U/L   Alkaline Phosphatase 192 (H) 38 - 126 U/L   Total Bilirubin 0.5 0.3 - 1.2 mg/dL   GFR, Estimated 58 (L) >60 mL/min    Comment: (NOTE) Calculated using the CKD-EPI Creatinine Equation (2021)    Anion gap 16 (H) 5 - 15    Comment: Performed at [facility], [place] [address], [place], [place] [address]  Prepare RBC (crossmatch)     Status: None   Collection Time: 06/17/21  7:33 PM  Result Value Ref Range   Order Confirmation      ORDER PROCESSED BY BLOOD BANK Performed at [facility], [place] [address] [place], [place] [address]   Type and screen [place]     Status: None (Preliminary result)   Collection Time: 06/17/21  8:09 PM  Result Value Ref Range   ABO/RH(D) O POS    Antibody Screen NEG    Sample Expiration 06/20/2021,2359    Unit Number [id number]    Blood Component Type RED CELLS,LR    Unit division 00    Status of Unit ISSUED    Transfusion Status OK TO TRANSFUSE    Crossmatch Result      Compatible Performed at [facility], [place] [address] [place],  [address]    Unit Number [id number]  Blood Component Type RED CELLS,LR    Unit division 00    Status  of Unit ISSUED    Transfusion Status OK TO TRANSFUSE    Crossmatch Result Compatible   Resp Panel by RT-PCR (Flu A&B, Covid) Nasopharyngeal Swab     Status: None   Collection Time: 06/17/21  9:47 PM   Specimen: Nasopharyngeal Swab; Nasopharyngeal(NP) swabs in vial transport medium  Result Value Ref Range   SARS Coronavirus 2 by RT PCR NEGATIVE NEGATIVE    Comment: (NOTE) SARS-CoV-2 target nucleic acids are NOT DETECTED.  The SARS-CoV-2 RNA is generally detectable in upper respiratory specimens during the acute phase of infection. The lowest concentration of SARS-CoV-2 viral copies this assay can detect is 138 copies/mL. A negative result does not preclude SARS-Cov-2 infection and should not be used as the sole basis for treatment or other patient management decisions. A negative result may occur with  improper specimen collection/handling, submission of specimen other than nasopharyngeal swab, presence of viral mutation(s) within the areas targeted by this assay, and inadequate number of viral copies(<138 copies/mL). A negative result must be combined with clinical observations, patient history, and epidemiological information. The expected result is Negative.  Fact Sheet for Patients:  EntrepreneurPulse.com.au  Fact Sheet for Healthcare Providers:  IncredibleEmployment.be  This test is no t yet approved or cleared by the [place] FDA and  has been authorized for detection and/or diagnosis of SARS-CoV-2 by FDA under an Emergency Use Authorization (EUA). This EUA will remain  in effect (meaning this test can be used) for the duration of the COVID-19 declaration under Section 564(b)(1) of the Act, 21 U.S.C.section 360bbb-3(b)(1), unless the authorization is terminated  or revoked sooner.       Influenza A by PCR NEGATIVE NEGATIVE   Influenza B by PCR NEGATIVE NEGATIVE    Comment: (NOTE) The Xpert Xpress SARS-CoV-2/FLU/RSV plus assay is  intended as an aid in the diagnosis of influenza from Nasopharyngeal swab specimens and should not be used as a sole basis for treatment. Nasal washings and aspirates are unacceptable for Xpert Xpress SARS-CoV-2/FLU/RSV testing.  Fact Sheet for Patients: EntrepreneurPulse.com.au  Fact Sheet for Healthcare Providers: IncredibleEmployment.be  This test is not yet approved or cleared by the [place] FDA and has been authorized for detection and/or diagnosis of SARS-CoV-2 by FDA under an Emergency Use Authorization (EUA). This EUA will remain in effect (meaning this test can be used) for the duration of the COVID-19 declaration under Section 564(b)(1) of the Act, 21 U.S.C. section 360bbb-3(b)(1), unless the authorization is terminated or revoked.  Performed at [facility], [place] [address], [place], [place] [address]   Culture, blood (routine x 2)     Status: None (Preliminary result)   Collection Time: 06/18/21 12:22 AM   Specimen: BLOOD  Result Value Ref Range   Specimen Description      BLOOD LEFT ANTECUBITAL Performed at [facility], [place] [address], [place], [place] [address]    Special Requests      BOTTLES DRAWN AEROBIC ONLY Blood Culture adequate volume Performed at [place] [address] [place], [place] [address]    Culture      NO GROWTH 1 DAY Performed at [place] [address], [place], [place] [address]    Report Status PENDING   Culture, blood (routine x 2)     Status: None (Preliminary result)   Collection Time: 06/18/21 12:22 AM   Specimen: BLOOD  Result Value Ref Range   Specimen Description      BLOOD BLOOD LEFT HAND Performed at [place] [address], [place], [place] [address]    Special Requests      BOTTLES DRAWN AEROBIC ONLY Blood Culture adequate volume Performed at [place]  [address], [place], [place] [address]    Culture      NO GROWTH 1 DAY Performed at [place] [address] [place], [place] [address]    Report Status PENDING   Prepare RBC (crossmatch)     Status: None   Collection Time: 06/18/21  6:47 AM  Result Value Ref Range   Order Confirmation      ORDER PROCESSED BY BLOOD BANK Performed at [facility], [place] [address] [place], [place] [address]   CBC WITH DIFFERENTIAL     Status: Abnormal   Collection Time: 06/18/21  8:09 AM  Result Value Ref Range   WBC 15.3 (H) 4.0 - 10.5 K/uL   RBC 2.18 (L) 3.87 - 5.11 MIL/uL   Hemoglobin 6.4 (LL) 12.0 - 15.0 g/dL   HCT 18.6 (L) 36.0 - 46.0 %   MCV 85.3 80.0 - 100.0 fL   MCH 29.4 26.0 - 34.0 pg   MCHC 34.4 30.0 - 36.0 g/dL   RDW 17.2 (H) 11.5 - 15.5 %   Platelets 130 (L) 150 - 400 K/uL   nRBC 0.0 0.0 - 0.2 %   Neutrophils Relative % 83 %   Neutro Abs 12.7 (H) 1.7 - 7.7 K/uL   Lymphocytes Relative 7 %   Lymphs Abs 1.1 0.7 - 4.0 K/uL   Monocytes Relative 9 %   Monocytes Absolute 1.4 (H) 0.1 - 1.0 K/uL   Eosinophils Relative 0 %   Eosinophils Absolute 0.0 0.0 - 0.5 K/uL   Basophils Relative 0 %   Basophils Absolute 0.0 0.0 - 0.1 K/uL   Immature Granulocytes 1 %   Abs Immature Granulocytes 0.18 (H) 0.00 - 0.07 K/uL   Tear Drop Cells PRESENT    Polychromasia PRESENT    Target Cells PRESENT     Comment: Performed at [facility], [place] [address], [place], [place] [address]  Protime-INR     Status: Abnormal   Collection Time: 06/18/21  8:09 AM  Result Value Ref Range   Prothrombin Time 18.2 (H) 11.4 - 15.2 seconds   INR 1.5 (H) 0.8 - 1.2    Comment: (NOTE) INR goal varies based on device and disease states. Performed at [facility], [place] [address], [place], [place] [address]   APTT     Status: None   Collection Time: 06/18/21  8:09 AM  Result Value Ref Range   aPTT 33 24 - 36 seconds    Comment: Performed at [facility], [place] [address], [place], [place] [address]  Comprehensive metabolic panel     Status: Abnormal   Collection Time: 06/18/21  8:09 AM  Result Value Ref Range   Sodium 136 135 - 145 mmol/L   Potassium 4.5 3.5 - 5.1 mmol/L   Chloride 99 98 - 111 mmol/L   CO2 28 22 - 32 mmol/L   Glucose, Bld 85 70 - 99 mg/dL    Comment: Glucose reference range applies only to samples taken after fasting for at least 8 hours.   BUN 16 6 - 20 mg/dL   Creatinine, Ser 1.05 (H) 0.44 - 1.00 mg/dL  Calcium 7.3 (L) 8.9 - 10.3 mg/dL   Total Protein 5.2 (L) 6.5 - 8.1 g/dL   Albumin 2.0 (L) 3.5 - 5.0 g/dL   AST 75 (H) 15 - 41 U/L   ALT 20 0 - 44 U/L   Alkaline Phosphatase 166 (H) 38 - 126 U/L   Total Bilirubin 0.6 0.3 - 1.2 mg/dL   GFR, Estimated >60 >60 mL/min    Comment: (NOTE) Calculated using the CKD-EPI Creatinine Equation (2021)    Anion gap 9 5 - 15    Comment: Performed at [facility], [place] [address], [place], [place] [address]  Magnesium     Status: Abnormal   Collection Time: 06/18/21  8:09 AM  Result Value Ref Range   Magnesium 1.1 (L) 1.7 - 2.4 mg/dL    Comment: Performed at [facility], [place] [address], [place], [place] [address]  CBC     Status: Abnormal   Collection Time: 06/18/21  3:23 PM  Result Value Ref Range   WBC 20.3 (H) 4.0 - 10.5 K/uL   RBC 3.02 (L) 3.87 - 5.11 MIL/uL   Hemoglobin 8.5 (L) 12.0 - 15.0 g/dL   HCT 25.6 (L) 36.0 - 46.0 %   MCV 84.8 80.0 - 100.0 fL   MCH 28.1 26.0 - 34.0 pg   MCHC 33.2 30.0 - 36.0 g/dL   RDW 18.7 (H) 11.5 - 15.5 %   Platelets 150 150 - 400 K/uL   nRBC 0.0 0.0 - 0.2 %    Comment: Performed at [facility], [place] [address], [place], [place] [address]  CBC     Status: Abnormal   Collection Time: 06/19/21  3:15 AM  Result Value Ref Range   WBC 16.7 (H) 4.0 - 10.5 K/uL   RBC 2.88 (L) 3.87 - 5.11 MIL/uL   Hemoglobin 8.0 (L) 12.0 - 15.0 g/dL   HCT 24.6 (L) 36.0 - 46.0 %   MCV  85.4 80.0 - 100.0 fL   MCH 27.8 26.0 - 34.0 pg   MCHC 32.5 30.0 - 36.0 g/dL   RDW 19.0 (H) 11.5 - 15.5 %   Platelets 134 (L) 150 - 400 K/uL   nRBC 0.0 0.0 - 0.2 %    Comment: Performed at [facility], [place] [address], [place], [place] [address]    No results found.  ROS MEDICAL CHART REVIEWED AND NOTES REVIEWED Blood pressure 118/79, pulse (!) 111, temperature 97.8 F (36.6 C), temperature source Oral, resp. rate 12, height 5\' 1"  (1.549 m), weight 68 kg, SpO2 100 %. Physical Exam  LEFT HAND:PT WITH FLUCTUANT AREA OVER VOLAR SMALL FINGER, PAIN WITH PALPATION AND MOTION OF SMALL FINGER NO WOUNDS TO LONG/RING/INDEX AND THUMB Assessment/Plan: LEFT SMALL FINGER ABSCESS AND HAND INFECTION  RECOMMEND INCISION AND DRAINAGE LEFT HAND PT WITH INFECTION RECOMMEND DOING NOW IN OPERATING ROOM PT AGREES TO PROCEED OR CONTACTED FOR SURGERY NOW FOR INFECTION IN DEEP SPACE OF HAND AND FINGER IN IMMUNOCOMPROMISED PATIENT  R/B/A DISCUSSED WITH PT IN HOSPITAL  PT VOICED UNDERSTANDING OF PLAN CONSENT SIGNED DAY OF SURGERY PT SEEN AND EXAMINED PRIOR TO OPERATIVE PROCEDURE/DAY OF SURGERY SITE MARKED. QUESTIONS ANSWERED WILL REMAIN AND INPATIENT FOLLOWING SURGERY   Linna Hoff 06/19/2021, 5:24 PM

## 2021-06-19 NOTE — Consult Note (Signed)
Consultation Note Date: 06/19/2021   Patient Name: Rebecca [name]  DOB: 03-31-1976  MRN: [id number]  Age / Sex: 45 y.o., female  PCP: Dorena Dew, FNP Referring Physician: Antonieta Pert, MD  Reason for Consultation: Establishing goals of care  HPI/Patient Profile: 45 y.o. female  with past medical history of metastatic breast cancer diagnosed in 2017 admitted on 06/17/2021 with generalized weakness and chest wall pain.  Plan was for admission to hospice after seeing Dr. [name] on 7/14 but she got worse and presented to ED before this can be arranged.  Also noted to have cellulitis of her hand on the left.  Palliative consulted for goals of care.  Clinical Assessment and Goals of Care: I saw and examined Rebecca [name]. We discussed clinical course as well as wishes moving forward in regard to advanced directives.  Concepts specific to code status and rehospitalization discussed.  We discussed difference between a aggressive medical intervention path and a palliative, comfort focused care path.  Values and goals of care important to patient and family were attempted to be elicited.   Concept of Hospice and Palliative Care were discussed   Questions and concerns addressed.   PMT will continue to support holistically.  SUMMARY OF RECOMMENDATIONS   -Full code: Discussed today but she did not really engage in conversation.  This may be something that is easier for her to discuss and make changes when she is back home with hospice support. -Discussed in detail with hospice liaison.  Plan for home with hospice.  She can be admitted on Sunday.  We discussed plan to continue antibiotics and do everything else possible to maximize her clinical status prior to discharging on Sunday morning.  Code Status/Advance Care Planning: Full code  Palliative Prophylaxis:  Bowel Regimen and Frequent Pain  Assessment  Additional Recommendations (Limitations, Scope, Preferences): Avoid Hospitalization  Psycho-social/Spiritual:  Desire for further Chaplaincy support: Not addressed Additional Recommendations: Education on Hospice  Prognosis:  < 6 months  Discharge Planning: Home with Hospice      Primary Diagnoses: Present on Admission:  Symptomatic anemia  GERD without esophagitis  Cellulitis of finger of left hand  Anasarca  Severe anemia   I have reviewed the medical record, interviewed the patient and family, and examined the patient. The following aspects are pertinent.  Past Medical History:  Diagnosis Date   Anxiety    Breast cancer ([place])    Depression    GERD (gastroesophageal reflux disease)    History of radiation therapy 11/15/16-01/12/17   right chest wall and axilla treated to 45 Gy in 25 fractions, boosted and additional 14 Gy in 8 fractions   Hypertension    diet controlled   Obesity (BMI 35.0-39.9 without comorbidity)    Pneumonia    as a child   Seasonal allergies    Sickle cell trait ([place])    Termination of pregnancy (fetus) 04/02/16   Social History   Socioeconomic History   Marital status: Single    Spouse name: Not on file  Number of children: 4   Years of education: Not on file   Highest education level: Not on file  Occupational History   Occupation: Private Care Attendant    Comment: [employer]  Tobacco Use   Smoking status: Former    Packs/day: 1.00    Years: 20.00    Pack years: 20.00    Types: Cigarettes    Quit date: 04/01/2018    Years since quitting: 3.2   Smokeless tobacco: Never   Tobacco comments:    Patient has quit smoking x 1 year now  Vaping Use   Vaping Use: Never used  Substance and Sexual Activity   Alcohol use: Yes    Comment: occ   Drug use: No   Sexual activity: Not on file  Other Topics Concern   Not on file  Social History Narrative   Not on file   Social Determinants of Health   Financial  Resource Strain: Not on file  Food Insecurity: Not on file  Transportation Needs: Not on file  Physical Activity: Not on file  Stress: Not on file  Social Connections: Not on file   Family History  Problem Relation Age of Onset   Hypertension Mother    Cancer Mother        dx "intestinal cancer" in her 73s; +surgery   Other Mother        hysterectomy at young age for unspecified cause   Heart Problems Mother    Breast cancer Cousin        maternal 1st cousin dx female breast cancer at 26-46y   Cancer Father    Hypertension Father    Heart Problems Maternal Aunt    Diabetes Maternal Aunt    Breast cancer Maternal Uncle        dx 64-65   Heart Problems Maternal Uncle    Breast cancer Maternal Grandmother 50   Throat cancer Maternal Grandfather        d. 50s; smoker   Sickle cell anemia Paternal Aunt    Congestive Heart Failure Maternal Aunt    Multiple sclerosis Cousin    Cancer Other        maternal great uncle (MGM's brother); cancer removed from his side   Heart attack Paternal Aunt        d. early 19s   Scheduled Meds:  Chlorhexidine Gluconate Cloth  6 each Topical Daily   gabapentin  300 mg Oral TID   mouth rinse  15 mL Mouth Rinse BID   methadone  5 mg Oral Q8H   Metronidazole (Flagyl) 500mg  tablet  1,000 mg Topical Daily   nutrition supplement (JUVEN)  1 packet Oral BID BM   pantoprazole  40 mg Oral Daily   polyethylene glycol  17 g Oral Daily   sodium chloride flush  10-40 mL Intracatheter Q12H   Continuous Infusions:  cefTRIAXone (ROCEPHIN)  IV 1 g (06/19/21 0041)   PRN Meds:.acetaminophen **OR** acetaminophen, alum & mag hydroxide-simeth, LORazepam, oxyCODONE **OR** morphine injection, ondansetron **OR** ondansetron (ZOFRAN) IV Medications Prior to Admission:  Prior to Admission medications   Medication Sig Start Date End Date Taking? Authorizing Provider  doxycycline (VIBRAMYCIN) 100 MG capsule Take 1 capsule (100 mg total) by mouth 2 (two) times daily.  06/17/21  Yes Arnaldo Natal, MD  gabapentin (NEURONTIN) 300 MG capsule Take 300 mg by mouth 3 (three) times daily. 03/04/21  Yes [provider]  LORazepam (ATIVAN) 0.5 MG tablet Take 1 tablet (0.5  mg total) by mouth daily as needed for anxiety. 06/17/21  Yes Arnaldo Natal, MD  methadone (DOLOPHINE) 5 MG tablet Take 1 tablet (5 mg total) by mouth every 8 (eight) hours. 06/17/21  Yes Arnaldo Natal, MD  naproxen sodium (ALEVE) 220 MG tablet Take 220 mg by mouth daily as needed (pain).   Yes [provider]  ondansetron (ZOFRAN ODT) 4 MG disintegrating tablet Take 1 tablet (4 mg total) by mouth every 8 (eight) hours as needed for nausea or vomiting. 06/17/21  Yes Arnaldo Natal, MD  oxyCODONE (OXY IR/ROXICODONE) 5 MG immediate release tablet Take 1 tablet (5 mg total) by mouth 2 (two) times daily as needed. Patient taking differently: Take 5 mg by mouth 2 (two) times daily as needed for severe pain. 06/17/21  Yes Arnaldo Natal, MD  polyethylene glycol (MIRALAX / GLYCOLAX) 17 g packet Take 17 g by mouth daily. Patient taking differently: Take 17 g by mouth daily as needed for moderate constipation. 08/18/20  Yes Lavina Hamman, MD   No Known Allergies Review of Systems  Constitutional:  Positive for activity change and fatigue.  Musculoskeletal:  Positive for joint swelling and myalgias.  Psychiatric/Behavioral:  Positive for sleep disturbance.    Physical Exam General: Sleepy but awakens easily HEENT: No bruits, no goiter, no JVD Heart: Tachycardic. No murmur appreciated. Lungs: Good air movement, clear Abdomen: Soft, nontender, nondistended, positive bowel sounds.   Ext: No significant edema Skin: Warm and dry Neuro: Grossly intact, nonfocal.  Vital Signs: BP 114/86 (BP Location: Left Arm)   Pulse (!) 109   Temp 98.8 F (37.1 C)   Resp 16   Ht 5\' 1"  (1.549 m)   Wt 68 kg   SpO2 98%   BMI 28.34 kg/m  Pain Scale: 0-10   Pain Score: 5    SpO2: SpO2: 98 % O2  Device:SpO2: 98 % O2 Flow Rate: .O2 Flow Rate (L/min): 2 L/min  IO: Intake/output summary:  Intake/Output Summary (Last 24 hours) at 06/19/2021 0900 Last data filed at 06/18/2021 1500 Gross per 24 hour  Intake 343.44 ml  Output --  Net 343.44 ml    LBM:   Baseline Weight: Weight: 68 kg Most recent weight: Weight:  (pt refused)     Palliative Assessment/Data:   Flowsheet Rows    Flowsheet Row Most Recent Value  Intake Tab   Referral Department Hospitalist  Unit at Time of Referral Med/Surg Unit  Palliative Care Primary Diagnosis Cancer  Date Notified 06/17/21  Palliative Care Type New Palliative care  Reason for referral Clarify Goals of Care  Date of Admission 06/17/21  Date first seen by Palliative Care 06/18/21  # of days Palliative referral response time 1 Day(s)  # of days IP prior to Palliative referral 0  Clinical Assessment   Palliative Performance Scale Score 40%  Psychosocial & Spiritual Assessment   Palliative Care Outcomes   Patient/Family meeting held? Yes  Who was at the meeting? Patient       Time In: 1710 Time Out: 1750  Time Total: 40 Greater than 50%  of this time was spent counseling and coordinating care related to the above assessment and plan.  Signed by: Micheline Rough, MD   Please contact Palliative Medicine Team phone at [phone] for questions and concerns.  For individual provider: See Shea Evans

## 2021-06-19 NOTE — Transfer of Care (Signed)
Immediate Anesthesia Transfer of Care Note  Patient: Rebecca [name]  Procedure(s) Performed: IRRIGATION AND DEBRIDEMENT EXTREMITY (Left: Hand)  Patient Location: PACU  Anesthesia Type:General  Level of Consciousness: drowsy and patient cooperative  Airway & Oxygen Therapy: Patient Spontanous Breathing and Patient connected to face mask oxygen  Post-op Assessment: Report given to RN and Post -op Vital signs reviewed and stable  Post vital signs: Reviewed and stable  Last Vitals:  Vitals Value Taken Time  BP 124/99 06/19/21 1915  Temp    Pulse 129 06/19/21 1914  Resp 16 06/19/21 1918  SpO2 99 % 06/19/21 1914  Vitals shown include unvalidated device data.  Last Pain:  Vitals:   06/19/21 1359  TempSrc: Oral  PainSc:       Patients Stated Pain Goal: 4 (93/81/82 9937)  Complications: No notable events documented.

## 2021-06-19 NOTE — Progress Notes (Signed)
PROGRESS NOTE    Rebecca [name]  QIW:[id number] DOB: 1976-01-27 DOA: 06/17/2021 PCP: Dorena Dew, FNP   Chief Complaint  Patient presents with   Nausea   Bleeding/Bruising    Bleeding from tumor from breast cancer   Brief Narrative: 45 year old female with metastatic breast cancer diagnosed 2017, with mets to bone and chest wall GERD presented to the ED with generalized weakness, chest wall pain. As per the report patient was getting her cancer treatment under the care of [facility] in [place] and has suggested all treatment modalities and was referred for home hospice about 2 weeks ago .  She was referred for hospice with [facility] after patient contacted Dr. [name] office on 7/14 Patient complains of progressively worsening generalized weakness, also having bleeding from her wounds in the right chest wall and left axilla. Seen in the ED and found to have severe anemia and admitted. She was also found to have cellulitis on the index finger of the left hand  Subjective: No new complaints.  Complains of pain and swelling on her left finger wondering if it can be drained  Hemoglobin has improved WC count improving  Assessment & Plan:  Symptomatic anemia Severe anemia Anemia of chronic disease/cancer as is chronic blood loss from breast cancer Received 2 units PRBC hemoglobin improved at 8.  Monitor CBC  Recent Labs  Lab 06/17/21 1850 06/18/21 0809 06/18/21 1523 06/19/21 0315  HGB 5.2* 6.4* 8.5* 8.0*  HCT 16.3* 18.6* 25.6* 24.6*     Leukocytosis Cellulitis of finger of the left hand Continue ceftriaxone and doxycycline, will discuss with Dr. Caralyn Guile to see if it can be drained message sent to the office.   Leukocytosis improving.    GERD-on ppi Mild elevated ERD:EYCXKGY Thrombocytopenia in the setting of metastatic disease.  Stable Recent Labs  Lab 06/17/21 1850 06/18/21 0809 06/18/21 1523 06/19/21 0315  PLT 158 130* 150 134*     Hypomagnesemia-repleted  Malignant neoplasm of upper-outer quadrant of right breast in female, estrogen receptor negative metastatic disease: Poor prognosis appears patient has referred for home with hospice after she exhausted all chemotherapeutic options.  She will be admitted to home with hospice on Sunday.  Continue wound care.  Goals of care, counseling/discussion: Unfortunately full code.  Palliative care consulted.  Anasarca: Peripheral edema with severe hypoalbuminemia anemia likely from poor intake, in the setting of metastatic cancer, supportive measures  Extensive right chest wall wound as well as left axillary wound Wound care has addressed the wound.   Diet Order             Diet regular Room service appropriate? Yes; Fluid consistency: Thin  Diet effective now                   Patient's Body mass index is 28.34 kg/m.  DVT prophylaxis: SCDs Start: 06/17/21 2101 Code Status:   Code Status: Full Code  Family Communication: plan of care discussed with patient and her daughter at bedside. Status is: Observation Admit observation status remains hospitalized for ongoing blood transfusion and further management  Dispo: The patient is from: Home              Anticipated d/c is to:  Home with hospice  Sunday              Patient currently is not medically stable to d/c.   Difficult to place patient No Unresulted Labs (From admission, onward)     Start  Ordered   06/17/21 2121  Culture, blood (routine x 2)  BLOOD CULTURE X 2,   R (with STAT occurrences)     Comments: for severe disease only    06/17/21 2121           Medications reviewed: Scheduled Meds:  Chlorhexidine Gluconate Cloth  6 each Topical Daily   doxycycline  100 mg Oral Q12H   gabapentin  300 mg Oral TID   mouth rinse  15 mL Mouth Rinse BID   methadone  5 mg Oral Q8H   Metronidazole (Flagyl) 500mg  tablet  1,000 mg Topical Daily   nutrition supplement (JUVEN)  1 packet Oral BID BM    pantoprazole  40 mg Oral Daily   polyethylene glycol  17 g Oral Daily   sodium chloride flush  10-40 mL Intracatheter Q12H   Continuous Infusions:  cefTRIAXone (ROCEPHIN)  IV 1 g (06/19/21 0041)   Consultants:see note  Procedures:see note Antimicrobials: Anti-infectives (From admission, onward)    Start     Dose/Rate Route Frequency Ordered Stop   06/19/21 1100  doxycycline (VIBRA-TABS) tablet 100 mg        100 mg Oral Every 12 hours 06/19/21 1013     06/18/21 1400  metroNIDAZOLE (FLAGYL) tablet 500 mg  Status:  Discontinued        500 mg Oral Daily 06/18/21 1310 06/18/21 1323   06/17/21 2200  cefTRIAXone (ROCEPHIN) 1 g in sodium chloride 0.9 % 100 mL IVPB        1 g 200 mL/hr over 30 Minutes Intravenous Every 24 hours 06/17/21 2121     06/17/21 1745  doxycycline (VIBRAMYCIN) 100 mg in sodium chloride 0.9 % 250 mL IVPB        100 mg 125 mL/hr over 120 Minutes Intravenous  Once 06/17/21 1739 06/17/21 2052   06/17/21 0000  doxycycline (VIBRAMYCIN) 100 MG capsule        100 mg Oral 2 times daily 06/17/21 1747        Culture/Microbiology    Component Value Date/Time   SDES BLOOD PORTA CATH 08/14/2020 1450   SPECREQUEST  08/14/2020 1450    BOTTLES DRAWN AEROBIC AND ANAEROBIC Blood Culture adequate volume   CULT  08/14/2020 1450    NO GROWTH 5 DAYS Performed at [facility], [place] [address], [place], [place] [address]    REPTSTATUS 08/19/2020 FINAL 08/14/2020 1450    Other culture-see note  Objective: Vitals: Today's Vitals   06/19/21 0726 06/19/21 0828 06/19/21 1010 06/19/21 1105  BP: 114/86   118/78  Pulse: (!) 109   (!) 102  Resp: 16   10  Temp: 98.8 F (37.1 C)   98.2 F (36.8 C)  TempSrc:    Oral  SpO2: 98%   100%  Weight:      Height:      PainSc:  Asleep Asleep     Intake/Output Summary (Last 24 hours) at 06/19/2021 1120 Last data filed at 06/18/2021 1500 Gross per 24 hour  Intake 343.44 ml  Output --  Net 343.44 ml    Filed Weights   06/17/21  1724  Weight: 68 kg   Weight change:   Intake/Output from previous day: 07/15 0701 - 07/16 0700 In: 571.5 [I.V.:228; Blood:315; IV Piggyback:28.4] Out: -  Intake/Output this shift: No intake/output data recorded. Filed Weights   06/17/21 1724  Weight: 68 kg   Examination: General exam: AAOx3, frail weak,older than stated age, weak appearing. HEENT:Oral mucosa moist, Ear/Nose  WNL grossly, dentition normal. Respiratory system: bilaterally diminished, , no use of accessory muscle Cardiovascular system: S1 & S2 +, No JVD,. Gastrointestinal system: Abdomen soft, NT,ND, BS+ Nervous System:Alert, awake, moving extremities and grossly nonfocal Extremities: no edema, distal peripheral pulses palpable.  Left fifth finger is swollen tender  Skin: No rashes,no icterus.  Right chest wall/breast area with dressing in place. MSK: Normal muscle bulk,tone, power  Data Reviewed: I have personally reviewed following labs and imaging studies CBC: Recent Labs  Lab 06/17/21 1850 06/18/21 0809 06/18/21 1523 06/19/21 0315  WBC 21.2* 15.3* 20.3* 16.7*  NEUTROABS 18.9* 12.7*  --   --   HGB 5.2* 6.4* 8.5* 8.0*  HCT 16.3* 18.6* 25.6* 24.6*  MCV 85.3 85.3 84.8 85.4  PLT 158 130* 150 134*    Basic Metabolic Panel: Recent Labs  Lab 06/17/21 1850 06/18/21 0809  NA 137 136  K 4.4 4.5  CL 97* 99  CO2 24 28  GLUCOSE 101* 85  BUN 15 16  CREATININE 1.17* 1.05*  CALCIUM 7.9* 7.3*  MG  --  1.1*    GFR: Estimated Creatinine Clearance: 58.5 mL/min (A) (by C-G formula based on SCr of 1.05 mg/dL (H)). Liver Function Tests: Recent Labs  Lab 06/17/21 1850 06/18/21 0809  AST 52* 75*  ALT 20 20  ALKPHOS 192* 166*  BILITOT 0.5 0.6  PROT 5.8* 5.2*  ALBUMIN 2.2* 2.0*    No results for input(s): LIPASE, AMYLASE in the last 168 hours. No results for input(s): AMMONIA in the last 168 hours. Coagulation Profile: Recent Labs  Lab 06/18/21 0809  INR 1.5*    Cardiac Enzymes: No results for  input(s): CKTOTAL, CKMB, CKMBINDEX, TROPONINI in the last 168 hours. BNP (last 3 results) No results for input(s): PROBNP in the last 8760 hours. HbA1C: No results for input(s): HGBA1C in the last 72 hours. CBG: No results for input(s): GLUCAP in the last 168 hours. Lipid Profile: No results for input(s): CHOL, HDL, LDLCALC, TRIG, CHOLHDL, LDLDIRECT in the last 72 hours. Thyroid Function Tests: No results for input(s): TSH, T4TOTAL, FREET4, T3FREE, THYROIDAB in the last 72 hours. Anemia Panel: No results for input(s): VITAMINB12, FOLATE, FERRITIN, TIBC, IRON, RETICCTPCT in the last 72 hours. Sepsis Labs: No results for input(s): PROCALCITON, LATICACIDVEN in the last 168 hours.  Recent Results (from the past 240 hour(s))  Resp Panel by RT-PCR (Flu A&B, Covid) Nasopharyngeal Swab     Status: None   Collection Time: 06/17/21  9:47 PM   Specimen: Nasopharyngeal Swab; Nasopharyngeal(NP) swabs in vial transport medium  Result Value Ref Range Status   SARS Coronavirus 2 by RT PCR NEGATIVE NEGATIVE Final    Comment: (NOTE) SARS-CoV-2 target nucleic acids are NOT DETECTED.  The SARS-CoV-2 RNA is generally detectable in upper respiratory specimens during the acute phase of infection. The lowest concentration of SARS-CoV-2 viral copies this assay can detect is 138 copies/mL. A negative result does not preclude SARS-Cov-2 infection and should not be used as the sole basis for treatment or other patient management decisions. A negative result may occur with  improper specimen collection/handling, submission of specimen other than nasopharyngeal swab, presence of viral mutation(s) within the areas targeted by this assay, and inadequate number of viral copies(<138 copies/mL). A negative result must be combined with clinical observations, patient history, and epidemiological information. The expected result is Negative.  Fact Sheet for Patients:   EntrepreneurPulse.com.au  Fact Sheet for Healthcare Providers:  IncredibleEmployment.be  This test is no t yet approved  or cleared by the [place] and  has been authorized for detection and/or diagnosis of SARS-CoV-2 by FDA under an Emergency Use Authorization (EUA). This EUA will remain  in effect (meaning this test can be used) for the duration of the COVID-19 declaration under Section 564(b)(1) of the Act, 21 U.S.C.section 360bbb-3(b)(1), unless the authorization is terminated  or revoked sooner.       Influenza A by PCR NEGATIVE NEGATIVE Final   Influenza B by PCR NEGATIVE NEGATIVE Final    Comment: (NOTE) The Xpert Xpress SARS-CoV-2/FLU/RSV plus assay is intended as an aid in the diagnosis of influenza from Nasopharyngeal swab specimens and should not be used as a sole basis for treatment. Nasal washings and aspirates are unacceptable for Xpert Xpress SARS-CoV-2/FLU/RSV testing.  Fact Sheet for Patients: EntrepreneurPulse.com.au  Fact Sheet for Healthcare Providers: IncredibleEmployment.be  This test is not yet approved or cleared by the [place] FDA and has been authorized for detection and/or diagnosis of SARS-CoV-2 by FDA under an Emergency Use Authorization (EUA). This EUA will remain in effect (meaning this test can be used) for the duration of the COVID-19 declaration under Section 564(b)(1) of the Act, 21 U.S.C. section 360bbb-3(b)(1), unless the authorization is terminated or revoked.  Performed at [facility], [place] [address] [place],  [address]      Radiology Studies: No results found.   LOS: 1 day   Antonieta Pert, MD [facility]  06/19/2021, 11:20 AM

## 2021-06-19 NOTE — Progress Notes (Signed)
Received patient from [place] room 1429. Patient put on 2 liters nasal canula. Telemetry monitoring resumed. Vital signs taken. Received report from [place], [place].

## 2021-06-19 NOTE — Progress Notes (Signed)
Initial Nutrition Assessment  DOCUMENTATION CODES:   Not applicable  INTERVENTION:   Juven Fruit Punch BID, each serving provides 95kcal and 2.5g of protein (amino acids glutamine and arginine) 30 ml ProSource Plus BID, each supplement provides 100 kcals and 15 grams protein.   NUTRITION DIAGNOSIS:   Increased nutrient needs related to cancer and cancer related treatments as evidenced by estimated needs.  GOAL:   Patient will meet greater than or equal to 90% of their needs  MONITOR:   PO intake, Supplement acceptance, Weight trends, Labs, I & O's  REASON FOR ASSESSMENT:   Consult Assessment of nutrition requirement/status  ASSESSMENT:   Patient with PMH significant for metastatic breast cancer to bone lymph nodes chest wall, GERD, and HTN. Presents this admission with symptomatic anemia 2/2 to ongoing intermittent bleeding of R chest and L axilla.  Patient reports appetite was off/on PTA. States she typically eats two meals daily that consist of B- eggs with grain, sometimes skips lunch, and D- protein source with grain. Appetite this admission stable. Last meal completion charted as 100%. Discussed the importance of protein intake for preservation of lean body mass. Patient does not like Ensure or Boost,  is requesting to have Catharine. RD encouraged high protein meal intake as Juven is typically recommended when kcal/protein needs are met.   Patient endorses a UBW of 200 lb and an unknown amount of weight loss. Records indicate patient weighed 84.8 kg on 10/22/20 and show a stated weight of 68 kg this admission. Will need to obtain actual weight to assess for weight loss. Suspect malnutrition but need NFPE and weight to confirm.   Medications: miralax Labs: Mg 1.1 (L)   Diet Order:   Diet Order             Diet regular Room service appropriate? Yes; Fluid consistency: Thin  Diet effective now                   EDUCATION NEEDS:   Not appropriate for education at  this time  Skin:  Skin Assessment: Reviewed RN Assessment  Last BM:  PTA  Height:   Ht Readings from Last 1 Encounters:  06/17/21 _0  (1.549 m)    Weight:   Wt Readings from Last 1 Encounters:  06/17/21 68 kg    BMI:  Body mass index is 28.34 kg/m.  Estimated Nutritional Needs:   Kcal:  1800-2000 kcal  Protein:  90-105 grams  Fluid:  >/= 1.8 L/day  Mariana Single MS, RD, LDN, CNSC Clinical Nutrition Pager listed in [place]

## 2021-06-19 NOTE — Anesthesia Procedure Notes (Signed)
Procedure Name: Intubation Date/Time: 06/19/2021 6:37 PM Performed by: Montel Clock, CRNA Pre-anesthesia Checklist: Patient identified, Emergency Drugs available, Suction available, Patient being monitored and Timeout performed Patient Re-evaluated:Patient Re-evaluated prior to induction Oxygen Delivery Method: Circle system utilized Preoxygenation: Pre-oxygenation with 100% oxygen Induction Type: IV induction and Rapid sequence Laryngoscope Size: Mac and 3 Grade View: Grade I Tube type: Oral Tube size: 7.0 mm Number of attempts: 1 Airway Equipment and Method: Stylet Placement Confirmation: ETT inserted through vocal cords under direct vision, positive ETCO2 and breath sounds checked- equal and bilateral Secured at: 21 cm Tube secured with: Tape Dental Injury: Teeth and Oropharynx as per pre-operative assessment

## 2021-06-19 NOTE — Progress Notes (Signed)
End of Shift: Patient alert and awake sitting on bed at shift change. Daughter at bedside with patient. Reported off to [employer], Therapist, sports.

## 2021-06-20 ENCOUNTER — Encounter (HOSPITAL_COMMUNITY): Payer: Self-pay | Admitting: Orthopedic Surgery

## 2021-06-20 DIAGNOSIS — C7951 Secondary malignant neoplasm of bone: Secondary | ICD-10-CM | POA: Diagnosis not present

## 2021-06-20 DIAGNOSIS — L03012 Cellulitis of left finger: Secondary | ICD-10-CM | POA: Diagnosis not present

## 2021-06-20 DIAGNOSIS — D630 Anemia in neoplastic disease: Secondary | ICD-10-CM | POA: Diagnosis not present

## 2021-06-20 DIAGNOSIS — C50411 Malignant neoplasm of upper-outer quadrant of right female breast: Secondary | ICD-10-CM | POA: Diagnosis not present

## 2021-06-20 DIAGNOSIS — Z515 Encounter for palliative care: Secondary | ICD-10-CM | POA: Diagnosis not present

## 2021-06-20 DIAGNOSIS — I10 Essential (primary) hypertension: Secondary | ICD-10-CM | POA: Diagnosis not present

## 2021-06-20 DIAGNOSIS — D500 Iron deficiency anemia secondary to blood loss (chronic): Secondary | ICD-10-CM | POA: Diagnosis not present

## 2021-06-20 DIAGNOSIS — D649 Anemia, unspecified: Secondary | ICD-10-CM | POA: Diagnosis not present

## 2021-06-20 DIAGNOSIS — F32A Depression, unspecified: Secondary | ICD-10-CM | POA: Diagnosis not present

## 2021-06-20 DIAGNOSIS — Z7189 Other specified counseling: Secondary | ICD-10-CM | POA: Diagnosis not present

## 2021-06-20 DIAGNOSIS — Z20822 Contact with and (suspected) exposure to covid-19: Secondary | ICD-10-CM | POA: Diagnosis not present

## 2021-06-20 DIAGNOSIS — D6959 Other secondary thrombocytopenia: Secondary | ICD-10-CM | POA: Diagnosis not present

## 2021-06-20 DIAGNOSIS — L02512 Cutaneous abscess of left hand: Secondary | ICD-10-CM | POA: Diagnosis not present

## 2021-06-20 DIAGNOSIS — C773 Secondary and unspecified malignant neoplasm of axilla and upper limb lymph nodes: Secondary | ICD-10-CM | POA: Diagnosis not present

## 2021-06-20 DIAGNOSIS — E8809 Other disorders of plasma-protein metabolism, not elsewhere classified: Secondary | ICD-10-CM | POA: Diagnosis not present

## 2021-06-20 LAB — BASIC METABOLIC PANEL
Anion gap: 8 (ref 5–15)
BUN: 10 mg/dL (ref 6–20)
CO2: 25 mmol/L (ref 22–32)
Calcium: 7.4 mg/dL — ABNORMAL LOW (ref 8.9–10.3)
Chloride: 105 mmol/L (ref 98–111)
Creatinine, Ser: 0.79 mg/dL (ref 0.44–1.00)
GFR, Estimated: >60 mL/min (ref >60)
Glucose, Bld: 105 mg/dL — ABNORMAL HIGH (ref 70–99)
Potassium: 3.2 mmol/L — ABNORMAL LOW (ref 3.5–5.1)
Sodium: 138 mmol/L (ref 135–145)

## 2021-06-20 LAB — CBC
HCT: 19.7 % — ABNORMAL LOW (ref 36.0–46.0)
Hemoglobin: 6.3 g/dL — CL (ref 12.0–15.0)
MCH: 28.1 pg (ref 26.0–34.0)
MCHC: 32 g/dL (ref 30.0–36.0)
MCV: 87.9 fL (ref 80.0–100.0)
Platelets: 135 10*3/uL — ABNORMAL LOW (ref 150–400)
RBC: 2.24 MIL/uL — ABNORMAL LOW (ref 3.87–5.11)
RDW: 19.3 % — ABNORMAL HIGH (ref 11.5–15.5)
WBC: 14.1 10*3/uL — ABNORMAL HIGH (ref 4.0–10.5)
nRBC: 0 % (ref 0.0–0.2)

## 2021-06-20 LAB — PROTIME-INR
INR: 1.6 — ABNORMAL HIGH (ref 0.8–1.2)
Prothrombin Time: 19.2 seconds — ABNORMAL HIGH (ref 11.4–15.2)

## 2021-06-20 LAB — PREPARE RBC (CROSSMATCH)

## 2021-06-20 MED ORDER — GELATIN ABSORBABLE 12-7 MM EX MISC
1.0000 | CUTANEOUS | Status: DC | PRN
  Filled 2021-06-20: qty 1

## 2021-06-20 MED ORDER — SODIUM CHLORIDE 0.9% IV SOLUTION: Freq: Once | INTRAVENOUS | Status: DC

## 2021-06-20 MED ORDER — SODIUM CHLORIDE 0.9 % IV BOLUS
500.0000 mL | Freq: Once | INTRAVENOUS | Status: AC
  Administered 2021-06-20: 500 mL via INTRAVENOUS

## 2021-06-20 MED ORDER — SODIUM CHLORIDE 0.9 % IV BOLUS
1000.0000 mL | Freq: Once | INTRAVENOUS | Status: AC
  Administered 2021-06-20: 1000 mL via INTRAVENOUS

## 2021-06-20 MED ORDER — POTASSIUM CHLORIDE CRYS ER 20 MEQ PO TBCR
40.0000 meq | EXTENDED_RELEASE_TABLET | Freq: Once | ORAL | Status: AC
  Administered 2021-06-20: 40 meq via ORAL
  Filled 2021-06-20: qty 2

## 2021-06-20 NOTE — Consult Note (Signed)
General Surgery [facility]  Reason for Consult: active bleeding right chest wall  Referring Physician: Dr. Antonieta Pert, [facility]  Rebecca [name] is an 45 y.o. female.  HPI: Patient is a 45 year old female with locally advanced carcinoma of the breast.  I received an emergent consultation from the medical attending due to active arterial bleeding from the tumor.  I came to the bedside as soon as possible.  I had advised direct pressure with 4 x 4 gauze sponges.  By the time I arrived, bleeding was controlled and there was no further active bleeding.  We discussed not using any type of prophylactic anticoagulation.  Her platelet count is low normal.  Her prothrombin time is elevated as is her INR at 1.5.  She may benefit from plasma.  Patient refuses dressing change by the nurses.  She states that she will do the dressing change herself.  I will recommend using nonadhesive dressing such as Xeroform gauze.  Past Medical History:  Diagnosis Date   Anxiety    Breast cancer ([place])    Depression    GERD (gastroesophageal reflux disease)    History of radiation therapy 11/15/16-01/12/17   right chest wall and axilla treated to 45 Gy in 25 fractions, boosted and additional 14 Gy in 8 fractions   Hypertension    diet controlled   Obesity (BMI 35.0-39.9 without comorbidity)    Pneumonia    as a child   Seasonal allergies    Sickle cell trait ([place])    Termination of pregnancy (fetus) 04/02/16    Past Surgical History:  Procedure Laterality Date   CESAREAN SECTION     2004 and 2007   I & D EXTREMITY Left 06/19/2021   Procedure: IRRIGATION AND DEBRIDEMENT EXTREMITY;  Surgeon: Iran Planas, MD;  Location: [facility];  Service: Orthopedics;  Laterality: Left;   MASTECTOMY W/ SENTINEL NODE BIOPSY Right 09/06/2016   Procedure: RIGHT BREAST MASTECTOMY WITH RIGHT AXILLARY SENTINEL LYMPH NODE BIOPSY;  Surgeon: Alphonsa Overall, MD;  Location: [facility];   Service: General;  Laterality: Right;   PORT-A-CATH REMOVAL Left 09/06/2016   Procedure: REMOVAL PORT-A-CATH;  Surgeon: Alphonsa Overall, MD;  Location: [facility];  Service: General;  Laterality: Left;   PORTACATH PLACEMENT     PORTACATH PLACEMENT N/A 07/09/2019   Procedure: INSERTION PORT-A-CATH WITH ULTRASOUND;  Surgeon: Alphonsa Overall, MD;  Location: [facility];  Service: General;  Laterality: N/A;    Family History  Problem Relation Age of Onset   Hypertension Mother    Cancer Mother        dx "intestinal cancer" in her 43s; +surgery   Other Mother        hysterectomy at young age for unspecified cause   Heart Problems Mother    Breast cancer Cousin        maternal 1st cousin dx female breast cancer at 36-46y   Cancer Father    Hypertension Father    Heart Problems Maternal Aunt    Diabetes Maternal Aunt    Breast cancer Maternal Uncle        dx 64-65   Heart Problems Maternal Uncle    Breast cancer Maternal Grandmother 50   Throat cancer Maternal Grandfather        d. 58s; smoker   Sickle cell anemia Paternal Aunt    Congestive Heart Failure Maternal Aunt    Multiple sclerosis Cousin    Cancer Other  maternal great uncle (MGM's brother); cancer removed from his side   Heart attack Paternal Aunt        d. early 24s    Social History:  reports that she quit smoking about 3 years ago. Her smoking use included cigarettes. She has a 20.00 pack-year smoking history. She has never used smokeless tobacco. She reports current alcohol use. She reports that she does not use drugs.  Allergies: No Known Allergies  Medications: I have reviewed the patient's current medications.  Results for orders placed or performed during the hospital encounter of 06/17/21 (from the past 48 hour(s))  CBC     Status: Abnormal   Collection Time: 06/18/21  3:23 PM  Result Value Ref Range   WBC 20.3 (H) 4.0 - 10.5 K/uL   RBC 3.02 (L) 3.87 - 5.11 MIL/uL   Hemoglobin 8.5 (L) 12.0 - 15.0  g/dL   HCT 25.6 (L) 36.0 - 46.0 %   MCV 84.8 80.0 - 100.0 fL   MCH 28.1 26.0 - 34.0 pg   MCHC 33.2 30.0 - 36.0 g/dL   RDW 18.7 (H) 11.5 - 15.5 %   Platelets 150 150 - 400 K/uL   nRBC 0.0 0.0 - 0.2 %    Comment: Performed at [facility] [place] [address] [place], [place] [address]  CBC     Status: Abnormal   Collection Time: 06/19/21  3:15 AM  Result Value Ref Range   WBC 16.7 (H) 4.0 - 10.5 K/uL   RBC 2.88 (L) 3.87 - 5.11 MIL/uL   Hemoglobin 8.0 (L) 12.0 - 15.0 g/dL   HCT 24.6 (L) 36.0 - 46.0 %   MCV 85.4 80.0 - 100.0 fL   MCH 27.8 26.0 - 34.0 pg   MCHC 32.5 30.0 - 36.0 g/dL   RDW 19.0 (H) 11.5 - 15.5 %   Platelets 134 (L) 150 - 400 K/uL   nRBC 0.0 0.0 - 0.2 %    Comment: Performed at [facility], [place] [address], [place], [place] [address]  Surgical pcr screen     Status: None   Collection Time: 06/19/21  6:08 PM   Specimen: Nasal Mucosa; Nasal Swab  Result Value Ref Range   MRSA, PCR NEGATIVE NEGATIVE   Staphylococcus aureus NEGATIVE NEGATIVE    Comment: (NOTE) The Xpert SA Assay (FDA approved for NASAL specimens in patients 54 years of age and older), is one component of a comprehensive surveillance program. It is not intended to diagnose infection nor to guide or monitor treatment. Performed at [facility], [place] [address] [place], [place] [address]   Prepare RBC (crossmatch)     Status: None   Collection Time: 06/20/21  2:34 PM  Result Value Ref Range   Order Confirmation      ORDER PROCESSED BY BLOOD BANK Performed at [facility], [place] [address] [place], [place] [address]     No results found.  Review of Systems  Hematological:  Bruises/bleeds easily.  All other systems reviewed and are negative.  Physical Exam  Blood pressure 119/79, pulse (!) 137, temperature 97.8 F (36.6 C), temperature source Oral, resp. rate 14, height 5\' 1"  (1.549 m), weight 68 kg, SpO2 100  %.  CONSTITUTIONAL: no acute distress; conversant; patient unclothed; large open wound right chest wall; fresh bandages left hand  EYES: conjunctiva moist; no lid lag; anicteric; pupils equal bilaterally  NECK: trachea midline; no thyroid nodularity  CHEST: Large open wound encompassing  most of the anterior right chest wall.  Gross tumor circumferentially.  Chest wall exposed with visible pleural surface at base of wound.  Area of bleeding at left inferior medial portion of the wound is controlled after direct pressure.  Removal of gauze shows no further bleeding.  PSYCH: appropriate affect for situation; alert and oriented to person, place, & time  LYMPHATIC: no palpable cervical lymphadenopathy; no evidence lymphedema in extremities    Assessment/Plan:  Locally advanced right breast carcinoma with open wound and acute hemorrhage  Recommend correcting mild coagulopathy.  Recommend holding all forms of anticoagulation including Lovenox, heparin, and aspirin.  Recommend using nonadherent dressing such as Xeroform.  Agree with plans for consultation with palliative care.  Will discussed with pharmacy and emergency room the possibility of placing hemostatic dressing packs at the bedside for possible future use in an emergency.  General surgery will check on the patient tomorrow morning.  Call if needed.  Armandina Gemma, MD [facility] Office: [place] 06/20/2021, 3:17 PM

## 2021-06-20 NOTE — Progress Notes (Signed)
PROGRESS NOTE    Rebecca [name]  ION:[id number] DOB: [birth year] DOA: 06/17/2021 PCP: Dorena Dew, FNP   Chief Complaint  Patient presents with   Nausea   Bleeding/Bruising    Bleeding from tumor from breast cancer   Brief Narrative: 45 year old female with metastatic breast cancer diagnosed 2017, with mets to bone and chest wall GERD presented to the ED with generalized weakness, chest wall pain. As per the report patient was getting her cancer treatment under the care of [facility] in [place] and has suggested all treatment modalities and was referred for home hospice about 2 weeks ago .  She was referred for hospice with [facility] after patient contacted Dr. [name] office on 7/14 Patient complains of progressively worsening generalized weakness, also having bleeding from her wounds in the right chest wall and left axilla. Seen in the ED and found to have severe anemia and admitted. She was also found to have cellulitis abscess on the index finger of the left hand-underwent or an I&D 7/16. Repeated episode of bleeding on 7/17- tachycardic, hb donw to 6.3, give bolus iv and 2 unit prbc.CCS saw patient. Radiation oncologist was consulted for radiation of the breast to prevent bleeding.  Underwent radiation treatment 7/20, 7/21.  Subjective: Seen this morning patient on the bedside chair,daughter at the bedside Remains intermittently tachycardic  Assessment & Plan:  Symptomatic severe anemia Anemia of chronic disease/cancer  and anemia from chronic blood loss from breast cancer Total 5 units PRBC, hb has improved to 9.4 g.seen by surgery oncology radiation oncology and started palliative radiotherapy for bleeding issues underwent 1 session and more on 7/21, 7/22, 7/25, 7/26.  Plan is for patient to be admitted to home with hospice tomorrow and can be discharged tomorrow.  She will continue on radiation therapy today.  Recent Labs  Lab 06/19/21 0315  06/20/21 1450 06/21/21 0339 06/23/21 0840 06/24/21 1045  HGB 8.0* 6.3* 9.0* 7.6* 11.4*  HCT 24.6* 19.7* 27.6* 23.5* 34.6*   Extensive right chest wall wound as well as left axillary wound Wound care to continue as she allows.  Reinforced patient t to allow nursing for dressing change.  Malignant neoplasm of upper-outer quadrant of right breast in female, estrogen receptor negative metastatic disease: Poor prognosis appears patient has referred for home with hospice after she exhausted all chemotherapeutic options.  Radiation oncology has seen the patient and going for total 5 radiation treatment  until Tuesday   MRSA Cellulitis/abscess of fifth finger on left hand: s/p "Left small finger flexor sheath incision and drainage tendon sheath. Left small finger and hand drainage of complicated abscess deep space abscess".  Continue oral doxycycline, dressing changes.  Sinus tachycardia suspect multifactorial due to metastatic cancer, anemia.Anemia has improved with transfusion.Heart rate overall intermittently high continue metoprolol as blood pressure tolerates.   She refused tele.  Leucocytosis:in the setting of cellulitis/malignancy.  On antibiotics GERD-on ppi. Mild elevated KGM:WNUUVOZ. Thrombocytopenia in the setting of metastatic disease.  Hypomagnesemia-repleted Hypokalemia repleted.  Anasarca: Peripheral edema with severe hypoalbuminemia anemia likely from poor intake, in the setting of metastatic cancer, supportive measures. Goals of care, counseling/discussion: Had a frank discussion and she agreed for no chest compression but wants rest of the aggressive care including intubation.  Palliative care following closely.  Plan is for home with hospice once getting radiation therapy  Diet Order             Diet general  Diet regular Room service appropriate? Yes; Fluid consistency: Thin  Diet effective now                   Patient's Body mass index is 28.34  kg/m.  DVT prophylaxis: SCD's Start: 06/19/21 2029 SCDs Start: 06/17/21 2101 Code Status:   Code Status: Partial Code  Family Communication: plan of care discussed with patient and her daughter at bedside. Status is: Inpatient Remains inpatient for ongoing management of bleeding from the breast cancer and anemia  Dispo: The patient is from: Home              Anticipated d/c is to:  Home with hospice   Saturday am.              Patient currently is not medically stable to d/c.   Difficult to place patient No   Medications reviewed: Scheduled Meds:  (feeding supplement) PROSource Plus  30 mL Oral BID BM   sodium chloride   Intravenous Once   sodium chloride   Intravenous Once   Chlorhexidine Gluconate Cloth  6 each Topical Daily   doxycycline  100 mg Oral Q12H   gabapentin  300 mg Oral TID   mouth rinse  15 mL Mouth Rinse BID   methadone  5 mg Oral Q8H   metoprolol tartrate  12.5 mg Oral BID   Metronidazole (Flagyl) 500mg  tablet  1,000 mg Topical Daily   nutrition supplement (JUVEN)  1 packet Oral BID BM   pantoprazole  40 mg Oral Daily   polyethylene glycol  17 g Oral Daily   senna-docusate  1 tablet Oral BID   sodium chloride flush  10-40 mL Intracatheter Q12H   Continuous Infusions:   Consultants:see note  Procedures:see note Antimicrobials: Anti-infectives (From admission, onward)    Start     Dose/Rate Route Frequency Ordered Stop   06/24/21 0000  metroNIDAZOLE (FLAGYL) 500 MG tablet           06/24/21 1356     06/24/21 0000  doxycycline (VIBRAMYCIN) 100 MG capsule        100 mg Oral 2 times daily 06/24/21 1356 07/01/21 2359   06/19/21 1100  doxycycline (VIBRA-TABS) tablet 100 mg        100 mg Oral Every 12 hours 06/19/21 1013     06/18/21 1400  metroNIDAZOLE (FLAGYL) tablet 500 mg  Status:  Discontinued        500 mg Oral Daily 06/18/21 1310 06/18/21 1323   06/17/21 2200  cefTRIAXone (ROCEPHIN) 1 g in sodium chloride 0.9 % 100 mL IVPB  Status:  Discontinued         1 g 200 mL/hr over 30 Minutes Intravenous Every 24 hours 06/17/21 2121 06/22/21 1011   06/17/21 1745  doxycycline (VIBRAMYCIN) 100 mg in sodium chloride 0.9 % 250 mL IVPB        100 mg 125 mL/hr over 120 Minutes Intravenous  Once 06/17/21 1739 06/17/21 2052   06/17/21 0000  doxycycline (VIBRAMYCIN) 100 MG capsule  Status:  Discontinued        100 mg Oral 2 times daily 06/17/21 1747 06/24/21       Culture/Microbiology    Component Value Date/Time   SDES  06/19/2021 1853    ABSCESS Performed at [facility], [place] [address], [place], [place] [address]    SPECREQUEST  06/19/2021 1853    LEFT HAND Performed at [facility], [place] [address] [place], [place] [address]  CULT  06/19/2021 1853    FEW METHICILLIN RESISTANT STAPHYLOCOCCUS AUREUS NO ANAEROBES ISOLATED; CULTURE IN PROGRESS FOR 5 DAYS    REPTSTATUS PENDING 06/19/2021 1853    Other culture-see note  Objective: Vitals: Today's Vitals   06/25/21 0002 06/25/21 0324 06/25/21 0331 06/25/21 0354  BP:   111/72   Pulse:   (!) 107   Resp:   18   Temp:   (!) 97 F (36.1 C)   TempSrc:   Oral   SpO2:   100%   Weight:      Height:      PainSc: Asleep 9   Asleep    Intake/Output Summary (Last 24 hours) at 06/25/2021 0752 Last data filed at 06/25/2021 0500 Gross per 24 hour  Intake 550 ml  Output 1200 ml  Net -650 ml   Filed Weights   06/17/21 1724  Weight: 68 kg   Weight change:   Intake/Output from previous day: 07/21 0701 - 07/22 0700 In: 550 [P.O.:540; I.V.:10] Out: 1200 [Urine:1200] Intake/Output this shift: No intake/output data recorded. Filed Weights   06/17/21 1724  Weight: 68 kg   Examination: General exam: AAOx 3.pleasant HEENT:Oral mucosa moist, Ear/Nose WNL grossly, dentition normal. Respiratory system: bilaterally diminished,  no use of accessory muscle Cardiovascular system: S1 & S2 +, No JVD,. Gastrointestinal system: Abdomen soft,NT,ND, BS+ Nervous  System:Alert, awake, moving extremities and grossly nonfocal Extremities: no edema, distal peripheral pulses palpable.  Skin: No rashes,no icterus. MSK: Normal muscle bulk,tone, power  Chest wall fluid extension breast mass at edge and tissue loss in middle with visible ext ribs?/deep chest wall tissue Data Reviewed: I have personally reviewed following labs and imaging studies CBC: Recent Labs  Lab 06/18/21 0809 06/18/21 1523 06/19/21 0315 06/20/21 1450 06/21/21 0339 06/23/21 0840 06/24/21 1045  WBC 15.3* 20.3* 16.7* 14.1*  --   --   --   NEUTROABS 12.7*  --   --   --   --   --   --   HGB 6.4* 8.5* 8.0* 6.3* 9.0* 7.6* 11.4*  HCT 18.6* 25.6* 24.6* 19.7* 27.6* 23.5* 34.6*  MCV 85.3 84.8 85.4 87.9  --   --   --   PLT 130* 150 134* 135*  --   --   --    Basic Metabolic Panel: Recent Labs  Lab 06/18/21 0809 06/20/21 1450  NA 136 138  K 4.5 3.2*  CL 99 105  CO2 28 25  GLUCOSE 85 105*  BUN 16 10  CREATININE 1.05* 0.79  CALCIUM 7.3* 7.4*  MG 1.1*  --    GFR: Estimated Creatinine Clearance: 76.7 mL/min (by C-G formula based on SCr of 0.79 mg/dL). Liver Function Tests: Recent Labs  Lab 06/18/21 0809  AST 75*  ALT 20  ALKPHOS 166*  BILITOT 0.6  PROT 5.2*  ALBUMIN 2.0*   No results for input(s): LIPASE, AMYLASE in the last 168 hours. No results for input(s): AMMONIA in the last 168 hours. Coagulation Profile: Recent Labs  Lab 06/18/21 0809 06/20/21 1450  INR 1.5* 1.6*   Cardiac Enzymes: No results for input(s): CKTOTAL, CKMB, CKMBINDEX, TROPONINI in the last 168 hours. BNP (last 3 results) No results for input(s): PROBNP in the last 8760 hours. HbA1C: No results for input(s): HGBA1C in the last 72 hours. CBG: No results for input(s): GLUCAP in the last 168 hours. Lipid Profile: No results for input(s): CHOL, HDL, LDLCALC, TRIG, CHOLHDL, LDLDIRECT in the last 72 hours. Thyroid Function Tests: No results for  input(s): TSH, T4TOTAL, FREET4, T3FREE, THYROIDAB  in the last 72 hours. Anemia Panel: No results for input(s): VITAMINB12, FOLATE, FERRITIN, TIBC, IRON, RETICCTPCT in the last 72 hours. Sepsis Labs: No results for input(s): PROCALCITON, LATICACIDVEN in the last 168 hours.  Recent Results (from the past 240 hour(s))  Resp Panel by RT-PCR (Flu A&B, Covid) Nasopharyngeal Swab     Status: None   Collection Time: 06/17/21  9:47 PM   Specimen: Nasopharyngeal Swab; Nasopharyngeal(NP) swabs in vial transport medium  Result Value Ref Range Status   SARS Coronavirus 2 by RT PCR NEGATIVE NEGATIVE Final    Comment: (NOTE) SARS-CoV-2 target nucleic acids are NOT DETECTED.  The SARS-CoV-2 RNA is generally detectable in upper respiratory specimens during the acute phase of infection. The lowest concentration of SARS-CoV-2 viral copies this assay can detect is 138 copies/mL. A negative result does not preclude SARS-Cov-2 infection and should not be used as the sole basis for treatment or other patient management decisions. A negative result may occur with  improper specimen collection/handling, submission of specimen other than nasopharyngeal swab, presence of viral mutation(s) within the areas targeted by this assay, and inadequate number of viral copies(<138 copies/mL). A negative result must be combined with clinical observations, patient history, and epidemiological information. The expected result is Negative.  Fact Sheet for Patients:  EntrepreneurPulse.com.au  Fact Sheet for Healthcare Providers:  IncredibleEmployment.be  This test is no t yet approved or cleared by the [place] FDA and  has been authorized for detection and/or diagnosis of SARS-CoV-2 by FDA under an Emergency Use Authorization (EUA). This EUA will remain  in effect (meaning this test can be used) for the duration of the COVID-19 declaration under Section 564(b)(1) of the Act, 21 U.S.C.section 360bbb-3(b)(1), unless the  authorization is terminated  or revoked sooner.       Influenza A by PCR NEGATIVE NEGATIVE Final   Influenza B by PCR NEGATIVE NEGATIVE Final    Comment: (NOTE) The Xpert Xpress SARS-CoV-2/FLU/RSV plus assay is intended as an aid in the diagnosis of influenza from Nasopharyngeal swab specimens and should not be used as a sole basis for treatment. Nasal washings and aspirates are unacceptable for Xpert Xpress SARS-CoV-2/FLU/RSV testing.  Fact Sheet for Patients: EntrepreneurPulse.com.au  Fact Sheet for Healthcare Providers: IncredibleEmployment.be  This test is not yet approved or cleared by the [place] FDA and has been authorized for detection and/or diagnosis of SARS-CoV-2 by FDA under an Emergency Use Authorization (EUA). This EUA will remain in effect (meaning this test can be used) for the duration of the COVID-19 declaration under Section 564(b)(1) of the Act, 21 U.S.C. section 360bbb-3(b)(1), unless the authorization is terminated or revoked.  Performed at [facility], [place] [address] [place], [place] [address]   Culture, blood (routine x 2)     Status: None   Collection Time: 06/18/21 12:22 AM   Specimen: BLOOD  Result Value Ref Range Status   Specimen Description   Final    BLOOD LEFT ANTECUBITAL Performed at [place] [address], [place], [place] [address]    Special Requests   Final    BOTTLES DRAWN AEROBIC ONLY Blood Culture adequate volume Performed at [place] [address] [place], [place] [address]    Culture   Final    NO GROWTH 5 DAYS Performed at [facility], [place] [address] [place],  [address]    Report Status 06/23/2021 FINAL  Final  Culture, blood (routine x 2)     Status: None   Collection Time: 06/18/21 12:22 AM   Specimen: BLOOD  Result Value Ref Range Status   Specimen Description   Final    BLOOD BLOOD LEFT  HAND Performed at [place] [address] [place], [place] [address]    Special Requests   Final    BOTTLES DRAWN AEROBIC ONLY Blood Culture adequate volume Performed at [place] [address] [place], [place] [address]    Culture   Final    NO GROWTH 5 DAYS Performed at [facility], [place] [address], [place], [place] [address]    Report Status 06/23/2021 FINAL  Final  Surgical pcr screen     Status: None   Collection Time: 06/19/21  6:08 PM   Specimen: Nasal Mucosa; Nasal Swab  Result Value Ref Range Status   MRSA, PCR NEGATIVE NEGATIVE Final   Staphylococcus aureus NEGATIVE NEGATIVE Final    Comment: (NOTE) The Xpert SA Assay (FDA approved for NASAL specimens in patients 78 years of age and older), is one component of a comprehensive surveillance program. It is not intended to diagnose infection nor to guide or monitor treatment. Performed at [facility], [place] [address], [place], [place] [address]   Aerobic/Anaerobic Culture w Gram Stain (surgical/deep wound)     Status: None (Preliminary result)   Collection Time: 06/19/21  6:53 PM   Specimen: PATH Other; Tissue  Result Value Ref Range Status   Specimen Description   Final    ABSCESS Performed at [place] [address], [place], [place] [address]    Special Requests   Final    LEFT HAND Performed at [facility], [place] [address] [place], [place] [address]    Gram Stain   Final    MODERATE WBC PRESENT, PREDOMINANTLY PMN MODERATE GRAM POSITIVE COCCI IN CLUSTERS Performed at [facility], [place] [address] [place], [place] [address]    Culture   Final    FEW METHICILLIN RESISTANT STAPHYLOCOCCUS AUREUS NO ANAEROBES ISOLATED; CULTURE IN PROGRESS FOR 5 DAYS    Report Status PENDING  Incomplete   Organism ID, Bacteria METHICILLIN RESISTANT STAPHYLOCOCCUS AUREUS  Final      Susceptibility    Methicillin resistant staphylococcus aureus - MIC*    CIPROFLOXACIN <=0.5 SENSITIVE Sensitive     ERYTHROMYCIN >=8 RESISTANT Resistant     GENTAMICIN <=0.5 SENSITIVE Sensitive     OXACILLIN >=4 RESISTANT Resistant     TETRACYCLINE <=1 SENSITIVE Sensitive     VANCOMYCIN <=0.5 SENSITIVE Sensitive     TRIMETH/SULFA <=10 SENSITIVE Sensitive     CLINDAMYCIN <=0.25 SENSITIVE Sensitive     RIFAMPIN <=0.5 SENSITIVE Sensitive     Inducible Clindamycin NEGATIVE Sensitive     * FEW METHICILLIN RESISTANT STAPHYLOCOCCUS AUREUS     Radiology Studies: No results found.   LOS: 7 days   Antonieta Pert, MD [facility]  06/25/2021, 7:52 AM

## 2021-06-20 NOTE — Progress Notes (Signed)
General Surgery [facility]  I discussed the case with the pharmacist.  The dressing is called SurgiFoam and will be placed at the bedside with the nurse instructed to use in the event of an emergency.  Armandina Gemma, MD [facility] Office: [phone]

## 2021-06-20 NOTE — Progress Notes (Signed)
Pt refused dressing change on this shift. This RN attempted x3 to encourage pt to perform change, as well as offered to help. Pt voiced " I don't need any help, I will do it later." Pt educated on importance of keeping clean dressing. Will pass on to pm RN.

## 2021-06-20 NOTE — Progress Notes (Addendum)
PROGRESS NOTE    Rebecca [name]  CZY:[id number] DOB: Oct 10, 1976 DOA: 06/17/2021 PCP: Dorena Dew, FNP   Chief Complaint  Patient presents with   Nausea   Bleeding/Bruising    Bleeding from tumor from breast cancer   Brief Narrative: 45 year old female with metastatic breast cancer diagnosed 2017, with mets to bone and chest wall GERD presented to the ED with generalized weakness, chest wall pain. As per the report patient was getting her cancer treatment under the care of [facility] in [place] and has suggested all treatment modalities and was referred for home hospice about 2 weeks ago .  She was referred for hospice with [facility] after patient contacted Dr. [name] office on 7/14 Patient complains of progressively worsening generalized weakness, also having bleeding from her wounds in the right chest wall and left axilla. Seen in the ED and found to have severe anemia and admitted. She was also found to have cellulitis abscess on the index finger of the left hand-underwent or an I&D 7/16.  Subjective: No new complaints resting comfortably.  Left hand with dressing intact. Wants to stay here until tomorrow for dressing change  Assessment & Plan:  Symptomatic severe anemia Anemia of chronic disease/cancer  and anemia from chronic blood loss from breast cancer Received 2 units PRBC hemoglobin improved at 8.  Cellulitis/abscess of fifth finger on left hand: S/p "Left small finger flexor sheath incision and drainage tendon sheath. Left small finger and hand drainage of complicated abscess deep space abscess".  Continue dressing changes "beginning on Monday Adaptic dressings and dry dressings over the Adaptic" AND will keep her on oral antibiotics .she can follow-up with Dr. Caralyn Guile in a week  if she wants.  I discussed with Dr. Caralyn Guile  and he is okay w/ discharge and continue wound care at home hospice and can see him in clinic" patient wants to stay and  will do wound care tomorrow then home .  Leucocytosis:in the setting of cellulitis/malignancy GERD-on ppi Mild elevated UXN:ATFTDDU Thrombocytopenia in the setting of metastatic disease.  Hypomagnesemia-repleted Malignant neoplasm of upper-outer quadrant of right breast in female, estrogen receptor negative metastatic disease: Poor prognosis appears patient has referred for home with hospice after she exhausted all chemotherapeutic options.  She will be admitted to home with hospice on Sunday.  Continue wound care. Anasarca: Peripheral edema with severe hypoalbuminemia anemia likely from poor intake, in the setting of metastatic cancer, supportive measures. Extensive right chest wall wound as well as left axillary wound Wound care has addressed the wound. Goals of care, counseling/discussion: Unfortunately full code.  Palliative care consulted.  She is being planned for home with hospice    Addendum: Rapid response was called as patient had continuous bleeding oozing from her right breast patient was seen urgently, her heart rate initially was in 160s blood pressure 140s, EKG showed sinus tachycardia. Patient denies any chest pain.  Dressing removed direct pressure applied subsequently blood started.  Surgery was consulted-came and saw the patient at bedside-Surgifoam dressing was made available at the bedside in case of emergency with ongoing bleeding.  Patient given 1 L bolus and heart rate improving in 130s and also 1 unit PRBC started repeat H&H , may need more transfusion. PMT also seeing. Low threshold for transfer to stepdown if any hemodynamic instability or ongoing bleeding  Diet Order             Diet regular Room service appropriate? Yes; Fluid consistency: Thin  Diet effective  now                   Patient's Body mass index is 28.34 kg/m.  DVT prophylaxis: SCD's Start: 06/19/21 2029 SCDs Start: 06/17/21 2101 Code Status:   Code Status: Full Code  Family Communication:  plan of care discussed with patient and her daughter at bedside. Status is: Observation Admit observation status remains hospitalized for ongoing blood transfusion and further management  Dispo: The patient is from: Home              Anticipated d/c is to:  Home with hospice   tomorrow after dressing change.              Patient currently is not medically stable to d/c.   Difficult to place patient No   Medications reviewed: Scheduled Meds:  (feeding supplement) PROSource Plus  30 mL Oral BID BM   Chlorhexidine Gluconate Cloth  6 each Topical Daily   doxycycline  100 mg Oral Q12H   gabapentin  300 mg Oral TID   mouth rinse  15 mL Mouth Rinse BID   methadone  5 mg Oral Q8H   Metronidazole (Flagyl) 500mg  tablet  1,000 mg Topical Daily   nutrition supplement (JUVEN)  1 packet Oral BID BM   pantoprazole  40 mg Oral Daily   polyethylene glycol  17 g Oral Daily   sodium chloride flush  10-40 mL Intracatheter Q12H   Continuous Infusions:  cefTRIAXone (ROCEPHIN)  IV 1 g (06/19/21 2312)   Consultants:see note  Procedures:see note Antimicrobials: Anti-infectives (From admission, onward)    Start     Dose/Rate Route Frequency Ordered Stop   06/19/21 1100  doxycycline (VIBRA-TABS) tablet 100 mg        100 mg Oral Every 12 hours 06/19/21 1013     06/18/21 1400  metroNIDAZOLE (FLAGYL) tablet 500 mg  Status:  Discontinued        500 mg Oral Daily 06/18/21 1310 06/18/21 1323   06/17/21 2200  cefTRIAXone (ROCEPHIN) 1 g in sodium chloride 0.9 % 100 mL IVPB        1 g 200 mL/hr over 30 Minutes Intravenous Every 24 hours 06/17/21 2121     06/17/21 1745  doxycycline (VIBRAMYCIN) 100 mg in sodium chloride 0.9 % 250 mL IVPB        100 mg 125 mL/hr over 120 Minutes Intravenous  Once 06/17/21 1739 06/17/21 2052   06/17/21 0000  doxycycline (VIBRAMYCIN) 100 MG capsule        100 mg Oral 2 times daily 06/17/21 1747        Culture/Microbiology    Component Value Date/Time   SDES  06/18/2021  0022    BLOOD LEFT ANTECUBITAL Performed at [facility], [place] [address], [place], [place] [address]    SDES  06/18/2021 0022    BLOOD BLOOD LEFT HAND Performed at [facility] [address] [place], [place] [address]    SPECREQUEST  06/18/2021 0022    BOTTLES DRAWN AEROBIC ONLY Blood Culture adequate volume Performed at [place] [address] [place], [place] [address]    SPECREQUEST  06/18/2021 0022    BOTTLES DRAWN AEROBIC ONLY Blood Culture adequate volume Performed at [facility] [address], [place], [place] [address]    CULT  06/18/2021 0022    NO GROWTH 1 DAY Performed at [place] [address] [place], [place] [address]  CULT  06/18/2021 0022    NO GROWTH 1 DAY Performed at [facility], [place] [address] [place], [place] [address]    REPTSTATUS PENDING 06/18/2021 0022   REPTSTATUS PENDING 06/18/2021 0022    Other culture-see note  Objective: Vitals: Today's Vitals   06/19/21 2128 06/19/21 2222 06/20/21 0322 06/20/21 1015  BP:  124/82 119/68   Pulse:  100 (!) 108   Resp:  12 15   Temp:  99 F (37.2 C) 98.6 F (37 C)   TempSrc:   Oral   SpO2:  100% 100%   Weight:      Height:      PainSc: Asleep   8     Intake/Output Summary (Last 24 hours) at 06/20/2021 1145 Last data filed at 06/19/2021 1954 Gross per 24 hour  Intake 900 ml  Output 12 ml  Net 888 ml   Filed Weights   06/17/21 1724  Weight: 68 kg   Weight change:   Intake/Output from previous day: 07/16 0701 - 07/17 0700 In: 900 [I.V.:900] Out: 12 [Urine:1; Stool:1; Blood:10] Intake/Output this shift: No intake/output data recorded. Filed Weights   06/17/21 1724  Weight: 68 kg   Examination: General exam: AAOx 3, older than stated age, HEENT:Oral mucosa moist, Ear/Nose WNL grossly, dentition normal. Respiratory system: bilaterally diminished, CHEST WALL WITH WOUND breast cancer  /cancer bottom layer of chest was visible. Cardiovascular system: S1 & S2 +, No JVD,. Gastrointestinal system: Abdomen soft,NT,ND, BS+ Nervous System:Alert, awake, moving extremities and grossly nonfocal Extremities: Left hand with dressing in place.  Skin: No rashes,no icterus. MSK: Normal muscle bulk,tone, power.   Data Reviewed: I have personally reviewed following labs and imaging studies CBC: Recent Labs  Lab 06/17/21 1850 06/18/21 0809 06/18/21 1523 06/19/21 0315  WBC 21.2* 15.3* 20.3* 16.7*  NEUTROABS 18.9* 12.7*  --   --   HGB 5.2* 6.4* 8.5* 8.0*  HCT 16.3* 18.6* 25.6* 24.6*  MCV 85.3 85.3 84.8 85.4  PLT 158 130* 150 224*   Basic Metabolic Panel: Recent Labs  Lab 06/17/21 1850 06/18/21 0809  NA 137 136  K 4.4 4.5  CL 97* 99  CO2 24 28  GLUCOSE 101* 85  BUN 15 16  CREATININE 1.17* 1.05*  CALCIUM 7.9* 7.3*  MG  --  1.1*   GFR: Estimated Creatinine Clearance: 58.5 mL/min (A) (by C-G formula based on SCr of 1.05 mg/dL (H)). Liver Function Tests: Recent Labs  Lab 06/17/21 1850 06/18/21 0809  AST 52* 75*  ALT 20 20  ALKPHOS 192* 166*  BILITOT 0.5 0.6  PROT 5.8* 5.2*  ALBUMIN 2.2* 2.0*   No results for input(s): LIPASE, AMYLASE in the last 168 hours. No results for input(s): AMMONIA in the last 168 hours. Coagulation Profile: Recent Labs  Lab 06/18/21 0809  INR 1.5*   Cardiac Enzymes: No results for input(s): CKTOTAL, CKMB, CKMBINDEX, TROPONINI in the last 168 hours. BNP (last 3 results) No results for input(s): PROBNP in the last 8760 hours. HbA1C: No results for input(s): HGBA1C in the last 72 hours. CBG: No results for input(s): GLUCAP in the last 168 hours. Lipid Profile: No results for input(s): CHOL, HDL, LDLCALC, TRIG, CHOLHDL, LDLDIRECT in the last 72 hours. Thyroid Function Tests: No results for input(s): TSH, T4TOTAL, FREET4, T3FREE, THYROIDAB in the last 72 hours. Anemia Panel: No results for input(s): VITAMINB12, FOLATE, FERRITIN,  TIBC, IRON, RETICCTPCT in the last 72 hours. Sepsis Labs: No results for input(s): PROCALCITON, LATICACIDVEN in the  last 168 hours.  Recent Results (from the past 240 hour(s))  Resp Panel by RT-PCR (Flu A&B, Covid) Nasopharyngeal Swab     Status: None   Collection Time: 06/17/21  9:47 PM   Specimen: Nasopharyngeal Swab; Nasopharyngeal(NP) swabs in vial transport medium  Result Value Ref Range Status   SARS Coronavirus 2 by RT PCR NEGATIVE NEGATIVE Final    Comment: (NOTE) SARS-CoV-2 target nucleic acids are NOT DETECTED.  The SARS-CoV-2 RNA is generally detectable in upper respiratory specimens during the acute phase of infection. The lowest concentration of SARS-CoV-2 viral copies this assay can detect is 138 copies/mL. A negative result does not preclude SARS-Cov-2 infection and should not be used as the sole basis for treatment or other patient management decisions. A negative result may occur with  improper specimen collection/handling, submission of specimen other than nasopharyngeal swab, presence of viral mutation(s) within the areas targeted by this assay, and inadequate number of viral copies(<138 copies/mL). A negative result must be combined with clinical observations, patient history, and epidemiological information. The expected result is Negative.  Fact Sheet for Patients:  EntrepreneurPulse.com.au  Fact Sheet for Healthcare Providers:  IncredibleEmployment.be  This test is no t yet approved or cleared by the [place] FDA and  has been authorized for detection and/or diagnosis of SARS-CoV-2 by FDA under an Emergency Use Authorization (EUA). This EUA will remain  in effect (meaning this test can be used) for the duration of the COVID-19 declaration under Section 564(b)(1) of the Act, 21 U.S.C.section 360bbb-3(b)(1), unless the authorization is terminated  or revoked sooner.       Influenza A by PCR NEGATIVE NEGATIVE Final    Influenza B by PCR NEGATIVE NEGATIVE Final    Comment: (NOTE) The Xpert Xpress SARS-CoV-2/FLU/RSV plus assay is intended as an aid in the diagnosis of influenza from Nasopharyngeal swab specimens and should not be used as a sole basis for treatment. Nasal washings and aspirates are unacceptable for Xpert Xpress SARS-CoV-2/FLU/RSV testing.  Fact Sheet for Patients: EntrepreneurPulse.com.au  Fact Sheet for Healthcare Providers: IncredibleEmployment.be  This test is not yet approved or cleared by the [place] FDA and has been authorized for detection and/or diagnosis of SARS-CoV-2 by FDA under an Emergency Use Authorization (EUA). This EUA will remain in effect (meaning this test can be used) for the duration of the COVID-19 declaration under Section 564(b)(1) of the Act, 21 U.S.C. section 360bbb-3(b)(1), unless the authorization is terminated or revoked.  Performed at [facility], [place] [address] [place], [place] [address]   Culture, blood (routine x 2)     Status: None (Preliminary result)   Collection Time: 06/18/21 12:22 AM   Specimen: BLOOD  Result Value Ref Range Status   Specimen Description   Final    BLOOD LEFT ANTECUBITAL Performed at [facility] [address], [place], [place] [address]    Special Requests   Final    BOTTLES DRAWN AEROBIC ONLY Blood Culture adequate volume Performed at [facility] [address], [place], [place] [address]    Culture   Final    NO GROWTH 1 DAY Performed at [facility], [place] [address], [place],  [address]    Report Status PENDING  Incomplete  Culture, blood (routine x 2)     Status: None (Preliminary result)   Collection Time: 06/18/21 12:22 AM   Specimen: BLOOD  Result Value Ref Range Status   Specimen Description   Final  BLOOD BLOOD LEFT HAND Performed at [facility] [address], [place], [place] [address]    Special Requests   Final    BOTTLES DRAWN AEROBIC ONLY Blood Culture adequate volume Performed at [facility] [address], [place], [place] [address]    Culture   Final    NO GROWTH 1 DAY Performed at [facility], [place] [address] [place], [place] [address]    Report Status PENDING  Incomplete  Surgical pcr screen     Status: None   Collection Time: 06/19/21  6:08 PM   Specimen: Nasal Mucosa; Nasal Swab  Result Value Ref Range Status   MRSA, PCR NEGATIVE NEGATIVE Final   Staphylococcus aureus NEGATIVE NEGATIVE Final    Comment: (NOTE) The Xpert SA Assay (FDA approved for NASAL specimens in patients 23 years of age and older), is one component of a comprehensive surveillance program. It is not intended to diagnose infection nor to guide or monitor treatment. Performed at [facility], [place] [address], [place], [place] [address]      Radiology Studies: No results found.   LOS: 2 days   Antonieta Pert, MD [facility]  06/20/2021, 11:45 AM

## 2021-06-20 NOTE — Progress Notes (Signed)
Pt refusing dressing change again today, despite encouragement and education. As with yesterday, pt continues to state "I can do it myself, I don't need help. I will do it later." Will attempt again in the afternoon.

## 2021-06-20 NOTE — Progress Notes (Signed)
Daily Progress Note   Patient Name: Rebecca [name]       Date: 06/20/2021 DOB: Nov 03, 1976  Age: 45 y.o. MRN#: [id number] Attending Physician: Antonieta Pert, MD Primary Care Physician: Dorena Dew, FNP Admit Date: 06/17/2021  Reason for Consultation/Follow-up: Establishing goals of care  Subjective: I saw and examined Bernadett.  She was sitting in the bedside chair in no distress.  She remains sleepy, but she does awake and is able to converse appropriately.  She reports having significant pain still in her left pinky and asks about having it drained.  We discussed that Ortho has been consulted and hopefully they will be by to give recommendations later today.  Discussed plan to transition home with hospice support tomorrow.  Hopefully, Ortho can see her prior to this.  Length of Stay: 2  Current Medications: Scheduled Meds:  . (feeding supplement) PROSource Plus  30 mL Oral BID BM  . Chlorhexidine Gluconate Cloth  6 each Topical Daily  . doxycycline  100 mg Oral Q12H  . gabapentin  300 mg Oral TID  . mouth rinse  15 mL Mouth Rinse BID  . methadone  5 mg Oral Q8H  . Metronidazole (Flagyl) 500mg  tablet  1,000 mg Topical Daily  . nutrition supplement (JUVEN)  1 packet Oral BID BM  . pantoprazole  40 mg Oral Daily  . polyethylene glycol  17 g Oral Daily  . sodium chloride flush  10-40 mL Intracatheter Q12H    Continuous Infusions: . cefTRIAXone (ROCEPHIN)  IV 1 g (06/19/21 2312)    PRN Meds: acetaminophen **OR** acetaminophen, alum & mag hydroxide-simeth, LORazepam, oxyCODONE **OR** morphine injection, ondansetron **OR** ondansetron (ZOFRAN) IV  Physical Exam         General: Sleepy but awakens easily HEENT: No bruits, no goiter, no JVD Heart: Tachycardic. No murmur  appreciated. Lungs: Good air movement, clear Abdomen: Soft, nontender, nondistended, positive bowel sounds.   Ext: No significant edema Skin: Warm and dry, left hand swollen and discoloration at base of pinky Neuro: Grossly intact, nonfocal.  Vital Signs: BP 119/68 (BP Location: Left Leg)   Pulse (!) 108   Temp 98.6 F (37 C) (Oral)   Resp 15   Ht 5\' 1"  (1.549 m)   Wt 68 kg   SpO2 100%   BMI 28.34 kg/m  SpO2: SpO2: 100 % O2 Device: O2 Device: Nasal Cannula O2 Flow Rate: O2 Flow Rate (L/min): 2 L/min  Intake/output summary:  Intake/Output Summary (Last 24 hours) at 06/20/2021 0746 Last data filed at 06/19/2021 1954 Gross per 24 hour  Intake 900 ml  Output 12 ml  Net 888 ml   LBM:   Baseline Weight: Weight: 68 kg Most recent weight: Weight:  (pt refused)       Palliative Assessment/Data:    Flowsheet Rows    Flowsheet Row Most Recent Value  Intake Tab   Referral Department Hospitalist  Unit at Time of Referral Med/Surg Unit  Palliative Care Primary Diagnosis Cancer  Date Notified 06/17/21  Palliative Care Type New Palliative care  Reason for referral Clarify Goals of Care  Date of Admission 06/17/21  Date first seen by Palliative Care 06/18/21  # of days Palliative referral response time 1 Day(s)  # of days IP prior to Palliative referral 0  Clinical Assessment   Palliative Performance Scale Score 40%  Psychosocial & Spiritual Assessment   Palliative Care Outcomes   Patient/Family meeting held? Yes  Who was at the meeting? Patient       Patient Active Problem List   Diagnosis Date Noted  . Severe anemia 06/18/2021  . Symptomatic anemia 06/17/2021  . GERD without esophagitis 06/17/2021  . Cellulitis of finger of left hand 06/17/2021  . Anasarca 06/17/2021  . Neutropenic fever ([place]) 08/14/2020  . Sepsis ([place]) 08/14/2020  . Acute lower UTI 08/14/2020  . Open chest wound, right, initial encounter 08/14/2020  . Anemia associated with chemotherapy  08/14/2020  . Neuropathy due to chemotherapeutic drug ([place]) 07/10/2019  . Recurrent breast adenocarcinoma ([place]) 07/02/2019  . Bone metastases ([place]) 07/02/2019  . Goals of care, counseling/discussion 07/02/2019  . Morbid obesity with body mass index (BMI) of 40.0 to 44.9 in adult ([place]) 04/10/2018  . Genetic testing 11/20/2016  . Family history of breast cancer 11/20/2016  . Malignant neoplasm of upper-outer quadrant of right breast in female, estrogen receptor negative ([place]) 02/04/2016  . Hirsutism 01/13/2016  . Tobacco dependence 01/13/2016  . Irregular periods/menstrual cycles 01/13/2016    Palliative Care Assessment & Plan   Patient Profile: 45 y.o. female  with past medical history of metastatic breast cancer diagnosed in 2017 admitted on 06/17/2021 with generalized weakness and chest wall pain.  Plan was for admission to hospice after seeing Dr. [name] on 7/14 but she got worse and presented to ED before this can be arranged.  Also noted to have cellulitis of her hand on the left.  Palliative consulted for goals of care.  Recommendations/Plan: Full code/full scope: I believe this will be easier for her to further discuss and make changes when she is back home.  Hospice to continue to discuss at time of admission. Her hand remains a large source of discomfort for her.  Ortho eval is pending.  Hopefully this can be done today. Plan for transition home with hospice tomorrow.  Code Status:    Code Status Orders  (From admission, onward)           Start     Ordered   06/17/21 2101  Full code  Continuous        06/17/21 2103           Code Status History     Date Active Date Inactive Code Status Order ID Comments User Context   08/14/2020 1938 08/17/2020 2015 Full Code [id number]  Kathie Dike, MD  ED   09/06/2016 1430 09/07/2016 1211 Full Code [id number]  Alphonsa Overall, MD Inpatient   04/11/2016 1313 04/12/2016 0319 Full Code [id number]  Arne Cleveland, MD HOV        Prognosis:  < 6 months  Discharge Planning: Home with Hospice  Care plan was discussed with patient Thank you for allowing the Palliative Medicine Team to assist in the care of this patient.   Total Time 20 Prolonged Time Billed No      Greater than 50%  of this time was spent counseling and coordinating care related to the above assessment and plan.  Micheline Rough, MD  Please contact Palliative Medicine Team phone at [phone] for questions and concerns.

## 2021-06-20 NOTE — Progress Notes (Signed)
[facility] [address] [facility]) Hospital Liaison RN Note  Notified of plan for patient to discharge home 7.18.22 instead of today.   [facility] hospital liaison will follow up tomorrow and coordinate admission onto hospice services upon discharge from hospital.   Please do not hesitate to call with any hospice related questions.   Thank you,   Bobbie "Loren Racer, [place], BSN [facility] Liaison [phone]

## 2021-06-20 NOTE — Significant Event (Addendum)
Rapid Response Event Note   Reason for Call :  Hr 180s and Bleeding from Chest  Initial Focused Assessment:  Pt laying in bed with hands pressed to chest blood oozing from around fingers. HR 170s ST on monitor. BP 119/79. Pt alert and oriented.  Appears calm. Large amount of bleeding from tumor during dressing change.     Interventions:  Pressure applied to wound. MD at bedside. EKG obtained. 1L NS bolus given. 1 U PRBC ordered and transfusing. CCS at bedside for emergent consult, advised use of SurgiFoam in emergency.  Plan of Care:  Pt remains in 1429. Bleeding has been controlled from pressure. 1 U PRBC transfusing. Keeping 1 unit ahead. Advised bedside RN to call rapid response back if HR  increased again or if patient began bleeding again.   Event Summary:   MD Notified: Antonieta Pert, MD Call Time: [place] Time: 8978 End Time: [place], RN

## 2021-06-20 NOTE — Progress Notes (Signed)
This RN informed by CCMD of pt HR sustaining 150-170. When entering the room, patient was in recliner holding a blood soaked towel to her chest, with multiple saturated towels in the floor. CN, as well as MD and rapid response were notified. Pt was assisted back to bed, 2LNC applied, and pressure applied to area. MD to bedside and orders received for 1L NS bolus, as well as 1unit PRBC's. Bleeding controlled and CCS to bedside to evaluate. Pt stabilized and will continue to monitor closely.

## 2021-06-20 NOTE — Progress Notes (Signed)
Daily Progress Note   Patient Name: Rebecca [name]       Date: 06/20/2021 DOB: Apr 07, 1976  Age: 45 y.o. MRN#: [id number] Attending Physician: Antonieta Pert, MD Primary Care Physician: Dorena Dew, FNP Admit Date: 06/17/2021  Reason for Consultation/Follow-up: Establishing goals of care  Subjective: Received call regarding recurrence of bleeding this afternoon.  I saw and examined Rebecca [name] and met with her and her mother, Rebecca [name].  We discussed working to get home, spend time with family, and continuing to feels as well as she can being the most important things moving forward.  We discussed plan of care moving forward and contingency plans for continued bleeding including transfusion support, utilization of surgery form if needed, and input by radiation oncology.  She tells me that she was scheduled to have a 5 session course radiation in [place] but she was hurting so much she could not lay flat to tolerate treatments.  She feels her pain is better controlled now that she would be able to tolerate these.  Discussed that radiation oncology would need to determine if this would be beneficial for helping control bleeding, and now is also very clear and she reports understanding this would strictly be palliative in nature with hope of controlling bleeding rather than "treating" her underlying disease.  Discussed her desire for continued aggressive care and concern that heroic interventions in the event of cardiac or respiratory arrest is not going to fix her underlying physiologic issues and it is not going to result in her being well enough to return home and spend time with family.  Her mother expressed understanding this and states they have been told this before.  Rebecca [name], however, listens  to my concerns, but then states that she is, "not going to go there because it is going to make me more anxious."  I asked her to talk more with me about what she meant by that and she expressed that she has been told by multiple doctors that "coding" her would be painful and not helpful, however, she is also clear in stating that she desires to remain a full code and that she is not going to talk further about it.  Her mother also tried to get her to engage in conversation with me at this point, however, Shatira told her, "I am done  talking about it for now."  Length of Stay: 2  Current Medications: Scheduled Meds:  . (feeding supplement) PROSource Plus  30 mL Oral BID BM  . sodium chloride   Intravenous Once  . sodium chloride   Intravenous Once  . Chlorhexidine Gluconate Cloth  6 each Topical Daily  . doxycycline  100 mg Oral Q12H  . gabapentin  300 mg Oral TID  . mouth rinse  15 mL Mouth Rinse BID  . methadone  5 mg Oral Q8H  . Metronidazole (Flagyl) 553m tablet  1,000 mg Topical Daily  . nutrition supplement (JUVEN)  1 packet Oral BID BM  . pantoprazole  40 mg Oral Daily  . polyethylene glycol  17 g Oral Daily  . sodium chloride flush  10-40 mL Intracatheter Q12H    Continuous Infusions: . cefTRIAXone (ROCEPHIN)  IV 1 g (06/19/21 2312)    PRN Meds: acetaminophen **OR** acetaminophen, alum & mag hydroxide-simeth, gelatin adsorbable, LORazepam, oxyCODONE **OR** morphine injection, ondansetron **OR** ondansetron (ZOFRAN) IV  Physical Exam         General: Awake and alert HEENT: No bruits, no goiter, no JVD Heart: Tachycardic. No murmur appreciated. Lungs: Good air movement, clear Abdomen: Soft, nontender, nondistended, positive bowel sounds.   Ext: No significant edema Skin: Warm and dry, left hand with dressing in place.  Area of right breast partially exposed from bandage and is a deep chest wound with erosion and necrotic tissue all the way down to the chest wall Neuro:  Grossly intact, nonfocal.  Vital Signs: BP 132/81   Pulse (!) 123   Temp 98.3 F (36.8 C) (Oral)   Resp 14   Ht 5' 1"  (1.549 m)   Wt 68 kg   SpO2 100%   BMI 28.34 kg/m  SpO2: SpO2: 100 % O2 Device: O2 Device: Room Air O2 Flow Rate: O2 Flow Rate (L/min): 2 L/min  Intake/output summary:  Intake/Output Summary (Last 24 hours) at 06/20/2021 2037 Last data filed at 06/20/2021 1859 Gross per 24 hour  Intake 2167.5 ml  Output --  Net 2167.5 ml    LBM:   Baseline Weight: Weight: 68 kg Most recent weight: Weight:  (pt refused)       Palliative Assessment/Data:    Flowsheet Rows    Flowsheet Row Most Recent Value  Intake Tab   Referral Department Hospitalist  Unit at Time of Referral Med/Surg Unit  Palliative Care Primary Diagnosis Cancer  Date Notified 06/17/21  Palliative Care Type New Palliative care  Reason for referral Clarify Goals of Care  Date of Admission 06/17/21  Date first seen by Palliative Care 06/18/21  # of days Palliative referral response time 1 Day(s)  # of days IP prior to Palliative referral 0  Clinical Assessment   Palliative Performance Scale Score 40%  Psychosocial & Spiritual Assessment   Palliative Care Outcomes   Patient/Family meeting held? Yes  Who was at the meeting? Patient       Patient Active Problem List   Diagnosis Date Noted  . Severe anemia 06/18/2021  . Symptomatic anemia 06/17/2021  . GERD without esophagitis 06/17/2021  . Cellulitis of finger of left hand 06/17/2021  . Anasarca 06/17/2021  . Neutropenic fever ([facility] 08/14/2020  . Sepsis ([facility] 08/14/2020  . Acute lower UTI 08/14/2020  . Open chest wound, right, initial encounter 08/14/2020  . Anemia associated with chemotherapy 08/14/2020  . Neuropathy due to chemotherapeutic drug ([facility] 07/10/2019  . Recurrent breast adenocarcinoma ([facility] 07/02/2019  . Bone  metastases ([place]) 07/02/2019  . Goals of care, counseling/discussion 07/02/2019  . Morbid obesity with body mass  index (BMI) of 40.0 to 44.9 in adult ([place]) 04/10/2018  . Genetic testing 11/20/2016  . Family history of breast cancer 11/20/2016  . Malignant neoplasm of upper-outer quadrant of right breast in female, estrogen receptor negative ([place]) 02/04/2016  . Hirsutism 01/13/2016  . Tobacco dependence 01/13/2016  . Irregular periods/menstrual cycles 01/13/2016    Palliative Care Assessment & Plan   Patient Profile: 45 y.o. female  with past medical history of metastatic breast cancer diagnosed in 2017 admitted on 06/17/2021 with generalized weakness and chest wall pain.  Plan was for admission to hospice after seeing Dr. [name] on 7/14 but she got worse and presented to ED before this can be arranged.  Also noted to have cellulitis of her hand on the left.  Palliative consulted for goals of care.  Recommendations/Plan: Full code/full scope: Discussed again today, however, she will not really fully engage in conversation.  She is adamant that she is going to remain full code.  It is my opinion that, in light of her very advanced local disease, CPR would not be able to be effectively performed, would not result in her surviving hospitalization, and it is not medically appropriate in her situation.  Will continue to engage in conversation as she is emotionally able to do so. Eventual plan for transition home with hospice.  Await input from radiation oncology to determine if radiation may be beneficial to help control bleeding moving forward.   Code Status:    Code Status Orders  (From admission, onward)           Start     Ordered   06/17/21 2101  Full code  Continuous        06/17/21 2103           Code Status History     Date Active Date Inactive Code Status Order ID Comments User Context   08/14/2020 1938 08/17/2020 2015 Full Code [id number]  Kathie Dike, MD ED   09/06/2016 1430 09/07/2016 1211 Full Code [id number]  Alphonsa Overall, MD Inpatient   04/11/2016 1313 04/12/2016 0319 Full Code  [id number]  Arne Cleveland, MD HOV       Prognosis:  < 6 months  Discharge Planning: Home with Hospice  Care plan was discussed with patient Thank you for allowing the Palliative Medicine Team to assist in the care of this patient.  Start time: 1600 End time:1655  Total Time 55 Prolonged Time Billed No   Greater than 50%  of this time was spent counseling and coordinating care related to the above assessment and plan.   Micheline Rough, MD  Please contact Palliative Medicine Team phone at [phone] for questions and concerns.

## 2021-06-21 ENCOUNTER — Ambulatory Visit
Admission: RE | Admit: 2021-06-21 | Discharge: 2021-06-21 | Disposition: A | Discharge status: Home / Self Care | Payer: Medicaid Other | Source: Ambulatory Visit | Attending: Radiation Oncology | Admitting: Radiation Oncology

## 2021-06-21 DIAGNOSIS — C50411 Malignant neoplasm of upper-outer quadrant of right female breast: Secondary | ICD-10-CM

## 2021-06-21 DIAGNOSIS — D649 Anemia, unspecified: Secondary | ICD-10-CM | POA: Diagnosis not present

## 2021-06-21 DIAGNOSIS — C7951 Secondary malignant neoplasm of bone: Secondary | ICD-10-CM | POA: Diagnosis not present

## 2021-06-21 DIAGNOSIS — Z510 Encounter for antineoplastic radiation therapy: Secondary | ICD-10-CM | POA: Insufficient documentation

## 2021-06-21 DIAGNOSIS — Z171 Estrogen receptor negative status [ER-]: Secondary | ICD-10-CM | POA: Insufficient documentation

## 2021-06-21 DIAGNOSIS — L03012 Cellulitis of left finger: Secondary | ICD-10-CM | POA: Diagnosis not present

## 2021-06-21 DIAGNOSIS — Z7189 Other specified counseling: Secondary | ICD-10-CM | POA: Diagnosis not present

## 2021-06-21 LAB — BPAM RBC
Blood Product Expiration Date: 202208142359
Blood Product Expiration Date: 202208142359
Blood Product Expiration Date: 202208162359
Blood Product Expiration Date: 202208162359
Blood Product Expiration Date: 202208192359
Blood Product Expiration Date: 202208192359
ISSUE DATE / TIME: 202207150218
ISSUE DATE / TIME: 202207150919
ISSUE DATE / TIME: 202207171456
ISSUE DATE / TIME: 202207171744
Unit Type and Rh: 5100
Unit Type and Rh: 5100
Unit Type and Rh: 5100
Unit Type and Rh: 5100
Unit Type and Rh: 5100
Unit Type and Rh: 5100

## 2021-06-21 LAB — TYPE AND SCREEN
ABO/RH(D): O POS
Antibody Screen: NEGATIVE
Unit division: 0
Unit division: 0
Unit division: 0
Unit division: 0
Unit division: 0
Unit division: 0

## 2021-06-21 LAB — CBC WITH DIFFERENTIAL/PLATELET
Abs Immature Granulocytes: 0.18 10*3/uL — ABNORMAL HIGH (ref 0.00–0.07)
Basophils Absolute: 0 10*3/uL (ref 0.0–0.1)
Basophils Relative: 0 %
Eosinophils Absolute: 0 10*3/uL (ref 0.0–0.5)
Eosinophils Relative: 0 %
HCT: 18.6 % — ABNORMAL LOW (ref 36.0–46.0)
Hemoglobin: 6.4 g/dL — CL (ref 12.0–15.0)
Immature Granulocytes: 1 %
Lymphocytes Relative: 7 %
Lymphs Abs: 1.1 10*3/uL (ref 0.7–4.0)
MCH: 29.4 pg (ref 26.0–34.0)
MCHC: 34.4 g/dL (ref 30.0–36.0)
MCV: 85.3 fL (ref 80.0–100.0)
Monocytes Absolute: 1.4 10*3/uL — ABNORMAL HIGH (ref 0.1–1.0)
Monocytes Relative: 9 %
Neutro Abs: 12.7 10*3/uL — ABNORMAL HIGH (ref 1.7–7.7)
Neutrophils Relative %: 83 %
Platelets: 130 10*3/uL — ABNORMAL LOW (ref 150–400)
RBC: 2.18 MIL/uL — ABNORMAL LOW (ref 3.87–5.11)
RDW: 17.2 % — ABNORMAL HIGH (ref 11.5–15.5)
WBC: 15.3 10*3/uL — ABNORMAL HIGH (ref 4.0–10.5)
nRBC: 0 % (ref 0.0–0.2)

## 2021-06-21 LAB — HEMOGLOBIN AND HEMATOCRIT, BLOOD
HCT: 27.6 % — ABNORMAL LOW (ref 36.0–46.0)
Hemoglobin: 9 g/dL — ABNORMAL LOW (ref 12.0–15.0)

## 2021-06-21 MED ORDER — SENNOSIDES-DOCUSATE SODIUM 8.6-50 MG PO TABS
1.0000 | ORAL_TABLET | Freq: Two times a day (BID) | ORAL | Status: DC
  Administered 2021-06-21 – 2021-06-26 (×10): 1 via ORAL
  Filled 2021-06-21 (×10): qty 1

## 2021-06-21 MED ORDER — METOPROLOL TARTRATE 25 MG PO TABS
25.0000 mg | ORAL_TABLET | Freq: Two times a day (BID) | ORAL | Status: DC
  Administered 2021-06-21 – 2021-06-22 (×4): 25 mg via ORAL
  Filled 2021-06-21 (×4): qty 1

## 2021-06-21 MED FILL — Metronidazole Tab 500 MG: ORAL | Qty: 2 | Status: AC

## 2021-06-21 NOTE — Progress Notes (Signed)
HEMATOLOGY-ONCOLOGY PROGRESS NOTE  SUBJECTIVE: Rebecca [name] has been followed by Dr. [name] for triple negative breast cancer.  She developed metastatic disease in July 2020.  Her last visit with [place] was on 02/04/2021.  She received a second opinion at [facility] in [place], [place].  According to office notes, the patient's mother told us that she was released from [facility] and was told to enroll with hospice.  They anticipate her (to be approximately 2 weeks.  She advised our office that she had a fungating breast mass that was not bleeding significantly.  We made a referral to hospice.  However, due to significant bleeding, she came to the emergency department for further evaluation.  On admission, her hemoglobin was 5.2.  She has had intermittent, significant bleeding from her breast mass this admission.  She has received 4 units PRBC so far this admission.  She is being followed by palliative care.  She was also seen by general surgery for the fungating breast mass.  There is no plan for surgical intervention.  Patient asking if there is a role for radiation as she was told radiation was an option to help control bleeding at [facility].  However, she did not undergo the radiation due to significant pain.  Today, patient reports her pain is overall well controlled.  She is not having any active bleeding from her right breast mass.  She tells me today that she thinks that she might enroll in hospice upon discharge.  She thinks that hospice may be beneficial to help provide support to her in the home.  Oncology History  Malignant neoplasm of upper-outer quadrant of right breast in female, estrogen receptor negative ([place])  01/19/2016 Mammogram   Patient presented with palpable right breast lump.  1. Spiculated mass within the RIGHT breast at the 11:30 o'clock axis, 5 cm from the nipple, measuring 2.6 x 2.3 x 2.5 cm, corresponding to patient's palpable abnormality. ultrasound-guided biopsy recommended. 2.  Morphologically abnormal lymph node within the right axilla, mildly prominent with asymmetric cortex thickening to 5 mm,  ultrasound-guided biopsy also recommended.    01/28/2016 Initial Biopsy   Right upper outer quadrant biopsy: IDC, grade 3, 1 benign lymph node, ER-(0%), PR-(0%), Ki67 70%, HER-2 negative (ratio 1.32).    02/04/2016 Initial Diagnosis   Breast cancer of upper-outer quadrant of right female breast ([place])    04/14/2016 - 07/21/2016 Neo-Adjuvant Chemotherapy   Neoadjuvant chemotherapy, 4 cycles of dose dense Doxorubicin and Cyclophosphamide, followed by weekly Paclitaxel and Carboplatin.  Paclitaxel and Carbo stopped after 7 doses due to peripheral neuropathy.    04/26/2016 Breast MRI   3.0 cm mass in the central right breast corresponding with the biopsy proven invasive ductal carcinoma. Sub optimal imaging of the left breast secondary to motion. The patient will have a repeat MRI following neoadjuvant chemotherapy.    08/10/2016 Breast MRI   Within the central portion of the right breast there is an enhancing mass which measures 1.4 x 1.8 x 1.9 cm. Previously it measured 3.0 x 2.7 x 3.0 cm.    09/06/2016 Surgery   Right mastectomy [name]): IDC 2 cm, margins negative, 1/2 SLN positive for metastatic carcinoma, grade 3, triple negative.    10/04/2016 Genetic Testing   Genetic testing was negative and revealed no deleterious mutations or variants of uncertain significance.  Genes tested include:APC, ATM, AXIN2, BARD1, BMPR1A, BRCA1, BRCA2, BRIP1, CDH1, CDK4, CDKN2A, CHEK2, EPCAM, FANCC, MLH1, MSH2, MSH6, MUTYH, NBN, PALB2, PMS2, POLD1, POLE, PTEN, RAD51C,  RAD51D, SCG5/GREM1, SMAD4, STK11, TP53, VHL, and XRCC     11/15/2016 - 01/12/2017 Radiation Therapy   Adjuvant radiation ([place]): Right Chest Wall and axilla (4 field) treated to 45 Gy in 25 fractions, and then Boosted an additional 14.4 Gy in 8 fractions.    07/10/2019 - 10/30/2019 Chemotherapy   The patient had dexamethasone  (DECADRON) 4 MG tablet, 8 mg, Oral, Daily, 1 of 1 cycle, Start date: 07/04/2019, End date: 11/06/2019 palonosetron (ALOXI) injection 0.25 mg, 0.25 mg, Intravenous,  Once, 6 of 7 cycles Administration: 0.25 mg (07/10/2019), 0.25 mg (07/17/2019), 0.25 mg (07/31/2019), 0.25 mg (08/07/2019), 0.25 mg (08/21/2019), 0.25 mg (08/28/2019), 0.25 mg (09/11/2019), 0.25 mg (09/18/2019), 0.25 mg (10/02/2019), 0.25 mg (10/09/2019), 0.25 mg (10/23/2019), 0.25 mg (10/30/2019) CARBOplatin (PARAPLATIN) 300 mg in sodium chloride 0.9 % 250 mL chemo infusion, 300 mg (100 % of original dose 300 mg), Intravenous,  Once, 6 of 7 cycles Dose modification:   (original dose 300 mg, Cycle 1),   (original dose 300 mg, Cycle 1),   (original dose 300 mg, Cycle 1), 270 mg (original dose 300 mg, Cycle 5, Reason: Provider Judgment), 270 mg (original dose 300 mg, Cycle 6) Administration: 300 mg (07/10/2019), 300 mg (07/17/2019), 300 mg (07/31/2019), 300 mg (08/07/2019), 300 mg (08/21/2019), 300 mg (08/28/2019), 300 mg (09/11/2019), 300 mg (09/18/2019), 270 mg (10/02/2019), 270 mg (10/09/2019), 270 mg (10/23/2019), 270 mg (10/30/2019) gemcitabine (GEMZAR) 2,052 mg in sodium chloride 0.9 % 250 mL chemo infusion, 1,000 mg/m2 = 2,052 mg, Intravenous,  Once, 6 of 7 cycles Dose modification: 800 mg/m2 (original dose 1,000 mg/m2, Cycle 6, Reason: Provider Judgment) Administration: 2,052 mg (07/10/2019), 2,052 mg (07/17/2019), 2,052 mg (07/31/2019), 2,052 mg (08/07/2019), 2,052 mg (08/21/2019), 2,000 mg (08/28/2019), 2,000 mg (09/11/2019), 2,000 mg (09/18/2019), 2,000 mg (10/02/2019), 2,000 mg (10/09/2019), 1,634 mg (10/23/2019), 1,600 mg (10/30/2019)   for chemotherapy treatment.     11/13/2019 - 12/31/2019 Chemotherapy   The patient had dexamethasone (DECADRON) 4 MG tablet, 8 mg, Oral, Daily, 1 of 1 cycle, Start date: --, End date: -- palonosetron (ALOXI) injection 0.25 mg, 0.25 mg, Intravenous,  Once, 3 of 6 cycles Administration: 0.25 mg (11/13/2019), 0.25 mg (12/11/2019),  0.25 mg (12/31/2019) methotrexate (PF) chemo injection 80 mg, 39.35 mg/m2 = 81.25 mg, Intravenous,  Once, 3 of 6 cycles Administration: 80 mg (11/13/2019), 80 mg (12/11/2019), 80 mg (12/31/2019) cyclophosphamide (CYTOXAN) 1,220 mg in sodium chloride 0.9 % 250 mL chemo infusion, 600 mg/m2 = 1,220 mg, Intravenous,  Once, 3 of 6 cycles Administration: 1,220 mg (11/13/2019), 1,220 mg (12/11/2019), 1,220 mg (12/31/2019) fluorouracil (ADRUCIL) chemo injection 1,200 mg, 600 mg/m2 = 1,200 mg, Intravenous,  Once, 3 of 6 cycles Administration: 1,200 mg (11/13/2019), 1,200 mg (12/11/2019), 1,200 mg (12/31/2019)   for chemotherapy treatment.     04/07/2020 - 06/09/2020 Chemotherapy   The patient had DOXOrubicin HCL LIPOSOMAL (DOXIL) 100 mg in dextrose 5 % 500 mL chemo infusion, 50 mg/m2 = 100 mg, Intravenous,  Once, 4 of 5 cycles Dose modification: 40 mg/m2 (original dose 50 mg/m2, Cycle 4, Reason: Provider Judgment) Administration: 100 mg (04/07/2020), 80 mg (06/09/2020), 80 mg (05/19/2020), 80 mg (04/28/2020)   for chemotherapy treatment.     07/10/2020 - 09/26/2020 Chemotherapy   The patient had dexamethasone (DECADRON) 4 MG tablet, 1 of 1 cycle, Start date: 07/01/2020, End date: 08/14/2020 palonosetron (ALOXI) injection 0.25 mg, 0.25 mg, Intravenous,  Once, 4 of 5 cycles Administration: 0.25 mg (07/10/2020), 0.25 mg (07/17/2020), 0.25 mg (08/07/2020), 0.25 mg (08/27/2020), 0.25 mg (  09/10/2020), 0.25 mg (09/24/2020) pegfilgrastim-cbqv (UDENYCA) injection 6 mg, 6 mg, Subcutaneous, Once, 4 of 5 cycles Administration: 6 mg (07/21/2020), 6 mg (08/29/2020), 6 mg (09/12/2020), 6 mg (09/26/2020) fosaprepitant (EMEND) 150 mg in sodium chloride 0.9 % 145 mL IVPB, 150 mg, Intravenous,  Once, 4 of 5 cycles Administration: 150 mg (07/10/2020), 150 mg (07/17/2020), 150 mg (08/07/2020), 150 mg (08/27/2020), 150 mg (09/10/2020), 150 mg (09/24/2020) sacituzumab govitecan-hziy (TRODELVY) 1,000 mg in sodium chloride 0.9 % 250 mL (2.8571 mg/mL) chemo infusion,  10 mg/kg = 1,000 mg, Intravenous,  Once, 4 of 5 cycles Administration: 1,000 mg (07/10/2020), 900 mg (07/17/2020), 900 mg (08/07/2020), 900 mg (08/27/2020), 900 mg (09/10/2020), 900 mg (09/24/2020)   for chemotherapy treatment.     10/15/2020 - 10/22/2020 Chemotherapy   The patient had vinorelbine (NAVELBINE) 47 mg in sodium chloride 0.9 % 50 mL chemo infusion, 25 mg/m2 = 47 mg, Intravenous,  Once, 1 of 4 cycles Administration: 47 mg (10/15/2020), 47 mg (10/22/2020)   for chemotherapy treatment.     11/12/2020 -  Chemotherapy    Patient is on Treatment Plan: BREAST METASTATIC ERIBULIN Q21D       Recurrent breast adenocarcinoma ([place])  07/02/2019 Initial Diagnosis   Recurrent breast adenocarcinoma ([place])    04/07/2020 - 06/09/2020 Chemotherapy   The patient had DOXOrubicin HCL LIPOSOMAL (DOXIL) 100 mg in dextrose 5 % 500 mL chemo infusion, 50 mg/m2 = 100 mg, Intravenous,  Once, 4 of 5 cycles Dose modification: 40 mg/m2 (original dose 50 mg/m2, Cycle 4, Reason: Provider Judgment) Administration: 100 mg (04/07/2020), 80 mg (06/09/2020), 80 mg (05/19/2020), 80 mg (04/28/2020)   for chemotherapy treatment.     07/10/2020 - 09/26/2020 Chemotherapy   The patient had dexamethasone (DECADRON) 4 MG tablet, 1 of 1 cycle, Start date: 07/01/2020, End date: 08/14/2020 palonosetron (ALOXI) injection 0.25 mg, 0.25 mg, Intravenous,  Once, 4 of 5 cycles Administration: 0.25 mg (07/10/2020), 0.25 mg (07/17/2020), 0.25 mg (08/07/2020), 0.25 mg (08/27/2020), 0.25 mg (09/10/2020), 0.25 mg (09/24/2020) pegfilgrastim-cbqv (UDENYCA) injection 6 mg, 6 mg, Subcutaneous, Once, 4 of 5 cycles Administration: 6 mg (07/21/2020), 6 mg (08/29/2020), 6 mg (09/12/2020), 6 mg (09/26/2020) fosaprepitant (EMEND) 150 mg in sodium chloride 0.9 % 145 mL IVPB, 150 mg, Intravenous,  Once, 4 of 5 cycles Administration: 150 mg (07/10/2020), 150 mg (07/17/2020), 150 mg (08/07/2020), 150 mg (08/27/2020), 150 mg (09/10/2020), 150 mg (09/24/2020) sacituzumab  govitecan-hziy (TRODELVY) 1,000 mg in sodium chloride 0.9 % 250 mL (2.8571 mg/mL) chemo infusion, 10 mg/kg = 1,000 mg, Intravenous,  Once, 4 of 5 cycles Administration: 1,000 mg (07/10/2020), 900 mg (07/17/2020), 900 mg (08/07/2020), 900 mg (08/27/2020), 900 mg (09/10/2020), 900 mg (09/24/2020)   for chemotherapy treatment.     07/31/2020 Cancer Staging   Staging form: Breast, AJCC 8th Edition - Clinical: Stage IV (pM1) - Signed by Gardenia Phlegm, NP on 07/31/2020    07/31/2020 Cancer Staging   Staging form: Breast, AJCC 8th Edition - Pathologic: Stage IV (pM1) - Signed by Gardenia Phlegm, NP on 07/31/2020    10/15/2020 - 10/22/2020 Chemotherapy   The patient had vinorelbine (NAVELBINE) 47 mg in sodium chloride 0.9 % 50 mL chemo infusion, 25 mg/m2 = 47 mg, Intravenous,  Once, 1 of 4 cycles Administration: 47 mg (10/15/2020), 47 mg (10/22/2020)   for chemotherapy treatment.     11/12/2020 -  Chemotherapy    Patient is on Treatment Plan: BREAST METASTATIC ERIBULIN Q21D       Bone metastases ([place])  07/02/2019 Initial  Diagnosis   Bone metastases ([place])    04/07/2020 - 06/09/2020 Chemotherapy   The patient had DOXOrubicin HCL LIPOSOMAL (DOXIL) 100 mg in dextrose 5 % 500 mL chemo infusion, 50 mg/m2 = 100 mg, Intravenous,  Once, 4 of 5 cycles Dose modification: 40 mg/m2 (original dose 50 mg/m2, Cycle 4, Reason: Provider Judgment) Administration: 100 mg (04/07/2020), 80 mg (06/09/2020), 80 mg (05/19/2020), 80 mg (04/28/2020)   for chemotherapy treatment.     07/10/2020 - 09/26/2020 Chemotherapy   The patient had dexamethasone (DECADRON) 4 MG tablet, 1 of 1 cycle, Start date: 07/01/2020, End date: 08/14/2020 palonosetron (ALOXI) injection 0.25 mg, 0.25 mg, Intravenous,  Once, 4 of 5 cycles Administration: 0.25 mg (07/10/2020), 0.25 mg (07/17/2020), 0.25 mg (08/07/2020), 0.25 mg (08/27/2020), 0.25 mg (09/10/2020), 0.25 mg (09/24/2020) pegfilgrastim-cbqv (UDENYCA) injection 6 mg, 6 mg,  Subcutaneous, Once, 4 of 5 cycles Administration: 6 mg (07/21/2020), 6 mg (08/29/2020), 6 mg (09/12/2020), 6 mg (09/26/2020) fosaprepitant (EMEND) 150 mg in sodium chloride 0.9 % 145 mL IVPB, 150 mg, Intravenous,  Once, 4 of 5 cycles Administration: 150 mg (07/10/2020), 150 mg (07/17/2020), 150 mg (08/07/2020), 150 mg (08/27/2020), 150 mg (09/10/2020), 150 mg (09/24/2020) sacituzumab govitecan-hziy (TRODELVY) 1,000 mg in sodium chloride 0.9 % 250 mL (2.8571 mg/mL) chemo infusion, 10 mg/kg = 1,000 mg, Intravenous,  Once, 4 of 5 cycles Administration: 1,000 mg (07/10/2020), 900 mg (07/17/2020), 900 mg (08/07/2020), 900 mg (08/27/2020), 900 mg (09/10/2020), 900 mg (09/24/2020)   for chemotherapy treatment.     10/15/2020 - 10/22/2020 Chemotherapy   The patient had vinorelbine (NAVELBINE) 47 mg in sodium chloride 0.9 % 50 mL chemo infusion, 25 mg/m2 = 47 mg, Intravenous,  Once, 1 of 4 cycles Administration: 47 mg (10/15/2020), 47 mg (10/22/2020)   for chemotherapy treatment.     11/12/2020 -  Chemotherapy    Patient is on Treatment Plan: BREAST METASTATIC ERIBULIN Q21D          REVIEW OF SYSTEMS:   Constitutional: Denies fevers, chills  Eyes: Denies blurriness of vision Ears, nose, mouth, throat, and face: Denies mucositis or sore throat Respiratory: Denies cough, dyspnea or wheezes Cardiovascular: Denies palpitation, chest discomfort Gastrointestinal:  Denies nausea, heartburn or change in bowel habits Skin: Denies abnormal skin rashes Lymphatics: Denies new lymphadenopathy or easy bruising Neurological:Denies numbness, tingling or new weaknesses Behavioral/Psych: Mood is stable, no new changes  Extremities: No lower extremity edema Breast: Fungating right breast mass that has been bleeding, not actively bleeding today. All other systems were reviewed with the patient and are negative.  I have reviewed the past medical history, past surgical history, social history and family history with the  patient and they are unchanged from previous note.   PHYSICAL EXAMINATION: ECOG PERFORMANCE STATUS: 2 - Symptomatic, <50% confined to bed  Vitals:   06/20/21 2149 06/21/21 0603  BP: 116/67 121/74  Pulse: (!) 106 (!) 106  Resp: 16 15  Temp: 98.7 F (37.1 C) 98.8 F (37.1 C)  SpO2: 100% 98%   Filed Weights   06/17/21 1724  Weight: 68 kg    Intake/Output from previous day: 07/17 0701 - 07/18 0700 In: 2672.5 [P.O.:240; Blood:932.5; IV Piggyback:1500] Out: -   GENERAL:alert, no distress and comfortable SKIN: Right breast mass covered with dressing.  I did not undress her right breast mass.  See photos from general surgery taken earlier today. EYES: normal, Conjunctiva are pink and non-injected, sclera clear LUNGS: clear to auscultation and percussion with normal breathing effort HEART: regular rate &  rhythm and no murmurs and no lower extremity edema ABDOMEN:abdomen soft, non-tender and normal bowel sounds NEURO: alert & oriented x 3 with fluent speech, no focal motor/sensory deficits  LABORATORY DATA:  I have reviewed the data as listed CMP Latest Ref Rng & Units 06/20/2021 06/18/2021 06/17/2021  Glucose 70 - 99 mg/dL 105(H) 85 101(H)  BUN 6 - 20 mg/dL 10 16 15   Creatinine 0.44 - 1.00 mg/dL 0.79 1.05(H) 1.17(H)  Sodium 135 - 145 mmol/L 138 136 137  Potassium 3.5 - 5.1 mmol/L 3.2(L) 4.5 4.4  Chloride 98 - 111 mmol/L 105 99 97(L)  CO2 22 - 32 mmol/L 25 28 24   Calcium 8.9 - 10.3 mg/dL 7.4(L) 7.3(L) 7.9(L)  Total Protein 6.5 - 8.1 g/dL - 5.2(L) 5.8(L)  Total Bilirubin 0.3 - 1.2 mg/dL - 0.6 0.5  Alkaline Phos 38 - 126 U/L - 166(H) 192(H)  AST 15 - 41 U/L - 75(H) 52(H)  ALT 0 - 44 U/L - 20 20    Lab Results  Component Value Date   WBC 14.1 (H) 06/20/2021   HGB 9.0 (L) 06/21/2021   HCT 27.6 (L) 06/21/2021   MCV 87.9 06/20/2021   PLT 135 (L) 06/20/2021   NEUTROABS 12.7 (H) 06/18/2021    No results found.  ASSESSMENT AND PLAN: 45 y.o. [place] woman status post  right breast upper-outer quadrant biopsy 01/28/2016 for a clinical T2 N0 invasive ductal carcinoma, grade 3, triple negative, with an MIB-1 of 70%.             (a) suspicious right axillary lymph node biopsied 01/28/2016 was benign   (1) neoadjuvant chemotherapy: doxorubicin and cyclophosphamide in dose dense fashion 4 started 04/14/16, completed 05/26/2016, followed by paclitaxel and carboplatin weekly 12, Started 06/09/2016             (a) taxol discontinued after 7 doses because of neuropathy, last dose 07/21/2016   (2) genetics testing October 20, 2016 through the 32-gene Comprehensive Cancer Panel offered by [facility] [place], MD) (with MSH2 Exons 1-7 Inversion Analysis) found no deleterious mutations or VUSS  In APC, ATM, AXIN2, BARD1, BMPR1A, BRCA1, BRCA2, BRIP1, CDH1, CDK4, CDKN2A, CHEK2, EPCAM, FANCC, MLH1, MSH2, MSH6, MUTYH, NBN, PALB2, PMS2, POLD1, POLE, PTEN, RAD51C, RAD51D, SCG5/GREM1, SMAD4, STK11, TP53, VHL, and XRCC2.     (3) right mastectomy and sentinel lymph node sampling 09/06/2016 showed a residual ypT1c ypN0, invasive ductal carcinoma, grade 3, with negative margins. Repeat prognostic panel again triple negative   (4) adjuvant radiation with capecitabine/Xeloda sensitization 11/15/16 - 01/12/17  Site/dose:   Right Chest Wall and axilla (4 field) treated to 45 Gy in 25 fractions, and then Boosted an additional 14.4 Gy in 8 fractions.   (5) tobacco abuse disorder: The patient quit smoking 04/04/2018   METASTATIC DISEASE:  July 2020: chest wall, bones, nodes (6) nonspecific changes noted on chest CT 06/11/2019 were clarified by PET scan 06/19/2019 showing hypermetabolic disease in the right anterior chest wall, right internal mammary nodes, right and left axillary nodes, but no metastatic disease in the neck, lungs, abdomen or pelvis.  Bone marrow uptake suggests bony metastatic disease.             (a) CARIS requested obtained from 06/28/2019 sample confirmed a  triple negativity, the tumor also was negative for the androgen receptor, was MSI stable and mismatch repair status proficient, with a low mutational burden.  BRCA 1 and 2 were negative and PD-L1 was negative.  PIK3 showed a variant of uncertain  significance.  However the tumor was genomic LOH high             (b) CA-27-29 is informative: was 118.3 on 06/04/2019   (7) zoledronate started 07/17/2019--on hold currently due to dental issues, and until 6 weeks at least after dental work   (8) carboplatin/ gemcitabine days 1 and 8 Q21 day cycle started 07/10/2019, last dose 10/30/2019             (a) restaging studies 09/2019 showed no progression             (b) restaging studies after 6 cycles showed mild disease progression   (9) cyclophosphamide, methotrexate, fluorouracil chemotherapy started 11/13/2019, repeated every 21 days.             (a) discontinued after cycle 3 (12/31/2019): No evidence of response   (10) started capecitabine 01/21/2020, given 1 week on and 1 week off at standard doses (1500 mg twice daily)             (a) Discontinued after almost three months of treatment (last on 04/06/2020)             (b) Disease progression on 4/27 CT scan showing increasing right chest wall disease, left breast lesions, and ? Liver involvement.             (c) MRI liver 04/14/2020 shows no liver invovlement   (11) Started liposomal doxorubicin/Doxil given on day 1 of a 21 day cycle on 04/07/2020             (a) echo on 04/02/2020 shows EF of 50-55%, repeat in 06/2020             (b) chest CT scan after 4 cycles of Doxil shows some evidence of progression in left axillary and right internal mammary adenopathy             (c) Doxil discontinued after 06/09/2020 dose   (12) started sacituzumab govitecan/ Trodelvy 07/10/2020             (a) day 8 cycle 2 omitted due to febrile neutropenia requiring admission             (b) changed to every two week dosing with Udenyca support after cycle 2              (c) discontinued after  09/24/2020 dose with evidence of disease progression   (13) started navelbine 10/15/2020, to be repeated days 1 and 8 of each 21-day cycle             (a) chest CT scan 10/06/2020 is new baseline study for measurable disease             (b) discontinued after one cycle at patient's request (not tolerated)   (14) started eribulin 11/12/2020, given days 1 and 8 of each 21-day cycle             (a) day 8 cycle 2 omitted because of intercurrent illness in her family  (68) transfer care to [facility] in [place], [place].  Their records are not available to me, but per the patient they have advised her to enroll with hospice.     PLAN: --I discussed the patient's care today with Dr. Domingo Cocking from palliative care and [place], [facility] Liaison. --Discussed recommendations with the patient from [facility] and agree that she should focus on comfort and enroll with hospice upon hospital discharge. --Recommend continued ongoing discussions with palliative care team regarding CODE STATUS,  advanced directives, and pain management. --I have reached out to radiation oncology to asked them to review her case to determine if there is a role for palliative radiation to help control bleeding to her fungating mass. --Will update Dr. [name] (her primary oncologist) about her admission/plans upon his return.    LOS: 3 days   Mikey Bussing, DNP, AGPCNP-BC, AOCNP 06/21/21

## 2021-06-21 NOTE — Progress Notes (Signed)
Daily Progress Note   Patient Name: Rebecca [name]       Date: 06/21/2021 DOB: 09-03-1976  Age: 45 y.o. MRN#: [id number] Attending Physician: Antonieta Pert, MD Primary Care Physician: Dorena Dew, FNP Admit Date: 06/17/2021  Reason for Consultation/Follow-up: Establishing goals of care  Subjective: I saw and examined Rebecca [name] today.  She was lying in bed in not distress.  We discussed her desire to have radiation if it is possible that it may help with recurrent bleeding.  She is open to any recommended regimen by rad onc team.  Length of Stay: 3  Current Medications: Scheduled Meds:  . (feeding supplement) PROSource Plus  30 mL Oral BID BM  . sodium chloride   Intravenous Once  . sodium chloride   Intravenous Once  . Chlorhexidine Gluconate Cloth  6 each Topical Daily  . doxycycline  100 mg Oral Q12H  . gabapentin  300 mg Oral TID  . mouth rinse  15 mL Mouth Rinse BID  . methadone  5 mg Oral Q8H  . metoprolol tartrate  25 mg Oral BID  . Metronidazole (Flagyl) 500mg  tablet  1,000 mg Topical Daily  . nutrition supplement (JUVEN)  1 packet Oral BID BM  . pantoprazole  40 mg Oral Daily  . polyethylene glycol  17 g Oral Daily  . senna-docusate  1 tablet Oral BID  . sodium chloride flush  10-40 mL Intracatheter Q12H    Continuous Infusions: . cefTRIAXone (ROCEPHIN)  IV 1 g (06/21/21 2121)    PRN Meds: acetaminophen **OR** acetaminophen, alum & mag hydroxide-simeth, gelatin adsorbable, LORazepam, oxyCODONE **OR** morphine injection, ondansetron **OR** ondansetron (ZOFRAN) IV  Physical Exam         General: Awake and alert HEENT: No bruits, no goiter, no JVD Heart: Tachycardic. No murmur appreciated. Lungs: Good air movement, clear Abdomen: Soft, nontender,  nondistended, positive bowel sounds.   Ext: No significant edema Skin: Warm and dry, left hand with dressing in place.  Area of right breast partially exposed from bandage and is a deep chest wound with erosion and necrotic tissue all the way down to the chest wall Neuro: Grossly intact, nonfocal.  Vital Signs: BP 112/67 (BP Location: Left Leg)   Pulse 96   Temp 98 F (36.7 C) (Oral)   Resp 16   Ht  5\' 1"  (1.549 m)   Wt 68 kg   SpO2 100%   BMI 28.34 kg/m  SpO2: SpO2: 100 % O2 Device: O2 Device: Nasal Cannula O2 Flow Rate: O2 Flow Rate (L/min): 2 L/min  Intake/output summary:  Intake/Output Summary (Last 24 hours) at 06/21/2021 2333 Last data filed at 06/21/2021 2122 Gross per 24 hour  Intake 130 ml  Output --  Net 130 ml    LBM:   Baseline Weight: Weight: 68 kg Most recent weight: Weight:  (pt refused)       Palliative Assessment/Data:    Flowsheet Rows    Flowsheet Row Most Recent Value  Intake Tab   Referral Department Hospitalist  Unit at Time of Referral Med/Surg Unit  Palliative Care Primary Diagnosis Cancer  Date Notified 06/17/21  Palliative Care Type New Palliative care  Reason for referral Clarify Goals of Care  Date of Admission 06/17/21  Date first seen by Palliative Care 06/18/21  # of days Palliative referral response time 1 Day(s)  # of days IP prior to Palliative referral 0  Clinical Assessment   Palliative Performance Scale Score 40%  Psychosocial & Spiritual Assessment   Palliative Care Outcomes   Patient/Family meeting held? Yes  Who was at the meeting? Patient       Patient Active Problem List   Diagnosis Date Noted  . Severe anemia 06/18/2021  . Symptomatic anemia 06/17/2021  . GERD without esophagitis 06/17/2021  . Cellulitis of finger of left hand 06/17/2021  . Anasarca 06/17/2021  . Neutropenic fever ([place]) 08/14/2020  . Sepsis ([place]) 08/14/2020  . Acute lower UTI 08/14/2020  . Open chest wound, right, initial encounter  08/14/2020  . Anemia associated with chemotherapy 08/14/2020  . Neuropathy due to chemotherapeutic drug ([place]) 07/10/2019  . Recurrent breast adenocarcinoma ([place]) 07/02/2019  . Bone metastases ([place]) 07/02/2019  . Goals of care, counseling/discussion 07/02/2019  . Morbid obesity with body mass index (BMI) of 40.0 to 44.9 in adult ([place]) 04/10/2018  . Genetic testing 11/20/2016  . Family history of breast cancer 11/20/2016  . Malignant neoplasm of upper-outer quadrant of right breast in female, estrogen receptor negative ([place]) 02/04/2016  . Hirsutism 01/13/2016  . Tobacco dependence 01/13/2016  . Irregular periods/menstrual cycles 01/13/2016    Palliative Care Assessment & Plan   Patient Profile: 45 y.o. female  with past medical history of metastatic breast cancer diagnosed in 2017 admitted on 06/17/2021 with generalized weakness and chest wall pain.  Plan was for admission to hospice after seeing Dr. [name] on 7/14 but she got worse and presented to ED before this can be arranged.  Also noted to have cellulitis of her hand on the left.  Palliative consulted for goals of care.  Recommendations/Plan: Full code/full scope Eventual plan for transition home with hospice once final plan in place regarding care of hand and plan for radiation.  Discussed today with radiation oncology and liaison from hospice agency.  Appreciate input from radiation oncology regarding radiation course that may be beneficial to help control bleeding moving forward.   Code Status:    Code Status Orders  (From admission, onward)           Start     Ordered   06/17/21 2101  Full code  Continuous        06/17/21 2103           Code Status History     Date Active Date Inactive Code Status Order ID Comments User Context  08/14/2020 1938 08/17/2020 2015 Full Code [id number]  Kathie Dike, MD ED   09/06/2016 1430 09/07/2016 1211 Full Code [id number]  Alphonsa Overall, MD Inpatient   04/11/2016 1313 04/12/2016  0319 Full Code [id number]  Arne Cleveland, MD HOV       Prognosis:  < 6 months  Discharge Planning: Home with Hospice  Care plan was discussed with patient Thank you for allowing the Palliative Medicine Team to assist in the care of this patient.  Total Time 20 Prolonged Time Billed No   Greater than 50%  of this time was spent counseling and coordinating care related to the above assessment and plan.  Micheline Rough, MD  Please contact Palliative Medicine Team phone at [phone] for questions and concerns.

## 2021-06-21 NOTE — Progress Notes (Signed)
Manufacturing engineer (ACC)   Visited with pt at bedside; report exchanged with Dr. Domingo Cocking, PMT, and care team.  Plan at this time to remain inpatient for palliative radiation.  [facility] liaisons will continue to follow for any discharge planning needs and to coordinate admission onto hospice care.    Thank you for the opportunity to participate in this patient's care.     Domenic Moras, BSN, RN [facility] Liaison [phone] (24h on call)

## 2021-06-21 NOTE — Anesthesia Postprocedure Evaluation (Signed)
Anesthesia Post Note  Patient: Rebecca [name]  Procedure(s) Performed: IRRIGATION AND DEBRIDEMENT EXTREMITY (Left: Hand)     Patient location during evaluation: PACU Anesthesia Type: General Level of consciousness: awake and alert Pain management: pain level controlled Vital Signs Assessment: post-procedure vital signs reviewed and stable Respiratory status: spontaneous breathing, nonlabored ventilation, respiratory function stable and patient connected to nasal cannula oxygen Cardiovascular status: blood pressure returned to baseline and stable Postop Assessment: no apparent nausea or vomiting Anesthetic complications: no   No notable events documented.  Last Vitals:  Vitals:   06/20/21 2149 06/21/21 0603  BP: 116/67 121/74  Pulse: (!) 106 (!) 106  Resp: 16 15  Temp: 37.1 C 37.1 C  SpO2: 100% 98%    Last Pain:  Vitals:   06/21/21 0413  TempSrc:   PainSc: Asleep                 Suleyman Ehrman L Lynett Brasil

## 2021-06-21 NOTE — Consult Note (Signed)
WOC Nurse Consult Note: Reason for Consult: Needed by Nursing to transcribe orders for left hand surgical site from Dr. Lequita Asal operative note. Wound type:Surgical Pressure Injury POA: N/A Dr, Lequita Asal op note states that wound care may begin on Monday using adaptic oil emulsion dressing Kellie Simmering #145) to the wound, toped with dry gauze and secured with conform or Kerlix roll gauze paper tape.  He will be in to see wound on Wednesday. Message relayed to RN K. O'Berry,order provided and supply of six (6)  3x3 inch pieces of adaptic equivalent sent to floor.  [place] nursing team will not follow, but will remain available to this patient, the nursing and medical teams.  Please re-consult if needed. Thanks, Maudie Flakes, MSN, RN, [place], Arther Abbott  Pager# [phone]

## 2021-06-21 NOTE — Consult Note (Signed)
Radiation Oncology         (336) [phone] ________________________________  Initial inpatient Consultation  Name: Rebecca [name] MRN: [id number]  Date of Service: 06/21/21 DOB: 1976-04-29  CC:Dorena Dew, FNP    REFERRING PHYSICIAN: Mikey Bussing, NP  DIAGNOSIS: The primary encounter diagnosis was Cellulitis of finger of left hand. Diagnoses of Malignant neoplasm of upper-outer quadrant of right breast in female, estrogen receptor negative ([place]), Bone metastases ([place]), Anemia, unspecified type, Pneumonia, and Symptomatic anemia were also pertinent to this visit.    ICD-10-CM   1. Cellulitis of finger of left hand  L03.012     2. Malignant neoplasm of upper-outer quadrant of right breast in female, estrogen receptor negative ([facility])  C50.411 LORazepam (ATIVAN) 0.5 MG tablet   Z17.1 methadone (DOLOPHINE) 5 MG tablet    3. Bone metastases ([facility])  C79.51 methadone (DOLOPHINE) 5 MG tablet    4. Anemia, unspecified type  D64.9     5. Pneumonia  J18.9 CANCELED: DG Chest 1 View    CANCELED: DG Chest 1 View    6. Symptomatic anemia  D64.9       HISTORY OF PRESENT ILLNESS: Rebecca [name] is a 45 y.o. female seen at the request of Mikey Bussing, NP with inpatient medical oncology for a bleeding chest wall defect with a history of metastatic triple negative right breast disease. She was originally diagnosed in 2017 and received neoadjuvant chemotherapy for node positive disease followed by right mastectomy and sentinel lymph node biopsies in October 2017, one of the 2 nodes were still positive.  She was counseled on the rationale for postmastectomy radiotherapy which she received with radiosensitizing Xeloda and completed in February 2018 with Dr. Sondra Come.  She was found to have metastatic disease in August 2020, she began chemotherapy but with mild disease progression switch to a different regimen.  Unfortunately no response was noted from cyclophosphamide methotrexate  and fluorouracil she was started on Xeloda in February 2021 and since that time has not had a good response to therapy or changes in regimens were made due to tolerability.  She has received [name] Navelbine, and eribulin but this was discontinued in January 2022 due to tolerability she went for second opinion at cancer treatment centers of [place] and was going to consider a palliative course of radiation to a recurring breast wound, this was not ultimately performed due to the inability to lay flat on the table for simulation.  Apparently she was using some type of mushroom treatment as well.  She is really recently relocated back to the [place] area and is currently under hospice care, she was admitted with concerns for an infection in her right arm as well as a bleeding wound from the breast.  While this has been bleeding off and on for some time, the patient started having drops in hemoglobin down into the 5.2 range. She is received 4 units of blood since she has been here and this morning her hemoglobin was 9.  We are asked to see the patient to consider radiation for palliative purposes and hospice care is in agreement to allow [place] to proceed with this treatment as this problem not only impacts her quality of life that potentially poses a more life-threatening situation.   PREVIOUS RADIATION THERAPY: Yes   11/15/16-01/12/17: The patient's right chest wall and regional nodes were treated to 45 Gy in 25 fractions, followed by a 14.4 Gy boost in 8 fractions.  PAST MEDICAL HISTORY:  Past Medical History:  Diagnosis Date   Anxiety    Breast cancer ([place])    Depression    GERD (gastroesophageal reflux disease)    History of radiation therapy 11/15/16-01/12/17   right chest wall and axilla treated to 45 Gy in 25 fractions, boosted and additional 14 Gy in 8 fractions   Hypertension    diet controlled   Obesity (BMI 35.0-39.9 without comorbidity)    Pneumonia    as a child   Seasonal allergies     Sickle cell trait ([place])    Termination of pregnancy (fetus) 04/02/16      PAST SURGICAL HISTORY: Past Surgical History:  Procedure Laterality Date   CESAREAN SECTION     2004 and 2007   I & D EXTREMITY Left 06/19/2021   Procedure: IRRIGATION AND DEBRIDEMENT EXTREMITY;  Surgeon: Iran Planas, MD;  Location: [facility];  Service: Orthopedics;  Laterality: Left;   MASTECTOMY W/ SENTINEL NODE BIOPSY Right 09/06/2016   Procedure: RIGHT BREAST MASTECTOMY WITH RIGHT AXILLARY SENTINEL LYMPH NODE BIOPSY;  Surgeon: Alphonsa Overall, MD;  Location: [facility];  Service: General;  Laterality: Right;   PORT-A-CATH REMOVAL Left 09/06/2016   Procedure: REMOVAL PORT-A-CATH;  Surgeon: Alphonsa Overall, MD;  Location: [facility];  Service: General;  Laterality: Left;   PORTACATH PLACEMENT     PORTACATH PLACEMENT N/A 07/09/2019   Procedure: INSERTION PORT-A-CATH WITH ULTRASOUND;  Surgeon: Alphonsa Overall, MD;  Location: [facility];  Service: General;  Laterality: N/A;    FAMILY HISTORY:  Family History  Problem Relation Age of Onset   Hypertension Mother    Cancer Mother        dx "intestinal cancer" in her 89s; +surgery   Other Mother        hysterectomy at young age for unspecified cause   Heart Problems Mother    Breast cancer Cousin        maternal 1st cousin dx female breast cancer at 69-46y   Cancer Father    Hypertension Father    Heart Problems Maternal Aunt    Diabetes Maternal Aunt    Breast cancer Maternal Uncle        dx 64-65   Heart Problems Maternal Uncle    Breast cancer Maternal Grandmother 50   Throat cancer Maternal Grandfather        d. 87s; smoker   Sickle cell anemia Paternal Aunt    Congestive Heart Failure Maternal Aunt    Multiple sclerosis Cousin    Cancer Other        maternal great uncle (MGM's brother); cancer removed from his side   Heart attack Paternal Aunt        d. early 5s    SOCIAL HISTORY:  Social History   Socioeconomic History   Marital  status: Single    Spouse name: Not on file   Number of children: 4   Years of education: Not on file   Highest education level: Not on file  Occupational History   Occupation: Private Care Attendant    Comment: [employer]  Tobacco Use   Smoking status: Former    Packs/day: 1.00    Years: 20.00    Pack years: 20.00    Types: Cigarettes    Quit date: 04/01/2018    Years since quitting: 3.2   Smokeless tobacco: Never   Tobacco comments:    Patient has quit smoking x 1 year now  Vaping Use   Vaping Use: Never used  Substance and Sexual Activity   Alcohol use: Yes    Comment: occ   Drug use: No   Sexual activity: Not on file  Other Topics Concern   Not on file  Social History Narrative   Not on file   Social Determinants of Health   Financial Resource Strain: Not on file  Food Insecurity: Not on file  Transportation Needs: Not on file  Physical Activity: Not on file  Stress: Not on file  Social Connections: Not on file  Intimate Partner Violence: Not on file  The patient is single and lives in [place]. She has teenage children at home.  ALLERGIES: Patient has no known allergies.  MEDICATIONS:  Current Facility-Administered Medications  Medication Dose Route Frequency Provider Last Rate Last Admin   (feeding supplement) PROSource Plus liquid 30 mL  30 mL Oral BID BM Kc, Ramesh, MD   30 mL at 06/20/21 1428   0.9 %  sodium chloride infusion (Manually program via Guardrails IV Fluids)   Intravenous Once Kc, Ramesh, MD       0.9 %  sodium chloride infusion (Manually program via Guardrails IV Fluids)   Intravenous Once Kc, Maren Beach, MD       acetaminophen (TYLENOL) tablet 650 mg  650 mg Oral Q6H PRN Shalhoub, Sherryll Burger, MD       Or   acetaminophen (TYLENOL) suppository 650 mg  650 mg Rectal Q6H PRN Shalhoub, Sherryll Burger, MD       alum & mag hydroxide-simeth (MAALOX/MYLANTA) 200-200-20 MG/5ML suspension 30 mL  30 mL Oral Q6H PRN Shalhoub, Sherryll Burger, MD        cefTRIAXone (ROCEPHIN) 1 g in sodium chloride 0.9 % 100 mL IVPB  1 g Intravenous Q24H Shalhoub, Sherryll Burger, MD 200 mL/hr at 06/20/21 2216 1 g at 06/20/21 2216   Chlorhexidine Gluconate Cloth 2 % PADS 6 each  6 each Topical Daily Shalhoub, Sherryll Burger, MD   6 each at 06/20/21 0920   doxycycline (VIBRA-TABS) tablet 100 mg  100 mg Oral Q12H Kc, Maren Beach, MD   100 mg at 06/20/21 2217   gabapentin (NEURONTIN) capsule 300 mg  300 mg Oral TID Vernelle Emerald, MD   300 mg at 06/20/21 2217   gelatin adsorbable (GELFOAM/SURGIFOAM) sponge 12-7 mm 1 each  1 each Topical PRN Armandina Gemma, MD       LORazepam (ATIVAN) tablet 0.5 mg  0.5 mg Oral Q8H PRN Vernelle Emerald, MD   0.5 mg at 06/20/21 2036   MEDLINE mouth rinse  15 mL Mouth Rinse BID Vernelle Emerald, MD   15 mL at 06/20/21 2218   methadone (DOLOPHINE) tablet 5 mg  5 mg Oral Q8H Shalhoub, Sherryll Burger, MD   5 mg at 06/21/21 6270   Metronidazole (Flagyl) 500mg  tablet  1,000 mg Topical Daily Para March R, RN   1,000 mg at 06/20/21 1014   oxyCODONE (Oxy IR/ROXICODONE) immediate release tablet 5 mg  5 mg Oral Q4H PRN Vernelle Emerald, MD   5 mg at 06/21/21 3500   Or   morphine 4 MG/ML injection 4 mg  4 mg Intravenous Q4H PRN Shalhoub, Sherryll Burger, MD   4 mg at 06/20/21 1539   nutrition supplement (JUVEN) (JUVEN) powder packet 1 packet  1 packet Oral BID BM Shalhoub, Sherryll Burger, MD   1 packet at 06/20/21 1429   ondansetron (ZOFRAN) tablet 4 mg  4 mg Oral Q6H PRN Shalhoub, Sherryll Burger, MD  Or   ondansetron (ZOFRAN) injection 4 mg  4 mg Intravenous Q6H PRN Shalhoub, Sherryll Burger, MD       pantoprazole (PROTONIX) EC tablet 40 mg  40 mg Oral Daily Shalhoub, Sherryll Burger, MD   40 mg at 06/20/21 1009   polyethylene glycol (MIRALAX / GLYCOLAX) packet 17 g  17 g Oral Daily Shalhoub, Sherryll Burger, MD   17 g at 06/20/21 1013   sodium chloride flush (NS) 0.9 % injection 10-40 mL  10-40 mL Intracatheter Q12H Shalhoub, Sherryll Burger, MD   10 mL at 06/19/21 2225    REVIEW OF SYSTEMS:  On  review of systems, the patient reports that she is doing okay. She's been hungry and is awaiting her lunch today but seems to be feeling okay with pain controlled at the moment. She denies any bleeding currently from the breast wound. No other complaints are verbalized.    PHYSICAL EXAM:  Wt Readings from Last 3 Encounters:  06/17/21 150 lb (68 kg)  02/04/21 170 lb 14.4 oz (77.5 kg)  01/21/21 174 lb (78.9 kg)   Temp Readings from Last 3 Encounters:  06/21/21 98.8 F (37.1 C)  02/06/21 98.7 F (37.1 C) (Oral)  02/04/21 97.9 F (36.6 C) (Temporal)   BP Readings from Last 3 Encounters:  06/21/21 121/74  02/06/21 108/70  02/04/21 103/64   Pulse Readings from Last 3 Encounters:  06/21/21 (!) 106  02/06/21 100  02/04/21 (!) 107   Pain Assessment Pain Score: Asleep Pain Frequency: Constant/10  Unable to assess due to encounter type.   KPS = 40  100 - Normal; no complaints; no evidence of disease. 90   - Able to carry on normal activity; minor signs or symptoms of disease. 80   - Normal activity with effort; some signs or symptoms of disease. 59   - Cares for self; unable to carry on normal activity or to do active work. 60   - Requires occasional assistance, but is able to care for most of his personal needs. 50   - Requires considerable assistance and frequent medical care. 37   - Disabled; requires special care and assistance. 41   - Severely disabled; hospital admission is indicated although death not imminent. 74   - Very sick; hospital admission necessary; active supportive treatment necessary. 10   - Moribund; fatal processes progressing rapidly. 0     - Dead  Karnofsky DA, Abelmann [place], [place] LS and Burchenal [facility] [phone]) The use of the nitrogen mustards in the palliative treatment of carcinoma: with particular reference to bronchogenic carcinoma Cancer 1 634-56  LABORATORY DATA:  Lab Results  Component Value Date   WBC 14.1 (H) 06/20/2021   HGB 9.0 (L) 06/21/2021    HCT 27.6 (L) 06/21/2021   MCV 87.9 06/20/2021   PLT 135 (L) 06/20/2021   Lab Results  Component Value Date   NA 138 06/20/2021   K 3.2 (L) 06/20/2021   CL 105 06/20/2021   CO2 25 06/20/2021   Lab Results  Component Value Date   ALT 20 06/18/2021   AST 75 (H) 06/18/2021   ALKPHOS 166 (H) 06/18/2021   BILITOT 0.6 06/18/2021     RADIOGRAPHY: No results found.    IMPRESSION/PLAN: 1. 45 y.o. woman with recurrent metastatic triple negative right breast cancer. Dr. Tammi Klippel has reviewed her case and may offer a palliative course of radiotherapy to the chest wall wound. Her prior treatment makes this more difficult, but we will assess the site  and he will make a final decision when she comes downstairs today to simulate. We have cleared through hospice that they would cover a palliative course of radiation for this patient. We discussed the risks, benefits, short, and long term effects of radiotherapy, as well as the palliative intent, and the patient is interested in proceeding.  We will make final decision regarding treatment when she simulates today.   In a visit lasting 70 minutes, greater than 50% of the time was spent by phone and in floor time discussing the patient's condition, in preparation for the discussion, and coordinating the patient's care.     Carola Rhine, [facility]   Page Me    On Behalf Of _____________________________________  Sheral Apley Tammi Klippel, M.D.

## 2021-06-21 NOTE — Progress Notes (Signed)
  Radiation Oncology         (336) [phone] ________________________________  Name: Rebecca [name] MRN: [id number]  Date: 06/21/2021  DOB: 1976/01/18  INPATIENT   SIMULATION AND TREATMENT PLANNING NOTE    ICD-10-CM   1. Malignant neoplasm of upper-outer quadrant of right breast in female, estrogen receptor negative ([place])  C50.411    Z17.1       DIAGNOSIS:  45 yo woman with an actively bleeding locally recurrent right breast cancer in the previously irradiated right chest wall.  NARRATIVE:  The patient was brought to the [place].  Identity was confirmed.  All relevant records and images related to the planned course of therapy were reviewed.  The patient freely provided informed written consent to proceed with treatment after reviewing the details related to the planned course of therapy. The consent form was witnessed and verified by the simulation staff.  Then, the patient was set-up in a stable reproducible  supine position for radiation therapy.  CT images were obtained.  Surface markings were placed.  The CT images were loaded into the planning software.  Then the target and avoidance structures were contoured.  Treatment planning then occurred.  The radiation prescription was entered and confirmed.  Then, I designed and supervised the construction of a total of at least 4 medically necessary complex treatment devices including BodyFix positioner and 3 MLCs to shield lungs and heart.  I have requested : 3D Simulation  I have requested a DVH of the following structures: right lung, left lung heart, skin and target volumes.  SPECIAL TREATMENT PROCEDURE:  The planned course of therapy using radiation constitutes a special treatment procedure. Special care is required in the management of this patient for the following reasons. This treatment constitutes a Special Treatment Procedure for the following reason: [ Retreatment in a previously radiated area requiring  careful monitoring of increased risk of toxicity due to overlap of previous treatment..  The special nature of the planned course of radiotherapy will require increased physician supervision and oversight to ensure patient's safety with optimal treatment outcomes.  PLAN:  The patient will receive 20 Gy in 5 fractions.  ________________________________  Sheral Apley Tammi Klippel, M.D.

## 2021-06-21 NOTE — Progress Notes (Signed)
2 Days Post-Op  Subjective: CC: No further bleeding from R breast. Received 1U PRBC yesterday. Hgb 6.3 > 9.0 this am.   Objective: Vital signs in last 24 hours: Temp:  [97.8 F (36.6 C)-98.8 F (37.1 C)] 98.8 F (37.1 C) (07/18 0603) Pulse Rate:  [106-138] 106 (07/18 0603) Resp:  [14-18] 15 (07/18 0603) BP: (113-140)/(59-81) 121/74 (07/18 0603) SpO2:  [98 %-100 %] 98 % (07/18 0603)    Intake/Output from previous day: 07/17 0701 - 07/18 0700 In: 2672.5 [P.O.:240; Blood:932.5; IV Piggyback:1500] Out: -  Intake/Output this shift: No intake/output data recorded.  PE: R breast: Chaperone present. As noted below. No active bleeding. Picture taken with patients consent.     Lab Results:  Recent Labs    06/19/21 0315 06/20/21 1450 06/21/21 0339  WBC 16.7* 14.1*  --   HGB 8.0* 6.3* 9.0*  HCT 24.6* 19.7* 27.6*  PLT 134* 135*  --    BMET Recent Labs    06/20/21 1450  NA 138  K 3.2*  CL 105  CO2 25  GLUCOSE 105*  BUN 10  CREATININE 0.79  CALCIUM 7.4*   PT/INR Recent Labs    06/20/21 1450  LABPROT 19.2*  INR 1.6*   CMP     Component Value Date/Time   NA 138 06/20/2021 1450   NA 143 10/04/2016 1154   K 3.2 (L) 06/20/2021 1450   K 3.8 10/04/2016 1154   CL 105 06/20/2021 1450   CO2 25 06/20/2021 1450   CO2 26 10/04/2016 1154   GLUCOSE 105 (H) 06/20/2021 1450   GLUCOSE 100 10/04/2016 1154   BUN 10 06/20/2021 1450   BUN 11.4 10/04/2016 1154   CREATININE 0.79 06/20/2021 1450   CREATININE 0.80 08/07/2020 1129   CREATININE 0.8 10/04/2016 1154   CALCIUM 7.4 (L) 06/20/2021 1450   CALCIUM 9.7 10/04/2016 1154   PROT 5.2 (L) 06/18/2021 0809   PROT 8.0 10/04/2016 1154   ALBUMIN 2.0 (L) 06/18/2021 0809   ALBUMIN 3.5 10/04/2016 1154   AST 75 (H) 06/18/2021 0809   AST 13 (L) 08/07/2020 1129   AST 11 10/04/2016 1154   ALT 20 06/18/2021 0809   ALT 10 08/07/2020 1129   ALT 10 10/04/2016 1154   ALKPHOS 166 (H) 06/18/2021 0809   ALKPHOS 72 10/04/2016 1154    BILITOT 0.6 06/18/2021 0809   BILITOT 0.3 08/07/2020 1129   BILITOT <0.22 10/04/2016 1154   GFRNONAA >60 06/20/2021 1450   GFRNONAA >60 08/07/2020 1129   GFRNONAA >89 01/12/2016 1115   GFRAA >60 08/27/2020 0853   GFRAA >60 08/07/2020 1129   GFRAA >89 01/12/2016 1115   Lipase  No results found for: LIPASE     Studies/Results: No results found.  Anti-infectives: Anti-infectives (From admission, onward)    Start     Dose/Rate Route Frequency Ordered Stop   06/19/21 1100  doxycycline (VIBRA-TABS) tablet 100 mg        100 mg Oral Every 12 hours 06/19/21 1013     06/18/21 1400  metroNIDAZOLE (FLAGYL) tablet 500 mg  Status:  Discontinued        500 mg Oral Daily 06/18/21 1310 06/18/21 1323   06/17/21 2200  cefTRIAXone (ROCEPHIN) 1 g in sodium chloride 0.9 % 100 mL IVPB        1 g 200 mL/hr over 30 Minutes Intravenous Every 24 hours 06/17/21 2121     06/17/21 1745  doxycycline (VIBRAMYCIN) 100 mg in sodium chloride 0.9 % 250 mL  IVPB        100 mg 125 mL/hr over 120 Minutes Intravenous  Once 06/17/21 1739 06/17/21 2052   06/17/21 0000  doxycycline (VIBRAMYCIN) 100 MG capsule        100 mg Oral 2 times daily 06/17/21 1747          Assessment/Plan Right breast carcinoma with open wound and acute hemorrhage - No further bleeding today. Hgb responded appropriately to PRBC. Reviewed with attending. We will be available as needed moving forward. Would recommend Oncology consult. Palliative following.    LOS: 3 days    Jillyn Ledger , [facility] Surgery 06/21/2021, 10:10 AM Please see Amion for pager number during day hours 7:00am-4:30pm

## 2021-06-21 NOTE — Progress Notes (Signed)
PROGRESS NOTE    Josselin Gaulin Potts-Hairston  UKG:[id number] DOB: July 04, 1976 DOA: 06/17/2021 PCP: Dorena Dew, FNP   Chief Complaint  Patient presents with   Nausea   Bleeding/Bruising    Bleeding from tumor from breast cancer   Brief Narrative: 45 year old female with metastatic breast cancer diagnosed 2017, with mets to bone and chest wall GERD presented to the ED with generalized weakness, chest wall pain. As per the report patient was getting her cancer treatment under the care of [facility] in [place] and has suggested all treatment modalities and was referred for home hospice about 2 weeks ago .  She was referred for hospice with [facility] after patient contacted Dr. [name] office on 7/14 Patient complains of progressively worsening generalized weakness, also having bleeding from her wounds in the right chest wall and left axilla. Seen in the ED and found to have severe anemia and admitted. She was also found to have cellulitis abscess on the index finger of the left hand-underwent or an I&D 7/16. Repeated episode of bleeding on 7/17- tachycardic, hb donw to 6.3, give bolus iv and 2 unit prbc.CCS saw patient.  Subjective: she offers no new complaints. Appears not with interested to interact Remains tachycardic even the hemoglobin improved  Assessment & Plan:  Symptomatic severe anemia Anemia of chronic disease/cancer  and anemia from chronic blood loss from breast cancer To 4 units PRBC, hb stable now. Unfortunately will continue to have ongoing bleeding oozing from her cancer site, surgery has seen, could try Surgifoam dressing if needed, cont local pressure dressing, being evaluated radiation oncology to see if there is any role of radiotherapy to stop bleeding.    Cellulitis/abscess of fifth finger on left hand: S/p "Left small finger flexor sheath incision and drainage tendon sheath. Left small finger and hand drainage of complicated abscess deep space  abscess".  Continue dressing changes "beginning on Monday Adaptic dressings and dry dressings over the Adaptic" AND will keep her on oral antibiotics .she can follow-up with Dr. Caralyn Guile in a week  if she wants.  I discussed with Dr. Caralyn Guile  and he is okay w/ discharge and continue wound care at home hospice and can see him in clinic" .dressing changed today, wound culture growing Staphylococcus.  Continue current antibiotics including doxycycline.   Sinus tachycardia suspect multifactorial due to metastatic cancer, anemia.  Anemia has improved with transfusion.  Blood pressure stable, will add low-dose metoprolol.  Leucocytosis:in the setting of cellulitis/malignancy.  Monitor GERD-on ppi. Mild elevated JSE:GBTDVVO. Thrombocytopenia in the setting of metastatic disease.  Hypomagnesemia-repleted Hypokalemia repleted.  Malignant neoplasm of upper-outer quadrant of right breast in female, estrogen receptor negative metastatic disease: Poor prognosis appears patient has referred for home with hospice after she exhausted all chemotherapeutic options.  Radiation oncology evaluating for possible role of radiotherapy to stop bleeding.  Cont wound care.   Anasarca: Peripheral edema with severe hypoalbuminemia anemia likely from poor intake, in the setting of metastatic cancer, supportive measures.  Extensive right chest wall wound as well as left axillary wound Wound care to continue as she allows. Goals of care, counseling/discussion: Had a frank discussion this morning and see agreed for no chest compression but was rest of the care including intubation.  Palliative care following closely.  Plan is for home with hospice.   Patient refusing tele this am.  Diet Order             Diet regular Room service appropriate? Yes; Fluid consistency: Thin  Diet effective now                   Patient's Body mass index is 28.34 kg/m.  DVT prophylaxis: SCD's Start: 06/19/21 2029 SCDs Start: 06/17/21  2101 Code Status:   Code Status: Partial Code  Family Communication: plan of care discussed with patient and her daughter at bedside. Status is: Inpatient Remains inpatient for ongoing management of bleeding from the breast cancer and anemia  Dispo: The patient is from: Home              Anticipated d/c is to:  Home with hospice pending further recommendations by rad onc, PMT.              Patient currently is not medically stable to d/c.   Difficult to place patient No   Medications reviewed: Scheduled Meds:  (feeding supplement) PROSource Plus  30 mL Oral BID BM   sodium chloride   Intravenous Once   sodium chloride   Intravenous Once   Chlorhexidine Gluconate Cloth  6 each Topical Daily   doxycycline  100 mg Oral Q12H   gabapentin  300 mg Oral TID   mouth rinse  15 mL Mouth Rinse BID   methadone  5 mg Oral Q8H   Metronidazole (Flagyl) 500mg  tablet  1,000 mg Topical Daily   nutrition supplement (JUVEN)  1 packet Oral BID BM   pantoprazole  40 mg Oral Daily   polyethylene glycol  17 g Oral Daily   sodium chloride flush  10-40 mL Intracatheter Q12H   Continuous Infusions:  cefTRIAXone (ROCEPHIN)  IV 1 g (06/20/21 2216)   Consultants:see note  Procedures:see note Antimicrobials: Anti-infectives (From admission, onward)    Start     Dose/Rate Route Frequency Ordered Stop   06/19/21 1100  doxycycline (VIBRA-TABS) tablet 100 mg        100 mg Oral Every 12 hours 06/19/21 1013     06/18/21 1400  metroNIDAZOLE (FLAGYL) tablet 500 mg  Status:  Discontinued        500 mg Oral Daily 06/18/21 1310 06/18/21 1323   06/17/21 2200  cefTRIAXone (ROCEPHIN) 1 g in sodium chloride 0.9 % 100 mL IVPB        1 g 200 mL/hr over 30 Minutes Intravenous Every 24 hours 06/17/21 2121     06/17/21 1745  doxycycline (VIBRAMYCIN) 100 mg in sodium chloride 0.9 % 250 mL IVPB        100 mg 125 mL/hr over 120 Minutes Intravenous  Once 06/17/21 1739 06/17/21 2052   06/17/21 0000  doxycycline (VIBRAMYCIN)  100 MG capsule        100 mg Oral 2 times daily 06/17/21 1747        Culture/Microbiology    Component Value Date/Time   SDES  06/19/2021 1853    ABSCESS Performed at [facility], [place] [address] [place], [place] [address]    SPECREQUEST  06/19/2021 1853    LEFT HAND Performed at [facility], [place] [address], [place], [place] [address]    CULT  06/19/2021 1853    FEW STAPHYLOCOCCUS AUREUS SUSCEPTIBILITIES TO FOLLOW Performed at [facility], [place] [address], [place], [place] [address]    REPTSTATUS PENDING 06/19/2021 1853    Other culture-see note  Objective: Vitals: Today's Vitals   06/20/21 2130 06/20/21 2149 06/21/21 0413 06/21/21 0603  BP: 116/67 116/67  121/74  Pulse: (!) 106 (!) 106  (!) 106  Resp:  16 16  15   Temp: 98.7 F (37.1 C) 98.7 F (37.1 C)  98.8 F (37.1 C)  TempSrc: Oral Oral    SpO2: 100% 100%  98%  Weight:      Height:      PainSc:   Asleep     Intake/Output Summary (Last 24 hours) at 06/21/2021 1251 Last data filed at 06/20/2021 2149 Gross per 24 hour  Intake 2432.5 ml  Output --  Net 2432.5 ml    Filed Weights   06/17/21 1724  Weight: 68 kg   Weight change:   Intake/Output from previous day: 07/17 0701 - 07/18 0700 In: 2672.5 [P.O.:240; Blood:932.5; IV Piggyback:1500] Out: -  Intake/Output this shift: No intake/output data recorded. Filed Weights   06/17/21 1724  Weight: 68 kg   Examination: General exam: AAOx3,  HEENT:Oral mucosa moist, Ear/Nose WNL grossly, dentition normal. Respiratory system: bilaterally clear, anterior chest on the right side with extensive breast mass also lower chest wall visible , . Cardiovascular system: S1 & S2 +, No JVD,. Gastrointestinal system: Abdomen soft, NT,ND, BS+ Nervous System:Alert, awake, moving extremities and grossly nonfocal Extremities: left hand in dressing Skin: No rashes,no icterus. MSK: Normal muscle bulk,tone, power    Data  Reviewed: I have personally reviewed following labs and imaging studies CBC: Recent Labs  Lab 06/17/21 1850 06/18/21 0809 06/18/21 1523 06/19/21 0315 06/20/21 1450 06/21/21 0339  WBC 21.2* 15.3* 20.3* 16.7* 14.1*  --   NEUTROABS 18.9* 12.7*  --   --   --   --   HGB 5.2* 6.4* 8.5* 8.0* 6.3* 9.0*  HCT 16.3* 18.6* 25.6* 24.6* 19.7* 27.6*  MCV 85.3 85.3 84.8 85.4 87.9  --   PLT 158 130* 150 134* 135*  --     Basic Metabolic Panel: Recent Labs  Lab 06/17/21 1850 06/18/21 0809 06/20/21 1450  NA 137 136 138  K 4.4 4.5 3.2*  CL 97* 99 105  CO2 24 28 25   GLUCOSE 101* 85 105*  BUN 15 16 10   CREATININE 1.17* 1.05* 0.79  CALCIUM 7.9* 7.3* 7.4*  MG  --  1.1*  --     GFR: Estimated Creatinine Clearance: 76.7 mL/min (by C-G formula based on SCr of 0.79 mg/dL). Liver Function Tests: Recent Labs  Lab 06/17/21 1850 06/18/21 0809  AST 52* 75*  ALT 20 20  ALKPHOS 192* 166*  BILITOT 0.5 0.6  PROT 5.8* 5.2*  ALBUMIN 2.2* 2.0*    No results for input(s): LIPASE, AMYLASE in the last 168 hours. No results for input(s): AMMONIA in the last 168 hours. Coagulation Profile: Recent Labs  Lab 06/18/21 0809 06/20/21 1450  INR 1.5* 1.6*    Cardiac Enzymes: No results for input(s): CKTOTAL, CKMB, CKMBINDEX, TROPONINI in the last 168 hours. BNP (last 3 results) No results for input(s): PROBNP in the last 8760 hours. HbA1C: No results for input(s): HGBA1C in the last 72 hours. CBG: No results for input(s): GLUCAP in the last 168 hours. Lipid Profile: No results for input(s): CHOL, HDL, LDLCALC, TRIG, CHOLHDL, LDLDIRECT in the last 72 hours. Thyroid Function Tests: No results for input(s): TSH, T4TOTAL, FREET4, T3FREE, THYROIDAB in the last 72 hours. Anemia Panel: No results for input(s): VITAMINB12, FOLATE, FERRITIN, TIBC, IRON, RETICCTPCT in the last 72 hours. Sepsis Labs: No results for input(s): PROCALCITON, LATICACIDVEN in the last 168 hours.  Recent Results (from the  past 240 hour(s))  Resp Panel by RT-PCR (Flu A&B, Covid) Nasopharyngeal Swab     Status: None  Collection Time: 06/17/21  9:47 PM   Specimen: Nasopharyngeal Swab; Nasopharyngeal(NP) swabs in vial transport medium  Result Value Ref Range Status   SARS Coronavirus 2 by RT PCR NEGATIVE NEGATIVE Final    Comment: (NOTE) SARS-CoV-2 target nucleic acids are NOT DETECTED.  The SARS-CoV-2 RNA is generally detectable in upper respiratory specimens during the acute phase of infection. The lowest concentration of SARS-CoV-2 viral copies this assay can detect is 138 copies/mL. A negative result does not preclude SARS-Cov-2 infection and should not be used as the sole basis for treatment or other patient management decisions. A negative result may occur with  improper specimen collection/handling, submission of specimen other than nasopharyngeal swab, presence of viral mutation(s) within the areas targeted by this assay, and inadequate number of viral copies(<138 copies/mL). A negative result must be combined with clinical observations, patient history, and epidemiological information. The expected result is Negative.  Fact Sheet for Patients:  EntrepreneurPulse.com.au  Fact Sheet for Healthcare Providers:  IncredibleEmployment.be  This test is no t yet approved or cleared by the [place] FDA and  has been authorized for detection and/or diagnosis of SARS-CoV-2 by FDA under an Emergency Use Authorization (EUA). This EUA will remain  in effect (meaning this test can be used) for the duration of the COVID-19 declaration under Section 564(b)(1) of the Act, 21 U.S.C.section 360bbb-3(b)(1), unless the authorization is terminated  or revoked sooner.       Influenza A by PCR NEGATIVE NEGATIVE Final   Influenza B by PCR NEGATIVE NEGATIVE Final    Comment: (NOTE) The Xpert Xpress SARS-CoV-2/FLU/RSV plus assay is intended as an aid in the diagnosis of  influenza from Nasopharyngeal swab specimens and should not be used as a sole basis for treatment. Nasal washings and aspirates are unacceptable for Xpert Xpress SARS-CoV-2/FLU/RSV testing.  Fact Sheet for Patients: EntrepreneurPulse.com.au  Fact Sheet for Healthcare Providers: IncredibleEmployment.be  This test is not yet approved or cleared by the [place] FDA and has been authorized for detection and/or diagnosis of SARS-CoV-2 by FDA under an Emergency Use Authorization (EUA). This EUA will remain in effect (meaning this test can be used) for the duration of the COVID-19 declaration under Section 564(b)(1) of the Act, 21 U.S.C. section 360bbb-3(b)(1), unless the authorization is terminated or revoked.  Performed at [facility], [place] [address], [place], [place] [address]   Culture, blood (routine x 2)     Status: None (Preliminary result)   Collection Time: 06/18/21 12:22 AM   Specimen: BLOOD  Result Value Ref Range Status   Specimen Description   Final    BLOOD LEFT ANTECUBITAL Performed at [place] [address] [place], [place] [address]    Special Requests   Final    BOTTLES DRAWN AEROBIC ONLY Blood Culture adequate volume Performed at [facility] [facility] [address], [place], [place] [address]    Culture   Final    NO GROWTH 3 DAYS Performed at [facility], [place] [address], [place], [place] [address]    Report Status PENDING  Incomplete  Culture, blood (routine x 2)     Status: None (Preliminary result)   Collection Time: 06/18/21 12:22 AM   Specimen: BLOOD  Result Value Ref Range Status   Specimen Description   Final    BLOOD BLOOD LEFT HAND Performed at [facility] [address], [place], [place] [address]    Special Requests   Final  BOTTLES DRAWN AEROBIC ONLY Blood Culture adequate volume Performed at [facility] [address], [place], [place] [address]    Culture   Final    NO GROWTH 3 DAYS Performed at [facility], [place] [address], [place], [place] [address]    Report Status PENDING  Incomplete  Surgical pcr screen     Status: None   Collection Time: 06/19/21  6:08 PM   Specimen: Nasal Mucosa; Nasal Swab  Result Value Ref Range Status   MRSA, PCR NEGATIVE NEGATIVE Final   Staphylococcus aureus NEGATIVE NEGATIVE Final    Comment: (NOTE) The Xpert SA Assay (FDA approved for NASAL specimens in patients 26 years of age and older), is one component of a comprehensive surveillance program. It is not intended to diagnose infection nor to guide or monitor treatment. Performed at [facility], [place] [address] [place], [place] [address]   Aerobic/Anaerobic Culture w Gram Stain (surgical/deep wound)     Status: None (Preliminary result)   Collection Time: 06/19/21  6:53 PM   Specimen: PATH Other; Tissue  Result Value Ref Range Status   Specimen Description   Final    ABSCESS Performed at [facility] [address], [place], [place] [address]    Special Requests   Final    LEFT HAND Performed at [facility], [place] [address], [place], [place] [address]    Gram Stain   Final    MODERATE WBC PRESENT, PREDOMINANTLY PMN MODERATE GRAM POSITIVE COCCI IN CLUSTERS    Culture   Final    FEW STAPHYLOCOCCUS AUREUS SUSCEPTIBILITIES TO FOLLOW Performed at [facility], [place] [address] [place],  [address]    Report Status PENDING  Incomplete     Radiology Studies: No results found.   LOS: 3 days   Antonieta Pert, MD [facility]  06/21/2021, 12:51 PM

## 2021-06-21 NOTE — Progress Notes (Signed)
Patient removed telemetry at this time.  She states she does not want it back on at this time.  Educated patient that her HR has been elevated up to 140s and higher and she is still refusing at this time.  Dr. Lupita Leash notified. Will continue to monitor as able.

## 2021-06-21 NOTE — Progress Notes (Addendum)
Patient requests that I come back at different time with her medications, once she has eaten breakfast.  Will continue to monitor and check back with patient later.  She declines any dressing changes at this time and states she has the antibiotic to place on her dressing and will do so on her own, later.  She reiterates that she does her own dressing changes.

## 2021-06-22 DIAGNOSIS — C50411 Malignant neoplasm of upper-outer quadrant of right female breast: Secondary | ICD-10-CM | POA: Diagnosis not present

## 2021-06-22 DIAGNOSIS — D649 Anemia, unspecified: Secondary | ICD-10-CM | POA: Diagnosis not present

## 2021-06-22 DIAGNOSIS — Z7189 Other specified counseling: Secondary | ICD-10-CM | POA: Diagnosis not present

## 2021-06-22 DIAGNOSIS — L8990 Pressure ulcer of unspecified site, unspecified stage: Secondary | ICD-10-CM | POA: Insufficient documentation

## 2021-06-22 DIAGNOSIS — Z171 Estrogen receptor negative status [ER-]: Secondary | ICD-10-CM | POA: Diagnosis not present

## 2021-06-22 NOTE — Progress Notes (Signed)
Daily Progress Note   Patient Name: Rebecca [name]       Date: 06/22/2021 DOB: Jan 24, 1976  Age: 45 y.o. MRN#: [id number] Attending Physician: Antonieta Pert, MD Primary Care Physician: Dorena Dew, FNP Admit Date: 06/17/2021  Reason for Consultation/Follow-up: Establishing goals of care  Subjective: Awake alert, resting in bed, today is going good so far, daughter at bedside, patient states she is awaiting radiation due to start tomorrow.     Length of Stay: 4  Current Medications: Scheduled Meds:  . (feeding supplement) PROSource Plus  30 mL Oral BID BM  . sodium chloride   Intravenous Once  . sodium chloride   Intravenous Once  . Chlorhexidine Gluconate Cloth  6 each Topical Daily  . doxycycline  100 mg Oral Q12H  . gabapentin  300 mg Oral TID  . mouth rinse  15 mL Mouth Rinse BID  . methadone  5 mg Oral Q8H  . metoprolol tartrate  25 mg Oral BID  . Metronidazole (Flagyl) 500mg  tablet  1,000 mg Topical Daily  . nutrition supplement (JUVEN)  1 packet Oral BID BM  . pantoprazole  40 mg Oral Daily  . polyethylene glycol  17 g Oral Daily  . senna-docusate  1 tablet Oral BID  . sodium chloride flush  10-40 mL Intracatheter Q12H    Continuous Infusions:    PRN Meds: acetaminophen **OR** acetaminophen, alum & mag hydroxide-simeth, gelatin adsorbable, LORazepam, oxyCODONE **OR** morphine injection, ondansetron **OR** ondansetron (ZOFRAN) IV  Physical Exam         General: Awake and alert HEENT: No bruits, no goiter, no JVD Heart: Tachycardic. No murmur appreciated. Lungs: Good air movement, clear Abdomen: Soft, nontender, nondistended, positive bowel sounds.   Ext: No significant edema Skin: Warm and dry, left hand with dressing in place.  Area of right breast  partially exposed from bandage and is a deep chest wound with erosion and necrotic tissue all the way down to the chest wall Neuro: Grossly intact, nonfocal.  Vital Signs: BP 117/75 (BP Location: Left Arm)   Pulse 94   Temp 97.7 F (36.5 C) (Oral)   Resp 18   Ht 5\' 1"  (1.549 m)   Wt 68 kg   SpO2 100%   BMI 28.34 kg/m  SpO2: SpO2: 100 % O2 Device: O2 Device: Nasal  Cannula O2 Flow Rate: O2 Flow Rate (L/min): 2 L/min  Intake/output summary:  Intake/Output Summary (Last 24 hours) at 06/22/2021 1233 Last data filed at 06/21/2021 2122 Gross per 24 hour  Intake 10 ml  Output --  Net 10 ml    LBM:   Baseline Weight: Weight: 68 kg Most recent weight: Weight:  (pt refused)       Palliative Assessment/Data:    Flowsheet Rows    Flowsheet Row Most Recent Value  Intake Tab   Referral Department Hospitalist  Unit at Time of Referral Med/Surg Unit  Palliative Care Primary Diagnosis Cancer  Date Notified 06/17/21  Palliative Care Type New Palliative care  Reason for referral Clarify Goals of Care  Date of Admission 06/17/21  Date first seen by Palliative Care 06/18/21  # of days Palliative referral response time 1 Day(s)  # of days IP prior to Palliative referral 0  Clinical Assessment   Palliative Performance Scale Score 40%  Psychosocial & Spiritual Assessment   Palliative Care Outcomes   Patient/Family meeting held? Yes  Who was at the meeting? Patient       Patient Active Problem List   Diagnosis Date Noted  . Pressure injury of skin 06/22/2021  . Severe anemia 06/18/2021  . Symptomatic anemia 06/17/2021  . GERD without esophagitis 06/17/2021  . Cellulitis of finger of left hand 06/17/2021  . Anasarca 06/17/2021  . Neutropenic fever ([place]) 08/14/2020  . Sepsis ([place]) 08/14/2020  . Acute lower UTI 08/14/2020  . Open chest wound, right, initial encounter 08/14/2020  . Anemia associated with chemotherapy 08/14/2020  . Neuropathy due to chemotherapeutic drug ([place])  07/10/2019  . Recurrent breast adenocarcinoma ([place]) 07/02/2019  . Bone metastases ([place]) 07/02/2019  . Goals of care, counseling/discussion 07/02/2019  . Morbid obesity with body mass index (BMI) of 40.0 to 44.9 in adult ([place]) 04/10/2018  . Genetic testing 11/20/2016  . Family history of breast cancer 11/20/2016  . Malignant neoplasm of upper-outer quadrant of right breast in female, estrogen receptor negative ([place]) 02/04/2016  . Hirsutism 01/13/2016  . Tobacco dependence 01/13/2016  . Irregular periods/menstrual cycles 01/13/2016    Palliative Care Assessment & Plan   Patient Profile: 45 y.o. female  with past medical history of metastatic breast cancer diagnosed in 2017 admitted on 06/17/2021 with generalized weakness and chest wall pain.  Plan was for admission to hospice after seeing Dr. [name] on 7/14 but she got worse and presented to ED before this can be arranged.  Also noted to have cellulitis of her hand on the left.  Palliative consulted for goals of care.  Recommendations/Plan: Full code/full scope Eventual plan for transition home with hospice once final plan in place regarding care of hand and plan for radiation. Appreciate rad onc input, patient has undergone simulation, she anticipates starting radiation tomorrow. Continue current mode of care, med history noted.      Code Status:    Code Status Orders  (From admission, onward)           Start     Ordered   06/17/21 2101  Full code  Continuous        06/17/21 2103           Code Status History     Date Active Date Inactive Code Status Order ID Comments User Context   08/14/2020 1938 08/17/2020 2015 Full Code [id number]  Kathie Dike, MD ED   09/06/2016 1430 09/07/2016 1211 Full Code [id number]  Alphonsa Overall, MD Inpatient  04/11/2016 1313 04/12/2016 0319 Full Code [id number]  Arne Cleveland, MD HOV       Prognosis:  < 6 months  Discharge Planning: Home with Hospice  Care plan was discussed with  patient Thank you for allowing the Palliative Medicine Team to assist in the care of this patient.  Total Time 25 Prolonged Time Billed No   Greater than 50%  of this time was spent counseling and coordinating care related to the above assessment and plan.  Loistine Chance, MD  Please contact Palliative Medicine Team phone at [phone] for questions and concerns.

## 2021-06-22 NOTE — Progress Notes (Signed)
PROGRESS NOTE    Rebecca [name]  WUJ:[id number] DOB: 18-Sep-1976 DOA: 06/17/2021 PCP: Dorena Dew, FNP   Chief Complaint  Patient presents with   Nausea   Bleeding/Bruising    Bleeding from tumor from breast cancer   Brief Narrative: 45 year old female with metastatic breast cancer diagnosed 2017, with mets to bone and chest wall GERD presented to the ED with generalized weakness, chest wall pain. As per the report patient was getting her cancer treatment under the care of [facility] in [place] and has suggested all treatment modalities and was referred for home hospice about 2 weeks ago .  She was referred for hospice with [facility] after patient contacted Dr. [name] office on 7/14 Patient complains of progressively worsening generalized weakness, also having bleeding from her wounds in the right chest wall and left axilla. Seen in the ED and found to have severe anemia and admitted. She was also found to have cellulitis abscess on the index finger of the left hand-underwent or an I&D 7/16. Repeated episode of bleeding on 7/17- tachycardic, hb donw to 6.3, give bolus iv and 2 unit prbc.CCS saw patient. Radiation oncologist was consulted for radiation of the breast to prevent bleeding  Subjective: Seen this morning appears pleasant no new complaints.  Tachycardia has improved with Lopressor.   No bleeding from the wound.  Assessment & Plan:  Symptomatic severe anemia Anemia of chronic disease/cancer  and anemia from chronic blood loss from breast cancer Total 4 units PRBC, hb stable now. Unfortunately will continue to have ongoing bleeding oozing from her cancer site, surgery has seen, could try Surgifoam dressing if needed, cont local pressure dressing, being evaluated radiation oncology-noted plan to start radiation patient reports it will start Wednesday.  Recent Labs  Lab 06/18/21 0809 06/18/21 1523 06/19/21 0315 06/20/21 1450 06/21/21 0339   HGB 6.4* 8.5* 8.0* 6.3* 9.0*  HCT 18.6* 25.6* 24.6* 19.7* 27.6*    Cellulitis/abscess of fifth finger on left hand: S/p "Left small finger flexor sheath incision and drainage tendon sheath. Left small finger and hand drainage of complicated abscess deep space abscess".  Dressings on yesterday continue dressing as per orthopedics, culture with MRSA we will keep on doxycycline, stop rocephin.   Sinus tachycardia suspect multifactorial due to metastatic cancer, anemia.  Anemia has improved with transfusion.  Heart rate improved Lopressor.  Continue the same   Leucocytosis:in the setting of cellulitis/malignancy.  Monitor GERD-on ppi. Mild elevated NFA:OZHYQMV. Thrombocytopenia in the setting of metastatic disease.  Hypomagnesemia-repleted Hypokalemia repleted.  Malignant neoplasm of upper-outer quadrant of right breast in female, estrogen receptor negative metastatic disease: Poor prognosis appears patient has referred for home with hospice after she exhausted all chemotherapeutic options.  Radiation oncology planning for XRT to help with bleeding.    Anasarca: Peripheral edema with severe hypoalbuminemia anemia likely from poor intake, in the setting of metastatic cancer, supportive measures.  Extensive right chest wall wound as well as left axillary wound Wound care to continue as she allows. Goals of care, counseling/discussion: Had a frank discussion and she agreed for no chest compression but wants rest of the aggressive care including intubation.  Palliative care following closely.  Plan is for home with hospice.   Patient refusing tele this am.  Diet Order             Diet regular Room service appropriate? Yes; Fluid consistency: Thin  Diet effective now  Patient's Body mass index is 28.34 kg/m.  DVT prophylaxis: SCD's Start: 06/19/21 2029 SCDs Start: 06/17/21 2101 Code Status:   Code Status: Partial Code  Family Communication: plan of care discussed with  patient and her daughter at bedside. Status is: Inpatient Remains inpatient for ongoing management of bleeding from the breast cancer and anemia  Dispo: The patient is from: Home              Anticipated d/c is to:  Home with hospice  after XRT.              Patient currently is not medically stable to d/c.   Difficult to place patient No   Medications reviewed: Scheduled Meds:  (feeding supplement) PROSource Plus  30 mL Oral BID BM   sodium chloride   Intravenous Once   sodium chloride   Intravenous Once   Chlorhexidine Gluconate Cloth  6 each Topical Daily   doxycycline  100 mg Oral Q12H   gabapentin  300 mg Oral TID   mouth rinse  15 mL Mouth Rinse BID   methadone  5 mg Oral Q8H   metoprolol tartrate  25 mg Oral BID   Metronidazole (Flagyl) 500mg  tablet  1,000 mg Topical Daily   nutrition supplement (JUVEN)  1 packet Oral BID BM   pantoprazole  40 mg Oral Daily   polyethylene glycol  17 g Oral Daily   senna-docusate  1 tablet Oral BID   sodium chloride flush  10-40 mL Intracatheter Q12H   Continuous Infusions:   Consultants:see note  Procedures:see note Antimicrobials: Anti-infectives (From admission, onward)    Start     Dose/Rate Route Frequency Ordered Stop   06/19/21 1100  doxycycline (VIBRA-TABS) tablet 100 mg        100 mg Oral Every 12 hours 06/19/21 1013     06/18/21 1400  metroNIDAZOLE (FLAGYL) tablet 500 mg  Status:  Discontinued        500 mg Oral Daily 06/18/21 1310 06/18/21 1323   06/17/21 2200  cefTRIAXone (ROCEPHIN) 1 g in sodium chloride 0.9 % 100 mL IVPB  Status:  Discontinued        1 g 200 mL/hr over 30 Minutes Intravenous Every 24 hours 06/17/21 2121 06/22/21 1011   06/17/21 1745  doxycycline (VIBRAMYCIN) 100 mg in sodium chloride 0.9 % 250 mL IVPB        100 mg 125 mL/hr over 120 Minutes Intravenous  Once 06/17/21 1739 06/17/21 2052   06/17/21 0000  doxycycline (VIBRAMYCIN) 100 MG capsule        100 mg Oral 2 times daily 06/17/21 1747         Culture/Microbiology    Component Value Date/Time   SDES  06/19/2021 1853    ABSCESS Performed at [facility], [place] [address], [place], [place] [address]    SPECREQUEST  06/19/2021 1853    LEFT HAND Performed at [facility], [place] [address], [place], [place] [address]    CULT  06/19/2021 1853    FEW METHICILLIN RESISTANT STAPHYLOCOCCUS AUREUS NO ANAEROBES ISOLATED; CULTURE IN PROGRESS FOR 5 DAYS    REPTSTATUS PENDING 06/19/2021 1853    Other culture-see note  Objective: Vitals: Today's Vitals   06/22/21 0530 06/22/21 0548 06/22/21 0600 06/22/21 0848  BP:  122/79    Pulse:  (!) 109    Resp:  16    Temp:  98.7 F (37.1 C)    TempSrc:  Oral  SpO2:  98%    Weight:      Height:      PainSc: 7   3  0-No pain    Intake/Output Summary (Last 24 hours) at 06/22/2021 1102 Last data filed at 06/21/2021 2122 Gross per 24 hour  Intake 130 ml  Output --  Net 130 ml    Filed Weights   06/17/21 1724  Weight: 68 kg   Weight change:   Intake/Output from previous day: 07/18 0701 - 07/19 0700 In: 130 [P.O.:120; I.V.:10] Out: -  Intake/Output this shift: No intake/output data recorded. Filed Weights   06/17/21 1724  Weight: 68 kg   Examination: General exam: AAOx 3, older for age, pleasant.   HEENT:Oral mucosa moist, Ear/Nose WNL grossly, dentition normal. Respiratory system: bilaterally diminished, ,no use of accessory muscle.  Right chest wall with extensive tissue loss fungating mass visible rib/extremal lung movement when dressing if off. Has dressing now. Cardiovascular system: S1 & S2 +, No JVD,. Gastrointestinal system: Abdomen soft, NT,ND, BS+ Nervous System:Alert, awake, moving extremities and grossly nonfocal Extremities: distal peripheral pulses palpable.  Skin: No rashes,no icterus. MSK: Normal muscle bulk,tone, power    Data Reviewed: I have personally reviewed following labs and imaging studies CBC: Recent Labs   Lab 06/17/21 1850 06/18/21 0809 06/18/21 1523 06/19/21 0315 06/20/21 1450 06/21/21 0339  WBC 21.2* 15.3* 20.3* 16.7* 14.1*  --   NEUTROABS 18.9* 12.7*  --   --   --   --   HGB 5.2* 6.4* 8.5* 8.0* 6.3* 9.0*  HCT 16.3* 18.6* 25.6* 24.6* 19.7* 27.6*  MCV 85.3 85.3 84.8 85.4 87.9  --   PLT 158 130* 150 134* 135*  --     Basic Metabolic Panel: Recent Labs  Lab 06/17/21 1850 06/18/21 0809 06/20/21 1450  NA 137 136 138  K 4.4 4.5 3.2*  CL 97* 99 105  CO2 24 28 25   GLUCOSE 101* 85 105*  BUN 15 16 10   CREATININE 1.17* 1.05* 0.79  CALCIUM 7.9* 7.3* 7.4*  MG  --  1.1*  --     GFR: Estimated Creatinine Clearance: 76.7 mL/min (by C-G formula based on SCr of 0.79 mg/dL). Liver Function Tests: Recent Labs  Lab 06/17/21 1850 06/18/21 0809  AST 52* 75*  ALT 20 20  ALKPHOS 192* 166*  BILITOT 0.5 0.6  PROT 5.8* 5.2*  ALBUMIN 2.2* 2.0*    No results for input(s): LIPASE, AMYLASE in the last 168 hours. No results for input(s): AMMONIA in the last 168 hours. Coagulation Profile: Recent Labs  Lab 06/18/21 0809 06/20/21 1450  INR 1.5* 1.6*    Cardiac Enzymes: No results for input(s): CKTOTAL, CKMB, CKMBINDEX, TROPONINI in the last 168 hours. BNP (last 3 results) No results for input(s): PROBNP in the last 8760 hours. HbA1C: No results for input(s): HGBA1C in the last 72 hours. CBG: No results for input(s): GLUCAP in the last 168 hours. Lipid Profile: No results for input(s): CHOL, HDL, LDLCALC, TRIG, CHOLHDL, LDLDIRECT in the last 72 hours. Thyroid Function Tests: No results for input(s): TSH, T4TOTAL, FREET4, T3FREE, THYROIDAB in the last 72 hours. Anemia Panel: No results for input(s): VITAMINB12, FOLATE, FERRITIN, TIBC, IRON, RETICCTPCT in the last 72 hours. Sepsis Labs: No results for input(s): PROCALCITON, LATICACIDVEN in the last 168 hours.  Recent Results (from the past 240 hour(s))  Resp Panel by RT-PCR (Flu A&B, Covid) Nasopharyngeal Swab     Status: None    Collection Time: 06/17/21  9:47 PM  Specimen: Nasopharyngeal Swab; Nasopharyngeal(NP) swabs in vial transport medium  Result Value Ref Range Status   SARS Coronavirus 2 by RT PCR NEGATIVE NEGATIVE Final    Comment: (NOTE) SARS-CoV-2 target nucleic acids are NOT DETECTED.  The SARS-CoV-2 RNA is generally detectable in upper respiratory specimens during the acute phase of infection. The lowest concentration of SARS-CoV-2 viral copies this assay can detect is 138 copies/mL. A negative result does not preclude SARS-Cov-2 infection and should not be used as the sole basis for treatment or other patient management decisions. A negative result may occur with  improper specimen collection/handling, submission of specimen other than nasopharyngeal swab, presence of viral mutation(s) within the areas targeted by this assay, and inadequate number of viral copies(<138 copies/mL). A negative result must be combined with clinical observations, patient history, and epidemiological information. The expected result is Negative.  Fact Sheet for Patients:  EntrepreneurPulse.com.au  Fact Sheet for Healthcare Providers:  IncredibleEmployment.be  This test is no t yet approved or cleared by the [place] FDA and  has been authorized for detection and/or diagnosis of SARS-CoV-2 by FDA under an Emergency Use Authorization (EUA). This EUA will remain  in effect (meaning this test can be used) for the duration of the COVID-19 declaration under Section 564(b)(1) of the Act, 21 U.S.C.section 360bbb-3(b)(1), unless the authorization is terminated  or revoked sooner.       Influenza A by PCR NEGATIVE NEGATIVE Final   Influenza B by PCR NEGATIVE NEGATIVE Final    Comment: (NOTE) The Xpert Xpress SARS-CoV-2/FLU/RSV plus assay is intended as an aid in the diagnosis of influenza from Nasopharyngeal swab specimens and should not be used as a sole basis for treatment.  Nasal washings and aspirates are unacceptable for Xpert Xpress SARS-CoV-2/FLU/RSV testing.  Fact Sheet for Patients: EntrepreneurPulse.com.au  Fact Sheet for Healthcare Providers: IncredibleEmployment.be  This test is not yet approved or cleared by the [place] FDA and has been authorized for detection and/or diagnosis of SARS-CoV-2 by FDA under an Emergency Use Authorization (EUA). This EUA will remain in effect (meaning this test can be used) for the duration of the COVID-19 declaration under Section 564(b)(1) of the Act, 21 U.S.C. section 360bbb-3(b)(1), unless the authorization is terminated or revoked.  Performed at [facility], [place] [address], [place], [place] [address]   Culture, blood (routine x 2)     Status: None (Preliminary result)   Collection Time: 06/18/21 12:22 AM   Specimen: BLOOD  Result Value Ref Range Status   Specimen Description   Final    BLOOD LEFT ANTECUBITAL Performed at [place] [address], [place], [place] [address]    Special Requests   Final    BOTTLES DRAWN AEROBIC ONLY Blood Culture adequate volume Performed at [place] [facility] [address] [place], [place] [address]    Culture   Final    NO GROWTH 4 DAYS Performed at [facility], [place] [address], [place], [place] [address]    Report Status PENDING  Incomplete  Culture, blood (routine x 2)     Status: None (Preliminary result)   Collection Time: 06/18/21 12:22 AM   Specimen: BLOOD  Result Value Ref Range Status   Specimen Description   Final    BLOOD BLOOD LEFT HAND Performed at [place] [address] [place], [place] [address]    Special Requests   Final    BOTTLES DRAWN AEROBIC ONLY Blood Culture adequate  volume Performed at [facility], [place] [address], [place], [place] [address]    Culture   Final    NO GROWTH 4  DAYS Performed at [facility], [place] [address], [place], [place] [address]    Report Status PENDING  Incomplete  Surgical pcr screen     Status: None   Collection Time: 06/19/21  6:08 PM   Specimen: Nasal Mucosa; Nasal Swab  Result Value Ref Range Status   MRSA, PCR NEGATIVE NEGATIVE Final   Staphylococcus aureus NEGATIVE NEGATIVE Final    Comment: (NOTE) The Xpert SA Assay (FDA approved for NASAL specimens in patients 44 years of age and older), is one component of a comprehensive surveillance program. It is not intended to diagnose infection nor to guide or monitor treatment. Performed at [facility], [place] [address] [place], [place] [address]   Aerobic/Anaerobic Culture w Gram Stain (surgical/deep wound)     Status: None (Preliminary result)   Collection Time: 06/19/21  6:53 PM   Specimen: PATH Other; Tissue  Result Value Ref Range Status   Specimen Description   Final    ABSCESS Performed at [place] [address], [place], [place] [address]    Special Requests   Final    LEFT HAND Performed at [facility], [place] [address], [place], [place] [address]    Gram Stain   Final    MODERATE WBC PRESENT, PREDOMINANTLY PMN MODERATE GRAM POSITIVE COCCI IN CLUSTERS Performed at [facility], [place] [address] [place], [place] [address]    Culture   Final    FEW METHICILLIN RESISTANT STAPHYLOCOCCUS AUREUS NO ANAEROBES ISOLATED; CULTURE IN PROGRESS FOR 5 DAYS    Report Status PENDING  Incomplete   Organism ID, Bacteria METHICILLIN RESISTANT STAPHYLOCOCCUS AUREUS  Final      Susceptibility   Methicillin resistant staphylococcus aureus - MIC*    CIPROFLOXACIN <=0.5 SENSITIVE Sensitive     ERYTHROMYCIN >=8 RESISTANT Resistant     GENTAMICIN <=0.5 SENSITIVE Sensitive     OXACILLIN >=4 RESISTANT Resistant     TETRACYCLINE <=1 SENSITIVE Sensitive     VANCOMYCIN <=0.5 SENSITIVE Sensitive     TRIMETH/SULFA <=10  SENSITIVE Sensitive     CLINDAMYCIN <=0.25 SENSITIVE Sensitive     RIFAMPIN <=0.5 SENSITIVE Sensitive     Inducible Clindamycin NEGATIVE Sensitive     * FEW METHICILLIN RESISTANT STAPHYLOCOCCUS AUREUS     Radiology Studies: No results found.   LOS: 4 days   Antonieta Pert, MD [facility]  06/22/2021, 11:02 AM

## 2021-06-23 ENCOUNTER — Ambulatory Visit
Admit: 2021-06-23 | Discharge: 2021-06-23 | Disposition: A | Discharge status: Home / Self Care | Payer: Medicaid Other | Attending: Radiation Oncology | Admitting: Radiation Oncology

## 2021-06-23 DIAGNOSIS — D649 Anemia, unspecified: Secondary | ICD-10-CM | POA: Diagnosis not present

## 2021-06-23 DIAGNOSIS — C50411 Malignant neoplasm of upper-outer quadrant of right female breast: Secondary | ICD-10-CM | POA: Diagnosis not present

## 2021-06-23 LAB — CULTURE, BLOOD (ROUTINE X 2)
Culture: NO GROWTH
Culture: NO GROWTH
Special Requests: ADEQUATE
Special Requests: ADEQUATE

## 2021-06-23 LAB — PREPARE RBC (CROSSMATCH)

## 2021-06-23 LAB — HEMOGLOBIN AND HEMATOCRIT, BLOOD
HCT: 23.5 % — ABNORMAL LOW (ref 36.0–46.0)
Hemoglobin: 7.6 g/dL — ABNORMAL LOW (ref 12.0–15.0)

## 2021-06-23 MED ORDER — METOPROLOL TARTRATE 25 MG PO TABS
12.5000 mg | ORAL_TABLET | Freq: Two times a day (BID) | ORAL | Status: DC
  Administered 2021-06-23 – 2021-06-26 (×7): 12.5 mg via ORAL
  Filled 2021-06-23 (×7): qty 1

## 2021-06-23 MED ORDER — SODIUM CHLORIDE 0.9% IV SOLUTION: Freq: Once | INTRAVENOUS | Status: AC

## 2021-06-23 NOTE — Progress Notes (Signed)
PROGRESS NOTE    Rebecca [name]  TIR:[id number] DOB: [birth year] DOA: 06/17/2021 PCP: Dorena Dew, FNP   Chief Complaint  Patient presents with   Nausea   Bleeding/Bruising    Bleeding from tumor from breast cancer   Brief Narrative: 45 year old female with metastatic breast cancer diagnosed 2017, with mets to bone and chest wall GERD presented to the ED with generalized weakness, chest wall pain. As per the report patient was getting her cancer treatment under the care of [facility] in [place] and has suggested all treatment modalities and was referred for home hospice about 2 weeks ago .  She was referred for hospice with [facility] after patient contacted Dr. [name] office on 7/14 Patient complains of progressively worsening generalized weakness, also having bleeding from her wounds in the right chest wall and left axilla. Seen in the ED and found to have severe anemia and admitted. She was also found to have cellulitis abscess on the index finger of the left hand-underwent or an I&D 7/16. Repeated episode of bleeding on 7/17- tachycardic, hb donw to 6.3, give bolus iv and 2 unit prbc.CCS saw patient. Radiation oncologist was consulted for radiation of the breast to prevent bleeding  Subjective: Hemoglobin further down at 7.6 g this morning.  Patient has no new complaints awaiting for palliative radiation therapy.  Assessment & Plan:  Symptomatic severe anemia Anemia of chronic disease/cancer  and anemia from chronic blood loss from breast cancer Total 4 units PRBC, hb stable now.  Hemoglobin further downtrending will transfuse unit PRBC.  Seen by surgery oncology radiation oncology and planning for palliative radiotherapy.   Recent Labs  Lab 06/18/21 1523 06/19/21 0315 06/20/21 1450 06/21/21 0339 06/23/21 0840  HGB 8.5* 8.0* 6.3* 9.0* 7.6*  HCT 25.6* 24.6* 19.7* 27.6* 23.5*   MRSA Cellulitis/abscess of fifth finger on left hand: s/p "Left  small finger flexor sheath incision and drainage tendon sheath. Left small finger and hand drainage of complicated abscess deep space abscess".  Continue dressing change, pain is much better able to flex her left index finger.  Continue doxycycline.    Sinus tachycardia suspect multifactorial due to metastatic cancer, anemia.  Anemia has improved with transfusion.  Heart rate better blood pressure soft cut down metoprolol dose to 12.5   Leucocytosis:in the setting of cellulitis/malignancy.  On antibiotics GERD-on ppi. Mild elevated QPY:PPJKDTO. Thrombocytopenia in the setting of metastatic disease.  Hypomagnesemia-repleted Hypokalemia repleted.  Malignant neoplasm of upper-outer quadrant of right breast in female, estrogen receptor negative metastatic disease: Poor prognosis appears patient has referred for home with hospice after she exhausted all chemotherapeutic options.  Radiation oncology planning for palliative radiotherapy to help with the bleeding .    Anasarca: Peripheral edema with severe hypoalbuminemia anemia likely from poor intake, in the setting of metastatic cancer, supportive measures.  Extensive right chest wall wound as well as left axillary wound Wound care to continue as she allows. Goals of care, counseling/discussion: Had a frank discussion and she agreed for no chest compression but wants rest of the aggressive care including intubation.  Palliative care following closely.  Plan is for home with hospice once getting radiation therapy  Diet Order             Diet regular Room service appropriate? Yes; Fluid consistency: Thin  Diet effective now                   Patient's Body mass index is 28.34 kg/m.  DVT  prophylaxis: SCD's Start: 06/19/21 2029 SCDs Start: 06/17/21 2101 Code Status:   Code Status: Partial Code  Family Communication: plan of care discussed with patient and her daughter at bedside. Status is: Inpatient Remains inpatient for ongoing  management of bleeding from the breast cancer and anemia  Dispo: The patient is from: Home              Anticipated d/c is to:  Home with hospice  after XRT.              Patient currently is not medically stable to d/c.   Difficult to place patient No   Medications reviewed: Scheduled Meds:  (feeding supplement) PROSource Plus  30 mL Oral BID BM   sodium chloride   Intravenous Once   sodium chloride   Intravenous Once   sodium chloride   Intravenous Once   Chlorhexidine Gluconate Cloth  6 each Topical Daily   doxycycline  100 mg Oral Q12H   gabapentin  300 mg Oral TID   mouth rinse  15 mL Mouth Rinse BID   methadone  5 mg Oral Q8H   metoprolol tartrate  12.5 mg Oral BID   Metronidazole (Flagyl) 500mg  tablet  1,000 mg Topical Daily   nutrition supplement (JUVEN)  1 packet Oral BID BM   pantoprazole  40 mg Oral Daily   polyethylene glycol  17 g Oral Daily   senna-docusate  1 tablet Oral BID   sodium chloride flush  10-40 mL Intracatheter Q12H   Continuous Infusions:   Consultants:see note  Procedures:see note Antimicrobials: Anti-infectives (From admission, onward)    Start     Dose/Rate Route Frequency Ordered Stop   06/19/21 1100  doxycycline (VIBRA-TABS) tablet 100 mg        100 mg Oral Every 12 hours 06/19/21 1013     06/18/21 1400  metroNIDAZOLE (FLAGYL) tablet 500 mg  Status:  Discontinued        500 mg Oral Daily 06/18/21 1310 06/18/21 1323   06/17/21 2200  cefTRIAXone (ROCEPHIN) 1 g in sodium chloride 0.9 % 100 mL IVPB  Status:  Discontinued        1 g 200 mL/hr over 30 Minutes Intravenous Every 24 hours 06/17/21 2121 06/22/21 1011   06/17/21 1745  doxycycline (VIBRAMYCIN) 100 mg in sodium chloride 0.9 % 250 mL IVPB        100 mg 125 mL/hr over 120 Minutes Intravenous  Once 06/17/21 1739 06/17/21 2052   06/17/21 0000  doxycycline (VIBRAMYCIN) 100 MG capsule        100 mg Oral 2 times daily 06/17/21 1747        Culture/Microbiology    Component Value  Date/Time   SDES  06/19/2021 1853    ABSCESS Performed at [facility], [place] [address] [place], [place] [address]    SPECREQUEST  06/19/2021 1853    LEFT HAND Performed at [facility], [place] [address], [place], [place] [address]    CULT  06/19/2021 1853    FEW METHICILLIN RESISTANT STAPHYLOCOCCUS AUREUS NO ANAEROBES ISOLATED; CULTURE IN PROGRESS FOR 5 DAYS    REPTSTATUS PENDING 06/19/2021 1853    Other culture-see note  Objective: Vitals: Today's Vitals   06/23/21 0002 06/23/21 0425 06/23/21 0447 06/23/21 0532  BP:  98/60    Pulse:  90    Resp:  16    Temp:  98.7 F (37.1 C)    TempSrc:  Oral    SpO2:  100%    Weight:      Height:      PainSc: 3   8  3      Intake/Output Summary (Last 24 hours) at 06/23/2021 1143 Last data filed at 06/23/2021 0448 Gross per 24 hour  Intake 10 ml  Output --  Net 10 ml    Filed Weights   06/17/21 1724  Weight: 68 kg   Weight change:   Intake/Output from previous day: 07/19 0701 - 07/20 0700 In: 10 [I.V.:10] Out: -  Intake/Output this shift: No intake/output data recorded. Filed Weights   06/17/21 1724  Weight: 68 kg   Examination: General exam: AAOx 3, pleasant HEENT:Oral mucosa moist, Ear/Nose WNL grossly, dentition normal. Respiratory system: bilaterally diminished, , no use of accessory muscle.  Extensive chest wall wound with dressing in place with breast cancer mass Cardiovascular system: S1 & S2 +, No JVD,. Gastrointestinal system: Abdomen soft,NT,ND, BS+ Nervous System:Alert, awake, moving extremities and grossly nonfocal Extremities: no edema, distal peripheral pulses palpable.  Skin: No rashes,no icterus. MSK: Normal muscle bulk,tone, power    Data Reviewed: I have personally reviewed following labs and imaging studies CBC: Recent Labs  Lab 06/17/21 1850 06/18/21 0809 06/18/21 1523 06/19/21 0315 06/20/21 1450 06/21/21 0339 06/23/21 0840  WBC 21.2* 15.3* 20.3*  16.7* 14.1*  --   --   NEUTROABS 18.9* 12.7*  --   --   --   --   --   HGB 5.2* 6.4* 8.5* 8.0* 6.3* 9.0* 7.6*  HCT 16.3* 18.6* 25.6* 24.6* 19.7* 27.6* 23.5*  MCV 85.3 85.3 84.8 85.4 87.9  --   --   PLT 158 130* 150 134* 135*  --   --     Basic Metabolic Panel: Recent Labs  Lab 06/17/21 1850 06/18/21 0809 06/20/21 1450  NA 137 136 138  K 4.4 4.5 3.2*  CL 97* 99 105  CO2 24 28 25   GLUCOSE 101* 85 105*  BUN 15 16 10   CREATININE 1.17* 1.05* 0.79  CALCIUM 7.9* 7.3* 7.4*  MG  --  1.1*  --     GFR: Estimated Creatinine Clearance: 76.7 mL/min (by C-G formula based on SCr of 0.79 mg/dL). Liver Function Tests: Recent Labs  Lab 06/17/21 1850 06/18/21 0809  AST 52* 75*  ALT 20 20  ALKPHOS 192* 166*  BILITOT 0.5 0.6  PROT 5.8* 5.2*  ALBUMIN 2.2* 2.0*    No results for input(s): LIPASE, AMYLASE in the last 168 hours. No results for input(s): AMMONIA in the last 168 hours. Coagulation Profile: Recent Labs  Lab 06/18/21 0809 06/20/21 1450  INR 1.5* 1.6*    Cardiac Enzymes: No results for input(s): CKTOTAL, CKMB, CKMBINDEX, TROPONINI in the last 168 hours. BNP (last 3 results) No results for input(s): PROBNP in the last 8760 hours. HbA1C: No results for input(s): HGBA1C in the last 72 hours. CBG: No results for input(s): GLUCAP in the last 168 hours. Lipid Profile: No results for input(s): CHOL, HDL, LDLCALC, TRIG, CHOLHDL, LDLDIRECT in the last 72 hours. Thyroid Function Tests: No results for input(s): TSH, T4TOTAL, FREET4, T3FREE, THYROIDAB in the last 72 hours. Anemia Panel: No results for input(s): VITAMINB12, FOLATE, FERRITIN, TIBC, IRON, RETICCTPCT in the last 72 hours. Sepsis Labs: No results for input(s): PROCALCITON, LATICACIDVEN in the last 168 hours.  Recent Results (from the past 240 hour(s))  Resp Panel by RT-PCR (Flu A&B, Covid) Nasopharyngeal Swab     Status: None   Collection Time: 06/17/21  9:47 PM  Specimen: Nasopharyngeal Swab;  Nasopharyngeal(NP) swabs in vial transport medium  Result Value Ref Range Status   SARS Coronavirus 2 by RT PCR NEGATIVE NEGATIVE Final    Comment: (NOTE) SARS-CoV-2 target nucleic acids are NOT DETECTED.  The SARS-CoV-2 RNA is generally detectable in upper respiratory specimens during the acute phase of infection. The lowest concentration of SARS-CoV-2 viral copies this assay can detect is 138 copies/mL. A negative result does not preclude SARS-Cov-2 infection and should not be used as the sole basis for treatment or other patient management decisions. A negative result may occur with  improper specimen collection/handling, submission of specimen other than nasopharyngeal swab, presence of viral mutation(s) within the areas targeted by this assay, and inadequate number of viral copies(<138 copies/mL). A negative result must be combined with clinical observations, patient history, and epidemiological information. The expected result is Negative.  Fact Sheet for Patients:  EntrepreneurPulse.com.au  Fact Sheet for Healthcare Providers:  IncredibleEmployment.be  This test is no t yet approved or cleared by the [place] FDA and  has been authorized for detection and/or diagnosis of SARS-CoV-2 by FDA under an Emergency Use Authorization (EUA). This EUA will remain  in effect (meaning this test can be used) for the duration of the COVID-19 declaration under Section 564(b)(1) of the Act, 21 U.S.C.section 360bbb-3(b)(1), unless the authorization is terminated  or revoked sooner.       Influenza A by PCR NEGATIVE NEGATIVE Final   Influenza B by PCR NEGATIVE NEGATIVE Final    Comment: (NOTE) The Xpert Xpress SARS-CoV-2/FLU/RSV plus assay is intended as an aid in the diagnosis of influenza from Nasopharyngeal swab specimens and should not be used as a sole basis for treatment. Nasal washings and aspirates are unacceptable for Xpert Xpress  SARS-CoV-2/FLU/RSV testing.  Fact Sheet for Patients: EntrepreneurPulse.com.au  Fact Sheet for Healthcare Providers: IncredibleEmployment.be  This test is not yet approved or cleared by the [place] FDA and has been authorized for detection and/or diagnosis of SARS-CoV-2 by FDA under an Emergency Use Authorization (EUA). This EUA will remain in effect (meaning this test can be used) for the duration of the COVID-19 declaration under Section 564(b)(1) of the Act, 21 U.S.C. section 360bbb-3(b)(1), unless the authorization is terminated or revoked.  Performed at [facility], [place] [address] [place], [place] [address]   Culture, blood (routine x 2)     Status: None   Collection Time: 06/18/21 12:22 AM   Specimen: BLOOD  Result Value Ref Range Status   Specimen Description   Final    BLOOD LEFT ANTECUBITAL Performed at [place] [address] [place], [place] [address]    Special Requests   Final    BOTTLES DRAWN AEROBIC ONLY Blood Culture adequate volume Performed at [place] [address], [place], [place] [address]    Culture   Final    NO GROWTH 5 DAYS Performed at [facility], [place] [address], [place], [place] [address]    Report Status 06/23/2021 FINAL  Final  Culture, blood (routine x 2)     Status: None   Collection Time: 06/18/21 12:22 AM   Specimen: BLOOD  Result Value Ref Range Status   Specimen Description   Final    BLOOD BLOOD LEFT HAND Performed at [place] [address] [place],  [address]    Special Requests   Final    BOTTLES DRAWN AEROBIC ONLY Blood Culture adequate volume Performed  at [facility], [place] [address] [place], [place] [address]    Culture   Final    NO GROWTH 5 DAYS Performed at [facility], [place] [address], [place], [place] [address]    Report Status 06/23/2021  FINAL  Final  Surgical pcr screen     Status: None   Collection Time: 06/19/21  6:08 PM   Specimen: Nasal Mucosa; Nasal Swab  Result Value Ref Range Status   MRSA, PCR NEGATIVE NEGATIVE Final   Staphylococcus aureus NEGATIVE NEGATIVE Final    Comment: (NOTE) The Xpert SA Assay (FDA approved for NASAL specimens in patients 36 years of age and older), is one component of a comprehensive surveillance program. It is not intended to diagnose infection nor to guide or monitor treatment. Performed at [facility], [place] [address] [place], [place] [address]   Aerobic/Anaerobic Culture w Gram Stain (surgical/deep wound)     Status: None (Preliminary result)   Collection Time: 06/19/21  6:53 PM   Specimen: PATH Other; Tissue  Result Value Ref Range Status   Specimen Description   Final    ABSCESS Performed at [facility] [address] [place], [place] [address]    Special Requests   Final    LEFT HAND Performed at [facility], [place] [address], [place], [place] [address]    Gram Stain   Final    MODERATE WBC PRESENT, PREDOMINANTLY PMN MODERATE GRAM POSITIVE COCCI IN CLUSTERS Performed at [facility], [place] [address], [place], [place] [address]    Culture   Final    FEW METHICILLIN RESISTANT STAPHYLOCOCCUS AUREUS NO ANAEROBES ISOLATED; CULTURE IN PROGRESS FOR 5 DAYS    Report Status PENDING  Incomplete   Organism ID, Bacteria METHICILLIN RESISTANT STAPHYLOCOCCUS AUREUS  Final      Susceptibility   Methicillin resistant staphylococcus aureus - MIC*    CIPROFLOXACIN <=0.5 SENSITIVE Sensitive     ERYTHROMYCIN >=8 RESISTANT Resistant     GENTAMICIN <=0.5 SENSITIVE Sensitive     OXACILLIN >=4 RESISTANT Resistant     TETRACYCLINE <=1 SENSITIVE Sensitive     VANCOMYCIN <=0.5 SENSITIVE Sensitive     TRIMETH/SULFA <=10 SENSITIVE Sensitive     CLINDAMYCIN <=0.25 SENSITIVE Sensitive     RIFAMPIN <=0.5 SENSITIVE Sensitive      Inducible Clindamycin NEGATIVE Sensitive     * FEW METHICILLIN RESISTANT STAPHYLOCOCCUS AUREUS     Radiology Studies: No results found.   LOS: 5 days   Antonieta Pert, MD [facility]  06/23/2021, 11:43 AM

## 2021-06-23 NOTE — Progress Notes (Signed)
Manufacturing engineer [facility])    [place] liaisons will continue to follow for any discharge planning needs and to coordinate admission onto hospice care.    A Please do not hesitate to call with questions.   Thank you,   Farrel Gordon, RN, [facility] Liaison   [phone]

## 2021-06-24 ENCOUNTER — Ambulatory Visit
Admit: 2021-06-24 | Discharge: 2021-06-24 | Disposition: A | Discharge status: Home / Self Care | Payer: Medicaid Other | Attending: Radiation Oncology | Admitting: Radiation Oncology

## 2021-06-24 DIAGNOSIS — C50411 Malignant neoplasm of upper-outer quadrant of right female breast: Secondary | ICD-10-CM | POA: Diagnosis not present

## 2021-06-24 DIAGNOSIS — D649 Anemia, unspecified: Secondary | ICD-10-CM | POA: Diagnosis not present

## 2021-06-24 LAB — HEMOGLOBIN AND HEMATOCRIT, BLOOD
HCT: 34.6 % — ABNORMAL LOW (ref 36.0–46.0)
Hemoglobin: 11.4 g/dL — ABNORMAL LOW (ref 12.0–15.0)

## 2021-06-24 LAB — TYPE AND SCREEN
ABO/RH(D): O POS
Antibody Screen: NEGATIVE
Unit division: 0

## 2021-06-24 LAB — BPAM RBC
Blood Product Expiration Date: 202208222359
ISSUE DATE / TIME: 202207201705
Unit Type and Rh: 5100

## 2021-06-24 MED ORDER — METOPROLOL TARTRATE 25 MG PO TABS
12.5000 mg | ORAL_TABLET | Freq: Two times a day (BID) | ORAL | 1 refills | Status: DC
Start: 2021-06-24 — End: 2021-07-15

## 2021-06-24 MED ORDER — POLYETHYLENE GLYCOL 3350 17 G PO PACK: 17.0000 g | PACK | Freq: Every day | ORAL | 0 refills | Status: DC | PRN

## 2021-06-24 MED ORDER — METRONIDAZOLE 500 MG PO TABS: ORAL_TABLET | ORAL | 0 refills | Status: DC

## 2021-06-24 MED ORDER — GELATIN ABSORBABLE 12-7 MM EX MISC: 1.0000 | CUTANEOUS | 12 refills | Status: DC | PRN

## 2021-06-24 MED ORDER — DOXYCYCLINE HYCLATE 100 MG PO CAPS: 100.0000 mg | ORAL_CAPSULE | Freq: Two times a day (BID) | ORAL | 0 refills | Status: AC

## 2021-06-24 MED ORDER — SENNOSIDES-DOCUSATE SODIUM 8.6-50 MG PO TABS: 1.0000 | ORAL_TABLET | Freq: Two times a day (BID) | ORAL | 0 refills | Status: DC

## 2021-06-24 MED ORDER — HYDROMORPHONE HCL 1 MG/ML IJ SOLN
0.5000 mg | INTRAMUSCULAR | Status: DC | PRN
  Administered 2021-06-24 – 2021-06-25 (×4): 0.5 mg via INTRAVENOUS
  Filled 2021-06-24 (×4): qty 0.5

## 2021-06-24 NOTE — Progress Notes (Signed)
[facility])   Coordinated with hospital team for a plan for pt to receive treatments while inpatient through Friday, then to discharge home Saturday morning to be admitted onto hospice services Saturday evening.  [facility] will arrange transportation to pt's last two treatments on Monday and Tuesday.  Times and contact numbers have be copied into pt's hospice chart.    [place] liaisons will continue to follow for any discharge planning needs and to coordinate admission onto hospice care.    Thank you for the opportunity to participate in this patient's care.     Domenic Moras, BSN, RN [facility] Liaison [phone] (24h on call)

## 2021-06-24 NOTE — Progress Notes (Signed)
PROGRESS NOTE    Rebecca [name]  ZOX:[id number] DOB: 03/31/1976 DOA: 06/17/2021 PCP: Dorena Dew, FNP   Chief Complaint  Patient presents with   Nausea   Bleeding/Bruising    Bleeding from tumor from breast cancer   Brief Narrative: 45 year old female with metastatic breast cancer diagnosed 2017, with mets to bone and chest wall GERD presented to the ED with generalized weakness, chest wall pain. As per the report patient was getting her cancer treatment under the care of cancer center for [facility] in [place] and has suggested all treatment modalities and was referred for home hospice about 2 weeks ago .  She was referred for hospice with [facility] after patient contacted Dr. [name] office on 7/14 Patient complains of progressively worsening generalized weakness, also having bleeding from her wounds in the right chest wall and left axilla. Seen in the ED and found to have severe anemia and admitted. She was also found to have cellulitis abscess on the index finger of the left hand-underwent or an I&D 7/16. Repeated episode of bleeding on 7/17- tachycardic, hb donw to 6.3, give bolus iv and 2 unit prbc.CCS saw patient. Radiation oncologist was consulted for radiation of the breast to prevent bleeding.  Underwent radiation treatment 7/20.  Subjective: Seen this morning no new complaints.   Remains mildly tachycardic which is her baseline.  Blood pressure stable.  Assessment & Plan:  Symptomatic severe anemia Anemia of chronic disease/cancer  and anemia from chronic blood loss from breast cancer Total 5 units PRBC, hb has improved to 9.4 g.seen by surgery oncology radiation oncology and started palliative radiotherapy for bleeding issues underwent 1 session and more on 7/21, 7/22, 7/25, 7/26.  If radiation therapy can be arranged as outpatient with or without hospice versus home health, then she can be discharged home. Recent Labs  Lab 06/19/21 0315 06/20/21 1450  06/21/21 0339 06/23/21 0840 06/24/21 1045  HGB 8.0* 6.3* 9.0* 7.6* 11.4*  HCT 24.6* 19.7* 27.6* 23.5* 34.6*    Extensive right chest wall wound as well as left axillary wound Wound care to continue as she allows.  Reinforced patient t to allow nursing for dressing change. Malignant neoplasm of upper-outer quadrant of right breast in female, estrogen receptor negative metastatic disease: Poor prognosis appears patient has referred for home with hospice after she exhausted all chemotherapeutic options.  Radiation oncology has seen the patient and planning for palliative radiotherapy x5 sessions until Tuesday   MRSA Cellulitis/abscess of fifth finger on left hand: s/p "Left small finger flexor sheath incision and drainage tendon sheath. Left small finger and hand drainage of complicated abscess deep space abscess".  Continue continue dressing of the wound continue antibiotics    Sinus tachycardia suspect multifactorial due to metastatic cancer, anemia.Anemia has improved with transfusion.Heart rate overall stable on the higher side continue low-dose metoprolol as long as blood pressure tolerates.    Leucocytosis:in the setting of cellulitis/malignancy.  On antibiotics GERD-on ppi. Mild elevated WJX:BJYNWGN. Thrombocytopenia in the setting of metastatic disease.  Hypomagnesemia-repleted Hypokalemia repleted.  Anasarca: Peripheral edema with severe hypoalbuminemia anemia likely from poor intake, in the setting of metastatic cancer, supportive measures. Goals of care, counseling/discussion: Had a frank discussion and she agreed for no chest compression but wants rest of the aggressive care including intubation.  Palliative care following closely.  Plan is for home with hospice once getting radiation therapy  Diet Order             Diet regular Room service appropriate?  Yes; Fluid consistency: Thin  Diet effective now                   Patient's Body mass index is 28.34 kg/m.  DVT  prophylaxis: SCD's Start: 06/19/21 2029 SCDs Start: 06/17/21 2101 Code Status:   Code Status: Partial Code  Family Communication: plan of care discussed with patient and her daughter at bedside. Status is: Inpatient Remains inpatient for ongoing management of bleeding from the breast cancer and anemia  Dispo: The patient is from: Home              Anticipated d/c is to:  Home with hospice  once Op XRT arranged              Patient currently is not medically stable to d/c.   Difficult to place patient No   Medications reviewed: Scheduled Meds:  (feeding supplement) PROSource Plus  30 mL Oral BID BM   sodium chloride   Intravenous Once   sodium chloride   Intravenous Once   Chlorhexidine Gluconate Cloth  6 each Topical Daily   doxycycline  100 mg Oral Q12H   gabapentin  300 mg Oral TID   mouth rinse  15 mL Mouth Rinse BID   methadone  5 mg Oral Q8H   metoprolol tartrate  12.5 mg Oral BID   Metronidazole (Flagyl) 500mg  tablet  1,000 mg Topical Daily   nutrition supplement (JUVEN)  1 packet Oral BID BM   pantoprazole  40 mg Oral Daily   polyethylene glycol  17 g Oral Daily   senna-docusate  1 tablet Oral BID   sodium chloride flush  10-40 mL Intracatheter Q12H   Continuous Infusions:   Consultants:see note  Procedures:see note Antimicrobials: Anti-infectives (From admission, onward)    Start     Dose/Rate Route Frequency Ordered Stop   06/19/21 1100  doxycycline (VIBRA-TABS) tablet 100 mg        100 mg Oral Every 12 hours 06/19/21 1013     06/18/21 1400  metroNIDAZOLE (FLAGYL) tablet 500 mg  Status:  Discontinued        500 mg Oral Daily 06/18/21 1310 06/18/21 1323   06/17/21 2200  cefTRIAXone (ROCEPHIN) 1 g in sodium chloride 0.9 % 100 mL IVPB  Status:  Discontinued        1 g 200 mL/hr over 30 Minutes Intravenous Every 24 hours 06/17/21 2121 06/22/21 1011   06/17/21 1745  doxycycline (VIBRAMYCIN) 100 mg in sodium chloride 0.9 % 250 mL IVPB        100 mg 125 mL/hr over  120 Minutes Intravenous  Once 06/17/21 1739 06/17/21 2052   06/17/21 0000  doxycycline (VIBRAMYCIN) 100 MG capsule        100 mg Oral 2 times daily 06/17/21 1747        Culture/Microbiology    Component Value Date/Time   SDES  06/19/2021 1853    ABSCESS Performed at [facility], [place] [address] [place], [place] [address]    SPECREQUEST  06/19/2021 1853    LEFT HAND Performed at [facility], [place] [address] [place], [place] [address]    CULT  06/19/2021 1853    FEW METHICILLIN RESISTANT STAPHYLOCOCCUS AUREUS NO ANAEROBES ISOLATED; CULTURE IN PROGRESS FOR 5 DAYS    REPTSTATUS PENDING 06/19/2021 1853    Other culture-see note  Objective: Vitals: Today's Vitals   06/24/21 1040 06/24/21 1100 06/24/21 1115 06/24/21 1253  BP:    118/71  Pulse:    (!) 105  Resp:    20  Temp:    97.6 F (36.4 C)  TempSrc:    Oral  SpO2:    100%  Weight:      Height:      PainSc: 10-Worst pain ever 10-Worst pain ever 9      Intake/Output Summary (Last 24 hours) at 06/24/2021 1316 Last data filed at 06/24/2021 6203 Gross per 24 hour  Intake 1128 ml  Output --  Net 1128 ml    Filed Weights   06/17/21 1724  Weight: 68 kg   Weight change:   Intake/Output from previous day: 07/20 0701 - 07/21 0700 In: 5597 [P.O.:920; I.V.:10; Blood:398] Out: -  Intake/Output this shift: No intake/output data recorded. Filed Weights   06/17/21 1724  Weight: 68 kg   Examination: General exam: AAOx 3, older than stated age, weak appearing. HEENT:Oral mucosa moist, Ear/Nose WNL grossly, dentition normal. Respiratory system: bilaterally diminished,  no use of accessory muscle, fungating mass with extensive deficient right anterior chest wall Cardiovascular system: S1 & S2 +, No JVD,. Gastrointestinal system: Abdomen soft,NT,ND, BS+ Nervous System:Alert, awake, moving extremities and grossly nonfocal Extremities: no edema, distal peripheral pulses palpable.   Skin: No rashes,no icterus. MSK: Normal muscle bulk,tone, power    Data Reviewed: I have personally reviewed following labs and imaging studies CBC: Recent Labs  Lab 06/17/21 1850 06/18/21 0809 06/18/21 1523 06/19/21 0315 06/20/21 1450 06/21/21 0339 06/23/21 0840 06/24/21 1045  WBC 21.2* 15.3* 20.3* 16.7* 14.1*  --   --   --   NEUTROABS 18.9* 12.7*  --   --   --   --   --   --   HGB 5.2* 6.4* 8.5* 8.0* 6.3* 9.0* 7.6* 11.4*  HCT 16.3* 18.6* 25.6* 24.6* 19.7* 27.6* 23.5* 34.6*  MCV 85.3 85.3 84.8 85.4 87.9  --   --   --   PLT 158 130* 150 134* 135*  --   --   --     Basic Metabolic Panel: Recent Labs  Lab 06/17/21 1850 06/18/21 0809 06/20/21 1450  NA 137 136 138  K 4.4 4.5 3.2*  CL 97* 99 105  CO2 24 28 25   GLUCOSE 101* 85 105*  BUN 15 16 10   CREATININE 1.17* 1.05* 0.79  CALCIUM 7.9* 7.3* 7.4*  MG  --  1.1*  --     GFR: Estimated Creatinine Clearance: 76.7 mL/min (by C-G formula based on SCr of 0.79 mg/dL). Liver Function Tests: Recent Labs  Lab 06/17/21 1850 06/18/21 0809  AST 52* 75*  ALT 20 20  ALKPHOS 192* 166*  BILITOT 0.5 0.6  PROT 5.8* 5.2*  ALBUMIN 2.2* 2.0*    No results for input(s): LIPASE, AMYLASE in the last 168 hours. No results for input(s): AMMONIA in the last 168 hours. Coagulation Profile: Recent Labs  Lab 06/18/21 0809 06/20/21 1450  INR 1.5* 1.6*    Cardiac Enzymes: No results for input(s): CKTOTAL, CKMB, CKMBINDEX, TROPONINI in the last 168 hours. BNP (last 3 results) No results for input(s): PROBNP in the last 8760 hours. HbA1C: No results for input(s): HGBA1C in the last 72 hours. CBG: No results for input(s): GLUCAP in the last 168 hours. Lipid Profile: No results for input(s): CHOL, HDL, LDLCALC, TRIG, CHOLHDL, LDLDIRECT in the last 72 hours. Thyroid Function Tests: No results for input(s): TSH, T4TOTAL, FREET4, T3FREE, THYROIDAB in the last 72 hours. Anemia Panel: No results for input(s): VITAMINB12, FOLATE,  FERRITIN, TIBC,  IRON, RETICCTPCT in the last 72 hours. Sepsis Labs: No results for input(s): PROCALCITON, LATICACIDVEN in the last 168 hours.  Recent Results (from the past 240 hour(s))  Resp Panel by RT-PCR (Flu A&B, Covid) Nasopharyngeal Swab     Status: None   Collection Time: 06/17/21  9:47 PM   Specimen: Nasopharyngeal Swab; Nasopharyngeal(NP) swabs in vial transport medium  Result Value Ref Range Status   SARS Coronavirus 2 by RT PCR NEGATIVE NEGATIVE Final    Comment: (NOTE) SARS-CoV-2 target nucleic acids are NOT DETECTED.  The SARS-CoV-2 RNA is generally detectable in upper respiratory specimens during the acute phase of infection. The lowest concentration of SARS-CoV-2 viral copies this assay can detect is 138 copies/mL. A negative result does not preclude SARS-Cov-2 infection and should not be used as the sole basis for treatment or other patient management decisions. A negative result may occur with  improper specimen collection/handling, submission of specimen other than nasopharyngeal swab, presence of viral mutation(s) within the areas targeted by this assay, and inadequate number of viral copies(<138 copies/mL). A negative result must be combined with clinical observations, patient history, and epidemiological information. The expected result is Negative.  Fact Sheet for Patients:  EntrepreneurPulse.com.au  Fact Sheet for Healthcare Providers:  IncredibleEmployment.be  This test is no t yet approved or cleared by the [place] FDA and  has been authorized for detection and/or diagnosis of SARS-CoV-2 by FDA under an Emergency Use Authorization (EUA). This EUA will remain  in effect (meaning this test can be used) for the duration of the COVID-19 declaration under Section 564(b)(1) of the Act, 21 U.S.C.section 360bbb-3(b)(1), unless the authorization is terminated  or revoked sooner.       Influenza A by PCR NEGATIVE  NEGATIVE Final   Influenza B by PCR NEGATIVE NEGATIVE Final    Comment: (NOTE) The Xpert Xpress SARS-CoV-2/FLU/RSV plus assay is intended as an aid in the diagnosis of influenza from Nasopharyngeal swab specimens and should not be used as a sole basis for treatment. Nasal washings and aspirates are unacceptable for Xpert Xpress SARS-CoV-2/FLU/RSV testing.  Fact Sheet for Patients: EntrepreneurPulse.com.au  Fact Sheet for Healthcare Providers: IncredibleEmployment.be  This test is not yet approved or cleared by the [place] FDA and has been authorized for detection and/or diagnosis of SARS-CoV-2 by FDA under an Emergency Use Authorization (EUA). This EUA will remain in effect (meaning this test can be used) for the duration of the COVID-19 declaration under Section 564(b)(1) of the Act, 21 U.S.C. section 360bbb-3(b)(1), unless the authorization is terminated or revoked.  Performed at [facility], [place] [address], [place], [place] [address]   Culture, blood (routine x 2)     Status: None   Collection Time: 06/18/21 12:22 AM   Specimen: BLOOD  Result Value Ref Range Status   Specimen Description   Final    BLOOD LEFT ANTECUBITAL Performed at [place] [address], [place], [place] [address]    Special Requests   Final    BOTTLES DRAWN AEROBIC ONLY Blood Culture adequate volume Performed at [facility] [address] [place], [place] [address]    Culture   Final    NO GROWTH 5 DAYS Performed at [facility], [place] [address], [place], [place] [address]    Report Status 06/23/2021 FINAL  Final  Culture, blood (routine x 2)     Status: None   Collection Time: 06/18/21 12:22 AM   Specimen: BLOOD  Result Value Ref Range Status   Specimen Description   Final    BLOOD BLOOD LEFT HAND Performed at [place] [address] [place], [place]  [address]    Special Requests   Final    BOTTLES DRAWN AEROBIC ONLY Blood Culture adequate volume Performed at [facility] [address], [place], [place] [address]    Culture   Final    NO GROWTH 5 DAYS Performed at [facility], [place] [address] [place], [place] [address]    Report Status 06/23/2021 FINAL  Final  Surgical pcr screen     Status: None   Collection Time: 06/19/21  6:08 PM   Specimen: Nasal Mucosa; Nasal Swab  Result Value Ref Range Status   MRSA, PCR NEGATIVE NEGATIVE Final   Staphylococcus aureus NEGATIVE NEGATIVE Final    Comment: (NOTE) The Xpert SA Assay (FDA approved for NASAL specimens in patients 54 years of age and older), is one component of a comprehensive surveillance program. It is not intended to diagnose infection nor to guide or monitor treatment. Performed at [facility], [place] [address], [place], [place] [address]   Aerobic/Anaerobic Culture w Gram Stain (surgical/deep wound)     Status: None (Preliminary result)   Collection Time: 06/19/21  6:53 PM   Specimen: PATH Other; Tissue  Result Value Ref Range Status   Specimen Description   Final    ABSCESS Performed at [place] [address] [place], [place] [address]    Special Requests   Final    LEFT HAND Performed at [facility], [place] [address] [place] [address], [place], [place] [address]    Gram Stain   Final    MODERATE WBC PRESENT, PREDOMINANTLY PMN MODERATE GRAM POSITIVE COCCI IN CLUSTERS Performed at [facility], [place] [address] [place], [place] [address]    Culture   Final    FEW METHICILLIN RESISTANT STAPHYLOCOCCUS AUREUS NO ANAEROBES ISOLATED; CULTURE IN PROGRESS FOR 5 DAYS    Report Status PENDING  Incomplete   Organism ID, Bacteria METHICILLIN RESISTANT STAPHYLOCOCCUS AUREUS  Final      Susceptibility   Methicillin resistant staphylococcus aureus - MIC*    CIPROFLOXACIN <=0.5 SENSITIVE Sensitive      ERYTHROMYCIN >=8 RESISTANT Resistant     GENTAMICIN <=0.5 SENSITIVE Sensitive     OXACILLIN >=4 RESISTANT Resistant     TETRACYCLINE <=1 SENSITIVE Sensitive     VANCOMYCIN <=0.5 SENSITIVE Sensitive     TRIMETH/SULFA <=10 SENSITIVE Sensitive     CLINDAMYCIN <=0.25 SENSITIVE Sensitive     RIFAMPIN <=0.5 SENSITIVE Sensitive     Inducible Clindamycin NEGATIVE Sensitive     * FEW METHICILLIN RESISTANT STAPHYLOCOCCUS AUREUS     Radiology Studies: No results found.   LOS: 6 days   Antonieta Pert, MD [facility]  06/24/2021, 1:16 PM

## 2021-06-24 NOTE — TOC Initial Note (Addendum)
Transition of Care [facility]) - Initial/Assessment Note    Patient Details  Name: Rebecca [name] MRN: [id number] Date of Birth: 1976-06-03  Transition [facility]) CM/SW Contact:    Ross Ludwig, LCSW Phone Number: 06/24/2021, 3:58 PM  Clinical Narrative:                 CSW spoke to [place] at [facility] to discuss discharge planning for patient.  Per [facility] they can accept patient on Saturday; and have start of care set up sat Saturday night at 6pm.  Patient will continue with palliative radiation therapy.  Expected Discharge Plan: Home w Hospice Care Barriers to Discharge: Continued Medical Work up   Patient Goals and CMS Choice Patient states their goals for this hospitalization and ongoing recovery are:: To return home with home hospice services. CMS Medicare.gov Compare Post Acute Care list provided to:: Patient Choice offered to / list presented to : Patient  Expected Discharge Plan and Services Expected Discharge Plan: [place] arrangements for the past 2 months: [place] Agency: Hospice and [place] Date [place]: 06/18/21 Time HH Agency Contacted: 1200 Representative spoke with at [place]: [place] Arrangements/Services Living arrangements for the past 2 months: [place] Lives with:: Relatives, Parents Patient language and need for interpreter reviewed:: Yes Do you feel safe going back to the place where you live?: Yes      Need for Family Participation in Patient Care: No (Comment) Care giver support system in place?: No (comment)   Criminal Activity/Legal Involvement Pertinent to Current Situation/Hospitalization: No - Comment as needed  Activities of Daily Living Home Assistive Devices/Equipment: Walker (specify type) ADL Screening (condition at time of admission) Patient's cognitive ability adequate to safely  complete daily activities?: Yes Is the patient deaf or have difficulty hearing?: No Does the patient have difficulty seeing, even when wearing glasses/contacts?: No Does the patient have difficulty concentrating, remembering, or making decisions?: No Patient able to express need for assistance with ADLs?: Yes Does the patient have difficulty dressing or bathing?: Yes Independently performs ADLs?: No Communication: Independent Dressing (OT): Needs assistance Is this a change from baseline?: Pre-admission baseline Grooming: Independent Feeding: Independent Bathing: Needs assistance Is this a change from baseline?: Pre-admission baseline Toileting: Needs assistance Is this a change from baseline?: Pre-admission baseline In/Out Bed: Needs assistance Is this a change from baseline?: Pre-admission baseline Walks in Home: Needs assistance Is this a change from baseline?: Pre-admission baseline Does the patient have difficulty walking or climbing stairs?: Yes Weakness of Legs: Both Weakness of Arms/Hands: Both  Permission Sought/Granted Permission sought to share information with : Facility Sport and exercise psychologist, Family Supports Permission granted to share information with : Yes, Release of Information Signed  Share Information with NAME: [name] Mother [phone]  [phone]  Permission granted to share info w AGENCY: [place] services.        Emotional Assessment Appearance:: Appears stated age   Affect (typically observed): Accepting, Afraid/Fearful, Angry, Appropriate Orientation: : Oriented to Self, Oriented to Place, Oriented to  Time, Oriented to Situation      Admission diagnosis:  Pneumonia [J18.9] Bone metastases (HCC) [C79.51] Cellulitis of finger of left hand [L03.012] Symptomatic anemia [D64.9] Anemia, unspecified type [D64.9] Malignant neoplasm of  upper-outer quadrant of right breast in female, estrogen receptor negative ([place]) [C50.411,  Z17.1] Severe anemia [D64.9] Patient Active Problem List   Diagnosis Date Noted   Pressure injury of skin 06/22/2021   Severe anemia 06/18/2021   Symptomatic anemia 06/17/2021   GERD without esophagitis 06/17/2021   Cellulitis of finger of left hand 06/17/2021   Anasarca 06/17/2021   Neutropenic fever ([place]) 08/14/2020   Sepsis ([place]) 08/14/2020   Acute lower UTI 08/14/2020   Open chest wound, right, initial encounter 08/14/2020   Anemia associated with chemotherapy 08/14/2020   Neuropathy due to chemotherapeutic drug ([place]) 07/10/2019   Recurrent breast adenocarcinoma ([place]) 07/02/2019   Bone metastases ([place]) 07/02/2019   Goals of care, counseling/discussion 07/02/2019   Morbid obesity with body mass index (BMI) of 40.0 to 44.9 in adult [facility]) 04/10/2018   Genetic testing 11/20/2016   Family history of breast cancer 11/20/2016   Malignant neoplasm of upper-outer quadrant of right breast in female, estrogen receptor negative ([place]) 02/04/2016   Hirsutism 01/13/2016   Tobacco dependence 01/13/2016   Irregular periods/menstrual cycles 01/13/2016   PCP:  Dorena Dew, FNP Pharmacy:   [id number] - [place], [place] [address] Phone: [phone] Fax: [phone]     Social Determinants of Health (SDOH) Interventions    Readmission Risk Interventions No flowsheet data found.

## 2021-06-25 ENCOUNTER — Ambulatory Visit
Admit: 2021-06-25 | Discharge: 2021-06-25 | Disposition: A | Discharge status: Home / Self Care | Payer: Medicaid Other | Attending: Radiation Oncology | Admitting: Radiation Oncology

## 2021-06-25 DIAGNOSIS — D649 Anemia, unspecified: Secondary | ICD-10-CM | POA: Diagnosis not present

## 2021-06-25 DIAGNOSIS — C50411 Malignant neoplasm of upper-outer quadrant of right female breast: Secondary | ICD-10-CM | POA: Diagnosis not present

## 2021-06-25 LAB — AEROBIC/ANAEROBIC CULTURE W GRAM STAIN (SURGICAL/DEEP WOUND)

## 2021-06-25 MED ORDER — ADULT MULTIVITAMIN W/MINERALS CH
1.0000 | ORAL_TABLET | Freq: Every day | ORAL | Status: DC
  Administered 2021-06-25 – 2021-06-26 (×2): 1 via ORAL
  Filled 2021-06-25 (×2): qty 1

## 2021-06-25 NOTE — Progress Notes (Signed)
[place] 1429- [facility])   Made aware of need for DME at time of discharge: bedside commode and supplemental O2.  This has been ordered for STAT delivery today.  [place] liaisons will follow up tomorrow morning to ensure DME is in place before discharge.   Thank you for the opportunity to participate in this patient's care.     Domenic Moras, BSN, RN [facility] Liaison [phone] (24h on call)

## 2021-06-25 NOTE — Progress Notes (Signed)
Dressing change due this am and asked pt would she like her dressing changed or did she do it already.  Pt states that she changed her dressing at about 2200 last night.  Pt does not allow for nursing staff to perform dressing changes. Pt at this time still does not desire to wear telemetry.

## 2021-06-25 NOTE — Progress Notes (Signed)
   06/24/21 2040  Cardiac  Cardiac (WDL) X  ECG Monitor Yes (pt is refuses at this time. tele aware.)  Pt does not want to wear telemetry at pt states it is painful and she does not feel like it at the moment. Pt will make me aware if and when she decides to wear telemetry.

## 2021-06-25 NOTE — Progress Notes (Signed)
Telemetry informed.  Pt does not desire to wear tele at this time.

## 2021-06-25 NOTE — Progress Notes (Signed)
Nutrition Follow-up  DOCUMENTATION CODES:   Not applicable  INTERVENTION:  - continue 1 packet Juven BID and 30 ml Prosource Plus BID.  - weigh patient today.    NUTRITION DIAGNOSIS:   Increased nutrient needs related to cancer and cancer related treatments as evidenced by estimated needs. -ongoing  GOAL:   Patient will meet greater than or equal to 90% of their needs -minimally met to unmet on average  MONITOR:   PO intake, Supplement acceptance, Weight trends, Labs, I & O's  ASSESSMENT:   Patient with PMH significant for metastatic breast cancer to bone lymph nodes chest wall, GERD, and HTN. Presents this admission with symptomatic anemia 2/2 to ongoing intermittent bleeding of R chest and L axilla.  She is currently out of the room to XRT.  Recently documented meals: 100% of breakfast and 0% of lunch on 7/17; 50% of lunch on 7/18; 25% of dinner on 7/20; 25% of dinner on 7/21; 25% of breakfast today.  Patient has been accepting Juven and Prosource ~50% of the time offered. She does not like Boost Plus, Boost Breeze, or Ensure products.   She has not been weighed since admission on 7/14. Mild pitting edema to all extremities documented in the edema section of flow sheet.   Per notes: - symptomatic severe anemia on admission s/p 5 units PRBCs - extensive R chest wall wound and L axillary wound - malignant neoplasm to upper-outer quadrant of R breast  - MRSA cellulitis/abscess of 5th finger on L hand - anasarca with peripheral edema--thought to be 2/2 poor PO intake - s/p GOC dicussion--limited code - plan at time of d/c is for home with hospice     Labs reviewed; K: 3.2 mmol/l, Ca: 7.4 mmol/l. Medications reviewed; 40 mg oral protonix/day, 17 g miralax/day, 1 tablet senokot BID.   Diet Order:   Diet Order             Diet general           Diet regular Room service appropriate? Yes; Fluid consistency: Thin  Diet effective now                   EDUCATION  NEEDS:   Not appropriate for education at this time  Skin:  Skin Assessment: Skin Integrity Issues: Skin Integrity Issues:: Stage II, Incisions Stage II: sacrum (newly documneted 7/18) Incisions: L arm + L hand (7/16)  Last BM:  7/21  Height:   Ht Readings from Last 1 Encounters:  06/17/21 5' 1"  (1.549 m)    Weight:   Wt Readings from Last 1 Encounters:  06/17/21 68 kg     Estimated Nutritional Needs:  Kcal:  1800-2000 kcal Protein:  90-105 grams Fluid:  >/= 1.8 L/day     Jarome Matin, MS, RD, LDN, CNSC Inpatient Clinical Dietitian RD pager # available in [facility]  After hours/weekend pager # available in [facility]

## 2021-06-25 NOTE — Progress Notes (Signed)
Pt still does not want to wear telemetry at this time.  Pt states she is in pain. Pt has been medicated per md order.

## 2021-06-25 NOTE — Progress Notes (Signed)
    OVERNIGHT PROGRESS REPORT  Notified by nurse for patient refusing to wear cardiac monitor.  She refuses to wear it for any length of time  She is a Partial CODE  She is consulted for Palliative and her Discharge planning is : Home with Hospice per Palliative note of 06/22/21 @ 1233 Hrs She is A&O x 4 and is aware of the risk of not allowing this monitoring.  She has expressed refusal to wear this monitoring multiple times. It is currently discontinued.   Gershon Cull MSNA MSN ACNPC-AG Acute Care Nurse Practitioner [place]

## 2021-06-26 DIAGNOSIS — D649 Anemia, unspecified: Secondary | ICD-10-CM | POA: Diagnosis not present

## 2021-06-26 MED ORDER — HEPARIN SOD (PORK) LOCK FLUSH 100 UNIT/ML IV SOLN
500.0000 [IU] | INTRAVENOUS | Status: AC | PRN
  Administered 2021-06-26: 500 [IU]
  Filled 2021-06-26: qty 5

## 2021-06-26 NOTE — Progress Notes (Signed)
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 Called pt's mother Rebecca [name] at [phone] but unable to leave a voicemail due to mailbox being full.  Called [employer] who report DME on a truck for STAT delivery.  For contact numbers [employer] has Pt's own cell number as well as her mother's number. TOC Eric made aware.  [place] liaisons will follow up to ensure DME is in place before discharge.   Thank you for the opportunity to participate in this patient's care.     Domenic Moras, BSN, RN [facility] Liaison [phone] (24h on call)

## 2021-06-26 NOTE — Discharge Summary (Addendum)
Physician Discharge Summary  Patient ID: Rebecca [name] MRN: [id number] DOB/AGE: 47-Apr-1977 47 y.o.  Admit date: 06/17/2021 Discharge date: 06/26/2021  Admission Diagnoses: Metastatic Breast Cancer c/b R chest wall and L axillary wounds Symptomatic Anemia Cellulitis Anasarca  Discharge Diagnoses:  Principal Problem:   Symptomatic anemia Active Problems:   Malignant neoplasm of upper-outer quadrant of right breast in female, estrogen receptor negative ([place])   Goals of care, counseling/discussion   GERD without esophagitis   Cellulitis of finger of left hand   Anasarca   Severe anemia   Pressure injury of skin   Discharged Condition: poor  Hospital Course: The patient has a longstanding  history of metastatic right breast cancer metastatic to bone, lymph nodes and chest wall.  Patient is followed with Dr. Marjie [name] in [facility] cancer center.  Patient's clinical course has been complicated by an extensive right chest wall wound and now more recently a left axillary wound.  She has received multiple courses of various chemotherapy regimens and has most recently been under the care of of [facility] in [place].  Patient and her mother report that she was recently discharged from the [facility] approximately 2 weeks ago and received her last course of chemotherapy there approximately 3 weeks prior to admission.  Patient has been informed by [facility] that she has exhausted all chemotherapeutic options and that they recommend home-going hospice.    She presented with weakness and worsening bleeding from her wounds with progressive pain. She was found to have initial Hgb of 5.2 and transfused aggressively with rise in her Hgb, which was 11.4 on discharge. She was also treated for index finger cellulitis with IV->PO doxycycline and will continue this until 7/28.  Radiation Oncology will provide palliative radiation per Dr. Gari [name].  Other pertinent problems include leukocytosis, tachycardia and thrombocytopenia. This was thought to be related to the underlying malignancy and anemia. She has peripheral edema 2/2 hypoalbuminemia.  Ongoing conversations will be to discuss her Partial code status. She is currently do not perform chest compressions, but is still wanting other aggressive care.  Consults: orthopedic surgery and radiation oncology, General surgery, Palliative Care  Significant Diagnostic Studies: microbiology: wound culture: positive for MRSA  Treatments: antibiotics: doxyccyline  Discharge Exam: Blood pressure (!) 104/59, pulse 96, temperature 98.2 F (36.8 C), temperature source Oral, resp. rate 13, height '5\' 1"'$  (1.549 m), weight 69.5 kg, SpO2 100 %. Head: Normocephalic, without obvious abnormality, atraumatic, alopecia Eyes: negative findings: lids and lashes normal, conjunctivae and sclerae normal, and corneas clear Neck: no JVD, supple, symmetrical, trachea midline, and thyroid not enlarged, symmetric, no tenderness/mass/nodules Resp: equal chest rise, no respiratory distress Chest wall: no tenderness, significant disfiguration 2/2 wound Breasts: positive findings: severe wound  Disposition: Discharge disposition: 51-Hospice/Medical Facility      Discharge Instructions     Call MD for:  severe uncontrolled pain   Complete by: As directed    Call MD for:  temperature >100.4   Complete by: As directed    Diet general   Complete by: As directed    Diet general   Complete by: As directed    Discharge patient   Complete by: As directed    Discharge disposition: 51-Hospice/Medical Facility   Discharge patient date: 06/26/2021   Discharge wound care:   Complete by: As directed    Dressing on left hand: Cleanse with NS, pat dry. Cover with adaptic oil emulsion dressing, top with dry  dressing and secure with a few turns of conform or Kerlix roll gauze/paper tape.  Dressing on chest:  Beside nurse to crush 2 Flagyl tablets(sprinkle over opened Vaseline gauze large enough to cover breast tumor).  Allow patient to then lift and place over open wound, top with 2-3 ABD pads, secure with tape. ABD pads to axilla tumor.  Allow patient and CG to perform wound care. Teach patient how to crush meds and apply to the Vaseline gauze for home use   Discharge wound care:   Complete by: As directed    Wound care to left hand operative site:  Cleanse with NS, pat dry. Cover with adaptic oil emulsion dressing Rebecca [name] # 145), top with dry dressing and secure with a few turns of conform or Kerlix roll gauze/paper tape.  Wound care for breast tumor: Crush 2 Flagyl tablets(sprinkle over opened Vaseline gauze large enough to cover breast tumor).  Allow patient to then lift and place over open wound, top with 2-3 ABD pads, secure with tape. ABD pads to axilla tumor.  Allow patient and CG to perform wound care. Teach patient how to crush meds and apply to the Vaseline gauze for home use.   Increase activity slowly   Complete by: As directed    Increase activity slowly   Complete by: As directed       Allergies as of 06/26/2021   No Known Allergies      Medication List     TAKE these medications    doxycycline 100 MG capsule Commonly known as: VIBRAMYCIN Take 1 capsule (100 mg total) by mouth 2 (two) times daily for 7 days.   gabapentin 300 MG capsule Commonly known as: NEURONTIN Take 300 mg by mouth 3 (three) times daily.   gelatin adsorbable 12-7 MM sponge Commonly known as: GELFOAM/SURGIFOAM Apply 1 each topically as needed (apply to bleeding area).   LORazepam 0.5 MG tablet Commonly known as: ATIVAN Take 1 tablet (0.5 mg total) by mouth daily as needed for anxiety.   methadone 5 MG tablet Commonly known as: DOLOPHINE Take 1 tablet (5 mg total) by mouth every 8 (eight) hours.   metoprolol tartrate 25 MG tablet Commonly known as: LOPRESSOR Take 0.5 tablets (12.5 mg total) by  mouth 2 (two) times daily.   metroNIDAZOLE 500 MG tablet Commonly known as: Flagyl Apply 1000 mg crushed on chest wall topical   naproxen sodium 220 MG tablet Commonly known as: ALEVE Take 220 mg by mouth daily as needed (pain).   ondansetron 4 MG disintegrating tablet Commonly known as: Zofran ODT Take 1 tablet (4 mg total) by mouth every 8 (eight) hours as needed for nausea or vomiting.   oxyCODONE 5 MG immediate release tablet Commonly known as: Oxy IR/ROXICODONE Take 1 tablet (5 mg total) by mouth 2 (two) times daily as needed.   polyethylene glycol 17 g packet Commonly known as: MIRALAX / GLYCOLAX Take 17 g by mouth daily as needed for up to 14 doses for moderate constipation.   senna-docusate 8.6-50 MG tablet Commonly known as: Senokot-S Take 1 tablet by mouth 2 (two) times daily.               Durable Medical Equipment  (From admission, onward)           Start     Ordered   06/26/21 1206  DME Oxygen  Once       Question Answer Comment  Length of Need 6 Months   Mode or (  Route) Nasal cannula   Liters per Minute 2   Frequency Continuous (stationary and portable oxygen unit needed)   Oxygen delivery system Gas      06/26/21 1206              Discharge Care Instructions  (From admission, onward)           Start     Ordered   06/26/21 0000  Discharge wound care:       Comments: Wound care to left hand operative site:  Cleanse with NS, pat dry. Cover with adaptic oil emulsion dressing Rebecca [name] # 145), top with dry dressing and secure with a few turns of conform or Kerlix roll gauze/paper tape.  Wound care for breast tumor: Crush 2 Flagyl tablets(sprinkle over opened Vaseline gauze large enough to cover breast tumor).  Allow patient to then lift and place over open wound, top with 2-3 ABD pads, secure with tape. ABD pads to axilla tumor.  Allow patient and CG to perform wound care. Teach patient how to crush meds and apply to the Vaseline gauze for  home use.   06/26/21 1206   06/24/21 0000  Discharge wound care:       Comments: Dressing on left hand: Cleanse with NS, pat dry. Cover with adaptic oil emulsion dressing, top with dry dressing and secure with a few turns of conform or Kerlix roll gauze/paper tape.  Dressing on chest: Beside nurse to crush 2 Flagyl tablets(sprinkle over opened Vaseline gauze large enough to cover breast tumor).  Allow patient to then lift and place over open wound, top with 2-3 ABD pads, secure with tape. ABD pads to axilla tumor.  Allow patient and CG to perform wound care. Teach patient how to crush meds and apply to the Vaseline gauze for home use   06/24/21 1356            Follow-up Information     Dorena Dew, FNP Follow up.   Specialty: Family Medicine Contact information: [place] [place] [address] [place] Follow up.   Specialty: Hospice and Palliative Medicine Contact information: [place] [phone]                Signed: Cecille Rubin, MD MPH 06/26/2021, 12:14 PM  I spent >30 min in preparation for this discharge.

## 2021-06-26 NOTE — TOC Transition Note (Addendum)
Transition of Care [facility]) - CM/SW Discharge Note   Patient Details  Name: Rebecca [name] MRN: [id number] Date of Birth: [birth year]  [facility] of [facility]) CM/SW Contact:  Ross Ludwig, LCSW Phone Number: 06/26/2021, 3:20 PM   Clinical Narrative:    Patient will be going home with home hospice through [place].        CSW signing off please reconsult with any other social work needs, home health agency has been notified of planned discharge.  [facility] has a start of care at 6:30pm scheduled.   Final next level of care: Home w Hospice Care Barriers to Discharge: Barriers Resolved   Patient Goals and CMS Choice Patient states their goals for this hospitalization and ongoing recovery are:: To return back home with hospice services. CMS Medicare.gov Compare Post Acute Care list provided to:: Patient Choice offered to / list presented to : Patient  Discharge Placement  Patient discharging back home with hospice services.                Name of family member notified: Patient notified mother. Patient and family notified of of transfer: 06/26/21  Discharge Plan and Services                DME Arranged: Oxygen, Bedside commode DME Agency: Other - Comment ([facility] hospice services.) Date DME Agency Contacted: 06/26/21 Time DME Agency Contacted: [phone]     [place] Date [place]: 06/26/21 Time [name]: [place] Representative spoke with at [place]: [place] ([place]) Interventions     Readmission Risk Interventions No flowsheet data found.

## 2021-06-26 NOTE — Progress Notes (Signed)
AVS given to patient and explained at the bedside. Medications and follow up appointments have been explained with pt verbalizing understanding.  

## 2021-06-26 NOTE — Discharge Instructions (Signed)
Continue taking your doxycycline until 7/28.

## 2021-06-26 NOTE — TOC Progression Note (Addendum)
Transition of Care [facility]) - Progression Note    Patient Details  Name: Rebecca [name] MRN: [id number] Date of Birth: 15-Nov-1976  Transition of Care [facility]) CM/SW Contact  Ross Ludwig, [place] Phone Number: 06/26/2021, 9:30 AM  Clinical Narrative:     CSW spoke to [place] from [place] to find out if equipment has been delivered.  She is checking with DME company to find out and will call CSW back.  10:30pm  CSW spoke to patient and informed her that [place], was trying to get a hold of someone to deliver equipment, but patient's mom did not answer the phone.  Per patient she will call her mom, to make sure someone is home for delivery of the oxygen and 3 in 1.  Per [facility] they do not want patient to discharge until oxygen has been delivered in case she needs it upon arrival.  1:50pm  Per bedside nurse, patient will be transported by family home.  CSW did prepare packet for EMS if patient changes her mind.    Expected Discharge Plan: Home w Hospice Care Barriers to Discharge: Continued Medical Work up  Expected Discharge Plan and Services Expected Discharge Plan: [place] arrangements for the past 2 months: [place]: Hospice and [place] Date [facility] Agency Contacted: 06/18/21 Time HH Agency Contacted: 1200 Representative spoke with at [place]: [place] Interventions    Readmission Risk Interventions No flowsheet data found.

## 2021-06-28 ENCOUNTER — Other Ambulatory Visit: Payer: Self-pay

## 2021-06-28 ENCOUNTER — Ambulatory Visit
Admission: RE | Admit: 2021-06-28 | Discharge: 2021-06-28 | Disposition: A | Discharge status: Home / Self Care | Payer: Medicaid Other | Source: Ambulatory Visit | Attending: Radiation Oncology | Admitting: Radiation Oncology

## 2021-06-28 DIAGNOSIS — Z171 Estrogen receptor negative status [ER-]: Secondary | ICD-10-CM | POA: Diagnosis not present

## 2021-06-28 DIAGNOSIS — Z510 Encounter for antineoplastic radiation therapy: Secondary | ICD-10-CM | POA: Diagnosis not present

## 2021-06-28 DIAGNOSIS — C50411 Malignant neoplasm of upper-outer quadrant of right female breast: Secondary | ICD-10-CM | POA: Diagnosis not present

## 2021-06-29 ENCOUNTER — Encounter: Payer: Self-pay | Admitting: Radiation Oncology

## 2021-06-29 ENCOUNTER — Ambulatory Visit
Admission: RE | Admit: 2021-06-29 | Discharge: 2021-06-29 | Disposition: A | Discharge status: Home / Self Care | Payer: Medicaid Other | Source: Ambulatory Visit | Attending: Radiation Oncology | Admitting: Radiation Oncology

## 2021-06-29 ENCOUNTER — Ambulatory Visit: Payer: Medicaid Other

## 2021-06-29 DIAGNOSIS — C50411 Malignant neoplasm of upper-outer quadrant of right female breast: Secondary | ICD-10-CM | POA: Diagnosis not present

## 2021-06-29 DIAGNOSIS — Z510 Encounter for antineoplastic radiation therapy: Secondary | ICD-10-CM | POA: Diagnosis not present

## 2021-06-30 ENCOUNTER — Ambulatory Visit: Payer: Medicaid Other

## 2021-07-01 ENCOUNTER — Ambulatory Visit: Payer: Medicaid Other

## 2021-07-02 ENCOUNTER — Ambulatory Visit: Payer: Medicaid Other

## 2021-07-04 ENCOUNTER — Emergency Department (HOSPITAL_COMMUNITY)
Admission: EM | Admit: 2021-07-04 | Discharge: 2021-07-05 | Disposition: A | Discharge status: Home / Self Care | Payer: Medicaid Other | Attending: Emergency Medicine | Admitting: Emergency Medicine

## 2021-07-04 ENCOUNTER — Encounter (HOSPITAL_COMMUNITY): Payer: Self-pay

## 2021-07-04 ENCOUNTER — Other Ambulatory Visit: Payer: Self-pay

## 2021-07-04 DIAGNOSIS — H9201 Otalgia, right ear: Secondary | ICD-10-CM | POA: Diagnosis present

## 2021-07-04 DIAGNOSIS — I10 Essential (primary) hypertension: Secondary | ICD-10-CM | POA: Diagnosis not present

## 2021-07-04 DIAGNOSIS — Z87891 Personal history of nicotine dependence: Secondary | ICD-10-CM | POA: Diagnosis not present

## 2021-07-04 DIAGNOSIS — H6691 Otitis media, unspecified, right ear: Secondary | ICD-10-CM | POA: Insufficient documentation

## 2021-07-04 DIAGNOSIS — R000 Tachycardia, unspecified: Secondary | ICD-10-CM | POA: Insufficient documentation

## 2021-07-04 DIAGNOSIS — Z853 Personal history of malignant neoplasm of breast: Secondary | ICD-10-CM | POA: Insufficient documentation

## 2021-07-04 DIAGNOSIS — Z79899 Other long term (current) drug therapy: Secondary | ICD-10-CM | POA: Diagnosis not present

## 2021-07-04 DIAGNOSIS — R5381 Other malaise: Secondary | ICD-10-CM | POA: Diagnosis not present

## 2021-07-04 LAB — CBC WITH DIFFERENTIAL/PLATELET
Abs Immature Granulocytes: 0.06 10*3/uL (ref 0.00–0.07)
Basophils Absolute: 0 10*3/uL (ref 0.0–0.1)
Basophils Relative: 0 %
Eosinophils Absolute: 0 10*3/uL (ref 0.0–0.5)
Eosinophils Relative: 0 %
HCT: 25.5 % — ABNORMAL LOW (ref 36.0–46.0)
Hemoglobin: 8.3 g/dL — ABNORMAL LOW (ref 12.0–15.0)
Immature Granulocytes: 1 %
Lymphocytes Relative: 3 %
Lymphs Abs: 0.3 10*3/uL — ABNORMAL LOW (ref 0.7–4.0)
MCH: 29 pg (ref 26.0–34.0)
MCHC: 32.5 g/dL (ref 30.0–36.0)
MCV: 89.2 fL (ref 80.0–100.0)
Monocytes Absolute: 0.5 10*3/uL (ref 0.1–1.0)
Monocytes Relative: 7 %
Neutro Abs: 7.2 10*3/uL (ref 1.7–7.7)
Neutrophils Relative %: 89 %
Platelets: 122 10*3/uL — ABNORMAL LOW (ref 150–400)
RBC: 2.86 MIL/uL — ABNORMAL LOW (ref 3.87–5.11)
RDW: 18.5 % — ABNORMAL HIGH (ref 11.5–15.5)
WBC: 8.1 10*3/uL (ref 4.0–10.5)
nRBC: 0 % (ref 0.0–0.2)

## 2021-07-04 LAB — COMPREHENSIVE METABOLIC PANEL
ALT: 42 U/L (ref 0–44)
AST: 164 U/L — ABNORMAL HIGH (ref 15–41)
Albumin: 1.8 g/dL — ABNORMAL LOW (ref 3.5–5.0)
Alkaline Phosphatase: 538 U/L — ABNORMAL HIGH (ref 38–126)
Anion gap: 13 (ref 5–15)
BUN: 14 mg/dL (ref 6–20)
CO2: 26 mmol/L (ref 22–32)
Calcium: 7.6 mg/dL — ABNORMAL LOW (ref 8.9–10.3)
Chloride: 102 mmol/L (ref 98–111)
Creatinine, Ser: 0.77 mg/dL (ref 0.44–1.00)
GFR, Estimated: >60 mL/min (ref >60)
Glucose, Bld: 77 mg/dL (ref 70–99)
Potassium: 3.7 mmol/L (ref 3.5–5.1)
Sodium: 141 mmol/L (ref 135–145)
Total Bilirubin: 0.9 mg/dL (ref 0.3–1.2)
Total Protein: 5.4 g/dL — ABNORMAL LOW (ref 6.5–8.1)

## 2021-07-04 LAB — TYPE AND SCREEN
ABO/RH(D): O POS
Antibody Screen: NEGATIVE

## 2021-07-04 MED ORDER — SODIUM CHLORIDE 0.9 % IV BOLUS
2000.0000 mL | Freq: Once | INTRAVENOUS | Status: AC
  Administered 2021-07-04: 2000 mL via INTRAVENOUS

## 2021-07-04 MED ORDER — PIPERACILLIN-TAZOBACTAM 3.375 G IVPB 30 MIN
3.3750 g | Freq: Once | INTRAVENOUS | Status: AC
  Administered 2021-07-04: 3.375 g via INTRAVENOUS
  Filled 2021-07-04: qty 50

## 2021-07-04 MED ORDER — AMOXICILLIN-POT CLAVULANATE 875-125 MG PO TABS: 1.0000 | ORAL_TABLET | Freq: Two times a day (BID) | ORAL | 0 refills | Status: DC

## 2021-07-04 MED ORDER — HYDROMORPHONE HCL 1 MG/ML IJ SOLN
1.0000 mg | Freq: Once | INTRAMUSCULAR | Status: DC
  Filled 2021-07-04: qty 1

## 2021-07-04 NOTE — ED Notes (Signed)
Attempted to obtain EKG. RN asked to come back later due to medication administration. Will re-attempt at a later time.

## 2021-07-04 NOTE — Discharge Instructions (Addendum)
Take Augmentin twice daily for a week for ear infection.  Your heart rate is elevated likely from your breast cancer.  Please follow-up with hospice  We placed a Foley upon your request  Return to ER if you have fever or vomiting or purulent discharge or passing out or Foley not draining

## 2021-07-04 NOTE — ED Provider Notes (Signed)
[facility] DEPT Provider Note   CSN: [id number] Arrival date & time: 07/04/21  2018     History Chief Complaint  Patient presents with   Otalgia    Rebecca [name] is a 45 y.o. female history of end-stage breast cancer palliative care, status post palliative radiation here presenting with right ear pain.  Patient just got out of the hospital several days ago after she was anemic from bleeding from her ulcerations for her breast cancer.  She has seen palliative care and is very clear that she does not want any interventions right now.  Patient states that she is here for her right ear pain.  She was discharged home with doxycycline but she has not been taking it.  She denies any fevers.  She was noted to be tachycardic but states that she does not want any treatment for her breast cancer right now.  She just wants antibiotics for her ear pain.  The history is provided by the patient.      Past Medical History:  Diagnosis Date   Anxiety    Breast cancer ([place])    Depression    GERD (gastroesophageal reflux disease)    History of radiation therapy 11/15/16-01/12/17   right chest wall and axilla treated to 45 Gy in 25 fractions, boosted and additional 14 Gy in 8 fractions   Hypertension    diet controlled   Obesity (BMI 35.0-39.9 without comorbidity)    Pneumonia    as a child   Seasonal allergies    Sickle cell trait ([place])    Termination of pregnancy (fetus) 04/02/16    Patient Active Problem List   Diagnosis Date Noted   Pressure injury of skin 06/22/2021   Severe anemia 06/18/2021   Symptomatic anemia 06/17/2021   GERD without esophagitis 06/17/2021   Cellulitis of finger of left hand 06/17/2021   Anasarca 06/17/2021   Neutropenic fever ([place]) 08/14/2020   Sepsis ([place]) 08/14/2020   Acute lower UTI 08/14/2020   Open chest wound, right, initial encounter 08/14/2020   Anemia associated with chemotherapy 08/14/2020   Neuropathy due to  chemotherapeutic drug ([place]) 07/10/2019   Recurrent breast adenocarcinoma ([place]) 07/02/2019   Bone metastases ([place]) 07/02/2019   Goals of care, counseling/discussion 07/02/2019   Morbid obesity with body mass index (BMI) of 40.0 to 44.9 in adult [facility]) 04/10/2018   Genetic testing 11/20/2016   Family history of breast cancer 11/20/2016   Malignant neoplasm of upper-outer quadrant of right breast in female, estrogen receptor negative ([place]) 02/04/2016   Hirsutism 01/13/2016   Tobacco dependence 01/13/2016   Irregular periods/menstrual cycles 01/13/2016    Past Surgical History:  Procedure Laterality Date   CESAREAN SECTION     2004 and 2007   I & D EXTREMITY Left 06/19/2021   Procedure: IRRIGATION AND DEBRIDEMENT EXTREMITY;  Surgeon: Iran Planas, MD;  Location: [facility];  Service: Orthopedics;  Laterality: Left;   MASTECTOMY W/ SENTINEL NODE BIOPSY Right 09/06/2016   Procedure: RIGHT BREAST MASTECTOMY WITH RIGHT AXILLARY SENTINEL LYMPH NODE BIOPSY;  Surgeon: Alphonsa Overall, MD;  Location: [facility];  Service: General;  Laterality: Right;   PORT-A-CATH REMOVAL Left 09/06/2016   Procedure: REMOVAL PORT-A-CATH;  Surgeon: Alphonsa Overall, MD;  Location: [facility];  Service: General;  Laterality: Left;   PORTACATH PLACEMENT     PORTACATH PLACEMENT N/A 07/09/2019   Procedure: INSERTION PORT-A-CATH WITH ULTRASOUND;  Surgeon: Alphonsa Overall, MD;  Location: [facility];  Service: General;  Laterality: N/A;     OB History   No obstetric history on file.     Family History  Problem Relation Age of Onset   Hypertension Mother    Cancer Mother        dx "intestinal cancer" in her 56s; +surgery   Other Mother        hysterectomy at young age for unspecified cause   Heart Problems Mother    Breast cancer Cousin        maternal 1st cousin dx female breast cancer at 18-46y   Cancer Father    Hypertension Father    Heart Problems Maternal Aunt    Diabetes Maternal Aunt    Breast  cancer Maternal Uncle        dx 64-65   Heart Problems Maternal Uncle    Breast cancer Maternal Grandmother 50   Throat cancer Maternal Grandfather        d. 17s; smoker   Sickle cell anemia Paternal Aunt    Congestive Heart Failure Maternal Aunt    Multiple sclerosis Cousin    Cancer Other        maternal great uncle (MGM's brother); cancer removed from his side   Heart attack Paternal Aunt        d. early 22s    Social History   Tobacco Use   Smoking status: Former    Packs/day: 1.00    Years: 20.00    Pack years: 20.00    Types: Cigarettes    Quit date: 04/01/2018    Years since quitting: 3.2   Smokeless tobacco: Never   Tobacco comments:    Patient has quit smoking x 1 year now  Vaping Use   Vaping Use: Never used  Substance Use Topics   Alcohol use: Yes    Comment: occ   Drug use: No    Home Medications Prior to Admission medications   Medication Sig Start Date End Date Taking? Authorizing Provider  gabapentin (NEURONTIN) 300 MG capsule Take 300 mg by mouth 3 (three) times daily. 03/04/21   [provider]  gelatin adsorbable (GELFOAM/SURGIFOAM) 12-7 MM sponge Apply 1 each topically as needed (apply to bleeding area). 06/24/21   Antonieta Pert, MD  LORazepam (ATIVAN) 0.5 MG tablet Take 1 tablet (0.5 mg total) by mouth daily as needed for anxiety. 06/17/21   Arnaldo Natal, MD  methadone (DOLOPHINE) 5 MG tablet Take 1 tablet (5 mg total) by mouth every 8 (eight) hours. 06/17/21   Arnaldo Natal, MD  metoprolol tartrate (LOPRESSOR) 25 MG tablet Take 0.5 tablets (12.5 mg total) by mouth 2 (two) times daily. 06/24/21 08/23/21  Antonieta Pert, MD  metroNIDAZOLE (FLAGYL) 500 MG tablet Apply 1000 mg crushed on chest wall topical 06/24/21   Antonieta Pert, MD  naproxen sodium (ALEVE) 220 MG tablet Take 220 mg by mouth daily as needed (pain).    [provider]  ondansetron (ZOFRAN ODT) 4 MG disintegrating tablet Take 1 tablet (4 mg total) by mouth every 8 (eight) hours as  needed for nausea or vomiting. 06/17/21   Arnaldo Natal, MD  oxyCODONE (OXY IR/ROXICODONE) 5 MG immediate release tablet Take 1 tablet (5 mg total) by mouth 2 (two) times daily as needed. Patient taking differently: Take 5 mg by mouth 2 (two) times daily as needed for severe pain. 06/17/21   Arnaldo Natal, MD  polyethylene glycol (MIRALAX / GLYCOLAX) 17 g packet Take 17 g by mouth daily as needed for  up to 14 doses for moderate constipation. 06/24/21   Antonieta Pert, MD  senna-docusate (SENOKOT-S) 8.6-50 MG tablet Take 1 tablet by mouth 2 (two) times daily. 06/24/21 07/24/21  Antonieta Pert, MD    Allergies    Patient has no known allergies.  Review of Systems   Review of Systems  HENT:  Positive for ear pain.   All other systems reviewed and are negative.  Physical Exam Updated Vital Signs BP 123/85 (BP Location: Left Arm)   Pulse (!) 156   Temp 98.1 F (36.7 C) (Oral)   Resp 18   Ht $R'5\' 1"'PK$  (1.549 m)   Wt 69.5 kg   SpO2 98%   BMI 28.95 kg/m   Physical Exam Vitals and nursing note reviewed.  Constitutional:      Comments: Chronically ill-appearing.  HENT:     Head:     Comments: Chronically ill     Ears:     Comments: R TM slightly bulging and red, L TM nl     Mouth/Throat:     Mouth: Mucous membranes are dry.  Eyes:     Extraocular Movements: Extraocular movements intact.     Pupils: Pupils are equal, round, and reactive to light.  Cardiovascular:     Rate and Rhythm: Regular rhythm. Tachycardia present.     Pulses: Normal pulses.     Heart sounds: Normal heart sounds.  Pulmonary:     Effort: Pulmonary effort is normal.     Breath sounds: Normal breath sounds.     Comments: Patient has large ulcer on the right breast with drainage.  Patient also has small ulcers on the left breast with some purulent drainage.  Per patient, this is chronic Abdominal:     General: Abdomen is flat.     Palpations: Abdomen is soft.  Musculoskeletal:        General: Normal range of motion.      Cervical back: Normal range of motion and neck supple.  Skin:    General: Skin is warm.     Capillary Refill: Capillary refill takes less than 2 seconds.  Neurological:     General: No focal deficit present.     Mental Status: She is oriented to person, place, and time.  Psychiatric:        Mood and Affect: Mood normal.        Behavior: Behavior normal.    ED Results / Procedures / Treatments   Labs (all labs ordered are listed, but only abnormal results are displayed) Labs Reviewed  CBC WITH DIFFERENTIAL/PLATELET  COMPREHENSIVE METABOLIC PANEL  TYPE AND SCREEN    EKG None  Radiology No results found.  Procedures Procedures   Medications Ordered in ED Medications  piperacillin-tazobactam (ZOSYN) IVPB 3.375 g (has no administration in time range)  sodium chloride 0.9 % bolus 2,000 mL (has no administration in time range)  HYDROmorphone (DILAUDID) injection 1 mg (has no administration in time range)    ED Course  I have reviewed the triage vital signs and the nursing notes.  Pertinent labs & imaging results that were available during my care of the patient were reviewed by me and considered in my medical decision making (see chart for details).    MDM Rules/Calculators/A&P                          Darly Vanetta [name] is a 45 y.o. female here presenting with right ear pain.  Patient does  have right otitis media.  Patient recently admitted for anemia from her breast wounds.  Patient states that she does not want any further treatment for breast cancer right now.  She does understand that she is dying. She states that she just wants antibiotics for her right here infection. Right now patient is tachycardic to 150s.  I told her that we can access her port and give her some fluids and pain medicine and antibiotics.  We will give Zosyn empirically.  11:13 PM Patient's white blood cell count is normal.  Hemoglobin is 8.3 (was 6.5 when she was admitted last time).  Heart  rate down to 120s.  Chemistry show elevated alk phos which is consistent with her bony mets.  Patient told me that she was told by hospice nurse that she needs to get a Foley as well.  She does have home care set up already.  We will give her Augmentin for otitis media.  Will discharge home after she gets foley    Final Clinical Impression(s) / ED Diagnoses Final diagnoses:  None    Rx / DC Orders ED Discharge Orders     None        Drenda Freeze, MD 07/04/21 2315

## 2021-07-04 NOTE — ED Triage Notes (Signed)
Patient BIB [facility] for right ear pain. EMS reports concern for malodorous tumors on chest. Patient currently is being treated for breast cancer. EMS reports pt's HR 160, sinus tachycardia on monitor. Patient appeared to be getting anxious regarding he night time carvedilol.

## 2021-07-05 ENCOUNTER — Ambulatory Visit: Payer: Medicaid Other

## 2021-07-05 MED ORDER — SODIUM CHLORIDE 0.9% FLUSH: 10.0000 mL | Freq: Two times a day (BID) | INTRAVENOUS | Status: DC

## 2021-07-05 MED ORDER — CHLORHEXIDINE GLUCONATE CLOTH 2 % EX PADS: 6.0000 | MEDICATED_PAD | Freq: Every day | CUTANEOUS | Status: DC

## 2021-07-05 MED ORDER — SODIUM CHLORIDE 0.9% FLUSH: 10.0000 mL | INTRAVENOUS | Status: DC | PRN

## 2021-07-06 ENCOUNTER — Ambulatory Visit: Payer: Medicaid Other

## 2021-07-07 ENCOUNTER — Ambulatory Visit: Payer: Medicaid Other

## 2021-07-08 ENCOUNTER — Ambulatory Visit: Payer: Medicaid Other

## 2021-07-09 ENCOUNTER — Ambulatory Visit: Payer: Medicaid Other

## 2021-07-13 ENCOUNTER — Encounter (HOSPITAL_COMMUNITY): Payer: Self-pay

## 2021-07-13 ENCOUNTER — Inpatient Hospital Stay (HOSPITAL_COMMUNITY)
Admission: EM | Admit: 2021-07-13 | Discharge: 2021-08-05 | DRG: 871 | Disposition: E | Payer: Medicaid Other | Attending: Internal Medicine | Admitting: Internal Medicine

## 2021-07-13 ENCOUNTER — Emergency Department (HOSPITAL_COMMUNITY): Payer: Medicaid Other

## 2021-07-13 DIAGNOSIS — C7951 Secondary malignant neoplasm of bone: Secondary | ICD-10-CM | POA: Diagnosis not present

## 2021-07-13 DIAGNOSIS — Z66 Do not resuscitate: Secondary | ICD-10-CM | POA: Diagnosis present

## 2021-07-13 DIAGNOSIS — K219 Gastro-esophageal reflux disease without esophagitis: Secondary | ICD-10-CM | POA: Diagnosis present

## 2021-07-13 DIAGNOSIS — I96 Gangrene, not elsewhere classified: Secondary | ICD-10-CM | POA: Diagnosis not present

## 2021-07-13 DIAGNOSIS — Z87891 Personal history of nicotine dependence: Secondary | ICD-10-CM | POA: Diagnosis not present

## 2021-07-13 DIAGNOSIS — Z803 Family history of malignant neoplasm of breast: Secondary | ICD-10-CM

## 2021-07-13 DIAGNOSIS — Z853 Personal history of malignant neoplasm of breast: Secondary | ICD-10-CM

## 2021-07-13 DIAGNOSIS — R6521 Severe sepsis with septic shock: Secondary | ICD-10-CM | POA: Diagnosis present

## 2021-07-13 DIAGNOSIS — Z833 Family history of diabetes mellitus: Secondary | ICD-10-CM | POA: Diagnosis not present

## 2021-07-13 DIAGNOSIS — Z832 Family history of diseases of the blood and blood-forming organs and certain disorders involving the immune mechanism: Secondary | ICD-10-CM

## 2021-07-13 DIAGNOSIS — Z9221 Personal history of antineoplastic chemotherapy: Secondary | ICD-10-CM

## 2021-07-13 DIAGNOSIS — E162 Hypoglycemia, unspecified: Secondary | ICD-10-CM | POA: Diagnosis present

## 2021-07-13 DIAGNOSIS — Z808 Family history of malignant neoplasm of other organs or systems: Secondary | ICD-10-CM

## 2021-07-13 DIAGNOSIS — R0602 Shortness of breath: Secondary | ICD-10-CM | POA: Diagnosis not present

## 2021-07-13 DIAGNOSIS — D61818 Other pancytopenia: Secondary | ICD-10-CM | POA: Diagnosis present

## 2021-07-13 DIAGNOSIS — Z171 Estrogen receptor negative status [ER-]: Secondary | ICD-10-CM

## 2021-07-13 DIAGNOSIS — Z8249 Family history of ischemic heart disease and other diseases of the circulatory system: Secondary | ICD-10-CM

## 2021-07-13 DIAGNOSIS — R6520 Severe sepsis without septic shock: Secondary | ICD-10-CM | POA: Diagnosis not present

## 2021-07-13 DIAGNOSIS — C761 Malignant neoplasm of thorax: Secondary | ICD-10-CM | POA: Diagnosis not present

## 2021-07-13 DIAGNOSIS — N179 Acute kidney failure, unspecified: Secondary | ICD-10-CM | POA: Diagnosis not present

## 2021-07-13 DIAGNOSIS — D649 Anemia, unspecified: Secondary | ICD-10-CM | POA: Diagnosis not present

## 2021-07-13 DIAGNOSIS — C50919 Malignant neoplasm of unspecified site of unspecified female breast: Secondary | ICD-10-CM | POA: Diagnosis not present

## 2021-07-13 DIAGNOSIS — Z923 Personal history of irradiation: Secondary | ICD-10-CM | POA: Diagnosis not present

## 2021-07-13 DIAGNOSIS — F32A Depression, unspecified: Secondary | ICD-10-CM | POA: Diagnosis present

## 2021-07-13 DIAGNOSIS — Z79899 Other long term (current) drug therapy: Secondary | ICD-10-CM

## 2021-07-13 DIAGNOSIS — E872 Acidosis: Secondary | ICD-10-CM | POA: Diagnosis not present

## 2021-07-13 DIAGNOSIS — A419 Sepsis, unspecified organism: Principal | ICD-10-CM | POA: Diagnosis present

## 2021-07-13 DIAGNOSIS — Z515 Encounter for palliative care: Secondary | ICD-10-CM

## 2021-07-13 DIAGNOSIS — R000 Tachycardia, unspecified: Secondary | ICD-10-CM | POA: Diagnosis not present

## 2021-07-13 DIAGNOSIS — F419 Anxiety disorder, unspecified: Secondary | ICD-10-CM | POA: Diagnosis present

## 2021-07-13 DIAGNOSIS — D573 Sickle-cell trait: Secondary | ICD-10-CM | POA: Diagnosis present

## 2021-07-13 DIAGNOSIS — Z9011 Acquired absence of right breast and nipple: Secondary | ICD-10-CM | POA: Diagnosis not present

## 2021-07-13 DIAGNOSIS — I10 Essential (primary) hypertension: Secondary | ICD-10-CM | POA: Diagnosis present

## 2021-07-13 DIAGNOSIS — Z7189 Other specified counseling: Secondary | ICD-10-CM | POA: Diagnosis not present

## 2021-07-13 DIAGNOSIS — R531 Weakness: Secondary | ICD-10-CM | POA: Diagnosis not present

## 2021-07-13 DIAGNOSIS — Z20822 Contact with and (suspected) exposure to covid-19: Secondary | ICD-10-CM | POA: Diagnosis not present

## 2021-07-13 DIAGNOSIS — R627 Adult failure to thrive: Secondary | ICD-10-CM | POA: Diagnosis present

## 2021-07-13 DIAGNOSIS — E161 Other hypoglycemia: Secondary | ICD-10-CM | POA: Diagnosis not present

## 2021-07-13 DIAGNOSIS — R591 Generalized enlarged lymph nodes: Secondary | ICD-10-CM | POA: Diagnosis not present

## 2021-07-13 DIAGNOSIS — G893 Neoplasm related pain (acute) (chronic): Secondary | ICD-10-CM | POA: Diagnosis not present

## 2021-07-13 LAB — CBC WITH DIFFERENTIAL/PLATELET
Abs Immature Granulocytes: 0.24 10*3/uL — ABNORMAL HIGH (ref 0.00–0.07)
Basophils Absolute: 0 10*3/uL (ref 0.0–0.1)
Basophils Relative: 0 %
Eosinophils Absolute: 0.2 10*3/uL (ref 0.0–0.5)
Eosinophils Relative: 1 %
HCT: 24.5 % — ABNORMAL LOW (ref 36.0–46.0)
Hemoglobin: 7.8 g/dL — ABNORMAL LOW (ref 12.0–15.0)
Immature Granulocytes: 1 %
Lymphocytes Relative: 2 %
Lymphs Abs: 0.3 10*3/uL — ABNORMAL LOW (ref 0.7–4.0)
MCH: 29.2 pg (ref 26.0–34.0)
MCHC: 31.8 g/dL (ref 30.0–36.0)
MCV: 91.8 fL (ref 80.0–100.0)
Monocytes Absolute: 0.4 10*3/uL (ref 0.1–1.0)
Monocytes Relative: 2 %
Neutro Abs: 17.1 10*3/uL — ABNORMAL HIGH (ref 1.7–7.7)
Neutrophils Relative %: 94 %
Platelets: 35 10*3/uL — ABNORMAL LOW (ref 150–400)
RBC: 2.67 MIL/uL — ABNORMAL LOW (ref 3.87–5.11)
RDW: 20.3 % — ABNORMAL HIGH (ref 11.5–15.5)
WBC: 18.2 10*3/uL — ABNORMAL HIGH (ref 4.0–10.5)
nRBC: 0.2 % (ref 0.0–0.2)

## 2021-07-13 LAB — COMPREHENSIVE METABOLIC PANEL
ALT: 103 U/L — ABNORMAL HIGH (ref 0–44)
AST: 358 U/L — ABNORMAL HIGH (ref 15–41)
Albumin: 1.7 g/dL — ABNORMAL LOW (ref 3.5–5.0)
Alkaline Phosphatase: 677 U/L — ABNORMAL HIGH (ref 38–126)
Anion gap: 29 — ABNORMAL HIGH (ref 5–15)
BUN: 29 mg/dL — ABNORMAL HIGH (ref 6–20)
CO2: 11 mmol/L — ABNORMAL LOW (ref 22–32)
Calcium: 7.6 mg/dL — ABNORMAL LOW (ref 8.9–10.3)
Chloride: 101 mmol/L (ref 98–111)
Creatinine, Ser: 1.67 mg/dL — ABNORMAL HIGH (ref 0.44–1.00)
GFR, Estimated: 38 mL/min — ABNORMAL LOW (ref >60)
Glucose, Bld: 62 mg/dL — ABNORMAL LOW (ref 70–99)
Potassium: 4.6 mmol/L (ref 3.5–5.1)
Sodium: 141 mmol/L (ref 135–145)
Total Bilirubin: 3.3 mg/dL — ABNORMAL HIGH (ref 0.3–1.2)
Total Protein: 5.5 g/dL — ABNORMAL LOW (ref 6.5–8.1)

## 2021-07-13 LAB — RESP PANEL BY RT-PCR (FLU A&B, COVID) ARPGX2
Influenza A by PCR: NEGATIVE
Influenza B by PCR: NEGATIVE
SARS Coronavirus 2 by RT PCR: NEGATIVE

## 2021-07-13 LAB — PREPARE RBC (CROSSMATCH)

## 2021-07-13 LAB — MRSA NEXT GEN BY PCR, NASAL: MRSA by PCR Next Gen: NOT DETECTED

## 2021-07-13 MED ORDER — DEXTROSE 50 % IV SOLN
25.0000 mL | Freq: Once | INTRAVENOUS | Status: AC
  Administered 2021-07-13: 25 mL via INTRAVENOUS
  Filled 2021-07-13: qty 50

## 2021-07-13 MED ORDER — SODIUM CHLORIDE 0.9 % IV BOLUS (SEPSIS)
1000.0000 mL | Freq: Once | INTRAVENOUS | Status: AC
  Administered 2021-07-13: 1000 mL via INTRAVENOUS

## 2021-07-13 MED ORDER — ONDANSETRON HCL 4 MG PO TABS: 4.0000 mg | ORAL_TABLET | Freq: Four times a day (QID) | ORAL | Status: DC | PRN

## 2021-07-13 MED ORDER — SODIUM CHLORIDE 0.9 % IV SOLN
2.0000 g | Freq: Two times a day (BID) | INTRAVENOUS | Status: DC
  Administered 2021-07-14 (×2): 2 g via INTRAVENOUS
  Filled 2021-07-13 (×2): qty 2

## 2021-07-13 MED ORDER — SODIUM CHLORIDE 0.9 % IV BOLUS: 1000.0000 mL | Freq: Once | INTRAVENOUS | Status: DC

## 2021-07-13 MED ORDER — VANCOMYCIN HCL 1500 MG/300ML IV SOLN
1500.0000 mg | Freq: Once | INTRAVENOUS | Status: DC
  Filled 2021-07-13: qty 300

## 2021-07-13 MED ORDER — HYDROMORPHONE HCL 1 MG/ML IJ SOLN
1.0000 mg | Freq: Once | INTRAMUSCULAR | Status: AC
  Administered 2021-07-13: 1 mg via INTRAVENOUS
  Filled 2021-07-13: qty 1

## 2021-07-13 MED ORDER — ONDANSETRON HCL 4 MG/2ML IJ SOLN
4.0000 mg | Freq: Once | INTRAMUSCULAR | Status: AC
  Administered 2021-07-13: 4 mg via INTRAVENOUS
  Filled 2021-07-13: qty 2

## 2021-07-13 MED ORDER — SODIUM CHLORIDE 0.9 % IV SOLN
10.0000 mL/h | Freq: Once | INTRAVENOUS | Status: AC
  Administered 2021-07-13: 10 mL/h via INTRAVENOUS

## 2021-07-13 MED ORDER — DEXTROSE 10 % IV SOLN: INTRAVENOUS | Status: DC

## 2021-07-13 MED ORDER — SODIUM CHLORIDE 0.9 % IV BOLUS
500.0000 mL | Freq: Once | INTRAVENOUS | Status: AC
  Administered 2021-07-13: 500 mL via INTRAVENOUS

## 2021-07-13 MED ORDER — VANCOMYCIN HCL 500 MG/100ML IV SOLN
500.0000 mg | Freq: Once | INTRAVENOUS | Status: AC
  Administered 2021-07-13: 500 mg via INTRAVENOUS
  Filled 2021-07-13: qty 100

## 2021-07-13 MED ORDER — MORPHINE SULFATE (PF) 4 MG/ML IV SOLN
4.0000 mg | INTRAVENOUS | Status: DC | PRN
  Administered 2021-07-13: 4 mg via INTRAVENOUS
  Filled 2021-07-13: qty 1

## 2021-07-13 MED ORDER — VANCOMYCIN VARIABLE DOSE PER UNSTABLE RENAL FUNCTION (PHARMACIST DOSING): Status: DC

## 2021-07-13 MED ORDER — HYDROMORPHONE HCL 1 MG/ML IJ SOLN
0.5000 mg | Freq: Once | INTRAMUSCULAR | Status: AC
  Administered 2021-07-13: 0.5 mg via INTRAVENOUS
  Filled 2021-07-13: qty 1

## 2021-07-13 MED ORDER — VANCOMYCIN HCL IN DEXTROSE 1-5 GM/200ML-% IV SOLN
1000.0000 mg | Freq: Once | INTRAVENOUS | Status: AC
  Administered 2021-07-13: 1000 mg via INTRAVENOUS
  Filled 2021-07-13: qty 200

## 2021-07-13 MED ORDER — ONDANSETRON HCL 4 MG/2ML IJ SOLN: 4.0000 mg | Freq: Four times a day (QID) | INTRAMUSCULAR | Status: DC | PRN

## 2021-07-13 MED ORDER — SODIUM CHLORIDE 0.9 % IV BOLUS (SEPSIS): 250.0000 mL | Freq: Once | INTRAVENOUS | Status: AC

## 2021-07-13 NOTE — H&P (Signed)
History and Physical    Rebecca [name] [id number] DOB: 1976-09-14 DOA: 07/23/2021  PCP: Dorena Dew, FNP  Patient coming from: Home  Chief Complaint: weakness, confusion  HPI: Rebecca [name] is a 45 y.o. female with medical history significant of breast cancer , HTN, depression. History is from mother as patient is confused and not fully participating in interview/exam. She lives with her mother. Per her mother's report, the patient has stopped eating. She still will drink fluids, but refused to eat at this point. She had been in more pain over the last several days. During that same time she has become more confused. Her condition has deteriorated to such that she is unable to be cared for at home. Her mother had her brought to the ED for assistance.    ED Course: Lab work showed elevated WBC, elevated Scr, hypoglycemia. She was started on vanc. EDP spoke with onco. TRH was called for admission.   Review of Systems:  Unable to obtain d/t mentation.   PMHx Past Medical History:  Diagnosis Date   Anxiety    Breast cancer ([place])    Depression    GERD (gastroesophageal reflux disease)    History of radiation therapy 11/15/16-01/12/17   right chest wall and axilla treated to 45 Gy in 25 fractions, boosted and additional 14 Gy in 8 fractions   Hypertension    diet controlled   Obesity (BMI 35.0-39.9 without comorbidity)    Pneumonia    as a child   Seasonal allergies    Sickle cell trait ([place])    Termination of pregnancy (fetus) 04/02/16    PSHx Past Surgical History:  Procedure Laterality Date   CESAREAN SECTION     2004 and 2007   I & D EXTREMITY Left 06/19/2021   Procedure: IRRIGATION AND DEBRIDEMENT EXTREMITY;  Surgeon: Iran Planas, MD;  [facility];  Service: Orthopedics;  Laterality: Left;   MASTECTOMY W/ SENTINEL NODE BIOPSY Right 09/06/2016   Procedure: RIGHT BREAST MASTECTOMY WITH RIGHT AXILLARY SENTINEL LYMPH NODE BIOPSY;  Surgeon: Alphonsa Overall, MD;  Location: [facility];  Service: General;  Laterality: Right;   PORT-A-CATH REMOVAL Left 09/06/2016   Procedure: REMOVAL PORT-A-CATH;  Surgeon: Alphonsa Overall, MD;  Location: [facility];  Service: General;  Laterality: Left;   PORTACATH PLACEMENT     PORTACATH PLACEMENT N/A 07/09/2019   Procedure: INSERTION PORT-A-CATH WITH ULTRASOUND;  Surgeon: Alphonsa Overall, MD;  Location: [facility];  Service: General;  Laterality: N/A;    SocHx  reports that she quit smoking about 3 years ago. Her smoking use included cigarettes. She has a 20.00 pack-year smoking history. She has never used smokeless tobacco. She reports current alcohol use. She reports that she does not use drugs.  No Known Allergies  FamHx Family History  Problem Relation Age of Onset   Hypertension Mother    Cancer Mother        dx "intestinal cancer" in her 66s; +surgery   Other Mother        hysterectomy at young age for unspecified cause   Heart Problems Mother    Breast cancer Cousin        maternal 1st cousin dx female breast cancer at 58-46y   Cancer Father    Hypertension Father    Heart Problems Maternal Aunt    Diabetes Maternal Aunt    Breast cancer Maternal Uncle        dx 64-65   Heart Problems  Maternal Uncle    Breast cancer Maternal Grandmother 50   Throat cancer Maternal Grandfather        d. 61s; smoker   Sickle cell anemia Paternal Aunt    Congestive Heart Failure Maternal Aunt    Multiple sclerosis Cousin    Cancer Other        maternal great uncle (MGM's brother); cancer removed from his side   Heart attack Paternal Aunt        d. early 85s    Prior to Admission medications   Medication Sig Start Date End Date Taking? Authorizing Provider  amoxicillin-clavulanate (AUGMENTIN) 875-125 MG tablet Take 1 tablet by mouth 2 (two) times daily. One po bid x 7 days 07/04/21   Drenda Freeze, MD  gabapentin (NEURONTIN) 300 MG capsule Take 300 mg by mouth 3 (three) times  daily. 03/04/21   [provider]  gelatin adsorbable (GELFOAM/SURGIFOAM) 12-7 MM sponge Apply 1 each topically as needed (apply to bleeding area). 06/24/21   Antonieta Pert, MD  LORazepam (ATIVAN) 0.5 MG tablet Take 1 tablet (0.5 mg total) by mouth daily as needed for anxiety. 06/17/21   Arnaldo Natal, MD  methadone (DOLOPHINE) 5 MG tablet Take 1 tablet (5 mg total) by mouth every 8 (eight) hours. 06/17/21   Arnaldo Natal, MD  metoprolol tartrate (LOPRESSOR) 25 MG tablet Take 0.5 tablets (12.5 mg total) by mouth 2 (two) times daily. 06/24/21 08/23/21  Antonieta Pert, MD  metroNIDAZOLE (FLAGYL) 500 MG tablet Apply 1000 mg crushed on chest wall topical 06/24/21   Antonieta Pert, MD  naproxen sodium (ALEVE) 220 MG tablet Take 220 mg by mouth daily as needed (pain).    [provider]  ondansetron (ZOFRAN ODT) 4 MG disintegrating tablet Take 1 tablet (4 mg total) by mouth every 8 (eight) hours as needed for nausea or vomiting. 06/17/21   Arnaldo Natal, MD  oxyCODONE (OXY IR/ROXICODONE) 5 MG immediate release tablet Take 1 tablet (5 mg total) by mouth 2 (two) times daily as needed. Patient taking differently: Take 5 mg by mouth 2 (two) times daily as needed for severe pain. 06/17/21   Arnaldo Natal, MD  polyethylene glycol (MIRALAX / GLYCOLAX) 17 g packet Take 17 g by mouth daily as needed for up to 14 doses for moderate constipation. 06/24/21   Antonieta Pert, MD  Polyethylene Glycol 3350 (PEG 3350) 17 GM/SCOOP POWD Take 17 g by mouth daily as needed for constipation. 06/24/21   [provider]  senna-docusate (SENOKOT-S) 8.6-50 MG tablet Take 1 tablet by mouth 2 (two) times daily. 06/24/21 07/24/21  Antonieta Pert, MD    Physical Exam: Vitals:   07/25/2021 1236 07/17/2021 1330 08/02/2021 1408 07/12/2021 1503  BP: 114/74 103/66  112/72  Pulse: (!) 123 (!) 106 (!) 110 (!) 110  Resp: '18 20 16 18  '$ SpO2: 100% 100% 100% 100%    General: 45 y.o. ill appearing female resting in bed Eyes: PERRL, normal  sclera ENMT: Nares patent w/o discharge, orophaynx clear, dentition normal, ears w/o discharge/lesions/ulcers Neck: Supple, trachea midline Cardiovascular: tachy, +S1, S2, no m/g/r, equal pulses throughout Respiratory: scattered wheeze, upper airway transmission GI: BS hypoactive, NDNT, no masses noted, no organomegaly noted MSK: No c/c; diffuse edema BUE/BLE; fungating open wounds on left breast, open chest wound on right w/ exposure of ribs, pleura Neuro: alert but not following commands  Labs on Admission: I have personally reviewed following labs and imaging studies  CBC: Recent Labs  Lab  07/16/2021 1218  WBC 18.2*  NEUTROABS 17.1*  HGB 7.8*  HCT 24.5*  MCV 91.8  PLT 35*   Basic Metabolic Panel: Recent Labs  Lab 07/27/2021 1218  NA 141  K 4.6  CL 101  CO2 11*  GLUCOSE 62*  BUN 29*  CREATININE 1.67*  CALCIUM 7.6*   GFR: Estimated Creatinine Clearance: 37.1 mL/min (A) (by C-G formula based on SCr of 1.67 mg/dL (H)). Liver Function Tests: Recent Labs  Lab 07/10/2021 1218  AST 358*  ALT 103*  ALKPHOS 677*  BILITOT 3.3*  PROT 5.5*  ALBUMIN 1.7*   No results for input(s): LIPASE, AMYLASE in the last 168 hours. No results for input(s): AMMONIA in the last 168 hours. Coagulation Profile: No results for input(s): INR, PROTIME in the last 168 hours. Cardiac Enzymes: No results for input(s): CKTOTAL, CKMB, CKMBINDEX, TROPONINI in the last 168 hours. BNP (last 3 results) No results for input(s): PROBNP in the last 8760 hours. HbA1C: No results for input(s): HGBA1C in the last 72 hours. CBG: No results for input(s): GLUCAP in the last 168 hours. Lipid Profile: No results for input(s): CHOL, HDL, LDLCALC, TRIG, CHOLHDL, LDLDIRECT in the last 72 hours. Thyroid Function Tests: No results for input(s): TSH, T4TOTAL, FREET4, T3FREE, THYROIDAB in the last 72 hours. Anemia Panel: No results for input(s): VITAMINB12, FOLATE, FERRITIN, TIBC, IRON, RETICCTPCT in the last 72  hours. Urine analysis:    Component Value Date/Time   COLORURINE YELLOW 08/14/2020 1435   APPEARANCEUR CLOUDY (A) 08/14/2020 1435   LABSPEC 1.012 08/14/2020 1435   PHURINE 5.0 08/14/2020 1435   GLUCOSEU NEGATIVE 08/14/2020 1435   HGBUR NEGATIVE 08/14/2020 1435   BILIRUBINUR NEGATIVE 08/14/2020 1435   KETONESUR NEGATIVE 08/14/2020 1435   PROTEINUR 100 (A) 08/14/2020 1435   UROBILINOGEN 0.2 01/12/2016 1122   NITRITE NEGATIVE 08/14/2020 1435   LEUKOCYTESUR LARGE (A) 08/14/2020 1435    Radiological Exams on Admission: DG Chest Port 1 View  Result Date: 07/06/2021 CLINICAL DATA:  Shortness of breath. History of breast cancer per prior radiology records. EXAM: PORTABLE CHEST 1 VIEW COMPARISON:  CT chest 10/06/2020. Prior chest radiographs 08/14/2020 and earlier. FINDINGS: Left chest infusion port catheter with tip projecting at the level of the superior cavoatrial junction. Shallow inspiration radiograph. Heart size within normal limits. Fullness of the right hilum, which may reflect previously demonstrated right hilar lymphadenopathy. No appreciable airspace consolidation or pulmonary edema. No evidence of pleural effusion or pneumothorax. No acute bony abnormality identified. IMPRESSION: Shallow inspiration radiograph. No appreciable airspace consolidation or pulmonary edema. Fullness of the right hilum, which may reflect previously demonstrated right hilar lymphadenopathy. Electronically Signed   By: Kellie Simmering DO   On: 07/05/2021 13:01    EKG: None obtained in ED.   Assessment/Plan Severe sepsis secondary to infected chest wounds     - admit to inpt SDU     - PCCM consulted, appreciate assistance     - vanc, cefepime, fluids     - follow lactic acid     - she wished to remain full code     - will get PC consult     - onco to see her in the AM     - as far as wounds go, spoke with gen surg and CTS; no ability to close these; not really a candidate for any surgery; rec'd WOCN for  dressings; WOCN consulted  Hx of breast cancer     - follows w/ Dr Griffith Citron; he will follow  Normocytic anemia     - 2 units pRBCs ordered, follow  AKI Elevated LFTs Metabolic acidosis Thrombocytopenia Hypoglycemia Hypothermia     - all a function of sepsis     - fluids, abx as above     - D10 NS     - bear hugger  Goals of care     - patient wishes to remain FULL     - not expected to survive this admission     - PC consult  DVT prophylaxis: SCDs  Code Status: FULL  Family Communication: spoke w/ mother by phone  Consults called: PCCM   Status is: Inpatient  Remains inpatient appropriate because:Inpatient level of care appropriate due to severity of illness  Dispo: The patient is from: Home              Anticipated d/c is to:  TBD              Patient currently is not medically stable to d/c.   Difficult to place patient No  Time spent coordinating admission: 90 minutes  [facility]  If 7PM-7AM, please contact night-coverage [email or web address]  07/22/2021, 4:08 PM

## 2021-07-13 NOTE — Progress Notes (Signed)
Spoke with patient's mother  Did discuss patient's ongoing suffering  I recommended DNR  Patient will be DNR at present Resuscitative efforts will be futile and will not be in the best interest of the patient

## 2021-07-13 NOTE — ED Provider Notes (Signed)
[facility] DEPT Provider Note   CSN: [id number] Arrival date & time: 07/23/2021  1005     History No chief complaint on file.   Rebecca [name] is a 45 y.o. female.  Patient with metastatic breast cancer.  She has a open wound to her right chest and left chest wound which bleeding.  Patient still wants everything done although she is a hospice patient.  Her mother brought her here because she cannot take care of her  The history is provided by the patient and medical records. No language interpreter was used.  Weakness Severity:  Moderate Onset quality:  Sudden Timing:  Constant Progression:  Worsening Chronicity:  New Context: not alcohol use   Relieved by:  Nothing Worsened by:  Nothing Ineffective treatments:  None tried Associated symptoms: no abdominal pain, no chest pain, no cough, no diarrhea, no frequency, no headaches and no seizures       Past Medical History:  Diagnosis Date   Anxiety    Breast cancer ([place])    Depression    GERD (gastroesophageal reflux disease)    History of radiation therapy 11/15/16-01/12/17   right chest wall and axilla treated to 45 Gy in 25 fractions, boosted and additional 14 Gy in 8 fractions   Hypertension    diet controlled   Obesity (BMI 35.0-39.9 without comorbidity)    Pneumonia    as a child   Seasonal allergies    Sickle cell trait ([place])    Termination of pregnancy (fetus) 04/02/16    Patient Active Problem List   Diagnosis Date Noted   Pressure injury of skin 06/22/2021   Severe anemia 06/18/2021   Symptomatic anemia 06/17/2021   GERD without esophagitis 06/17/2021   Cellulitis of finger of left hand 06/17/2021   Anasarca 06/17/2021   Neutropenic fever ([place]) 08/14/2020   Sepsis ([place]) 08/14/2020   Acute lower UTI 08/14/2020   Open chest wound, right, initial encounter 08/14/2020   Anemia associated with chemotherapy 08/14/2020   Neuropathy due to chemotherapeutic drug ([place])  07/10/2019   Recurrent breast adenocarcinoma ([place]) 07/02/2019   Bone metastases ([place]) 07/02/2019   Goals of care, counseling/discussion 07/02/2019   Morbid obesity with body mass index (BMI) of 40.0 to 44.9 in adult [facility]) 04/10/2018   Genetic testing 11/20/2016   Family history of breast cancer 11/20/2016   Malignant neoplasm of upper-outer quadrant of right breast in female, estrogen receptor negative ([place]) 02/04/2016   Hirsutism 01/13/2016   Tobacco dependence 01/13/2016   Irregular periods/menstrual cycles 01/13/2016    Past Surgical History:  Procedure Laterality Date   CESAREAN SECTION     2004 and 2007   I & D EXTREMITY Left 06/19/2021   Procedure: IRRIGATION AND DEBRIDEMENT EXTREMITY;  Surgeon: Iran Planas, MD;  Location: [facility];  Service: Orthopedics;  Laterality: Left;   MASTECTOMY W/ SENTINEL NODE BIOPSY Right 09/06/2016   Procedure: RIGHT BREAST MASTECTOMY WITH RIGHT AXILLARY SENTINEL LYMPH NODE BIOPSY;  Surgeon: Alphonsa Overall, MD;  Location: [facility];  Service: General;  Laterality: Right;   PORT-A-CATH REMOVAL Left 09/06/2016   Procedure: REMOVAL PORT-A-CATH;  Surgeon: Alphonsa Overall, MD;  Location: [facility];  Service: General;  Laterality: Left;   PORTACATH PLACEMENT     PORTACATH PLACEMENT N/A 07/09/2019   Procedure: INSERTION PORT-A-CATH WITH ULTRASOUND;  Surgeon: Alphonsa Overall, MD;  Location: [facility];  Service: General;  Laterality: N/A;     OB History   No obstetric history on  file.     Family History  Problem Relation Age of Onset   Hypertension Mother    Cancer Mother        dx "intestinal cancer" in her 46s; +surgery   Other Mother        hysterectomy at young age for unspecified cause   Heart Problems Mother    Breast cancer Cousin        maternal 1st cousin dx female breast cancer at 54-46y   Cancer Father    Hypertension Father    Heart Problems Maternal Aunt    Diabetes Maternal Aunt    Breast cancer Maternal Uncle         dx 64-65   Heart Problems Maternal Uncle    Breast cancer Maternal Grandmother 50   Throat cancer Maternal Grandfather        d. 84s; smoker   Sickle cell anemia Paternal Aunt    Congestive Heart Failure Maternal Aunt    Multiple sclerosis Cousin    Cancer Other        maternal great uncle (MGM's brother); cancer removed from his side   Heart attack Paternal Aunt        d. early 24s    Social History   Tobacco Use   Smoking status: Former    Packs/day: 1.00    Years: 20.00    Pack years: 20.00    Types: Cigarettes    Quit date: 04/01/2018    Years since quitting: 3.2   Smokeless tobacco: Never   Tobacco comments:    Patient has quit smoking x 1 year now  Vaping Use   Vaping Use: Never used  Substance Use Topics   Alcohol use: Yes    Comment: occ   Drug use: No    Home Medications Prior to Admission medications   Medication Sig Start Date End Date Taking? Authorizing Provider  amoxicillin-clavulanate (AUGMENTIN) 875-125 MG tablet Take 1 tablet by mouth 2 (two) times daily. One po bid x 7 days 07/04/21   Drenda Freeze, MD  gabapentin (NEURONTIN) 300 MG capsule Take 300 mg by mouth 3 (three) times daily. 03/04/21   [provider]  gelatin adsorbable (GELFOAM/SURGIFOAM) 12-7 MM sponge Apply 1 each topically as needed (apply to bleeding area). 06/24/21   Antonieta Pert, MD  LORazepam (ATIVAN) 0.5 MG tablet Take 1 tablet (0.5 mg total) by mouth daily as needed for anxiety. 06/17/21   Arnaldo Natal, MD  methadone (DOLOPHINE) 5 MG tablet Take 1 tablet (5 mg total) by mouth every 8 (eight) hours. 06/17/21   Arnaldo Natal, MD  metoprolol tartrate (LOPRESSOR) 25 MG tablet Take 0.5 tablets (12.5 mg total) by mouth 2 (two) times daily. 06/24/21 08/23/21  Antonieta Pert, MD  metroNIDAZOLE (FLAGYL) 500 MG tablet Apply 1000 mg crushed on chest wall topical 06/24/21   Antonieta Pert, MD  naproxen sodium (ALEVE) 220 MG tablet Take 220 mg by mouth daily as needed (pain).    [provider]  ondansetron (ZOFRAN ODT) 4 MG disintegrating tablet Take 1 tablet (4 mg total) by mouth every 8 (eight) hours as needed for nausea or vomiting. 06/17/21   Arnaldo Natal, MD  oxyCODONE (OXY IR/ROXICODONE) 5 MG immediate release tablet Take 1 tablet (5 mg total) by mouth 2 (two) times daily as needed. Patient taking differently: Take 5 mg by mouth 2 (two) times daily as needed for severe pain. 06/17/21   Arnaldo Natal, MD  polyethylene glycol [facility] /  GLYCOLAX) 17 g packet Take 17 g by mouth daily as needed for up to 14 doses for moderate constipation. 06/24/21   Antonieta Pert, MD  Polyethylene Glycol 3350 (PEG 3350) 17 GM/SCOOP POWD Take 17 g by mouth daily as needed for constipation. 06/24/21   [provider]  senna-docusate (SENOKOT-S) 8.6-50 MG tablet Take 1 tablet by mouth 2 (two) times daily. 06/24/21 07/24/21  Antonieta Pert, MD    Allergies    Patient has no known allergies.  Review of Systems   Review of Systems  Constitutional:  Negative for appetite change and fatigue.  HENT:  Negative for congestion, ear discharge and sinus pressure.   Eyes:  Negative for discharge.  Respiratory:  Negative for cough.   Cardiovascular:  Negative for chest pain.  Gastrointestinal:  Negative for abdominal pain and diarrhea.  Genitourinary:  Negative for frequency and hematuria.  Musculoskeletal:  Negative for back pain.  Skin:  Negative for rash.  Neurological:  Positive for weakness. Negative for seizures and headaches.  Psychiatric/Behavioral:  Negative for hallucinations.    Physical Exam Updated Vital Signs BP 103/66   Pulse (!) 110   Resp 16   SpO2 100%   Physical Exam Vitals and nursing note reviewed.  Constitutional:      Appearance: She is well-developed.  HENT:     Head: Normocephalic.     Nose: Nose normal.  Eyes:     General: No scleral icterus.    Conjunctiva/sclera: Conjunctivae normal.  Neck:     Thyroid: No thyromegaly.  Cardiovascular:     Rate and  Rhythm: Normal rate and regular rhythm.     Heart sounds: No murmur heard.   No friction rub. No gallop.  Pulmonary:     Breath sounds: No stridor. No wheezing or rales.     Comments: Patient with a large open wound to the right chest and also a small to the left chest. Chest:     Chest wall: No tenderness.  Abdominal:     General: There is no distension.     Tenderness: There is no abdominal tenderness. There is no rebound.  Musculoskeletal:        General: Normal range of motion.     Cervical back: Neck supple.  Lymphadenopathy:     Cervical: No cervical adenopathy.  Skin:    Findings: No erythema or rash.  Neurological:     Mental Status: She is alert and oriented to person, place, and time.     Motor: No abnormal muscle tone.     Coordination: Coordination normal.  Psychiatric:        Behavior: Behavior normal.    ED Results / Procedures / Treatments   Labs (all labs ordered are listed, but only abnormal results are displayed) Labs Reviewed  CBC WITH DIFFERENTIAL/PLATELET - Abnormal; Notable for the following components:      Result Value   WBC 18.2 (*)    RBC 2.67 (*)    Hemoglobin 7.8 (*)    HCT 24.5 (*)    RDW 20.3 (*)    Platelets 35 (*)    Neutro Abs 17.1 (*)    Lymphs Abs 0.3 (*)    Abs Immature Granulocytes 0.24 (*)    All other components within normal limits  COMPREHENSIVE METABOLIC PANEL - Abnormal; Notable for the following components:   CO2 11 (*)    Glucose, Bld 62 (*)    BUN 29 (*)    Creatinine, Ser 1.67 (*)  Calcium 7.6 (*)    Total Protein 5.5 (*)    Albumin 1.7 (*)    AST 358 (*)    ALT 103 (*)    Alkaline Phosphatase 677 (*)    Total Bilirubin 3.3 (*)    GFR, Estimated 38 (*)    Anion gap 29 (*)    All other components within normal limits  RESP PANEL BY RT-PCR (FLU A&B, COVID) ARPGX2  TYPE AND SCREEN  PREPARE RBC (CROSSMATCH)    EKG None  Radiology DG Chest Port 1 View  Result Date: 07/05/2021 CLINICAL DATA:  Shortness of  breath. History of breast cancer per prior radiology records. EXAM: PORTABLE CHEST 1 VIEW COMPARISON:  CT chest 10/06/2020. Prior chest radiographs 08/14/2020 and earlier. FINDINGS: Left chest infusion port catheter with tip projecting at the level of the superior cavoatrial junction. Shallow inspiration radiograph. Heart size within normal limits. Fullness of the right hilum, which may reflect previously demonstrated right hilar lymphadenopathy. No appreciable airspace consolidation or pulmonary edema. No evidence of pleural effusion or pneumothorax. No acute bony abnormality identified. IMPRESSION: Shallow inspiration radiograph. No appreciable airspace consolidation or pulmonary edema. Fullness of the right hilum, which may reflect previously demonstrated right hilar lymphadenopathy. Electronically Signed   By: Kellie Simmering DO   On: 07/24/2021 13:01    Procedures Procedures   Medications Ordered in ED Medications  dextrose 50 % solution 25 mL (has no administration in time range)  vancomycin (VANCOCIN) IVPB 1000 mg/200 mL premix (has no administration in time range)  0.9 %  sodium chloride infusion (has no administration in time range)  sodium chloride 0.9 % bolus 500 mL (500 mLs Intravenous New Bag/Given 07/22/2021 1235)  HYDROmorphone (DILAUDID) injection 0.5 mg (0.5 mg Intravenous Given 07/30/2021 1305)  ondansetron (ZOFRAN) injection 4 mg (4 mg Intravenous Given 07/12/2021 1305)    ED Course  I have reviewed the triage vital signs and the nursing notes.  Pertinent labs & imaging results that were available during my care of the patient were reviewed by me and considered in my medical decision making (see chart for details).   CRITICAL CARE Performed by: Milton Ferguson Total critical care time: 45 minutes Critical care time was exclusive of separately billable procedures and treating other patients. Critical care was necessary to treat or prevent imminent or life-threatening deterioration. Critical  care was time spent personally by me on the following activities: development of treatment plan with patient and/or surrogate as well as nursing, discussions with consultants, evaluation of patient's response to treatment, examination of patient, obtaining history from patient or surrogate, ordering and performing treatments and interventions, ordering and review of laboratory studies, ordering and review of radiographic studies, pulse oximetry and re-evaluation of patient's condition. Still does not want to be a DNR and wants everything done.  I spoke with her oncologist Dr. [name] and he stated we could admit the patient and transfuse her and treat her with some antibiotics and fluids.  He will see the patient in consult this afternoon.  He will try to convince her that she should go to the [facility] hospice care MDM Rules/Calculators/A&P                           Metastatic cancer with wound infection and anemia.  Patient admitted to medicine with oncology consult Final Clinical Impression(s) / ED Diagnoses Final diagnoses:  Weakness    Rx / DC Orders ED Discharge Orders  None        Milton Ferguson, MD 07/18/2021 1454

## 2021-07-13 NOTE — ED Notes (Signed)
Blood bank has 2 units of blood ready for this patient. Informed [place],RN.

## 2021-07-13 NOTE — Progress Notes (Signed)
Patient arrived to ICU with blankets covering body and wounds. CCM consulted and at bedside at time of arrival. Patient moved to ICU and four ABD gauze gently placed over right open chest wound- noted to have yellow tissue surrounding eroded ribs.Two ABD placed on left bleeding wounds- notes multiple large open masses on breast and under arm. Also noted stage four wound in between left pinky and ring finger. Patient will not allow RN to attempt to measure wounds at this time.   Patient refusing to allow RN to take temperature at this time. RN informed patient that her temperature was low in ED. Patient attempting to remove warm blankets- RN attempted to educate.  RN attempted to complete orientation questions- patient oriented to self and location. Patient refusing to answer remaining questions at this time. Able to answer pain score and direct RN how to position her.  Blood infusing via left chest port. Patient refusing second IV site for ordered saline boluses and antibiotics ordered. MD notified.  RN attempted to give patient pain medication with stated pain goal of 8/10. Patient stated she did not want medication at this time. Patient told RN she takes oxycodone at home. This RN attempted to discussed pain management with patient and only shook her head stated "No, I dont want pain medication". MD notified.

## 2021-07-13 NOTE — Progress Notes (Signed)
[facility]) Hospital Liaison Note:   Ms. Dorise Hiss is a current hospice patient with [facility] .Patient is a Full Code and her [place] DX is Malignant neoplasm of upper-outer quadrant of right female breast.  Our triage was notified by [facility] of arrival - patient did not notify [facility] prior to initiating EMS.  An Hospital Liaison will continue to follow this patient during this hospitalization so please do not reach out to  [facility] with any questions. We are happy to assist with any needs.    Gar Ponto, RN  RN PRN/Hospital Liaison  [phone]

## 2021-07-13 NOTE — ED Notes (Signed)
Patient currently refusing vital signs, will attempt to collect VS at later time, RN aware.

## 2021-07-13 NOTE — ED Notes (Signed)
MD made aware of temp, awaiting further orders

## 2021-07-13 NOTE — Progress Notes (Signed)
Pharmacy Antibiotic Note  Rebecca [name] is a 45 y.o. female admitted on 07/26/2021 with  wound infection .  Pharmacy has been consulted for vancomycin and cefepime dosing.  Baseline Scr appears to be <1, current Scr is 1.67.  Will order a one-time dose of vancomycin to complete a loading dose.  Will hold off on ordering maintenance dosing of vancomycin to allow for improvement of renal function.   Plan: Received vancomycin '1000mg'$  IV x1 while in ED Give additional vancomycin '500mg'$  IV for a total loading dose of '1500mg'$  IV x1 Vancomycin variable dosing Cefepime 2g IV q12 hours Monitor renal function to adjust antibiotics as necessary F/u culture data, clinical improvements, vanc levels as needed    Temp (24hrs), Avg:94.9 F (34.9 C), Min:94.9 F (34.9 C), Max:94.9 F (34.9 C)  Recent Labs  Lab 07/18/2021 1218  WBC 18.2*  CREATININE 1.67*    Estimated Creatinine Clearance: 37.1 mL/min (A) (by C-G formula based on SCr of 1.67 mg/dL (H)).    No Known Allergies  Antimicrobials this admission: Vancomycin 8/9 >>  Cefepime 8/9 >>   Dose adjustments this admission: N/A  Microbiology results: None this admit  Of note, L hand abscess culture grew MRSA on 06/25/21  Thank you for allowing pharmacy to be a part of this patient's care.  Dimple Nanas, PharmD 07/06/2021 5:27 PM

## 2021-07-13 NOTE — Consult Note (Signed)
   NAME:  Rebecca [name], MRN:  [id number], DOB:  1976-08-15, LOS: 0 ADMISSION DATE:  07/25/2021, CONSULTATION DATE:  07/16/2021 REFERRING MD: Dr. Marylyn Ishihara, CHIEF COMPLAINT: Probable sepsis  History of Present Illness:  Patient was brought to the hospital with weakness and confusion  Patient with a history of breast cancer, recently with discussions regarding hospice care Metastatic breast cancer, multiple courses of chemotherapy, radiation treatment. Has not been eating, not drinking, increasingly confused Was living with her mom and her mom could no longer take care of her  Pertinent  Medical History   Past Medical History:  Diagnosis Date   Anxiety    Breast cancer ([place])    Depression    GERD (gastroesophageal reflux disease)    History of radiation therapy 11/15/16-01/12/17   right chest wall and axilla treated to 45 Gy in 25 fractions, boosted and additional 14 Gy in 8 fractions   Hypertension    diet controlled   Obesity (BMI 35.0-39.9 without comorbidity)    Pneumonia    as a child   Seasonal allergies    Sickle cell trait ([place])    Termination of pregnancy (fetus) 04/02/16   Significant Hospital Events: Including procedures, antibiotic start and stop dates in addition to other pertinent events   Extensive chest wound, necrotic tissue, metastatic chest wall deposits  Interim History / Subjective:  Patient was just writhing in pain  Objective   Blood pressure 95/63, pulse (!) 108, temperature (!) 94.9 F (34.9 C), temperature source Rectal, resp. rate (!) 23, SpO2 100 %.        Intake/Output Summary (Last 24 hours) at 07/22/2021 1950 Last data filed at 07/09/2021 1707 Gross per 24 hour  Intake 315 ml  Output --  Net 315 ml   There were no vitals filed for this visit.  Examination: General: Chronically ill-appearing HENT: Dry oral mucosa Lungs: Extensive right chest wall necrotic wound, multiple metastatic bleeding deposits over left breast , decreased air  movement posteriorly cardiovascular: S1-S2 appreciated Abdomen: Soft, bowel sounds appreciated Extremities: No clubbing, does have edema Neuro: Awake GU:   Resolved Hospital Problem list     Assessment & Plan:  Sepsis -Extensive chest wounds, elevated lactate -On antibiotics and fluids  Extensive chest wound is due to skin necrosis from metastatic cancer -Wound care will be consulted  History of breast cancer -Follows up with Dr. [name]  End-stage neoplastic process Comfort measures is most appropriate  I did discuss with patient's mother and patient status changed to DNR status  Continue current care  Sherrilyn Rist, MD [place] PCCM Pager: See Shea Evans

## 2021-07-13 NOTE — Progress Notes (Signed)
COURTESY NOTE:  45 [place] woman we follow for metastatic breast cancer. As detailed below, all treatment options have been exhausted;., patient sought a second opinion at [facility] and was sent back home with a recommendation for Hospice and a 2 week prognosis. She has been under care of Hospice while staying with her mother and has continued to decline so that it is no longer possible for her to stay at home. She is being admitted for possible terminal care.  If her condition improves she will need discharge to a Hospice inpatient facility [facility])  I visited the patient who was not verbal but seemed to nod when asked about pain. She certainly does not seem comfortable, frowning and moving slightly from side to side as if trying to get comfortable.  I have d/c'd the 2d unit of blood as transfusion is interfering with her other care. I would favor a strict comfort care approach (no labs, IVF or antibiotics, start morphine drip, only goal being comfort) but will defer to hospitalist who actually discussed situation with the patient's mother today and was able to obtain a DNR, something I support. It is important to make sure family knows Rebecca [name] is valued and important to [place] and sometimes the only way to convey that is to initially overtreat.  Greatly appreciate the care you are giving this patient and her family.  Will follow peripherally.  SUMMARY:  45 y.o. [place] woman status post right breast upper-outer quadrant biopsy 01/28/2016 for a clinical T2 N0 invasive ductal carcinoma, grade 3, triple negative, with an MIB-1 of 70%.             (a) suspicious right axillary lymph node biopsied 01/28/2016 was benign   (1) neoadjuvant chemotherapy: doxorubicin and cyclophosphamide in dose dense fashion 4 started 04/14/16, completed 05/26/2016, followed by paclitaxel and carboplatin weekly 12, Started 06/09/2016             (a) taxol discontinued after 7 doses because of neuropathy, last dose  07/21/2016   (2) genetics testing October 20, 2016 through the 32-gene Comprehensive Cancer Panel offered by [facility] Junius Roads, MD) (with MSH2 Exons 1-7 Inversion Analysis) found no deleterious mutations or VUSS  In APC, ATM, AXIN2, BARD1, BMPR1A, BRCA1, BRCA2, BRIP1, CDH1, CDK4, CDKN2A, CHEK2, EPCAM, FANCC, MLH1, MSH2, MSH6, MUTYH, NBN, PALB2, PMS2, POLD1, POLE, PTEN, RAD51C, RAD51D, SCG5/GREM1, SMAD4, STK11, TP53, VHL, and XRCC2.     (3) right mastectomy and sentinel lymph node sampling 09/06/2016 showed a residual ypT1c ypN0, invasive ductal carcinoma, grade 3, with negative margins. Repeat prognostic panel again triple negative   (4) adjuvant radiation with capecitabine/Xeloda sensitization 11/15/16 - 01/12/17  Site/dose:   Right Chest Wall and axilla (4 field) treated to 45 Gy in 25 fractions, and then Boosted an additional 14.4 Gy in 8 fractions.   (5) tobacco abuse disorder: The patient quit smoking 04/04/2018   METASTATIC DISEASE:  July 2020: chest wall, bones, nodes (6) nonspecific changes noted on chest CT 06/11/2019 were clarified by PET scan 06/19/2019 showing hypermetabolic disease in the right anterior chest wall, right internal mammary nodes, right and left axillary nodes, but no metastatic disease in the neck, lungs, abdomen or pelvis.  Bone marrow uptake suggests bony metastatic disease.             (a) CARIS requested obtained from 06/28/2019 sample confirmed a triple negativity, the tumor also was negative for the androgen receptor, was MSI stable and mismatch repair status proficient, with a low mutational  burden.  BRCA 1 and 2 were negative and PD-L1 was negative.  PIK3 showed a variant of uncertain significance.  However the tumor was genomic LOH high             (b) CA-27-29 is informative: was 118.3 on 06/04/2019   (7) zoledronate started 07/17/2019--on hold currently due to dental issues, and until 6 weeks at least after dental work   (8) carboplatin/  gemcitabine days 1 and 8 Q21 day cycle started 07/10/2019, last dose 10/30/2019             (a) restaging studies 09/2019 showed no progression             (b) restaging studies after 6 cycles showed mild disease progression   (9) cyclophosphamide, methotrexate, fluorouracil chemotherapy started 11/13/2019, repeated every 21 days.             (a) discontinued after cycle 3 (12/31/2019): No evidence of response   (10) started capecitabine 01/21/2020, given 1 week on and 1 week off at standard doses (1500 mg twice daily)             (a) Discontinued after almost three months of treatment (last on 04/06/2020)             (b) Disease progression on 4/27 CT scan showing increasing right chest wall disease, left breast lesions, and ? Liver involvement.             (c) MRI liver 04/14/2020 shows no liver invovlement   (11) Started liposomal doxorubicin/Doxil given on day 1 of a 21 day cycle on 04/07/2020             (a) echo on 04/02/2020 shows EF of 50-55%, repeat in 06/2020             (b) chest CT scan after 4 cycles of Doxil shows some evidence of progression in left axillary and right internal mammary adenopathy             (c) Doxil discontinued after 06/09/2020 dose   (12) started sacituzumab govitecan/ Trodelvy 07/10/2020             (a) day 8 cycle 2 omitted due to febrile neutropenia requiring admission             (b) changed to every two week dosing with Udenyca support after cycle 2             (c) discontinued after  09/24/2020 dose with evidence of disease progression   (13) started navelbine 10/15/2020, to be repeated days 1 and 8 of each 21-day cycle             (a) chest CT scan 10/06/2020 is new baseline study for measurable disease             (b) discontinued after one cycle at patient's request (not tolerated)   (14) started eribulin 11/12/2020, given days 1 and 8 of each 21-day cycle             (a) day 8 cycle 2 omitted because of intercurrent illness in her family  (b)  discontnued after 02/04/2021 dose with no evidence of response  (15) sought second opinion at [facility]; was briefly treated and sent home with a recommendatiion for Hospice *(their prognosis was < 2 weeks)  (a) under Hospice care at home

## 2021-07-13 NOTE — ED Notes (Signed)
Dressings removed per patient request. Large right sided chest defect down to the ribs, yellowish tissue noted, no active bleeding. Multiple tumors to the left side of chest, Xeroform removed, bleeding noted from the edge of the wounds.

## 2021-07-13 NOTE — ED Triage Notes (Signed)
Per EMS-history of cancer, came to ED for pain control-open wounds on right flank/torso

## 2021-07-14 ENCOUNTER — Other Ambulatory Visit: Payer: Self-pay | Admitting: *Deleted

## 2021-07-14 DIAGNOSIS — C50919 Malignant neoplasm of unspecified site of unspecified female breast: Secondary | ICD-10-CM | POA: Diagnosis not present

## 2021-07-14 DIAGNOSIS — Z515 Encounter for palliative care: Secondary | ICD-10-CM

## 2021-07-14 DIAGNOSIS — Z7189 Other specified counseling: Secondary | ICD-10-CM

## 2021-07-14 DIAGNOSIS — G893 Neoplasm related pain (acute) (chronic): Secondary | ICD-10-CM

## 2021-07-14 DIAGNOSIS — R6520 Severe sepsis without septic shock: Secondary | ICD-10-CM | POA: Diagnosis not present

## 2021-07-14 DIAGNOSIS — N179 Acute kidney failure, unspecified: Secondary | ICD-10-CM | POA: Diagnosis not present

## 2021-07-14 DIAGNOSIS — A419 Sepsis, unspecified organism: Secondary | ICD-10-CM | POA: Diagnosis not present

## 2021-07-14 LAB — CBC
HCT: 24.4 % — ABNORMAL LOW (ref 36.0–46.0)
HCT: 24.9 % — ABNORMAL LOW (ref 36.0–46.0)
Hemoglobin: 7.8 g/dL — ABNORMAL LOW (ref 12.0–15.0)
Hemoglobin: 7.9 g/dL — ABNORMAL LOW (ref 12.0–15.0)
MCH: 28.3 pg (ref 26.0–34.0)
MCH: 28.8 pg (ref 26.0–34.0)
MCHC: 31.7 g/dL (ref 30.0–36.0)
MCHC: 32 g/dL (ref 30.0–36.0)
MCV: 89.2 fL (ref 80.0–100.0)
MCV: 90 fL (ref 80.0–100.0)
Platelets: 24 10*3/uL — CL (ref 150–400)
Platelets: 25 10*3/uL — CL (ref 150–400)
RBC: 2.71 MIL/uL — ABNORMAL LOW (ref 3.87–5.11)
RBC: 2.79 MIL/uL — ABNORMAL LOW (ref 3.87–5.11)
RDW: 18.2 % — ABNORMAL HIGH (ref 11.5–15.5)
RDW: 18.2 % — ABNORMAL HIGH (ref 11.5–15.5)
WBC: 20.6 10*3/uL — ABNORMAL HIGH (ref 4.0–10.5)
WBC: 22 10*3/uL — ABNORMAL HIGH (ref 4.0–10.5)
nRBC: 0.3 % — ABNORMAL HIGH (ref 0.0–0.2)
nRBC: 0.3 % — ABNORMAL HIGH (ref 0.0–0.2)

## 2021-07-14 LAB — COMPREHENSIVE METABOLIC PANEL
ALT: 101 U/L — ABNORMAL HIGH (ref 0–44)
AST: 553 U/L — ABNORMAL HIGH (ref 15–41)
Albumin: 1.4 g/dL — ABNORMAL LOW (ref 3.5–5.0)
Alkaline Phosphatase: 526 U/L — ABNORMAL HIGH (ref 38–126)
Anion gap: 28 — ABNORMAL HIGH (ref 5–15)
BUN: 33 mg/dL — ABNORMAL HIGH (ref 6–20)
CO2: 9 mmol/L — ABNORMAL LOW (ref 22–32)
Calcium: 6.7 mg/dL — ABNORMAL LOW (ref 8.9–10.3)
Chloride: 103 mmol/L (ref 98–111)
Creatinine, Ser: 1.75 mg/dL — ABNORMAL HIGH (ref 0.44–1.00)
GFR, Estimated: 36 mL/min — ABNORMAL LOW (ref >60)
Glucose, Bld: 157 mg/dL — ABNORMAL HIGH (ref 70–99)
Potassium: 4.8 mmol/L (ref 3.5–5.1)
Sodium: 140 mmol/L (ref 135–145)
Total Bilirubin: 3.9 mg/dL — ABNORMAL HIGH (ref 0.3–1.2)
Total Protein: 4.5 g/dL — ABNORMAL LOW (ref 6.5–8.1)

## 2021-07-14 LAB — CORTISOL-AM, BLOOD: Cortisol - AM: >100 ug/dL — ABNORMAL HIGH (ref 6.7–22.6)

## 2021-07-14 LAB — PROTIME-INR
INR: 6.6 (ref 0.8–1.2)
Prothrombin Time: 57.3 seconds — ABNORMAL HIGH (ref 11.4–15.2)

## 2021-07-14 LAB — LACTIC ACID, PLASMA: Lactic Acid, Venous: >11 mmol/L (ref 0.5–1.9)

## 2021-07-14 LAB — GLUCOSE, RANDOM
Glucose, Bld: 111 mg/dL — ABNORMAL HIGH (ref 70–99)
Glucose, Bld: 140 mg/dL — ABNORMAL HIGH (ref 70–99)
Glucose, Bld: 214 mg/dL — ABNORMAL HIGH (ref 70–99)

## 2021-07-14 LAB — PROCALCITONIN: Procalcitonin: 4.96 ng/mL

## 2021-07-14 MED ORDER — SODIUM CHLORIDE 0.9 % IV BOLUS
1000.0000 mL | Freq: Once | INTRAVENOUS | Status: AC
  Administered 2021-07-14: 1000 mL via INTRAVENOUS

## 2021-07-14 MED ORDER — HYDROMORPHONE BOLUS VIA INFUSION
2.0000 mg | INTRAVENOUS | Status: DC | PRN
  Filled 2021-07-14: qty 2

## 2021-07-14 MED ORDER — CHLORHEXIDINE GLUCONATE CLOTH 2 % EX PADS
6.0000 | MEDICATED_PAD | Freq: Every day | CUTANEOUS | Status: DC
  Administered 2021-07-14: 6 via TOPICAL

## 2021-07-14 MED ORDER — VITAMIN K1 10 MG/ML IJ SOLN
5.0000 mg | Freq: Once | INTRAVENOUS | Status: AC
  Administered 2021-07-14: 5 mg via INTRAVENOUS
  Filled 2021-07-14: qty 0.5

## 2021-07-14 MED ORDER — ACETAMINOPHEN 650 MG RE SUPP: 650.0000 mg | Freq: Four times a day (QID) | RECTAL | Status: DC | PRN

## 2021-07-14 MED ORDER — LORAZEPAM 2 MG/ML PO CONC: 1.0000 mg | ORAL | Status: DC | PRN

## 2021-07-14 MED ORDER — LORAZEPAM 1 MG PO TABS: 1.0000 mg | ORAL_TABLET | ORAL | Status: DC | PRN

## 2021-07-14 MED ORDER — BIOTENE DRY MOUTH MT LIQD: 15.0000 mL | OROMUCOSAL | Status: DC | PRN

## 2021-07-14 MED ORDER — HYDROMORPHONE HCL 1 MG/ML IJ SOLN
1.0000 mg | INTRAMUSCULAR | Status: DC | PRN
Start: 2021-07-14 — End: 2021-07-14
  Administered 2021-07-14: 2 mg via INTRAVENOUS
  Administered 2021-07-14: 1 mg via INTRAVENOUS
  Administered 2021-07-14 (×2): 2 mg via INTRAVENOUS
  Administered 2021-07-14: 1 mg via INTRAVENOUS
  Filled 2021-07-14: qty 2
  Filled 2021-07-14: qty 1
  Filled 2021-07-14 (×2): qty 2
  Filled 2021-07-14: qty 1

## 2021-07-14 MED ORDER — GLYCOPYRROLATE 1 MG PO TABS: 1.0000 mg | ORAL_TABLET | ORAL | Status: DC | PRN

## 2021-07-14 MED ORDER — LORAZEPAM 2 MG/ML IJ SOLN: 1.0000 mg | INTRAMUSCULAR | Status: DC | PRN

## 2021-07-14 MED ORDER — MORPHINE SULFATE (PF) 4 MG/ML IV SOLN
4.0000 mg | INTRAVENOUS | Status: DC | PRN
  Administered 2021-07-14: 4 mg via INTRAVENOUS
  Filled 2021-07-14: qty 1

## 2021-07-14 MED ORDER — SODIUM CHLORIDE 0.9 % IV SOLN
1.5000 mg/h | INTRAVENOUS | Status: DC
  Administered 2021-07-14: 1 mg/h via INTRAVENOUS
  Filled 2021-07-14: qty 5

## 2021-07-14 MED ORDER — ACETAMINOPHEN 325 MG PO TABS: 650.0000 mg | ORAL_TABLET | Freq: Four times a day (QID) | ORAL | Status: DC | PRN

## 2021-07-14 MED ORDER — LORAZEPAM 2 MG/ML IJ SOLN: 0.5000 mg | Freq: Four times a day (QID) | INTRAMUSCULAR | Status: DC

## 2021-07-14 MED ORDER — HYDROMORPHONE BOLUS VIA INFUSION
1.0000 mg | INTRAVENOUS | Status: DC | PRN
  Filled 2021-07-14: qty 1

## 2021-07-14 MED ORDER — GLYCOPYRROLATE 0.2 MG/ML IJ SOLN: 0.2000 mg | INTRAMUSCULAR | Status: DC | PRN

## 2021-07-14 MED ORDER — SODIUM CHLORIDE 0.9% IV SOLUTION: Freq: Once | INTRAVENOUS | Status: DC

## 2021-07-14 MED ORDER — HALOPERIDOL 1 MG PO TABS: 0.5000 mg | ORAL_TABLET | ORAL | Status: DC | PRN

## 2021-07-14 MED ORDER — HALOPERIDOL LACTATE 5 MG/ML IJ SOLN: 0.5000 mg | INTRAMUSCULAR | Status: DC | PRN

## 2021-07-14 MED ORDER — POLYVINYL ALCOHOL 1.4 % OP SOLN
1.0000 [drp] | Freq: Four times a day (QID) | OPHTHALMIC | Status: DC | PRN
  Filled 2021-07-14: qty 15

## 2021-07-14 MED ORDER — HYDROMORPHONE HCL 1 MG/ML IJ SOLN
0.5000 mg | INTRAMUSCULAR | Status: DC | PRN
  Administered 2021-07-14 (×2): 0.5 mg via INTRAVENOUS
  Administered 2021-07-14: 1 mg via INTRAVENOUS
  Filled 2021-07-14 (×2): qty 1

## 2021-07-14 MED ORDER — HALOPERIDOL LACTATE 2 MG/ML PO CONC
0.5000 mg | ORAL | Status: DC | PRN
  Filled 2021-07-14: qty 0.3

## 2021-07-15 LAB — PREPARE FRESH FROZEN PLASMA: Unit division: 0

## 2021-07-15 LAB — BPAM FFP
Blood Product Expiration Date: 202208152359
Blood Product Expiration Date: 202208152359
Blood Product Expiration Date: 202208152359
Unit Type and Rh: 5100
Unit Type and Rh: 5100
Unit Type and Rh: 5100

## 2021-07-16 LAB — TYPE AND SCREEN
ABO/RH(D): O POS
Antibody Screen: NEGATIVE
Unit division: 0
Unit division: 0

## 2021-07-16 LAB — BPAM RBC
Blood Product Expiration Date: 202209112359
Blood Product Expiration Date: 202209122359
ISSUE DATE / TIME: 202208091621
Unit Type and Rh: 5100
Unit Type and Rh: 5100

## 2021-07-19 LAB — CULTURE, BLOOD (ROUTINE X 2)
Culture: NO GROWTH
Culture: NO GROWTH
Special Requests: ADEQUATE
Special Requests: ADEQUATE

## 2021-08-05 NOTE — Progress Notes (Signed)
Critical lab values: PT 57.3 INR 6.6  E[facility] notified

## 2021-08-05 NOTE — Progress Notes (Signed)
Critical Lab Value:  Platelet 24 Lactic Acid >11  Elink notified.

## 2021-08-05 NOTE — Progress Notes (Signed)
Chaplain engaged in follow-up visit with Rebecca [name]'s family.  Chaplain was able to meet her mom, cousins, and daughter.  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  She battled with cancer the last five years.  Chaplain offered support and prayer to them.    Chaplain is available to follow-up.    August 02, 2021 1800  Clinical Encounter Type  Visited With Family  Visit Type Follow-up;Spiritual support;Death  Spiritual Encounters  Spiritual Needs Grief support;Prayer

## 2021-08-05 NOTE — Progress Notes (Signed)
90 mL of Dilaudid wasted with Ludger Nutting. RN as witness.

## 2021-08-05 NOTE — Progress Notes (Signed)
[facility] Progress Note Patient Name: Rebecca [name] DOB: 04/17/1976 MRN: [id number]   Date of Service  Aug 09, 2021  HPI/Events of Note  Pain  eICU Interventions  Plan: Increase Morphine to 4 mg IV Q 3 hours PRN pain.     Intervention Category Major Interventions: Other:  Ori Trejos [name] 08-09-2021, 12:12 AM

## 2021-08-05 NOTE — Death Summary Note (Signed)
DEATH SUMMARY   Patient Details  Name: Rebecca [name] MRN: [id number] DOB: May 03, 1976  Admission/Discharge Information   Admit Date:  08/06/21  Date of Death: Date of Death: August 07, 2021  Time of Death: Time of Death: 03-16-00  Length of Stay: 1  Referring Physician: Dorena Dew, FNP   Reason(s) for Hospitalization   Diagnoses  Preliminary cause of death:   Metastatic breast cancer End-of-life care:on comfort measure Severe sepsis due to infected chest wound Extensive chest wall wound due to skin necrosis from metastatic cancer AKI Lactic acidosis Leukocytosis  Thrombocytopenia  Anemia  Transaminitis Normocytic anemia with chronic bleeding from the chest wall cancer Failure to thrive    Brief Hospital Course (including significant findings, care, treatment, and services provided and events leading to death)  Rebecca [name] is a 45 y.o. year old female with medical history significant for metastatic breast cancer with mets to bone and lymph node chest wall followed by Dr. [name] has had surgery, chemotherapy and radiotherapy multiple courses recently discharged from cancer center for medical after she had exhausted all chemotherapeutic options recently admitted for symptomatic anemia bleeding from the breast cancer and discharged to home with hospice with hx of  HTN, tachycardia, depression brought to the ED with multiple complaints, as per her mother's report, the patient has stopped eating. She still will drink fluids, but refused to eat at this point. She had been in more pain over the last several days. During that same time she has become more confused. Her condition has deteriorated to such that she is unable to be cared for at home. Her mother had her brought to the ED for assistance.    ED Course: Lab work showed leukocytosis 21k, AKI with creatinine 1.7, hypoglycemia, anemia, thrombocytopenia 24k, transaminitis. She was started on antibiotics  occlusively was consulted and patient was admitted for severe sepsis due to infected chest wound, AKI, leukocytosis metabolic acidosis pancytopenia anemia hypoglycemia hypothermia. Patient was admitted to stepdown and seen by critical care.  Patient was felt to have end-stage neoplastic process.  Palliative care was consulted. After discussion with patient's family patient was transitioned to comfort measures 8/10 and planning on inpatient hospice. Patient peacefully passed away with family at the bedside before she was discharged to inpatient hospice     Pertinent Labs and Studies  Significant Diagnostic Studies DG Chest Port 1 View  Result Date: 2021-08-06 CLINICAL DATA:  Shortness of breath. History of breast cancer per prior radiology records. EXAM: PORTABLE CHEST 1 VIEW COMPARISON:  CT chest 10/06/2020. Prior chest radiographs 08/14/2020 and earlier. FINDINGS: Left chest infusion port catheter with tip projecting at the level of the superior cavoatrial junction. Shallow inspiration radiograph. Heart size within normal limits. Fullness of the right hilum, which may reflect previously demonstrated right hilar lymphadenopathy. No appreciable airspace consolidation or pulmonary edema. No evidence of pleural effusion or pneumothorax. No acute bony abnormality identified. IMPRESSION: Shallow inspiration radiograph. No appreciable airspace consolidation or pulmonary edema. Fullness of the right hilum, which may reflect previously demonstrated right hilar lymphadenopathy. Electronically Signed   By: Kellie Simmering DO   On: 2021/08/06 13:01    Microbiology Recent Results (from the past 240 hour(s))  Resp Panel by RT-PCR (Flu A&B, Covid) Nasopharyngeal Swab     Status: None   Collection Time: 08/06/2021  3:20 PM   Specimen: Nasopharyngeal Swab; Nasopharyngeal(NP) swabs in vial transport medium  Result Value Ref Range Status   SARS Coronavirus 2 by RT PCR NEGATIVE NEGATIVE Final  Comment:  (NOTE) SARS-CoV-2 target nucleic acids are NOT DETECTED.  The SARS-CoV-2 RNA is generally detectable in upper respiratory specimens during the acute phase of infection. The lowest concentration of SARS-CoV-2 viral copies this assay can detect is 138 copies/mL. A negative result does not preclude SARS-Cov-2 infection and should not be used as the sole basis for treatment or other patient management decisions. A negative result may occur with  improper specimen collection/handling, submission of specimen other than nasopharyngeal swab, presence of viral mutation(s) within the areas targeted by this assay, and inadequate number of viral copies(<138 copies/mL). A negative result must be combined with clinical observations, patient history, and epidemiological information. The expected result is Negative.  Fact Sheet for Patients:  EntrepreneurPulse.com.au  Fact Sheet for Healthcare Providers:  IncredibleEmployment.be  This test is no t yet approved or cleared by the [place] FDA and  has been authorized for detection and/or diagnosis of SARS-CoV-2 by FDA under an Emergency Use Authorization (EUA). This EUA will remain  in effect (meaning this test can be used) for the duration of the COVID-19 declaration under Section 564(b)(1) of the Act, 21 U.S.C.section 360bbb-3(b)(1), unless the authorization is terminated  or revoked sooner.       Influenza A by PCR NEGATIVE NEGATIVE Final   Influenza B by PCR NEGATIVE NEGATIVE Final    Comment: (NOTE) The Xpert Xpress SARS-CoV-2/FLU/RSV plus assay is intended as an aid in the diagnosis of influenza from Nasopharyngeal swab specimens and should not be used as a sole basis for treatment. Nasal washings and aspirates are unacceptable for Xpert Xpress SARS-CoV-2/FLU/RSV testing.  Fact Sheet for Patients: EntrepreneurPulse.com.au  Fact Sheet for Healthcare  Providers: IncredibleEmployment.be  This test is not yet approved or cleared by the [place] FDA and has been authorized for detection and/or diagnosis of SARS-CoV-2 by FDA under an Emergency Use Authorization (EUA). This EUA will remain in effect (meaning this test can be used) for the duration of the COVID-19 declaration under Section 564(b)(1) of the Act, 21 U.S.C. section 360bbb-3(b)(1), unless the authorization is terminated or revoked.  Performed at [facility], [place] [address] [place], [place] [address]   MRSA Next Gen by PCR, Nasal     Status: None   Collection Time: 07/11/2021  6:12 PM   Specimen: Nasal Mucosa; Nasal Swab  Result Value Ref Range Status   MRSA by PCR Next Gen NOT DETECTED NOT DETECTED Final    Comment: (NOTE) The GeneXpert MRSA Assay (FDA approved for NASAL specimens only), is one component of a comprehensive MRSA colonization surveillance program. It is not intended to diagnose MRSA infection nor to guide or monitor treatment for MRSA infections. Test performance is not FDA approved in patients less than 99 years old. Performed at [facility], [place] [address] [place], [place] [address]   Culture, blood (routine x 2)     Status: None (Preliminary result)   Collection Time: 21-Jul-2021 10:18 AM   Specimen: BLOOD  Result Value Ref Range Status   Specimen Description   Final    BLOOD RIGHT ANTECUBITAL Performed at [facility] [address] [place], [place] [address]    Special Requests   Final    BOTTLES DRAWN AEROBIC AND ANAEROBIC Blood Culture adequate volume Performed at [facility] [address] [place], [place] [address]    Culture   Final    NO GROWTH < 24 HOURS Performed at [name]  [address] [place], [place] [address]    Report Status PENDING  Incomplete  Culture, blood (routine x 2)     Status: None (Preliminary result)    Collection Time: 23-Jul-2021 10:18 AM   Specimen: BLOOD RIGHT HAND  Result Value Ref Range Status   Specimen Description   Final    BLOOD RIGHT HAND Performed at [facility] [address] [place], [place] [address]    Special Requests   Final    BOTTLES DRAWN AEROBIC AND ANAEROBIC Blood Culture adequate volume Performed at [facility] [address] [place], [place] [address]    Culture   Final    NO GROWTH < 24 HOURS Performed at [facility] [address] [place],  [address]    Report Status PENDING  Incomplete    Lab Basic Metabolic Panel: Recent Labs  Lab 07/29/2021 1218 07/05/2021 2358 07-23-2021 0211 07-23-2021 0406 July 23, 2021 1018  NA 141  --   --  140  --   K 4.6  --   --  4.8  --   CL 101  --   --  103  --   CO2 11*  --   --  9*  --   GLUCOSE 62* 111* 140* 157* 214*  BUN 29*  --   --  33*  --   CREATININE 1.67*  --   --  1.75*  --   CALCIUM 7.6*  --   --  6.7*  --    Liver Function Tests: Recent Labs  Lab 07/09/2021 1218 2021/07/23 0406  AST 358* 553*  ALT 103* 101*  ALKPHOS 677* 526*  BILITOT 3.3* 3.9*  PROT 5.5* 4.5*  ALBUMIN 1.7* 1.4*   No results for input(s): LIPASE, AMYLASE in the last 168 hours. No results for input(s): AMMONIA in the last 168 hours. CBC: Recent Labs  Lab 07/25/2021 1218 07/24/2021 2358 07/23/2021 0406  WBC 18.2* 20.6* 22.0*  NEUTROABS 17.1*  --   --   HGB 7.8* 7.8* 7.9*  HCT 24.5* 24.4* 24.9*  MCV 91.8 90.0 89.2  PLT 35* 24* 25*   Cardiac Enzymes: No results for input(s): CKTOTAL, CKMB, CKMBINDEX, TROPONINI in the last 168 hours. Sepsis Labs: Recent Labs  Lab 07/07/2021 1218 07/17/2021 2358 07/23/21 0406  PROCALCITON  --   --  4.96  WBC 18.2* 20.6* 22.0*  LATICACIDVEN  --  >11.0*  --     Procedures/Operations    Antonieta Pert 07/15/2021, 3:17 PM

## 2021-08-05 NOTE — Progress Notes (Signed)
eLink Physician-Brief Progress Note Patient Name: Rebecca [name] DOB: 16-Aug-1976 MRN: [id number]   Date of Service  05-Aug-2021  HPI/Events of Note  Multiple issues: 1. Thrombocytopenia - Platelets = 24K. Transfuse for active bleeding or platelet count < 10K, 2. Lactic Acid > 11.0 and 3. Pain - Morphine not effective controlling pain.   eICU Interventions  Plan: Bolus with 0.9 NaCl 1 liter IV over 1 hour now. D/C Morphine. Dilaudid 0.5-1 mg IV Q 2 hours PRN moderate to severe pain.      Intervention Category Major Interventions: Acid-Base disturbance - evaluation and management;Other:  [name] [name] 08-05-21, 1:37 AM

## 2021-08-05 NOTE — Progress Notes (Signed)
Followed up with family after Genesis saw them earlier in the day.  They are sitting with her but reported no needs at this time.  [place], [place] Pager, [phone] 4:41 PM

## 2021-08-05 NOTE — Progress Notes (Signed)
[facility] Progress Note Patient Name: Rebecca [name] DOB: May 24, 1976 MRN: [id number]   Date of Service  07/25/2021  HPI/Events of Note  Coagulopathy - PT = 57.3 and INR = 6.6.  eICU Interventions  Plan: Vitamin K 5 mg iV now. Transfuse 4 units FFP now. Repeat PT/INR at 1 PM.     Intervention Category Major Interventions: Other:  Akosua Constantine [name] Jul 25, 2021, 5:20 AM

## 2021-08-05 NOTE — Progress Notes (Signed)
[facility] Physician-Brief Progress Note Patient Name: Rebecca [name] DOB: 04-22-1976 MRN: [id number]   Date of Service  August 01, 2021  HPI/Events of Note  Pain - Patient states that Dilaudid is not working long enough.   eICU Interventions  Plan" Increase Dilaudid to 1-2 mg IV Q 2 hours PRN moderate or severe pain.      Intervention Category Major Interventions: Other:  Chavy Avera [name] 2021/08/01, 5:25 AM

## 2021-08-05 NOTE — Consult Note (Signed)
[facility] Nurse wound consult note Consultation was completed by review of records, images and assistance from the clinical staff Patient familiar to [facility] nurse from previous admission.  Admitted for pain management and comfort care. Reviewed oncology notes. Reason for Consult: breast wounds Wound type: metastatic breast cancer  Pressure Injury POA: NA Measurement: patient refusing  Wound bed: friable; yellow Drainage (amount, consistency, odor) moderate; serosanguinous  Periwound: intact  Dressing procedure/placement/frequency:  Dressing changes can be dictated per patient. She has been caring for at home for some time  Xeroform and ABD pads, will not restart crushed Flagyl bc she is more comfort care and we were using at home for odor control.   Change PRN patient request and for drainage strike through.    Re consult if needed, will not follow at this time. Thanks  Julianny Milstein [employer], RN,CWOCN, CNS, [place] [phone])

## 2021-08-05 NOTE — Progress Notes (Signed)
Chaplain engaged in an initial visit with [name] and her brother, [name].   [name] shared that Rebecca [name] is just a good person with a kind heart and spirit.  He affectionately called her his "baby" since she is the youngest of the three children.  He also described her as being feisty because she is the only girl with two older brothers.  Kevonna is the mother of four children, with two being under 47 years of age.  [name] shared that they had been working on getting [name]'s children to come to the hospital to see her.  Chaplain was able to get permission to have her children come visit her.    Chaplain offered support, presence, and listening.    07-23-2021 1400  Clinical Encounter Type  Visited With Patient and family together  Visit Type Initial;Social support;Spiritual support

## 2021-08-05 NOTE — TOC Progression Note (Signed)
Transition of Care [facility]) - Progression Note    Patient Details  Name: Rebecca [name] MRN: [id number] Date of Birth: [birth year]  Transition of [facility]) CM/SW Contact  Leeroy Cha, RN Phone Number: Jul 30, 2021, 1:51 PM  Clinical Narrative:    Information for referral sent to [place] with [facility] for residential hospice.   Expected Discharge Plan: [place]    Expected Discharge Plan and Services Expected Discharge Plan: [place]         Expected Discharge Date: 07-30-21                                     Social Determinants of Health (SDOH) Interventions    Readmission Risk Interventions No flowsheet data found.

## 2021-08-05 NOTE — Consult Note (Signed)
Consultation Note Date: 07/15/2021   Patient Name: Rebecca [name]  DOB: 1976/08/03  MRN: [id number]  Age / Sex: 45 y.o., female  PCP: Dorena Dew, FNP Referring Physician: No att. providers found  Reason for Consultation: Establishing goals of care, Non pain symptom management, Pain control, and Terminal Care  HPI/Patient Profile: 46 y.o. female  with past medical history of breast cancer who is home with hospice and was admitted on 07/11/2021 with uncontrolled symptoms.  She is in ICU and is currently being treated for anemia and septic shock.  Palliative consulted for goals of care.  Clinical Assessment and Goals of Care: I saw and examined Rebecca [name] today.  She is well-known to me from prior admissions.  She was lying in bed with notable grunting at time of my encounter.  Her brother, Rebecca [name], was at the bedside.  I discussed with him concern that she appears to be actively dying.  Rebecca [name] tells me that family is aware of this fact and that their hope is that she will be comfortable moving forward.  Their other concern is getting permission for family, particularly her daughters, to come and see her if they want to do so.  I then called and was able to reach patient's mother, Rebecca [name].  She serves as Scientist, water quality.  We discussed clinical course as well as wishes moving forward in regard to [name]'s care plan.  We discussed difference between a aggressive medical intervention path and a palliative, comfort focused care path.  Values and goals of care important to patient and family were attempted to be elicited.  Rebecca [name] tells me that family understands that she is dying.  The primary goal moving forward is to make sure that she is as comfortable as possible as she approaches end-of-life.  We discussed plan to transition to full comfort care.  We will consider discharge to residential  hospice based upon her stability for transport and her clinical course overnight, assuming she survives the night.  I was clear with her mother and family that I felt that her prognosis at this point is likely hours to potentially a very limited number of days.  Questions and concerns addressed.   PMT will continue to support holistically.   SUMMARY OF RECOMMENDATIONS   -DNR/DNI -Full comfort moving forward -She is restless on my examination.  We will begin more aggressive symptom regimen:  -Pain/shortness of breath: Initiate Dilaudid infusion.  Continue as needed Dilaudid as needed.  Low threshold to uptitrate as needed to maintain her comfort.  She has been on methadone at home and therefore she would likely need to increase dose of opioids as this works his way out of her system over the next day or so.  -Anxiety: Ativan as needed  -Agitation: Haldol as needed  -Secretions: Robinul as needed -Anticipate this will be a hospital death.  If she stabilizes, could consider transition to residential hospice at [facility].  Code Status/Advance Care Planning: DNR  Palliative Prophylaxis:  Frequent Pain Assessment  Additional  Recommendations (Limitations, Scope, Preferences): Full Comfort Care  Psycho-social/Spiritual:  Desire for further Chaplaincy support:yes Additional Recommendations: Grief/Bereavement Support  Prognosis:  Hours - Days  Discharge Planning: Anticipated Hospital Death      Primary Diagnoses: Present on Admission:  Sepsis ([place])   I have reviewed the medical record, interviewed the patient and family, and examined the patient. The following aspects are pertinent.  Past Medical History:  Diagnosis Date   Anxiety    Breast cancer ([place])    Depression    GERD (gastroesophageal reflux disease)    History of radiation therapy 11/15/16-01/12/17   right chest wall and axilla treated to 45 Gy in 25 fractions, boosted and additional 14 Gy in 8 fractions    Hypertension    diet controlled   Obesity (BMI 35.0-39.9 without comorbidity)    Pneumonia    as a child   Seasonal allergies    Sickle cell trait (HCC)    Termination of pregnancy (fetus) 04/02/16   Social History   Socioeconomic History   Marital status: Single    Spouse name: Not on file   Number of children: 4   Years of education: Not on file   Highest education level: Not on file  Occupational History   Occupation: Private Care Attendant    Comment: [employer]  Tobacco Use   Smoking status: Former    Packs/day: 1.00    Years: 20.00    Pack years: 20.00    Types: Cigarettes    Quit date: 04/01/2018    Years since quitting: 3.2   Smokeless tobacco: Never   Tobacco comments:    Patient has quit smoking x 1 year now  Vaping Use   Vaping Use: Never used  Substance and Sexual Activity   Alcohol use: Yes    Comment: occ   Drug use: No   Sexual activity: Not on file  Other Topics Concern   Not on file  Social History Narrative   Not on file   Social Determinants of Health   Financial Resource Strain: Not on file  Food Insecurity: Not on file  Transportation Needs: Not on file  Physical Activity: Not on file  Stress: Not on file  Social Connections: Not on file   Family History  Problem Relation Age of Onset   Hypertension Mother    Cancer Mother        dx "intestinal cancer" in her 9s; +surgery   Other Mother        hysterectomy at young age for unspecified cause   Heart Problems Mother    Breast cancer Cousin        maternal 1st cousin dx female breast cancer at 69-46y   Cancer Father    Hypertension Father    Heart Problems Maternal Aunt    Diabetes Maternal Aunt    Breast cancer Maternal Uncle        dx 64-65   Heart Problems Maternal Uncle    Breast cancer Maternal Grandmother 50   Throat cancer Maternal Grandfather        d. 75s; smoker   Sickle cell anemia Paternal Aunt    Congestive Heart Failure Maternal Aunt    Multiple  sclerosis Cousin    Cancer Other        maternal great uncle (MGM's brother); cancer removed from his side   Heart attack Paternal Aunt        d. early 24s   Scheduled Meds: Continuous Infusions: PRN  Meds:. Medications Prior to Admission:  Prior to Admission medications   Medication Sig Start Date End Date Taking? Authorizing Provider  LORazepam (ATIVAN) 0.5 MG tablet Take 1 tablet (0.5 mg total) by mouth daily as needed for anxiety. 06/17/21  Yes Arnaldo Natal, MD   No Known Allergies Review of Systems Unable to obtain  Physical Exam General: Somnolent but grunting  Heart: Tachycardic. No murmur appreciated. Lungs: Decreased air movement Abdomen: Soft, nontender, nondistended, positive bowel sounds.   Ext: No significant edema Skin: Warm and dry  Vital Signs: BP 112/63   Pulse (!) 105   Temp (!) 97.4 F (36.3 C) (Axillary)   Resp (!) 0   Ht '5\' 1"'$  (1.549 m)   Wt 69.5 kg   SpO2 100%   BMI 28.95 kg/m  Pain Scale: CPOT   Pain Score: 6    SpO2: SpO2: 100 % O2 Device:SpO2: 100 % O2 Flow Rate: .O2 Flow Rate (L/min): 2 L/min  IO: Intake/output summary:  Intake/Output Summary (Last 24 hours) at 07/15/2021 1053 Last data filed at 07/22/2021 1615 Gross per 24 hour  Intake 8.72 ml  Output --  Net 8.72 ml    LBM: Last BM Date:  (PTA) Baseline Weight: Weight: 69.5 kg Most recent weight: Weight: 69.5 kg     Palliative Assessment/Data:   Flowsheet Rows    Flowsheet Row Most Recent Value  Intake Tab   Referral Department Hospitalist  Unit at Time of Referral ICU  Palliative Care Primary Diagnosis Cancer  Date Notified 07/12/2021  Palliative Care Type Return patient Palliative Care  Reason for referral Clarify Goals of Care, End of Life Care Assistance  Date of Admission 07/06/2021  Date first seen by Palliative Care 07-22-2021  # of days Palliative referral response time 1 Day(s)  # of days IP prior to Palliative referral 0  Clinical Assessment   Palliative  Performance Scale Score 10%  Psychosocial & Spiritual Assessment   Palliative Care Outcomes   Patient/Family meeting held? Yes  Who was at the meeting? Mother via phone, brother       Time In: 85 Time Out: 1220 Time Total: 56 Greater than 50%  of this time was spent counseling and coordinating care related to the above assessment and plan.  Signed by: Micheline Rough, MD   Please contact Palliative Medicine Team phone at [phone] for questions and concerns.  For individual provider: See Shea Evans

## 2021-08-05 NOTE — Progress Notes (Addendum)
   NAME:  Rebecca [name], MRN:  [id number], DOB:  [birth year], LOS: 1 ADMISSION DATE:  07/27/2021, CONSULTATION DATE:  07/31/2021 REFERRING MD:  Dr Marylyn Ishihara, CHIEF COMPLAINT:  sepsis   History of Present Illness:  Patient was brought to the hospital with weakness and confusion  Patient with a history of breast cancer, recently with discussions regarding hospice care Metastatic breast cancer, multiple courses of chemotherapy, radiation treatment. Has not been eating, not drinking, increasingly confused Was living with her mom and her mom could no longer take care of her  Pertinent  Medical History   Past Medical History:  Diagnosis Date   Anxiety    Breast cancer ([place])    Depression    GERD (gastroesophageal reflux disease)    History of radiation therapy 11/15/16-01/12/17   right chest wall and axilla treated to 45 Gy in 25 fractions, boosted and additional 14 Gy in 8 fractions   Hypertension    diet controlled   Obesity (BMI 35.0-39.9 without comorbidity)    Pneumonia    as a child   Seasonal allergies    Sickle cell trait ([place])    Termination of pregnancy (fetus) 04/02/16     Significant Hospital Events: Including procedures, antibiotic start and stop dates in addition to other pertinent events   More comfortable with pain regimen  Interim History / Subjective:   Appears more comfortable this morning, family members in presence  Objective   Blood pressure 112/63, pulse 100, temperature (!) 97.4 F (36.3 C), temperature source Axillary, resp. rate 20, SpO2 100 %.        Intake/Output Summary (Last 24 hours) at 07-19-2021 1431 Last data filed at 07-19-21 1009 Gross per 24 hour  Intake 4082.5 ml  Output 0 ml  Net 4082.5 ml   There were no vitals filed for this visit.  Examination: General: Chronically ill-appearing HENT: Dry oral mucosa Lungs: Decreased air movement posteriorly, extensive necrosis right chest wall, mass lesions all over left breast   Cardiovascular: S1-S2 appreciated Abdomen: Bowel sounds appreciated Extremities: Edema Neuro: Sedate GU:   Resolved Hospital Problem list     Assessment & Plan:  Appreciate consultants participating in her care  Patient transitioned to comfort measures Hospice care  Plan will be transfer for inpatient hospice at [place], [place] PCCM Pager: See Shea Evans

## 2021-08-05 NOTE — TOC Initial Note (Signed)
Transition of Care [facility]) - Initial/Assessment Note    Patient Details  Name: Rebecca [name] MRN: [id number] Date of Birth: 30-Mar-1976  Transition of Care [facility]) CM/SW Contact:    Rebecca Cha, RN Phone Number: 07-31-21, 9:10 AM  Clinical Narrative:                 45 y.o. female with medical history significant of breast cancer , HTN, depression. History is from mother as patient is confused and not fully participating in interview/exam. She lives with her mother. Per her mother's report, the patient has stopped eating. She still will drink fluids, but refused to eat at this point. She had been in more pain over the last several days. During that same time she has become more confused. Her condition has deteriorated to such that she is unable to be cared for at home. Her mother had her brought to the ED for assistance.     ED Course: Lab work showed elevated WBC, elevated Scr, hypoglycemia. She was started on vanc. EDP spoke with onco. TRH was called for admission.    Review of Systems:  Unable to obtain d/t mentation.    PMHx     Past Medical History:  Diagnosis Date   Anxiety     Breast cancer ([place])     Depression     GERD (gastroesophageal reflux disease)     History of radiation therapy 11/15/16-01/12/17    right chest wall and axilla treated to 45 Gy in 25 fractions, boosted and additional 14 Gy in 8 fractions   Hypertension      diet controlled   Obesity (BMI 35.0-39.9 without comorbidity)     Pneumonia      as a child   Seasonal allergies     Sickle cell trait ([place])     Termination of pregnancy (fetus) 04/02/16   due to history and present condition, patient has been made a DNR. Wbc 22.0, hgb 7.9-1 unit ffp and t and s for prbc, iv abx, bun 33 creat 1.75, liver enz elevated, anion gap -28 blood glucose 157. Following for toc needs and progression.       Patient Goals and CMS Choice        Expected Discharge Plan and Services            Expected Discharge Date:  (unknown)                                    Prior Living Arrangements/Services                       Activities of Daily Living Home Assistive Devices/Equipment: Bedside commode/3-in-1, Oxygen, Walker (specify type) (front wheeled walker) ADL Screening (condition at time of admission) Patient's cognitive ability adequate to safely complete daily activities?: Yes Is the patient deaf or have difficulty hearing?: No Does the patient have difficulty seeing, even when wearing glasses/contacts?: No Does the patient have difficulty concentrating, remembering, or making decisions?: No Patient able to express need for assistance with ADLs?: Yes Does the patient have difficulty dressing or bathing?: Yes Independently performs ADLs?: No Communication: Independent Dressing (OT): Needs assistance Is this a change from baseline?: Pre-admission baseline Grooming: Independent Feeding: Independent Bathing: Needs assistance Is this a change from baseline?: Pre-admission baseline Toileting: Needs assistance Is this a change from baseline?: Pre-admission baseline In/Out Bed: Needs assistance Is this a  change from baseline?: Pre-admission baseline Walks in Home: Needs assistance Is this a change from baseline?: Pre-admission baseline Does the patient have difficulty walking or climbing stairs?: Yes (secondary to weakness) Weakness of Legs: Both Weakness of Arms/Hands: None  Permission Sought/Granted                  Emotional Assessment              Admission diagnosis:  Weakness [R53.1] Sepsis ([place]) [A41.9] Patient Active Problem List   Diagnosis Date Noted   Pressure injury of skin 06/22/2021   Severe anemia 06/18/2021   Symptomatic anemia 06/17/2021   GERD without esophagitis 06/17/2021   Cellulitis of finger of left hand 06/17/2021   Anasarca 06/17/2021   Neutropenic fever ([place]) 08/14/2020   Sepsis ([place]) 08/14/2020   Acute lower  UTI 08/14/2020   Open chest wound, right, initial encounter 08/14/2020   Anemia associated with chemotherapy 08/14/2020   Neuropathy due to chemotherapeutic drug ([place]) 07/10/2019   Recurrent breast adenocarcinoma ([place]) 07/02/2019   Bone metastases ([place]) 07/02/2019   Goals of care, counseling/discussion 07/02/2019   Morbid obesity with body mass index (BMI) of 40.0 to 44.9 in adult [facility]) 04/10/2018   Genetic testing 11/20/2016   Family history of breast cancer 11/20/2016   Malignant neoplasm of upper-outer quadrant of right breast in female, estrogen receptor negative ([place]) 02/04/2016   Hirsutism 01/13/2016   Tobacco dependence 01/13/2016   Irregular periods/menstrual cycles 01/13/2016   PCP:  Rebecca Dew, FNP Pharmacy:   [id number] [place], [id number]: [phone]: [phone]    Social Determinants of Health (SDOH) Interventions    Readmission Risk Interventions No flowsheet data found.

## 2021-08-05 NOTE — Progress Notes (Addendum)
[facility] [address] [employer] [facility]) Hospital Liaison Note   Addendum: This liaison received a call from pt's mother who advised pt passed not long ago.  [facility] will touch base with pt's family later this evening to offer support.   Ms Dorise Hiss is a current hospice pt with [facility], admitted with a terminal diagnosis of breast cancer.  Per Dr. Tomasa Hosteller, [facility] MD, this is a related admission.  Pt has been deemed appropriate for [facility] by Dr. Tomasa Hosteller.  Spoke with pt's mother who verbalizes desire to transfer pt when pt stable.  Per conversation with Dr. Domingo Cocking, PMT, pt not stable at this time to transfer             [facility] liaisons will continue to follow to assist in transfer to [facility] and to support this pt and family in any way possible.  Thank you for the opportunity to participate in this patient's care.     Domenic Moras, BSN, RN [facility] Liaison [phone] (24h on call)

## 2021-08-05 NOTE — Discharge Summary (Signed)
Physician Discharge Summary  Rebecca [name] [id number] DOB: 09-03-1976 DOA: 07/12/2021  PCP: Dorena Dew, FNP  Admit date: 07/18/2021 Discharge date: 2021/08/05  Admitted From: home Disposition:  hospice  Recommendations for Outpatient Follow-up:  Inpatient hospice/[facility]  Home Health:no  Equipment/Devices: none  Discharge Condition: Stable Code Status:   Code Status: DNR Diet recommendation:  Diet Order             Diet full liquid Room service appropriate? Yes; Fluid consistency: Thin  Diet effective now                    Brief/Interim Summary: 45 y.o. female with medical history significant for metastatic breast cancer with mets to bone and lymph node chest wall followed by Dr. [name] has had surgery, chemotherapy and radiotherapy multiple courses recently discharged from cancer center for medical after she had exhausted all chemotherapeutic options recently admitted for symptomatic anemia bleeding from the breast cancer and discharged to home with hospice with hx of  HTN, tachycardia, depression brought to the ED with multiple complaints, as per her mother's report, the patient has stopped eating. She still will drink fluids, but refused to eat at this point. She had been in more pain over the last several days. During that same time she has become more confused. Her condition has deteriorated to such that she is unable to be cared for at home. Her mother had her brought to the ED for assistance.    ED Course: Lab work showed leukocytosis 21k, AKI with creatinine 1.7, hypoglycemia, anemia, thrombocytopenia 24k, transaminitis. She was started on antibiotics occlusively was consulted and patient was admitted for severe sepsis due to infected chest wound, AKI, leukocytosis metabolic acidosis pancytopenia anemia hypoglycemia hypothermia. Patient was admitted to stepdown and seen by critical care.  Patient was felt to have end-stage neoplastic process.   Palliative care was consulted. After discussion with patient's family patient was transitioned to comfort measures 8/10 and planning on inpatient hospice  Discharge Diagnoses:   End-of-life care: Continue current comfort measures appreciate palliative care input, dilaudid drip for comfort.  Severe sepsis due to infected chest wound Extensive chest wall wound due to skin necrosis from metastatic cancer AKI Lactic acidosis Leukocytosis  Thrombocytopenia  Anemia  Transaminitis Normocytic anemia with chronic bleeding from the chest wall cancer Failure to thrive  Plan is to continue on comfort measures and transfer to inpatient hospice house once bed available Discussed with patient's brother at the bedside  Consults: PCCM Oncology Palliative care  Subjective: Resting with family was at the bedside Discharge Exam: Vitals:   08-05-2021 1200 Aug 05, 2021 1300  BP:    Pulse: (!) 101 100  Resp: (!) 21 20  Temp:    SpO2: 98% 100%   General: Pt is alert, awake, not in acute distress Cardiovascular: RRR, S1/S2 +, no rubs, no gallops Respiratory: CTA bilaterally, no wheezing, no rhonchi Abdominal: Soft, NT, ND, bowel sounds + Extremities: no edema, no cyanosis  Discharge Instructions  Discharge Instructions     Discharge wound care:   Complete by: As directed    Wound dressing for comfort      Allergies as of 08-05-2021   No Known Allergies      Medication List     STOP taking these medications    amoxicillin-clavulanate 875-125 MG tablet Commonly known as: Augmentin   gabapentin 300 MG capsule Commonly known as: NEURONTIN   gelatin adsorbable 12-7 MM sponge Commonly known as: GELFOAM/SURGIFOAM  methadone 5 MG tablet Commonly known as: DOLOPHINE   metoprolol tartrate 25 MG tablet Commonly known as: LOPRESSOR   metroNIDAZOLE 500 MG tablet Commonly known as: Flagyl   naproxen sodium 220 MG tablet Commonly known as: ALEVE   ondansetron 4 MG disintegrating  tablet Commonly known as: Zofran ODT   oxyCODONE 5 MG immediate release tablet Commonly known as: Oxy IR/ROXICODONE   PEG 3350 17 GM/SCOOP Powd   polyethylene glycol 17 g packet Commonly known as: MIRALAX / GLYCOLAX   senna-docusate 8.6-50 MG tablet Commonly known as: Senokot-S       TAKE these medications    LORazepam 0.5 MG tablet Commonly known as: ATIVAN Take 1 tablet (0.5 mg total) by mouth daily as needed for anxiety.               Discharge Care Instructions  (From admission, onward)           Start     Ordered   July 21, 2021 0000  Discharge wound care:       Comments: Wound dressing for comfort   07-21-2021 1327            No Known Allergies  The results of significant diagnostics from this hospitalization (including imaging, microbiology, ancillary and laboratory) are listed below for reference.    Microbiology: Recent Results (from the past 240 hour(s))  Resp Panel by RT-PCR (Flu A&B, Covid) Nasopharyngeal Swab     Status: None   Collection Time: 07/29/2021  3:20 PM   Specimen: Nasopharyngeal Swab; Nasopharyngeal(NP) swabs in vial transport medium  Result Value Ref Range Status   SARS Coronavirus 2 by RT PCR NEGATIVE NEGATIVE Final    Comment: (NOTE) SARS-CoV-2 target nucleic acids are NOT DETECTED.  The SARS-CoV-2 RNA is generally detectable in upper respiratory specimens during the acute phase of infection. The lowest concentration of SARS-CoV-2 viral copies this assay can detect is 138 copies/mL. A negative result does not preclude SARS-Cov-2 infection and should not be used as the sole basis for treatment or other patient management decisions. A negative result may occur with  improper specimen collection/handling, submission of specimen other than nasopharyngeal swab, presence of viral mutation(s) within the areas targeted by this assay, and inadequate number of viral copies(<138 copies/mL). A negative result must be combined  with clinical observations, patient history, and epidemiological information. The expected result is Negative.  Fact Sheet for Patients:  EntrepreneurPulse.com.au  Fact Sheet for Healthcare Providers:  IncredibleEmployment.be  This test is no t yet approved or cleared by the [place] FDA and  has been authorized for detection and/or diagnosis of SARS-CoV-2 by FDA under an Emergency Use Authorization (EUA). This EUA will remain  in effect (meaning this test can be used) for the duration of the COVID-19 declaration under Section 564(b)(1) of the Act, 21 U.S.C.section 360bbb-3(b)(1), unless the authorization is terminated  or revoked sooner.       Influenza A by PCR NEGATIVE NEGATIVE Final   Influenza B by PCR NEGATIVE NEGATIVE Final    Comment: (NOTE) The Xpert Xpress SARS-CoV-2/FLU/RSV plus assay is intended as an aid in the diagnosis of influenza from Nasopharyngeal swab specimens and should not be used as a sole basis for treatment. Nasal washings and aspirates are unacceptable for Xpert Xpress SARS-CoV-2/FLU/RSV testing.  Fact Sheet for Patients: EntrepreneurPulse.com.au  Fact Sheet for Healthcare Providers: IncredibleEmployment.be  This test is not yet approved or cleared by the [place] FDA and has been authorized for detection and/or diagnosis of  SARS-CoV-2 by FDA under an Emergency Use Authorization (EUA). This EUA will remain in effect (meaning this test can be used) for the duration of the COVID-19 declaration under Section 564(b)(1) of the Act, 21 U.S.C. section 360bbb-3(b)(1), unless the authorization is terminated or revoked.  Performed at [facility], [place] [address], [place], [place] [address]   MRSA Next Gen by PCR, Nasal     Status: None   Collection Time: 07/19/2021  6:12 PM   Specimen: Nasal Mucosa; Nasal Swab  Result Value Ref Range Status   MRSA by PCR  Next Gen NOT DETECTED NOT DETECTED Final    Comment: (NOTE) The GeneXpert MRSA Assay (FDA approved for NASAL specimens only), is one component of a comprehensive MRSA colonization surveillance program. It is not intended to diagnose MRSA infection nor to guide or monitor treatment for MRSA infections. Test performance is not FDA approved in patients less than 29 years old. Performed at [facility], [place] [address] [place], [place] [address]     Procedures/Studies: DG Chest Port 1 View  Result Date: 07/12/2021 CLINICAL DATA:  Shortness of breath. History of breast cancer per prior radiology records. EXAM: PORTABLE CHEST 1 VIEW COMPARISON:  CT chest 10/06/2020. Prior chest radiographs 08/14/2020 and earlier. FINDINGS: Left chest infusion port catheter with tip projecting at the level of the superior cavoatrial junction. Shallow inspiration radiograph. Heart size within normal limits. Fullness of the right hilum, which may reflect previously demonstrated right hilar lymphadenopathy. No appreciable airspace consolidation or pulmonary edema. No evidence of pleural effusion or pneumothorax. No acute bony abnormality identified. IMPRESSION: Shallow inspiration radiograph. No appreciable airspace consolidation or pulmonary edema. Fullness of the right hilum, which may reflect previously demonstrated right hilar lymphadenopathy. Electronically Signed   By: Kellie Simmering DO   On: 07/17/2021 13:01    Labs: BNP (last 3 results) No results for input(s): BNP in the last 8760 hours. Basic Metabolic Panel: Recent Labs  Lab 07/24/2021 1218 08/04/2021 2358 2021/08/07 0211 August 07, 2021 0406 2021/08/07 1018  NA 141  --   --  140  --   K 4.6  --   --  4.8  --   CL 101  --   --  103  --   CO2 11*  --   --  9*  --   GLUCOSE 62* 111* 140* 157* 214*  BUN 29*  --   --  33*  --   CREATININE 1.67*  --   --  1.75*  --   CALCIUM 7.6*  --   --  6.7*  --    Liver Function Tests: Recent Labs  Lab  07/11/2021 1218 2021-08-07 0406  AST 358* 553*  ALT 103* 101*  ALKPHOS 677* 526*  BILITOT 3.3* 3.9*  PROT 5.5* 4.5*  ALBUMIN 1.7* 1.4*   No results for input(s): LIPASE, AMYLASE in the last 168 hours. No results for input(s): AMMONIA in the last 168 hours. CBC: Recent Labs  Lab 07/25/2021 1218 07/26/2021 2358 2021-08-07 0406  WBC 18.2* 20.6* 22.0*  NEUTROABS 17.1*  --   --   HGB 7.8* 7.8* 7.9*  HCT 24.5* 24.4* 24.9*  MCV 91.8 90.0 89.2  PLT 35* 24* 25*   Cardiac Enzymes: No results for input(s): CKTOTAL, CKMB, CKMBINDEX, TROPONINI in the last 168 hours. BNP: Invalid input(s): POCBNP CBG: No results for input(s): GLUCAP in the last 168 hours. D-Dimer No results for input(s): DDIMER in the last 72 hours. Hgb A1c No results  for input(s): HGBA1C in the last 72 hours. Lipid Profile No results for input(s): CHOL, HDL, LDLCALC, TRIG, CHOLHDL, LDLDIRECT in the last 72 hours. Thyroid function studies No results for input(s): TSH, T4TOTAL, T3FREE, THYROIDAB in the last 72 hours.  Invalid input(s): FREET3 Anemia work up No results for input(s): VITAMINB12, FOLATE, FERRITIN, TIBC, IRON, RETICCTPCT in the last 72 hours. Urinalysis    Component Value Date/Time   COLORURINE YELLOW 08/14/2020 1435   APPEARANCEUR CLOUDY (A) 08/14/2020 1435   LABSPEC 1.012 08/14/2020 1435   PHURINE 5.0 08/14/2020 1435   GLUCOSEU NEGATIVE 08/14/2020 1435   HGBUR NEGATIVE 08/14/2020 1435   BILIRUBINUR NEGATIVE 08/14/2020 1435   KETONESUR NEGATIVE 08/14/2020 1435   PROTEINUR 100 (A) 08/14/2020 1435   UROBILINOGEN 0.2 01/12/2016 1122   NITRITE NEGATIVE 08/14/2020 1435   LEUKOCYTESUR LARGE (A) 08/14/2020 1435   Sepsis Labs Invalid input(s): PROCALCITONIN,  WBC,  LACTICIDVEN Microbiology Recent Results (from the past 240 hour(s))  Resp Panel by RT-PCR (Flu A&B, Covid) Nasopharyngeal Swab     Status: None   Collection Time: 08/04/2021  3:20 PM   Specimen: Nasopharyngeal Swab; Nasopharyngeal(NP) swabs in  vial transport medium  Result Value Ref Range Status   SARS Coronavirus 2 by RT PCR NEGATIVE NEGATIVE Final    Comment: (NOTE) SARS-CoV-2 target nucleic acids are NOT DETECTED.  The SARS-CoV-2 RNA is generally detectable in upper respiratory specimens during the acute phase of infection. The lowest concentration of SARS-CoV-2 viral copies this assay can detect is 138 copies/mL. A negative result does not preclude SARS-Cov-2 infection and should not be used as the sole basis for treatment or other patient management decisions. A negative result may occur with  improper specimen collection/handling, submission of specimen other than nasopharyngeal swab, presence of viral mutation(s) within the areas targeted by this assay, and inadequate number of viral copies(<138 copies/mL). A negative result must be combined with clinical observations, patient history, and epidemiological information. The expected result is Negative.  Fact Sheet for Patients:  EntrepreneurPulse.com.au  Fact Sheet for Healthcare Providers:  IncredibleEmployment.be  This test is no t yet approved or cleared by the [place] FDA and  has been authorized for detection and/or diagnosis of SARS-CoV-2 by FDA under an Emergency Use Authorization (EUA). This EUA will remain  in effect (meaning this test can be used) for the duration of the COVID-19 declaration under Section 564(b)(1) of the Act, 21 U.S.C.section 360bbb-3(b)(1), unless the authorization is terminated  or revoked sooner.       Influenza A by PCR NEGATIVE NEGATIVE Final   Influenza B by PCR NEGATIVE NEGATIVE Final    Comment: (NOTE) The Xpert Xpress SARS-CoV-2/FLU/RSV plus assay is intended as an aid in the diagnosis of influenza from Nasopharyngeal swab specimens and should not be used as a sole basis for treatment. Nasal washings and aspirates are unacceptable for Xpert Xpress SARS-CoV-2/FLU/RSV testing.  Fact  Sheet for Patients: EntrepreneurPulse.com.au  Fact Sheet for Healthcare Providers: IncredibleEmployment.be  This test is not yet approved or cleared by the [place] FDA and has been authorized for detection and/or diagnosis of SARS-CoV-2 by FDA under an Emergency Use Authorization (EUA). This EUA will remain in effect (meaning this test can be used) for the duration of the COVID-19 declaration under Section 564(b)(1) of the Act, 21 U.S.C. section 360bbb-3(b)(1), unless the authorization is terminated or revoked.  Performed at [facility], [place] [address] [place], [place] [address]   MRSA Next Gen by PCR, Nasal  Status: None   Collection Time: 07/10/2021  6:12 PM   Specimen: Nasal Mucosa; Nasal Swab  Result Value Ref Range Status   MRSA by PCR Next Gen NOT DETECTED NOT DETECTED Final    Comment: (NOTE) The GeneXpert MRSA Assay (FDA approved for NASAL specimens only), is one component of a comprehensive MRSA colonization surveillance program. It is not intended to diagnose MRSA infection nor to guide or monitor treatment for MRSA infections. Test performance is not FDA approved in patients less than 2 years old. Performed at [facility], [place] [address] [place],  [address]    Time coordinating discharge: 35 minutes  SIGNED: Antonieta Pert, MD  [facility] 2021-07-23, 1:27 PM  If 7PM-7AM, please contact night-coverage [email or web address]

## 2021-08-05 DEATH — deceased

## 2021-08-11 ENCOUNTER — Ambulatory Visit: Payer: Self-pay | Admitting: Urology

## 2021-09-12 NOTE — Progress Notes (Signed)
  Radiation Oncology         [phone] ________________________________  Name: Rebecca [name] MRN: [id number]  Date: 06/29/2021  DOB: [birth year]  End of Treatment Note  Diagnosis:   45 yo woman with an actively bleeding locally recurrent right breast cancer in the previously irradiated right chest wall.     Indication for treatment:  Palliation       Radiation treatment dates:   7/20-26/22  Site/dose:   The recurrent breast mass was treated to 20 Gy in 5 fractions  Beams/energy:   5-fields were used with 6 MV X-rays  Narrative: The patient tolerated radiation treatment relatively well.     Plan: The patient has completed radiation treatment.  ________________________________  Sheral Apley. Tammi Klippel, M.D.

## 2023-09-19 NOTE — Telephone Encounter (Signed)
TC

## 2023-10-25 NOTE — Telephone Encounter (Signed)
Telephone call
